# Patient Record
Sex: Female | Born: 1955
Health system: Southern US, Community
[De-identification: ages and names within clinical notes are randomized; demographics above are authoritative.]

## PROBLEM LIST (undated history)

## (undated) DIAGNOSIS — Z8619 Personal history of other infectious and parasitic diseases: Secondary | ICD-10-CM

## (undated) DIAGNOSIS — J449 Chronic obstructive pulmonary disease, unspecified: Secondary | ICD-10-CM

## (undated) DIAGNOSIS — J9611 Chronic respiratory failure with hypoxia: Secondary | ICD-10-CM

## (undated) DIAGNOSIS — E785 Hyperlipidemia, unspecified: Secondary | ICD-10-CM

## (undated) DIAGNOSIS — N200 Calculus of kidney: Secondary | ICD-10-CM

## (undated) DIAGNOSIS — F17210 Nicotine dependence, cigarettes, uncomplicated: Secondary | ICD-10-CM

## (undated) DIAGNOSIS — R233 Spontaneous ecchymoses: Secondary | ICD-10-CM

## (undated) DIAGNOSIS — Z9981 Dependence on supplemental oxygen: Secondary | ICD-10-CM

## (undated) DIAGNOSIS — F418 Other specified anxiety disorders: Secondary | ICD-10-CM

## (undated) DIAGNOSIS — I272 Pulmonary hypertension, unspecified: Secondary | ICD-10-CM

## (undated) DIAGNOSIS — J9811 Atelectasis: Secondary | ICD-10-CM

## (undated) DIAGNOSIS — K219 Gastro-esophageal reflux disease without esophagitis: Secondary | ICD-10-CM

## (undated) DIAGNOSIS — N809 Endometriosis, unspecified: Secondary | ICD-10-CM

## (undated) DIAGNOSIS — I5189 Other ill-defined heart diseases: Secondary | ICD-10-CM

## (undated) DIAGNOSIS — E559 Vitamin D deficiency, unspecified: Secondary | ICD-10-CM

## (undated) HISTORY — DX: Gastro-esophageal reflux disease without esophagitis: K21.9

## (undated) HISTORY — PX: TONSILLECTOMY: SUR1361

## (undated) HISTORY — DX: Hyperlipidemia, unspecified: E78.5

## (undated) HISTORY — PX: KIDNEY STONE SURGERY: SHX686

## (undated) HISTORY — DX: Endometriosis, unspecified: N80.9

---

## 2004-12-21 ENCOUNTER — Ambulatory Visit: Payer: Self-pay | Admitting: Internal Medicine

## 2005-03-13 ENCOUNTER — Emergency Department: Payer: Self-pay | Admitting: General Practice

## 2005-06-20 ENCOUNTER — Ambulatory Visit: Payer: Self-pay | Admitting: Urology

## 2005-09-18 ENCOUNTER — Other Ambulatory Visit: Payer: Self-pay

## 2005-09-18 ENCOUNTER — Ambulatory Visit: Payer: Self-pay | Admitting: Unknown Physician Specialty

## 2005-12-25 ENCOUNTER — Ambulatory Visit: Payer: Self-pay | Admitting: Urology

## 2007-01-20 ENCOUNTER — Ambulatory Visit: Payer: Self-pay | Admitting: Unknown Physician Specialty

## 2007-06-18 ENCOUNTER — Ambulatory Visit: Payer: Self-pay | Admitting: Urology

## 2007-06-25 ENCOUNTER — Ambulatory Visit: Payer: Self-pay | Admitting: Internal Medicine

## 2007-09-23 ENCOUNTER — Ambulatory Visit: Payer: Self-pay | Admitting: Urology

## 2008-06-20 ENCOUNTER — Ambulatory Visit: Payer: Self-pay | Admitting: Urology

## 2008-10-05 ENCOUNTER — Ambulatory Visit: Payer: Self-pay | Admitting: Urology

## 2008-12-12 ENCOUNTER — Ambulatory Visit: Payer: Self-pay | Admitting: General Surgery

## 2008-12-16 ENCOUNTER — Ambulatory Visit: Payer: Self-pay | Admitting: General Surgery

## 2010-03-07 ENCOUNTER — Ambulatory Visit: Payer: Self-pay | Admitting: Internal Medicine

## 2011-11-12 ENCOUNTER — Ambulatory Visit: Payer: Self-pay | Admitting: Internal Medicine

## 2012-06-02 DIAGNOSIS — I10 Essential (primary) hypertension: Secondary | ICD-10-CM | POA: Insufficient documentation

## 2013-07-02 ENCOUNTER — Ambulatory Visit: Payer: Self-pay | Admitting: Cardiology

## 2013-12-02 ENCOUNTER — Ambulatory Visit: Payer: Self-pay | Admitting: Internal Medicine

## 2014-03-08 DIAGNOSIS — E785 Hyperlipidemia, unspecified: Secondary | ICD-10-CM | POA: Insufficient documentation

## 2014-03-08 DIAGNOSIS — J45909 Unspecified asthma, uncomplicated: Secondary | ICD-10-CM | POA: Insufficient documentation

## 2014-03-08 DIAGNOSIS — I272 Pulmonary hypertension, unspecified: Secondary | ICD-10-CM | POA: Insufficient documentation

## 2014-03-08 HISTORY — DX: Unspecified asthma, uncomplicated: J45.909

## 2014-12-13 ENCOUNTER — Encounter: Payer: Self-pay | Admitting: Internal Medicine

## 2014-12-26 ENCOUNTER — Encounter: Payer: Self-pay | Admitting: Internal Medicine

## 2015-01-16 ENCOUNTER — Ambulatory Visit: Payer: Self-pay | Admitting: Physician Assistant

## 2015-01-18 DIAGNOSIS — R079 Chest pain, unspecified: Secondary | ICD-10-CM | POA: Insufficient documentation

## 2015-01-18 DIAGNOSIS — R0602 Shortness of breath: Secondary | ICD-10-CM | POA: Insufficient documentation

## 2015-01-24 ENCOUNTER — Encounter
Admit: 2015-01-24 | Disposition: A | Discharge status: Home / Self Care | Payer: Self-pay | Attending: Internal Medicine | Admitting: Internal Medicine

## 2015-01-27 ENCOUNTER — Ambulatory Visit: Payer: Self-pay | Admitting: Internal Medicine

## 2015-02-09 ENCOUNTER — Ambulatory Visit: Payer: Self-pay | Admitting: Internal Medicine

## 2015-02-24 ENCOUNTER — Encounter
Admit: 2015-02-24 | Disposition: A | Discharge status: Home / Self Care | Payer: Self-pay | Attending: Internal Medicine | Admitting: Internal Medicine

## 2015-03-27 ENCOUNTER — Ambulatory Visit: Payer: Self-pay

## 2015-03-29 ENCOUNTER — Ambulatory Visit: Payer: Self-pay

## 2015-03-31 ENCOUNTER — Ambulatory Visit: Payer: Self-pay

## 2015-04-03 ENCOUNTER — Ambulatory Visit: Payer: Self-pay

## 2015-04-05 ENCOUNTER — Ambulatory Visit: Payer: Self-pay

## 2015-04-07 ENCOUNTER — Ambulatory Visit: Payer: Self-pay

## 2015-04-10 ENCOUNTER — Ambulatory Visit: Payer: Self-pay

## 2015-04-10 ENCOUNTER — Telehealth: Payer: Self-pay

## 2015-04-10 NOTE — Telephone Encounter (Signed)
Home phone is disconnected, voicemail is full on cell phone and cannot leave message.

## 2015-04-11 NOTE — Telephone Encounter (Signed)
Returned call from Ms Weissberg, she has multiple physician appointments and will be out a few weeks, but does want her account open and plans to return to [place].

## 2015-04-12 ENCOUNTER — Ambulatory Visit: Payer: Self-pay

## 2015-04-14 ENCOUNTER — Ambulatory Visit: Payer: Self-pay

## 2015-04-17 ENCOUNTER — Ambulatory Visit: Payer: Self-pay

## 2015-04-17 ENCOUNTER — Encounter: Payer: Self-pay | Admitting: Respiratory Therapy

## 2015-04-17 DIAGNOSIS — J449 Chronic obstructive pulmonary disease, unspecified: Secondary | ICD-10-CM

## 2015-04-17 NOTE — Progress Notes (Signed)
Pulmonary Individual Treatment Plan  Patient Details  Name: Rachael Blankenship MRN: [id number] Date of Birth: 03/27/1956 Referring Provider:  No ref. provider found  Initial Encounter Date:    Visit Diagnosis: No diagnosis found.  Patient's Home Medications on Admission: No current outpatient prescriptions on file.  Past Medical History: No past medical history on file.  Tobacco Use: History  Smoking status   Not on file  Smokeless tobacco   Not on file    Labs: Recent Review Flowsheet Data    There is no flowsheet data to display.       ADL UCSD:    Pulmonary Function Assessment:   Exercise Target Goals:    Exercise Program Goal: Individual exercise prescription set with THRR, safety & activity barriers. Participant demonstrates ability to understand and report RPE using BORG scale, to self-measure pulse accurately, and to acknowledge the importance of the exercise prescription.  Exercise Prescription Goal: Starting with aerobic activity 30 plus minutes a day, 3 days per week for initial exercise prescription. Provide home exercise prescription and guidelines that participant acknowledges understanding prior to discharge.  Activity Barriers & Risk Stratification:   6 Minute Walk:   Initial Exercise Prescription:   Exercise Prescription Changes:   Discharge Exercise Prescription:    Nutrition:  Target Goals: Understanding of nutrition guidelines, daily intake of sodium 1500mg , cholesterol 200mg , calories 30% from fat and 7% or less from saturated fats, daily to have 5 or more servings of fruits and vegetables.  Biometrics:    Nutrition Therapy Plan and Nutrition Goals:   Nutrition Discharge: Rate Your Plate Scores:   Psychosocial: Target Goals: Acknowledge presence or absence of depression, maximize coping skills, provide positive support system. Participant is able to verbalize types and ability to use techniques and skills needed for  reducing stress and depression.  Initial Review & Psychosocial Screening:   Quality of Life Scores:   PHQ-9: Recent Review Flowsheet Data    There is no flowsheet data to display.      Psychosocial Evaluation and Intervention:   Psychosocial Re-Evaluation:  Education: Education Goals: Education classes will be provided on a weekly basis, covering required topics. Participant will state understanding/return demonstration of topics presented.  Learning Barriers/Preferences:   Education Topics: Initial Evaluation Education: - Verbal, written and demonstration of respiratory meds, RPE/PD scales, oximetry and breathing techniques. Instruction on use of nebulizers and MDIs: cleaning and proper use, rinsing mouth with steroid doses and importance of monitoring MDI activations.   General Nutrition Guidelines/Fats and Fiber: -Group instruction provided by verbal, written material, models and posters to present the general guidelines for heart healthy nutrition. Gives an explanation and review of dietary fats and fiber.   Controlling Sodium/Reading Food Labels: -Group verbal and written material supporting the discussion of sodium use in heart healthy nutrition. Review and explanation with models, verbal and written materials for utilization of the food label.   Exercise Physiology & Risk Factors: - Group verbal and written instruction with models to review the exercise physiology of the cardiovascular system and associated critical values. Details cardiovascular disease risk factors and the goals associated with each risk factor.   Aerobic Exercise & Resistance Training: - Gives group verbal and written discussion on the health impact of inactivity. On the components of aerobic and resistive training programs and the benefits of this training and how to safely progress through these programs.   Flexibility, Balance, General Exercise Guidelines: - Provides group verbal and  written instruction on the benefits of  flexibility and balance training programs. Provides general exercise guidelines with specific guidelines to those with heart or lung disease. Demonstration and skill practice provided.   Stress Management: - Provides group verbal and written instruction about the health risks of elevated stress, cause of high stress, and healthy ways to reduce stress.   Depression: - Provides group verbal and written instruction on the correlation between heart/lung disease and depressed mood, treatment options, and the stigmas associated with seeking treatment.   Exercise & Equipment Safety: - Individual verbal instruction and demonstration of equipment use and safety with use of the equipment.   Infection Prevention: - Provides verbal and written material to individual with discussion of infection control including proper hand washing and proper equipment cleaning during exercise session.   Falls Prevention: - Provides verbal and written material to individual with discussion of falls prevention and safety.   Diabetes: - Individual verbal and written instruction to review signs/symptoms of diabetes, desired ranges of glucose level fasting, after meals and with exercise. Advice that pre and post exercise glucose checks will be done for 3 sessions at entry of program.   Chronic Lung Diseases: - Group verbal and written instruction to review new updates, new respiratory medications, new advancements in procedures and treatments. Provide informative websites and "800" numbers of self-education.   Lung Procedures: - Group verbal and written instruction to describe testing methods done to diagnose lung disease. Review the outcome of test results. Describe the treatment choices: Pulmonary Function Tests, ABGs and oximetry.   Energy Conservation: - Provide group verbal and written instruction for methods to conserve energy, plan and organize activities. Instruct on  pacing techniques, use of adaptive equipment and posture/positioning to relieve shortness of breath.   Triggers: - Group verbal and written instruction to review types of environmental controls: home humidity, furnaces, filters, dust mite/pet prevention, HEPA vacuums. To discuss weather changes, air quality and the benefits of nasal washing.   Exacerbations: - Group verbal and written instruction to provide: warning signs, infection symptoms, calling MD promptly, preventive modes, and value of vaccinations. Review: effective airway clearance, coughing and/or vibration techniques. Create an Sports administrator.   Oxygen: - Individual and group verbal and written instruction on oxygen therapy. Includes supplement oxygen, available portable oxygen systems, continuous and intermittent flow rates, oxygen safety, concentrators, and Medicare reimbursement for oxygen.   Respiratory Medications: - Group verbal and written instruction to review medications for lung disease. Drug class, frequency, complications, importance of spacers, rinsing mouth after steroid MDI's, and proper cleaning methods for nebulizers.   AED/CPR: - Group verbal and written instruction with the use of models to demonstrate the basic use of the AED with the basic ABC's of resuscitation.   Breathing Retraining: - Provides individuals verbal and written instruction on purpose, frequency, and proper technique of diaphragmatic breathing and pursed-lipped breathing. Applies individual practice skills.   Anatomy and Physiology of the Lungs: - Group verbal and written instruction with the use of models to provide basic lung anatomy and physiology related to function, structure and complications of lung disease.   Heart Failure: - Group verbal and written instruction on the basics of heart failure: signs/symptoms, treatments, explanation of ejection fraction, enlarged heart and cardiomyopathy.   Sleep Apnea: - Individual verbal and  written instruction to review Obstructive Sleep Apnea. Review of risk factors, methods for diagnosing and types of masks and machines for OSA.   Anxiety: - Provides group, verbal and written instruction on the correlation between heart/lung disease and anxiety,  treatment options, and management of anxiety.   Relaxation: - Provides group, verbal and written instruction about the benefits of relaxation for patients with heart/lung disease. Also provides patients with examples of relaxation techniques.   Knowledge Questionnaire Score:   Personal Goals and Risk Factors at Admission:   Personal Goals and Risk Factors Review:    Personal Goals Discharge:    Comments: 30 day review; Ms Dubberly has been ill and has several physician appointments; she plans to be back to [facility] soon. Last attended 03/15/2015.

## 2015-04-19 ENCOUNTER — Ambulatory Visit: Payer: Self-pay

## 2015-04-21 ENCOUNTER — Ambulatory Visit: Payer: Self-pay

## 2015-04-26 ENCOUNTER — Ambulatory Visit: Payer: Self-pay

## 2015-04-27 ENCOUNTER — Other Ambulatory Visit: Payer: Self-pay | Admitting: Internal Medicine

## 2015-04-27 DIAGNOSIS — J189 Pneumonia, unspecified organism: Secondary | ICD-10-CM

## 2015-04-28 ENCOUNTER — Ambulatory Visit: Payer: Self-pay

## 2015-05-01 ENCOUNTER — Ambulatory Visit: Payer: Self-pay

## 2015-05-03 ENCOUNTER — Ambulatory Visit: Payer: Self-pay

## 2015-05-05 ENCOUNTER — Ambulatory Visit: Payer: Self-pay

## 2015-05-08 ENCOUNTER — Ambulatory Visit: Payer: Self-pay

## 2015-05-08 NOTE — Progress Notes (Signed)
Attempted to call, voicemail full on phone.

## 2015-05-10 ENCOUNTER — Ambulatory Visit: Payer: Self-pay

## 2015-05-12 ENCOUNTER — Ambulatory Visit: Payer: Self-pay

## 2015-05-15 ENCOUNTER — Encounter: Payer: Self-pay | Admitting: Internal Medicine

## 2015-05-15 ENCOUNTER — Ambulatory Visit: Payer: Self-pay

## 2015-05-15 DIAGNOSIS — J449 Chronic obstructive pulmonary disease, unspecified: Secondary | ICD-10-CM

## 2015-05-15 NOTE — Progress Notes (Signed)
Pulmonary Individual Treatment Plan  Patient Details  Name: Rachael Blankenship MRN: [id number] Date of Birth: 11/04/1956 Referring Provider:  Dr Devona Konig  Initial Encounter Date: 12/13/14  Visit Diagnosis: COPD  Patient's Home Medications on Admission: No current outpatient prescriptions on file.  Past Medical History: No past medical history on file.  Tobacco Use: History  Smoking status   Not on file  Smokeless tobacco   Not on file    Labs: Recent Review Flowsheet Data    There is no flowsheet data to display.       ADL UCSD:    Pulmonary Function Assessment:   Exercise Target Goals:    Exercise Program Goal: Individual exercise prescription set with THRR, safety & activity barriers. Participant demonstrates ability to understand and report RPE using BORG scale, to self-measure pulse accurately, and to acknowledge the importance of the exercise prescription.  Exercise Prescription Goal: Starting with aerobic activity 30 plus minutes a day, 3 days per week for initial exercise prescription. Provide home exercise prescription and guidelines that participant acknowledges understanding prior to discharge.  Activity Barriers & Risk Stratification:   6 Minute Walk:   Initial Exercise Prescription:   Exercise Prescription Changes:   Discharge Exercise Prescription (Final Exercise Prescription Changes):    Nutrition:  Target Goals: Understanding of nutrition guidelines, daily intake of sodium 1500mg , cholesterol 200mg , calories 30% from fat and 7% or less from saturated fats, daily to have 5 or more servings of fruits and vegetables.  Biometrics:    Nutrition Therapy Plan and Nutrition Goals:   Nutrition Discharge: Rate Your Plate Scores:   Psychosocial: Target Goals: Acknowledge presence or absence of depression, maximize coping skills, provide positive support system. Participant is able to verbalize types and ability to use techniques and  skills needed for reducing stress and depression.  Initial Review & Psychosocial Screening:   Quality of Life Scores:   PHQ-9: Recent Review Flowsheet Data    There is no flowsheet data to display.      Psychosocial Evaluation and Intervention:   Psychosocial Re-Evaluation:  Education: Education Goals: Education classes will be provided on a weekly basis, covering required topics. Participant will state understanding/return demonstration of topics presented.  Learning Barriers/Preferences:   Education Topics: Initial Evaluation Education: - Verbal, written and demonstration of respiratory meds, RPE/PD scales, oximetry and breathing techniques. Instruction on use of nebulizers and MDIs: cleaning and proper use, rinsing mouth with steroid doses and importance of monitoring MDI activations.   General Nutrition Guidelines/Fats and Fiber: -Group instruction provided by verbal, written material, models and posters to present the general guidelines for heart healthy nutrition. Gives an explanation and review of dietary fats and fiber.   Controlling Sodium/Reading Food Labels: -Group verbal and written material supporting the discussion of sodium use in heart healthy nutrition. Review and explanation with models, verbal and written materials for utilization of the food label.   Exercise Physiology & Risk Factors: - Group verbal and written instruction with models to review the exercise physiology of the cardiovascular system and associated critical values. Details cardiovascular disease risk factors and the goals associated with each risk factor.   Aerobic Exercise & Resistance Training: - Gives group verbal and written discussion on the health impact of inactivity. On the components of aerobic and resistive training programs and the benefits of this training and how to safely progress through these programs.   Flexibility, Balance, General Exercise Guidelines: - Provides group  verbal and written instruction on the benefits of  flexibility and balance training programs. Provides general exercise guidelines with specific guidelines to those with heart or lung disease. Demonstration and skill practice provided.   Stress Management: - Provides group verbal and written instruction about the health risks of elevated stress, cause of high stress, and healthy ways to reduce stress.   Depression: - Provides group verbal and written instruction on the correlation between heart/lung disease and depressed mood, treatment options, and the stigmas associated with seeking treatment.   Exercise & Equipment Safety: - Individual verbal instruction and demonstration of equipment use and safety with use of the equipment.   Infection Prevention: - Provides verbal and written material to individual with discussion of infection control including proper hand washing and proper equipment cleaning during exercise session.   Falls Prevention: - Provides verbal and written material to individual with discussion of falls prevention and safety.   Diabetes: - Individual verbal and written instruction to review signs/symptoms of diabetes, desired ranges of glucose level fasting, after meals and with exercise. Advice that pre and post exercise glucose checks will be done for 3 sessions at entry of program.   Chronic Lung Diseases: - Group verbal and written instruction to review new updates, new respiratory medications, new advancements in procedures and treatments. Provide informative websites and "800" numbers of self-education.   Lung Procedures: - Group verbal and written instruction to describe testing methods done to diagnose lung disease. Review the outcome of test results. Describe the treatment choices: Pulmonary Function Tests, ABGs and oximetry.   Energy Conservation: - Provide group verbal and written instruction for methods to conserve energy, plan and organize activities.  Instruct on pacing techniques, use of adaptive equipment and posture/positioning to relieve shortness of breath.   Triggers: - Group verbal and written instruction to review types of environmental controls: home humidity, furnaces, filters, dust mite/pet prevention, HEPA vacuums. To discuss weather changes, air quality and the benefits of nasal washing.   Exacerbations: - Group verbal and written instruction to provide: warning signs, infection symptoms, calling MD promptly, preventive modes, and value of vaccinations. Review: effective airway clearance, coughing and/or vibration techniques. Create an Sports administrator.   Oxygen: - Individual and group verbal and written instruction on oxygen therapy. Includes supplement oxygen, available portable oxygen systems, continuous and intermittent flow rates, oxygen safety, concentrators, and Medicare reimbursement for oxygen.   Respiratory Medications: - Group verbal and written instruction to review medications for lung disease. Drug class, frequency, complications, importance of spacers, rinsing mouth after steroid MDI's, and proper cleaning methods for nebulizers.   AED/CPR: - Group verbal and written instruction with the use of models to demonstrate the basic use of the AED with the basic ABC's of resuscitation.   Breathing Retraining: - Provides individuals verbal and written instruction on purpose, frequency, and proper technique of diaphragmatic breathing and pursed-lipped breathing. Applies individual practice skills.   Anatomy and Physiology of the Lungs: - Group verbal and written instruction with the use of models to provide basic lung anatomy and physiology related to function, structure and complications of lung disease.   Heart Failure: - Group verbal and written instruction on the basics of heart failure: signs/symptoms, treatments, explanation of ejection fraction, enlarged heart and cardiomyopathy.   Sleep Apnea: - Individual  verbal and written instruction to review Obstructive Sleep Apnea. Review of risk factors, methods for diagnosing and types of masks and machines for OSA.   Anxiety: - Provides group, verbal and written instruction on the correlation between heart/lung disease and anxiety,  treatment options, and management of anxiety.   Relaxation: - Provides group, verbal and written instruction about the benefits of relaxation for patients with heart/lung disease. Also provides patients with examples of relaxation techniques.   Knowledge Questionnaire Score:   Personal Goals and Risk Factors at Admission:   Personal Goals and Risk Factors Review:    Personal Goals Discharge:    Comments: Ms Grulke has been out due to illness and other obligations; she last attended 03/15/15; she was called 05/08/15 with no answer and voicemail full.

## 2015-05-16 ENCOUNTER — Ambulatory Visit
Admission: RE | Admit: 2015-05-16 | Discharge: 2015-05-16 | Disposition: A | Discharge status: Home / Self Care | Payer: BLUE CROSS/BLUE SHIELD | Source: Ambulatory Visit | Attending: Internal Medicine | Admitting: Internal Medicine

## 2015-05-16 DIAGNOSIS — J189 Pneumonia, unspecified organism: Secondary | ICD-10-CM | POA: Insufficient documentation

## 2015-05-16 HISTORY — DX: Chronic obstructive pulmonary disease, unspecified: J44.9

## 2015-05-16 HISTORY — DX: Calculus of kidney: N20.0

## 2015-05-16 MED ORDER — IOHEXOL 300 MG/ML  SOLN
75.0000 mL | Freq: Once | INTRAMUSCULAR | Status: AC | PRN
  Administered 2015-05-16: 75 mL via INTRAVENOUS

## 2015-05-17 ENCOUNTER — Ambulatory Visit: Payer: Self-pay

## 2015-05-17 ENCOUNTER — Encounter: Payer: Self-pay | Admitting: Internal Medicine

## 2015-05-17 DIAGNOSIS — J449 Chronic obstructive pulmonary disease, unspecified: Secondary | ICD-10-CM

## 2015-05-17 NOTE — Progress Notes (Signed)
Discharge Summary  Patient Details  Name: Rachael Blankenship MRN: [id number] Date of Birth: [birth year] Referring Provider:  Dr. Devona Konig   Number of Visits: 12  Reason for Discharge:  Early Exit:  Rachael Blankenship called 05/16/15 and requested to be discharged from [facility]. She is caring for her parents and has multiple physician appointments. To continue exercise, she has purchased a Proform Elliptical.  Thank you for the opportunity to work with your patient, Rachael Rachael Blankenship.  Smoking History:  History  Smoking status   Not on file  Smokeless tobacco   Not on file    Diagnosis:  No diagnosis found.  ADL UCSD:     ADL UCSD      12/13/14 1130       ADL UCSD   ADL Phase Entry     SOB Score total 47     Rest 1     Walk 2     Stairs 4     Bath 2     Dress 2     Shop 3        Initial Exercise Prescription:   Discharge Exercise Prescription (Final Exercise Prescription Changes):     Exercise Prescription Changes - 05/17/15 0600    Exercise Review   Progression Yes   Response to Exercise   Blood Pressure (Admit) 118/60 mmHg  03/15/15   Blood Pressure (Exercise) 150/74 mmHg   Blood Pressure (Exit) 122/74 mmHg   Heart Rate (Admit) 87 bpm   Heart Rate (Exercise) 111 bpm   Heart Rate (Exit) 81 bpm   Oxygen Saturation (Admit) 94 %   Oxygen Saturation (Exercise) 96 %   Oxygen Saturation (Exit) 94 %   Rating of Perceived Exertion (Exercise) 13   Perceived Dyspnea (Exercise) 3   Resistance Training   Training Prescription Yes   Weight 3   Reps 10-12   Interval Training   Interval Training No   Treadmill   MPH 1.7   Grade 0   Minutes 15   NuStep   Level 4   Watts 70   Minutes 15   REL-XR   Level 4   Watts 80   Minutes 15      Functional Capacity:     6 Minute Walk      12/13/14 1130       6 Minute Walk   Phase Initial     Distance 1550 feet     Walk Time 6 minutes     Resting HR 70 bpm     Resting BP 124/70 mmHg     Max Ex. HR 88 bpm     Max Ex. BP 136/78 mmHg     RPE 13     Perceived Dyspnea  3        Psychological, QOL, Others - Outcomes: PHQ 2/9: No flowsheet data found.  Quality of Life:   Personal Goals: Goals established at orientation with interventions provided to work toward goal.     Personal Goals and Risk Factors at Admission - 05/17/15 0627    Personal Goals and Risk Factors on Admission    Weight Management Yes   Intervention Learn and follow the exercise and diet guidelines while in the program. Utilize the nutrition and education classes to help gain knowledge of the diet and exercise expectations in the program   Admit Weight 156 lb 9.6 oz (71.033 kg)   Goal Weight 125 lb (56.7 kg)   Increase Aerobic Exercise and Physical Activity  Yes   Intervention While in program, learn and follow the exercise prescription taught. Start at a low level workload and increase workload after able to maintain previous level for 30 minutes. Increase time before increasing intensity.   Understand more about Heart/Pulmonary Disease. Yes   Intervention While in program utilize professionals for any questions, and attend the education sessions. Great websites to use are www.americanheart.org or www.lung.org for reliable information.   Improve shortness of breath with ADL's Yes   Intervention While in program, learn and follow the exercise prescription taught. Start at a low level workload and increase workload ad advised by the exercise physiologist. Increase time before increasing intensity.   Develop more efficient breathing techniques such as purse lipped breathing and diaphragmatic breathing; and practicing self-pacing with activity Yes   Intervention While in program, learn and utilize the specific breathing techniques taught to you. Continue to practice and use the techniques as needed.   Increase knowledge of respiratory medications and ability to use respiratory devices properly.  Yes   Intervention While in program,  learn to administer MDI, nebulizer, and spacer properly.;Learn to take respiratory medicine as ordered.;While in program, learn to Clean MDI, nebulizers, and spacers properly.   Hypertension Yes       Personal Goals Discharge:     Personal Goals at Discharge - 05/17/15 0638    Weight Management   Comments Rachael Blankenship's entry weight was 156.6 and 154.8lb on her last exercise session. She preferred not to meet with the dietitian. She was eatting less candy and more nuts and protein bars.   Increase Aerobic Exercise and Physical Activity   Goals Progress/Improvement seen  Yes   Comments Rachael Blankenship increased her exercise goals on the XR, Treadmill, and NuStep and increased her weights from 2lb to 3lb. She has bought a Proform Eliptical for home exercise.   Improve shortness of breath with ADL's   Goals Progress/Improvement seen  Yes   Comments Rachael Blankenship attended our educational sessions for management of respiratory disease.   Breathing Techniques   Goals Progress/Improvement seen  Yes   Comments Using PLB during exercise and home activities.   Increase knowledge of respiratory medications   Goals Progress/Improvement seen  Yes   Comments Rachael Blankenship has a good understanding of her Dulera, Combivent, Spiriva, and spacer.      Nutrition & Weight - Outcomes:    Nutrition:   Nutrition Discharge:   Education Questionnaire Score:

## 2015-05-17 NOTE — Progress Notes (Signed)
Pulmonary Individual Treatment Plan  Patient Details  Name: Rachael Blankenship MRN: [id number] Date of Birth: 05/21/1956 Referring Provider:  Dr. Devona Konig  Initial Encounter Date: 12/13/14  Visit Diagnosis: COPD, MODERATE  Patient's Home Medications on Admission: No current outpatient prescriptions on file.  Past Medical History: Past Medical History  Diagnosis Date   COPD (chronic obstructive pulmonary disease)    Kidney stones     Tobacco Use: History  Smoking status   Not on file  Smokeless tobacco   Not on file    Labs: Recent Review Flowsheet Data    There is no flowsheet data to display.       ADL [facility]:     ADL [facility]      12/13/14 1130       ADL [facility]   ADL Phase Entry     SOB Score total 47     Rest 1     Walk 2     Stairs 4     Bath 2     Dress 2     Shop 3         Pulmonary Function Assessment:   Exercise Target Goals:    Exercise Program Goal: Individual exercise prescription set with THRR, safety & activity barriers. Participant demonstrates ability to understand and report RPE using BORG scale, to self-measure pulse accurately, and to acknowledge the importance of the exercise prescription.  Exercise Prescription Goal: Starting with aerobic activity 30 plus minutes a day, 3 days per week for initial exercise prescription. Provide home exercise prescription and guidelines that participant acknowledges understanding prior to discharge.  Activity Barriers & Risk Stratification:   6 Minute Walk:     6 Minute Walk      12/13/14 1130       6 Minute Walk   Phase Initial     Distance 1550 feet     Walk Time 6 minutes     Resting HR 70 bpm     Resting BP 124/70 mmHg     Max Ex. HR 88 bpm     Max Ex. BP 136/78 mmHg     RPE 13     Perceived Dyspnea  3        Initial Exercise Prescription:   Exercise Prescription Changes:     Exercise Prescription Changes      05/17/15 0600           Exercise Review   Progression  Yes       Response to Exercise   Blood Pressure (Admit) 118/60 mmHg  03/15/15       Blood Pressure (Exercise) 150/74 mmHg       Blood Pressure (Exit) 122/74 mmHg       Heart Rate (Admit) 87 bpm       Heart Rate (Exercise) 111 bpm       Heart Rate (Exit) 81 bpm       Oxygen Saturation (Admit) 94 %       Oxygen Saturation (Exercise) 96 %       Oxygen Saturation (Exit) 94 %       Rating of Perceived Exertion (Exercise) 13       Perceived Dyspnea (Exercise) 3       Resistance Training   Training Prescription Yes       Weight 3       Reps 10-12       Interval Training   Interval Training No  Treadmill   MPH 1.7       Grade 0       Minutes 15       NuStep   Level 4       Watts 70       Minutes 15       REL-XR   Level 4       Watts 80       Minutes 15          Discharge Exercise Prescription (Final Exercise Prescription Changes):     Exercise Prescription Changes - 05/17/15 0600    Exercise Review   Progression Yes   Response to Exercise   Blood Pressure (Admit) 118/60 mmHg  03/15/15   Blood Pressure (Exercise) 150/74 mmHg   Blood Pressure (Exit) 122/74 mmHg   Heart Rate (Admit) 87 bpm   Heart Rate (Exercise) 111 bpm   Heart Rate (Exit) 81 bpm   Oxygen Saturation (Admit) 94 %   Oxygen Saturation (Exercise) 96 %   Oxygen Saturation (Exit) 94 %   Rating of Perceived Exertion (Exercise) 13   Perceived Dyspnea (Exercise) 3   Resistance Training   Training Prescription Yes   Weight 3   Reps 10-12   Interval Training   Interval Training No   Treadmill   MPH 1.7   Grade 0   Minutes 15   NuStep   Level 4   Watts 70   Minutes 15   REL-XR   Level 4   Watts 80   Minutes 15       Nutrition:  Target Goals: Understanding of nutrition guidelines, daily intake of sodium 1500mg , cholesterol 200mg , calories 30% from fat and 7% or less from saturated fats, daily to have 5 or more servings of fruits and vegetables.  Biometrics:    Nutrition Therapy Plan  and Nutrition Goals:   Nutrition Discharge: Rate Your Plate Scores:   Psychosocial: Target Goals: Acknowledge presence or absence of depression, maximize coping skills, provide positive support system. Participant is able to verbalize types and ability to use techniques and skills needed for reducing stress and depression.  Initial Review & Psychosocial Screening:   Quality of Life Scores:   PHQ-9:     Recent Review Flowsheet Data    There is no flowsheet data to display.      Psychosocial Evaluation and Intervention:     Psychosocial Evaluation - 05/17/15 4496    Psychosocial Evaluation & Interventions   Interventions Physician referral      Psychosocial Re-Evaluation:  Education: Education Goals: Education classes will be provided on a weekly basis, covering required topics. Participant will state understanding/return demonstration of topics presented.  Learning Barriers/Preferences:   Education Topics: Initial Evaluation Education: - Verbal, written and demonstration of respiratory meds, RPE/PD scales, oximetry and breathing techniques. Instruction on use of nebulizers and MDIs: cleaning and proper use, rinsing mouth with steroid doses and importance of monitoring MDI activations.          Most Recent Value   Date  12/13/14   Educator  LB   Instruction Review Code  2- meets goals/outcomes      General Nutrition Guidelines/Fats and Fiber: -Group instruction provided by verbal, written material, models and posters to present the general guidelines for heart healthy nutrition. Gives an explanation and review of dietary fats and fiber.   Controlling Sodium/Reading Food Labels: -Group verbal and written material supporting the discussion of sodium use in heart healthy nutrition. Review and explanation with  models, verbal and written materials for utilization of the food label.   Exercise Physiology & Risk Factors: - Group verbal and written instruction with  models to review the exercise physiology of the cardiovascular system and associated critical values. Details cardiovascular disease risk factors and the goals associated with each risk factor.      Most Recent Value   Date  03/15/15   Educator  SW   Instruction Review Code  2- meets goals/outcomes [2]      Aerobic Exercise & Resistance Training: - Gives group verbal and written discussion on the health impact of inactivity. On the components of aerobic and resistive training programs and the benefits of this training and how to safely progress through these programs.      Most Recent Value   Date  12/21/14   Educator  SW   Instruction Review Code  2- meets goals/outcomes      Flexibility, Balance, General Exercise Guidelines: - Provides group verbal and written instruction on the benefits of flexibility and balance training programs. Provides general exercise guidelines with specific guidelines to those with heart or lung disease. Demonstration and skill practice provided.      Most Recent Value   Date  12/28/14   Educator  SW   Instruction Review Code  2- meets goals/outcomes      Stress Management: - Provides group verbal and written instruction about the health risks of elevated stress, cause of high stress, and healthy ways to reduce stress.   Depression: - Provides group verbal and written instruction on the correlation between heart/lung disease and depressed mood, treatment options, and the stigmas associated with seeking treatment.   Exercise & Equipment Safety: - Individual verbal instruction and demonstration of equipment use and safety with use of the equipment.      Most Recent Value   Date  12/19/14   Educator  LB   Instruction Review Code  2- meets goals/outcomes      Infection Prevention: - Provides verbal and written material to individual with discussion of infection control including proper hand washing and proper equipment cleaning during exercise  session.      Most Recent Value   Date  01/19/15   Educator  LB   Instruction Review Code  2- meets goals/outcomes      Falls Prevention: - Provides verbal and written material to individual with discussion of falls prevention and safety.      Most Recent Value   Date  12/13/14   Educator  LB   Instruction Review Code  2- meets goals/outcomes      Diabetes: - Individual verbal and written instruction to review signs/symptoms of diabetes, desired ranges of glucose level fasting, after meals and with exercise. Advice that pre and post exercise glucose checks will be done for 3 sessions at entry of program.   Chronic Lung Diseases: - Group verbal and written instruction to review new updates, new respiratory medications, new advancements in procedures and treatments. Provide informative websites and "800" numbers of self-education.   Lung Procedures: - Group verbal and written instruction to describe testing methods done to diagnose lung disease. Review the outcome of test results. Describe the treatment choices: Pulmonary Function Tests, ABGs and oximetry.      Most Recent Value   Date  01/06/15   Educator  SJ   Instruction Review Code  2- meets goals/outcomes      Energy Conservation: - Provide group verbal and written instruction for methods to conserve  energy, plan and organize activities. Instruct on pacing techniques, use of adaptive equipment and posture/positioning to relieve shortness of breath.      Most Recent Value   Date  01/11/15   Educator  SW   Instruction Review Code  2- meets goals/outcomes      Triggers: - Group verbal and written instruction to review types of environmental controls: home humidity, furnaces, filters, dust mite/pet prevention, HEPA vacuums. To discuss weather changes, air quality and the benefits of nasal washing.   Exacerbations: - Group verbal and written instruction to provide: warning signs, infection symptoms, calling MD promptly,  preventive modes, and value of vaccinations. Review: effective airway clearance, coughing and/or vibration techniques. Create an Sports administrator.   Oxygen: - Individual and group verbal and written instruction on oxygen therapy. Includes supplement oxygen, available portable oxygen systems, continuous and intermittent flow rates, oxygen safety, concentrators, and Medicare reimbursement for oxygen.   Respiratory Medications: - Group verbal and written instruction to review medications for lung disease. Drug class, frequency, complications, importance of spacers, rinsing mouth after steroid MDI's, and proper cleaning methods for nebulizers.      Most Recent Value   Date  12/13/14   Educator  LB   Instruction Review Code  2- meets goals/outcomes      AED/CPR: - Group verbal and written instruction with the use of models to demonstrate the basic use of the AED with the basic ABC's of resuscitation.   Breathing Retraining: - Provides individuals verbal and written instruction on purpose, frequency, and proper technique of diaphragmatic breathing and pursed-lipped breathing. Applies individual practice skills.      Most Recent Value   Date  12/13/14   Educator  LB   Instruction Review Code  2- meets goals/outcomes      Anatomy and Physiology of the Lungs: - Group verbal and written instruction with the use of models to provide basic lung anatomy and physiology related to function, structure and complications of lung disease.   Heart Failure: - Group verbal and written instruction on the basics of heart failure: signs/symptoms, treatments, explanation of ejection fraction, enlarged heart and cardiomyopathy.      Most Recent Value   Date  12/30/14   Educator  CE   Instruction Review Code  2- meets goals/outcomes      Sleep Apnea: - Individual verbal and written instruction to review Obstructive Sleep Apnea. Review of risk factors, methods for diagnosing and types of masks and machines  for OSA.   Anxiety: - Provides group, verbal and written instruction on the correlation between heart/lung disease and anxiety, treatment options, and management of anxiety.   Relaxation: - Provides group, verbal and written instruction about the benefits of relaxation for patients with heart/lung disease. Also provides patients with examples of relaxation techniques.      Most Recent Value   Date  01/04/15   Educator  [facility]   Instruction Review Code  2- Meets goals/outcomes      Knowledge Questionnaire Score:   Personal Goals and Risk Factors at Admission:     Personal Goals and Risk Factors at Admission - 05/17/15 0627    Personal Goals and Risk Factors on Admission    Weight Management Yes   Intervention Learn and follow the exercise and diet guidelines while in the program. Utilize the nutrition and education classes to help gain knowledge of the diet and exercise expectations in the program   Admit Weight 156 lb 9.6 oz (71.033 kg)  Goal Weight 125 lb (56.7 kg)   Increase Aerobic Exercise and Physical Activity Yes   Intervention While in program, learn and follow the exercise prescription taught. Start at a low level workload and increase workload after able to maintain previous level for 30 minutes. Increase time before increasing intensity.   Understand more about Heart/Pulmonary Disease. Yes   Intervention While in program utilize professionals for any questions, and attend the education sessions. Great websites to use are www.americanheart.org or www.lung.org for reliable information.   Improve shortness of breath with ADL's Yes   Intervention While in program, learn and follow the exercise prescription taught. Start at a low level workload and increase workload ad advised by the exercise physiologist. Increase time before increasing intensity.   Develop more efficient breathing techniques such as purse lipped breathing and diaphragmatic breathing; and practicing self-pacing  with activity Yes   Intervention While in program, learn and utilize the specific breathing techniques taught to you. Continue to practice and use the techniques as needed.   Increase knowledge of respiratory medications and ability to use respiratory devices properly.  Yes   Intervention While in program, learn to administer MDI, nebulizer, and spacer properly.;Learn to take respiratory medicine as ordered.;While in program, learn to Clean MDI, nebulizers, and spacers properly.   Hypertension Yes      Personal Goals and Risk Factors Review:    Personal Goals Discharge:      Personal Goals at Discharge - 05/17/15 0638    Weight Management   Comments Ms Goldinger's entry weight was 156.6 and 154.8lb on her last exercise session. She preferred not to meet with the dietitian. She was eatting less candy and more nuts and protein bars.   Increase Aerobic Exercise and Physical Activity   Goals Progress/Improvement seen  Yes   Comments Ms Wampole increased her exercise goals on the XR, Treadmill, and NuStep and increased her weights from 2lb to 3lb. She has bought a Proform Eliptical for home exercise.   Improve shortness of breath with ADL's   Goals Progress/Improvement seen  Yes   Comments Ms Moscoso attended our educational sessions for management of respiratory disease.   Breathing Techniques   Goals Progress/Improvement seen  Yes   Comments Using PLB during exercise and home activities.   Increase knowledge of respiratory medications   Goals Progress/Improvement seen  Yes   Comments Ms Lovins has a good understanding of her Dulera, Combivent, Spiriva, and spacer.      Comments: Early Discharge

## 2015-05-19 ENCOUNTER — Ambulatory Visit: Payer: Self-pay

## 2015-05-22 ENCOUNTER — Ambulatory Visit: Payer: Self-pay

## 2015-05-24 ENCOUNTER — Ambulatory Visit: Payer: Self-pay

## 2015-05-26 ENCOUNTER — Ambulatory Visit: Payer: Self-pay

## 2015-10-12 ENCOUNTER — Ambulatory Visit
Admission: RE | Admit: 2015-10-12 | Discharge: 2015-10-12 | Disposition: A | Discharge status: Home / Self Care | Payer: BLUE CROSS/BLUE SHIELD | Source: Ambulatory Visit | Attending: Internal Medicine | Admitting: Internal Medicine

## 2015-10-12 ENCOUNTER — Ambulatory Visit
Admission: RE | Admit: 2015-10-12 | Discharge: 2015-10-12 | Disposition: A | Discharge status: Home / Self Care | Payer: BLUE CROSS/BLUE SHIELD | Source: Ambulatory Visit | Attending: Physician Assistant | Admitting: Physician Assistant

## 2015-10-12 ENCOUNTER — Other Ambulatory Visit: Payer: Self-pay | Admitting: Internal Medicine

## 2015-10-12 DIAGNOSIS — R05 Cough: Secondary | ICD-10-CM | POA: Diagnosis present

## 2015-10-12 DIAGNOSIS — R059 Cough, unspecified: Secondary | ICD-10-CM

## 2015-10-12 DIAGNOSIS — J449 Chronic obstructive pulmonary disease, unspecified: Secondary | ICD-10-CM | POA: Diagnosis not present

## 2016-06-18 ENCOUNTER — Emergency Department
Admission: EM | Admit: 2016-06-18 | Discharge: 2016-06-18 | Disposition: A | Discharge status: Home / Self Care | Payer: 59 | Attending: Emergency Medicine | Admitting: Emergency Medicine

## 2016-06-18 ENCOUNTER — Ambulatory Visit (INDEPENDENT_AMBULATORY_CARE_PROVIDER_SITE_OTHER)
Admission: EM | Admit: 2016-06-18 | Discharge: 2016-06-18 | Disposition: A | Discharge status: Other Acute Inpatient Hospital | Payer: 59 | Source: Home / Self Care | Attending: Family Medicine | Admitting: Family Medicine

## 2016-06-18 ENCOUNTER — Emergency Department: Payer: 59

## 2016-06-18 ENCOUNTER — Encounter: Payer: Self-pay | Admitting: Emergency Medicine

## 2016-06-18 ENCOUNTER — Encounter: Payer: Self-pay | Admitting: Gynecology

## 2016-06-18 DIAGNOSIS — R079 Chest pain, unspecified: Secondary | ICD-10-CM | POA: Insufficient documentation

## 2016-06-18 DIAGNOSIS — F17210 Nicotine dependence, cigarettes, uncomplicated: Secondary | ICD-10-CM | POA: Insufficient documentation

## 2016-06-18 DIAGNOSIS — Z9889 Other specified postprocedural states: Secondary | ICD-10-CM | POA: Insufficient documentation

## 2016-06-18 DIAGNOSIS — J449 Chronic obstructive pulmonary disease, unspecified: Secondary | ICD-10-CM | POA: Insufficient documentation

## 2016-06-18 DIAGNOSIS — Z7982 Long term (current) use of aspirin: Secondary | ICD-10-CM | POA: Insufficient documentation

## 2016-06-18 DIAGNOSIS — Z8679 Personal history of other diseases of the circulatory system: Secondary | ICD-10-CM | POA: Insufficient documentation

## 2016-06-18 DIAGNOSIS — Z79899 Other long term (current) drug therapy: Secondary | ICD-10-CM | POA: Diagnosis not present

## 2016-06-18 LAB — CBC
HCT: 45.2 % (ref 35.0–47.0)
Hemoglobin: 15.1 g/dL (ref 12.0–16.0)
MCH: 27.9 pg (ref 26.0–34.0)
MCHC: 33.3 g/dL (ref 32.0–36.0)
MCV: 83.5 fL (ref 80.0–100.0)
PLATELETS: 294 10*3/uL (ref 150–440)
RBC: 5.42 MIL/uL — AB (ref 3.80–5.20)
RDW: 15.1 % — ABNORMAL HIGH (ref 11.5–14.5)
WBC: 11.9 10*3/uL — AB (ref 3.6–11.0)

## 2016-06-18 LAB — BASIC METABOLIC PANEL
Anion gap: 9 (ref 5–15)
BUN: 14 mg/dL (ref 6–20)
CALCIUM: 9.5 mg/dL (ref 8.9–10.3)
CO2: 28 mmol/L (ref 22–32)
CREATININE: 0.76 mg/dL (ref 0.44–1.00)
Chloride: 103 mmol/L (ref 101–111)
GFR calc non Af Amer: >60 mL/min (ref >60)
Glucose, Bld: 101 mg/dL — ABNORMAL HIGH (ref 65–99)
Potassium: 4.2 mmol/L (ref 3.5–5.1)
SODIUM: 140 mmol/L (ref 135–145)

## 2016-06-18 LAB — TROPONIN I: Troponin I: <0.03 ng/mL (ref <0.03)

## 2016-06-18 NOTE — ED Provider Notes (Signed)
[facility] ____________________________________________  Time seen: Approximately 5:52 PM  I have reviewed the triage vital signs and the nursing notes.   HISTORY  Chief Complaint Chest Pain  HPI Rachael Blankenship is a 60 y.o. female presents with husband at bedside for the complaints of chest pain. Patient reports approximately 1 hour prior to arrival she had a sudden onset of mid sternal chest pain that radiated into her back. Patient reports gradual worsening of this pain since onset however now reports pain is slightly improved. Patient denies any aggravating or alleviating factors. Denies any known cause. Denies any trauma. Patient reports pain does not feel like an indigestion pain. Denies any recent sickness, cough, congestion or fevers. Patient reports that she did take 4 baby aspirin just prior to arrival.  Patient reports past medical history including COPD, kidney stones and pulmonary hypertension. Patient reports that she does follow with cardiology in regards to her pulmonary hypertension and had a cardiac cath last year by Dr. Ubaldo Glassing. Patient reports she currently takes Lasix for her pulmonary hypertension.  PCP: Humphrey Rolls Cardiologist: Ubaldo Glassing Pulmonologist: Humphrey Rolls   Past Medical History:  Diagnosis Date  . COPD (chronic obstructive pulmonary disease) ([place])   . Kidney stones     There are no active problems to display for this patient.   Past Surgical History:  Procedure Laterality Date  . KIDNEY STONE SURGERY    . TONSILLECTOMY       No current facility-administered medications for this encounter.   Current Outpatient Prescriptions:  .  alendronate (FOSAMAX) 70 MG tablet, Take 70 mg by mouth once a week. Take with a full glass of water on an empty stomach., Disp: , Rfl:  .  ALPRAZolam (XANAX) 0.25 MG tablet, Take 0.25 mg by mouth at bedtime as needed for anxiety., Disp: , Rfl:  .  ascorbic acid (VITAMIN C) 500 MG tablet, Take 500 mg by mouth daily., Disp: ,  Rfl:  .  aspirin 81 MG tablet, Take 81 mg by mouth daily., Disp: , Rfl:  .  cholecalciferol (VITAMIN D) 400 units TABS tablet, Take 1,000 Units by mouth., Disp: , Rfl:  .  citalopram (CELEXA) 20 MG tablet, Take 20 mg by mouth daily., Disp: , Rfl:  .  co-enzyme Q-10 50 MG capsule, Take 100 mg by mouth daily., Disp: , Rfl:  .  furosemide (LASIX) 40 MG tablet, Take 40 mg by mouth., Disp: , Rfl:  .  ibuprofen (ADVIL,MOTRIN) 800 MG tablet, Take 800 mg by mouth every 8 (eight) hours as needed., Disp: , Rfl:  .  Ipratropium-Albuterol (COMBIVENT RESPIMAT) 20-100 MCG/ACT AERS respimat, Inhale 1 puff into the lungs every 6 (six) hours., Disp: , Rfl:  .  Magnesium 250 MG TABS, Take by mouth., Disp: , Rfl:  .  mometasone (NASONEX) 50 MCG/ACT nasal spray, Place 2 sprays into the nose daily., Disp: , Rfl:  .  mometasone-formoterol (DULERA) 100-5 MCG/ACT AERO, Inhale 2 puffs into the lungs 2 (two) times daily., Disp: , Rfl:  .  montelukast (SINGULAIR) 10 MG tablet, Take 10 mg by mouth at bedtime., Disp: , Rfl:  .  Multiple Vitamin (MULTIVITAMIN) capsule, Take 1 capsule by mouth daily., Disp: , Rfl:  .  omeprazole (PRILOSEC) 40 MG capsule, Take 40 mg by mouth daily., Disp: , Rfl:  .  phentermine (ADIPEX-P) 37.5 MG tablet, Take 37.5 mg by mouth daily before breakfast., Disp: , Rfl:  .  potassium chloride (KLOR-CON) 8 MEQ tablet, Take 8 mEq by mouth daily.,  Disp: , Rfl:  .  simvastatin (ZOCOR) 20 MG tablet, Take 20 mg by mouth daily., Disp: , Rfl:  .  tiotropium (SPIRIVA) 18 MCG inhalation capsule, Place 18 mcg into inhaler and inhale daily., Disp: , Rfl:  .  vitamin B-12 (CYANOCOBALAMIN) 1000 MCG tablet, Take 1,000 mcg by mouth daily., Disp: , Rfl:  .  vitamin E 400 UNIT capsule, Take 400 Units by mouth daily., Disp: , Rfl:   Allergies Naproxen and Sulfa antibiotics  family history  Father: CAD, cardiac stents, MI Sister: Migraines  Social History Social History  Substance Use Topics  . Smoking status:  Current Every Day Smoker    Packs/day: 0.50    Types: Cigarettes  . Smokeless tobacco: Current User  . Alcohol use Yes    Review of Systems Constitutional: No fever/chills Eyes: No visual changes. ENT: No sore throat. Cardiovascular: positive chest pain. Respiratory: Denies shortness of breath. Gastrointestinal: No abdominal pain.  No nausea, no vomiting.  No diarrhea.  No constipation. Genitourinary: Negative for dysuria. Musculoskeletal: Negative for back pain. Skin: Negative for rash. Neurological: Negative for headaches, focal weakness or numbness.  10-point ROS otherwise negative.  ____________________________________________   PHYSICAL EXAM:  VITAL SIGNS: ED Triage Vitals  Enc Vitals Group     BP 06/18/16 1720 (!) 148/68     Pulse Rate 06/18/16 1736 90     Resp 06/18/16 1720 16     Temp 06/18/16 1720 98.2 F (36.8 C)     Temp Source 06/18/16 1720 Oral     SpO2 06/18/16 1720 94 %     Weight 06/18/16 1720 140 lb (63.5 kg)     Height 06/18/16 1720 5\' 4"  (1.626 m)     Head Circumference --      Peak Flow --      Pain Score --      Pain Loc --      Pain Edu? --      Excl. in [place]? --     Constitutional: Alert and oriented. Well appearing and in no acute distress. Eyes: Conjunctivae are normal. PERRL. EOMI. ENT      Head: Normocephalic and atraumatic.      Nose: No congestion/rhinnorhea.      Mouth/Throat: Mucous membranes are moist.Oropharynx non-erythematous. Neck: No stridor. Supple without meningismus.  Hematological/Lymphatic/Immunilogical: No cervical lymphadenopathy. Cardiovascular: Normal rate, regular rhythm. Grossly normal heart sounds.  Good peripheral circulation. Chest nontender to palpation.  Respiratory: Normal respiratory effort without tachypnea nor retractions. Breath sounds are clear and equal bilaterally. No wheezes/rales/rhonchi.. Gastrointestinal: Soft and nontender. No distention.  No CVA tenderness. Musculoskeletal:  Nontender with normal  range of motion in all extremities. No midline cervical, thoracic or lumbar tenderness to palpation. Bilateral pedal pulses equal and easily palpated.      Right lower leg:  No tenderness or edema.      Left lower leg:  No tenderness or edema.  Neurologic:  Normal speech and language. No gross focal neurologic deficits are appreciated. Speech is normal. No gait instability.  Skin:  Skin is warm, dry and intact. No rash noted. Psychiatric: Mood and affect are normal. Speech and behavior are normal. Patient exhibits appropriate insight and judgment   ___________________________________________   LABS (all labs ordered are listed, but only abnormal results are displayed)  Labs Reviewed - No data to display ____________________________________________   PROCEDURES .EKG Date/Time: 06/18/2016 6:53 PM Performed by: Marylene Land Authorized by: Norval Gable   ECG reviewed by ED Physician in the  absence of a cardiologist: yes   Previous ECG:    Previous ECG:  Compared to current   Comparison ECG info:  09/18/2005 Interpretation:    Interpretation: non-specific   Rate:    ECG rate:  76   ECG rate assessment: normal   Rhythm:    Rhythm: sinus rhythm   Comments:     Nonspecific ST and T wave abnormality    ______________________________________   INITIAL IMPRESSION / ASSESSMENT AND PLAN / ED COURSE  Pertinent labs & imaging results that were available during my care of the patient were reviewed by me and considered in my medical decision making (see chart for details).  Well appearing. Presents for acute onset of midsternal chest pain radiating in to her back. Denies history of similar. No aggravating or alleviating factors. Nonreproducible. Discussed in detail with patient and spouse concern for cardiac etiology, and recommend further evaluation in Emergency room at this time. Recommend EMS transfer, patient agrees to this. Patient stable at the time of EMS transfer. Report called  to [facility] RN charge nurse.   Discussed follow up with Primary care physician this week. Discussed follow up and return parameters including no resolution or any worsening concerns. Patient verbalized understanding and agreed to plan.   ____________________________________________   FINAL CLINICAL IMPRESSION(S) / ED DIAGNOSES  Final diagnoses:  Chest pain, unspecified chest pain type     New Prescriptions   No medications on file    Note: This dictation was prepared with Dragon dictation along with smaller phrase technology. Any transcriptional errors that result from this process are unintentional.    Clinical Course      Marylene Land, NP 06/18/16 1859

## 2016-06-18 NOTE — ED Triage Notes (Signed)
Pt to ed with c/o central to left sided chest pain that started about 2 hours ago.  Pt denies chest pain at this time.  Pt brought from [facility] for cp.  Pt with iv in place per ems in left ac 20g, sl.  Per ems pt has also had 324mg  po asa.

## 2016-06-18 NOTE — ED Provider Notes (Signed)
[facility] Emergency Department Provider Note   ____________________________________________  Time seen: Approximately [id number] PM  I have reviewed the triage vital signs and the nursing notes.   HISTORY  Chief Complaint Chest Pain   HPI Rachael Blankenship is a 60 y.o. female with a history of COPD as well as pulmonary hypertension and kidney stones who is presenting to the emergency department today with sudden onset chest pain at 4 PM this afternoon. She said that she was waiting to order adenopathy stay cast when she felt a stabbing pain just left of the midline of her chest. She says that the pain was severe initially but then slowly decreased over the course of 2 hours and she has been pain-free ever since. She denied any nausea or vomiting but says the pain radiated through to her back and one point. Said that at one point she also began to feel slightly diaphoretic. She denied any worsening of the pain with deep breathing. She continues to be pain-free at this time.Patient also says that she has been working in her house a lot lately doing dusting and lifting.   Past Medical History:  Diagnosis Date  . COPD (chronic obstructive pulmonary disease) ([place])   . Kidney stones     There are no active problems to display for this patient.   Past Surgical History:  Procedure Laterality Date  . KIDNEY STONE SURGERY    . TONSILLECTOMY      Current Outpatient Rx  . Order #: [id number] Class: Historical Med  . Order #: [id number] Class: Historical Med  . Order #: [id number] Class: Historical Med  . Order #: [id number] Class: Historical Med  . Order #: [id number] Class: Historical Med  . Order #: [id number] Class: Historical Med  . Order #: [id number] Class: Historical Med  . Order #: [id number] Class: Historical Med  . Order #: [id number] Class: Historical Med  . Order #: [id number] Class: Historical Med  . Order #: [id number] Class: Historical Med  . Order #: [id number] Class:  Historical Med  . Order #: [id number] Class: Historical Med  . Order #: [id number] Class: Historical Med  . Order #: [id number] Class: Historical Med  . Order #: [id number] Class: Historical Med  . Order #: [id number] Class: Historical Med  . Order #: [id number] Class: Historical Med  . Order #: [id number] Class: Historical Med  . Order #: [id number] Class: Historical Med  . Order #: [id number] Class: Historical Med  . Order #: [id number] Class: Historical Med    Allergies Naproxen and Sulfa antibiotics  History reviewed. No pertinent family history.  Social History Social History  Substance Use Topics  . Smoking status: Current Every Day Smoker    Packs/day: 0.50    Types: Cigarettes  . Smokeless tobacco: Never Used  . Alcohol use Yes    Review of Systems Constitutional: No fever/chills Eyes: No visual changes. ENT: No sore throat. Cardiovascular:  As above Respiratory: As above Gastrointestinal: No abdominal pain.  No nausea, no vomiting.  No diarrhea.  No constipation. Genitourinary: Negative for dysuria. Musculoskeletal: Negative for back pain. Skin: Negative for rash. Neurological: Negative for headaches, focal weakness or numbness.  10-point ROS otherwise negative.  ____________________________________________   PHYSICAL EXAM:  VITAL SIGNS: ED Triage Vitals  Enc Vitals Group     BP 06/18/16 2117 121/71     Pulse Rate 06/18/16 2117 60     Resp 06/18/16 1852 18     Temp --      Temp Source 06/18/16 1852 Oral     SpO2 06/18/16 2117 94 %  Weight 06/18/16 1852 140 lb (63.5 kg)     Height 06/18/16 1852 5\' 4"  (1.626 m)     Head Circumference --      Peak Flow --      Pain Score 06/18/16 1853 2     Pain Loc --      Pain Edu? --      Excl. in [place]? --     Constitutional: Alert and oriented. Well appearing and in no acute distress. Eyes: Conjunctivae are normal. PERRL. EOMI. Head: Atraumatic. Nose: No congestion/rhinnorhea. Mouth/Throat: Mucous membranes are moist.   Oropharynx non-erythematous. Neck: No stridor.   Cardiovascular: Normal rate, regular rhythm. Grossly normal heart sounds.  Good peripheral circulation with intact And equal bilateral radial as well as dorsalis pedis pulses. Chest pain is strongly reproducible at the costal sternal juncture on the left. Respiratory: Normal respiratory effort.  No retractions. Lungs CTAB. Gastrointestinal: Soft and nontender. No distention. No abdominal bruits. No CVA tenderness. Musculoskeletal: No lower extremity tenderness nor edema.  No joint effusions. Neurologic:  Normal speech and language. No gross focal neurologic deficits are appreciated.  Skin:  Skin is warm, dry and intact. No rash noted. Psychiatric: Mood and affect are normal. Speech and behavior are normal.  ____________________________________________   LABS (all labs ordered are listed, but only abnormal results are displayed)  Labs Reviewed  BASIC METABOLIC PANEL - Abnormal; Notable for the following:       Result Value   Glucose, Bld 101 (*)    All other components within normal limits  CBC - Abnormal; Notable for the following:    WBC 11.9 (*)    RBC 5.42 (*)    RDW 15.1 (*)    All other components within normal limits  TROPONIN I  TROPONIN I   ____________________________________________  EKG  ED ECG REPORT I, Doran Stabler, the attending physician, personally viewed and interpreted this ECG.   Date: 06/18/2016  EKG Time: 1850  Rate: 60  Rhythm: normal sinus rhythm  Axis: Normal  Intervals:none  ST&T Change: No ST segment elevation or depression. No abnormal T-wave inversion.  ____________________________________________  RADIOLOGY  CLINICAL DATA:  Left-sided chest pain for 2 hours, initial encounter EXAM: CHEST  2 VIEW COMPARISON:  10/12/2015 FINDINGS: Cardiac shadow is within normal limits. The lungs are hyperinflated but stable. No acute infiltrate or sizable effusion is seen. No bony abnormality is  noted. Stable aortic calcifications are seen. IMPRESSION: COPD without acute abnormality. Aortic atherosclerosis. Electronically Signed   By: Inez Catalina M.D.   On: 06/18/2016 19:25 ____________________________________________   PROCEDURES  Procedures  ____________________________________________   INITIAL IMPRESSION / ASSESSMENT AND PLAN / ED COURSE  Pertinent labs & imaging results that were available during my care of the patient were reviewed by me and considered in my medical decision making (see chart for details).  Suspected the pain is likely costochondritis. Very unlikely to be ACS because of very reassuring workup however I will repeat a troponin just for extra security. Also unlikely to be pulmonary bolus. Symptoms have completely resolved and the patient continues to be symptom free. No pleuritic chest pain.  ----------------------------------------- 11:24 PM on 06/18/2016 -----------------------------------------  Patient continues to be chest pain-free. Second troponin is undetectable. Will be discharged home. Recommended topical anti-inflammatory such as icy hot or BenGay. The patient as well as the family understand this plan and are willing to comply. She will also be following up with her cardiologist, Dr. Ubaldo Glassing.   Clinical Course  ____________________________________________   FINAL CLINICAL IMPRESSION(S) / ED DIAGNOSES  Chest pain.    NEW MEDICATIONS STARTED DURING THIS VISIT:  New Prescriptions   No medications on file     Note:  This document was prepared using Dragon voice recognition software and may include unintentional dictation errors.    Orbie Pyo, MD 06/18/16 [phone]

## 2017-10-27 ENCOUNTER — Ambulatory Visit
Admission: RE | Admit: 2017-10-27 | Discharge: 2017-10-27 | Disposition: A | Discharge status: Home / Self Care | Payer: BLUE CROSS/BLUE SHIELD | Source: Ambulatory Visit | Attending: Internal Medicine | Admitting: Internal Medicine

## 2017-10-27 ENCOUNTER — Other Ambulatory Visit: Payer: Self-pay | Admitting: Internal Medicine

## 2017-10-27 DIAGNOSIS — R05 Cough: Secondary | ICD-10-CM | POA: Insufficient documentation

## 2017-10-27 DIAGNOSIS — R059 Cough, unspecified: Secondary | ICD-10-CM

## 2017-11-10 ENCOUNTER — Encounter: Payer: Self-pay | Admitting: Internal Medicine

## 2017-11-10 ENCOUNTER — Ambulatory Visit: Payer: BLUE CROSS/BLUE SHIELD | Admitting: Internal Medicine

## 2017-11-10 VITALS — BP 106/64 | HR 74 | Resp 16 | Ht 64.0 in | Wt 130.4 lb

## 2017-11-10 DIAGNOSIS — F17219 Nicotine dependence, cigarettes, with unspecified nicotine-induced disorders: Secondary | ICD-10-CM | POA: Diagnosis not present

## 2017-11-10 DIAGNOSIS — J9611 Chronic respiratory failure with hypoxia: Secondary | ICD-10-CM

## 2017-11-10 DIAGNOSIS — J209 Acute bronchitis, unspecified: Secondary | ICD-10-CM | POA: Diagnosis not present

## 2017-11-10 DIAGNOSIS — J479 Bronchiectasis, uncomplicated: Secondary | ICD-10-CM | POA: Diagnosis not present

## 2017-11-10 DIAGNOSIS — J440 Chronic obstructive pulmonary disease with acute lower respiratory infection: Secondary | ICD-10-CM

## 2017-11-10 NOTE — Progress Notes (Signed)
.  sak

## 2017-11-10 NOTE — Progress Notes (Signed)
[facility] [place], [place] [id number]  Pulmonary Sleep Medicine  Office Visit Note  Patient Name: Rachael Blankenship DOB: Jun 24, 1956 MRN [id number]  Date of Service: 11/10/2017     Complaints/HPI:   She is doing better today.  Less cough and congestion less shortness of breath.  She has no chest pain or tightness noted.  No fevers or chills noted.  Last chest x-ray did not show any infiltrates.  No hemoptysis noted major sputum production. Unfortunately she is still smoking.  She states she had stopped however found some old cigarettes lying around and she began to smoke again  Current Medication: Outpatient Encounter Medications as of 11/10/2017  Medication Sig  . furosemide (LASIX) 40 MG tablet take 1 tablet by mouth once daily  . alendronate (FOSAMAX) 70 MG tablet Take 70 mg by mouth once a week. Take with a full glass of water on an empty stomach.  . ALPRAZolam (XANAX) 0.25 MG tablet Take 0.25 mg by mouth at bedtime as needed for anxiety.  . ALPRAZolam (XANAX) 0.25 MG tablet Take by mouth.  Marland Kitchen ascorbic acid (VITAMIN C) 500 MG tablet Take 500 mg by mouth daily.  Marland Kitchen aspirin 81 MG chewable tablet Chew by mouth.  Marland Kitchen aspirin 81 MG tablet Take 81 mg by mouth daily.  . cholecalciferol (VITAMIN D) 400 units TABS tablet Take 1,000 Units by mouth.  . Cholecalciferol (VITAMIN D-1000 MAX ST) 1000 units tablet Take by mouth.  . citalopram (CELEXA) 20 MG tablet Take 20 mg by mouth daily.  . citalopram (CELEXA) 20 MG tablet Take by mouth.  . co-enzyme Q-10 50 MG capsule Take 100 mg by mouth daily.  . Coenzyme Q10 (CO Q-10) 100 MG CAPS Take by mouth.  . furosemide (LASIX) 40 MG tablet Take 40 mg by mouth.  Marland Kitchen ibuprofen (ADVIL,MOTRIN) 800 MG tablet Take 800 mg by mouth every 8 (eight) hours as needed.  Marland Kitchen ibuprofen (ADVIL,MOTRIN) 800 MG tablet Take by mouth.  . Ipratropium-Albuterol (COMBIVENT RESPIMAT) 20-100 MCG/ACT AERS respimat Inhale 1 puff into the lungs every 6 (six)  hours.  . Magnesium 250 MG TABS Take by mouth.  Marland Kitchen MAGNESIUM PO Take by mouth.  . mometasone-formoterol (DULERA) 100-5 MCG/ACT AERO Inhale 2 puffs into the lungs 2 (two) times daily.  . montelukast (SINGULAIR) 10 MG tablet Take 10 mg by mouth at bedtime.  . Multiple Vitamin (MULTIVITAMIN) capsule Take 1 capsule by mouth daily.  Marland Kitchen omeprazole (PRILOSEC) 40 MG capsule Take 40 mg by mouth daily.  . potassium chloride (KLOR-CON) 8 MEQ tablet Take 8 mEq by mouth daily.  . Potassium Chloride CR (MICRO-K) 8 MEQ CPCR capsule CR TK 1 C PO Q DAY. MAY TK EXTRA 2-3 TIMES A WK  . simvastatin (ZOCOR) 20 MG tablet Take 20 mg by mouth daily.  . SOYBEAN PO Take by mouth.  . tiotropium (SPIRIVA) 18 MCG inhalation capsule Place 18 mcg into inhaler and inhale daily.  . vitamin B-12 (CYANOCOBALAMIN) 1000 MCG tablet Take 1,000 mcg by mouth daily.  . vitamin B-12 (CYANOCOBALAMIN) 1000 MCG tablet Take by mouth.  . vitamin C (ASCORBIC ACID) 500 MG tablet Take by mouth.  . vitamin E 400 UNIT capsule Take 400 Units by mouth daily.  . [DISCONTINUED] mometasone (NASONEX) 50 MCG/ACT nasal spray Place 2 sprays into the nose daily.  . [DISCONTINUED] phentermine (ADIPEX-P) 37.5 MG tablet Take 37.5 mg by mouth daily before breakfast.   No facility-administered encounter medications on file as of 11/10/2017.  Surgical History: Past Surgical History:  Procedure Laterality Date  . KIDNEY STONE SURGERY    . TONSILLECTOMY      Medical History: Past Medical History:  Diagnosis Date  . COPD (chronic obstructive pulmonary disease) ([place])   . Endometriosis   . GERD (gastroesophageal reflux disease)   . Hyperlipidemia   . Kidney stones     Family History: Family History  Problem Relation Age of Onset  . Hypertension Mother   . Hyperlipidemia Mother   . GER disease Mother   . Heart attack Father        23 yrs ago  . Hypertension Father   . Parkinson's disease Father   . GER disease Father     Social  History: Social History   Socioeconomic History  . Marital status: Married    Spouse name: Not on file  . Number of children: Not on file  . Years of education: Not on file  . Highest education level: Not on file  Social Needs  . Financial resource strain: Not on file  . Food insecurity - worry: Not on file  . Food insecurity - inability: Not on file  . Transportation needs - medical: Not on file  . Transportation needs - non-medical: Not on file  Occupational History  . Not on file  Tobacco Use  . Smoking status: Former Smoker    Packs/day: 0.50    Types: Cigarettes    Start date: 11/09/2017  . Smokeless tobacco: Never Used  Substance and Sexual Activity  . Alcohol use: Yes  . Drug use: No  . Sexual activity: Not on file  Other Topics Concern  . Not on file  Social History Narrative  . Not on file     ROS  General: (-) fever, (-) chills, (-) night sweats, (-) weakness, (-) changes in appetite. Skin: (-) rashes, (-) itching,. Eyes: (-) visual changes, (-) redness, (-) itching, (-) double or blurred vision. Nose and Sinuses: (-) nasal stuffiness or itchiness, (-) postnasal drip, (-) nosebleeds, (-) sinus trouble. Mouth and Throat: (-) sore throat, (-) hoarseness. Neck: (-) swollen glands, (-) enlarged thyroid, (-) neck pain. Respiratory: + cough, (-) bloody sputum, improved shortness of breath, (-) wheezing. Cardiovascular: (-) ankle swelling, (-) chest pain. Lymphatic: (-) lymph node enlargement, (-) lymph node tenderness. Neurologic: (-) numbness, (-) tingling,(-) dizziness. Psychiatric: (-) anxiety, (-) depression.  Vital Signs: Blood pressure 106/64, pulse 74, resp. rate 16, height 5\' 4"  (1.626 m), weight 130 lb 6.4 oz (59.1 kg), SpO2 93 %.  Examination: General Appearance: The patient is well-developed, well-nourished, and in no distress. Skin: Gross inspection of skin demonstrates no evidence of abnormality. Head: Patient's head is normocephalic, no gross  deformities. Eyes: no gross deformities noted. ENT: ears appear grossly normal. Nasopharynx appears to be normal. Neck: Supple. No thyromegaly. No LAD. Respiratory: Lungs are clear to auscultation with no adventitious sounds. Cardiovascular: Normal S1 and S2 without murmur or rub. Extremities: No cyanosis. pulses are equal. Neurologic: Alert and oriented. No involuntary movements.  LABS: No results found for this or any previous visit (from the past 2160 hour(s)).  Radiology: Dg Chest 2 View  Result Date: 10/27/2017 CLINICAL DATA:  Cough and chest congestion, 2 weeks duration. EXAM: CHEST  2 VIEW COMPARISON:  06/18/2016 FINDINGS: Heart size is normal. Aortic atherosclerosis as seen previously. Scattered areas of pulmonary scarring are present. No sign of active infiltrate, collapse or effusion. No acute bone finding. IMPRESSION: No active cardiopulmonary disease. Scattered areas of  pulmonary scarring similar to the previous exams. Electronically Signed   By: Nelson Chimes M.D.   On: 10/27/2017 15:07    No results found.  Dg Chest 2 View  Result Date: 10/27/2017 CLINICAL DATA:  Cough and chest congestion, 2 weeks duration. EXAM: CHEST  2 VIEW COMPARISON:  06/18/2016 FINDINGS: Heart size is normal. Aortic atherosclerosis as seen previously. Scattered areas of pulmonary scarring are present. No sign of active infiltrate, collapse or effusion. No acute bone finding. IMPRESSION: No active cardiopulmonary disease. Scattered areas of pulmonary scarring similar to the previous exams. Electronically Signed   By: Nelson Chimes M.D.   On: 10/27/2017 15:07      Assessment and Plan: There are no active problems to display for this patient.   1.   Chronic Respiratory failure with hypoxia She is doing better now.  She has been using her oxygen at nighttime as noted previously.   Have recommended that she be little bit more compliant with the oxygen therapy  2. COPD with exacerbation  seems to be  chronically improved  We will continue with present inhaler regimen Complete medications previously prescribed  3. Nicotine abuse ongoing   once again we discussed smoking cessation She says that she is going to make an effort try to stop smoking  4. Bronchiectasis  clinically stable the last chest x-ray  Did not show any kind of infiltrates   General Counseling: I have discussed the findings of the evaluation and examination with Neoma Laming.  I have also discussed any further diagnostic evaluation thatmay be needed or ordered today. Lisbeth verbalizes understanding of the findings of todays visit. We also reviewed her medications today and discussed drug interactions and side effects including but not limited excessive drowsiness and altered mental states. We also discussed that there is always a risk not just to her but also people around her. she has been encouraged to call the office with any questions or concerns that should arise related to todays visit.    Time spent:  35 min  I have personally obtained a history, examined the patient, evaluated laboratory and imaging results, formulated the assessment and plan and placed orders.    Allyne Gee, MD [facility] Pulmonary and Critical Care Sleep medicine

## 2017-11-10 NOTE — Patient Instructions (Signed)
Bronchiectasis Bronchiectasis is a condition in which the airways (bronchi) are damaged and widened. This makes it difficult for the lungs to get rid of mucus. As a result, mucus gathers in the airways, and this often leads to lung infections. Infection can cause inflammation in the airways, which may further weaken and damage the bronchi. What are the causes? Bronchiectasis may be present at birth (congenital) or may develop later in life. Sometimes there is no apparent cause. Some common causes include:  Cystic fibrosis.  Recurrent lung infections (such as pneumonia, tuberculosis, or fungal infections).  Foreign bodies or other blockages in the lungs.  Breathing in fluid, food, or other foreign objects (aspiration).  What are the signs or symptoms? Common symptoms include:  A daily cough that brings up mucus and lasts for more than 3 weeks.  Frequent lung infections (such as pneumonia, tuberculosis, or fungal infections).  Shortness of breath and wheezing.  Weakness and fatigue.  How is this diagnosed? Various tests may be done to help diagnose bronchiectasis. Tests may include:  Chest X-rays or CT scans.  Breathing tests to help determine how your lungs are working.  Sputum cultures to check for infection.  Blood tests and other tests to check for related diseases or causes, such as cystic fibrosis.  How is this treated? Treatment varies depending on the severity of the condition. Medicines may be given to loosen the mucus to be coughed up (expectorants), to relax the muscles of the air passages (bronchodilators), or to prevent or treat infections (antibiotics). Physical therapy methods may be recommended to help clear mucus from the lungs. For severe cases, surgery may be done to remove the affected part of the lung. Follow these instructions at home:  Get plenty of rest.  Only take over-the-counter or prescription medicines as directed by your health care provider. If  antibiotic medicines were prescribed, take them as directed. Finish them even if you start to feel better.  Avoid sedatives and antihistamines unless otherwise directed by your health care provider. These medicines tend to thicken the mucus in the lungs.  Perform any breathing exercises or techniques to clear the lungs as directed by your health care provider.  Drink enough fluids to keep your urine clear or pale yellow.  Consider using a cold steam vaporizer or humidifier in your room or home to help loosen secretions.  If the cough is worse at night, try sleeping in a semi-upright position in a recliner or using a couple of pillows.  Avoid cigarette smoke and lung irritants. If you smoke, quit.  Stay inside when pollution and ozone levels are high.  Stay current with vaccinations and immunizations.  Follow up with your health care provider as directed. Contact a health care provider if:  You cough up more thick, discolored mucus (sputum) that is yellow to green in color.  You have a fever or persistent symptoms for more than 2-3 days.  You cannot control your cough and are losing sleep. Get help right away if:  You cough up blood.  You have chest pain or increasing shortness of breath.  You have pain that is getting worse or is uncontrolled with medicines.  You have a fever and your symptoms suddenly get worse. This information is not intended to replace advice given to you by your health care provider. Make sure you discuss any questions you have with your health care provider. Document Released: 09/08/2007 Document Revised: 04/24/2016 Document Reviewed: 05/19/2013 Elsevier Interactive Patient Education  2017 [employer]   Inc.  

## 2017-11-16 ENCOUNTER — Other Ambulatory Visit: Payer: Self-pay | Admitting: Internal Medicine

## 2017-12-11 ENCOUNTER — Other Ambulatory Visit: Payer: Self-pay | Admitting: Internal Medicine

## 2018-02-17 ENCOUNTER — Other Ambulatory Visit: Payer: Self-pay

## 2018-02-17 ENCOUNTER — Telehealth: Payer: Self-pay

## 2018-02-17 MED ORDER — LEVOFLOXACIN 500 MG PO TABS: 500.0000 mg | ORAL_TABLET | Freq: Every day | ORAL | 0 refills | Status: DC

## 2018-02-17 NOTE — Telephone Encounter (Signed)
Pt left vm asking for abx for sinus pain, pressure, HA, face pain, sneezing, coughing.  Spoke to [facility] about symptoms and she approved rx for Levaquin to be sent to pharmacy for 10 days.  dbs

## 2018-02-19 ENCOUNTER — Other Ambulatory Visit: Payer: Self-pay | Admitting: Internal Medicine

## 2018-02-20 ENCOUNTER — Other Ambulatory Visit: Payer: Self-pay | Admitting: Nurse Practitioner

## 2018-02-23 ENCOUNTER — Other Ambulatory Visit: Payer: Self-pay

## 2018-02-23 MED ORDER — IPRATROPIUM-ALBUTEROL 0.5-2.5 (3) MG/3ML IN SOLN: RESPIRATORY_TRACT | 3 refills | Status: DC

## 2018-02-27 ENCOUNTER — Other Ambulatory Visit: Payer: Self-pay

## 2018-02-27 MED ORDER — POTASSIUM CHLORIDE ER 8 MEQ PO CPCR: ORAL_CAPSULE | ORAL | 1 refills | Status: DC

## 2018-03-03 ENCOUNTER — Other Ambulatory Visit: Payer: Self-pay | Admitting: Internal Medicine

## 2018-03-10 ENCOUNTER — Ambulatory Visit: Payer: Self-pay | Admitting: Nurse Practitioner

## 2018-03-16 ENCOUNTER — Ambulatory Visit: Payer: BLUE CROSS/BLUE SHIELD | Admitting: Internal Medicine

## 2018-03-16 ENCOUNTER — Encounter: Payer: Self-pay | Admitting: Internal Medicine

## 2018-03-16 VITALS — BP 132/68 | HR 82 | Resp 16 | Ht 64.0 in | Wt 131.0 lb

## 2018-03-16 DIAGNOSIS — R0602 Shortness of breath: Secondary | ICD-10-CM | POA: Diagnosis not present

## 2018-03-16 DIAGNOSIS — J449 Chronic obstructive pulmonary disease, unspecified: Secondary | ICD-10-CM | POA: Diagnosis not present

## 2018-03-16 DIAGNOSIS — J479 Bronchiectasis, uncomplicated: Secondary | ICD-10-CM

## 2018-03-16 DIAGNOSIS — F17219 Nicotine dependence, cigarettes, with unspecified nicotine-induced disorders: Secondary | ICD-10-CM

## 2018-03-16 DIAGNOSIS — J9611 Chronic respiratory failure with hypoxia: Secondary | ICD-10-CM | POA: Diagnosis not present

## 2018-03-16 NOTE — Progress Notes (Signed)
[facility] [place], [place] [address]  Pulmonary Sleep Medicine   Office Visit Note  Patient Name: Rachael Blankenship DOB: [birth year] MRN [id number]  Date of Service: 03/16/2018  Complaints/HPI:  She is doing little bit better.  She continues to smoke unfortunately.  Still has some exertional dyspnea noted.  She denies having any chest pain no cough at this time.  She does have wheezing noted.  No fevers are noted noted patient's to the hospital noted.  She is smoking approximately 8-10 cigarettes per day at this time.  Spirometry still showing severe COPD based on the latest numbers once again we discussed smoking cessation at length  ROS  General: (-) fever, (-) chills, (-) night sweats, (-) weakness Skin: (-) rashes, (-) itching,. Eyes: (-) visual changes, (-) redness, (-) itching. Nose and Sinuses: (-) nasal stuffiness or itchiness, (-) postnasal drip, (-) nosebleeds, (-) sinus trouble. Mouth and Throat: (-) sore throat, (-) hoarseness. Neck: (-) swollen glands, (-) enlarged thyroid, (-) neck pain. Respiratory: + cough, (-) bloody sputum, + shortness of breath, - wheezing. Cardiovascular: - ankle swelling, (-) chest pain. Lymphatic: (-) lymph node enlargement. Neurologic: (-) numbness, (-) tingling. Psychiatric: (-) anxiety, (-) depression   Current Medication: Outpatient Encounter Medications as of 03/16/2018  Medication Sig  . alendronate (FOSAMAX) 70 MG tablet TAKE 1 TABLET BY MOUTH EVERY WEEK  . ALPRAZolam (XANAX) 0.25 MG tablet Take 0.25 mg by mouth at bedtime as needed for anxiety.  . ALPRAZolam (XANAX) 0.25 MG tablet Take by mouth.  Marland Kitchen ascorbic acid (VITAMIN C) 500 MG tablet Take 500 mg by mouth daily.  Marland Kitchen aspirin 81 MG chewable tablet Chew by mouth.  Marland Kitchen aspirin 81 MG tablet Take 81 mg by mouth daily.  . cholecalciferol (VITAMIN D) 400 units TABS tablet Take 1,000 Units by mouth.  . Cholecalciferol (VITAMIN D-1000 MAX ST) 1000 units tablet Take  by mouth.  . citalopram (CELEXA) 20 MG tablet Take 20 mg by mouth daily.  . citalopram (CELEXA) 20 MG tablet Take by mouth.  . co-enzyme Q-10 50 MG capsule Take 100 mg by mouth daily.  . Coenzyme Q10 (CO Q-10) 100 MG CAPS Take by mouth.  . furosemide (LASIX) 40 MG tablet Take 40 mg by mouth.  . furosemide (LASIX) 40 MG tablet take 1 tablet by mouth once daily  . ibuprofen (ADVIL,MOTRIN) 800 MG tablet Take 800 mg by mouth every 8 (eight) hours as needed.  . Ipratropium-Albuterol (COMBIVENT RESPIMAT) 20-100 MCG/ACT AERS respimat Inhale 1 puff into the lungs every 6 (six) hours.  Marland Kitchen ipratropium-albuterol (DUONEB) 0.5-2.5 (3) MG/3ML SOLN Use 1 vial via nebulizer every 4 t0 6 hrs  . levofloxacin (LEVAQUIN) 500 MG tablet Take 1 tablet (500 mg total) by mouth daily. For 10 days  . Magnesium 250 MG TABS Take by mouth.  Marland Kitchen MAGNESIUM PO Take by mouth.  . mometasone-formoterol (DULERA) 100-5 MCG/ACT AERO Inhale 2 puffs into the lungs 2 (two) times daily.  . montelukast (SINGULAIR) 10 MG tablet TAKE 1 TABLET BY MOUTH EVERY DAY FOR RHINITIS OR ALLERGIES  . Multiple Vitamin (MULTIVITAMIN) capsule Take 1 capsule by mouth daily.  Marland Kitchen omeprazole (PRILOSEC) 40 MG capsule Take 40 mg by mouth daily.  . potassium chloride (KLOR-CON) 8 MEQ tablet Take 8 mEq by mouth daily.  . Potassium Chloride CR (MICRO-K) 8 MEQ CPCR capsule CR Take 1 cap po daily may take extra 2-3 times a week  . simvastatin (ZOCOR) 20 MG tablet Take 20  mg by mouth daily.  . SOYBEAN PO Take by mouth.  . tiotropium (SPIRIVA) 18 MCG inhalation capsule Place 18 mcg into inhaler and inhale daily.  . vitamin B-12 (CYANOCOBALAMIN) 1000 MCG tablet Take 1,000 mcg by mouth daily.  . vitamin B-12 (CYANOCOBALAMIN) 1000 MCG tablet Take by mouth.  . vitamin C (ASCORBIC ACID) 500 MG tablet Take by mouth.  . vitamin E 400 UNIT capsule Take 400 Units by mouth daily.   No facility-administered encounter medications on file as of 03/16/2018.     Surgical  History: Past Surgical History:  Procedure Laterality Date  . KIDNEY STONE SURGERY    . TONSILLECTOMY      Medical History: Past Medical History:  Diagnosis Date  . COPD (chronic obstructive pulmonary disease) ([place])   . Endometriosis   . GERD (gastroesophageal reflux disease)   . Hyperlipidemia   . Kidney stones     Family History: Family History  Problem Relation Age of Onset  . Hypertension Mother   . Hyperlipidemia Mother   . GER disease Mother   . Heart attack Father        23 yrs ago  . Hypertension Father   . Parkinson's disease Father   . GER disease Father     Social History: Social History   Socioeconomic History  . Marital status: Married    Spouse name: Not on file  . Number of children: Not on file  . Years of education: Not on file  . Highest education level: Not on file  Occupational History  . Not on file  Social Needs  . Financial resource strain: Not on file  . Food insecurity:    Worry: Not on file    Inability: Not on file  . Transportation needs:    Medical: Not on file    Non-medical: Not on file  Tobacco Use  . Smoking status: Former Smoker    Packs/day: 0.50    Types: Cigarettes    Start date: 11/09/2017  . Smokeless tobacco: Never Used  Substance and Sexual Activity  . Alcohol use: Yes  . Drug use: No  . Sexual activity: Not on file  Lifestyle  . Physical activity:    Days per week: Not on file    Minutes per session: Not on file  . Stress: Not on file  Relationships  . Social connections:    Talks on phone: Not on file    Gets together: Not on file    Attends religious service: Not on file    Active member of club or organization: Not on file    Attends meetings of clubs or organizations: Not on file    Relationship status: Not on file  . Intimate partner violence:    Fear of current or ex partner: Not on file    Emotionally abused: Not on file    Physically abused: Not on file    Forced sexual activity: Not on file   Other Topics Concern  . Not on file  Social History Narrative  . Not on file    Vital Signs: Blood pressure 132/68, pulse 82, resp. rate 16, height 5\' 4"  (1.626 m), weight 131 lb (59.4 kg), SpO2 98 %.  Examination: General Appearance: The patient is well-developed, well-nourished, and in no distress. Skin: Gross inspection of skin unremarkable. Head: normocephalic, no gross deformities. Eyes: no gross deformities noted. ENT: ears appear grossly normal no exudates. Neck: Supple. No thyromegaly. No LAD. Respiratory: rhonchi noted scattered. Cardiovascular: Normal  S1 and S2 without murmur or rub. Extremities: No cyanosis. pulses are equal. Neurologic: Alert and oriented. No involuntary movements.  LABS: No results found for this or any previous visit (from the past 2160 hour(s)).  Radiology: Dg Chest 2 View  Result Date: 10/27/2017 CLINICAL DATA:  Cough and chest congestion, 2 weeks duration. EXAM: CHEST  2 VIEW COMPARISON:  06/18/2016 FINDINGS: Heart size is normal. Aortic atherosclerosis as seen previously. Scattered areas of pulmonary scarring are present. No sign of active infiltrate, collapse or effusion. No acute bone finding. IMPRESSION: No active cardiopulmonary disease. Scattered areas of pulmonary scarring similar to the previous exams. Electronically Signed   By: Nelson Chimes M.D.   On: 10/27/2017 15:07    No results found.  No results found.    Assessment and Plan: There are no active problems to display for this patient.   1. COPD severe COPD secondary to ongoing tobacco abuse Will continue with current inhalers once again discussed smoking cessation and counseling was provided to her 2. Nicotine abuse smoker as above needs to stop smoking continue with supportive care 3. Bronchiectasis no exacerbation at this time we will continue present management 4. Chronic respiratory failure using oxygen patient is on oxygen therapy continue oxygen as she is gain benefit.   This showed represent a face-to-face encounter for oxygen evaluation  General Counseling: I have discussed the findings of the evaluation and examination with Neoma Laming.  I have also discussed any further diagnostic evaluation thatmay be needed or ordered today. Sania verbalizes understanding of the findings of todays visit. We also reviewed her medications today and discussed drug interactions and side effects including but not limited excessive drowsiness and altered mental states. We also discussed that there is always a risk not just to her but also people around her. she has been encouraged to call the office with any questions or concerns that should arise related to todays visit.    Time spent: 60min  I have personally obtained a history, examined the patient, evaluated laboratory and imaging results, formulated the assessment and plan and placed orders.    Allyne Gee, MD [facility] Pulmonary and Critical Care Sleep medicine

## 2018-03-16 NOTE — Patient Instructions (Signed)

## 2018-03-23 ENCOUNTER — Other Ambulatory Visit: Payer: Self-pay | Admitting: Internal Medicine

## 2018-04-20 ENCOUNTER — Other Ambulatory Visit: Payer: Self-pay | Admitting: Internal Medicine

## 2018-04-24 ENCOUNTER — Ambulatory Visit (INDEPENDENT_AMBULATORY_CARE_PROVIDER_SITE_OTHER): Payer: BLUE CROSS/BLUE SHIELD | Admitting: Nurse Practitioner

## 2018-04-24 ENCOUNTER — Encounter: Payer: Self-pay | Admitting: Nurse Practitioner

## 2018-04-24 VITALS — BP 91/61 | HR 82 | Resp 16 | Ht 64.0 in | Wt 128.6 lb

## 2018-04-24 DIAGNOSIS — R300 Dysuria: Secondary | ICD-10-CM

## 2018-04-24 DIAGNOSIS — R5383 Other fatigue: Secondary | ICD-10-CM

## 2018-04-24 DIAGNOSIS — Z0001 Encounter for general adult medical examination with abnormal findings: Secondary | ICD-10-CM

## 2018-04-24 DIAGNOSIS — Z124 Encounter for screening for malignant neoplasm of cervix: Secondary | ICD-10-CM | POA: Diagnosis not present

## 2018-04-24 DIAGNOSIS — J069 Acute upper respiratory infection, unspecified: Secondary | ICD-10-CM | POA: Diagnosis not present

## 2018-04-24 DIAGNOSIS — Z1231 Encounter for screening mammogram for malignant neoplasm of breast: Secondary | ICD-10-CM

## 2018-04-24 DIAGNOSIS — F411 Generalized anxiety disorder: Secondary | ICD-10-CM

## 2018-04-24 DIAGNOSIS — E559 Vitamin D deficiency, unspecified: Secondary | ICD-10-CM

## 2018-04-24 DIAGNOSIS — Z1239 Encounter for other screening for malignant neoplasm of breast: Secondary | ICD-10-CM

## 2018-04-24 MED ORDER — ALPRAZOLAM 0.25 MG PO TABS: 0.2500 mg | ORAL_TABLET | Freq: Three times a day (TID) | ORAL | 1 refills | Status: DC | PRN

## 2018-04-24 NOTE — Progress Notes (Signed)
[facility] [place], [place] [address]  Internal MEDICINE  Office Visit Note  Patient Name: Rachael Blankenship  [id number]  [id number]  Date of Service: 05/13/2018   Pt is here for routine health maintenance examination  Chief Complaint  Patient presents with  . Annual Exam  . Gynecologic Exam  . Fatigue     The patient states that she has been feeling unusually fatigued recently. She also states that she wakes up with a headache most days. Starts when her mother wakes up and gradually gets better as the day goes on.  She also reports an intermittent abdominal discomfort on the upper left part of her belly. Gets worse when she bends down and improves when she stands back up and stretches. Only lasts for a few minutes at a time.     Current Medication: Outpatient Encounter Medications as of 04/24/2018  Medication Sig  . alendronate (FOSAMAX) 70 MG tablet TAKE 1 TABLET BY MOUTH EVERY WEEK  . ALPRAZolam (XANAX) 0.25 MG tablet Take 1 tablet (0.25 mg total) by mouth 3 (three) times daily as needed for anxiety.  Marland Kitchen ascorbic acid (VITAMIN C) 500 MG tablet Take 500 mg by mouth daily.  Marland Kitchen aspirin 81 MG chewable tablet Chew by mouth.  Marland Kitchen aspirin 81 MG tablet Take 81 mg by mouth daily.  . cholecalciferol (VITAMIN D) 400 units TABS tablet Take 1,000 Units by mouth.  . Cholecalciferol (VITAMIN D-1000 MAX ST) 1000 units tablet Take by mouth.  . citalopram (CELEXA) 20 MG tablet Take 20 mg by mouth daily.  . citalopram (CELEXA) 20 MG tablet Take by mouth.  . co-enzyme Q-10 50 MG capsule Take 100 mg by mouth daily.  . Coenzyme Q10 (CO Q-10) 100 MG CAPS Take by mouth.  . furosemide (LASIX) 40 MG tablet Take 40 mg by mouth.  . furosemide (LASIX) 40 MG tablet take 1 tablet by mouth once daily  . ibuprofen (ADVIL,MOTRIN) 800 MG tablet Take 800 mg by mouth every 8 (eight) hours as needed.  . Ipratropium-Albuterol (COMBIVENT RESPIMAT) 20-100 MCG/ACT AERS respimat Inhale 1 puff  into the lungs every 6 (six) hours.  Marland Kitchen ipratropium-albuterol (DUONEB) 0.5-2.5 (3) MG/3ML SOLN Use 1 vial via nebulizer every 4 t0 6 hrs  . Iron-Vitamin C (IRON 100/C PO) Take by mouth.  . levofloxacin (LEVAQUIN) 500 MG tablet Take 1 tablet (500 mg total) by mouth daily. For 10 days  . Magnesium 250 MG TABS Take by mouth.  Marland Kitchen MAGNESIUM PO Take by mouth.  . mometasone-formoterol (DULERA) 100-5 MCG/ACT AERO Inhale 2 puffs into the lungs 2 (two) times daily.  . montelukast (SINGULAIR) 10 MG tablet TAKE 1 TABLET BY MOUTH EVERY DAY FOR RHINITIS OR ALLERGIES  . Multiple Vitamin (MULTIVITAMIN) capsule Take 1 capsule by mouth daily.  Marland Kitchen omeprazole (PRILOSEC) 40 MG capsule Take 40 mg by mouth daily.  . SOYBEAN PO Take by mouth.  . tiotropium (SPIRIVA) 18 MCG inhalation capsule Place 18 mcg into inhaler and inhale daily.  . vitamin B-12 (CYANOCOBALAMIN) 1000 MCG tablet Take 1,000 mcg by mouth daily.  . vitamin B-12 (CYANOCOBALAMIN) 1000 MCG tablet Take by mouth.  . vitamin C (ASCORBIC ACID) 500 MG tablet Take by mouth.  . vitamin E 400 UNIT capsule Take 400 Units by mouth daily.  . vitamin k 100 MCG tablet Take 100 mcg by mouth daily.  . [DISCONTINUED] ALPRAZolam (XANAX) 0.25 MG tablet Take 0.25 mg by mouth at bedtime as needed for anxiety.  . [  DISCONTINUED] ALPRAZolam (XANAX) 0.25 MG tablet Take by mouth.  . [DISCONTINUED] potassium chloride (KLOR-CON) 8 MEQ tablet Take 8 mEq by mouth daily.  . [DISCONTINUED] Potassium Chloride CR (MICRO-K) 8 MEQ CPCR capsule CR Take 1 cap po daily may take extra 2-3 times a week  . [DISCONTINUED] simvastatin (ZOCOR) 20 MG tablet Take 20 mg by mouth daily.  Marland Kitchen amoxicillin-clavulanate (AUGMENTIN) 875-125 MG tablet Take 1 tablet by mouth 2 (two) times daily.   No facility-administered encounter medications on file as of 04/24/2018.     Surgical History: Past Surgical History:  Procedure Laterality Date  . KIDNEY STONE SURGERY    . TONSILLECTOMY      Medical  History: Past Medical History:  Diagnosis Date  . COPD (chronic obstructive pulmonary disease) ([place])   . Endometriosis   . GERD (gastroesophageal reflux disease)   . Hyperlipidemia   . Kidney stones     Family History: Family History  Problem Relation Age of Onset  . Hypertension Mother   . Hyperlipidemia Mother   . GER disease Mother   . Heart attack Father        23 yrs ago  . Hypertension Father   . Parkinson's disease Father   . GER disease Father       Review of Systems  Constitutional: Positive for fatigue. Negative for chills and unexpected weight change.  HENT: Negative for congestion, postnasal drip, rhinorrhea, sneezing and sore throat.   Eyes: Negative.  Negative for redness.  Respiratory: Negative for cough, chest tightness, shortness of breath and wheezing.   Cardiovascular: Negative for chest pain and palpitations.  Gastrointestinal: Positive for abdominal pain. Negative for constipation, diarrhea, nausea and vomiting.  Endocrine: Negative for cold intolerance, heat intolerance, polydipsia, polyphagia and polyuria.  Genitourinary: Negative for flank pain, frequency, vaginal bleeding and vaginal discharge.  Musculoskeletal: Negative for arthralgias, back pain, joint swelling and neck pain.  Skin: Negative for rash.  Allergic/Immunologic: Positive for environmental allergies.  Neurological: Positive for headaches. Negative for dizziness, tremors and numbness.  Hematological: Negative for adenopathy. Does not bruise/bleed easily.  Psychiatric/Behavioral: Negative for behavioral problems (Depression), sleep disturbance and suicidal ideas. The patient is not nervous/anxious.      Today's Vitals   04/24/18 1000  BP: 91/61  Pulse: 82  Resp: 16  SpO2: 90%  Weight: 128 lb 9.6 oz (58.3 kg)  Height: 5\' 4"  (1.626 m)    Physical Exam  Constitutional: She is oriented to person, place, and time. She appears well-developed and well-nourished. No distress.  HENT:   Head: Normocephalic and atraumatic.  Nose: Nose normal.  Mouth/Throat: Oropharynx is clear and moist. No oropharyngeal exudate.  Eyes: Pupils are equal, round, and reactive to light. Conjunctivae and EOM are normal.  Neck: Normal range of motion. Neck supple. No JVD present. Carotid bruit is not present. No tracheal deviation present. No thyromegaly present.  Cardiovascular: Normal rate, regular rhythm, normal heart sounds and intact distal pulses. Exam reveals no gallop and no friction rub.  No murmur heard. Pulmonary/Chest: Effort normal. No respiratory distress. She has wheezes. She has no rales. She exhibits no tenderness. Right breast exhibits no inverted nipple, no mass, no nipple discharge, no skin change and no tenderness. Left breast exhibits no inverted nipple, no mass, no nipple discharge, no skin change and no tenderness.  Congested, non-productive cough.   Abdominal: Soft. Bowel sounds are normal.  Genitourinary: Vagina normal and uterus normal.  Genitourinary Comments: No tenderness, masses, or organomegaly present during bimanual exam.  Musculoskeletal: Normal range of motion.  Lymphadenopathy:    She has no cervical adenopathy.  Neurological: She is alert and oriented to person, place, and time. No cranial nerve deficit.  Skin: Skin is warm and dry. Capillary refill takes 2 to 3 seconds. She is not diaphoretic.  Psychiatric: She has a normal mood and affect. Her behavior is normal. Judgment and thought content normal.     LABS: Recent Results (from the past 2160 hour(s))  Pap IG and HPV (high risk) DNA detection     Status: None   Collection Time: 04/24/18  2:37 PM  Result Value Ref Range   DIAGNOSIS: Comment     Comment: NEGATIVE FOR INTRAEPITHELIAL LESION OR MALIGNANCY.   Specimen adequacy: Comment     Comment: Satisfactory for evaluation. Endocervical and/or squamous metaplastic cells (endocervical component) are present.    Clinician Provided ICD10 Comment      Comment: Z12.4   Performed by: Comment     Comment: Raiford Noble, Cytotechnologist (ASCP)   PAP Smear Comment .    Note: Comment     Comment: The Pap smear is a screening test designed to aid in the detection of premalignant and malignant conditions of the uterine cervix.  It is not a diagnostic procedure and should not be used as the sole means of detecting cervical cancer.  Both false-positive and false-negative reports do occur.    Test Methodology Comment     Comment: This liquid based ThinPrep(R) pap test was screened with the use of an image guided system.    HPV, high-risk Negative Negative    Comment: This high-risk HPV test detects thirteen high-risk types (16/18/31/33/35/39/45/51/52/56/58/59/68) without differentiation.   Urinalysis, Routine w reflex microscopic     Status: Abnormal   Collection Time: 04/24/18  2:37 PM  Result Value Ref Range   Specific Gravity, UA 1.007 1.005 - 1.030   pH, UA 8.0 (H) 5.0 - 7.5   Color, UA Yellow Yellow   Appearance Ur Clear Clear   Leukocytes, UA Trace (A) Negative   Protein, UA Negative Negative/Trace   Glucose, UA Negative Negative   Ketones, UA Negative Negative   RBC, UA Negative Negative   Bilirubin, UA Negative Negative   Urobilinogen, Ur 0.2 0.2 - 1.0 mg/dL   Nitrite, UA Negative Negative   Microscopic Examination See below:     Comment: Microscopic was indicated and was performed.  Microscopic Examination     Status: None   Collection Time: 04/24/18  2:37 PM  Result Value Ref Range   WBC, UA 0-5 0 - 5 /hpf   RBC, UA None seen 0 - 2 /hpf   Epithelial Cells (non renal) 0-10 0 - 10 /hpf   Casts None seen None seen /lpf   Bacteria, UA None seen None seen/Few  Comprehensive metabolic panel     Status: Abnormal   Collection Time: 04/29/18 10:40 AM  Result Value Ref Range   Glucose 134 (H) 65 - 99 mg/dL   BUN 8 8 - 27 mg/dL   Creatinine, Ser 0.64 0.57 - 1.00 mg/dL   GFR calc non Af Amer 95 >59 mL/min/1.73   GFR calc Af  Amer 109 >59 mL/min/1.73   BUN/Creatinine Ratio 13 12 - 28   Sodium 142 134 - 144 mmol/L   Potassium 4.1 3.5 - 5.2 mmol/L   Chloride 98 96 - 106 mmol/L   CO2 27 20 - 29 mmol/L   Calcium 9.6 8.7 - 10.3 mg/dL   Total Protein  6.7 6.0 - 8.5 g/dL   Albumin 4.3 3.6 - 4.8 g/dL   Globulin, Total 2.4 1.5 - 4.5 g/dL   Albumin/Globulin Ratio 1.8 1.2 - 2.2   Bilirubin Total 0.6 0.0 - 1.2 mg/dL   Alkaline Phosphatase 110 39 - 117 IU/L   AST 19 0 - 40 IU/L   ALT 18 0 - 32 IU/L  CBC     Status: Abnormal   Collection Time: 04/29/18 10:40 AM  Result Value Ref Range   WBC 9.5 3.4 - 10.8 x10E3/uL   RBC 5.31 (H) 3.77 - 5.28 x10E6/uL   Hemoglobin 15.4 11.1 - 15.9 g/dL   Hematocrit 44.1 34.0 - 46.6 %   MCV 83 79 - 97 fL   MCH 29.0 26.6 - 33.0 pg   MCHC 34.9 31.5 - 35.7 g/dL   RDW 14.1 12.3 - 15.4 %   Platelets 292 150 - 450 x10E3/uL  Lipid Panel w/o Chol/HDL Ratio     Status: None   Collection Time: 04/29/18 10:40 AM  Result Value Ref Range   Cholesterol, Total 170 100 - 199 mg/dL   Triglycerides 100 0 - 149 mg/dL   HDL 55 >39 mg/dL   VLDL Cholesterol Cal 20 5 - 40 mg/dL   LDL Calculated 95 0 - 99 mg/dL  Iron and TIBC     Status: Abnormal   Collection Time: 04/29/18 10:40 AM  Result Value Ref Range   Total Iron Binding Capacity 318 250 - 450 ug/dL   UIBC 279 118 - 369 ug/dL   Iron 39 27 - 139 ug/dL   Iron Saturation 12 (L) 15 - 55 %  B12 and Folate Panel     Status: Abnormal   Collection Time: 04/29/18 10:40 AM  Result Value Ref Range   Vitamin B-12 1,669 (H) 232 - 1,245 pg/mL   Folate >20.0 >3.0 ng/mL    Comment: A serum folate concentration of less than 3.1 ng/mL is considered to represent clinical deficiency.   T4, free     Status: None   Collection Time: 04/29/18 10:40 AM  Result Value Ref Range   Free T4 1.30 0.82 - 1.77 ng/dL  TSH     Status: None   Collection Time: 04/29/18 10:40 AM  Result Value Ref Range   TSH 1.110 0.450 - 4.500 uIU/mL  VITAMIN D 25 Hydroxy (Vit-D  Deficiency, Fractures)     Status: None   Collection Time: 04/29/18 10:40 AM  Result Value Ref Range   Vit D, 25-Hydroxy 56.3 30.0 - 100.0 ng/mL    Comment: Vitamin D deficiency has been defined by the [place] and an Endocrine Society practice guideline as a level of serum 25-OH vitamin D less than 20 ng/mL (1,2). The Endocrine Society went on to further define vitamin D insufficiency as a level between 21 and 29 ng/mL (2). 1. IOM (Institute of Medicine). 2010. Dietary reference    intakes for calcium and D. [place]: The    [employer]. 2. Holick MF, Binkley Vashon, Bischoff-Ferrari HA, et al.    Evaluation, treatment, and prevention of vitamin D    deficiency: an Endocrine Society clinical practice    guideline. JCEM. 2011 Jul; 96(7):1911-30.   Ferritin     Status: None   Collection Time: 04/29/18 10:40 AM  Result Value Ref Range   Ferritin 87 15 - 150 ng/mL    Assessment/Plan: 1. Encounter for general adult medical examination with abnormal findings Annual wellness visit today.  -  CBC with Differential/Platelet - Comprehensive metabolic panel - T4, free - TSH - Lipid panel  2. Acute upper respiratory infection augmentin 875mg  twice daily for 10 days. Rest and increase fluids. Use OTC medication to alleviate symptoms.  - amoxicillin-clavulanate (AUGMENTIN) 875-125 MG tablet; Take 1 tablet by mouth 2 (two) times daily.  Dispense: 20 tablet; Refill: 0  3. Other fatigue - CBC with Differential/Platelet - Comprehensive metabolic panel - T4, free - TSH - Fe+TIBC+Fer  4. Generalized anxiety disorder - ALPRAZolam (XANAX) 0.25 MG tablet; Take 1 tablet (0.25 mg total) by mouth 3 (three) times daily as needed for anxiety.  Dispense: 90 tablet; Refill: 1  5. Vitamin D deficiency - Vitamin D 1,25 dihydroxy  6. Routine cervical smear Pap smear today  7. Screening for malignant neoplasm of cervix - Pap IG and HPV (high risk) DNA detection  8.  Dysuria - Urinalysis, Routine w reflex microscopic  9. Screening for breast cancer - MM DIGITAL SCREENING BILATERAL; Future  General Counseling: sallye lunz understanding of the findings of todays visit and agrees with plan of treatment. I have discussed any further diagnostic evaluation that may be needed or ordered today. We also reviewed her medications today. she has been encouraged to call the office with any questions or concerns that should arise related to todays visit.    Counseling:  This patient was seen by Leretha Pol, FNP- C in Collaboration with Dr Lavera Guise as a part of collaborative care agreement  Orders Placed This Encounter  Procedures  . Microscopic Examination  . MM DIGITAL SCREENING BILATERAL  . Urinalysis, Routine w reflex microscopic  . CBC with Differential/Platelet  . Comprehensive metabolic panel  . T4, free  . TSH  . Lipid panel  . Vitamin D 1,25 dihydroxy  . Fe+TIBC+Fer    Meds ordered this encounter  Medications  . ALPRAZolam (XANAX) 0.25 MG tablet    Sig: Take 1 tablet (0.25 mg total) by mouth 3 (three) times daily as needed for anxiety.    Dispense:  90 tablet    Refill:  1    Order Specific Question:   Supervising Provider    Answer:   Lavera Guise [[id number]]  . amoxicillin-clavulanate (AUGMENTIN) 875-125 MG tablet    Sig: Take 1 tablet by mouth 2 (two) times daily.    Dispense:  20 tablet    Refill:  0    Order Specific Question:   Supervising Provider    Answer:   Lavera Guise [[id number]]    Time spent: [place], MD  Internal Medicine

## 2018-04-25 LAB — MICROSCOPIC EXAMINATION
Bacteria, UA: NONE SEEN
Casts: NONE SEEN /lpf
RBC, UA: NONE SEEN /hpf (ref 0–2)

## 2018-04-25 LAB — URINALYSIS, ROUTINE W REFLEX MICROSCOPIC
Bilirubin, UA: NEGATIVE
Glucose, UA: NEGATIVE
Ketones, UA: NEGATIVE
Nitrite, UA: NEGATIVE
PH UA: 8 — AB (ref 5.0–7.5)
PROTEIN UA: NEGATIVE
RBC UA: NEGATIVE
Specific Gravity, UA: 1.007 (ref 1.005–1.030)
UUROB: 0.2 mg/dL (ref 0.2–1.0)

## 2018-04-28 LAB — PAP IG AND HPV HIGH-RISK
HPV, high-risk: NEGATIVE
PAP SMEAR COMMENT: 0

## 2018-04-29 ENCOUNTER — Other Ambulatory Visit: Payer: Self-pay | Admitting: Nurse Practitioner

## 2018-04-30 ENCOUNTER — Other Ambulatory Visit: Payer: Self-pay | Admitting: Internal Medicine

## 2018-04-30 ENCOUNTER — Telehealth: Payer: Self-pay | Admitting: Nurse Practitioner

## 2018-04-30 LAB — LIPID PANEL W/O CHOL/HDL RATIO
Cholesterol, Total: 170 mg/dL (ref 100–199)
HDL: 55 mg/dL (ref >39)
LDL CALC: 95 mg/dL (ref 0–99)
Triglycerides: 100 mg/dL (ref 0–149)
VLDL CHOLESTEROL CAL: 20 mg/dL (ref 5–40)

## 2018-04-30 LAB — IRON AND TIBC
IRON: 39 ug/dL (ref 27–139)
Iron Saturation: 12 % — ABNORMAL LOW (ref 15–55)
TIBC: 318 ug/dL (ref 250–450)
UIBC: 279 ug/dL (ref 118–369)

## 2018-04-30 LAB — COMPREHENSIVE METABOLIC PANEL
A/G RATIO: 1.8 (ref 1.2–2.2)
ALT: 18 IU/L (ref 0–32)
AST: 19 IU/L (ref 0–40)
Albumin: 4.3 g/dL (ref 3.6–4.8)
Alkaline Phosphatase: 110 IU/L (ref 39–117)
BUN/Creatinine Ratio: 13 (ref 12–28)
BUN: 8 mg/dL (ref 8–27)
Bilirubin Total: 0.6 mg/dL (ref 0.0–1.2)
CALCIUM: 9.6 mg/dL (ref 8.7–10.3)
CO2: 27 mmol/L (ref 20–29)
CREATININE: 0.64 mg/dL (ref 0.57–1.00)
Chloride: 98 mmol/L (ref 96–106)
GFR, EST AFRICAN AMERICAN: 109 mL/min/{1.73_m2} (ref >59)
GFR, EST NON AFRICAN AMERICAN: 95 mL/min/{1.73_m2} (ref >59)
GLUCOSE: 134 mg/dL — AB (ref 65–99)
Globulin, Total: 2.4 g/dL (ref 1.5–4.5)
Potassium: 4.1 mmol/L (ref 3.5–5.2)
Sodium: 142 mmol/L (ref 134–144)
TOTAL PROTEIN: 6.7 g/dL (ref 6.0–8.5)

## 2018-04-30 LAB — T4, FREE: Free T4: 1.3 ng/dL (ref 0.82–1.77)

## 2018-04-30 LAB — CBC
HEMATOCRIT: 44.1 % (ref 34.0–46.6)
HEMOGLOBIN: 15.4 g/dL (ref 11.1–15.9)
MCH: 29 pg (ref 26.6–33.0)
MCHC: 34.9 g/dL (ref 31.5–35.7)
MCV: 83 fL (ref 79–97)
Platelets: 292 10*3/uL (ref 150–450)
RBC: 5.31 x10E6/uL — ABNORMAL HIGH (ref 3.77–5.28)
RDW: 14.1 % (ref 12.3–15.4)
WBC: 9.5 10*3/uL (ref 3.4–10.8)

## 2018-04-30 LAB — TSH: TSH: 1.11 u[IU]/mL (ref 0.450–4.500)

## 2018-04-30 LAB — B12 AND FOLATE PANEL
Folate: >20 ng/mL (ref >3.0)
Vitamin B-12: 1669 pg/mL — ABNORMAL HIGH (ref 232–1245)

## 2018-04-30 LAB — FERRITIN: Ferritin: 87 ng/mL (ref 15–150)

## 2018-04-30 LAB — VITAMIN D 25 HYDROXY (VIT D DEFICIENCY, FRACTURES): VIT D 25 HYDROXY: 56.3 ng/mL (ref 30.0–100.0)

## 2018-04-30 MED ORDER — AMOXICILLIN-POT CLAVULANATE 875-125 MG PO TABS: 1.0000 | ORAL_TABLET | Freq: Two times a day (BID) | ORAL | 0 refills | Status: DC

## 2018-04-30 NOTE — Telephone Encounter (Signed)
Please let her know I sent in augmentin 875mg  bid for 10 days. Rest, increase fluids. OTC medications to help symptoms. Please also let her know her pap was normal. Thanks.

## 2018-05-01 ENCOUNTER — Other Ambulatory Visit: Payer: Self-pay

## 2018-05-01 MED ORDER — POTASSIUM CHLORIDE ER 8 MEQ PO CPCR: ORAL_CAPSULE | ORAL | 3 refills | Status: DC

## 2018-05-13 ENCOUNTER — Other Ambulatory Visit: Payer: Self-pay | Admitting: Internal Medicine

## 2018-05-13 DIAGNOSIS — R5383 Other fatigue: Secondary | ICD-10-CM | POA: Insufficient documentation

## 2018-05-13 DIAGNOSIS — R300 Dysuria: Secondary | ICD-10-CM | POA: Insufficient documentation

## 2018-05-13 DIAGNOSIS — Z13820 Encounter for screening for osteoporosis: Secondary | ICD-10-CM | POA: Insufficient documentation

## 2018-05-13 DIAGNOSIS — F411 Generalized anxiety disorder: Secondary | ICD-10-CM | POA: Insufficient documentation

## 2018-05-13 DIAGNOSIS — J069 Acute upper respiratory infection, unspecified: Secondary | ICD-10-CM | POA: Insufficient documentation

## 2018-05-13 DIAGNOSIS — E559 Vitamin D deficiency, unspecified: Secondary | ICD-10-CM | POA: Insufficient documentation

## 2018-05-13 DIAGNOSIS — Z124 Encounter for screening for malignant neoplasm of cervix: Secondary | ICD-10-CM | POA: Insufficient documentation

## 2018-05-21 ENCOUNTER — Emergency Department: Payer: BLUE CROSS/BLUE SHIELD

## 2018-05-21 ENCOUNTER — Encounter: Payer: Self-pay | Admitting: Internal Medicine

## 2018-05-21 ENCOUNTER — Encounter: Payer: Self-pay | Admitting: Emergency Medicine

## 2018-05-21 ENCOUNTER — Other Ambulatory Visit: Payer: Self-pay

## 2018-05-21 ENCOUNTER — Ambulatory Visit: Payer: BLUE CROSS/BLUE SHIELD | Admitting: Internal Medicine

## 2018-05-21 ENCOUNTER — Inpatient Hospital Stay
Admission: EM | Admit: 2018-05-21 | Discharge: 2018-05-23 | DRG: 189 | Disposition: A | Discharge status: Home / Self Care | Payer: BLUE CROSS/BLUE SHIELD | Attending: Internal Medicine | Admitting: Internal Medicine

## 2018-05-21 VITALS — BP 106/87 | HR 99 | Resp 16 | Ht 64.0 in | Wt 125.0 lb

## 2018-05-21 DIAGNOSIS — J440 Chronic obstructive pulmonary disease with acute lower respiratory infection: Secondary | ICD-10-CM | POA: Diagnosis present

## 2018-05-21 DIAGNOSIS — E785 Hyperlipidemia, unspecified: Secondary | ICD-10-CM | POA: Diagnosis present

## 2018-05-21 DIAGNOSIS — J9621 Acute and chronic respiratory failure with hypoxia: Secondary | ICD-10-CM | POA: Diagnosis present

## 2018-05-21 DIAGNOSIS — F17210 Nicotine dependence, cigarettes, uncomplicated: Secondary | ICD-10-CM | POA: Diagnosis present

## 2018-05-21 DIAGNOSIS — Z9981 Dependence on supplemental oxygen: Secondary | ICD-10-CM | POA: Diagnosis not present

## 2018-05-21 DIAGNOSIS — E871 Hypo-osmolality and hyponatremia: Secondary | ICD-10-CM | POA: Diagnosis present

## 2018-05-21 DIAGNOSIS — J441 Chronic obstructive pulmonary disease with (acute) exacerbation: Secondary | ICD-10-CM | POA: Diagnosis present

## 2018-05-21 DIAGNOSIS — Z7982 Long term (current) use of aspirin: Secondary | ICD-10-CM

## 2018-05-21 DIAGNOSIS — E876 Hypokalemia: Secondary | ICD-10-CM | POA: Diagnosis present

## 2018-05-21 DIAGNOSIS — R0902 Hypoxemia: Secondary | ICD-10-CM

## 2018-05-21 DIAGNOSIS — Z79899 Other long term (current) drug therapy: Secondary | ICD-10-CM | POA: Diagnosis not present

## 2018-05-21 DIAGNOSIS — R509 Fever, unspecified: Secondary | ICD-10-CM | POA: Diagnosis not present

## 2018-05-21 DIAGNOSIS — R0602 Shortness of breath: Secondary | ICD-10-CM | POA: Diagnosis not present

## 2018-05-21 DIAGNOSIS — J209 Acute bronchitis, unspecified: Secondary | ICD-10-CM | POA: Diagnosis present

## 2018-05-21 DIAGNOSIS — W57XXXS Bitten or stung by nonvenomous insect and other nonvenomous arthropods, sequela: Secondary | ICD-10-CM

## 2018-05-21 DIAGNOSIS — Z7951 Long term (current) use of inhaled steroids: Secondary | ICD-10-CM

## 2018-05-21 DIAGNOSIS — K219 Gastro-esophageal reflux disease without esophagitis: Secondary | ICD-10-CM | POA: Diagnosis present

## 2018-05-21 DIAGNOSIS — J9601 Acute respiratory failure with hypoxia: Secondary | ICD-10-CM | POA: Diagnosis present

## 2018-05-21 LAB — URINALYSIS, COMPLETE (UACMP) WITH MICROSCOPIC
Bacteria, UA: NONE SEEN
Bilirubin Urine: NEGATIVE
Glucose, UA: 50 mg/dL — AB
Hgb urine dipstick: NEGATIVE
Ketones, ur: NEGATIVE mg/dL
Leukocytes, UA: NEGATIVE
Nitrite: NEGATIVE
Protein, ur: NEGATIVE mg/dL
Specific Gravity, Urine: 1.006 (ref 1.005–1.030)
Squamous Epithelial / HPF: NONE SEEN (ref 0–5)
pH: 6 (ref 5.0–8.0)

## 2018-05-21 LAB — COMPREHENSIVE METABOLIC PANEL
ALBUMIN: 3.6 g/dL (ref 3.5–5.0)
ALK PHOS: 86 U/L (ref 38–126)
ALT: 14 U/L (ref 0–44)
AST: 21 U/L (ref 15–41)
Anion gap: 10 (ref 5–15)
BILIRUBIN TOTAL: 1.2 mg/dL (ref 0.3–1.2)
BUN: 12 mg/dL (ref 8–23)
CALCIUM: 8.2 mg/dL — AB (ref 8.9–10.3)
CO2: 20 mmol/L — ABNORMAL LOW (ref 22–32)
Chloride: 103 mmol/L (ref 98–111)
Creatinine, Ser: 0.48 mg/dL (ref 0.44–1.00)
GFR calc Af Amer: >60 mL/min (ref >60)
GFR calc non Af Amer: >60 mL/min (ref >60)
GLUCOSE: 150 mg/dL — AB (ref 70–99)
Potassium: 3.3 mmol/L — ABNORMAL LOW (ref 3.5–5.1)
Sodium: 133 mmol/L — ABNORMAL LOW (ref 135–145)
TOTAL PROTEIN: 7.1 g/dL (ref 6.5–8.1)

## 2018-05-21 LAB — CBC WITH DIFFERENTIAL/PLATELET
Basophils Absolute: 0.1 10*3/uL (ref 0–0.1)
Basophils Relative: 0 %
Eosinophils Absolute: 0 10*3/uL (ref 0–0.7)
Eosinophils Relative: 0 %
HCT: 45.2 % (ref 35.0–47.0)
Hemoglobin: 15.3 g/dL (ref 12.0–16.0)
Lymphocytes Relative: 4 %
Lymphs Abs: 0.8 10*3/uL — ABNORMAL LOW (ref 1.0–3.6)
MCH: 28.8 pg (ref 26.0–34.0)
MCHC: 33.8 g/dL (ref 32.0–36.0)
MCV: 85.2 fL (ref 80.0–100.0)
Monocytes Absolute: 1.2 10*3/uL — ABNORMAL HIGH (ref 0.2–0.9)
Monocytes Relative: 6 %
Neutro Abs: 17 10*3/uL — ABNORMAL HIGH (ref 1.4–6.5)
Neutrophils Relative %: 90 %
Platelets: 232 10*3/uL (ref 150–440)
RBC: 5.31 MIL/uL — ABNORMAL HIGH (ref 3.80–5.20)
RDW: 14.6 % — ABNORMAL HIGH (ref 11.5–14.5)
WBC: 19.1 10*3/uL — ABNORMAL HIGH (ref 3.6–11.0)

## 2018-05-21 LAB — TROPONIN I: Troponin I: <0.03 ng/mL

## 2018-05-21 LAB — LACTIC ACID, PLASMA: Lactic Acid, Venous: 1.5 mmol/L (ref 0.5–1.9)

## 2018-05-21 MED ORDER — IPRATROPIUM-ALBUTEROL 0.5-2.5 (3) MG/3ML IN SOLN: 3.0000 mL | Freq: Once | RESPIRATORY_TRACT | Status: DC

## 2018-05-21 MED ORDER — ASPIRIN EC 81 MG PO TBEC
81.0000 mg | DELAYED_RELEASE_TABLET | Freq: Every day | ORAL | Status: DC
  Administered 2018-05-22 (×2): 81 mg via ORAL
  Filled 2018-05-21 (×2): qty 1

## 2018-05-21 MED ORDER — ONDANSETRON HCL 4 MG/2ML IJ SOLN: 4.0000 mg | Freq: Four times a day (QID) | INTRAMUSCULAR | Status: DC | PRN

## 2018-05-21 MED ORDER — ALENDRONATE SODIUM 70 MG PO TABS: 70.0000 mg | ORAL_TABLET | ORAL | Status: DC

## 2018-05-21 MED ORDER — TIOTROPIUM BROMIDE MONOHYDRATE 18 MCG IN CAPS
18.0000 ug | ORAL_CAPSULE | Freq: Every day | RESPIRATORY_TRACT | Status: DC
  Administered 2018-05-22 – 2018-05-23 (×2): 18 ug via RESPIRATORY_TRACT
  Filled 2018-05-21: qty 5

## 2018-05-21 MED ORDER — SODIUM CHLORIDE 0.9 % IV SOLN
INTRAVENOUS | Status: DC
  Administered 2018-05-22: 01:00:00 via INTRAVENOUS

## 2018-05-21 MED ORDER — METHYLPREDNISOLONE SODIUM SUCC 125 MG IJ SOLR
125.0000 mg | Freq: Once | INTRAMUSCULAR | Status: AC
  Administered 2018-05-21: 125 mg via INTRAVENOUS
  Filled 2018-05-21: qty 2

## 2018-05-21 MED ORDER — IPRATROPIUM-ALBUTEROL 0.5-2.5 (3) MG/3ML IN SOLN
5.0000 mL | Freq: Once | RESPIRATORY_TRACT | Status: AC
  Administered 2018-05-21: 5 mL via RESPIRATORY_TRACT
  Filled 2018-05-21: qty 6

## 2018-05-21 MED ORDER — VITAMIN E 180 MG (400 UNIT) PO CAPS
400.0000 [IU] | ORAL_CAPSULE | Freq: Every day | ORAL | Status: DC
  Administered 2018-05-22 – 2018-05-23 (×2): 400 [IU] via ORAL
  Filled 2018-05-21 (×2): qty 1

## 2018-05-21 MED ORDER — MOMETASONE FURO-FORMOTEROL FUM 200-5 MCG/ACT IN AERO
2.0000 | INHALATION_SPRAY | Freq: Two times a day (BID) | RESPIRATORY_TRACT | Status: DC
  Administered 2018-05-22 (×2): 2 via RESPIRATORY_TRACT
  Filled 2018-05-21: qty 8.8

## 2018-05-21 MED ORDER — ACETAMINOPHEN 650 MG RE SUPP: 650.0000 mg | Freq: Four times a day (QID) | RECTAL | Status: DC | PRN

## 2018-05-21 MED ORDER — ACETAMINOPHEN 325 MG PO TABS
650.0000 mg | ORAL_TABLET | Freq: Four times a day (QID) | ORAL | Status: DC | PRN
  Administered 2018-05-22: 650 mg via ORAL
  Filled 2018-05-21: qty 2

## 2018-05-21 MED ORDER — FUROSEMIDE 40 MG PO TABS
40.0000 mg | ORAL_TABLET | Freq: Every day | ORAL | Status: DC
  Administered 2018-05-22 – 2018-05-23 (×2): 40 mg via ORAL
  Filled 2018-05-21 (×2): qty 1

## 2018-05-21 MED ORDER — CO Q-10 100 MG PO CAPS: 100.0000 mg | ORAL_CAPSULE | Freq: Every day | ORAL | Status: DC

## 2018-05-21 MED ORDER — VITAMIN D 1000 UNITS PO TABS
1000.0000 [IU] | ORAL_TABLET | Freq: Every day | ORAL | Status: DC
  Administered 2018-05-22 – 2018-05-23 (×2): 1000 [IU] via ORAL
  Filled 2018-05-21 (×2): qty 1

## 2018-05-21 MED ORDER — IPRATROPIUM-ALBUTEROL 0.5-2.5 (3) MG/3ML IN SOLN
3.0000 mL | Freq: Once | RESPIRATORY_TRACT | Status: AC
  Administered 2018-05-21: 3 mL via RESPIRATORY_TRACT
  Filled 2018-05-21: qty 3

## 2018-05-21 MED ORDER — SODIUM CHLORIDE 0.9 % IV SOLN
Freq: Once | INTRAVENOUS | Status: AC
  Administered 2018-05-21: 17:00:00 via INTRAVENOUS

## 2018-05-21 MED ORDER — ALPRAZOLAM 0.25 MG PO TABS: 0.2500 mg | ORAL_TABLET | Freq: Three times a day (TID) | ORAL | Status: DC | PRN

## 2018-05-21 MED ORDER — VITAMIN C 500 MG PO TABS
500.0000 mg | ORAL_TABLET | Freq: Every day | ORAL | Status: DC
  Administered 2018-05-22 – 2018-05-23 (×2): 500 mg via ORAL
  Filled 2018-05-21 (×3): qty 1

## 2018-05-21 MED ORDER — DOXYCYCLINE HYCLATE 100 MG PO TABS
100.0000 mg | ORAL_TABLET | Freq: Once | ORAL | Status: AC
  Administered 2018-05-21: 100 mg via ORAL
  Filled 2018-05-21: qty 1

## 2018-05-21 MED ORDER — PANTOPRAZOLE SODIUM 40 MG PO TBEC
80.0000 mg | DELAYED_RELEASE_TABLET | Freq: Every day | ORAL | Status: DC
  Administered 2018-05-22 – 2018-05-23 (×2): 80 mg via ORAL
  Filled 2018-05-21 (×2): qty 2

## 2018-05-21 MED ORDER — ENOXAPARIN SODIUM 40 MG/0.4ML ~~LOC~~ SOLN
40.0000 mg | SUBCUTANEOUS | Status: DC
  Administered 2018-05-22: 40 mg via SUBCUTANEOUS
  Filled 2018-05-21: qty 0.4

## 2018-05-21 MED ORDER — DOCUSATE SODIUM 100 MG PO CAPS
100.0000 mg | ORAL_CAPSULE | Freq: Two times a day (BID) | ORAL | Status: DC
  Administered 2018-05-22: 09:00:00 100 mg via ORAL
  Filled 2018-05-21 (×3): qty 1

## 2018-05-21 MED ORDER — SIMVASTATIN 20 MG PO TABS
20.0000 mg | ORAL_TABLET | Freq: Every evening | ORAL | Status: DC
  Administered 2018-05-22 (×2): 20 mg via ORAL
  Filled 2018-05-21 (×2): qty 1

## 2018-05-21 MED ORDER — CITALOPRAM HYDROBROMIDE 20 MG PO TABS
20.0000 mg | ORAL_TABLET | Freq: Every day | ORAL | Status: DC
  Administered 2018-05-22 – 2018-05-23 (×2): 20 mg via ORAL
  Filled 2018-05-21 (×2): qty 1

## 2018-05-21 MED ORDER — VITAMIN K 100 MCG PO TABS: 100.0000 ug | ORAL_TABLET | Freq: Every day | ORAL | Status: DC

## 2018-05-21 MED ORDER — ALBUTEROL SULFATE (2.5 MG/3ML) 0.083% IN NEBU
2.5000 mg | INHALATION_SOLUTION | RESPIRATORY_TRACT | Status: DC | PRN
Start: 2018-05-21 — End: 2018-05-23

## 2018-05-21 MED ORDER — DOXYCYCLINE HYCLATE 100 MG PO TABS
100.0000 mg | ORAL_TABLET | Freq: Two times a day (BID) | ORAL | Status: DC
  Administered 2018-05-22: 100 mg via ORAL
  Filled 2018-05-21 (×2): qty 1

## 2018-05-21 MED ORDER — MONTELUKAST SODIUM 10 MG PO TABS
10.0000 mg | ORAL_TABLET | Freq: Every day | ORAL | Status: DC
  Administered 2018-05-22 (×2): 10 mg via ORAL
  Filled 2018-05-21 (×2): qty 1

## 2018-05-21 MED ORDER — FERROUS SULFATE 325 (65 FE) MG PO TABS
325.0000 mg | ORAL_TABLET | Freq: Every day | ORAL | Status: DC
  Administered 2018-05-22: 09:00:00 325 mg via ORAL
  Filled 2018-05-21: qty 1

## 2018-05-21 MED ORDER — VITAMIN B-12 1000 MCG PO TABS
1000.0000 ug | ORAL_TABLET | Freq: Every day | ORAL | Status: DC
  Administered 2018-05-22 – 2018-05-23 (×2): 1000 ug via ORAL
  Filled 2018-05-21 (×2): qty 1

## 2018-05-21 MED ORDER — ONDANSETRON HCL 4 MG PO TABS: 4.0000 mg | ORAL_TABLET | Freq: Four times a day (QID) | ORAL | Status: DC | PRN

## 2018-05-21 MED ORDER — MAGNESIUM OXIDE 400 (241.3 MG) MG PO TABS
400.0000 mg | ORAL_TABLET | Freq: Every day | ORAL | Status: DC
Start: 2018-05-21 — End: 2018-05-23
  Administered 2018-05-22 – 2018-05-23 (×2): 400 mg via ORAL
  Filled 2018-05-21 (×2): qty 1

## 2018-05-21 NOTE — Progress Notes (Signed)
CODE SEPSIS - PHARMACY COMMUNICATION  **Broad Spectrum Antibiotics should be administered within 1 hour of Sepsis diagnosis**  Time Code Sepsis Called/Page Received: 1658- no page- just order for suspected sepsis  Antibiotics Ordered: none  Time of 1st antibiotic administration:  none  Additional action taken by pharmacy: called RN- no page and no antibiotics ordered. Per RN not a true sepsis  If necessary, Name of Provider/Nurse Contacted:      Noralee Space ,PharmD Clinical Pharmacist  05/21/2018  4:52 PM

## 2018-05-21 NOTE — Progress Notes (Signed)

## 2018-05-21 NOTE — ED Notes (Signed)
Report

## 2018-05-21 NOTE — ED Notes (Signed)
Pt unable to walk - off oxygen and up to the side of the bed her oxygen dropped to 86% and she was feeling winded.

## 2018-05-21 NOTE — ED Provider Notes (Signed)
[facility] Emergency Department Provider Note    First MD Initiated Contact with Patient 05/21/18 [phone]     (approximate)  I have reviewed the triage vital signs and the nursing notes.   HISTORY  Chief Complaint Fever and Tick Removal    HPI Rachael Blankenship is a 62 y.o. female presents the ER with chief complaint of fever and chills since noon yesterday.  Patient states that she pulled 3-4 ticks off of her body over 1 month ago.  States that she is also felt some worsening shortness of breath and does wear home oxygen at night for known COPD.  Was previously on a course of Augmentin several weeks ago.  Denies any abdominal pain.  No dysuria.  No rashes.  No travel outside of the county.  Denies any chest pain or pressure at this time.    Past Medical History:  Diagnosis Date  . COPD (chronic obstructive pulmonary disease) ([place])   . Endometriosis   . GERD (gastroesophageal reflux disease)   . Hyperlipidemia   . Kidney stones    Family History  Problem Relation Age of Onset  . Hypertension Mother   . Hyperlipidemia Mother   . GER disease Mother   . Heart attack Father        23 yrs ago  . Hypertension Father   . Parkinson's disease Father   . GER disease Father    Past Surgical History:  Procedure Laterality Date  . KIDNEY STONE SURGERY    . TONSILLECTOMY     Patient Active Problem List   Diagnosis Date Noted  . Screening for breast cancer 05/13/2018  . Acute upper respiratory infection 05/13/2018  . Other fatigue 05/13/2018  . Generalized anxiety disorder 05/13/2018  . Vitamin D deficiency 05/13/2018  . Screening for malignant neoplasm of cervix 05/13/2018  . Dysuria 05/13/2018      Prior to Admission medications   Medication Sig Start Date End Date Taking? Authorizing Provider  alendronate (FOSAMAX) 70 MG tablet TAKE 1 TABLET BY MOUTH EVERY WEEK 03/03/18   Ronnell Freshwater, NP  ALPRAZolam (XANAX) 0.25 MG tablet Take 1 tablet (0.25  mg total) by mouth 3 (three) times daily as needed for anxiety. 04/24/18   Ronnell Freshwater, NP  amoxicillin-clavulanate (AUGMENTIN) 875-125 MG tablet Take 1 tablet by mouth 2 (two) times daily. 04/30/18   Ronnell Freshwater, NP  ascorbic acid (VITAMIN C) 500 MG tablet Take 500 mg by mouth daily.    [provider]  aspirin 81 MG chewable tablet Chew by mouth.    [provider]  aspirin 81 MG tablet Take 81 mg by mouth daily.    [provider]  cholecalciferol (VITAMIN D) 400 units TABS tablet Take 1,000 Units by mouth.    [provider]  Cholecalciferol (VITAMIN D-1000 MAX ST) 1000 units tablet Take by mouth.    [provider]  citalopram (CELEXA) 20 MG tablet Take 20 mg by mouth daily.    [provider]  citalopram (CELEXA) 20 MG tablet Take by mouth.    [provider]  co-enzyme Q-10 50 MG capsule Take 100 mg by mouth daily.    [provider]  Coenzyme Q10 (CO Q-10) 100 MG CAPS Take by mouth.    [provider]  furosemide (LASIX) 40 MG tablet Take 40 mg by mouth.    [provider]  furosemide (LASIX) 40 MG tablet take 1 tablet by mouth once daily  05/26/17   [provider]  ibuprofen (ADVIL,MOTRIN) 800 MG tablet Take 800 mg by mouth every 8 (eight) hours as needed.    [provider]  Ipratropium-Albuterol (COMBIVENT RESPIMAT) 20-100 MCG/ACT AERS respimat Inhale 1 puff into the lungs every 6 (six) hours.    [provider]  ipratropium-albuterol (DUONEB) 0.5-2.5 (3) MG/3ML SOLN Use 1 vial via nebulizer every 4 t0 6 hrs 02/23/18   Allyne Gee, MD  Iron-Vitamin C (IRON 100/C PO) Take by mouth.    [provider]  levofloxacin (LEVAQUIN) 500 MG tablet Take 1 tablet (500 mg total) by mouth daily. For 10 days 02/17/18   Lavera Guise, MD  Magnesium 250 MG TABS Take by mouth.    [provider]  MAGNESIUM PO Take by mouth.    [provider]    mometasone-formoterol (DULERA) 100-5 MCG/ACT AERO Inhale 2 puffs into the lungs 2 (two) times daily.    [provider]  montelukast (SINGULAIR) 10 MG tablet TAKE 1 TABLET BY MOUTH EVERY DAY FOR RHINITIS OR ALLERGIES 05/13/18   Lavera Guise, MD  Multiple Vitamin (MULTIVITAMIN) capsule Take 1 capsule by mouth daily.    [provider]  omeprazole (PRILOSEC) 40 MG capsule Take 40 mg by mouth daily.    [provider]  Potassium Chloride CR (MICRO-K) 8 MEQ CPCR capsule CR Take 1 cap po daily may take extra 2-3 times a week 05/01/18   Ronnell Freshwater, NP  simvastatin (ZOCOR) 20 MG tablet TAKE 1 TABLET BY MOUTH EVERY EVENING FOR CHOLESTEROL 04/30/18   Ronnell Freshwater, NP  SOYBEAN PO Take by mouth.    [provider]  tiotropium (SPIRIVA) 18 MCG inhalation capsule Place 18 mcg into inhaler and inhale daily.    [provider]  vitamin B-12 (CYANOCOBALAMIN) 1000 MCG tablet Take 1,000 mcg by mouth daily.    [provider]  vitamin B-12 (CYANOCOBALAMIN) 1000 MCG tablet Take by mouth.    [provider]  vitamin C (ASCORBIC ACID) 500 MG tablet Take by mouth.    [provider]  vitamin E 400 UNIT capsule Take 400 Units by mouth daily.    [provider]  vitamin k 100 MCG tablet Take 100 mcg by mouth daily.    [provider]    Allergies Naproxen; Nsaids; and Sulfa antibiotics    Social History Social History   Tobacco Use  . Smoking status: Current Every Day Smoker    Packs/day: 0.50    Types: Cigarettes    Start date: 11/09/2017  . Smokeless tobacco: Never Used  Substance Use Topics  . Alcohol use: Yes    Comment: socially  . Drug use: No    Review of Systems Patient denies headaches, rhinorrhea, blurry vision, numbness, shortness of breath, chest pain, edema, cough, abdominal pain, nausea, vomiting, diarrhea, dysuria, fevers, rashes or hallucinations unless otherwise stated above in  HPI. ____________________________________________   PHYSICAL EXAM:  VITAL SIGNS: Vitals:   05/21/18 2000 05/21/18 2024  BP: (!) 102/52   Pulse: 94 (!) 102  Resp: (!) 23   Temp:    SpO2: 92% (!) 89%    Constitutional: Alert and oriented.  Eyes: Conjunctivae are normal.  Head: Atraumatic. Nose: No congestion/rhinnorhea. Mouth/Throat: Mucous membranes are moist.   Neck: No stridor. Painless ROM.  Cardiovascular: Normal rate, regular rhythm. Grossly normal heart sounds.  Good peripheral circulation. Respiratory: Normal respiratory effort.  No retractions. Lungs with coarse expiratory wheeze bilaterally  Gastrointestinal: Soft and nontender. No distention. No abdominal bruits. No CVA tenderness. Genitourinary: deferred Musculoskeletal: No lower extremity tenderness nor edema.  No joint effusions. Neurologic:  Normal speech and language. No gross focal neurologic deficits are appreciated. No facial droop Skin:  Skin is warm, dry and intact. No rash noted. Psychiatric: Mood and affect are normal. Speech and behavior are normal.  ____________________________________________   LABS (all labs ordered are listed, but only abnormal results are displayed)  Results for orders placed or performed during the hospital encounter of 05/21/18 (from the past 24 hour(s))  Lactic acid, plasma     Status: None   Collection Time: 05/21/18  4:41 PM  Result Value Ref Range   Lactic Acid, Venous 1.5 0.5 - 1.9 mmol/L  Comprehensive metabolic panel     Status: Abnormal   Collection Time: 05/21/18  4:41 PM  Result Value Ref Range   Sodium 133 (L) 135 - 145 mmol/L   Potassium 3.3 (L) 3.5 - 5.1 mmol/L   Chloride 103 98 - 111 mmol/L   CO2 20 (L) 22 - 32 mmol/L   Glucose, Bld 150 (H) 70 - 99 mg/dL   BUN 12 8 - 23 mg/dL   Creatinine, Ser 0.48 0.44 - 1.00 mg/dL   Calcium 8.2 (L) 8.9 - 10.3 mg/dL   Total Protein 7.1 6.5 - 8.1 g/dL   Albumin 3.6 3.5 - 5.0 g/dL   AST 21 15 - 41 U/L   ALT 14 0 - 44 U/L    Alkaline Phosphatase 86 38 - 126 U/L   Total Bilirubin 1.2 0.3 - 1.2 mg/dL   GFR calc non Af Amer >60 >60 mL/min   GFR calc Af Amer >60 >60 mL/min   Anion gap 10 5 - 15  Troponin I     Status: None   Collection Time: 05/21/18  4:41 PM  Result Value Ref Range   Troponin I <0.03 <0.03 ng/mL  CBC WITH DIFFERENTIAL     Status: Abnormal   Collection Time: 05/21/18  4:41 PM  Result Value Ref Range   WBC 19.1 (H) 3.6 - 11.0 K/uL   RBC 5.31 (H) 3.80 - 5.20 MIL/uL   Hemoglobin 15.3 12.0 - 16.0 g/dL   HCT 45.2 35.0 - 47.0 %   MCV 85.2 80.0 - 100.0 fL   MCH 28.8 26.0 - 34.0 pg   MCHC 33.8 32.0 - 36.0 g/dL   RDW 14.6 (H) 11.5 - 14.5 %   Platelets 232 150 - 440 K/uL   Neutrophils Relative % 90 %   Neutro Abs 17.0 (H) 1.4 - 6.5 K/uL   Lymphocytes Relative 4 %   Lymphs Abs 0.8 (L) 1.0 - 3.6 K/uL   Monocytes Relative 6 %   Monocytes Absolute 1.2 (H) 0.2 - 0.9 K/uL   Eosinophils Relative 0 %   Eosinophils Absolute 0.0 0 - 0.7 K/uL   Basophils Relative 0 %   Basophils Absolute 0.1 0 - 0.1 K/uL  Urinalysis, Complete w Microscopic     Status: Abnormal   Collection Time: 05/21/18  5:59 PM  Result Value Ref Range   Color, Urine YELLOW (A) YELLOW   APPearance CLEAR (A) CLEAR   Specific Gravity, Urine 1.006 1.005 - 1.030   pH 6.0 5.0 - 8.0   Glucose, UA 50 (A) NEGATIVE mg/dL   Hgb urine dipstick NEGATIVE NEGATIVE   Bilirubin Urine NEGATIVE NEGATIVE   Ketones, ur NEGATIVE NEGATIVE mg/dL   Protein, ur NEGATIVE NEGATIVE mg/dL   Nitrite  NEGATIVE NEGATIVE   Leukocytes, UA NEGATIVE NEGATIVE   RBC / HPF 0-5 0 - 5 RBC/hpf   WBC, UA 0-5 0 - 5 WBC/hpf   Bacteria, UA NONE SEEN NONE SEEN   Squamous Epithelial / LPF NONE SEEN 0 - 5   ____________________________________________  EKG My review and personal interpretation at Time: 16:29   Indication: fevers and chills  Rate: 80  Rhythm: sinus Axis: normal Other: normal intervals ____________________________________________  RADIOLOGY  I  personally reviewed all radiographic images ordered to evaluate for the above acute complaints and reviewed radiology reports and findings.  These findings were personally discussed with the patient.  Please see medical record for radiology report.  ____________________________________________   PROCEDURES  Procedure(s) performed:  .Critical Care Performed by: Merlyn Lot, MD Authorized by: Merlyn Lot, MD   Critical care provider statement:    Critical care time (minutes):  5   Critical care time was exclusive of:  Separately billable procedures and treating other patients   Critical care was necessary to treat or prevent imminent or life-threatening deterioration of the following conditions:  Respiratory failure   Critical care was time spent personally by me on the following activities:  Development of treatment plan with patient or surrogate, discussions with consultants, evaluation of patient's response to treatment, examination of patient, obtaining history from patient or surrogate, ordering and performing treatments and interventions, ordering and review of laboratory studies, ordering and review of radiographic studies, pulse oximetry, re-evaluation of patient's condition and review of old charts      Critical Care performed: no ____________________________________________   INITIAL IMPRESSION / ASSESSMENT AND PLAN / ED COURSE  Pertinent labs & imaging results that were available during my care of the patient were reviewed by me and considered in my medical decision making (see chart for details).   DDX: pna, rmsf, lyme, copd, chf, uti, bacteremia  Rachael Blankenship is a 62 y.o. who presents to the ED with symptoms as described above.  Reported fevers at home does have new hypoxia with wheeze.  Blood work does show evidence of leukocytosis.  Certainly concerning for bronchitis, COPD exacerbation.  No evidence of congestive heart failure or effusion.  Not clinically  consistent with PE.  RMSF is also on the differential and therefore will start with doxycycline but will give nebulizer treatments as well as IV steroids.  Clinical Course as of May 21 2104  Thu May 21, 2018  2040 Without even walking patient became hypoxic when removing her supplemental oxygen.  Patient desaturated down to 86%.  Was placed back on supple oxygen.  At this point do believe patient will require hospitalization.   [PR]    Clinical Course User Index [PR] Merlyn Lot, MD     As part of my medical decision making, I reviewed the following data within the [place] notes reviewed and incorporated, Labs reviewed, notes from prior ED visits.  ____________________________________________   FINAL CLINICAL IMPRESSION(S) / ED DIAGNOSES  Final diagnoses:  Acute respiratory failure with hypoxia ([place])      NEW MEDICATIONS STARTED DURING THIS VISIT:  New Prescriptions   No medications on file     Note:  This document was prepared using Dragon voice recognition software and may include unintentional dictation errors.    Merlyn Lot, MD 05/21/18 2105

## 2018-05-21 NOTE — Progress Notes (Signed)
[facility] [place], [place] [address]  Internal MEDICINE  Office Visit Note  Patient Name: Rachael Blankenship  [id number]  [id number]  Date of Service: 05/21/2018  Chief Complaint  Patient presents with  . Fever    pt had tick bites   . Chills    HPI Pt is a walk in today with her husband. C/o fever and chills since yesterday, denies any cough or congestion, does feel sob. Pt recalls few tick bites in the last few weeks. She was not treated for them.   Current Medication: Outpatient Encounter Medications as of 05/21/2018  Medication Sig  . alendronate (FOSAMAX) 70 MG tablet TAKE 1 TABLET BY MOUTH EVERY WEEK  . ALPRAZolam (XANAX) 0.25 MG tablet Take 1 tablet (0.25 mg total) by mouth 3 (three) times daily as needed for anxiety.  Mar[place] Kitchen amoxicillin-clavulanate (AUGMENTIN) 875-125 MG tablet Take 1 tablet by mouth 2 (two) times daily.  Marland Kitchen ascorbic acid (VITAMIN C) 500 MG tablet Take 500 mg by mouth daily.  Marland Kitchen aspirin 81 MG chewable tablet Chew by mouth.  Marland Kitchen aspirin 81 MG tablet Take 81 mg by mouth daily.  . cholecalciferol (VITAMIN D) 400 units TABS tablet Take 1,000 Units by mouth.  . Cholecalciferol (VITAMIN D-1000 MAX ST) 1000 units tablet Take by mouth.  . citalopram (CELEXA) 20 MG tablet Take 20 mg by mouth daily.  . citalopram (CELEXA) 20 MG tablet Take by mouth.  . co-enzyme Q-10 50 MG capsule Take 100 mg by mouth daily.  . Coenzyme Q10 (CO Q-10) 100 MG CAPS Take by mouth.  . furosemide (LASIX) 40 MG tablet Take 40 mg by mouth.  . furosemide (LASIX) 40 MG tablet take 1 tablet by mouth once daily  . ibuprofen (ADVIL,MOTRIN) 800 MG tablet Take 800 mg by mouth every 8 (eight) hours as needed.  . Ipratropium-Albuterol (COMBIVENT RESPIMAT) 20-100 MCG/ACT AERS respimat Inhale 1 puff into the lungs every 6 (six) hours.  Mar[place] Kitchen ipratropium-albuterol (DUONEB) 0.5-2.5 (3) MG/3ML SOLN Use 1 vial via nebulizer every 4 t0 6 hrs  . Iron-Vitamin C (IRON 100/C PO) Take by mouth.   . levofloxacin (LEVAQUIN) 500 MG tablet Take 1 tablet (500 mg total) by mouth daily. For 10 days  . Magnesium 250 MG TABS Take by mouth.  Marland Kitchen MAGNESIUM PO Take by mouth.  . mometasone-formoterol (DULERA) 100-5 MCG/ACT AERO Inhale 2 puffs into the lungs 2 (two) times daily.  . montelukast (SINGULAIR) 10 MG tablet TAKE 1 TABLET BY MOUTH EVERY DAY FOR RHINITIS OR ALLERGIES  . Multiple Vitamin (MULTIVITAMIN) capsule Take 1 capsule by mouth daily.  Mar[place] Kitchen omeprazole (PRILOSEC) 40 MG capsule Take 40 mg by mouth daily.  . Potassium Chloride CR (MICRO-K) 8 MEQ CPCR capsule CR Take 1 cap po daily may take extra 2-3 times a week  . simvastatin (ZOCOR) 20 MG tablet TAKE 1 TABLET BY MOUTH EVERY EVENING FOR CHOLESTEROL  . SOYBEAN PO Take by mouth.  . tiotropium (SPIRIVA) 18 MCG inhalation capsule Place 18 mcg into inhaler and inhale daily.  . vitamin B-12 (CYANOCOBALAMIN) 1000 MCG tablet Take 1,000 mcg by mouth daily.  . vitamin B-12 (CYANOCOBALAMIN) 1000 MCG tablet Take by mouth.  . vitamin C (ASCORBIC ACID) 500 MG tablet Take by mouth.  . vitamin E 400 UNIT capsule Take 400 Units by mouth daily.  . vitamin k 100 MCG tablet Take 100 mcg by mouth daily.   No facility-administered encounter medications on file as of 05/21/2018.  Surgical History: Past Surgical History:  Procedure Laterality Date  . KIDNEY STONE SURGERY    . TONSILLECTOMY      Medical History: Past Medical History:  Diagnosis Date  . COPD (chronic obstructive pulmonary disease) ([place])   . Endometriosis   . GERD (gastroesophageal reflux disease)   . Hyperlipidemia   . Kidney stones     Family History: Family History  Problem Relation Age of Onset  . Hypertension Mother   . Hyperlipidemia Mother   . GER disease Mother   . Heart attack Father        23 yrs ago  . Hypertension Father   . Parkinson's disease Father   . GER disease Father     Social History   Socioeconomic History  . Marital status: Married    Spouse  name: Not on file  . Number of children: Not on file  . Years of education: Not on file  . Highest education level: Not on file  Occupational History  . Not on file  Social Needs  . Financial resource strain: Not on file  . Food insecurity:    Worry: Not on file    Inability: Not on file  . Transportation needs:    Medical: Not on file    Non-medical: Not on file  Tobacco Use  . Smoking status: Current Every Day Smoker    Packs/day: 0.50    Types: Cigarettes    Start date: 11/09/2017  . Smokeless tobacco: Never Used  Substance and Sexual Activity  . Alcohol use: Yes    Comment: socially  . Drug use: No  . Sexual activity: Not on file  Lifestyle  . Physical activity:    Days per week: Not on file    Minutes per session: Not on file  . Stress: Not on file  Relationships  . Social connections:    Talks on phone: Not on file    Gets together: Not on file    Attends religious service: Not on file    Active member of club or organization: Not on file    Attends meetings of clubs or organizations: Not on file    Relationship status: Not on file  . Intimate partner violence:    Fear of current or ex partner: Not on file    Emotionally abused: Not on file    Physically abused: Not on file    Forced sexual activity: Not on file  Other Topics Concern  . Not on file  Social History Narrative  . Not on file      Review of Systems  Constitutional: Positive for chills, fatigue and fever. Negative for diaphoresis.  HENT: Negative for ear pain, postnasal drip and sinus pressure.   Eyes: Negative for photophobia, discharge, redness, itching and visual disturbance.  Respiratory: Positive for chest tightness and shortness of breath. Negative for cough and wheezing.   Cardiovascular: Negative for chest pain, palpitations and leg swelling.  Gastrointestinal: Negative for constipation, diarrhea, nausea and vomiting.  Genitourinary: Negative for dysuria and flank pain.   Musculoskeletal: Positive for arthralgias and myalgias. Negative for back pain, gait problem and neck pain.  Skin: Negative for color change.  Allergic/Immunologic: Negative for environmental allergies and food allergies.  Neurological: Negative for dizziness and headaches.  Hematological: Does not bruise/bleed easily.  Psychiatric/Behavioral: Negative for agitation, behavioral problems (depression) and hallucinations.   Vital Signs: BP 106/87   Pulse 99   Resp 16   Ht 5\' 4"  (1.626 m)  Wt 125 lb (56.7 kg)   SpO2 (!) 88%   BMI 21.46 kg/m    Physical Exam  Constitutional: She is oriented to person, place, and time. She appears well-developed.  Toxic look  Eyes: Pupils are equal, round, and reactive to light.  Neck: Normal range of motion. Neck supple.  Cardiovascular: Normal rate and regular rhythm.  Pulmonary/Chest: Effort normal.  Neurological: She is alert and oriented to person, place, and time.  Skin: Skin is warm. She is diaphoretic.  Psychiatric: She has a normal mood and affect.   Assessment/Plan: 1. Fever and chills Pt is sent to ED for further eval  2. Tick bite, sequela Will need lyme's titers  3. Hypoxemia Will need CXR   General Counseling: Amere verbalizes understanding of the findings of todays visit and agrees with plan of treatment. I have discussed any further diagnostic evaluation that may be needed or ordered today. We also reviewed her medications today. she has been encouraged to call the office with any questions or concerns that should arise related to todays visit.  Time spent:20 Minutes   Dr Lavera Guise Internal medicine

## 2018-05-21 NOTE — ED Triage Notes (Signed)
First Nurse Note:  Arrives from Dr. Magnus Sinning office for evaluation of fever and tick bites.

## 2018-05-21 NOTE — ED Notes (Signed)
Pt sleeping soundly in no distress. Hold duoneb for now.

## 2018-05-21 NOTE — ED Triage Notes (Signed)
Pt presents to ED via POV with c/o fever since 12pm yesterday after pulling 3-4 ticks off of her, pt's SO reports has been using Tylenol since then to control fevers. Pt reports generalized weakness. Pt states was given course of Augmentin that she finished. Pt's RA sat 88%.

## 2018-05-21 NOTE — Progress Notes (Signed)

## 2018-05-22 ENCOUNTER — Encounter: Payer: Self-pay | Admitting: *Deleted

## 2018-05-22 LAB — RESPIRATORY PANEL BY PCR
Adenovirus: NOT DETECTED
BORDETELLA PERTUSSIS-RVPCR: NOT DETECTED
CORONAVIRUS 229E-RVPPCR: NOT DETECTED
Chlamydophila pneumoniae: NOT DETECTED
Coronavirus HKU1: NOT DETECTED
Coronavirus NL63: NOT DETECTED
Coronavirus OC43: NOT DETECTED
INFLUENZA B-RVPPCR: NOT DETECTED
Influenza A: NOT DETECTED
METAPNEUMOVIRUS-RVPPCR: NOT DETECTED
MYCOPLASMA PNEUMONIAE-RVPPCR: NOT DETECTED
Parainfluenza Virus 1: NOT DETECTED
Parainfluenza Virus 2: NOT DETECTED
Parainfluenza Virus 3: NOT DETECTED
Parainfluenza Virus 4: NOT DETECTED
RESPIRATORY SYNCYTIAL VIRUS-RVPPCR: NOT DETECTED
Rhinovirus / Enterovirus: NOT DETECTED

## 2018-05-22 LAB — HEMOGLOBIN A1C
Hgb A1c MFr Bld: 6.2 % — ABNORMAL HIGH (ref 4.8–5.6)
Mean Plasma Glucose: 131.24 mg/dL

## 2018-05-22 LAB — LACTIC ACID, PLASMA
Lactic Acid, Venous: 2.9 mmol/L (ref 0.5–1.9)
Lactic Acid, Venous: 1 mmol/L (ref 0.5–1.9)

## 2018-05-22 LAB — URINE CULTURE

## 2018-05-22 LAB — TSH: TSH: 0.13 u[IU]/mL — ABNORMAL LOW (ref 0.350–4.500)

## 2018-05-22 MED ORDER — BUDESONIDE 0.5 MG/2ML IN SUSP
0.5000 mg | Freq: Two times a day (BID) | RESPIRATORY_TRACT | Status: DC
  Administered 2018-05-22 – 2018-05-23 (×2): 0.5 mg via RESPIRATORY_TRACT
  Filled 2018-05-22 (×2): qty 2

## 2018-05-22 MED ORDER — FERROUS SULFATE 325 (65 FE) MG PO TABS: 325.0000 mg | ORAL_TABLET | Freq: Every day | ORAL | Status: DC

## 2018-05-22 MED ORDER — METHYLPREDNISOLONE SODIUM SUCC 40 MG IJ SOLR
40.0000 mg | Freq: Two times a day (BID) | INTRAMUSCULAR | Status: DC
  Administered 2018-05-22 – 2018-05-23 (×2): 40 mg via INTRAVENOUS
  Filled 2018-05-22 (×2): qty 1

## 2018-05-22 MED ORDER — DOXYCYCLINE HYCLATE 100 MG PO TABS
100.0000 mg | ORAL_TABLET | Freq: Two times a day (BID) | ORAL | Status: DC
  Administered 2018-05-22 – 2018-05-23 (×2): 100 mg via ORAL
  Filled 2018-05-22 (×3): qty 1

## 2018-05-22 MED ORDER — RACEPINEPHRINE HCL 2.25 % IN NEBU: 0.5000 mL | INHALATION_SOLUTION | Freq: Once | RESPIRATORY_TRACT | Status: DC

## 2018-05-22 MED ORDER — IPRATROPIUM-ALBUTEROL 0.5-2.5 (3) MG/3ML IN SOLN
3.0000 mL | Freq: Four times a day (QID) | RESPIRATORY_TRACT | Status: DC
  Administered 2018-05-22 – 2018-05-23 (×3): 3 mL via RESPIRATORY_TRACT
  Filled 2018-05-22 (×4): qty 3

## 2018-05-22 NOTE — Progress Notes (Signed)
Doxycycline has been modified from BID to BID with meals to avoid ADE/GI upset. Ferrous sulfate has been modified from daily with breakfast to daily with lunch to avoid drug-drug interaction with doxycycline. Please do not give doxycycline and ferrous sulfate at the same time - give doxycycline 2 hours before or 4 hours after divalent cations.  Kupono Marling A. [place], [place].D., BCPS Clinical Pharmacist 05/22/2018 10:34

## 2018-05-22 NOTE — Progress Notes (Signed)
Patient ID: Rachael Blankenship, female   DOB: [birth year], 62 y.o.   MRN: [id number]  [facility] PROGRESS NOTE  Rachael Blankenship EVO:[id number] DOB: 29-Jun-1956 DOA: 05/21/2018 PCP: Lavera Guise, MD  HPI/Subjective: Patient feeling a little bit better.  Had a fever when she came in.  Also some shortness of breath.  Some cough but not much production.  She states she was bitten by a tick a few weeks back.  Objective: Vitals:   05/21/18 2342 05/22/18 0503  BP: 116/72 119/69  Pulse: 72 83  Resp: 16 19  Temp: (!) 97.5 F (36.4 C) 98.4 F (36.9 C)  SpO2: 94% 94%    Filed Weights   05/21/18 1622 05/21/18 2342 05/22/18 0503  Weight: 56.7 kg (125 lb) 57.6 kg (127 lb) 57.6 kg (127 lb)    ROS: Review of Systems  Constitutional: Positive for fever. Negative for chills.  Eyes: Negative for blurred vision.  Respiratory: Positive for cough and shortness of breath.   Cardiovascular: Negative for chest pain.  Gastrointestinal: Negative for abdominal pain, constipation, diarrhea, nausea and vomiting.  Genitourinary: Negative for dysuria.  Musculoskeletal: Negative for joint pain.  Neurological: Negative for dizziness and headaches.   Exam: Physical Exam  Constitutional: She is oriented to person, place, and time.  HENT:  Nose: No mucosal edema.  Mouth/Throat: No oropharyngeal exudate or posterior oropharyngeal edema.  Eyes: Pupils are equal, round, and reactive to light. Conjunctivae, EOM and lids are normal.  Neck: No JVD present. Carotid bruit is not present. No edema present. No thyroid mass and no thyromegaly present.  Cardiovascular: S1 normal and S2 normal. Exam reveals no gallop.  No murmur heard. Pulses:      Dorsalis pedis pulses are 2+ on the right side, and 2+ on the left side.  Respiratory: No respiratory distress. She has decreased breath sounds in the right middle field, the right lower field, the left middle field and the left lower field. She has wheezes in the right  middle field and the left middle field. She has rhonchi in the right lower field and the left lower field. She has no rales.  GI: Soft. Bowel sounds are normal. There is no tenderness.  Musculoskeletal:       Right ankle: She exhibits no swelling.       Left ankle: She exhibits no swelling.  Lymphadenopathy:    She has no cervical adenopathy.  Neurological: She is alert and oriented to person, place, and time. No cranial nerve deficit.  Skin: Skin is warm. No rash noted. Nails show no clubbing.  Some bruising upper extremities.  Psychiatric: She has a normal mood and affect.      Data Reviewed: Basic Metabolic Panel: Recent Labs  Lab 05/21/18 1641  NA 133*  K 3.3*  CL 103  CO2 20*  GLUCOSE 150*  BUN 12  CREATININE 0.48  CALCIUM 8.2*   Liver Function Tests: Recent Labs  Lab 05/21/18 1641  AST 21  ALT 14  ALKPHOS 86  BILITOT 1.2  PROT 7.1  ALBUMIN 3.6   CBC: Recent Labs  Lab 05/21/18 1641  WBC 19.1*  NEUTROABS 17.0*  HGB 15.3  HCT 45.2  MCV 85.2  PLT 232   Cardiac Enzymes: Recent Labs  Lab 05/21/18 1641  TROPONINI <0.03     Recent Results (from the past 240 hour(s))  Blood Culture (routine x 2)     Status: None (Preliminary result)   Collection Time: 05/21/18  4:41 PM  Result Value Ref Range Status   Specimen Description BLOOD LEFT ANTECUBITAL  Final   Special Requests   Final    BOTTLES DRAWN AEROBIC AND ANAEROBIC Blood Culture results may not be optimal due to an inadequate volume of blood received in culture bottles   Culture   Final    NO GROWTH < 24 HOURS Performed at [facility], [address] [place], [place] [address]    Report Status PENDING  Incomplete  Respiratory Panel by PCR     Status: None   Collection Time: 05/22/18  8:20 AM  Result Value Ref Range Status   Adenovirus NOT DETECTED NOT DETECTED Final   Coronavirus 229E NOT DETECTED NOT DETECTED Final   Coronavirus HKU1 NOT DETECTED NOT DETECTED Final   Coronavirus  NL63 NOT DETECTED NOT DETECTED Final   Coronavirus OC43 NOT DETECTED NOT DETECTED Final   Metapneumovirus NOT DETECTED NOT DETECTED Final   Rhinovirus / Enterovirus NOT DETECTED NOT DETECTED Final   Influenza A NOT DETECTED NOT DETECTED Final   Influenza B NOT DETECTED NOT DETECTED Final   Parainfluenza Virus 1 NOT DETECTED NOT DETECTED Final   Parainfluenza Virus 2 NOT DETECTED NOT DETECTED Final   Parainfluenza Virus 3 NOT DETECTED NOT DETECTED Final   Parainfluenza Virus 4 NOT DETECTED NOT DETECTED Final   Respiratory Syncytial Virus NOT DETECTED NOT DETECTED Final   Bordetella pertussis NOT DETECTED NOT DETECTED Final   Chlamydophila pneumoniae NOT DETECTED NOT DETECTED Final   Mycoplasma pneumoniae NOT DETECTED NOT DETECTED Final    Comment: Performed at [facility], [place] [address] [place], [place] [address]     Studies: Dg Chest Port 1 View  Result Date: 05/21/2018 CLINICAL DATA:  Fever for 1 day, possible tick bites, generalized weakness EXAM: PORTABLE CHEST 1 VIEW COMPARISON:  Chest x-ray of 10/27/2017 FINDINGS: The lungs appear somewhat hyperaerated. No pneumonia or pleural effusion is seen. Mediastinal and hilar contours are unremarkable. The heart is within normal limits in size. No acute bony abnormality is seen. Old healed right seventh rib fracture is present posterolaterally. IMPRESSION: Slight hyper aeration.  No active lung disease. Electronically Signed   By: Ivar Drape M.D.   On: 05/21/2018 16:55    Scheduled Meds: . aspirin EC  81 mg Oral QHS  . budesonide (PULMICORT) nebulizer solution  0.5 mg Nebulization BID  . cholecalciferol  1,000 Units Oral Daily  . citalopram  20 mg Oral Daily  . docusate sodium  100 mg Oral BID  . doxycycline  100 mg Oral BID WC  . enoxaparin (LOVENOX) injection  40 mg Subcutaneous Q24H  . [START ON 05/23/2018] ferrous sulfate  325 mg Oral Q lunch  . furosemide  40 mg Oral Daily  . ipratropium-albuterol  3 mL Nebulization Once  .  ipratropium-albuterol  3 mL Nebulization Q6H  . magnesium oxide  400 mg Oral Daily  . montelukast  10 mg Oral QHS  . pantoprazole  80 mg Oral Daily  . simvastatin  20 mg Oral QPM  . tiotropium  18 mcg Inhalation Daily  . vitamin B-12  1,000 mcg Oral Daily  . ascorbic acid  500 mg Oral Daily  . vitamin E  400 Units Oral Daily    Assessment/Plan:   1. Acute hypoxic respiratory failure.  Pulse ox 87% on room air on presentation.  Oxygen supplementation given. 2. COPD exacerbation with poor air entry.  Switch to DuoNeb nebulizer solution and budesonide nebulizers.  Order Solu-Medrol  40 mg every 12 hours. 3. Hyponatremia.  Received IV fluid hydration. 4. Fever at home and prior tick bites was placed empirically on doxycycline. 5. Hyperlipidemia unspecified on simvastatin 6. Hyponatremia.  Continue to monitor  7. hypokalemia replace and recheck in the morning. 8. GERD on PPI  Code Status:     Code Status Orders  (From admission, onward)        Start     Ordered   05/21/18 2334  Full code  Continuous     05/21/18 2333    Code Status History    This patient has a current code status but no historical code status.     Disposition Plan: Home once moving better air  Antibiotics:  Doxycycline  Time spent: 28 minutes  [place]

## 2018-05-22 NOTE — H&P (Signed)
Rachael Blankenship is an 63 y.o. female.   Chief Complaint: Fever HPI: The patient with past medical history of COPD presents to the emergency department complaining of fever.  She also reports increased shortness of breath.  She usually wears oxygen only at night but has required submental oxygen during the day.  The patient also reports a recent tick bite.  Laboratory evaluation revealed hyponatremia as well as leukocytosis.  The patient was started on doxycycline for possible tickborne illness prior to the emergency department staff called the hospitalist service for further management.  Past Medical History:  Diagnosis Date  . COPD (chronic obstructive pulmonary disease) ([place])   . Endometriosis   . GERD (gastroesophageal reflux disease)   . Hyperlipidemia   . Kidney stones     Past Surgical History:  Procedure Laterality Date  . KIDNEY STONE SURGERY    . TONSILLECTOMY      Family History  Problem Relation Age of Onset  . Hypertension Mother   . Hyperlipidemia Mother   . GER disease Mother   . Heart attack Father        23 yrs ago  . Hypertension Father   . Parkinson's disease Father   . GER disease Father    Social History:  reports that she has been smoking cigarettes.  She started smoking about 6 months ago. She has been smoking about 1.00 pack per day. She has never used smokeless tobacco. She reports that she drinks alcohol. She reports that she does not use drugs.  Allergies:  Allergies  Allergen Reactions  . Naproxen Itching  . Nsaids Other (See Comments)    Other reaction(s): Unknown  . Sulfa Antibiotics Hives and Rash    Medications Prior to Admission  Medication Sig Dispense Refill  . alendronate (FOSAMAX) 70 MG tablet TAKE 1 TABLET BY MOUTH EVERY WEEK 12 tablet 0  . ALPRAZolam (XANAX) 0.25 MG tablet Take 1 tablet (0.25 mg total) by mouth 3 (three) times daily as needed for anxiety. 90 tablet 1  . ascorbic acid (VITAMIN C) 500 MG tablet Take 500 mg by mouth  daily.    Marland Kitchen aspirin 81 MG tablet Take 81 mg by mouth at bedtime.     . Cholecalciferol (VITAMIN D-1000 MAX ST) 1000 units tablet Take 1,000 Units by mouth daily.     . citalopram (CELEXA) 20 MG tablet Take 20 mg by mouth daily.    . Coenzyme Q10 (CO Q-10) 100 MG CAPS Take 100 mg by mouth daily.     . DULERA 200-5 MCG/ACT AERO Inhale 2 puffs into the lungs 2 (two) times daily.   2  . ferrous sulfate 325 (65 FE) MG tablet Take 325 mg by mouth daily with breakfast.    . furosemide (LASIX) 40 MG tablet Take 40 mg by mouth daily.     Marland Kitchen ibuprofen (ADVIL,MOTRIN) 800 MG tablet Take 800 mg by mouth every 8 (eight) hours as needed for mild pain.     . Ipratropium-Albuterol (COMBIVENT RESPIMAT) 20-100 MCG/ACT AERS respimat Inhale 1 puff into the lungs every 6 (six) hours as needed for wheezing or shortness of breath.     Marland Kitchen ipratropium-albuterol (DUONEB) 0.5-2.5 (3) MG/3ML SOLN Use 1 vial via nebulizer every 4 t0 6 hrs 360 mL 3  . Magnesium 400 MG TABS Take 400 mg by mouth daily.     . montelukast (SINGULAIR) 10 MG tablet TAKE 1 TABLET BY MOUTH EVERY DAY FOR RHINITIS OR ALLERGIES 30 tablet 0  .  omeprazole (PRILOSEC) 40 MG capsule Take 40 mg by mouth daily.    . Potassium Chloride CR (MICRO-K) 8 MEQ CPCR capsule CR Take 1 cap po daily may take extra 2-3 times a week (Patient taking differently: Take 8 mEq by mouth daily. Take 1 cap po daily may take extra 2-3 times a week) 45 capsule 3  . simvastatin (ZOCOR) 20 MG tablet TAKE 1 TABLET BY MOUTH EVERY EVENING FOR CHOLESTEROL 90 tablet 0  . tiotropium (SPIRIVA) 18 MCG inhalation capsule Place 18 mcg into inhaler and inhale daily.    . vitamin B-12 (CYANOCOBALAMIN) 1000 MCG tablet Take 1,000 mcg by mouth daily.    . vitamin E 400 UNIT capsule Take 400 Units by mouth daily.    . vitamin k 100 MCG tablet Take 100 mcg by mouth daily.    Marland Kitchen amoxicillin-clavulanate (AUGMENTIN) 875-125 MG tablet Take 1 tablet by mouth 2 (two) times daily. (Patient not taking: Reported  on 05/21/2018) 20 tablet 0  . levofloxacin (LEVAQUIN) 500 MG tablet Take 1 tablet (500 mg total) by mouth daily. For 10 days (Patient not taking: Reported on 05/21/2018) 10 tablet 0    Results for orders placed or performed during the hospital encounter of 05/21/18 (from the past 48 hour(s))  Lactic acid, plasma     Status: None   Collection Time: 05/21/18  4:41 PM  Result Value Ref Range   Lactic Acid, Venous 1.5 0.5 - 1.9 mmol/L    Comment: Performed at [facility], [address] [place], [place] [address]  Comprehensive metabolic panel     Status: Abnormal   Collection Time: 05/21/18  4:41 PM  Result Value Ref Range   Sodium 133 (L) 135 - 145 mmol/L   Potassium 3.3 (L) 3.5 - 5.1 mmol/L   Chloride 103 98 - 111 mmol/L    Comment: Please note change in reference range.   CO2 20 (L) 22 - 32 mmol/L   Glucose, Bld 150 (H) 70 - 99 mg/dL    Comment: Please note change in reference range.   BUN 12 8 - 23 mg/dL    Comment: Please note change in reference range.   Creatinine, Ser 0.48 0.44 - 1.00 mg/dL   Calcium 8.2 (L) 8.9 - 10.3 mg/dL   Total Protein 7.1 6.5 - 8.1 g/dL   Albumin 3.6 3.5 - 5.0 g/dL   AST 21 15 - 41 U/L   ALT 14 0 - 44 U/L    Comment: Please note change in reference range.   Alkaline Phosphatase 86 38 - 126 U/L   Total Bilirubin 1.2 0.3 - 1.2 mg/dL   GFR calc non Af Amer >60 >60 mL/min   GFR calc Af Amer >60 >60 mL/min    Comment: (NOTE) The eGFR has been calculated using the CKD EPI equation. This calculation has not been validated in all clinical situations. eGFR's persistently <60 mL/min signify possible Chronic Kidney Disease.    Anion gap 10 5 - 15    Comment: Performed at [facility], [address] [place], [place] [address]  Troponin I     Status: None   Collection Time: 05/21/18  4:41 PM  Result Value Ref Range   Troponin I <0.03 <0.03 ng/mL    Comment: Performed at [facility], [address] [place], [place] [address]   CBC WITH DIFFERENTIAL     Status: Abnormal   Collection Time: 05/21/18  4:41 PM  Result Value Ref Range  WBC 19.1 (H) 3.6 - 11.0 K/uL   RBC 5.31 (H) 3.80 - 5.20 MIL/uL   Hemoglobin 15.3 12.0 - 16.0 g/dL   HCT 45.2 35.0 - 47.0 %   MCV 85.2 80.0 - 100.0 fL   MCH 28.8 26.0 - 34.0 pg   MCHC 33.8 32.0 - 36.0 g/dL   RDW 14.6 (H) 11.5 - 14.5 %   Platelets 232 150 - 440 K/uL   Neutrophils Relative % 90 %   Neutro Abs 17.0 (H) 1.4 - 6.5 K/uL   Lymphocytes Relative 4 %   Lymphs Abs 0.8 (L) 1.0 - 3.6 K/uL   Monocytes Relative 6 %   Monocytes Absolute 1.2 (H) 0.2 - 0.9 K/uL   Eosinophils Relative 0 %   Eosinophils Absolute 0.0 0 - 0.7 K/uL   Basophils Relative 0 %   Basophils Absolute 0.1 0 - 0.1 K/uL    Comment: Performed at [facility], [address] [place], [place] [address]  Urinalysis, Complete w Microscopic     Status: Abnormal   Collection Time: 05/21/18  5:59 PM  Result Value Ref Range   Color, Urine YELLOW (A) YELLOW   APPearance CLEAR (A) CLEAR   Specific Gravity, Urine 1.006 1.005 - 1.030   pH 6.0 5.0 - 8.0   Glucose, UA 50 (A) NEGATIVE mg/dL   Hgb urine dipstick NEGATIVE NEGATIVE   Bilirubin Urine NEGATIVE NEGATIVE   Ketones, ur NEGATIVE NEGATIVE mg/dL   Protein, ur NEGATIVE NEGATIVE mg/dL   Nitrite NEGATIVE NEGATIVE   Leukocytes, UA NEGATIVE NEGATIVE   RBC / HPF 0-5 0 - 5 RBC/hpf   WBC, UA 0-5 0 - 5 WBC/hpf   Bacteria, UA NONE SEEN NONE SEEN   Squamous Epithelial / LPF NONE SEEN 0 - 5    Comment: Performed at [facility], [address] [place], [place] [address]  Lactic acid, plasma     Status: Abnormal   Collection Time: 05/22/18 12:02 AM  Result Value Ref Range   Lactic Acid, Venous 2.9 (HH) 0.5 - 1.9 mmol/L    Comment: CRITICAL RESULT CALLED TO, READ BACK BY AND VERIFIED WITH JAYCEY ZAUJA @0114  05/22/18 AKT Performed at [facility], [address] [place], [place] [address]   TSH     Status: Abnormal   Collection Time:  05/22/18 12:02 AM  Result Value Ref Range   TSH 0.130 (L) 0.350 - 4.500 uIU/mL    Comment: Performed by a 3rd Generation assay with a functional sensitivity of <=0.01 uIU/mL. Performed at [facility], [address] [place], [place] [address]    Dg Chest [place] 1 View  Result Date: 05/21/2018 CLINICAL DATA:  Fever for 1 day, possible tick bites, generalized weakness EXAM: PORTABLE CHEST 1 VIEW COMPARISON:  Chest x-ray of 10/27/2017 FINDINGS: The lungs appear somewhat hyperaerated. No pneumonia or pleural effusion is seen. Mediastinal and hilar contours are unremarkable. The heart is within normal limits in size. No acute bony abnormality is seen. Old healed right seventh rib fracture is present posterolaterally. IMPRESSION: Slight hyper aeration.  No active lung disease. Electronically Signed   By: Ivar Drape M.D.   On: 05/21/2018 16:55    Review of Systems  Constitutional: Negative for chills and fever.  HENT: Negative for sore throat and tinnitus.   Eyes: Negative for blurred vision and redness.  Respiratory: Positive for shortness of breath. Negative for cough.   Cardiovascular: Negative for chest pain, palpitations, orthopnea and PND.  Gastrointestinal: Negative for  abdominal pain, diarrhea, nausea and vomiting.  Genitourinary: Negative for dysuria, frequency and urgency.  Musculoskeletal: Negative for joint pain and myalgias.  Skin: Negative for rash.       No lesions  Neurological: Negative for speech change, focal weakness and weakness.  Endo/Heme/Allergies: Does not bruise/bleed easily.       No temperature intolerance  Psychiatric/Behavioral: Negative for depression and suicidal ideas.    Blood pressure 116/72, pulse 72, temperature (!) 97.5 F (36.4 C), temperature source Oral, resp. rate 16, height 5' 4"  (1.626 m), weight 57.6 kg (127 lb), SpO2 94 %. Physical Exam  Vitals reviewed. Constitutional: She is oriented to person, place, and time. She appears  well-developed and well-nourished. No distress. Nasal cannula in place.  HENT:  Head: Normocephalic and atraumatic.  Mouth/Throat: Oropharynx is clear and moist.  Eyes: Pupils are equal, round, and reactive to light. Conjunctivae and EOM are normal. No scleral icterus.  Neck: Normal range of motion. Neck supple. No JVD present. No tracheal deviation present. No thyromegaly present.  Cardiovascular: Normal rate, regular rhythm and normal heart sounds. Exam reveals no gallop and no friction rub.  No murmur heard. Respiratory: Effort normal and breath sounds normal.  GI: Soft. Bowel sounds are normal. She exhibits no distension. There is no tenderness.  Genitourinary:  Genitourinary Comments: Deferred  Musculoskeletal: Normal range of motion. She exhibits no edema.  Lymphadenopathy:    She has no cervical adenopathy.  Neurological: She is alert and oriented to person, place, and time. No cranial nerve deficit. She exhibits normal muscle tone.  Skin: Skin is warm and dry. No rash noted. No erythema.  Psychiatric: She has a normal mood and affect. Her behavior is normal. Judgment and thought content normal.     Assessment/Plan This is a 62 year old female admitted for respiratory failure. 1.  Respiratory failure: Acute on chronic; with hypoxia.  Chest x-ray is clear but the patient does have mild coughing concerning for developing pneumonia.  Supplemental oxygen to maintain oxygen saturations 88 to 92% 2.  Hyponatremia: Mild but somewhat supportive of possible tickborne illness.  Continue doxycycline. 3.  COPD: Moderately severe; continue Dulera and Spiriva.  Albuterol as needed 4.  Tobacco abuse: Ongoing; NicoDerm patch while hospitalized. Encouraged smoking cessation 5.  DVT prophylaxis: [place] 6.  GI prophylaxis: Pantoprazole per home regimen The patient is a full code.  Time spent on admission orders and patient care approximately 45 minutes  Harrie Foreman, MD 05/22/2018, 4:12  AM

## 2018-05-23 MED ORDER — TIOTROPIUM BROMIDE MONOHYDRATE 18 MCG IN CAPS: 18.0000 ug | ORAL_CAPSULE | Freq: Every day | RESPIRATORY_TRACT | 12 refills | Status: DC

## 2018-05-23 MED ORDER — DOXYCYCLINE HYCLATE 100 MG PO TABS: 100.0000 mg | ORAL_TABLET | Freq: Two times a day (BID) | ORAL | 0 refills | Status: AC

## 2018-05-23 MED ORDER — PREDNISONE 10 MG (21) PO TBPK: ORAL_TABLET | ORAL | 0 refills | Status: DC

## 2018-05-23 NOTE — Progress Notes (Signed)
Discharge instructions given and went over with patient at bedside. Prescriptions and follow-up appointments reviewed. All questions answered. Patient discharged home with husband via wheelchair by nursing staff. Madlyn Frankel, RN

## 2018-05-23 NOTE — Progress Notes (Signed)
Smoking cessation provided to the patient I strongly recommend that she stop smoking 4 minutes spent offered her nicotine patch she says she gets allergic reaction to that

## 2018-05-23 NOTE — Discharge Summary (Signed)
[facility] [place] [facility], 62 y.o., DOB 06-Oct-1956, MRN [id number]. Admission date: 05/21/2018 Discharge Date 05/23/2018 Primary MD Lavera Guise, MD Admitting Physician Harrie Foreman, MD  Admission Diagnosis  Acute respiratory failure with hypoxia [facility]) [J96.01]  Discharge Diagnosis   Active Problems: Acute respiratory failure with acute on chronic with hypoxia Acute bronchitis Acute on chronic COPD exasperation Hyponatremia Tobacco abuse   Hospital Course  Patient 62 year old presented with fever as well as worsening shortness of breath.  Patient was seen in the ED chest x-ray was negative.  She initially required oxygen.  We were able to wean her off the oxygen.  Her breathing is much improved.  She was treated for acute on chronic COPD exasperation as well as acute bronchitis. Patient will continue steroids at home as well as antibiotics.           Consults  None  Significant Tests:  See full reports for all details     Dg Chest Port 1 View  Result Date: 05/21/2018 CLINICAL DATA:  Fever for 1 day, possible tick bites, generalized weakness EXAM: PORTABLE CHEST 1 VIEW COMPARISON:  Chest x-ray of 10/27/2017 FINDINGS: The lungs appear somewhat hyperaerated. No pneumonia or pleural effusion is seen. Mediastinal and hilar contours are unremarkable. The heart is within normal limits in size. No acute bony abnormality is seen. Old healed right seventh rib fracture is present posterolaterally. IMPRESSION: Slight hyper aeration.  No active lung disease. Electronically Signed   By: Ivar Drape M.D.   On: 05/21/2018 16:55       Today   Subjective:   Rachael Blankenship breathing much better Objective:   Blood pressure (!) 115/59, pulse 70, temperature 97.8 F (36.6 C), temperature source Oral, resp. rate 18, height 5\' 4"  (1.626 m), weight 57.7 kg (127 lb 3 oz), SpO2 92 %.  . No intake or output data in the 24 hours ending 05/23/18  1541  Exam VITAL SIGNS: Blood pressure (!) 115/59, pulse 70, temperature 97.8 F (36.6 C), temperature source Oral, resp. rate 18, height 5\' 4"  (1.626 m), weight 57.7 kg (127 lb 3 oz), SpO2 92 %.  GENERAL:  62 y.o.-year-old patient lying in the bed with no acute distress.  EYES: Pupils equal, round, reactive to light and accommodation. No scleral icterus. Extraocular muscles intact.  HEENT: Head atraumatic, normocephalic. Oropharynx and nasopharynx clear.  NECK:  Supple, no jugular venous distention. No thyroid enlargement, no tenderness.  LUNGS: Normal breath sounds bilaterally, no wheezing, rales,rhonchi or crepitation. No use of accessory muscles of respiration.  CARDIOVASCULAR: S1, S2 normal. No murmurs, rubs, or gallops.  ABDOMEN: Soft, nontender, nondistended. Bowel sounds present. No organomegaly or mass.  EXTREMITIES: No pedal edema, cyanosis, or clubbing.  NEUROLOGIC: Cranial nerves II through XII are intact. Muscle strength 5/5 in all extremities. Sensation intact. Gait not checked.  PSYCHIATRIC: The patient is alert and oriented x 3.  SKIN: No obvious rash, lesion, or ulcer.   Data Review     CBC w Diff:  Lab Results  Component Value Date   WBC 19.1 (H) 05/21/2018   HGB 15.3 05/21/2018   HGB 15.4 04/29/2018   HCT 45.2 05/21/2018   HCT 44.1 04/29/2018   PLT 232 05/21/2018   PLT 292 04/29/2018   LYMPHOPCT 4 05/21/2018   MONOPCT 6 05/21/2018   EOSPCT 0 05/21/2018   BASOPCT 0 05/21/2018   CMP:  Lab Results  Component Value Date   NA 133 (  L) 05/21/2018   NA 142 04/29/2018   K 3.3 (L) 05/21/2018   CL 103 05/21/2018   CO2 20 (L) 05/21/2018   BUN 12 05/21/2018   BUN 8 04/29/2018   CREATININE 0.48 05/21/2018   PROT 7.1 05/21/2018   PROT 6.7 04/29/2018   ALBUMIN 3.6 05/21/2018   ALBUMIN 4.3 04/29/2018   BILITOT 1.2 05/21/2018   BILITOT 0.6 04/29/2018   ALKPHOS 86 05/21/2018   AST 21 05/21/2018   ALT 14 05/21/2018  .  Micro Results Recent Results (from the  past 240 hour(s))  Blood Culture (routine x 2)     Status: None (Preliminary result)   Collection Time: 05/21/18  4:41 PM  Result Value Ref Range Status   Specimen Description BLOOD LEFT ANTECUBITAL  Final   Special Requests   Final    BOTTLES DRAWN AEROBIC AND ANAEROBIC Blood Culture results may not be optimal due to an inadequate volume of blood received in culture bottles   Culture   Final    NO GROWTH 2 DAYS Performed at [facility], [address] [place], [place] [address]    Report Status PENDING  Incomplete  Urine culture     Status: Abnormal   Collection Time: 05/21/18  5:59 PM  Result Value Ref Range Status   Specimen Description   Final    URINE, RANDOM Performed at [facility], [address] [place], [place] [address]    Special Requests   Final    NONE Performed at [facility], [address] [place], [place] [address]    Culture (A)  Final    <10,000 COLONIES/mL INSIGNIFICANT GROWTH Performed at [facility], [place] [address] [place], [place] [address]    Report Status 05/22/2018 FINAL  Final  Respiratory Panel by PCR     Status: None   Collection Time: 05/22/18  8:20 AM  Result Value Ref Range Status   Adenovirus NOT DETECTED NOT DETECTED Final   Coronavirus 229E NOT DETECTED NOT DETECTED Final   Coronavirus HKU1 NOT DETECTED NOT DETECTED Final   Coronavirus NL63 NOT DETECTED NOT DETECTED Final   Coronavirus OC43 NOT DETECTED NOT DETECTED Final   Metapneumovirus NOT DETECTED NOT DETECTED Final   Rhinovirus / Enterovirus NOT DETECTED NOT DETECTED Final   Influenza A NOT DETECTED NOT DETECTED Final   Influenza B NOT DETECTED NOT DETECTED Final   Parainfluenza Virus 1 NOT DETECTED NOT DETECTED Final   Parainfluenza Virus 2 NOT DETECTED NOT DETECTED Final   Parainfluenza Virus 3 NOT DETECTED NOT DETECTED Final   Parainfluenza Virus 4 NOT DETECTED NOT DETECTED Final   Respiratory Syncytial Virus NOT DETECTED NOT DETECTED  Final   Bordetella pertussis NOT DETECTED NOT DETECTED Final   Chlamydophila pneumoniae NOT DETECTED NOT DETECTED Final   Mycoplasma pneumoniae NOT DETECTED NOT DETECTED Final    Comment: Performed at [facility], [place] [address], [place], [place] [address]        Code Status Orders  (From admission, onward)        Start     Ordered   05/21/18 2334  Full code  Continuous     05/21/18 2333    Code Status History    This patient has a current code status but no historical code status.          Follow-up Information    Lavera Guise, MD Follow up in 6 day(s).   Specialty:  Internal Medicine Contact  information: [place] [address] [phone]           Discharge Medications   Allergies as of 05/23/2018      Reactions   Naproxen Itching   Nsaids Other (See Comments)   Other reaction(s): Unknown   Sulfa Antibiotics Hives, Rash      Medication List    STOP taking these medications   alendronate 70 MG tablet Commonly known as:  FOSAMAX   amoxicillin-clavulanate 875-125 MG tablet Commonly known as:  AUGMENTIN   levofloxacin 500 MG tablet Commonly known as:  LEVAQUIN     TAKE these medications   ALPRAZolam 0.25 MG tablet Commonly known as:  XANAX Take 1 tablet (0.25 mg total) by mouth 3 (three) times daily as needed for anxiety.   ascorbic acid 500 MG tablet Commonly known as:  VITAMIN C Take 500 mg by mouth daily.   aspirin 81 MG tablet Take 81 mg by mouth at bedtime.   citalopram 20 MG tablet Commonly known as:  CELEXA Take 20 mg by mouth daily.   Co Q-10 100 MG Caps Take 100 mg by mouth daily.   COMBIVENT RESPIMAT 20-100 MCG/ACT Aers respimat Generic drug:  Ipratropium-Albuterol Inhale 1 puff into the lungs every 6 (six) hours as needed for wheezing or shortness of breath.   ipratropium-albuterol 0.5-2.5 (3) MG/3ML Soln Commonly known as:  DUONEB Use 1 vial via nebulizer every 4 t0 6 hrs   doxycycline 100 MG  tablet Commonly known as:  VIBRA-TABS Take 1 tablet (100 mg total) by mouth 2 (two) times daily with a meal for 7 days.   DULERA 200-5 MCG/ACT Aero Generic drug:  mometasone-formoterol Inhale 2 puffs into the lungs 2 (two) times daily.   ferrous sulfate 325 (65 FE) MG tablet Take 325 mg by mouth daily with breakfast.   furosemide 40 MG tablet Commonly known as:  LASIX Take 40 mg by mouth daily.   ibuprofen 800 MG tablet Commonly known as:  ADVIL,MOTRIN Take 800 mg by mouth every 8 (eight) hours as needed for mild pain.   Magnesium 400 MG Tabs Take 400 mg by mouth daily.   montelukast 10 MG tablet Commonly known as:  SINGULAIR TAKE 1 TABLET BY MOUTH EVERY DAY FOR RHINITIS OR ALLERGIES   omeprazole 40 MG capsule Commonly known as:  PRILOSEC Take 40 mg by mouth daily.   Potassium Chloride CR 8 MEQ Cpcr capsule CR Commonly known as:  MICRO-K Take 1 cap po daily may take extra 2-3 times a week What changed:    how much to take  how to take this  when to take this  additional instructions   predniSONE 10 MG (21) Tbpk tablet Commonly known as:  STERAPRED UNI-PAK 21 TAB Start at 60mg  taper by 10mg  until complete   simvastatin 20 MG tablet Commonly known as:  ZOCOR TAKE 1 TABLET BY MOUTH EVERY EVENING FOR CHOLESTEROL   tiotropium 18 MCG inhalation capsule Commonly known as:  SPIRIVA Place 1 capsule (18 mcg total) into inhaler and inhale daily. Start taking on:  05/24/2018 What changed:  when to take this   vitamin B-12 1000 MCG tablet Commonly known as:  CYANOCOBALAMIN Take 1,000 mcg by mouth daily.   VITAMIN D-1000 MAX ST 1000 units tablet Generic drug:  Cholecalciferol Take 1,000 Units by mouth daily.   vitamin E 400 UNIT capsule Take 400 Units by mouth daily.   vitamin k 100 MCG tablet Take 100 mcg by mouth daily.  Total Time in preparing paper work, data evaluation and todays exam - 73 minutes  Dustin Flock M.D on 05/23/2018 at 3:41  PM [facility]  [phone]

## 2018-05-26 LAB — CULTURE, BLOOD (ROUTINE X 2): Culture: NO GROWTH

## 2018-05-30 ENCOUNTER — Other Ambulatory Visit: Payer: Self-pay | Admitting: Internal Medicine

## 2018-06-03 ENCOUNTER — Ambulatory Visit: Payer: BLUE CROSS/BLUE SHIELD | Admitting: Internal Medicine

## 2018-06-03 ENCOUNTER — Encounter: Payer: Self-pay | Admitting: Internal Medicine

## 2018-06-03 ENCOUNTER — Telehealth: Payer: Self-pay

## 2018-06-03 DIAGNOSIS — R0602 Shortness of breath: Secondary | ICD-10-CM

## 2018-06-03 DIAGNOSIS — Z720 Tobacco use: Secondary | ICD-10-CM | POA: Diagnosis not present

## 2018-06-03 DIAGNOSIS — J9611 Chronic respiratory failure with hypoxia: Secondary | ICD-10-CM

## 2018-06-03 DIAGNOSIS — W57XXXS Bitten or stung by nonvenomous insect and other nonvenomous arthropods, sequela: Secondary | ICD-10-CM

## 2018-06-03 MED ORDER — BUPROPION HCL ER (XL) 150 MG PO TB24: 150.0000 mg | ORAL_TABLET | Freq: Every day | ORAL | 6 refills | Status: DC

## 2018-06-03 NOTE — Progress Notes (Signed)
[facility] [place], [place] [address]  Internal MEDICINE  Office Visit Note  Patient Name: Rachael Blankenship  [id number]  [id number]  Date of Service: 06/03/2018     Chief Complaint  Patient presents with  . Hospitalization Follow-up    HPI Pt is here for recent hospital follow up. Pt is here after being discharged after having an acute reaction to tick bite, she was also treated for copd exacerbation. She is still having sob, worse with exertion. She is feeling tired Finished her antibiotics. no fever or chills  Current Medication: Outpatient Encounter Medications as of 06/03/2018  Medication Sig  . ALPRAZolam (XANAX) 0.25 MG tablet Take 1 tablet (0.25 mg total) by mouth 3 (three) times daily as needed for anxiety.  Marland Kitchen ascorbic acid (VITAMIN C) 500 MG tablet Take 500 mg by mouth daily.  Marland Kitchen aspirin 81 MG tablet Take 81 mg by mouth at bedtime.   . Cholecalciferol (VITAMIN D-1000 MAX ST) 1000 units tablet Take 1,000 Units by mouth daily.   . citalopram (CELEXA) 20 MG tablet Take 20 mg by mouth daily.  . Coenzyme Q10 (CO Q-10) 100 MG CAPS Take 100 mg by mouth daily.   . DULERA 200-5 MCG/ACT AERO Inhale 2 puffs into the lungs 2 (two) times daily.   . ferrous sulfate 325 (65 FE) MG tablet Take 325 mg by mouth daily with breakfast.  . furosemide (LASIX) 40 MG tablet Take 40 mg by mouth daily.   Marland Kitchen ibuprofen (ADVIL,MOTRIN) 800 MG tablet Take 800 mg by mouth every 8 (eight) hours as needed for mild pain.   . Ipratropium-Albuterol (COMBIVENT RESPIMAT) 20-100 MCG/ACT AERS respimat Inhale 1 puff into the lungs every 6 (six) hours as needed for wheezing or shortness of breath.   Marland Kitchen ipratropium-albuterol (DUONEB) 0.5-2.5 (3) MG/3ML SOLN Use 1 vial via nebulizer every 4 t0 6 hrs  . Magnesium 400 MG TABS Take 400 mg by mouth daily.   . montelukast (SINGULAIR) 10 MG tablet TAKE 1 TABLET BY MOUTH EVERY DAY FOR RHINITIS OR ALLERGIES  . omeprazole (PRILOSEC) 40 MG capsule Take  40 mg by mouth daily.  . Potassium Chloride CR (MICRO-K) 8 MEQ CPCR capsule CR Take 1 cap po daily may take extra 2-3 times a week (Patient taking differently: Take 8 mEq by mouth daily. Take 1 cap po daily may take extra 2-3 times a week)  . predniSONE (STERAPRED UNI-PAK 21 TAB) 10 MG (21) TBPK tablet Start at 60mg  taper by 10mg  until complete  . simvastatin (ZOCOR) 20 MG tablet TAKE 1 TABLET BY MOUTH EVERY EVENING FOR CHOLESTEROL  . tiotropium (SPIRIVA) 18 MCG inhalation capsule Place 1 capsule (18 mcg total) into inhaler and inhale daily.  . vitamin B-12 (CYANOCOBALAMIN) 1000 MCG tablet Take 1,000 mcg by mouth daily.  . vitamin E 400 UNIT capsule Take 400 Units by mouth daily.  . vitamin k 100 MCG tablet Take 100 mcg by mouth daily.  Marland Kitchen buPROPion (WELLBUTRIN XL) 150 MG 24 hr tablet Take 1 tablet (150 mg total) by mouth daily.   No facility-administered encounter medications on file as of 06/03/2018.     Surgical History: Past Surgical History:  Procedure Laterality Date  . KIDNEY STONE SURGERY    . TONSILLECTOMY      Medical History: Past Medical History:  Diagnosis Date  . COPD (chronic obstructive pulmonary disease) ([place])   . Endometriosis   . GERD (gastroesophageal reflux disease)   . Hyperlipidemia   .  Kidney stones     Family History: Family History  Problem Relation Age of Onset  . Hypertension Mother   . Hyperlipidemia Mother   . GER disease Mother   . Heart attack Father        23 yrs ago  . Hypertension Father   . Parkinson's disease Father   . GER disease Father     Social History   Socioeconomic History  . Marital status: Married    Spouse name: Not on file  . Number of children: Not on file  . Years of education: Not on file  . Highest education level: Not on file  Occupational History  . Not on file  Social Needs  . Financial resource strain: Not on file  . Food insecurity:    Worry: Not on file    Inability: Not on file  . Transportation needs:     Medical: Not on file    Non-medical: Not on file  Tobacco Use  . Smoking status: Current Every Day Smoker    Packs/day: 1.00    Types: Cigarettes    Start date: 11/09/2017  . Smokeless tobacco: Never Used  Substance and Sexual Activity  . Alcohol use: Yes    Comment: socially  . Drug use: No  . Sexual activity: Not on file  Lifestyle  . Physical activity:    Days per week: Not on file    Minutes per session: Not on file  . Stress: Not on file  Relationships  . Social connections:    Talks on phone: Not on file    Gets together: Not on file    Attends religious service: Not on file    Active member of club or organization: Not on file    Attends meetings of clubs or organizations: Not on file    Relationship status: Not on file  . Intimate partner violence:    Fear of current or ex partner: Not on file    Emotionally abused: Not on file    Physically abused: Not on file    Forced sexual activity: Not on file  Other Topics Concern  . Not on file  Social History Narrative  . Not on file   Review of Systems  Constitutional: Negative for chills, diaphoresis and fatigue.  HENT: Positive for postnasal drip. Negative for ear pain and sinus pressure.   Eyes: Negative for photophobia, discharge, redness, itching and visual disturbance.  Respiratory: Positive for shortness of breath. Negative for cough and wheezing.   Cardiovascular: Negative for chest pain, palpitations and leg swelling.  Gastrointestinal: Negative for abdominal pain, constipation, diarrhea, nausea and vomiting.  Genitourinary: Negative for dysuria and flank pain.  Musculoskeletal: Negative for arthralgias, back pain, gait problem and neck pain.  Skin: Negative for color change.  Allergic/Immunologic: Negative for environmental allergies and food allergies.  Neurological: Positive for headaches. Negative for dizziness.  Hematological: Does not bruise/bleed easily.  Psychiatric/Behavioral: Negative for  agitation, behavioral problems (depression) and hallucinations.    Vital Signs: BP 118/66   Pulse 94   Resp 16   Ht 5\' 4"  (1.626 m)   Wt 128 lb 3.2 oz (58.2 kg)   SpO2 91%   BMI 22.01 kg/m    Physical Exam  Constitutional: She is oriented to person, place, and time. She appears well-developed and well-nourished. No distress.  HENT:  Head: Normocephalic and atraumatic.  Mouth/Throat: Oropharynx is clear and moist. No oropharyngeal exudate.  Eyes: Pupils are equal, round, and reactive to  light. EOM are normal.  Neck: Normal range of motion. Neck supple. No JVD present. No tracheal deviation present. No thyromegaly present.  Cardiovascular: Normal rate, regular rhythm and normal heart sounds. Exam reveals no gallop and no friction rub.  No murmur heard. Pulmonary/Chest: Effort normal. No respiratory distress. She has no rales. She exhibits no tenderness.  Reduced air entry bilaterally   Abdominal: Soft. Bowel sounds are normal.  Musculoskeletal: Normal range of motion.  Lymphadenopathy:    She has no cervical adenopathy.  Neurological: She is alert and oriented to person, place, and time. No cranial nerve deficit.  Skin: Skin is warm and dry. She is not diaphoretic.  Psychiatric: She has a normal mood and affect. Her behavior is normal. Judgment and thought content normal.   Asessment/Plan: 1. Chronic hypoxemic respiratory failure (HCC) Became hypoxic just with standing to 87% and walking for few min it ropped further to 83%. Pt was started on o2 on 3 Ln/c, she was able to miantain her o2 sats to 93%  2. Tick bite, sequela finnished antibiotics   3. Shortness of breath - 6 minute walk; Future results are noted be abnormal , pt will need to be on O2 for hypoxia o2 sats of 83% - CT CHEST WO CONTRAST; Future  4. Nicotine abuse Counseling provided and start  buPROPion (WELLBUTRIN XL) 150 MG 24 hr tablet; Take 1 tablet (150 mg total) by mouth daily.  Dispense: 30 tablet; Refill:  6  General Counseling: Joe verbalizes understanding of the findings of todays visit and agrees with plan of treatment. I have discussed any further diagnostic evaluation that may be needed or ordered today. We also reviewed her medications today. she has been encouraged to call the office with any questions or concerns that should arise related to todays visit.  Counseling:  Smoking cessation counseling: 1. Pt acknowledges the risks of long term smoking, she will try to quite smoking. 2. Options for different medications including nicotine products, chewing gum, patch etc, Wellbutrin and Chantix is discussed 3. Goal and date of compete cessation [place] discussed 4. Total time spent in smoking cessation is 10 min.   Orders Placed This Encounter  Procedures  . CT CHEST WO CONTRAST  . 6 minute walk    I have reviewed all medical records from hospital follow up including radiology reports and consults from other physicians. Appropriate follow up diagnostics will be scheduled as needed. Patient/ Family understands the plan of treatment. Time spent 25 minutes.   Dr Lavera Guise, MD Internal Medicine

## 2018-06-03 NOTE — Telephone Encounter (Signed)
OXYGEN ORDER FOR PORTABLE TANK GIVEN TO KELLY WITH 3 LITRE WITH 6 MINUTE WALK

## 2018-06-24 ENCOUNTER — Other Ambulatory Visit: Payer: Self-pay | Admitting: Internal Medicine

## 2018-06-24 ENCOUNTER — Ambulatory Visit: Payer: Self-pay | Admitting: Adult Health

## 2018-06-25 ENCOUNTER — Other Ambulatory Visit: Payer: Self-pay | Admitting: Internal Medicine

## 2018-06-25 MED ORDER — MOMETASONE FURO-FORMOTEROL FUM 200-5 MCG/ACT IN AERO: 1.0000 | INHALATION_SPRAY | Freq: Two times a day (BID) | RESPIRATORY_TRACT | 11 refills | Status: DC

## 2018-06-30 ENCOUNTER — Other Ambulatory Visit: Payer: Self-pay | Admitting: Adult Health

## 2018-06-30 ENCOUNTER — Encounter: Payer: Self-pay | Admitting: Adult Health

## 2018-06-30 ENCOUNTER — Ambulatory Visit: Payer: BLUE CROSS/BLUE SHIELD | Admitting: Adult Health

## 2018-06-30 VITALS — BP 120/70 | HR 86 | Resp 16 | Ht 64.0 in | Wt 128.0 lb

## 2018-06-30 DIAGNOSIS — K921 Melena: Secondary | ICD-10-CM

## 2018-06-30 DIAGNOSIS — R1013 Epigastric pain: Secondary | ICD-10-CM

## 2018-06-30 DIAGNOSIS — Z720 Tobacco use: Secondary | ICD-10-CM | POA: Diagnosis not present

## 2018-06-30 MED ORDER — SUCRALFATE 1 GM/10ML PO SUSP: 1.0000 g | Freq: Three times a day (TID) | ORAL | 0 refills | Status: DC

## 2018-06-30 MED ORDER — SUCRALFATE 1 G PO TABS: 1.0000 g | ORAL_TABLET | Freq: Three times a day (TID) | ORAL | 2 refills | Status: DC

## 2018-06-30 NOTE — Patient Instructions (Signed)
Peptic Ulcer °A peptic ulcer is a painful sore in the lining of your esophagus, stomach, or the first part of your small intestine. You may have pain in the area between your chest and your belly button. The most common causes of an ulcer are: °· An infection. °· Using certain pain medicines too often or too much. ° °Follow these instructions at home: °· Avoid alcohol. °· Avoid caffeine. °· Do not use any tobacco products. These include cigarettes, chewing tobacco, and e-cigarettes. If you need help quitting, ask your doctor. °· Take over-the-counter and prescription medicines only as told by your doctor. Do not stop or change your medicines unless you talk with your doctor about it first. °· Keep all follow-up visits as told by your doctor. This is important. °Contact a doctor if: °· You do not get better in 7 days after you start treatment. °· You keep having an upset stomach (indigestion) or heartburn. °Get help right away if: °· You have sudden, sharp pain in your belly (abdomen). °· You have lasting belly pain. °· You have bloody poop (stool) or black, tarry poop. °· You throw up (vomit) blood. It may look like coffee grounds. °· You feel light-headed or feel like you may pass out (faint). °· You get weak. °· You get sweaty or feel sticky and cold to the touch (clammy). °This information is not intended to replace advice given to you by your health care provider. Make sure you discuss any questions you have with your health care provider. °Document Released: 02/05/2010 Document Revised: 03/27/2016 Document Reviewed: 08/12/2015 °Elsevier Interactive Patient Education © 2018 [employer] ° °

## 2018-06-30 NOTE — Progress Notes (Signed)
[facility] [place], [place] [address]  Internal MEDICINE  Office Visit Note  Patient Name: Rachael Blankenship  [id number]  [id number]  Date of Service: 06/30/2018     Chief Complaint  Patient presents with  . Other    4 wk follow up stint  . Other    stomach ulcer for about a month     HPI Pt is here for recent hospital follow up.  Today she is complaining of epigastric pain that gets better with eating. The pain does not radiate anywhere. She reports black stools over the past 6-8 months.  She has been taking her husbands Sucralfate because she thinks she has an ulcer.  She states it has gotten better since she started the sucralfate and she has not seen anymore black stools.    Current Medication: Outpatient Encounter Medications as of 06/30/2018  Medication Sig  . ALPRAZolam (XANAX) 0.25 MG tablet Take 1 tablet (0.25 mg total) by mouth 3 (three) times daily as needed for anxiety.  Marland Kitchen ascorbic acid (VITAMIN C) 500 MG tablet Take 500 mg by mouth daily.  Marland Kitchen aspirin 81 MG tablet Take 81 mg by mouth at bedtime.   Marland Kitchen buPROPion (WELLBUTRIN XL) 150 MG 24 hr tablet Take 1 tablet (150 mg total) by mouth daily.  . Cholecalciferol (VITAMIN D-1000 MAX ST) 1000 units tablet Take 1,000 Units by mouth daily.   . citalopram (CELEXA) 20 MG tablet Take 20 mg by mouth daily.  . Coenzyme Q10 (CO Q-10) 100 MG CAPS Take 100 mg by mouth daily.   . ferrous sulfate 325 (65 FE) MG tablet Take 325 mg by mouth daily with breakfast.  . furosemide (LASIX) 40 MG tablet Take 40 mg by mouth daily.   Marland Kitchen ibuprofen (ADVIL,MOTRIN) 800 MG tablet Take 800 mg by mouth every 8 (eight) hours as needed for mild pain.   . Ipratropium-Albuterol (COMBIVENT RESPIMAT) 20-100 MCG/ACT AERS respimat Inhale 1 puff into the lungs every 6 (six) hours as needed for wheezing or shortness of breath.   Marland Kitchen ipratropium-albuterol (DUONEB) 0.5-2.5 (3) MG/3ML SOLN Use 1 vial via nebulizer every 4 t0 6 hrs  . Magnesium 400  MG TABS Take 400 mg by mouth daily.   . mometasone-formoterol (DULERA) 200-5 MCG/ACT AERO Inhale 1 puff into the lungs 2 (two) times daily.  . montelukast (SINGULAIR) 10 MG tablet TAKE 1 TABLET BY MOUTH EVERY DAY FOR RHINITIS OR ALLERGIES  . omeprazole (PRILOSEC) 40 MG capsule Take 40 mg by mouth daily.  . OXYGEN Inhale into the lungs.  . Potassium Chloride CR (MICRO-K) 8 MEQ CPCR capsule CR Take 1 cap po daily may take extra 2-3 times a week (Patient taking differently: Take 8 mEq by mouth daily. Take 1 cap po daily may take extra 2-3 times a week)  . predniSONE (STERAPRED UNI-PAK 21 TAB) 10 MG (21) TBPK tablet Start at 60mg  taper by 10mg  until complete  . simvastatin (ZOCOR) 20 MG tablet TAKE 1 TABLET BY MOUTH EVERY EVENING FOR CHOLESTEROL  . tiotropium (SPIRIVA) 18 MCG inhalation capsule Place 1 capsule (18 mcg total) into inhaler and inhale daily.  . vitamin B-12 (CYANOCOBALAMIN) 1000 MCG tablet Take 1,000 mcg by mouth daily.  . vitamin E 400 UNIT capsule Take 400 Units by mouth daily.  . vitamin k 100 MCG tablet Take 100 mcg by mouth daily.   No facility-administered encounter medications on file as of 06/30/2018.     Surgical History: Past Surgical History:  Procedure Laterality Date  . KIDNEY STONE SURGERY    . TONSILLECTOMY      Medical History: Past Medical History:  Diagnosis Date  . COPD (chronic obstructive pulmonary disease) ([place])   . Endometriosis   . GERD (gastroesophageal reflux disease)   . Hyperlipidemia   . Kidney stones     Family History: Family History  Problem Relation Age of Onset  . Hypertension Mother   . Hyperlipidemia Mother   . GER disease Mother   . Heart attack Father        23 yrs ago  . Hypertension Father   . Parkinson's disease Father   . GER disease Father     Social History   Socioeconomic History  . Marital status: Married    Spouse name: Not on file  . Number of children: Not on file  . Years of education: Not on file  .  Highest education level: Not on file  Occupational History  . Not on file  Social Needs  . Financial resource strain: Not on file  . Food insecurity:    Worry: Not on file    Inability: Not on file  . Transportation needs:    Medical: Not on file    Non-medical: Not on file  Tobacco Use  . Smoking status: Current Every Day Smoker    Packs/day: 1.00    Types: Cigarettes    Start date: 11/09/2017  . Smokeless tobacco: Never Used  . Tobacco comment: pt cut back  smoking 1-2 daily  Substance and Sexual Activity  . Alcohol use: Yes    Comment: socially  . Drug use: No  . Sexual activity: Not on file  Lifestyle  . Physical activity:    Days per week: Not on file    Minutes per session: Not on file  . Stress: Not on file  Relationships  . Social connections:    Talks on phone: Not on file    Gets together: Not on file    Attends religious service: Not on file    Active member of club or organization: Not on file    Attends meetings of clubs or organizations: Not on file    Relationship status: Not on file  . Intimate partner violence:    Fear of current or ex partner: Not on file    Emotionally abused: Not on file    Physically abused: Not on file    Forced sexual activity: Not on file  Other Topics Concern  . Not on file  Social History Narrative  . Not on file      Review of Systems  Constitutional: Negative for chills, fatigue and unexpected weight change.  HENT: Negative for congestion, rhinorrhea, sneezing and sore throat.   Eyes: Negative for photophobia, pain and redness.  Respiratory: Negative for cough, chest tightness and shortness of breath.   Cardiovascular: Negative for chest pain and palpitations.  Gastrointestinal: Negative for abdominal pain, constipation, diarrhea, nausea and vomiting.  Endocrine: Negative.   Genitourinary: Negative for dysuria and frequency.  Musculoskeletal: Negative for arthralgias, back pain, joint swelling and neck pain.   Skin: Negative for rash.  Allergic/Immunologic: Negative.   Neurological: Negative for tremors and numbness.  Hematological: Negative for adenopathy. Does not bruise/bleed easily.  Psychiatric/Behavioral: Negative for behavioral problems and sleep disturbance. The patient is not nervous/anxious.     Vital Signs: BP 120/70 (BP Location: Left Arm, Patient Position: Sitting, Cuff Size: Normal)   Pulse 86   Resp 16  Ht 5\' 4"  (1.626 m)   Wt 128 lb (58.1 kg)   SpO2 93%   BMI 21.97 kg/m    Physical Exam  Constitutional: She is oriented to person, place, and time. She appears well-developed and well-nourished. No distress.  HENT:  Head: Normocephalic and atraumatic.  Mouth/Throat: Oropharynx is clear and moist. No oropharyngeal exudate.  Eyes: Pupils are equal, round, and reactive to light. EOM are normal.  Neck: Normal range of motion. Neck supple. No JVD present. No tracheal deviation present. No thyromegaly present.  Cardiovascular: Normal rate, regular rhythm and normal heart sounds. Exam reveals no gallop and no friction rub.  No murmur heard. Pulmonary/Chest: Effort normal and breath sounds normal. No respiratory distress. She has no wheezes. She has no rales. She exhibits no tenderness.  Abdominal: Soft. There is no tenderness. There is no guarding.  Musculoskeletal: Normal range of motion.  Lymphadenopathy:    She has no cervical adenopathy.  Neurological: She is alert and oriented to person, place, and time. No cranial nerve deficit.  Skin: Skin is warm and dry. She is not diaphoretic.  Psychiatric: She has a normal mood and affect. Her behavior is normal. Judgment and thought content normal.  Nursing note and vitals reviewed.  Assessment/Plan: 1. Epigastric pain Follow up with GI.  Continue Carafate and PPI as discussed.  - sucralfate (CARAFATE) 1 GM/10ML suspension; Take 10 mLs (1 g total) by mouth 4 (four) times daily -  with meals and at bedtime.  Dispense: 420 mL;  Refill: 0 - US Abdomen Complete; Future - Ambulatory referral to Gastroenterology  2. Black stool - sucralfate (CARAFATE) 1 GM/10ML suspension; Take 10 mLs (1 g total) by mouth 4 (four) times daily -  with meals and at bedtime.  Dispense: 420 mL; Refill: 0 - Ambulatory referral to Gastroenterology  3. Nicotine abuse Smoking cessation counseling: 1. Pt acknowledges the risks of long term smoking, she will try to quite smoking. 2. Options for different medications including nicotine products, chewing gum, patch etc, Wellbutrin and Chantix is discussed 3. Goal and date of compete cessation is discussed 4. Total time spent in smoking cessation is 15 min.   General Counseling: ladaja yusupov understanding of the findings of todays visit and agrees with plan of treatment. I have discussed any further diagnostic evaluation that may be needed or ordered today. We also reviewed her medications today. she has been encouraged to call the office with any questions or concerns that should arise related to todays visit.    Counseling:   No orders of the defined types were placed in this encounter.    This patient was seen by Orson Gear AGNP-C in Collaboration with Dr Lavera Guise as a part of collaborative care agreement  I have reviewed all medical records from hospital follow up including radiology reports and consults from other physicians. Appropriate follow up diagnostics will be scheduled as needed. Patient/ Family understands the plan of treatment. Time spent 35 minutes.   Dr Lavera Guise, MD Internal Medicine

## 2018-07-02 ENCOUNTER — Encounter: Payer: Self-pay | Admitting: Adult Health

## 2018-07-03 ENCOUNTER — Ambulatory Visit (INDEPENDENT_AMBULATORY_CARE_PROVIDER_SITE_OTHER): Payer: BLUE CROSS/BLUE SHIELD

## 2018-07-03 DIAGNOSIS — R1013 Epigastric pain: Secondary | ICD-10-CM

## 2018-07-09 ENCOUNTER — Telehealth: Payer: Self-pay

## 2018-07-09 NOTE — Telephone Encounter (Signed)
-----   Message from Rachael Bane, NP sent at 07/09/2018  1:48 PM EDT ----- Regarding: [place] Her [place] was normal.  No abnormalities noted.  Please inform her. She should have an appt to see GI and she should still follow up with them.  ty

## 2018-07-09 NOTE — Telephone Encounter (Signed)
Pt advised u/s is normal and keep appt with GI

## 2018-07-09 NOTE — Telephone Encounter (Signed)
Tried calling pt to give her lab results but there was no answer, voicemail is not setup so I couldn't leave a message.

## 2018-07-28 ENCOUNTER — Encounter: Payer: Self-pay | Admitting: *Deleted

## 2018-07-29 ENCOUNTER — Ambulatory Visit: Payer: BLUE CROSS/BLUE SHIELD | Admitting: Anesthesiology

## 2018-07-29 ENCOUNTER — Encounter: Payer: Self-pay | Admitting: Internal Medicine

## 2018-07-29 ENCOUNTER — Other Ambulatory Visit: Payer: Self-pay

## 2018-07-29 ENCOUNTER — Encounter
Admission: RE | Disposition: A | Discharge status: Home / Self Care | Payer: Self-pay | Source: Ambulatory Visit | Attending: Internal Medicine

## 2018-07-29 ENCOUNTER — Ambulatory Visit
Admission: RE | Admit: 2018-07-29 | Discharge: 2018-07-29 | Disposition: A | Discharge status: Home / Self Care | Payer: BLUE CROSS/BLUE SHIELD | Source: Ambulatory Visit | Attending: Internal Medicine | Admitting: Internal Medicine

## 2018-07-29 ENCOUNTER — Other Ambulatory Visit: Payer: Self-pay | Admitting: Internal Medicine

## 2018-07-29 DIAGNOSIS — E785 Hyperlipidemia, unspecified: Secondary | ICD-10-CM | POA: Diagnosis not present

## 2018-07-29 DIAGNOSIS — Z882 Allergy status to sulfonamides status: Secondary | ICD-10-CM | POA: Diagnosis not present

## 2018-07-29 DIAGNOSIS — Z7982 Long term (current) use of aspirin: Secondary | ICD-10-CM | POA: Diagnosis not present

## 2018-07-29 DIAGNOSIS — Z79899 Other long term (current) drug therapy: Secondary | ICD-10-CM | POA: Insufficient documentation

## 2018-07-29 DIAGNOSIS — Z888 Allergy status to other drugs, medicaments and biological substances status: Secondary | ICD-10-CM | POA: Insufficient documentation

## 2018-07-29 DIAGNOSIS — K2950 Unspecified chronic gastritis without bleeding: Secondary | ICD-10-CM | POA: Diagnosis not present

## 2018-07-29 DIAGNOSIS — Z9981 Dependence on supplemental oxygen: Secondary | ICD-10-CM | POA: Diagnosis not present

## 2018-07-29 DIAGNOSIS — R062 Wheezing: Secondary | ICD-10-CM | POA: Insufficient documentation

## 2018-07-29 DIAGNOSIS — R1013 Epigastric pain: Secondary | ICD-10-CM | POA: Diagnosis present

## 2018-07-29 DIAGNOSIS — K219 Gastro-esophageal reflux disease without esophagitis: Secondary | ICD-10-CM | POA: Diagnosis not present

## 2018-07-29 DIAGNOSIS — Z87442 Personal history of urinary calculi: Secondary | ICD-10-CM | POA: Diagnosis not present

## 2018-07-29 DIAGNOSIS — J449 Chronic obstructive pulmonary disease, unspecified: Secondary | ICD-10-CM | POA: Insufficient documentation

## 2018-07-29 HISTORY — PX: ESOPHAGOGASTRODUODENOSCOPY: SHX5428

## 2018-07-29 SURGERY — EGD (ESOPHAGOGASTRODUODENOSCOPY)
Anesthesia: General

## 2018-07-29 MED ORDER — LIDOCAINE HCL (CARDIAC) PF 100 MG/5ML IV SOSY
PREFILLED_SYRINGE | INTRAVENOUS | Status: DC | PRN
  Administered 2018-07-29: 25 mg via INTRAVENOUS

## 2018-07-29 MED ORDER — SODIUM CHLORIDE 0.9 % IV SOLN
INTRAVENOUS | Status: DC
  Administered 2018-07-29: 10:00:00 via INTRAVENOUS

## 2018-07-29 MED ORDER — LIDOCAINE HCL (PF) 2 % IJ SOLN
INTRAMUSCULAR | Status: AC
  Filled 2018-07-29: qty 10

## 2018-07-29 MED ORDER — PROPOFOL 500 MG/50ML IV EMUL
INTRAVENOUS | Status: DC | PRN
  Administered 2018-07-29: 150 ug/kg/min via INTRAVENOUS

## 2018-07-29 MED ORDER — PROPOFOL 10 MG/ML IV BOLUS
INTRAVENOUS | Status: DC | PRN
  Administered 2018-07-29: 25 mg via INTRAVENOUS
  Administered 2018-07-29: 75 mg via INTRAVENOUS

## 2018-07-29 MED ORDER — SIMVASTATIN 20 MG PO TABS: 20.0000 mg | ORAL_TABLET | Freq: Every evening | ORAL | 0 refills | Status: DC

## 2018-07-29 MED ORDER — PROPOFOL 500 MG/50ML IV EMUL
INTRAVENOUS | Status: AC
  Filled 2018-07-29: qty 50

## 2018-07-29 NOTE — Anesthesia Preprocedure Evaluation (Signed)
Anesthesia Evaluation  Patient identified by MRN, date of birth, ID band Patient awake    Reviewed: Allergy & Precautions, H&P , NPO status , Patient's Chart, lab work & pertinent test results  Airway Mallampati: II  TM Distance: >3 FB Neck ROM: full    Dental   Pulmonary neg pulmonary ROS, COPD, Current Smoker,     + wheezing (slight wheeze)      Cardiovascular negative cardio ROS   Rhythm:regular Rate:Normal     Neuro/Psych negative neurological ROS  negative psych ROS   GI/Hepatic negative GI ROS, Neg liver ROS, GERD  ,  Endo/Other  negative endocrine ROS  Renal/GU Renal disease (renal stones)negative Renal ROS  negative genitourinary   Musculoskeletal   Abdominal   Peds  Hematology negative hematology ROS (+)   Anesthesia Other Findings Past Medical History: No date: COPD (chronic obstructive pulmonary disease) ([facility]) No date: Endometriosis No date: GERD (gastroesophageal reflux disease) No date: Hyperlipidemia No date: Kidney stones  Past Surgical History: No date: KIDNEY STONE SURGERY No date: TONSILLECTOMY  BMI    Body Mass Index:  21.80 kg/m      Reproductive/Obstetrics negative OB ROS                             Anesthesia Physical Anesthesia Plan  ASA: III  Anesthesia Plan: General   Post-op Pain Management:    Induction:   PONV Risk Score and Plan: Propofol infusion and TIVA  Airway Management Planned: Natural Airway and Nasal Cannula  Additional Equipment:   Intra-op Plan:   Post-operative Plan:   Informed Consent: I have reviewed the patients History and Physical, chart, labs and discussed the procedure including the risks, benefits and alternatives for the proposed anesthesia with the patient or authorized representative who has indicated his/her understanding and acceptance.   Dental Advisory Given  Plan Discussed with: Anesthesiologist, CRNA and  Surgeon  Anesthesia Plan Comments:         Anesthesia Quick Evaluation

## 2018-07-29 NOTE — Interval H&P Note (Signed)
History and Physical Interval Note:  07/29/2018 10:44 AM  Rachael Blankenship  has presented today for surgery, with the diagnosis of EPIGASTRIC PAIN,MELENA  The various methods of treatment have been discussed with the patient and family. After consideration of risks, benefits and other options for treatment, the patient has consented to  Procedure(s): ESOPHAGOGASTRODUODENOSCOPY (EGD) (N/A) as a surgical intervention .  The patient's history has been reviewed, patient examined, no change in status, stable for surgery.  I have reviewed the patient's chart and labs.  Questions were answered to the patient's satisfaction.     [place], [place]

## 2018-07-29 NOTE — Op Note (Signed)
[facility] Patient Name: Rachael Blankenship Procedure Date: 07/29/2018 10:39 AM MRN: [id number] Account #: [id number] Date of Birth: [birth year] Admit Type: Outpatient Age: 62 Room: [facility] ENDO ROOM 1 Gender: Female Note Status: Finalized Procedure:            Upper GI endoscopy Indications:          Epigastric abdominal pain, Melena Providers:            Benay Pike. Alice Reichert MD, MD Referring MD:         Lavera Guise, MD (Referring MD) Medicines:            Propofol per Anesthesia Complications:        No immediate complications. Procedure:            Pre-Anesthesia Assessment:                       - The risks and benefits of the procedure and the                        sedation options and risks were discussed with the                        patient. All questions were answered and informed                        consent was obtained.                       - Patient identification and proposed procedure were                        verified prior to the procedure by the nurse. The                        procedure was verified in the procedure room.                       - ASA Grade Assessment: III - A patient with severe                        systemic disease.                       - After reviewing the risks and benefits, the patient                        was deemed in satisfactory condition to undergo the                        procedure.                       After obtaining informed consent, the endoscope was                        passed under direct vision. Throughout the procedure,                        the patient's blood pressure, pulse, and oxygen  saturations were monitored continuously. The Endoscope                        was introduced through the mouth, and advanced to the                        third part of duodenum. The upper GI endoscopy was                        accomplished without difficulty. The patient  tolerated                        the procedure well. Findings:      The examined esophagus was normal.      A 2 cm hiatal hernia was present.      Segmental mild inflammation characterized by erythema was found in the       gastric antrum. Biopsies were taken with a cold forceps for Helicobacter       pylori testing.      The examined duodenum was normal.      The exam was otherwise without abnormality. Impression:           - Normal esophagus.                       - 2 cm hiatal hernia.                       - Gastritis. Biopsied.                       - Normal examined duodenum.                       - The examination was otherwise normal. Recommendation:       - Await pathology results.                       - Patient has a contact number available for                        emergencies. The signs and symptoms of potential                        delayed complications were discussed with the patient.                        Return to normal activities tomorrow. Written discharge                        instructions were provided to the patient.                       - Resume previous diet.                       - Continue present medications.                       - Return to physician assistant in 3 months.                       - The findings and recommendations were  discussed with                        the patient and their spouse. Procedure Code(s):    --- Professional ---                       [id number], Esophagogastroduodenoscopy, flexible, transoral;                        with biopsy, single or multiple Diagnosis Code(s):    --- Professional ---                       K92.1, Melena (includes Hematochezia)                       R10.13, Epigastric pain                       K29.70, Gastritis, unspecified, without bleeding                       K44.9, Diaphragmatic hernia without obstruction or                        gangrene CPT copyright 2017 American Medical Association. All  rights reserved. The codes documented in this report are preliminary and upon coder review may  be revised to meet current compliance requirements. Efrain Sella MD, MD 07/29/2018 11:07:33 AM This report has been signed electronically. Number of Addenda: 0 Note Initiated On: 07/29/2018 10:39 AM      [facility]

## 2018-07-29 NOTE — Anesthesia Post-op Follow-up Note (Signed)
Anesthesia QCDR form completed.        

## 2018-07-29 NOTE — H&P (Signed)
Outpatient short stay form Pre-procedure 07/29/2018 10:38 AM Rachael Blankenship [name], M.D.  Primary Physician: Clayborn Bigness, M.D.  Reason for visit:  Epigastric pain, melena  History of present illness:  62 y/o female presents with c/o intermitten "dark stools" and epigastric pain.  Patient took Carafate 1 g 3 times a day and has since stopped having melenic stool.  Patient's last colonoscopy was circa 5 years ago and was unremarkable per patient history.     Current Facility-Administered Medications:  .  0.9 %  sodium chloride infusion, , Intravenous, Continuous, [place], Benay Pike, MD, Last Rate: 20 mL/hr at 07/29/18 1017  Medications Prior to Admission  Medication Sig Dispense Refill Last Dose  . ALPRAZolam (XANAX) 0.25 MG tablet Take 1 tablet (0.25 mg total) by mouth 3 (three) times daily as needed for anxiety. 90 tablet 1 Past Week at Unknown time  . aspirin 81 MG tablet Take 81 mg by mouth at bedtime.    Past Week at Unknown time  . furosemide (LASIX) 40 MG tablet Take 40 mg by mouth daily.    07/28/2018 at Unknown time  . levofloxacin (LEVAQUIN) 500 MG tablet Take 500 mg by mouth daily.     . mometasone (NASONEX) 50 MCG/ACT nasal spray Place 2 sprays into the nose daily.     . mometasone-formoterol (DULERA) 200-5 MCG/ACT AERO Inhale 1 puff into the lungs 2 (two) times daily. 1 Inhaler 11 07/29/2018 at Unknown time  . omeprazole (PRILOSEC) 40 MG capsule Take 40 mg by mouth daily.   07/28/2018 at Unknown time  . phentermine (ADIPEX-P) 37.5 MG tablet Take 37.5 mg by mouth daily before breakfast.     . sucralfate (CARAFATE) 1 g tablet Take 1 tablet (1 g total) by mouth 4 (four) times daily -  with meals and at bedtime. 120 tablet 2 07/28/2018 at Unknown time  . vitamin E 400 UNIT capsule Take 400 Units by mouth daily.   07/28/2018 at Unknown time  . alendronate (FOSAMAX) 70 MG tablet Take 70 mg by mouth once a week. Take with a full glass of water on an empty stomach.   Not Taking at Unknown time  .  ascorbic acid (VITAMIN C) 500 MG tablet Take 500 mg by mouth daily.   Taking  . buPROPion (WELLBUTRIN XL) 150 MG 24 hr tablet Take 1 tablet (150 mg total) by mouth daily. 30 tablet 6 Taking  . Cholecalciferol (VITAMIN D-1000 MAX ST) 1000 units tablet Take 1,000 Units by mouth daily.    Taking  . citalopram (CELEXA) 20 MG tablet Take 20 mg by mouth daily.   Taking  . Coenzyme Q10 (CO Q-10) 100 MG CAPS Take 200 mg by mouth daily.    Taking  . ferrous sulfate 325 (65 FE) MG tablet Take 325 mg by mouth daily with breakfast.   Not Taking at Unknown time  . ibuprofen (ADVIL,MOTRIN) 800 MG tablet Take 800 mg by mouth every 8 (eight) hours as needed for mild pain.    Taking  . Ipratropium-Albuterol (COMBIVENT RESPIMAT) 20-100 MCG/ACT AERS respimat Inhale 1 puff into the lungs every 6 (six) hours as needed for wheezing or shortness of breath.    Taking  . ipratropium-albuterol (DUONEB) 0.5-2.5 (3) MG/3ML SOLN Use 1 vial via nebulizer every 4 t0 6 hrs (Patient not taking: Reported on 07/29/2018) 360 mL 3 Completed Course at Unknown time  . Magnesium 400 MG TABS Take 400 mg by mouth daily.    Taking  . montelukast (SINGULAIR) 10  MG tablet TAKE 1 TABLET BY MOUTH EVERY DAY FOR RHINITIS OR ALLERGIES 30 tablet 0   . OXYGEN Inhale into the lungs.   Taking  . Potassium Chloride CR (MICRO-K) 8 MEQ CPCR capsule CR Take 1 cap po daily may take extra 2-3 times a week (Patient taking differently: Take 8 mEq by mouth daily. Take 1 cap po daily may take extra 2-3 times a week) 45 capsule 3 Taking  . predniSONE (STERAPRED UNI-PAK 21 TAB) 10 MG (21) TBPK tablet Start at 60mg  taper by 10mg  until complete 21 tablet 0 Taking  . tiotropium (SPIRIVA) 18 MCG inhalation capsule Place 1 capsule (18 mcg total) into inhaler and inhale daily. 30 capsule 12 Taking  . vitamin B-12 (CYANOCOBALAMIN) 1000 MCG tablet Take 1,000 mcg by mouth daily.   Taking  . vitamin k 100 MCG tablet Take 100 mcg by mouth daily.   Not Taking at Unknown time      Allergies  Allergen Reactions  . Naproxen Itching  . Nsaids Other (See Comments)    Other reaction(s): Unknown  . Sulfa Antibiotics Hives and Rash     Past Medical History:  Diagnosis Date  . COPD (chronic obstructive pulmonary disease) ([place])   . Endometriosis   . GERD (gastroesophageal reflux disease)   . Hyperlipidemia   . Kidney stones     Review of systems:  Otherwise negative.    Physical Exam  Gen: Alert, oriented. Appears stated age.  HEENT: [place]/AT. PERRLA. Lungs: CTA, no wheezes. CV: RR nl S1, S2. Abd: soft, benign, no masses. BS+ Ext: No edema. Pulses 2+    Planned procedures: Proceed with EGD. The patient understands the nature of the planned procedure, indications, risks, alternatives and potential complications including but not limited to bleeding, infection, perforation, damage to internal organs and possible oversedation/side effects from anesthesia. The patient agrees and gives consent to proceed.  Please refer to procedure notes for findings, recommendations and patient disposition/instructions.     Rachael Blankenship [name], M.D. Gastroenterology 07/29/2018  10:38 AM

## 2018-07-29 NOTE — Transfer of Care (Signed)
Immediate Anesthesia Transfer of Care Note  Patient: Rachael Blankenship  Procedure(s) Performed: ESOPHAGOGASTRODUODENOSCOPY (EGD) (N/A )  Patient Location: PACU and Endoscopy Unit  Anesthesia Type:General  Level of Consciousness: awake  Airway & Oxygen Therapy: Patient Spontanous Breathing  Post-op Assessment: Report given to RN  Post vital signs: stable  Last Vitals:  Vitals Value Taken Time  BP 103/67 07/29/2018 11:09 AM  Temp    Pulse 69 07/29/2018 11:10 AM  Resp 17 07/29/2018 11:10 AM  SpO2 97 % 07/29/2018 11:10 AM  Vitals shown include unvalidated device data.  Last Pain: There were no vitals filed for this visit.       Complications: No apparent anesthesia complications

## 2018-07-30 ENCOUNTER — Encounter: Payer: Self-pay | Admitting: Internal Medicine

## 2018-07-30 NOTE — Anesthesia Postprocedure Evaluation (Signed)
Anesthesia Post Note  Patient: Rachael Blankenship  Procedure(s) Performed: ESOPHAGOGASTRODUODENOSCOPY (EGD) (N/A )  Patient location during evaluation: PACU Anesthesia Type: General Level of consciousness: awake and alert Pain management: pain level controlled Vital Signs Assessment: post-procedure vital signs reviewed and stable Respiratory status: spontaneous breathing, nonlabored ventilation and respiratory function stable Cardiovascular status: blood pressure returned to baseline and stable Postop Assessment: no apparent nausea or vomiting Anesthetic complications: no     Last Vitals:  Vitals:   07/29/18 1134 07/29/18 1144  BP:  128/70  Pulse: (!) 57 (!) 54  Resp: 18 17  Temp:    SpO2: 96% 98%    Last Pain:  Vitals:   07/29/18 1144  TempSrc:   PainSc: 0-No pain                 Durenda Hurt

## 2018-07-30 NOTE — Telephone Encounter (Signed)
DONE

## 2018-07-31 LAB — SURGICAL PATHOLOGY

## 2018-08-04 ENCOUNTER — Other Ambulatory Visit: Payer: Self-pay | Admitting: Internal Medicine

## 2018-08-04 DIAGNOSIS — R1013 Epigastric pain: Secondary | ICD-10-CM

## 2018-08-04 DIAGNOSIS — R1011 Right upper quadrant pain: Secondary | ICD-10-CM

## 2018-08-07 ENCOUNTER — Other Ambulatory Visit: Payer: Self-pay

## 2018-08-07 MED ORDER — FUROSEMIDE 40 MG PO TABS: 40.0000 mg | ORAL_TABLET | Freq: Every day | ORAL | 5 refills | Status: DC

## 2018-08-13 ENCOUNTER — Other Ambulatory Visit: Payer: Self-pay

## 2018-08-13 NOTE — Telephone Encounter (Signed)
Pt needed refill on montelukast but this was filled on 07/29/18 and pt was called to be informed. I let her know if the pharmacy does not have it then give [place] a call back.

## 2018-08-19 ENCOUNTER — Encounter
Admission: RE | Admit: 2018-08-19 | Discharge: 2018-08-19 | Disposition: A | Discharge status: Home / Self Care | Payer: BLUE CROSS/BLUE SHIELD | Source: Ambulatory Visit | Attending: Internal Medicine | Admitting: Internal Medicine

## 2018-08-19 DIAGNOSIS — R1011 Right upper quadrant pain: Secondary | ICD-10-CM | POA: Diagnosis present

## 2018-08-19 DIAGNOSIS — R1013 Epigastric pain: Secondary | ICD-10-CM | POA: Insufficient documentation

## 2018-08-19 MED ORDER — TECHNETIUM TC 99M MEBROFENIN IV KIT
5.0000 | PACK | Freq: Once | INTRAVENOUS | Status: AC | PRN
  Administered 2018-08-19: 5.693 via INTRAVENOUS

## 2018-08-21 ENCOUNTER — Encounter: Payer: Self-pay | Admitting: Internal Medicine

## 2018-08-21 NOTE — Progress Notes (Signed)
SCANNED IN 6 MIN WALK DONE ON 06/03/18

## 2018-08-24 ENCOUNTER — Encounter: Payer: Self-pay | Admitting: Nurse Practitioner

## 2018-08-24 ENCOUNTER — Ambulatory Visit: Payer: BLUE CROSS/BLUE SHIELD | Admitting: Nurse Practitioner

## 2018-08-24 ENCOUNTER — Ambulatory Visit: Payer: Self-pay | Admitting: Nurse Practitioner

## 2018-08-24 VITALS — BP 102/66 | HR 78 | Resp 16 | Ht 64.0 in | Wt 130.0 lb

## 2018-08-24 DIAGNOSIS — K921 Melena: Secondary | ICD-10-CM | POA: Diagnosis not present

## 2018-08-24 DIAGNOSIS — J449 Chronic obstructive pulmonary disease, unspecified: Secondary | ICD-10-CM

## 2018-08-24 DIAGNOSIS — K219 Gastro-esophageal reflux disease without esophagitis: Secondary | ICD-10-CM | POA: Diagnosis not present

## 2018-08-24 DIAGNOSIS — Z23 Encounter for immunization: Secondary | ICD-10-CM

## 2018-08-24 MED ORDER — OMEPRAZOLE 40 MG PO CPDR: 40.0000 mg | DELAYED_RELEASE_CAPSULE | Freq: Two times a day (BID) | ORAL | 3 refills | Status: DC

## 2018-08-24 NOTE — Progress Notes (Signed)
[facility] [place], [place] [address]  Internal MEDICINE  Office Visit Note  Patient Name: Rachael Blankenship  [id number]  [id number]  Date of Service: 08/25/2018  Chief Complaint  Patient presents with  . Abdominal Pain    follow up , better now   . Quality Metric Gaps    cologuard    The patient was seen for epigastric pain and dark stools at her last visit. Ultrasound of her abdomen was performed and results were normal. She was referred to GI. She has had upper endoscopy. This did show some gastritis and 2cm hiatal hernia. Her dose of omeprazole was doubled to 40mg  twice daily and carafate was also added. They also did HIDA scan and the results of this were within normal limits. She states that she is feeling much better. There burning in her upper abdomen is gone and she is no longer having black stools. She knows she needs to have colon cancer screening, but would like to do cologuard first. She has no known family history of colon cancer.       Current Medication: Outpatient Encounter Medications as of 08/24/2018  Medication Sig  . alendronate (FOSAMAX) 70 MG tablet Take 70 mg by mouth once a week. Take with a full glass of water on an empty stomach.  . ALPRAZolam (XANAX) 0.25 MG tablet Take 1 tablet (0.25 mg total) by mouth 3 (three) times daily as needed for anxiety.  Marland Kitchen ascorbic acid (VITAMIN C) 500 MG tablet Take 500 mg by mouth daily.  Marland Kitchen aspirin 81 MG tablet Take 81 mg by mouth at bedtime.   Marland Kitchen buPROPion (WELLBUTRIN XL) 150 MG 24 hr tablet Take 1 tablet (150 mg total) by mouth daily.  . Cholecalciferol (VITAMIN D-1000 MAX ST) 1000 units tablet Take 1,000 Units by mouth daily.   . citalopram (CELEXA) 20 MG tablet Take 20 mg by mouth daily.  . Coenzyme Q10 (CO Q-10) 100 MG CAPS Take 200 mg by mouth daily.   . furosemide (LASIX) 40 MG tablet Take 1 tablet (40 mg total) by mouth daily.  Marland Kitchen ibuprofen (ADVIL,MOTRIN) 800 MG tablet Take 800 mg by mouth every  8 (eight) hours as needed for mild pain.   . Ipratropium-Albuterol (COMBIVENT RESPIMAT) 20-100 MCG/ACT AERS respimat Inhale 1 puff into the lungs every 6 (six) hours as needed for wheezing or shortness of breath.   Marland Kitchen ipratropium-albuterol (DUONEB) 0.5-2.5 (3) MG/3ML SOLN Use 1 vial via nebulizer every 4 t0 6 hrs  . Magnesium 400 MG TABS Take 400 mg by mouth daily.   . mometasone (NASONEX) 50 MCG/ACT nasal spray Place 2 sprays into the nose daily.  . mometasone-formoterol (DULERA) 200-5 MCG/ACT AERO Inhale 1 puff into the lungs 2 (two) times daily.  . montelukast (SINGULAIR) 10 MG tablet TAKE 1 TABLET BY MOUTH EVERY DAY FOR RHINITIS OR ALLERGIES  . omeprazole (PRILOSEC) 40 MG capsule Take 1 capsule (40 mg total) by mouth 2 (two) times daily.  . OXYGEN Inhale into the lungs. Oxygen at night, 2 liters  . Potassium Chloride CR (MICRO-K) 8 MEQ CPCR capsule CR Take 1 cap po daily may take extra 2-3 times a week (Patient taking differently: Take 8 mEq by mouth daily. Take 1 cap po daily may take extra 2-3 times a week)  . simvastatin (ZOCOR) 20 MG tablet Take 1 tablet (20 mg total) by mouth every evening. for cholesterol  . sucralfate (CARAFATE) 1 g tablet Take 1 tablet (1 g total)  by mouth 4 (four) times daily -  with meals and at bedtime.  Marland Kitchen tiotropium (SPIRIVA) 18 MCG inhalation capsule Place 1 capsule (18 mcg total) into inhaler and inhale daily.  . vitamin B-12 (CYANOCOBALAMIN) 1000 MCG tablet Take 1,000 mcg by mouth daily.  . [DISCONTINUED] omeprazole (PRILOSEC) 40 MG capsule Take 40 mg by mouth 2 (two) times daily.   . [DISCONTINUED] ferrous sulfate 325 (65 FE) MG tablet Take 325 mg by mouth daily with breakfast.  . [DISCONTINUED] levofloxacin (LEVAQUIN) 500 MG tablet Take 500 mg by mouth daily.  . [DISCONTINUED] phentermine (ADIPEX-P) 37.5 MG tablet Take 37.5 mg by mouth daily before breakfast.  . [DISCONTINUED] predniSONE (STERAPRED UNI-PAK 21 TAB) 10 MG (21) TBPK tablet Start at 60mg  taper by  10mg  until complete (Patient not taking: Reported on 08/24/2018)  . [DISCONTINUED] vitamin E 400 UNIT capsule Take 400 Units by mouth daily.  . [DISCONTINUED] vitamin k 100 MCG tablet Take 100 mcg by mouth daily.   No facility-administered encounter medications on file as of 08/24/2018.     Surgical History: Past Surgical History:  Procedure Laterality Date  . ESOPHAGOGASTRODUODENOSCOPY N/A 07/29/2018   Procedure: ESOPHAGOGASTRODUODENOSCOPY (EGD);  Surgeon: Toledo, Benay Pike, MD;  Location: [facility] ENDOSCOPY;  Service: Gastroenterology;  Laterality: N/A;  . KIDNEY STONE SURGERY    . TONSILLECTOMY      Medical History: Past Medical History:  Diagnosis Date  . COPD (chronic obstructive pulmonary disease) ([place])   . Endometriosis   . GERD (gastroesophageal reflux disease)   . Hyperlipidemia   . Kidney stones     Family History: Family History  Problem Relation Age of Onset  . Hypertension Mother   . Hyperlipidemia Mother   . GER disease Mother   . Heart attack Father        23 yrs ago  . Hypertension Father   . Parkinson's disease Father   . GER disease Father     Social History   Socioeconomic History  . Marital status: Married    Spouse name: Not on file  . Number of children: Not on file  . Years of education: Not on file  . Highest education level: Not on file  Occupational History  . Not on file  Social Needs  . Financial resource strain: Not on file  . Food insecurity:    Worry: Not on file    Inability: Not on file  . Transportation needs:    Medical: Not on file    Non-medical: Not on file  Tobacco Use  . Smoking status: Current Every Day Smoker    Packs/day: 1.00    Types: Cigarettes    Start date: 11/09/2017  . Smokeless tobacco: Never Used  . Tobacco comment: pt cut back  smoking 1-2 daily  Substance and Sexual Activity  . Alcohol use: Yes    Comment: socially  . Drug use: No  . Sexual activity: Not on file  Lifestyle  . Physical activity:     Days per week: Not on file    Minutes per session: Not on file  . Stress: Not on file  Relationships  . Social connections:    Talks on phone: Not on file    Gets together: Not on file    Attends religious service: Not on file    Active member of club or organization: Not on file    Attends meetings of clubs or organizations: Not on file    Relationship status: Not on file  .  Intimate partner violence:    Fear of current or ex partner: Not on file    Emotionally abused: Not on file    Physically abused: Not on file    Forced sexual activity: Not on file  Other Topics Concern  . Not on file  Social History Narrative  . Not on file      Review of Systems  Constitutional: Negative for activity change, appetite change, chills, fatigue and unexpected weight change.  HENT: Negative for congestion, rhinorrhea, sneezing and sore throat.   Eyes: Negative.  Negative for photophobia, pain and redness.  Respiratory: Negative for cough, chest tightness, shortness of breath and wheezing.   Cardiovascular: Negative for chest pain and palpitations.  Gastrointestinal: Negative for abdominal pain, constipation, diarrhea, nausea and vomiting.  Endocrine: Negative for cold intolerance, heat intolerance, polydipsia and polyphagia.  Genitourinary: Negative.  Negative for dysuria and frequency.  Musculoskeletal: Negative for arthralgias, back pain, joint swelling and neck pain.  Skin: Negative for rash.  Allergic/Immunologic: Negative for environmental allergies.  Neurological: Negative for dizziness, tremors, numbness and headaches.  Hematological: Negative for adenopathy. Does not bruise/bleed easily.  Psychiatric/Behavioral: Negative for behavioral problems, dysphoric mood and sleep disturbance. The patient is not nervous/anxious.     Today's Vitals   08/24/18 1133  BP: 102/66  Pulse: 78  Resp: 16  SpO2: 91%  Weight: 130 lb (59 kg)  Height: 5\' 4"  (1.626 m)     Physical Exam   Constitutional: She is oriented to person, place, and time. She appears well-developed and well-nourished. No distress.  HENT:  Head: Normocephalic and atraumatic.  Nose: Nose normal.  Mouth/Throat: Oropharynx is clear and moist. No oropharyngeal exudate.  Eyes: Pupils are equal, round, and reactive to light. Conjunctivae and EOM are normal.  Neck: Normal range of motion. Neck supple. No JVD present. No tracheal deviation present. No thyromegaly present.  Cardiovascular: Normal rate, regular rhythm and normal heart sounds. Exam reveals no gallop and no friction rub.  No murmur heard. Pulmonary/Chest: Effort normal and breath sounds normal. No respiratory distress. She has no wheezes. She has no rales. She exhibits no tenderness.  Abdominal: Soft. Bowel sounds are normal. There is no tenderness. There is no guarding.  Musculoskeletal: Normal range of motion.  Lymphadenopathy:    She has no cervical adenopathy.  Neurological: She is alert and oriented to person, place, and time. No cranial nerve deficit.  Skin: Skin is warm and dry. She is not diaphoretic.  Psychiatric: She has a normal mood and affect. Her behavior is normal. Judgment and thought content normal.  Nursing note and vitals reviewed.  Assessment/Plan: 1. Gastroesophageal reflux disease without esophagitis Improved. Will continue with increased dose of omeprazole 40mg  twice daily per GI. Continue sucralfate as prescribed. Avoid triggers. Recommend she sleep with HOB elevated at 30 degrees.  - omeprazole (PRILOSEC) 40 MG capsule; Take 1 capsule (40 mg total) by mouth 2 (two) times daily.  Dispense: 60 capsule; Refill: 3  2. Black stool Patient reports resolution. Cologuard to be ordered to screen for colon cancer. GI follow ups as scheduled  3. Chronic obstructive pulmonary disease, unspecified COPD type ([place]) Continue inhalers and respiratory medications as prescribed   4. Flu vaccine need - Flu Vaccine MDCK QUAD  PF  General Counseling: Clare verbalizes understanding of the findings of todays visit and agrees with plan of treatment. I have discussed any further diagnostic evaluation that may be needed or ordered today. We also reviewed her medications today. she has  been encouraged to call the office with any questions or concerns that should arise related to todays visit.  This patient was seen by Leretha Pol FNP Collaboration with Dr Lavera Guise as a part of collaborative care agreement  Orders Placed This Encounter  Procedures  . Flu Vaccine MDCK QUAD PF    Meds ordered this encounter  Medications  . omeprazole (PRILOSEC) 40 MG capsule    Sig: Take 1 capsule (40 mg total) by mouth 2 (two) times daily.    Dispense:  60 capsule    Refill:  3    Order Specific Question:   Supervising Provider    Answer:   Lavera Guise [[id number]]    Time spent: 14 Minutes      Dr Lavera Guise Internal medicine

## 2018-08-25 DIAGNOSIS — Z23 Encounter for immunization: Secondary | ICD-10-CM | POA: Insufficient documentation

## 2018-08-25 DIAGNOSIS — J449 Chronic obstructive pulmonary disease, unspecified: Secondary | ICD-10-CM | POA: Insufficient documentation

## 2018-08-25 DIAGNOSIS — K921 Melena: Secondary | ICD-10-CM | POA: Insufficient documentation

## 2018-08-25 DIAGNOSIS — K219 Gastro-esophageal reflux disease without esophagitis: Secondary | ICD-10-CM | POA: Insufficient documentation

## 2018-08-27 ENCOUNTER — Ambulatory Visit: Payer: Self-pay | Admitting: Adult Health

## 2018-08-28 ENCOUNTER — Other Ambulatory Visit: Payer: Self-pay

## 2018-08-28 ENCOUNTER — Other Ambulatory Visit: Payer: Self-pay | Admitting: Internal Medicine

## 2018-08-28 MED ORDER — POTASSIUM CHLORIDE ER 8 MEQ PO CPCR: ORAL_CAPSULE | ORAL | 0 refills | Status: DC

## 2018-09-07 ENCOUNTER — Other Ambulatory Visit: Payer: Self-pay

## 2018-09-08 ENCOUNTER — Encounter: Payer: Self-pay | Admitting: Internal Medicine

## 2018-09-08 ENCOUNTER — Ambulatory Visit: Payer: BLUE CROSS/BLUE SHIELD | Admitting: Internal Medicine

## 2018-09-08 VITALS — BP 136/72 | HR 80 | Resp 16 | Ht 64.0 in | Wt 127.0 lb

## 2018-09-08 DIAGNOSIS — J9611 Chronic respiratory failure with hypoxia: Secondary | ICD-10-CM | POA: Diagnosis not present

## 2018-09-08 DIAGNOSIS — F17209 Nicotine dependence, unspecified, with unspecified nicotine-induced disorders: Secondary | ICD-10-CM | POA: Diagnosis not present

## 2018-09-08 DIAGNOSIS — J449 Chronic obstructive pulmonary disease, unspecified: Secondary | ICD-10-CM | POA: Diagnosis not present

## 2018-09-08 DIAGNOSIS — R0602 Shortness of breath: Secondary | ICD-10-CM | POA: Diagnosis not present

## 2018-09-08 NOTE — Progress Notes (Signed)
[facility] [place], [place] [address]  Pulmonary Sleep Medicine   Office Visit Note  Patient Name: Rachael Blankenship DOB: 01/24/1956 MRN [id number]  Date of Service: 09/08/2018  Complaints/HPI: Pt here for follow up on COPD.  She reports continued SOB especially with exertion.  She denies recent hospitalizations or issues since last visit.  She did get approval for portable oxygen concentrator.  She has been using it when she walks, or goes out.  She reports good results with this. Unfortunately she continues to smoke approximately 6-8 cigarettes daily. We have once again discussed smoking cessation and the patient reports she is trying to cut back. She reports she has been using her inhalers as prescribed.   ROS  General: (-) fever, (-) chills, (-) night sweats, (-) weakness Skin: (-) rashes, (-) itching,. Eyes: (-) visual changes, (-) redness, (-) itching. Nose and Sinuses: (-) nasal stuffiness or itchiness, (-) postnasal drip, (-) nosebleeds, (-) sinus trouble. Mouth and Throat: (-) sore throat, (-) hoarseness. Neck: (-) swollen glands, (-) enlarged thyroid, (-) neck pain. Respiratory: + cough, (-) bloody sputum, + shortness of breath, - wheezing. Cardiovascular: - ankle swelling, (-) chest pain. Lymphatic: (-) lymph node enlargement. Neurologic: (-) numbness, (-) tingling. Psychiatric: (-) anxiety, (-) depression   Current Medication: Outpatient Encounter Medications as of 09/08/2018  Medication Sig  . alendronate (FOSAMAX) 70 MG tablet Take 70 mg by mouth once a week. Take with a full glass of water on an empty stomach.  . ALPRAZolam (XANAX) 0.25 MG tablet Take 1 tablet (0.25 mg total) by mouth 3 (three) times daily as needed for anxiety.  Marland Kitchen ascorbic acid (VITAMIN C) 500 MG tablet Take 500 mg by mouth daily.  Marland Kitchen aspirin 81 MG tablet Take 81 mg by mouth at bedtime.   Marland Kitchen buPROPion (WELLBUTRIN XL) 150 MG 24 hr tablet Take 1 tablet (150 mg total) by mouth  daily.  . Cholecalciferol (VITAMIN D-1000 MAX ST) 1000 units tablet Take 1,000 Units by mouth daily.   . citalopram (CELEXA) 20 MG tablet Take 20 mg by mouth daily.  . Coenzyme Q10 (CO Q-10) 100 MG CAPS Take 200 mg by mouth daily.   . furosemide (LASIX) 40 MG tablet Take 1 tablet (40 mg total) by mouth daily.  Marland Kitchen ibuprofen (ADVIL,MOTRIN) 800 MG tablet Take 800 mg by mouth every 8 (eight) hours as needed for mild pain.   . Ipratropium-Albuterol (COMBIVENT RESPIMAT) 20-100 MCG/ACT AERS respimat Inhale 1 puff into the lungs every 6 (six) hours as needed for wheezing or shortness of breath.   Marland Kitchen ipratropium-albuterol (DUONEB) 0.5-2.5 (3) MG/3ML SOLN Use 1 vial via nebulizer every 4 t0 6 hrs  . Magnesium 400 MG TABS Take 400 mg by mouth daily.   . mometasone (NASONEX) 50 MCG/ACT nasal spray Place 2 sprays into the nose daily.  . mometasone-formoterol (DULERA) 200-5 MCG/ACT AERO Inhale 1 puff into the lungs 2 (two) times daily.  . montelukast (SINGULAIR) 10 MG tablet TAKE 1 TABLET BY MOUTH EVERY DAY FOR RHINITIS OR ALLERGIES  . omeprazole (PRILOSEC) 40 MG capsule Take 1 capsule (40 mg total) by mouth 2 (two) times daily.  . OXYGEN Inhale into the lungs. Oxygen at night, 2 liters  . Potassium Chloride CR (MICRO-K) 8 MEQ CPCR capsule CR Take 1 cap po daily may take extra 2-3 times a week  . simvastatin (ZOCOR) 20 MG tablet Take 1 tablet (20 mg total) by mouth every evening. for cholesterol  . sucralfate (  CARAFATE) 1 g tablet Take 1 tablet (1 g total) by mouth 4 (four) times daily -  with meals and at bedtime.  Marland Kitchen tiotropium (SPIRIVA) 18 MCG inhalation capsule Place 1 capsule (18 mcg total) into inhaler and inhale daily.  . vitamin B-12 (CYANOCOBALAMIN) 1000 MCG tablet Take 1,000 mcg by mouth daily.   No facility-administered encounter medications on file as of 09/08/2018.     Surgical History: Past Surgical History:  Procedure Laterality Date  . ESOPHAGOGASTRODUODENOSCOPY N/A 07/29/2018   Procedure:  ESOPHAGOGASTRODUODENOSCOPY (EGD);  Surgeon: Toledo, Benay Pike, MD;  Location: [facility] ENDOSCOPY;  Service: Gastroenterology;  Laterality: N/A;  . KIDNEY STONE SURGERY    . TONSILLECTOMY      Medical History: Past Medical History:  Diagnosis Date  . COPD (chronic obstructive pulmonary disease) ([place])   . Endometriosis   . GERD (gastroesophageal reflux disease)   . Hyperlipidemia   . Kidney stones     Family History: Family History  Problem Relation Age of Onset  . Hypertension Mother   . Hyperlipidemia Mother   . GER disease Mother   . Heart attack Father        23 yrs ago  . Hypertension Father   . Parkinson's disease Father   . GER disease Father     Social History: Social History   Socioeconomic History  . Marital status: Married    Spouse name: Not on file  . Number of children: Not on file  . Years of education: Not on file  . Highest education level: Not on file  Occupational History  . Not on file  Social Needs  . Financial resource strain: Not on file  . Food insecurity:    Worry: Not on file    Inability: Not on file  . Transportation needs:    Medical: Not on file    Non-medical: Not on file  Tobacco Use  . Smoking status: Current Every Day Smoker    Packs/day: 1.00    Types: Cigarettes    Start date: 11/09/2017  . Smokeless tobacco: Never Used  . Tobacco comment: pt cut back  smoking 1-2 daily  Substance and Sexual Activity  . Alcohol use: Yes    Comment: socially  . Drug use: No  . Sexual activity: Not on file  Lifestyle  . Physical activity:    Days per week: Not on file    Minutes per session: Not on file  . Stress: Not on file  Relationships  . Social connections:    Talks on phone: Not on file    Gets together: Not on file    Attends religious service: Not on file    Active member of club or organization: Not on file    Attends meetings of clubs or organizations: Not on file    Relationship status: Not on file  . Intimate partner  violence:    Fear of current or ex partner: Not on file    Emotionally abused: Not on file    Physically abused: Not on file    Forced sexual activity: Not on file  Other Topics Concern  . Not on file  Social History Narrative  . Not on file    Vital Signs: Blood pressure 136/72, pulse 80, resp. rate 16, height 5\' 4"  (1.626 m), weight 127 lb (57.6 kg), SpO2 93 %.  Examination: General Appearance: The patient is well-developed, well-nourished, and in no distress. Skin: Gross inspection of skin unremarkable. Head: normocephalic, no gross deformities. Eyes:  no gross deformities noted. ENT: ears appear grossly normal no exudates. Neck: Supple. No thyromegaly. No LAD. Respiratory: Clear to auscultation bilaterally, slightly diminished in bases.. Cardiovascular: Normal S1 and S2 without murmur or rub. Extremities: No cyanosis. pulses are equal. Neurologic: Alert and oriented. No involuntary movements.  LABS: Recent Results (from the past 2160 hour(s))  Surgical pathology     Status: None   Collection Time: 07/29/18 11:02 AM  Result Value Ref Range   SURGICAL PATHOLOGY      Surgical Pathology CASE: [id number] PATIENT: Ginger Carne Surgical Pathology Report     SPECIMEN SUBMITTED: A. Stomach, antrum and body; cbx  CLINICAL HISTORY: None provided  PRE-OPERATIVE DIAGNOSIS: Epigastric pain, melena  POST-OPERATIVE DIAGNOSIS: None provided.     DIAGNOSIS: A. STOMACH, ANTRUM AND BODY; COLD BIOPSY: - ANTRAL MUCOSA WITH MILD CHRONIC NON-SPECIFIC GASTRITIS. - OXYNTIC MUCOSA WITH MILD CHRONIC GASTRITIS AND CHANGES CONSISTENT WITH PROTON PUMP INHIBITOR USE. - NEGATIVE FOR H. PYLORI, DYSPLASIA, AND MALIGNANCY.  GROSS DESCRIPTION: A. Labeled: Antrum and body cbx Received: In formalin Tissue fragment(s): 2 Size: 0.4 cm Description: Pink-tan fragments Entirely submitted in one cassette.    Final Diagnosis performed by Quay Burow, MD.   Electronically  signed 07/31/2018 8:27:40AM The electronic signature indicates that the named Attending Pathologist has evaluated the specimen  Technical component performed at [facility], [place], [place], [place] [address] Lab: [phone] Dir: Rush Farmer, MD, MMM  Professional component performed at [facility], [place], [place], [place] [address] Lab: [phone] Dir: Dellia Nims. Reuel Derby, MD     Radiology: Nm Hepato W/eject Fract  Result Date: 08/19/2018 CLINICAL DATA:  Epigastric and upper abdominal pain EXAM: NUCLEAR MEDICINE HEPATOBILIARY IMAGING WITH GALLBLADDER EF VIEWS: Anterior, right lateral right upper quadrant RADIOPHARMACEUTICALS:  5.693 mCi Tc-63m  Choletec IV COMPARISON:  None. FINDINGS: Liver uptake of radiotracer is unremarkable. There is prompt visualization of gallbladder and small bowel, indicating patency of the cystic and common bile ducts. The patient consumed 8 ounces of Ensure orally with calculation of the computer generated ejection fraction of radiotracer from the gallbladder. The patient did not report clinical symptoms with the oral Ensure consumption. The computer generated ejection fraction of radiotracer from the gallbladder is normal at 54%, normal greater than 33% using the oral agent. IMPRESSION: Study within normal limits. Electronically Signed   By: Lowella Grip III M.D.   On: 08/19/2018 13:59    No results found.  Nm Hepato W/eject Fract  Result Date: 08/19/2018 CLINICAL DATA:  Epigastric and upper abdominal pain EXAM: NUCLEAR MEDICINE HEPATOBILIARY IMAGING WITH GALLBLADDER EF VIEWS: Anterior, right lateral right upper quadrant RADIOPHARMACEUTICALS:  5.693 mCi Tc-58m  Choletec IV COMPARISON:  None. FINDINGS: Liver uptake of radiotracer is unremarkable. There is prompt visualization of gallbladder and small bowel, indicating patency of the cystic and common bile ducts. The patient consumed 8 ounces of Ensure orally with  calculation of the computer generated ejection fraction of radiotracer from the gallbladder. The patient did not report clinical symptoms with the oral Ensure consumption. The computer generated ejection fraction of radiotracer from the gallbladder is normal at 54%, normal greater than 33% using the oral agent. IMPRESSION: Study within normal limits. Electronically Signed   By: Lowella Grip III M.D.   On: 08/19/2018 13:59      Assessment and Plan: Patient Active Problem List   Diagnosis Date Noted  . COPD (chronic obstructive pulmonary disease) ([place]) 08/25/2018  . Gastroesophageal reflux disease without esophagitis 08/25/2018  .  Black stool 08/25/2018  . Flu vaccine need 08/25/2018  . Acute respiratory failure with hypoxemia ([place]) 05/21/2018  . Screening for breast cancer 05/13/2018  . Acute upper respiratory infection 05/13/2018  . Other fatigue 05/13/2018  . Generalized anxiety disorder 05/13/2018  . Vitamin D deficiency 05/13/2018  . Screening for malignant neoplasm of cervix 05/13/2018  . Dysuria 05/13/2018    1. Chronic respiratory failure with hypoxia ([place]) Pt doing well.  Using oxygen at 2lpm via [place] continuously at night, and intermittently through out the day.    2. Chronic obstructive pulmonary disease, unspecified COPD type ([place]) Stable at thsi time.  Continue inhalers as prescribed.    3. Nicotine dependence with nicotine-induced disorder, unspecified nicotine product type Smoking cessation counseling: 1. Pt acknowledges the risks of long term smoking, she will try to quite smoking. 2. Options for different medications including nicotine products, chewing gum, patch etc, Wellbutrin and Chantix is discussed 3. Goal and date of compete cessation is discussed 4. Total time spent in smoking cessation is 15 min.  4. SOB (shortness of breath) - Spirometry with Graph  General Counseling: I have discussed the findings of the evaluation and examination with Neoma Laming.  I  have also discussed any further diagnostic evaluation thatmay be needed or ordered today. Donelle verbalizes understanding of the findings of todays visit. We also reviewed her medications today and discussed drug interactions and side effects including but not limited excessive drowsiness and altered mental states. We also discussed that there is always a risk not just to her but also people around her. she has been encouraged to call the office with any questions or concerns that should arise related to todays visit.    Time spent: 20 This patient was seen by Orson Gear AGNP-C in Collaboration with Dr. Devona Konig as a part of collaborative care agreement.   I have personally obtained a history, examined the patient, evaluated laboratory and imaging results, formulated the assessment and plan and placed orders.    Allyne Gee, MD [facility] Pulmonary and Critical Care Sleep medicine

## 2018-09-08 NOTE — Patient Instructions (Signed)

## 2018-09-15 ENCOUNTER — Ambulatory Visit: Payer: Self-pay | Admitting: Internal Medicine

## 2018-09-27 ENCOUNTER — Other Ambulatory Visit: Payer: Self-pay | Admitting: Adult Health

## 2018-09-27 ENCOUNTER — Other Ambulatory Visit: Payer: Self-pay | Admitting: Nurse Practitioner

## 2018-10-24 ENCOUNTER — Other Ambulatory Visit: Payer: Self-pay | Admitting: Adult Health

## 2018-10-27 ENCOUNTER — Other Ambulatory Visit: Payer: Self-pay

## 2018-10-27 MED ORDER — SIMVASTATIN 20 MG PO TABS: 20.0000 mg | ORAL_TABLET | Freq: Every evening | ORAL | 1 refills | Status: DC

## 2018-11-06 ENCOUNTER — Encounter: Payer: Self-pay | Admitting: Adult Health

## 2018-11-06 ENCOUNTER — Ambulatory Visit: Payer: BLUE CROSS/BLUE SHIELD | Admitting: Adult Health

## 2018-11-06 ENCOUNTER — Other Ambulatory Visit: Payer: Self-pay | Admitting: Adult Health

## 2018-11-06 VITALS — BP 110/66 | HR 61 | Resp 16 | Ht 64.0 in | Wt 128.0 lb

## 2018-11-06 DIAGNOSIS — J449 Chronic obstructive pulmonary disease, unspecified: Secondary | ICD-10-CM | POA: Diagnosis not present

## 2018-11-06 DIAGNOSIS — F17209 Nicotine dependence, unspecified, with unspecified nicotine-induced disorders: Secondary | ICD-10-CM

## 2018-11-06 DIAGNOSIS — F411 Generalized anxiety disorder: Secondary | ICD-10-CM | POA: Diagnosis not present

## 2018-11-06 DIAGNOSIS — J0111 Acute recurrent frontal sinusitis: Secondary | ICD-10-CM

## 2018-11-06 MED ORDER — ALPRAZOLAM 0.25 MG PO TABS: 0.2500 mg | ORAL_TABLET | Freq: Three times a day (TID) | ORAL | 1 refills | Status: DC | PRN

## 2018-11-06 MED ORDER — IPRATROPIUM-ALBUTEROL 20-100 MCG/ACT IN AERS: 1.0000 | INHALATION_SPRAY | Freq: Four times a day (QID) | RESPIRATORY_TRACT | 3 refills | Status: DC | PRN

## 2018-11-06 MED ORDER — AMOXICILLIN-POT CLAVULANATE 875-125 MG PO TABS: 1.0000 | ORAL_TABLET | Freq: Two times a day (BID) | ORAL | 0 refills | Status: DC

## 2018-11-06 MED ORDER — IPRATROPIUM-ALBUTEROL 20-100 MCG/ACT IN AERS: 1.0000 | INHALATION_SPRAY | Freq: Four times a day (QID) | RESPIRATORY_TRACT | 1 refills | Status: DC | PRN

## 2018-11-06 NOTE — Progress Notes (Signed)
[facility] [place], [place] [address]  Internal MEDICINE  Office Visit Note  Patient Name: Rachael Blankenship  [id number]  [id number]  Date of Service: 11/06/2018  Chief Complaint  Patient presents with  . Anxiety    medication refill     HPI  Patient here for follow-up on anxiety and COPD.  She reports her anxiety has been well controlled.  She states that her COPD is moderately controlled.  She requires an abundance of Combivent.  She is requesting refills on her medications.  She denies any chest pain, shortness of breath, palpitations or headaches.  She also reports some sinus pain and pressure with some ear fullness.  She states that she is had postnasal drip that is caused her sore throat and she has felt slightly feverish with some chills over the past few days.   Current Medication: Outpatient Encounter Medications as of 11/06/2018  Medication Sig  . alendronate (FOSAMAX) 70 MG tablet Take 70 mg by mouth once a week. Take with a full glass of water on an empty stomach.  . ALPRAZolam (XANAX) 0.25 MG tablet Take 1 tablet (0.25 mg total) by mouth 3 (three) times daily as needed for anxiety.  Marland Kitchen ascorbic acid (VITAMIN C) 500 MG tablet Take 500 mg by mouth daily.  Marland Kitchen aspirin 81 MG tablet Take 81 mg by mouth at bedtime.   Marland Kitchen buPROPion (WELLBUTRIN XL) 150 MG 24 hr tablet Take 1 tablet (150 mg total) by mouth daily.  . Cholecalciferol (VITAMIN D-1000 MAX ST) 1000 units tablet Take 1,000 Units by mouth daily.   . citalopram (CELEXA) 20 MG tablet Take 20 mg by mouth daily.  . Coenzyme Q10 (CO Q-10) 100 MG CAPS Take 200 mg by mouth daily.   . furosemide (LASIX) 40 MG tablet Take 1 tablet (40 mg total) by mouth daily.  Marland Kitchen ibuprofen (ADVIL,MOTRIN) 800 MG tablet Take 800 mg by mouth every 8 (eight) hours as needed for mild pain.   Marland Kitchen ipratropium-albuterol (DUONEB) 0.5-2.5 (3) MG/3ML SOLN Use 1 vial via nebulizer every 4 t0 6 hrs  . Magnesium 400 MG TABS Take 400 mg by  mouth daily.   . mometasone (NASONEX) 50 MCG/ACT nasal spray Place 2 sprays into the nose daily.  . mometasone-formoterol (DULERA) 200-5 MCG/ACT AERO Inhale 1 puff into the lungs 2 (two) times daily.  . montelukast (SINGULAIR) 10 MG tablet TAKE 1 TABLET BY MOUTH EVERY DAY FOR RHINITIS OR ALLERGIES  . omeprazole (PRILOSEC) 40 MG capsule Take 1 capsule (40 mg total) by mouth 2 (two) times daily.  . OXYGEN Inhale into the lungs. Oxygen at night, 2 liters  . Potassium Chloride CR (MICRO-K) 8 MEQ CPCR capsule CR TAKE 1 CAPSULE BY MOUTH DAILY. MAY TAKE EXTRA 2-3 TIMES A WEEK  . simvastatin (ZOCOR) 20 MG tablet Take 1 tablet (20 mg total) by mouth every evening. for cholesterol  . sucralfate (CARAFATE) 1 g tablet TAKE 1 TABLET(1 GRAM) BY MOUTH FOUR TIMES DAILY WITH MEALS AND AT BEDTIME  . tiotropium (SPIRIVA) 18 MCG inhalation capsule Place 1 capsule (18 mcg total) into inhaler and inhale daily.  . vitamin B-12 (CYANOCOBALAMIN) 1000 MCG tablet Take 1,000 mcg by mouth daily.  . [DISCONTINUED] ALPRAZolam (XANAX) 0.25 MG tablet Take 1 tablet (0.25 mg total) by mouth 3 (three) times daily as needed for anxiety.  . [DISCONTINUED] Ipratropium-Albuterol (COMBIVENT RESPIMAT) 20-100 MCG/ACT AERS respimat Inhale 1 puff into the lungs every 6 (six) hours as needed for wheezing  or shortness of breath.   Marland Kitchen amoxicillin-clavulanate (AUGMENTIN) 875-125 MG tablet Take 1 tablet by mouth 2 (two) times daily.  . Ipratropium-Albuterol (COMBIVENT RESPIMAT) 20-100 MCG/ACT AERS respimat Inhale 1 puff into the lungs every 6 (six) hours as needed for wheezing or shortness of breath.   No facility-administered encounter medications on file as of 11/06/2018.     Surgical History: Past Surgical History:  Procedure Laterality Date  . ESOPHAGOGASTRODUODENOSCOPY N/A 07/29/2018   Procedure: ESOPHAGOGASTRODUODENOSCOPY (EGD);  Surgeon: Toledo, Benay Pike, MD;  Location: [facility] ENDOSCOPY;  Service: Gastroenterology;  Laterality: N/A;  .  KIDNEY STONE SURGERY    . TONSILLECTOMY      Medical History: Past Medical History:  Diagnosis Date  . COPD (chronic obstructive pulmonary disease) ([place])   . Endometriosis   . GERD (gastroesophageal reflux disease)   . Hyperlipidemia   . Kidney stones     Family History: Family History  Problem Relation Age of Onset  . Hypertension Mother   . Hyperlipidemia Mother   . GER disease Mother   . Heart attack Father        23 yrs ago  . Hypertension Father   . Parkinson's disease Father   . GER disease Father     Social History   Socioeconomic History  . Marital status: Married    Spouse name: Not on file  . Number of children: Not on file  . Years of education: Not on file  . Highest education level: Not on file  Occupational History  . Not on file  Social Needs  . Financial resource strain: Not on file  . Food insecurity:    Worry: Not on file    Inability: Not on file  . Transportation needs:    Medical: Not on file    Non-medical: Not on file  Tobacco Use  . Smoking status: Current Every Day Smoker    Packs/day: 1.00    Types: Cigarettes    Start date: 11/09/2017  . Smokeless tobacco: Never Used  . Tobacco comment: pt cut back  smoking 1-2 daily  Substance and Sexual Activity  . Alcohol use: Yes    Comment: socially  . Drug use: No  . Sexual activity: Not on file  Lifestyle  . Physical activity:    Days per week: Not on file    Minutes per session: Not on file  . Stress: Not on file  Relationships  . Social connections:    Talks on phone: Not on file    Gets together: Not on file    Attends religious service: Not on file    Active member of club or organization: Not on file    Attends meetings of clubs or organizations: Not on file    Relationship status: Not on file  . Intimate partner violence:    Fear of current or ex partner: Not on file    Emotionally abused: Not on file    Physically abused: Not on file    Forced sexual activity: Not on  file  Other Topics Concern  . Not on file  Social History Narrative  . Not on file      Review of Systems  Constitutional: Negative for chills, fatigue and unexpected weight change.  HENT: Positive for postnasal drip, sinus pressure and sore throat. Negative for congestion, rhinorrhea and sneezing.   Eyes: Negative for photophobia, pain and redness.  Respiratory: Positive for choking and shortness of breath. Negative for cough and chest tightness.  Cardiovascular: Negative for chest pain and palpitations.  Gastrointestinal: Negative for abdominal pain, constipation, diarrhea, nausea and vomiting.  Endocrine: Negative.   Genitourinary: Negative for dysuria and frequency.  Musculoskeletal: Negative for arthralgias, back pain, joint swelling and neck pain.  Skin: Negative for rash.  Allergic/Immunologic: Negative.   Neurological: Negative for tremors and numbness.  Hematological: Negative for adenopathy. Does not bruise/bleed easily.  Psychiatric/Behavioral: Negative for behavioral problems and sleep disturbance. The patient is not nervous/anxious.     Vital Signs: BP 110/66 (BP Location: Left Arm, Patient Position: Sitting, Cuff Size: Normal)   Pulse 61   Resp 16   Ht 5\' 4"  (1.626 m)   Wt 128 lb (58.1 kg)   SpO2 90%   BMI 21.97 kg/m    Physical Exam Vitals signs and nursing note reviewed.  Constitutional:      General: She is not in acute distress.    Appearance: She is well-developed. She is not diaphoretic.  HENT:     Head: Normocephalic and atraumatic.     Mouth/Throat:     Pharynx: No oropharyngeal exudate.  Eyes:     Pupils: Pupils are equal, round, and reactive to light.  Neck:     Musculoskeletal: Normal range of motion and neck supple.     Thyroid: No thyromegaly.     Vascular: No JVD.     Trachea: No tracheal deviation.  Cardiovascular:     Rate and Rhythm: Normal rate and regular rhythm.     Heart sounds: Normal heart sounds. No murmur. No friction rub.  No gallop.   Pulmonary:     Effort: Pulmonary effort is normal. No respiratory distress.     Breath sounds: Normal breath sounds. No wheezing or rales.  Chest:     Chest wall: No tenderness.  Abdominal:     Palpations: Abdomen is soft.     Tenderness: There is no abdominal tenderness. There is no guarding.  Musculoskeletal: Normal range of motion.  Lymphadenopathy:     Cervical: No cervical adenopathy.  Skin:    General: Skin is warm and dry.  Neurological:     Mental Status: She is alert and oriented to person, place, and time.     Cranial Nerves: No cranial nerve deficit.  Psychiatric:        Behavior: Behavior normal.        Thought Content: Thought content normal.        Judgment: Judgment normal.    Assessment/Plan: 1. Acute recurrent frontal sinusitis Patient provided with course of Augmentin for sinus infection.  She was instructed to return to clinic in 7 to 10 days if symptoms fail to improve.  She was encouraged to continue over-the-counter medications for symptom management. - amoxicillin-clavulanate (AUGMENTIN) 875-125 MG tablet; Take 1 tablet by mouth 2 (two) times daily.  Dispense: 14 tablet; Refill: 0  2. Generalized anxiety disorder Refill patient's Xanax at this time. - ALPRAZolam (XANAX) 0.25 MG tablet; Take 1 tablet (0.25 mg total) by mouth 3 (three) times daily as needed for anxiety.  Dispense: 90 tablet; Refill: 1 Refilled Controlled medications today. Reviewed risks and possible side effects associated with taking Stimulants. Combination of these drugs with other psychotropic medications could cause dizziness and drowsiness. Pt needs to Monitor symptoms and exercise caution in driving and operating heavy machinery to avoid damages to oneself, to others and to the surroundings. Patient verbalized understanding in this matter. Dependence and abuse for these drugs will be monitored closely. A Controlled substance policy  and procedure is on file which allows [facility] to order a urine drug screen test at any visit. Patient understands and agrees with the plan..  3. Nicotine dependence with nicotine-induced disorder, unspecified nicotine product type Smoking cessation counseling: 1. Pt acknowledges the risks of long term smoking, she will try to quite smoking. 2. Options for different medications including nicotine products, chewing gum, patch etc, Wellbutrin and Chantix is discussed 3. Goal and date of compete cessation is discussed 4. Total time spent in smoking cessation is 15 min.   4. Obstructive chronic bronchitis without exacerbation ([place]) Refill patient's Combivent inhalers. - Ipratropium-Albuterol (COMBIVENT RESPIMAT) 20-100 MCG/ACT AERS respimat; Inhale 1 puff into the lungs every 6 (six) hours as needed for wheezing or shortness of breath.  Dispense: 3 Inhaler; Refill: 1  General Counseling: Braya verbalizes understanding of the findings of todays visit and agrees with plan of treatment. I have discussed any further diagnostic evaluation that may be needed or ordered today. We also reviewed her medications today. she has been encouraged to call the office with any questions or concerns that should arise related to todays visit.    No orders of the defined types were placed in this encounter.   Meds ordered this encounter  Medications  . ALPRAZolam (XANAX) 0.25 MG tablet    Sig: Take 1 tablet (0.25 mg total) by mouth 3 (three) times daily as needed for anxiety.    Dispense:  90 tablet    Refill:  1  . Ipratropium-Albuterol (COMBIVENT RESPIMAT) 20-100 MCG/ACT AERS respimat    Sig: Inhale 1 puff into the lungs every 6 (six) hours as needed for wheezing or shortness of breath.    Dispense:  3 Inhaler    Refill:  1  . amoxicillin-clavulanate (AUGMENTIN) 875-125 MG tablet    Sig: Take 1 tablet by mouth 2 (two) times daily.    Dispense:  14 tablet    Refill:  0    Time spent:25 Minutes   This patient was seen by Orson Gear AGNP-C in Collaboration with Dr Lavera Guise as a part of collaborative care agreement     Kendell Bane AGNP-C Internal medicine

## 2018-11-22 ENCOUNTER — Other Ambulatory Visit: Payer: Self-pay | Admitting: Adult Health

## 2018-11-26 ENCOUNTER — Ambulatory Visit: Payer: BLUE CROSS/BLUE SHIELD | Admitting: Nurse Practitioner

## 2018-11-26 ENCOUNTER — Encounter: Payer: Self-pay | Admitting: Nurse Practitioner

## 2018-11-26 VITALS — BP 116/74 | HR 91 | Resp 16 | Ht 64.0 in | Wt 124.2 lb

## 2018-11-26 DIAGNOSIS — J441 Chronic obstructive pulmonary disease with (acute) exacerbation: Secondary | ICD-10-CM

## 2018-11-26 DIAGNOSIS — F17210 Nicotine dependence, cigarettes, uncomplicated: Secondary | ICD-10-CM

## 2018-11-26 DIAGNOSIS — R0602 Shortness of breath: Secondary | ICD-10-CM

## 2018-11-26 DIAGNOSIS — F411 Generalized anxiety disorder: Secondary | ICD-10-CM

## 2018-11-26 MED ORDER — PREDNISONE 10 MG (21) PO TBPK: ORAL_TABLET | ORAL | 0 refills | Status: DC

## 2018-11-26 MED ORDER — CIPROFLOXACIN HCL 500 MG PO TABS: 500.0000 mg | ORAL_TABLET | Freq: Two times a day (BID) | ORAL | 0 refills | Status: DC

## 2018-11-26 NOTE — Progress Notes (Signed)
[facility] [place], [place] [address]  Internal MEDICINE  Office Visit Note  Patient Name: Rachael Blankenship  [id number]  [id number]  Date of Service: 11/26/2018  Chief Complaint  Patient presents with  . Medical Management of Chronic Issues    4 month follow up  . Cough    pt has been coughing sneezing for the past couple of weks been having to use Oxygen more often due to difficulty breathing, pt still has symptoms from sinus infection that she was seen for a few weeks ago  . Diarrhea  . Headache  . Chills    no fever, some chills, and camps in her legs mostly at night    The patient is here for routine follow up visit. She is c/o shortness of breath, wheezing, cough, and congestion. Has been going on for at least 3 weeks. She was treated 11/06/2018 for sinusitis. Did ten days of augmentin. Really never got better. Congestion has worsened in her chest. Is having to use her nasal cannula oxygen much more often due to worsening shortness of breath. Feeling very tired .      Current Medication: Outpatient Encounter Medications as of 11/26/2018  Medication Sig  . alendronate (FOSAMAX) 70 MG tablet Take 70 mg by mouth once a week. Take with a full glass of water on an empty stomach.  . ALPRAZolam (XANAX) 0.25 MG tablet Take 1 tablet (0.25 mg total) by mouth 3 (three) times daily as needed for anxiety.  Marland Kitchen ascorbic acid (VITAMIN C) 500 MG tablet Take 500 mg by mouth daily.  Marland Kitchen aspirin 81 MG tablet Take 81 mg by mouth at bedtime.   Marland Kitchen buPROPion (WELLBUTRIN XL) 150 MG 24 hr tablet Take 1 tablet (150 mg total) by mouth daily.  . Cholecalciferol (VITAMIN D-1000 MAX ST) 1000 units tablet Take 1,000 Units by mouth daily.   . citalopram (CELEXA) 20 MG tablet Take 20 mg by mouth daily.  . Coenzyme Q10 (CO Q-10) 100 MG CAPS Take 200 mg by mouth daily.   . furosemide (LASIX) 40 MG tablet Take 1 tablet (40 mg total) by mouth daily.  Marland Kitchen ibuprofen (ADVIL,MOTRIN) 800 MG tablet  Take 800 mg by mouth every 8 (eight) hours as needed for mild pain.   . Ipratropium-Albuterol (COMBIVENT RESPIMAT) 20-100 MCG/ACT AERS respimat Inhale 1 puff into the lungs every 6 (six) hours as needed for wheezing or shortness of breath.  Marland Kitchen ipratropium-albuterol (DUONEB) 0.5-2.5 (3) MG/3ML SOLN Use 1 vial via nebulizer every 4 t0 6 hrs  . Magnesium 400 MG TABS Take 400 mg by mouth daily.   . mometasone (NASONEX) 50 MCG/ACT nasal spray Place 2 sprays into the nose daily.  . mometasone-formoterol (DULERA) 200-5 MCG/ACT AERO Inhale 1 puff into the lungs 2 (two) times daily.  . montelukast (SINGULAIR) 10 MG tablet TAKE 1 TABLET BY MOUTH EVERY DAY FOR RHINITIS OR ALLERGIES  . omeprazole (PRILOSEC) 40 MG capsule Take 1 capsule (40 mg total) by mouth 2 (two) times daily.  . OXYGEN Inhale into the lungs. Oxygen at night, 2 liters  . Potassium Chloride CR (MICRO-K) 8 MEQ CPCR capsule CR TAKE 1 CAPSULE BY MOUTH DAILY. MAY TAKE EXTRA 2-3 TIMES A WEEK  . simvastatin (ZOCOR) 20 MG tablet Take 1 tablet (20 mg total) by mouth every evening. for cholesterol  . sucralfate (CARAFATE) 1 g tablet TAKE 1 TABLET(1 GRAM) BY MOUTH FOUR TIMES DAILY WITH MEALS AND AT BEDTIME  . tiotropium (SPIRIVA) 18  MCG inhalation capsule Place 1 capsule (18 mcg total) into inhaler and inhale daily.  . vitamin B-12 (CYANOCOBALAMIN) 1000 MCG tablet Take 1,000 mcg by mouth daily.  . ciprofloxacin (CIPRO) 500 MG tablet Take 1 tablet (500 mg total) by mouth 2 (two) times daily.  . predniSONE (STERAPRED UNI-PAK 21 TAB) 10 MG (21) TBPK tablet 6 day taper - take by mouth as directed for 6 days  . [DISCONTINUED] amoxicillin-clavulanate (AUGMENTIN) 875-125 MG tablet Take 1 tablet by mouth 2 (two) times daily. (Patient not taking: Reported on 11/26/2018)   No facility-administered encounter medications on file as of 11/26/2018.     Surgical History: Past Surgical History:  Procedure Laterality Date  . ESOPHAGOGASTRODUODENOSCOPY N/A 07/29/2018    Procedure: ESOPHAGOGASTRODUODENOSCOPY (EGD);  Surgeon: Toledo, Benay Pike, MD;  Location: [facility] ENDOSCOPY;  Service: Gastroenterology;  Laterality: N/A;  . KIDNEY STONE SURGERY    . TONSILLECTOMY      Medical History: Past Medical History:  Diagnosis Date  . COPD (chronic obstructive pulmonary disease) ([place])   . Endometriosis   . GERD (gastroesophageal reflux disease)   . Hyperlipidemia   . Kidney stones     Family History: Family History  Problem Relation Age of Onset  . Hypertension Mother   . Hyperlipidemia Mother   . GER disease Mother   . Heart attack Father        23 yrs ago  . Hypertension Father   . Parkinson's disease Father   . GER disease Father     Social History   Socioeconomic History  . Marital status: Married    Spouse name: Not on file  . Number of children: Not on file  . Years of education: Not on file  . Highest education level: Not on file  Occupational History  . Not on file  Social Needs  . Financial resource strain: Not on file  . Food insecurity:    Worry: Not on file    Inability: Not on file  . Transportation needs:    Medical: Not on file    Non-medical: Not on file  Tobacco Use  . Smoking status: Current Every Day Smoker    Packs/day: 1.00    Types: Cigarettes    Start date: 11/09/2017  . Smokeless tobacco: Never Used  . Tobacco comment: pt cut back  smoking 1-2 daily  Substance and Sexual Activity  . Alcohol use: Yes    Comment: socially  . Drug use: No  . Sexual activity: Not on file  Lifestyle  . Physical activity:    Days per week: Not on file    Minutes per session: Not on file  . Stress: Not on file  Relationships  . Social connections:    Talks on phone: Not on file    Gets together: Not on file    Attends religious service: Not on file    Active member of club or organization: Not on file    Attends meetings of clubs or organizations: Not on file    Relationship status: Not on file  . Intimate partner violence:     Fear of current or ex partner: Not on file    Emotionally abused: Not on file    Physically abused: Not on file    Forced sexual activity: Not on file  Other Topics Concern  . Not on file  Social History Narrative  . Not on file      Review of Systems  Constitutional: Positive for activity change and  fatigue. Negative for appetite change, chills, fever and unexpected weight change.  HENT: Positive for congestion, rhinorrhea and sinus pressure. Negative for sneezing and sore throat.   Respiratory: Positive for cough and shortness of breath. Negative for chest tightness and wheezing.        Worse with exertion.   Cardiovascular: Negative for chest pain and palpitations.  Gastrointestinal: Negative for abdominal pain, constipation, diarrhea, nausea and vomiting.  Endocrine: Negative for cold intolerance, heat intolerance, polyphagia and polyuria.  Genitourinary:       Did develop some vaginal itching and irritation after first round of antibiotics .  Musculoskeletal: Negative for arthralgias, back pain, joint swelling and neck pain.  Skin: Negative for rash.  Allergic/Immunologic: Positive for environmental allergies.  Neurological: Positive for headaches. Negative for dizziness, tremors and numbness.  Hematological: Negative for adenopathy. Does not bruise/bleed easily.  Psychiatric/Behavioral: Negative for behavioral problems, dysphoric mood and sleep disturbance. The patient is not nervous/anxious.    Today's Vitals   11/26/18 1120 11/26/18 1123  BP: 116/74 116/74  Pulse: 91 91  Resp: 16   SpO2: 92% 92%  Weight: 124 lb 3.2 oz (56.3 kg)   Height: 5\' 4"  (1.626 m)     Physical Exam Vitals signs and nursing note reviewed.  Constitutional:      General: She is not in acute distress.    Appearance: She is well-developed. She is ill-appearing. She is not diaphoretic.  HENT:     Head: Normocephalic and atraumatic.     Nose: Congestion present.     Right Sinus: Frontal sinus  tenderness present.     Left Sinus: Frontal sinus tenderness present.     Mouth/Throat:     Pharynx: No oropharyngeal exudate.  Eyes:     Extraocular Movements: Extraocular movements intact.     Conjunctiva/sclera: Conjunctivae normal.     Pupils: Pupils are equal, round, and reactive to light.  Neck:     Musculoskeletal: Normal range of motion and neck supple.     Thyroid: No thyromegaly.     Vascular: No JVD.     Trachea: No tracheal deviation.  Cardiovascular:     Rate and Rhythm: Normal rate and regular rhythm.     Heart sounds: Normal heart sounds. No murmur. No friction rub. No gallop.   Pulmonary:     Effort: Pulmonary effort is normal. No respiratory distress.     Breath sounds: Normal breath sounds. No wheezing or rales.     Comments: Wheezing throughout the lung fields, inspiratory and expiratory. Deep, congested, productive cough noted. Using nasal cannula oxygen at all times.  Chest:     Chest wall: No tenderness.  Abdominal:     General: Bowel sounds are normal.     Palpations: Abdomen is soft.     Tenderness: There is no abdominal tenderness. There is no guarding.  Musculoskeletal: Normal range of motion.  Lymphadenopathy:     Cervical: No cervical adenopathy.  Skin:    General: Skin is warm and dry.  Neurological:     Mental Status: She is alert and oriented to person, place, and time.     Cranial Nerves: No cranial nerve deficit.  Psychiatric:        Behavior: Behavior normal.        Thought Content: Thought content normal.        Judgment: Judgment normal.    Assessment/Plan: 1. COPD with acute exacerbation (HCC) Start ciprofloxacin 500mg  twice daily for 7 days. Add prednisone dose pack.  Take as directed for 6 days. Use inhalers and respiratory medications as prescribed.  - predniSONE (STERAPRED UNI-PAK 21 TAB) 10 MG (21) TBPK tablet; 6 day taper - take by mouth as directed for 6 days  Dispense: 21 tablet; Refill: 0 - ciprofloxacin (CIPRO) 500 MG tablet;  Take 1 tablet (500 mg total) by mouth 2 (two) times daily.  Dispense: 14 tablet; Refill: 0  2. SOB (shortness of breath) Inhalers and neb treatments as needed and as prescribed. Use oxygen per nasal cannula as needed.   3. Cigarette smoker Discussed importance of smoking cessation on improvement of COPD and acute symptoms. She voiced understanding and states she knows she needs to quit smoking.   4. Generalized anxiety disorder Continue citalopram and bupropion as prescribed. May take alprazolam as needed and as prescribed   General Counseling: konstantina nachreiner understanding of the findings of todays visit and agrees with plan of treatment. I have discussed any further diagnostic evaluation that may be needed or ordered today. We also reviewed her medications today. she has been encouraged to call the office with any questions or concerns that should arise related to todays visit.   Smoking cessation counseling: 1. Pt acknowledges the risks of long term smoking, she will try to quite smoking. 2. Options for different medications including nicotine products, chewing gum, patch etc, Wellbutrin and Chantix is discussed 3. Goal and date of compete cessation is discussed 4. Total time spent in smoking cessation is 10 min.  This patient was seen by [place] with Dr Lavera Guise as a part of collaborative care agreement  Meds ordered this encounter  Medications  . predniSONE (STERAPRED UNI-PAK 21 TAB) 10 MG (21) TBPK tablet    Sig: 6 day taper - take by mouth as directed for 6 days    Dispense:  21 tablet    Refill:  0    Order Specific Question:   Supervising Provider    Answer:   Lavera Guise [place]  . ciprofloxacin (CIPRO) 500 MG tablet    Sig: Take 1 tablet (500 mg total) by mouth 2 (two) times daily.    Dispense:  14 tablet    Refill:  0    Order Specific Question:   Supervising Provider    Answer:   Lavera Guise [[id number]]    Time spent: 30  Minutes      Dr Lavera Guise Internal medicine

## 2018-12-23 ENCOUNTER — Other Ambulatory Visit: Payer: Self-pay | Admitting: Adult Health

## 2019-01-25 ENCOUNTER — Other Ambulatory Visit: Payer: Self-pay | Admitting: Internal Medicine

## 2019-02-10 ENCOUNTER — Other Ambulatory Visit: Payer: Self-pay

## 2019-02-10 ENCOUNTER — Other Ambulatory Visit: Payer: Self-pay | Admitting: Internal Medicine

## 2019-02-10 DIAGNOSIS — K219 Gastro-esophageal reflux disease without esophagitis: Secondary | ICD-10-CM

## 2019-02-10 MED ORDER — OMEPRAZOLE 40 MG PO CPDR: 40.0000 mg | DELAYED_RELEASE_CAPSULE | Freq: Two times a day (BID) | ORAL | 3 refills | Status: DC

## 2019-02-23 ENCOUNTER — Other Ambulatory Visit: Payer: Self-pay

## 2019-02-23 DIAGNOSIS — J449 Chronic obstructive pulmonary disease, unspecified: Secondary | ICD-10-CM

## 2019-02-23 MED ORDER — IPRATROPIUM-ALBUTEROL 20-100 MCG/ACT IN AERS: 1.0000 | INHALATION_SPRAY | Freq: Four times a day (QID) | RESPIRATORY_TRACT | 1 refills | Status: DC | PRN

## 2019-03-03 ENCOUNTER — Telehealth: Payer: Self-pay

## 2019-03-03 NOTE — Telephone Encounter (Signed)
Kelly from [facility] Patient called wanting the pt last note from her last visit.  Faxed.

## 2019-03-15 ENCOUNTER — Encounter: Payer: Self-pay | Admitting: Adult Health

## 2019-03-15 ENCOUNTER — Ambulatory Visit (INDEPENDENT_AMBULATORY_CARE_PROVIDER_SITE_OTHER): Payer: Medicare Other | Admitting: Adult Health

## 2019-03-15 ENCOUNTER — Other Ambulatory Visit: Payer: Self-pay

## 2019-03-15 VITALS — BP 92/62 | HR 72 | Resp 16 | Ht 64.0 in | Wt 120.0 lb

## 2019-03-15 DIAGNOSIS — J9611 Chronic respiratory failure with hypoxia: Secondary | ICD-10-CM

## 2019-03-15 DIAGNOSIS — J449 Chronic obstructive pulmonary disease, unspecified: Secondary | ICD-10-CM

## 2019-03-15 DIAGNOSIS — F17209 Nicotine dependence, unspecified, with unspecified nicotine-induced disorders: Secondary | ICD-10-CM | POA: Diagnosis not present

## 2019-03-15 NOTE — Progress Notes (Signed)
[facility] [place], [place] [address]  Internal MEDICINE  Telephone Visit  Patient Name: Rachael Blankenship  [id number]  [id number]  Date of Service: 03/15/2019  I connected with the patient at 1025 by video and verified the patients identity using two identifiers.   I discussed the limitations, risks, security and privacy concerns of performing an evaluation and management service by telephone and the availability of in person appointments. I also discussed with the patient that there may be a patient responsible charge related to the service.  The patient expressed understanding and agrees to proceed.    Chief Complaint  Patient presents with  . COPD    6 month follow up     HPI  PT being seen for 6 month pulmonary follow up on COPd, and chronic respiratory failure. She denies any recent hospitalizations.  Her breathing has been under control.  She does continue to smoke about 1/2 PPD of cigarettes. She is using her oxygen at night, and intermittently during the day. Denies Chest pain, Shortness of breath, palpitations, headache, or blurred vision. No fever, or hemoptysis noted.      Current Medication: Outpatient Encounter Medications as of 03/15/2019  Medication Sig  . alendronate (FOSAMAX) 70 MG tablet Take 70 mg by mouth once a week. Take with a full glass of water on an empty stomach.  . ALPRAZolam (XANAX) 0.25 MG tablet Take 1 tablet (0.25 mg total) by mouth 3 (three) times daily as needed for anxiety.  Marland Kitchen ascorbic acid (VITAMIN C) 500 MG tablet Take 500 mg by mouth daily.  Marland Kitchen aspirin 81 MG tablet Take 81 mg by mouth at bedtime.   Marland Kitchen buPROPion (WELLBUTRIN XL) 150 MG 24 hr tablet Take 1 tablet (150 mg total) by mouth daily.  . Cholecalciferol (VITAMIN D-1000 MAX ST) 1000 units tablet Take 1,000 Units by mouth daily.   . citalopram (CELEXA) 20 MG tablet Take 20 mg by mouth daily.  . Coenzyme Q10 (CO Q-10) 100 MG CAPS Take 200 mg by mouth daily.   .  furosemide (LASIX) 40 MG tablet TAKE 1 TABLET(40 MG) BY MOUTH DAILY  . ibuprofen (ADVIL,MOTRIN) 800 MG tablet Take 800 mg by mouth every 8 (eight) hours as needed for mild pain.   . Ipratropium-Albuterol (COMBIVENT RESPIMAT) 20-100 MCG/ACT AERS respimat Inhale 1 puff into the lungs every 6 (six) hours as needed for wheezing or shortness of breath.  Marland Kitchen ipratropium-albuterol (DUONEB) 0.5-2.5 (3) MG/3ML SOLN Use 1 vial via nebulizer every 4 t0 6 hrs  . Magnesium 400 MG TABS Take 400 mg by mouth daily.   . mometasone (NASONEX) 50 MCG/ACT nasal spray Place 2 sprays into the nose daily.  . mometasone-formoterol (DULERA) 200-5 MCG/ACT AERO Inhale 1 puff into the lungs 2 (two) times daily.  . montelukast (SINGULAIR) 10 MG tablet TAKE 1 TABLET BY MOUTH DAILY FOR RHINITIS OR ALLERGIES  . omeprazole (PRILOSEC) 40 MG capsule Take 1 capsule (40 mg total) by mouth 2 (two) times daily.  . OXYGEN Inhale into the lungs. Oxygen at night, 2 liters  . Potassium Chloride CR (MICRO-K) 8 MEQ CPCR capsule CR TAKE 1 CAPSULE BY MOUTH DAILY. MAY TAKE EXTRA 2-3 TIMES A WEEK  . predniSONE (STERAPRED UNI-PAK 21 TAB) 10 MG (21) TBPK tablet 6 day taper - take by mouth as directed for 6 days  . simvastatin (ZOCOR) 20 MG tablet Take 1 tablet (20 mg total) by mouth every evening. for cholesterol  . sucralfate (CARAFATE) 1  g tablet TAKE 1 TABLET(1 GRAM) BY MOUTH FOUR TIMES DAILY AT BEDTIME WITH MEALS  . tiotropium (SPIRIVA) 18 MCG inhalation capsule Place 1 capsule (18 mcg total) into inhaler and inhale daily.  . vitamin B-12 (CYANOCOBALAMIN) 1000 MCG tablet Take 1,000 mcg by mouth daily.  . [DISCONTINUED] ciprofloxacin (CIPRO) 500 MG tablet Take 1 tablet (500 mg total) by mouth 2 (two) times daily. (Patient not taking: Reported on 03/15/2019)   No facility-administered encounter medications on file as of 03/15/2019.     Surgical History: Past Surgical History:  Procedure Laterality Date  . ESOPHAGOGASTRODUODENOSCOPY N/A 07/29/2018    Procedure: ESOPHAGOGASTRODUODENOSCOPY (EGD);  Surgeon: Toledo, Benay Pike, MD;  Location: [facility] ENDOSCOPY;  Service: Gastroenterology;  Laterality: N/A;  . KIDNEY STONE SURGERY    . TONSILLECTOMY      Medical History: Past Medical History:  Diagnosis Date  . COPD (chronic obstructive pulmonary disease) ([place])   . Endometriosis   . GERD (gastroesophageal reflux disease)   . Hyperlipidemia   . Kidney stones     Family History: Family History  Problem Relation Age of Onset  . Hypertension Mother   . Hyperlipidemia Mother   . GER disease Mother   . Heart attack Father        23 yrs ago  . Hypertension Father   . Parkinson's disease Father   . GER disease Father     Social History   Socioeconomic History  . Marital status: Married    Spouse name: Not on file  . Number of children: Not on file  . Years of education: Not on file  . Highest education level: Not on file  Occupational History  . Not on file  Social Needs  . Financial resource strain: Not on file  . Food insecurity:    Worry: Not on file    Inability: Not on file  . Transportation needs:    Medical: Not on file    Non-medical: Not on file  Tobacco Use  . Smoking status: Current Every Day Smoker    Packs/day: 1.00    Types: Cigarettes    Start date: 11/09/2017  . Smokeless tobacco: Never Used  . Tobacco comment: pt cut back  smoking 1-2 daily  Substance and Sexual Activity  . Alcohol use: Yes    Comment: socially  . Drug use: No  . Sexual activity: Not on file  Lifestyle  . Physical activity:    Days per week: Not on file    Minutes per session: Not on file  . Stress: Not on file  Relationships  . Social connections:    Talks on phone: Not on file    Gets together: Not on file    Attends religious service: Not on file    Active member of club or organization: Not on file    Attends meetings of clubs or organizations: Not on file    Relationship status: Not on file  . Intimate partner  violence:    Fear of current or ex partner: Not on file    Emotionally abused: Not on file    Physically abused: Not on file    Forced sexual activity: Not on file  Other Topics Concern  . Not on file  Social History Narrative  . Not on file      Review of Systems  Constitutional: Negative for chills, fatigue and unexpected weight change.  HENT: Negative for congestion, rhinorrhea, sneezing and sore throat.   Eyes: Negative for photophobia, pain  and redness.  Respiratory: Negative for cough, chest tightness and shortness of breath.   Cardiovascular: Negative for chest pain and palpitations.  Gastrointestinal: Negative for abdominal pain, constipation, diarrhea, nausea and vomiting.  Endocrine: Negative.   Genitourinary: Negative for dysuria and frequency.  Musculoskeletal: Negative for arthralgias, back pain, joint swelling and neck pain.  Skin: Negative for rash.  Allergic/Immunologic: Negative.   Neurological: Negative for tremors and numbness.  Hematological: Negative for adenopathy. Does not bruise/bleed easily.  Psychiatric/Behavioral: Negative for behavioral problems and sleep disturbance. The patient is not nervous/anxious.     Vital Signs: BP 92/62   Pulse 72   Resp 16   Ht 5\' 4"  (1.626 m)   Wt 120 lb (54.4 kg)   BMI 20.60 kg/m    Observation/Objective: Pt appears well, NAD noted. She converses with ease, and speaking in full sentences.      Assessment/Plan: 1. Chronic obstructive pulmonary disease, unspecified COPD type ([place]) Stable, unfortunately she continues to smoke.  We once again discussed cessation.  Will continue inhalers as prescribed.   2. Chronic respiratory failure with hypoxia (HCC) Continue to use oxygen 2 LPM at night, and during the day as needed.   3. Nicotine dependence with nicotine-induced disorder, unspecified nicotine product type Smoking cessation counseling: 1. Pt acknowledges the risks of long term smoking, she will try to quite  smoking. 2. Options for different medications including nicotine products, chewing gum, patch etc, Wellbutrin and Chantix is discussed 3. Goal and date of compete cessation is discussed 4. Total time spent in smoking cessation is 15 min.   General Counseling: daelyn pettaway understanding of the findings of today's phone visit and agrees with plan of treatment. I have discussed any further diagnostic evaluation that may be needed or ordered today. We also reviewed her medications today. she has been encouraged to call the office with any questions or concerns that should arise related to todays visit.    No orders of the defined types were placed in this encounter.   No orders of the defined types were placed in this encounter.   Time spent: 20 Minutes  This patient was seen by Orson Gear AGNP-C in Collaboration with Dr. Devona Konig as a part of collaborative care agreement.   Orson Gear AGNP-C Internal medicine

## 2019-03-22 ENCOUNTER — Other Ambulatory Visit: Payer: Self-pay

## 2019-03-22 MED ORDER — SUCRALFATE 1 G PO TABS: ORAL_TABLET | ORAL | 0 refills | Status: DC

## 2019-03-29 ENCOUNTER — Other Ambulatory Visit: Payer: Self-pay

## 2019-03-29 ENCOUNTER — Other Ambulatory Visit: Payer: Self-pay | Admitting: Nurse Practitioner

## 2019-03-29 ENCOUNTER — Ambulatory Visit (INDEPENDENT_AMBULATORY_CARE_PROVIDER_SITE_OTHER): Payer: Medicare Other | Admitting: Nurse Practitioner

## 2019-03-29 ENCOUNTER — Encounter: Payer: Self-pay | Admitting: Nurse Practitioner

## 2019-03-29 VITALS — BP 132/81 | HR 79 | Resp 16 | Ht 64.0 in | Wt 122.0 lb

## 2019-03-29 DIAGNOSIS — Z13820 Encounter for screening for osteoporosis: Secondary | ICD-10-CM

## 2019-03-29 DIAGNOSIS — F411 Generalized anxiety disorder: Secondary | ICD-10-CM | POA: Diagnosis not present

## 2019-03-29 DIAGNOSIS — Z1239 Encounter for other screening for malignant neoplasm of breast: Secondary | ICD-10-CM | POA: Diagnosis not present

## 2019-03-29 DIAGNOSIS — J449 Chronic obstructive pulmonary disease, unspecified: Secondary | ICD-10-CM

## 2019-03-29 DIAGNOSIS — R300 Dysuria: Secondary | ICD-10-CM | POA: Diagnosis not present

## 2019-03-29 DIAGNOSIS — Z23 Encounter for immunization: Secondary | ICD-10-CM | POA: Insufficient documentation

## 2019-03-29 DIAGNOSIS — Z0001 Encounter for general adult medical examination with abnormal findings: Secondary | ICD-10-CM

## 2019-03-29 DIAGNOSIS — I10 Essential (primary) hypertension: Secondary | ICD-10-CM | POA: Diagnosis not present

## 2019-03-29 DIAGNOSIS — E559 Vitamin D deficiency, unspecified: Secondary | ICD-10-CM | POA: Diagnosis not present

## 2019-03-29 MED ORDER — CITALOPRAM HYDROBROMIDE 20 MG PO TABS: 20.0000 mg | ORAL_TABLET | Freq: Every day | ORAL | 3 refills | Status: DC

## 2019-03-29 MED ORDER — MOMETASONE FURO-FORMOTEROL FUM 200-5 MCG/ACT IN AERO: 1.0000 | INHALATION_SPRAY | Freq: Two times a day (BID) | RESPIRATORY_TRACT | 3 refills | Status: DC

## 2019-03-29 MED ORDER — ALPRAZOLAM 0.25 MG PO TABS: 0.2500 mg | ORAL_TABLET | Freq: Three times a day (TID) | ORAL | 2 refills | Status: DC | PRN

## 2019-03-29 NOTE — Progress Notes (Signed)
[facility] [place], [place] [address]  Internal MEDICINE  Office Visit Note  Patient Name: Rachael Blankenship  [id number]  [id number]  Date of Service: 03/29/2019   Pt is here for routine health maintenance examination   Chief Complaint  Patient presents with  . Medicare Wellness    medication refills, pt have concerns about bruising easily from elbows down to hand  . Gastroesophageal Reflux  . Hyperlipidemia  . COPD  . Edema    ankle swelling, cramping behind the knee and the calf muscle in the left leg, feels like something is trying to get out     The patient is here for annual health maintenance exam. She states that she is doing well. She is not having unusual shortness of breath.  Does need refill of her dulera. She states that she does have some mild swelling in her ankle and mild, intermittent cramping. She does not have varicose veins. She is due to have routine, fasting labs drawn. She is also due to have a bone density test and mammogram scheduled.    Current Medication: Outpatient Encounter Medications as of 03/29/2019  Medication Sig  . alendronate (FOSAMAX) 70 MG tablet Take 70 mg by mouth once a week. Take with a full glass of water on an empty stomach.  . ALPRAZolam (XANAX) 0.25 MG tablet Take 1 tablet (0.25 mg total) by mouth 3 (three) times daily as needed for anxiety.  Marland Kitchen ascorbic acid (VITAMIN C) 500 MG tablet Take 500 mg by mouth daily.  Marland Kitchen aspirin 81 MG tablet Take 81 mg by mouth at bedtime.   Marland Kitchen buPROPion (WELLBUTRIN XL) 150 MG 24 hr tablet Take 1 tablet (150 mg total) by mouth daily.  . Cholecalciferol (VITAMIN D-1000 MAX ST) 1000 units tablet Take 1,000 Units by mouth daily.   . citalopram (CELEXA) 20 MG tablet Take 1 tablet (20 mg total) by mouth daily.  . Coenzyme Q10 (CO Q-10) 100 MG CAPS Take 200 mg by mouth daily.   . furosemide (LASIX) 40 MG tablet TAKE 1 TABLET(40 MG) BY MOUTH DAILY  . ibuprofen (ADVIL,MOTRIN) 800 MG tablet  Take 800 mg by mouth every 8 (eight) hours as needed for mild pain.   . Ipratropium-Albuterol (COMBIVENT RESPIMAT) 20-100 MCG/ACT AERS respimat Inhale 1 puff into the lungs every 6 (six) hours as needed for wheezing or shortness of breath.  Marland Kitchen ipratropium-albuterol (DUONEB) 0.5-2.5 (3) MG/3ML SOLN Use 1 vial via nebulizer every 4 t0 6 hrs  . Magnesium 400 MG TABS Take 400 mg by mouth daily.   . mometasone (NASONEX) 50 MCG/ACT nasal spray Place 2 sprays into the nose daily.  . mometasone-formoterol (DULERA) 200-5 MCG/ACT AERO Inhale 1 puff into the lungs 2 (two) times daily.  . montelukast (SINGULAIR) 10 MG tablet TAKE 1 TABLET BY MOUTH DAILY FOR RHINITIS OR ALLERGIES  . omeprazole (PRILOSEC) 40 MG capsule Take 1 capsule (40 mg total) by mouth 2 (two) times daily.  . OXYGEN Inhale into the lungs. Oxygen at night, 2 liters  . Potassium Chloride CR (MICRO-K) 8 MEQ CPCR capsule CR TAKE 1 CAPSULE BY MOUTH DAILY. MAY TAKE EXTRA 2-3 TIMES A WEEK  . predniSONE (STERAPRED UNI-PAK 21 TAB) 10 MG (21) TBPK tablet 6 day taper - take by mouth as directed for 6 days  . simvastatin (ZOCOR) 20 MG tablet Take 1 tablet (20 mg total) by mouth every evening. for cholesterol  . sucralfate (CARAFATE) 1 g tablet TAKE 1 TABLET(1 GRAM)  BY MOUTH FOUR TIMES DAILY AT BEDTIME WITH MEALS  . tiotropium (SPIRIVA) 18 MCG inhalation capsule Place 1 capsule (18 mcg total) into inhaler and inhale daily.  . vitamin B-12 (CYANOCOBALAMIN) 1000 MCG tablet Take 1,000 mcg by mouth daily.  . [DISCONTINUED] ALPRAZolam (XANAX) 0.25 MG tablet Take 1 tablet (0.25 mg total) by mouth 3 (three) times daily as needed for anxiety.  . [DISCONTINUED] citalopram (CELEXA) 20 MG tablet Take 20 mg by mouth daily.  . [DISCONTINUED] mometasone-formoterol (DULERA) 200-5 MCG/ACT AERO Inhale 1 puff into the lungs 2 (two) times daily.   No facility-administered encounter medications on file as of 03/29/2019.     Surgical History: Past Surgical History:   Procedure Laterality Date  . ESOPHAGOGASTRODUODENOSCOPY N/A 07/29/2018   Procedure: ESOPHAGOGASTRODUODENOSCOPY (EGD);  Surgeon: Toledo, Benay Pike, MD;  Location: [facility] ENDOSCOPY;  Service: Gastroenterology;  Laterality: N/A;  . KIDNEY STONE SURGERY    . TONSILLECTOMY      Medical History: Past Medical History:  Diagnosis Date  . COPD (chronic obstructive pulmonary disease) ([place])   . Endometriosis   . GERD (gastroesophageal reflux disease)   . Hyperlipidemia   . Kidney stones     Family History: Family History  Problem Relation Age of Onset  . Hypertension Mother   . Hyperlipidemia Mother   . GER disease Mother   . Heart attack Father        23 yrs ago  . Hypertension Father   . Parkinson's disease Father   . GER disease Father       Review of Systems  Constitutional: Negative for chills, fatigue and unexpected weight change.  HENT: Negative for congestion, postnasal drip, rhinorrhea, sneezing and sore throat.   Respiratory: Negative for cough, chest tightness and shortness of breath.        Intermittent wheezing. generally well managed with current inhalers.   Cardiovascular: Negative for chest pain and palpitations.  Gastrointestinal: Negative for abdominal pain, constipation, diarrhea, nausea and vomiting.  Endocrine: Negative for cold intolerance, heat intolerance, polydipsia and polyuria.  Genitourinary: Negative for dysuria and frequency.  Musculoskeletal: Positive for myalgias. Negative for arthralgias, back pain, joint swelling and neck pain.       Left calf cramping intermittently.   Skin: Negative for rash.  Allergic/Immunologic: Positive for environmental allergies.  Neurological: Negative for dizziness, tremors, numbness and headaches.  Hematological: Negative for adenopathy. Does not bruise/bleed easily.  Psychiatric/Behavioral: Positive for dysphoric mood. Negative for behavioral problems (Depression), sleep disturbance and suicidal ideas. The patient is  nervous/anxious.     Today's Vitals   03/29/19 1353  BP: 132/81  Pulse: 79  Resp: 16  SpO2: 92%  Weight: 122 lb (55.3 kg)  Height: 5\' 4"  (1.626 m)   Body mass index is 20.94 kg/m.  Physical Exam Vitals signs and nursing note reviewed.  Constitutional:      General: She is not in acute distress.    Appearance: Normal appearance. She is well-developed. She is not diaphoretic.  HENT:     Head: Normocephalic and atraumatic.     Nose: Nose normal.     Mouth/Throat:     Pharynx: No oropharyngeal exudate.  Eyes:     Conjunctiva/sclera: Conjunctivae normal.     Pupils: Pupils are equal, round, and reactive to light.  Neck:     Musculoskeletal: Normal range of motion and neck supple.     Thyroid: No thyromegaly.     Vascular: No carotid bruit or JVD.     Trachea: No  tracheal deviation.  Cardiovascular:     Rate and Rhythm: Normal rate and regular rhythm.     Pulses: Normal pulses.     Heart sounds: Normal heart sounds. No murmur. No friction rub. No gallop.   Pulmonary:     Effort: Pulmonary effort is normal. No respiratory distress.     Breath sounds: Normal breath sounds. No wheezing or rales.     Comments: Soft wheezing auscultated, bilaterally.  Chest:     Chest wall: No tenderness.     Breasts:        Right: Normal. No swelling, bleeding, inverted nipple, mass, nipple discharge, skin change or tenderness.        Left: Normal. No swelling, bleeding, inverted nipple, mass, nipple discharge, skin change or tenderness.  Abdominal:     General: Bowel sounds are normal.     Palpations: Abdomen is soft.     Tenderness: There is no abdominal tenderness. There is no guarding.  Musculoskeletal: Normal range of motion.        General: No tenderness.  Lymphadenopathy:     Cervical: No cervical adenopathy.  Skin:    General: Skin is warm and dry.     Capillary Refill: Capillary refill takes less than 2 seconds.  Neurological:     General: No focal deficit present.     Mental  Status: She is alert and oriented to person, place, and time.     Cranial Nerves: No cranial nerve deficit.  Psychiatric:        Behavior: Behavior normal.        Thought Content: Thought content normal.        Judgment: Judgment normal.    Depression screen [facility] 2/9 03/15/2019 11/06/2018 06/30/2018 04/24/2018 11/10/2017  Decreased Interest 0 0 1 2 2   Down, Depressed, Hopeless 0 0 1 0 0  PHQ - 2 Score 0 0 2 2 2   Altered sleeping - - 0 0 -  Tired, decreased energy - - 1 3 -  Change in appetite - - 1 0 -  Feeling bad or failure about yourself  - - 1 0 -  Trouble concentrating - - 0 0 -  Moving slowly or fidgety/restless - - 0 0 -  Suicidal thoughts - - 0 0 -  PHQ-9 Score - - 5 5 -    Functional Status Survey: Is the patient deaf or have difficulty hearing?: No Does the patient have difficulty seeing, even when wearing glasses/contacts?: No Does the patient have difficulty concentrating, remembering, or making decisions?: Yes(remembering) Does the patient have difficulty walking or climbing stairs?: Yes(climbing, pt gets out of breath) Does the patient have difficulty dressing or bathing?: No Does the patient have difficulty doing errands alone such as visiting a doctor's office or shopping?: No  MMSE - [place] Exam 03/29/2019  Orientation to time 5  Orientation to Place 5  Registration 3  Attention/ Calculation 5  Recall 3  Language- name 2 objects 2  Language- repeat 1  Language- follow 3 step command 3  Language- read & follow direction 1  Write a sentence 1  Copy design 1  Total score 30    Fall Risk  03/29/2019 03/15/2019 11/26/2018 11/06/2018 06/30/2018  Falls in the past year? 0 0 0 0 No  Number falls in past yr: - - - - -  Injury with Fall? - - - - -  Risk for fall due to : - - - - -  Assessment/Plan:  1. Encounter for general adult medical examination with abnormal findings Annual health maintenance exam today.  2. Chronic obstructive pulmonary disease,  unspecified COPD type ([place]) Stable. Continue inhalers and respiratory medications as needed and as prescribed.  - mometasone-formoterol (DULERA) 200-5 MCG/ACT AERO; Inhale 1 puff into the lungs 2 (two) times daily.  Dispense: 3 Inhaler; Refill: 3  3. Generalized anxiety disorder Continue citalopram 20mg  daily. May use alprazolam 0.25mg  up to three times daily if needed for acute anxiety. New prescriptions provided today. - citalopram (CELEXA) 20 MG tablet; Take 1 tablet (20 mg total) by mouth daily.  Dispense: 90 tablet; Refill: 3 - ALPRAZolam (XANAX) 0.25 MG tablet; Take 1 tablet (0.25 mg total) by mouth 3 (three) times daily as needed for anxiety.  Dispense: 90 tablet; Refill: 2  4. Screening for breast cancer Mammogram to be ordered  5. Screening for osteoporosis Bone density test to be ordered.   6. Need for vaccination against Streptococcus pneumoniae using pneumococcal conjugate vaccine 13 Prescription for prevnar 13 sent to her pharmacy for administration.  - Pneumococcal conjugate vaccine 13-valent IM  7. Dysuria - UA/M w/rflx Culture, Routine  General Counseling: Shawntay verbalizes understanding of the findings of todays visit and agrees with plan of treatment. I have discussed any further diagnostic evaluation that may be needed or ordered today. We also reviewed her medications today. she has been encouraged to call the office with any questions or concerns that should arise related to todays visit.    Counseling:  This patient was seen by Leretha Pol FNP Collaboration with Dr Lavera Guise as a part of collaborative care agreement  Orders Placed This Encounter  Procedures  . Pneumococcal conjugate vaccine 13-valent IM  . UA/M w/rflx Culture, Routine    Meds ordered this encounter  Medications  . citalopram (CELEXA) 20 MG tablet    Sig: Take 1 tablet (20 mg total) by mouth daily.    Dispense:  90 tablet    Refill:  3    Order Specific Question:   Supervising  Provider    Answer:   Lavera Guise [[id number]]  . mometasone-formoterol (DULERA) 200-5 MCG/ACT AERO    Sig: Inhale 1 puff into the lungs 2 (two) times daily.    Dispense:  3 Inhaler    Refill:  3    Please fill as 90 day prescription.    Order Specific Question:   Supervising Provider    Answer:   Lavera Guise [[id number]]  . ALPRAZolam (XANAX) 0.25 MG tablet    Sig: Take 1 tablet (0.25 mg total) by mouth 3 (three) times daily as needed for anxiety.    Dispense:  90 tablet    Refill:  2    Order Specific Question:   Supervising Provider    Answer:   Lavera Guise [[id number]]    Time spent: [place], MD  Internal Medicine

## 2019-03-30 ENCOUNTER — Other Ambulatory Visit: Payer: Self-pay | Admitting: Internal Medicine

## 2019-03-30 DIAGNOSIS — E876 Hypokalemia: Secondary | ICD-10-CM

## 2019-03-30 LAB — COMPREHENSIVE METABOLIC PANEL
ALT: 25 IU/L (ref 0–32)
AST: 30 IU/L (ref 0–40)
Albumin/Globulin Ratio: 2.2 (ref 1.2–2.2)
Albumin: 4.4 g/dL (ref 3.8–4.8)
Alkaline Phosphatase: 101 IU/L (ref 39–117)
BUN/Creatinine Ratio: 25 (ref 12–28)
BUN: 15 mg/dL (ref 8–27)
Bilirubin Total: 0.5 mg/dL (ref 0.0–1.2)
CO2: 26 mmol/L (ref 20–29)
Calcium: 9.4 mg/dL (ref 8.7–10.3)
Chloride: 98 mmol/L (ref 96–106)
Creatinine, Ser: 0.61 mg/dL (ref 0.57–1.00)
GFR calc Af Amer: 110 mL/min/{1.73_m2} (ref >59)
GFR calc non Af Amer: 95 mL/min/{1.73_m2} (ref >59)
Globulin, Total: 2 g/dL (ref 1.5–4.5)
Glucose: 100 mg/dL — ABNORMAL HIGH (ref 65–99)
Potassium: 3.4 mmol/L — ABNORMAL LOW (ref 3.5–5.2)
Sodium: 142 mmol/L (ref 134–144)
Total Protein: 6.4 g/dL (ref 6.0–8.5)

## 2019-03-30 LAB — MICROSCOPIC EXAMINATION
Bacteria, UA: NONE SEEN
Casts: NONE SEEN /lpf
Epithelial Cells (non renal): NONE SEEN /hpf (ref 0–10)
WBC, UA: NONE SEEN /hpf (ref 0–5)

## 2019-03-30 LAB — LIPID PANEL W/O CHOL/HDL RATIO
Cholesterol, Total: 173 mg/dL (ref 100–199)
HDL: 71 mg/dL (ref >39)
LDL Calculated: 89 mg/dL (ref 0–99)
Triglycerides: 65 mg/dL (ref 0–149)
VLDL Cholesterol Cal: 13 mg/dL (ref 5–40)

## 2019-03-30 LAB — UA/M W/RFLX CULTURE, ROUTINE
Bilirubin, UA: NEGATIVE
Glucose, UA: NEGATIVE
Ketones, UA: NEGATIVE
Leukocytes,UA: NEGATIVE
Nitrite, UA: NEGATIVE
Protein,UA: NEGATIVE
RBC, UA: NEGATIVE
Specific Gravity, UA: 1.009 (ref 1.005–1.030)
Urobilinogen, Ur: 0.2 mg/dL (ref 0.2–1.0)
pH, UA: 6 (ref 5.0–7.5)

## 2019-03-30 LAB — CBC
Hematocrit: 43.1 % (ref 34.0–46.6)
Hemoglobin: 14.9 g/dL (ref 11.1–15.9)
MCH: 28.1 pg (ref 26.6–33.0)
MCHC: 34.6 g/dL (ref 31.5–35.7)
MCV: 81 fL (ref 79–97)
Platelets: 328 10*3/uL (ref 150–450)
RBC: 5.3 x10E6/uL — ABNORMAL HIGH (ref 3.77–5.28)
RDW: 13.8 % (ref 11.7–15.4)
WBC: 9.1 10*3/uL (ref 3.4–10.8)

## 2019-03-30 LAB — T3: T3, Total: 130 ng/dL (ref 71–180)

## 2019-03-30 LAB — VITAMIN D 25 HYDROXY (VIT D DEFICIENCY, FRACTURES): Vit D, 25-Hydroxy: 55.4 ng/mL (ref 30.0–100.0)

## 2019-03-30 LAB — T4, FREE: Free T4: 1.17 ng/dL (ref 0.82–1.77)

## 2019-03-30 LAB — TSH: TSH: 0.992 u[IU]/mL (ref 0.450–4.500)

## 2019-03-30 NOTE — Progress Notes (Signed)
.  basi

## 2019-03-31 ENCOUNTER — Telehealth: Payer: Self-pay

## 2019-03-31 NOTE — Telephone Encounter (Signed)
Informed pt of lab results and new directions for medication per DFK. Also informed pt to retest in about a month.

## 2019-04-08 ENCOUNTER — Telehealth: Payer: Self-pay

## 2019-04-08 NOTE — Telephone Encounter (Signed)
Gave [facility] orders for RX for overnight oximetry test. Rachael Blankenship

## 2019-04-13 ENCOUNTER — Telehealth: Payer: Self-pay

## 2019-04-13 NOTE — Telephone Encounter (Signed)
Faxed signed cmn for night O2 with patient overnight oximetry test results abd put copy in scan. Beth

## 2019-04-20 ENCOUNTER — Other Ambulatory Visit: Payer: Self-pay

## 2019-04-20 MED ORDER — SUCRALFATE 1 G PO TABS: ORAL_TABLET | ORAL | 0 refills | Status: DC

## 2019-04-21 ENCOUNTER — Encounter: Payer: Self-pay | Admitting: Adult Health

## 2019-04-26 ENCOUNTER — Other Ambulatory Visit: Payer: Self-pay

## 2019-04-26 DIAGNOSIS — K219 Gastro-esophageal reflux disease without esophagitis: Secondary | ICD-10-CM

## 2019-04-26 MED ORDER — OMEPRAZOLE 40 MG PO CPDR: 40.0000 mg | DELAYED_RELEASE_CAPSULE | Freq: Two times a day (BID) | ORAL | 3 refills | Status: DC

## 2019-04-26 MED ORDER — SIMVASTATIN 20 MG PO TABS: 20.0000 mg | ORAL_TABLET | Freq: Every evening | ORAL | 1 refills | Status: DC

## 2019-05-06 ENCOUNTER — Ambulatory Visit (INDEPENDENT_AMBULATORY_CARE_PROVIDER_SITE_OTHER): Payer: Medicare Other | Admitting: Internal Medicine

## 2019-05-06 ENCOUNTER — Other Ambulatory Visit: Payer: Self-pay

## 2019-05-06 ENCOUNTER — Encounter: Payer: Self-pay | Admitting: Internal Medicine

## 2019-05-06 VITALS — BP 122/72 | HR 69 | Resp 16 | Ht 64.0 in | Wt 124.0 lb

## 2019-05-06 DIAGNOSIS — F17209 Nicotine dependence, unspecified, with unspecified nicotine-induced disorders: Secondary | ICD-10-CM | POA: Diagnosis not present

## 2019-05-06 DIAGNOSIS — R0602 Shortness of breath: Secondary | ICD-10-CM | POA: Diagnosis not present

## 2019-05-06 DIAGNOSIS — J449 Chronic obstructive pulmonary disease, unspecified: Secondary | ICD-10-CM | POA: Diagnosis not present

## 2019-05-06 DIAGNOSIS — J9611 Chronic respiratory failure with hypoxia: Secondary | ICD-10-CM

## 2019-05-06 NOTE — Progress Notes (Signed)
[facility] [place],  [address]  Pulmonary Sleep Medicine   Office Visit Note  Patient Name: Rachael Blankenship DOB: Dec 15, 1955 MRN [id number]  Date of Service: 05/06/2019  Complaints/HPI: Pt is here for follow up.  She was on oxygen before, but her insurance changed and now she has to start over for qualification.  Her 6 minute walk today shows she dropped to 88% with walking, and once she was placed on 2 liters she was back up in the upper 90'S, even with walking.  She denies any overt sob, palpiations or chest pain.  She gets excellent symptom relief when on oxygen.   ROS  General: (-) fever, (-) chills, (-) night sweats, (-) weakness Skin: (-) rashes, (-) itching,. Eyes: (-) visual changes, (-) redness, (-) itching. Nose and Sinuses: (-) nasal stuffiness or itchiness, (-) postnasal drip, (-) nosebleeds, (-) sinus trouble. Mouth and Throat: (-) sore throat, (-) hoarseness. Neck: (-) swollen glands, (-) enlarged thyroid, (-) neck pain. Respiratory: - cough, (-) bloody sputum, + shortness of breath, - wheezing. Cardiovascular: - ankle swelling, (-) chest pain. Lymphatic: (-) lymph node enlargement. Neurologic: (-) numbness, (-) tingling. Psychiatric: (-) anxiety, (-) depression   Current Medication: Outpatient Encounter Medications as of 05/06/2019  Medication Sig  . alendronate (FOSAMAX) 70 MG tablet Take 70 mg by mouth once a week. Take with a full glass of water on an empty stomach.  . ALPRAZolam (XANAX) 0.25 MG tablet Take 1 tablet (0.25 mg total) by mouth 3 (three) times daily as needed for anxiety.  Marland Kitchen ascorbic acid (VITAMIN C) 500 MG tablet Take 500 mg by mouth daily.  Marland Kitchen aspirin 81 MG tablet Take 81 mg by mouth at bedtime.   Marland Kitchen buPROPion (WELLBUTRIN XL) 150 MG 24 hr tablet Take 1 tablet (150 mg total) by mouth daily.  . Cholecalciferol (VITAMIN D-1000 MAX ST) 1000 units tablet Take 1,000 Units by mouth daily.   . citalopram (CELEXA) 20 MG  tablet Take 1 tablet (20 mg total) by mouth daily.  . Coenzyme Q10 (CO Q-10) 100 MG CAPS Take 200 mg by mouth daily.   . furosemide (LASIX) 40 MG tablet TAKE 1 TABLET(40 MG) BY MOUTH DAILY  . ibuprofen (ADVIL,MOTRIN) 800 MG tablet Take 800 mg by mouth every 8 (eight) hours as needed for mild pain.   . Ipratropium-Albuterol (COMBIVENT RESPIMAT) 20-100 MCG/ACT AERS respimat Inhale 1 puff into the lungs every 6 (six) hours as needed for wheezing or shortness of breath.  Marland Kitchen ipratropium-albuterol (DUONEB) 0.5-2.5 (3) MG/3ML SOLN Use 1 vial via nebulizer every 4 t0 6 hrs  . Magnesium 400 MG TABS Take 400 mg by mouth daily.   . mometasone (NASONEX) 50 MCG/ACT nasal spray Place 2 sprays into the nose daily.  . mometasone-formoterol (DULERA) 200-5 MCG/ACT AERO Inhale 1 puff into the lungs 2 (two) times daily.  . montelukast (SINGULAIR) 10 MG tablet TAKE 1 TABLET BY MOUTH DAILY FOR RHINITIS OR ALLERGIES  . omeprazole (PRILOSEC) 40 MG capsule Take 1 capsule (40 mg total) by mouth 2 (two) times daily.  . OXYGEN Inhale into the lungs. Oxygen at night, 2 liters  . Potassium Chloride CR (MICRO-K) 8 MEQ CPCR capsule CR TAKE 1 CAPSULE BY MOUTH DAILY. MAY TAKE EXTRA 2-3 TIMES A WEEK  . predniSONE (STERAPRED UNI-PAK 21 TAB) 10 MG (21) TBPK tablet 6 day taper - take by mouth as directed for 6 days  . simvastatin (ZOCOR) 20 MG tablet Take 1 tablet (20  mg total) by mouth every evening. for cholesterol  . sucralfate (CARAFATE) 1 g tablet TAKE 1 TABLET(1 GRAM) BY MOUTH FOUR TIMES DAILY AT BEDTIME WITH MEALS  . tiotropium (SPIRIVA) 18 MCG inhalation capsule Place 1 capsule (18 mcg total) into inhaler and inhale daily.  . vitamin B-12 (CYANOCOBALAMIN) 1000 MCG tablet Take 1,000 mcg by mouth daily.   No facility-administered encounter medications on file as of 05/06/2019.     Surgical History: Past Surgical History:  Procedure Laterality Date  . ESOPHAGOGASTRODUODENOSCOPY N/A 07/29/2018   Procedure:  ESOPHAGOGASTRODUODENOSCOPY (EGD);  Surgeon: Toledo, Benay Pike, MD;  Location: [facility] ENDOSCOPY;  Service: Gastroenterology;  Laterality: N/A;  . KIDNEY STONE SURGERY    . TONSILLECTOMY      Medical History: Past Medical History:  Diagnosis Date  . COPD (chronic obstructive pulmonary disease) ([place])   . Endometriosis   . GERD (gastroesophageal reflux disease)   . Hyperlipidemia   . Kidney stones     Family History: Family History  Problem Relation Age of Onset  . Hypertension Mother   . Hyperlipidemia Mother   . GER disease Mother   . Heart attack Father        23 yrs ago  . Hypertension Father   . Parkinson's disease Father   . GER disease Father     Social History: Social History   Socioeconomic History  . Marital status: Married    Spouse name: Not on file  . Number of children: Not on file  . Years of education: Not on file  . Highest education level: Not on file  Occupational History  . Not on file  Social Needs  . Financial resource strain: Not on file  . Food insecurity    Worry: Not on file    Inability: Not on file  . Transportation needs    Medical: Not on file    Non-medical: Not on file  Tobacco Use  . Smoking status: Current Every Day Smoker    Packs/day: 1.00    Types: Cigarettes    Start date: 11/09/2017  . Smokeless tobacco: Never Used  . Tobacco comment: pt cut back  smoking 1-2 daily  Substance and Sexual Activity  . Alcohol use: Not Currently    Comment: not recently  . Drug use: No  . Sexual activity: Not on file  Lifestyle  . Physical activity    Days per week: Not on file    Minutes per session: Not on file  . Stress: Not on file  Relationships  . Social Herbalist on phone: Not on file    Gets together: Not on file    Attends religious service: Not on file    Active member of club or organization: Not on file    Attends meetings of clubs or organizations: Not on file    Relationship status: Not on file  . Intimate  partner violence    Fear of current or ex partner: Not on file    Emotionally abused: Not on file    Physically abused: Not on file    Forced sexual activity: Not on file  Other Topics Concern  . Not on file  Social History Narrative  . Not on file    Vital Signs: Blood pressure 122/72, pulse 69, resp. rate 16, height 5\' 4"  (1.626 m), weight 124 lb (56.2 kg), SpO2 90 %.  Examination: General Appearance: The patient is well-developed, well-nourished, and in no distress. Skin: Gross inspection of skin  unremarkable. Head: normocephalic, no gross deformities. Eyes: no gross deformities noted. ENT: ears appear grossly normal no exudates. Neck: Supple. No thyromegaly. No LAD. Respiratory: clear bilaterally. Cardiovascular: Normal S1 and S2 without murmur or rub. Extremities: No cyanosis. pulses are equal. Neurologic: Alert and oriented. No involuntary movements.  LABS: Recent Results (from the past 2160 hour(s))  Comprehensive metabolic panel     Status: Abnormal   Collection Time: 03/29/19  3:06 PM  Result Value Ref Range   Glucose 100 (H) 65 - 99 mg/dL   BUN 15 8 - 27 mg/dL   Creatinine, Ser 0.61 0.57 - 1.00 mg/dL   GFR calc non Af Amer 95 >59 mL/min/1.73   GFR calc Af Amer 110 >59 mL/min/1.73   BUN/Creatinine Ratio 25 12 - 28   Sodium 142 134 - 144 mmol/L   Potassium 3.4 (L) 3.5 - 5.2 mmol/L   Chloride 98 96 - 106 mmol/L   CO2 26 20 - 29 mmol/L   Calcium 9.4 8.7 - 10.3 mg/dL   Total Protein 6.4 6.0 - 8.5 g/dL   Albumin 4.4 3.8 - 4.8 g/dL   Globulin, Total 2.0 1.5 - 4.5 g/dL   Albumin/Globulin Ratio 2.2 1.2 - 2.2   Bilirubin Total 0.5 0.0 - 1.2 mg/dL   Alkaline Phosphatase 101 39 - 117 IU/L   AST 30 0 - 40 IU/L   ALT 25 0 - 32 IU/L  CBC     Status: Abnormal   Collection Time: 03/29/19  3:06 PM  Result Value Ref Range   WBC 9.1 3.4 - 10.8 x10E3/uL   RBC 5.30 (H) 3.77 - 5.28 x10E6/uL   Hemoglobin 14.9 11.1 - 15.9 g/dL   Hematocrit 43.1 34.0 - 46.6 %   MCV 81 79 - 97  fL   MCH 28.1 26.6 - 33.0 pg   MCHC 34.6 31.5 - 35.7 g/dL   RDW 13.8 11.7 - 15.4 %   Platelets 328 150 - 450 x10E3/uL  Lipid Panel w/o Chol/HDL Ratio     Status: None   Collection Time: 03/29/19  3:06 PM  Result Value Ref Range   Cholesterol, Total 173 100 - 199 mg/dL   Triglycerides 65 0 - 149 mg/dL   HDL 71 >39 mg/dL   VLDL Cholesterol Cal 13 5 - 40 mg/dL   LDL Calculated 89 0 - 99 mg/dL  T4, free     Status: None   Collection Time: 03/29/19  3:06 PM  Result Value Ref Range   Free T4 1.17 0.82 - 1.77 ng/dL  TSH     Status: None   Collection Time: 03/29/19  3:06 PM  Result Value Ref Range   TSH 0.992 0.450 - 4.500 uIU/mL  VITAMIN D 25 Hydroxy (Vit-D Deficiency, Fractures)     Status: None   Collection Time: 03/29/19  3:06 PM  Result Value Ref Range   Vit D, 25-Hydroxy 55.4 30.0 - 100.0 ng/mL    Comment: Vitamin D deficiency has been defined by the Institute of Medicine and an Endocrine Society practice guideline as a level of serum 25-OH vitamin D less than 20 ng/mL (1,2). The Endocrine Society went on to further define vitamin D insufficiency as a level between 21 and 29 ng/mL (2). 1. IOM (Institute of Medicine). 2010. Dietary reference    intakes for calcium and D. [place]: The    [employer]. 2. Holick MF, [place], Bischoff-[place]ari HA, et al.    Evaluation, treatment, and prevention of vitamin D  deficiency: an Endocrine Society clinical practice    guideline. JCEM. 2011 Jul; 96(7):1911-30.   T3     Status: None   Collection Time: 03/29/19  3:06 PM  Result Value Ref Range   T3, Total 130 71 - 180 ng/dL  UA/M w/rflx Culture, Routine     Status: None   Collection Time: 03/29/19  3:13 PM   Specimen: Urine   URINE  Result Value Ref Range   Specific Gravity, UA 1.009 1.005 - 1.030   pH, UA 6.0 5.0 - 7.5   Color, UA Yellow Yellow   Appearance Ur Clear Clear   Leukocytes,UA Negative Negative   Protein,UA Negative Negative/Trace   Glucose, UA  Negative Negative   Ketones, UA Negative Negative   RBC, UA Negative Negative   Bilirubin, UA Negative Negative   Urobilinogen, Ur 0.2 0.2 - 1.0 mg/dL   Nitrite, UA Negative Negative   Microscopic Examination Comment     Comment: Microscopic follows if indicated.   Microscopic Examination See below:     Comment: Microscopic was indicated and was performed.   Urinalysis Reflex Comment     Comment: This specimen will not reflex to a Urine Culture.  Microscopic Examination     Status: None   Collection Time: 03/29/19  3:13 PM   URINE  Result Value Ref Range   WBC, UA None seen 0 - 5 /hpf   RBC 0-2 0 - 2 /hpf   Epithelial Cells (non renal) None seen 0 - 10 /hpf   Casts None seen None seen /lpf   Bacteria, UA None seen None seen/Few    Radiology: Nm Hepato W/eject Fract  Result Date: 08/19/2018 CLINICAL DATA:  Epigastric and upper abdominal pain EXAM: NUCLEAR MEDICINE HEPATOBILIARY IMAGING WITH GALLBLADDER EF VIEWS: Anterior, right lateral right upper quadrant RADIOPHARMACEUTICALS:  5.693 mCi Tc-25m  Choletec IV COMPARISON:  None. FINDINGS: Liver uptake of radiotracer is unremarkable. There is prompt visualization of gallbladder and small bowel, indicating patency of the cystic and common bile ducts. The patient consumed 8 ounces of Ensure orally with calculation of the computer generated ejection fraction of radiotracer from the gallbladder. The patient did not report clinical symptoms with the oral Ensure consumption. The computer generated ejection fraction of radiotracer from the gallbladder is normal at 54%, normal greater than 33% using the oral agent. IMPRESSION: Study within normal limits. Electronically Signed   By: Lowella Grip III M.D.   On: 08/19/2018 13:59    No results found.  No results found.    Assessment and Plan: Patient Active Problem List   Diagnosis Date Noted  . Need for vaccination against Streptococcus pneumoniae using pneumococcal conjugate vaccine 13  03/29/2019  . Cigarette smoker 11/26/2018  . Chronic obstructive pulmonary disease ([place]) 08/25/2018  . Gastroesophageal reflux disease without esophagitis 08/25/2018  . Black stool 08/25/2018  . Flu vaccine need 08/25/2018  . Acute respiratory failure with hypoxemia ([place]) 05/21/2018  . Screening for osteoporosis 05/13/2018  . Acute upper respiratory infection 05/13/2018  . Other fatigue 05/13/2018  . Generalized anxiety disorder 05/13/2018  . Vitamin D deficiency 05/13/2018  . Screening for malignant neoplasm of cervix 05/13/2018  . Dysuria 05/13/2018  . Acute chest pain 01/18/2015  . SOB (shortness of breath) 01/18/2015  . Hyperlipemia 03/08/2014  . Pulmonary HTN ([place]) 03/08/2014  . Reactive airway disease 03/08/2014    1. Chronic obstructive pulmonary disease, unspecified COPD type ([place]) Once oxygen is delivered, wear oxygen 2lpm during the day and continue nocturnal  use.    2. Chronic respiratory failure with hypoxia (HCC) Pt requiring oxygen at this time based on new 6 minute walk.  Will order oxygen for home use.   3. Nicotine dependence with nicotine-induced disorder, unspecified nicotine product type Smoking cessation counseling: 1. Pt acknowledges the risks of long term smoking, she will try to quite smoking. 2. Options for different medications including nicotine products, chewing gum, patch etc, Wellbutrin and Chantix is discussed 3. Goal and date of compete cessation is discussed 4. Total time spent in smoking cessation is 15 min.   4. SOB (shortness of breath) FEV1 0.7 which is 29% of pre-predicted value - Spirometry with Graph - 6 minute walk  General Counseling: I have discussed the findings of the evaluation and examination with Neoma Laming.  I have also discussed any further diagnostic evaluation thatmay be needed or ordered today. Felicite verbalizes understanding of the findings of todays visit. We also reviewed her medications today and discussed drug  interactions and side effects including but not limited excessive drowsiness and altered mental states. We also discussed that there is always a risk not just to her but also people around her. she has been encouraged to call the office with any questions or concerns that should arise related to todays visit.    Time spent: 25 This patient was seen by Orson Gear AGNP-C in Collaboration with Dr. Devona Konig as a part of collaborative care agreement.   I have personally obtained a history, examined the patient, evaluated laboratory and imaging results, formulated the assessment and plan and placed orders.    Allyne Gee, MD [facility] Pulmonary and Critical Care Sleep medicine

## 2019-05-27 ENCOUNTER — Other Ambulatory Visit: Payer: Self-pay | Admitting: Internal Medicine

## 2019-06-11 ENCOUNTER — Other Ambulatory Visit: Payer: Self-pay

## 2019-06-11 MED ORDER — TIOTROPIUM BROMIDE MONOHYDRATE 18 MCG IN CAPS: 18.0000 ug | ORAL_CAPSULE | Freq: Every day | RESPIRATORY_TRACT | 5 refills | Status: DC

## 2019-06-23 ENCOUNTER — Telehealth: Payer: Self-pay

## 2019-06-23 NOTE — Telephone Encounter (Signed)
CMN SIGNED AND PLACED IN [employer] PAT[employer] FOLDER. °

## 2019-06-25 ENCOUNTER — Other Ambulatory Visit: Payer: Self-pay

## 2019-06-25 ENCOUNTER — Ambulatory Visit: Payer: Self-pay | Admitting: Nurse Practitioner

## 2019-06-25 DIAGNOSIS — E876 Hypokalemia: Secondary | ICD-10-CM

## 2019-06-29 ENCOUNTER — Ambulatory Visit: Payer: Medicare Other | Admitting: Nurse Practitioner

## 2019-07-03 ENCOUNTER — Other Ambulatory Visit: Payer: Self-pay | Admitting: Internal Medicine

## 2019-07-03 DIAGNOSIS — J449 Chronic obstructive pulmonary disease, unspecified: Secondary | ICD-10-CM

## 2019-07-08 DIAGNOSIS — Z85828 Personal history of other malignant neoplasm of skin: Secondary | ICD-10-CM | POA: Diagnosis not present

## 2019-07-08 DIAGNOSIS — D225 Melanocytic nevi of trunk: Secondary | ICD-10-CM | POA: Diagnosis not present

## 2019-07-08 DIAGNOSIS — D485 Neoplasm of uncertain behavior of skin: Secondary | ICD-10-CM | POA: Diagnosis not present

## 2019-07-08 DIAGNOSIS — D2272 Melanocytic nevi of left lower limb, including hip: Secondary | ICD-10-CM | POA: Diagnosis not present

## 2019-07-08 DIAGNOSIS — Z08 Encounter for follow-up examination after completed treatment for malignant neoplasm: Secondary | ICD-10-CM | POA: Diagnosis not present

## 2019-07-08 DIAGNOSIS — L570 Actinic keratosis: Secondary | ICD-10-CM | POA: Diagnosis not present

## 2019-07-08 DIAGNOSIS — D2271 Melanocytic nevi of right lower limb, including hip: Secondary | ICD-10-CM | POA: Diagnosis not present

## 2019-07-08 DIAGNOSIS — D2261 Melanocytic nevi of right upper limb, including shoulder: Secondary | ICD-10-CM | POA: Diagnosis not present

## 2019-07-08 DIAGNOSIS — D2262 Melanocytic nevi of left upper limb, including shoulder: Secondary | ICD-10-CM | POA: Diagnosis not present

## 2019-07-08 DIAGNOSIS — Z85820 Personal history of malignant melanoma of skin: Secondary | ICD-10-CM | POA: Diagnosis not present

## 2019-07-08 DIAGNOSIS — L821 Other seborrheic keratosis: Secondary | ICD-10-CM | POA: Diagnosis not present

## 2019-07-19 ENCOUNTER — Other Ambulatory Visit: Payer: Self-pay

## 2019-07-19 ENCOUNTER — Ambulatory Visit: Payer: Medicare Other | Admitting: Internal Medicine

## 2019-07-19 MED ORDER — SUCRALFATE 1 G PO TABS: ORAL_TABLET | ORAL | 0 refills | Status: DC

## 2019-07-21 ENCOUNTER — Other Ambulatory Visit: Payer: Self-pay | Admitting: Adult Health

## 2019-07-21 MED ORDER — FUROSEMIDE 40 MG PO TABS: ORAL_TABLET | ORAL | 5 refills | Status: DC

## 2019-07-22 ENCOUNTER — Ambulatory Visit: Payer: Medicare Other | Admitting: Internal Medicine

## 2019-07-22 ENCOUNTER — Other Ambulatory Visit: Payer: Self-pay

## 2019-07-22 DIAGNOSIS — L821 Other seborrheic keratosis: Secondary | ICD-10-CM | POA: Diagnosis not present

## 2019-07-22 DIAGNOSIS — L570 Actinic keratosis: Secondary | ICD-10-CM | POA: Diagnosis not present

## 2019-07-22 DIAGNOSIS — D485 Neoplasm of uncertain behavior of skin: Secondary | ICD-10-CM | POA: Diagnosis not present

## 2019-07-22 MED ORDER — POTASSIUM CHLORIDE ER 8 MEQ PO CPCR: ORAL_CAPSULE | ORAL | 3 refills | Status: DC

## 2019-07-23 ENCOUNTER — Other Ambulatory Visit: Payer: Self-pay

## 2019-07-23 DIAGNOSIS — K219 Gastro-esophageal reflux disease without esophagitis: Secondary | ICD-10-CM

## 2019-07-23 MED ORDER — OMEPRAZOLE 40 MG PO CPDR: 40.0000 mg | DELAYED_RELEASE_CAPSULE | Freq: Two times a day (BID) | ORAL | 3 refills | Status: DC

## 2019-07-26 ENCOUNTER — Other Ambulatory Visit: Payer: Self-pay

## 2019-07-26 DIAGNOSIS — K219 Gastro-esophageal reflux disease without esophagitis: Secondary | ICD-10-CM

## 2019-07-26 MED ORDER — OMEPRAZOLE 40 MG PO CPDR: 40.0000 mg | DELAYED_RELEASE_CAPSULE | Freq: Two times a day (BID) | ORAL | 3 refills | Status: DC

## 2019-08-03 ENCOUNTER — Other Ambulatory Visit: Payer: Self-pay

## 2019-08-03 ENCOUNTER — Encounter: Payer: Self-pay | Admitting: Internal Medicine

## 2019-08-03 ENCOUNTER — Ambulatory Visit (INDEPENDENT_AMBULATORY_CARE_PROVIDER_SITE_OTHER): Payer: Medicare Other | Admitting: Internal Medicine

## 2019-08-03 VITALS — BP 116/64 | HR 80 | Resp 16 | Ht 64.0 in | Wt 126.0 lb

## 2019-08-03 DIAGNOSIS — I272 Pulmonary hypertension, unspecified: Secondary | ICD-10-CM | POA: Diagnosis not present

## 2019-08-03 DIAGNOSIS — R0602 Shortness of breath: Secondary | ICD-10-CM

## 2019-08-03 DIAGNOSIS — K219 Gastro-esophageal reflux disease without esophagitis: Secondary | ICD-10-CM

## 2019-08-03 DIAGNOSIS — J449 Chronic obstructive pulmonary disease, unspecified: Secondary | ICD-10-CM

## 2019-08-03 DIAGNOSIS — J4489 Other specified chronic obstructive pulmonary disease: Secondary | ICD-10-CM

## 2019-08-03 NOTE — Patient Instructions (Signed)
Chronic Obstructive Pulmonary Disease °Chronic obstructive pulmonary disease (COPD) is a long-term (chronic) lung problem. When you have COPD, it is hard for air to get in and out of your lungs. Usually the condition gets worse over time, and your lungs will never return to normal. There are things you can do to keep yourself as healthy as possible. °· Your doctor may treat your condition with: °? Medicines. °? Oxygen. °? Lung surgery. °· Your doctor may also recommend: °? Rehabilitation. This includes steps to make your body work better. It may involve a team of specialists. °? Quitting smoking, if you smoke. °? Exercise and changes to your diet. °? Comfort measures (palliative care). °Follow these instructions at home: °Medicines °· Take over-the-counter and prescription medicines only as told by your doctor. °· Talk to your doctor before taking any cough or allergy medicines. You may need to avoid medicines that cause your lungs to be dry. °Lifestyle °· If you smoke, stop. Smoking makes the problem worse. If you need help quitting, ask your doctor. °· Avoid being around things that make your breathing worse. This may include smoke, chemicals, and fumes. °· Stay active, but remember to rest as well. °· Learn and use tips on how to relax. °· Make sure you get enough sleep. Most adults need at least 7 hours of sleep every night. °· Eat healthy foods. Eat smaller meals more often. Rest before meals. °Controlled breathing °Learn and use tips on how to control your breathing as told by your doctor. Try: °· Breathing in (inhaling) through your nose for 1 second. Then, pucker your lips and breath out (exhale) through your lips for 2 seconds. °· Putting one hand on your belly (abdomen). Breathe in slowly through your nose for 1 second. Your hand on your belly should move out. Pucker your lips and breathe out slowly through your lips. Your hand on your belly should move in as you breathe out. ° °Controlled coughing °Learn  and use controlled coughing to clear mucus from your lungs. Follow these steps: °1. Lean your head a little forward. °2. Breathe in deeply. °3. Try to hold your breath for 3 seconds. °4. Keep your mouth slightly open while coughing 2 times. °5. Spit any mucus out into a tissue. °6. Rest and do the steps again 1 or 2 times as needed. °General instructions °· Make sure you get all the shots (vaccines) that your doctor recommends. Ask your doctor about a flu shot and a pneumonia shot. °· Use oxygen therapy and pulmonary rehabilitation if told by your doctor. If you need home oxygen therapy, ask your doctor if you should buy a tool to measure your oxygen level (oximeter). °· Make a COPD action plan with your doctor. This helps you to know what to do if you feel worse than usual. °· Manage any other conditions you have as told by your doctor. °· Avoid going outside when it is very hot, cold, or humid. °· Avoid people who have a sickness you can catch (contagious). °· Keep all follow-up visits as told by your doctor. This is important. °Contact a doctor if: °· You cough up more mucus than usual. °· There is a change in the color or thickness of the mucus. °· It is harder to breathe than usual. °· Your breathing is faster than usual. °· You have trouble sleeping. °· You need to use your medicines more often than usual. °· You have trouble doing your normal activities such as getting dressed   or walking around the house. °Get help right away if: °· You have shortness of breath while resting. °· You have shortness of breath that stops you from: °? Being able to talk. °? Doing normal activities. °· Your chest hurts for longer than 5 minutes. °· Your skin color is more blue than usual. °· Your pulse oximeter shows that you have low oxygen for longer than 5 minutes. °· You have a fever. °· You feel too tired to breathe normally. °Summary °· Chronic obstructive pulmonary disease (COPD) is a long-term lung problem. °· The way your  lungs work will never return to normal. Usually the condition gets worse over time. There are things you can do to keep yourself as healthy as possible. °· Take over-the-counter and prescription medicines only as told by your doctor. °· If you smoke, stop. Smoking makes the problem worse. °This information is not intended to replace advice given to you by your health care provider. Make sure you discuss any questions you have with your health care provider. °Document Released: 04/29/2008 Document Revised: 10/24/2017 Document Reviewed: 12/16/2016 °Elsevier Patient Education © 2020 [employer] ° °

## 2019-08-03 NOTE — Progress Notes (Signed)
[facility] [place], [place] [address]  Pulmonary Sleep Medicine   Office Visit Note  Patient Name: Rachael Blankenship DOB: 1955-12-21 MRN [id number]  Date of Service: 08/03/2019  Complaints/HPI: Patient states that she has been having issues with shortness of breath.  Unfortunately she continues to smoke.  I reviewed with her the pathophysiology of emphysema COPD and explained to her that if she continues to smoke she is going to continue to lose alveolar surface area and alveolar tissue and lung tissue and she understands this however she states that it is quite difficult for her to quit smoking.  Explained to her that this is a very important decision that she has to make as it could seriously impact her future in terms of her ability to do normal activities of living even.  ROS  General: (-) fever, (-) chills, (-) night sweats, (-) weakness Skin: (-) rashes, (-) itching,. Eyes: (-) visual changes, (-) redness, (-) itching. Nose and Sinuses: (-) nasal stuffiness or itchiness, (-) postnasal drip, (-) nosebleeds, (-) sinus trouble. Mouth and Throat: (-) sore throat, (-) hoarseness. Neck: (-) swollen glands, (-) enlarged thyroid, (-) neck pain. Respiratory: - cough, (-) bloody sputum, + shortness of breath, - wheezing. Cardiovascular: - ankle swelling, (-) chest pain. Lymphatic: (-) lymph node enlargement. Neurologic: (-) numbness, (-) tingling. Psychiatric: (-) anxiety, (-) depression   Current Medication: Outpatient Encounter Medications as of 08/03/2019  Medication Sig  . alendronate (FOSAMAX) 70 MG tablet Take 70 mg by mouth once a week. Take with a full glass of water on an empty stomach.  . ALPRAZolam (XANAX) 0.25 MG tablet Take 1 tablet (0.25 mg total) by mouth 3 (three) times daily as needed for anxiety.  Marland Kitchen ascorbic acid (VITAMIN C) 500 MG tablet Take 500 mg by mouth daily.  Marland Kitchen aspirin 81 MG tablet Take 81 mg by mouth at bedtime.   Marland Kitchen buPROPion  (WELLBUTRIN XL) 150 MG 24 hr tablet Take 1 tablet (150 mg total) by mouth daily.  . Cholecalciferol (VITAMIN D-1000 MAX ST) 1000 units tablet Take 1,000 Units by mouth daily.   . citalopram (CELEXA) 20 MG tablet Take 1 tablet (20 mg total) by mouth daily.  . Coenzyme Q10 (CO Q-10) 100 MG CAPS Take 200 mg by mouth daily.   . DULERA 200-5 MCG/ACT AERO INHALE 1 PUFF BY MOUTH TWICE DAILY  . furosemide (LASIX) 40 MG tablet TAKE 1 TABLET(40 MG) BY MOUTH DAILY  . ibuprofen (ADVIL,MOTRIN) 800 MG tablet Take 800 mg by mouth every 8 (eight) hours as needed for mild pain.   . Ipratropium-Albuterol (COMBIVENT RESPIMAT) 20-100 MCG/ACT AERS respimat Inhale 1 puff into the lungs every 6 (six) hours as needed for wheezing or shortness of breath.  Marland Kitchen ipratropium-albuterol (DUONEB) 0.5-2.5 (3) MG/3ML SOLN Use 1 vial via nebulizer every 4 t0 6 hrs  . Magnesium 400 MG TABS Take 400 mg by mouth daily.   . mometasone (NASONEX) 50 MCG/ACT nasal spray Place 2 sprays into the nose daily.  . montelukast (SINGULAIR) 10 MG tablet TAKE 1 TABLET BY MOUTH DAILY FOR RHINITIS OR ALLERGIES  . omeprazole (PRILOSEC) 40 MG capsule Take 1 capsule (40 mg total) by mouth 2 (two) times daily.  . OXYGEN Inhale into the lungs. Oxygen at night, 2 liters  . Potassium Chloride CR (MICRO-K) 8 MEQ CPCR capsule CR TAKE 1 CAPSULE BY MOUTH DAILY. MAY TAKE EXTRA 2-3 TIMES A WEEK  . simvastatin (ZOCOR) 20 MG tablet Take 1 tablet (  20 mg total) by mouth every evening. for cholesterol  . sucralfate (CARAFATE) 1 g tablet TAKE 1 TABLET(1 GRAM) BY MOUTH FOUR TIMES DAILY AT BEDTIME WITH MEALS  . tiotropium (SPIRIVA) 18 MCG inhalation capsule Place 1 capsule (18 mcg total) into inhaler and inhale daily.  . vitamin B-12 (CYANOCOBALAMIN) 1000 MCG tablet Take 1,000 mcg by mouth daily.  . [DISCONTINUED] predniSONE (STERAPRED UNI-PAK 21 TAB) 10 MG (21) TBPK tablet 6 day taper - take by mouth as directed for 6 days (Patient not taking: Reported on 08/03/2019)   No  facility-administered encounter medications on file as of 08/03/2019.     Surgical History: Past Surgical History:  Procedure Laterality Date  . ESOPHAGOGASTRODUODENOSCOPY N/A 07/29/2018   Procedure: ESOPHAGOGASTRODUODENOSCOPY (EGD);  Surgeon: Toledo, Benay Pike, MD;  Location: [facility] ENDOSCOPY;  Service: Gastroenterology;  Laterality: N/A;  . KIDNEY STONE SURGERY    . TONSILLECTOMY      Medical History: Past Medical History:  Diagnosis Date  . COPD (chronic obstructive pulmonary disease) ([place])   . Endometriosis   . GERD (gastroesophageal reflux disease)   . Hyperlipidemia   . Kidney stones     Family History: Family History  Problem Relation Age of Onset  . Hypertension Mother   . Hyperlipidemia Mother   . GER disease Mother   . Heart attack Father        23 yrs ago  . Hypertension Father   . Parkinson's disease Father   . GER disease Father     Social History: Social History   Socioeconomic History  . Marital status: Married    Spouse name: Not on file  . Number of children: Not on file  . Years of education: Not on file  . Highest education level: Not on file  Occupational History  . Not on file  Social Needs  . Financial resource strain: Not on file  . Food insecurity    Worry: Not on file    Inability: Not on file  . Transportation needs    Medical: Not on file    Non-medical: Not on file  Tobacco Use  . Smoking status: Current Every Day Smoker    Packs/day: 1.00    Types: Cigarettes    Start date: 11/09/2017  . Smokeless tobacco: Never Used  . Tobacco comment: pt cut back  smoking 1-2 daily  Substance and Sexual Activity  . Alcohol use: Not Currently    Comment: not recently  . Drug use: No  . Sexual activity: Not on file  Lifestyle  . Physical activity    Days per week: Not on file    Minutes per session: Not on file  . Stress: Not on file  Relationships  . Social Herbalist on phone: Not on file    Gets together: Not on file     Attends religious service: Not on file    Active member of club or organization: Not on file    Attends meetings of clubs or organizations: Not on file    Relationship status: Not on file  . Intimate partner violence    Fear of current or ex partner: Not on file    Emotionally abused: Not on file    Physically abused: Not on file    Forced sexual activity: Not on file  Other Topics Concern  . Not on file  Social History Narrative  . Not on file    Vital Signs: Blood pressure 116/64, pulse 80, resp. rate  16, height 5\' 4"  (1.626 m), weight 126 lb (57.2 kg), SpO2 95 %.  Examination: General Appearance: The patient is well-developed, well-nourished, and in no distress. Skin: Gross inspection of skin unremarkable. Head: normocephalic, no gross deformities. Eyes: no gross deformities noted. ENT: ears appear grossly normal no exudates. Neck: Supple. No thyromegaly. No LAD. Respiratory: no rhonchi noted. Cardiovascular: Normal S1 and S2 without murmur or rub. Extremities: No cyanosis. pulses are equal. Neurologic: Alert and oriented. No involuntary movements.  LABS: No results found for this or any previous visit (from the past 2160 hour(s)).  Radiology: Nm Hepato W/eject Fract  Result Date: 08/19/2018 CLINICAL DATA:  Epigastric and upper abdominal pain EXAM: NUCLEAR MEDICINE HEPATOBILIARY IMAGING WITH GALLBLADDER EF VIEWS: Anterior, right lateral right upper quadrant RADIOPHARMACEUTICALS:  5.693 mCi Tc-68m  Choletec IV COMPARISON:  None. FINDINGS: Liver uptake of radiotracer is unremarkable. There is prompt visualization of gallbladder and small bowel, indicating patency of the cystic and common bile ducts. The patient consumed 8 ounces of Ensure orally with calculation of the computer generated ejection fraction of radiotracer from the gallbladder. The patient did not report clinical symptoms with the oral Ensure consumption. The computer generated ejection fraction of radiotracer from  the gallbladder is normal at 54%, normal greater than 33% using the oral agent. IMPRESSION: Study within normal limits. Electronically Signed   By: Lowella Grip III M.D.   On: 08/19/2018 13:59    No results found.  No results found.    Assessment and Plan: Patient Active Problem List   Diagnosis Date Noted  . Need for vaccination against Streptococcus pneumoniae using pneumococcal conjugate vaccine 13 03/29/2019  . Cigarette smoker 11/26/2018  . Chronic obstructive pulmonary disease ([place]) 08/25/2018  . Gastroesophageal reflux disease without esophagitis 08/25/2018  . Black stool 08/25/2018  . Flu vaccine need 08/25/2018  . Acute respiratory failure with hypoxemia ([place]) 05/21/2018  . Screening for osteoporosis 05/13/2018  . Acute upper respiratory infection 05/13/2018  . Other fatigue 05/13/2018  . Generalized anxiety disorder 05/13/2018  . Vitamin D deficiency 05/13/2018  . Screening for malignant neoplasm of cervix 05/13/2018  . Dysuria 05/13/2018  . Acute chest pain 01/18/2015  . SOB (shortness of breath) 01/18/2015  . Hyperlipemia 03/08/2014  . Pulmonary HTN ([place]) 03/08/2014  . Reactive airway disease 03/08/2014    1. COPD she has advanced disease she is using inhalers and she will continue to do so.  We had very lengthy discussion about smoking cessation which basically she is not at a point where she wants to quit smoking. 2. Cigarette Smoker smoking cessation discussed with her at length.  She still continues to smoke significant amount of cigarettes which is likely making her situation even worse. 3. GERD at baseline right now we will continue with present management 4. Pulmonary HTN basically unchanged we will continue with supportive care   General Counseling: I have discussed the findings of the evaluation and examination with Neoma Laming.  I have also discussed any further diagnostic evaluation thatmay be needed or ordered today. Keni verbalizes understanding of  the findings of todays visit. We also reviewed her medications today and discussed drug interactions and side effects including but not limited excessive drowsiness and altered mental states. We also discussed that there is always a risk not just to her but also people around her. she has been encouraged to call the office with any questions or concerns that should arise related to todays visit.    Time spent: 68min  I have  personally obtained a history, examined the patient, evaluated laboratory and imaging results, formulated the assessment and plan and placed orders.    Allyne Gee, MD [facility] Pulmonary and Critical Care Sleep medicine

## 2019-08-04 ENCOUNTER — Telehealth: Payer: Self-pay

## 2019-08-04 NOTE — Telephone Encounter (Signed)
Pt advised we mailed lasbslip to pt

## 2019-08-10 ENCOUNTER — Other Ambulatory Visit: Payer: Self-pay | Admitting: Internal Medicine

## 2019-08-10 DIAGNOSIS — E876 Hypokalemia: Secondary | ICD-10-CM

## 2019-08-10 DIAGNOSIS — I272 Pulmonary hypertension, unspecified: Secondary | ICD-10-CM

## 2019-08-12 ENCOUNTER — Ambulatory Visit: Payer: Medicare Other | Admitting: Internal Medicine

## 2019-08-23 ENCOUNTER — Other Ambulatory Visit: Payer: Self-pay | Admitting: Adult Health

## 2019-08-23 DIAGNOSIS — J449 Chronic obstructive pulmonary disease, unspecified: Secondary | ICD-10-CM

## 2019-08-23 MED ORDER — POTASSIUM CHLORIDE ER 8 MEQ PO CPCR: ORAL_CAPSULE | ORAL | 3 refills | Status: DC

## 2019-09-22 ENCOUNTER — Other Ambulatory Visit: Payer: Self-pay

## 2019-09-22 MED ORDER — SUCRALFATE 1 G PO TABS: ORAL_TABLET | ORAL | 0 refills | Status: DC

## 2019-10-04 DIAGNOSIS — H02883 Meibomian gland dysfunction of right eye, unspecified eyelid: Secondary | ICD-10-CM | POA: Diagnosis not present

## 2019-10-20 ENCOUNTER — Ambulatory Visit (INDEPENDENT_AMBULATORY_CARE_PROVIDER_SITE_OTHER): Payer: Medicare Other | Admitting: Adult Health

## 2019-10-20 ENCOUNTER — Other Ambulatory Visit: Payer: Self-pay

## 2019-10-20 ENCOUNTER — Encounter: Payer: Self-pay | Admitting: Adult Health

## 2019-10-20 VITALS — BP 128/66 | HR 84 | Temp 97.6°F | Resp 16 | Ht 64.0 in | Wt 129.0 lb

## 2019-10-20 DIAGNOSIS — T63301A Toxic effect of unspecified spider venom, accidental (unintentional), initial encounter: Secondary | ICD-10-CM | POA: Diagnosis not present

## 2019-10-20 DIAGNOSIS — K219 Gastro-esophageal reflux disease without esophagitis: Secondary | ICD-10-CM

## 2019-10-20 MED ORDER — OMEPRAZOLE 40 MG PO CPDR: 40.0000 mg | DELAYED_RELEASE_CAPSULE | Freq: Two times a day (BID) | ORAL | 1 refills | Status: DC

## 2019-10-20 MED ORDER — DOXYCYCLINE HYCLATE 100 MG PO TABS: 100.0000 mg | ORAL_TABLET | Freq: Two times a day (BID) | ORAL | 0 refills | Status: DC

## 2019-10-20 MED ORDER — SIMVASTATIN 20 MG PO TABS: 20.0000 mg | ORAL_TABLET | Freq: Every evening | ORAL | 0 refills | Status: DC

## 2019-10-20 NOTE — Progress Notes (Signed)
[facility] [place], [place] [address]  Internal MEDICINE  Office Visit Note  Patient Name: Rachael Blankenship  [id number]  [id number]  Date of Service: 10/31/2019  Chief Complaint  Patient presents with  . Insect Bite    on right buttock last week     HPI Pt is here for a sick visit. Patient reports "sitting" on a spider about one week ago and has noticed a spider bite on her bottom. A nodule appeared with a pus filled center. Husband attempted to pop spider bite and blood came out of bite. Patient has been investigating and feels as though it is a brown recluse spider bite because of appearance. Denies fevers, chills, N/V. Area is tender, pain has much improved.    Current Medication:  Outpatient Encounter Medications as of 10/20/2019  Medication Sig  . alendronate (FOSAMAX) 70 MG tablet Take 70 mg by mouth once a week. Take with a full glass of water on an empty stomach.  . ALPRAZolam (XANAX) 0.25 MG tablet Take 1 tablet (0.25 mg total) by mouth 3 (three) times daily as needed for anxiety.  Marland Kitchen ascorbic acid (VITAMIN C) 500 MG tablet Take 500 mg by mouth daily.  Marland Kitchen aspirin 81 MG tablet Take 81 mg by mouth at bedtime.   Marland Kitchen buPROPion (WELLBUTRIN XL) 150 MG 24 hr tablet Take 1 tablet (150 mg total) by mouth daily.  . Cholecalciferol (VITAMIN D-1000 MAX ST) 1000 units tablet Take 1,000 Units by mouth daily.   . citalopram (CELEXA) 20 MG tablet Take 1 tablet (20 mg total) by mouth daily.  . Coenzyme Q10 (CO Q-10) 100 MG CAPS Take 200 mg by mouth daily.   . COMBIVENT RESPIMAT 20-100 MCG/ACT AERS respimat INHALE 1 PUFF INTO THE LUNGS EVERY 6 HOURS AS NEEDED FOR WHEEZING OR SHORTNESS OF BREATH  . DULERA 200-5 MCG/ACT AERO INHALE 1 PUFF BY MOUTH TWICE DAILY  . furosemide (LASIX) 40 MG tablet TAKE 1 TABLET(40 MG) BY MOUTH DAILY  . ibuprofen (ADVIL,MOTRIN) 800 MG tablet Take 800 mg by mouth every 8 (eight) hours as needed for mild pain.   Marland Kitchen ipratropium-albuterol  (DUONEB) 0.5-2.5 (3) MG/3ML SOLN Use 1 vial via nebulizer every 4 t0 6 hrs  . Magnesium 400 MG TABS Take 400 mg by mouth daily.   . mometasone (NASONEX) 50 MCG/ACT nasal spray Place 2 sprays into the nose daily.  . montelukast (SINGULAIR) 10 MG tablet TAKE 1 TABLET BY MOUTH DAILY FOR RHINITIS OR ALLERGIES  . omeprazole (PRILOSEC) 40 MG capsule Take 1 capsule (40 mg total) by mouth 2 (two) times daily.  . OXYGEN Inhale into the lungs. Oxygen at night, 2 liters  . Potassium Chloride CR (MICRO-K) 8 MEQ CPCR capsule CR TAKE 1 CAPSULE BY MOUTH DAILY. MAY TAKE EXTRA 2-3 TIMES A WEEK  . simvastatin (ZOCOR) 20 MG tablet Take 1 tablet (20 mg total) by mouth every evening. for cholesterol  . sucralfate (CARAFATE) 1 g tablet TAKE 1 TABLET(1 GRAM) BY MOUTH FOUR TIMES DAILY AT BEDTIME WITH MEALS  . tiotropium (SPIRIVA) 18 MCG inhalation capsule Place 1 capsule (18 mcg total) into inhaler and inhale daily.  . vitamin B-12 (CYANOCOBALAMIN) 1000 MCG tablet Take 1,000 mcg by mouth daily.  Marland Kitchen doxycycline (VIBRA-TABS) 100 MG tablet Take 1 tablet (100 mg total) by mouth 2 (two) times daily.   No facility-administered encounter medications on file as of 10/20/2019.       Medical History: Past Medical History:  Diagnosis  Date  . COPD (chronic obstructive pulmonary disease) ([place])   . Endometriosis   . GERD (gastroesophageal reflux disease)   . Hyperlipidemia   . Kidney stones      Vital Signs: BP 128/66   Pulse 84   Temp 97.6 F (36.4 C)   Resp 16   Ht 5\' 4"  (1.626 m)   Wt 129 lb (58.5 kg)   SpO2 95%   BMI 22.14 kg/m    Review of Systems  Constitutional: Negative for chills, fatigue and unexpected weight change.  HENT: Negative for congestion, rhinorrhea, sneezing and sore throat.   Eyes: Negative for photophobia, pain and redness.  Respiratory: Negative for cough, chest tightness and shortness of breath.   Cardiovascular: Negative for chest pain and palpitations.  Gastrointestinal: Negative  for abdominal pain, constipation, diarrhea, nausea and vomiting.  Endocrine: Negative.   Genitourinary: Negative for dysuria and frequency.  Musculoskeletal: Negative for arthralgias, back pain, joint swelling and neck pain.  Skin: Negative for rash.       Spider bite on left buttock  Allergic/Immunologic: Negative.   Neurological: Negative for tremors and numbness.  Hematological: Negative for adenopathy. Does not bruise/bleed easily.  Psychiatric/Behavioral: Negative for behavioral problems and sleep disturbance. The patient is not nervous/anxious.     Physical Exam Vitals signs and nursing note reviewed.  Constitutional:      General: She is not in acute distress.    Appearance: She is well-developed. She is not diaphoretic.  HENT:     Head: Normocephalic and atraumatic.     Mouth/Throat:     Pharynx: No oropharyngeal exudate.  Eyes:     Pupils: Pupils are equal, round, and reactive to light.  Neck:     Musculoskeletal: Normal range of motion and neck supple.     Thyroid: No thyromegaly.     Vascular: No JVD.     Trachea: No tracheal deviation.  Cardiovascular:     Rate and Rhythm: Normal rate and regular rhythm.     Heart sounds: Normal heart sounds. No murmur. No friction rub. No gallop.   Pulmonary:     Effort: Pulmonary effort is normal. No respiratory distress.     Breath sounds: Normal breath sounds. No wheezing or rales.  Chest:     Chest wall: No tenderness.  Abdominal:     Palpations: Abdomen is soft.     Tenderness: There is no abdominal tenderness. There is no guarding.  Musculoskeletal: Normal range of motion.  Lymphadenopathy:     Cervical: No cervical adenopathy.  Skin:    General: Skin is warm and dry.     Findings: Abscess present.          Comments: Spider bite below left gluteal fold Small opened boil/abscess. Surrounding redness. Boil/abscess is hard, firm, Open center with mild pus drainage.  Neurological:     Mental Status: She is alert and  oriented to person, place, and time.     Cranial Nerves: No cranial nerve deficit.  Psychiatric:        Behavior: Behavior normal.        Thought Content: Thought content normal.        Judgment: Judgment normal.    Assessment/Plan: 1. Spider bite wound, accidental or unintentional, initial encounter Reports spider bite that occurred 1 week prior. Mild pus drainage, small boil/abscess with open center and firm/hard and tender nodule. Advised to take medication as prescribed with a meal to avoid GI upset. Advised to call if bite becomes  more tender or becomes febrile. - doxycycline (VIBRA-TABS) 100 MG tablet; Take 1 tablet (100 mg total) by mouth 2 (two) times daily.  Dispense: 28 tablet; Refill: 0  General Counseling: Sangeeta verbalizes understanding of the findings of todays visit and agrees with plan of treatment. I have discussed any further diagnostic evaluation that may be needed or ordered today. We also reviewed her medications today. she has been encouraged to call the office with any questions or concerns that should arise related to todays visit.   No orders of the defined types were placed in this encounter.   Meds ordered this encounter  Medications  . doxycycline (VIBRA-TABS) 100 MG tablet    Sig: Take 1 tablet (100 mg total) by mouth 2 (two) times daily.    Dispense:  28 tablet    Refill:  0    Time spent: 15 Minutes  This patient was seen by Orson Gear AGNP-C in Collaboration with Dr Lavera Guise as a part of collaborative care agreement.  Kendell Bane AGNP-C Internal Medicine

## 2019-10-28 DIAGNOSIS — E876 Hypokalemia: Secondary | ICD-10-CM | POA: Diagnosis not present

## 2019-10-29 LAB — BASIC METABOLIC PANEL
BUN/Creatinine Ratio: 23 (ref 12–28)
BUN: 15 mg/dL (ref 8–27)
CO2: 25 mmol/L (ref 20–29)
Calcium: 9.3 mg/dL (ref 8.7–10.3)
Chloride: 102 mmol/L (ref 96–106)
Creatinine, Ser: 0.66 mg/dL (ref 0.57–1.00)
GFR calc Af Amer: 107 mL/min/{1.73_m2} (ref >59)
GFR calc non Af Amer: 93 mL/min/{1.73_m2} (ref >59)
Glucose: 108 mg/dL — ABNORMAL HIGH (ref 65–99)
Potassium: 4 mmol/L (ref 3.5–5.2)
Sodium: 144 mmol/L (ref 134–144)

## 2019-11-21 ENCOUNTER — Other Ambulatory Visit: Payer: Self-pay | Admitting: Adult Health

## 2019-11-29 DIAGNOSIS — M546 Pain in thoracic spine: Secondary | ICD-10-CM | POA: Diagnosis not present

## 2019-11-29 DIAGNOSIS — M9902 Segmental and somatic dysfunction of thoracic region: Secondary | ICD-10-CM | POA: Diagnosis not present

## 2019-11-29 DIAGNOSIS — M5414 Radiculopathy, thoracic region: Secondary | ICD-10-CM | POA: Diagnosis not present

## 2019-12-07 ENCOUNTER — Other Ambulatory Visit: Payer: Self-pay

## 2019-12-07 DIAGNOSIS — F411 Generalized anxiety disorder: Secondary | ICD-10-CM

## 2019-12-14 ENCOUNTER — Other Ambulatory Visit: Payer: Self-pay

## 2019-12-14 MED ORDER — SUCRALFATE 1 G PO TABS: ORAL_TABLET | ORAL | 3 refills | Status: DC

## 2019-12-16 ENCOUNTER — Telehealth: Payer: Self-pay

## 2019-12-16 MED ORDER — ALPRAZOLAM 0.25 MG PO TABS: 0.2500 mg | ORAL_TABLET | Freq: Three times a day (TID) | ORAL | 2 refills | Status: DC | PRN

## 2019-12-16 NOTE — Telephone Encounter (Signed)
Called informed patient of virtual visit. klh

## 2019-12-20 ENCOUNTER — Ambulatory Visit (INDEPENDENT_AMBULATORY_CARE_PROVIDER_SITE_OTHER): Payer: Medicare Other | Admitting: Adult Health

## 2019-12-20 ENCOUNTER — Other Ambulatory Visit: Payer: Self-pay

## 2019-12-20 ENCOUNTER — Encounter: Payer: Self-pay | Admitting: Adult Health

## 2019-12-20 VITALS — BP 112/64 | HR 71 | Ht 64.0 in | Wt 126.0 lb

## 2019-12-20 DIAGNOSIS — I272 Pulmonary hypertension, unspecified: Secondary | ICD-10-CM

## 2019-12-20 DIAGNOSIS — K219 Gastro-esophageal reflux disease without esophagitis: Secondary | ICD-10-CM | POA: Diagnosis not present

## 2019-12-20 DIAGNOSIS — J449 Chronic obstructive pulmonary disease, unspecified: Secondary | ICD-10-CM

## 2019-12-20 DIAGNOSIS — E7849 Other hyperlipidemia: Secondary | ICD-10-CM | POA: Diagnosis not present

## 2019-12-20 DIAGNOSIS — F17209 Nicotine dependence, unspecified, with unspecified nicotine-induced disorders: Secondary | ICD-10-CM | POA: Diagnosis not present

## 2019-12-20 DIAGNOSIS — F411 Generalized anxiety disorder: Secondary | ICD-10-CM | POA: Diagnosis not present

## 2019-12-20 NOTE — Progress Notes (Signed)
[facility] [place], [place] [address]  Internal MEDICINE  Telephone Visit  Patient Name: Rachael Blankenship  [id number]  [id number]  Date of Service: 12/20/2019  I connected with the patient at 207 by telephone and verified the patients identity using two identifiers.   I discussed the limitations, risks, security and privacy concerns of performing an evaluation and management service by telephone and the availability of in person appointments. I also discussed with the patient that there may be a patient responsible charge related to the service.  The patient expressed understanding and agrees to proceed.    Chief Complaint  Patient presents with  . Telephone Assessment  . Telephone Screen  . Hyperlipidemia  . Gastroesophageal Reflux    HPI  Pt is seen today via video.  She is following up on HLD, GERD, copd and anxiety.  Overall she is doing well.  She denies any new or current issues.  She reports that her anxiety and GERD are well controlled at this time. She currently uses Xanax PRN for anxiety.  Her lipid panel from last year was WNL.  She remains on simvastatin.    Current Medication: Outpatient Encounter Medications as of 12/20/2019  Medication Sig  . alendronate (FOSAMAX) 70 MG tablet Take 70 mg by mouth once a week. Take with a full glass of water on an empty stomach.  . ALPRAZolam (XANAX) 0.25 MG tablet Take 1 tablet (0.25 mg total) by mouth 3 (three) times daily as needed for anxiety.  Marland Kitchen ascorbic acid (VITAMIN C) 500 MG tablet Take 500 mg by mouth daily.  Marland Kitchen aspirin 81 MG tablet Take 81 mg by mouth at bedtime.   Marland Kitchen buPROPion (WELLBUTRIN XL) 150 MG 24 hr tablet Take 1 tablet (150 mg total) by mouth daily.  . Cholecalciferol (VITAMIN D-1000 MAX ST) 1000 units tablet Take 1,000 Units by mouth daily.   . citalopram (CELEXA) 20 MG tablet Take 1 tablet (20 mg total) by mouth daily.  . Coenzyme Q10 (CO Q-10) 100 MG CAPS Take 200 mg by mouth daily.   .  COMBIVENT RESPIMAT 20-100 MCG/ACT AERS respimat INHALE 1 PUFF INTO THE LUNGS EVERY 6 HOURS AS NEEDED FOR WHEEZING OR SHORTNESS OF BREATH  . doxycycline (VIBRA-TABS) 100 MG tablet Take 1 tablet (100 mg total) by mouth 2 (two) times daily.  . DULERA 200-5 MCG/ACT AERO INHALE 1 PUFF BY MOUTH TWICE DAILY  . furosemide (LASIX) 40 MG tablet TAKE 1 TABLET(40 MG) BY MOUTH DAILY  . ibuprofen (ADVIL,MOTRIN) 800 MG tablet Take 800 mg by mouth every 8 (eight) hours as needed for mild pain.   Marland Kitchen ipratropium-albuterol (DUONEB) 0.5-2.5 (3) MG/3ML SOLN Use 1 vial via nebulizer every 4 t0 6 hrs  . Magnesium 400 MG TABS Take 400 mg by mouth daily.   . mometasone (NASONEX) 50 MCG/ACT nasal spray Place 2 sprays into the nose daily.  . montelukast (SINGULAIR) 10 MG tablet TAKE 1 TABLET BY MOUTH DAILY FOR RHINITIS OR ALLERGIES  . omeprazole (PRILOSEC) 40 MG capsule Take 1 capsule (40 mg total) by mouth 2 (two) times daily.  . OXYGEN Inhale into the lungs. Oxygen at night, 2 liters  . Potassium Chloride CR (MICRO-K) 8 MEQ CPCR capsule CR TAKE 1 CAPSULE BY MOUTH DAILY. MAY TAKE EXTRA 2-3 TIMES A WEEK  . simvastatin (ZOCOR) 20 MG tablet Take 1 tablet (20 mg total) by mouth every evening. for cholesterol  . SPIRIVA HANDIHALER 18 MCG inhalation capsule INHALE CONTENTS OF  1 CAPSULE ONCE DAILY USING HANDIHALER  . sucralfate (CARAFATE) 1 g tablet TAKE 1 TABLET(1 GRAM) BY MOUTH FOUR TIMES DAILY AT BEDTIME WITH MEALS  . vitamin B-12 (CYANOCOBALAMIN) 1000 MCG tablet Take 1,000 mcg by mouth daily.   No facility-administered encounter medications on file as of 12/20/2019.    Surgical History: Past Surgical History:  Procedure Laterality Date  . ESOPHAGOGASTRODUODENOSCOPY N/A 07/29/2018   Procedure: ESOPHAGOGASTRODUODENOSCOPY (EGD);  Surgeon: Toledo, Benay Pike, MD;  Location: [facility] ENDOSCOPY;  Service: Gastroenterology;  Laterality: N/A;  . KIDNEY STONE SURGERY    . TONSILLECTOMY      Medical History: Past Medical History:   Diagnosis Date  . COPD (chronic obstructive pulmonary disease) ([place])   . Endometriosis   . GERD (gastroesophageal reflux disease)   . Hyperlipidemia   . Kidney stones     Family History: Family History  Problem Relation Age of Onset  . Hypertension Mother   . Hyperlipidemia Mother   . GER disease Mother   . Heart attack Father        23 yrs ago  . Hypertension Father   . Parkinson's disease Father   . GER disease Father     Social History   Socioeconomic History  . Marital status: Married    Spouse name: Not on file  . Number of children: Not on file  . Years of education: Not on file  . Highest education level: Not on file  Occupational History  . Not on file  Tobacco Use  . Smoking status: Current Every Day Smoker    Packs/day: 1.00    Types: Cigarettes    Start date: 11/09/2017  . Smokeless tobacco: Never Used  . Tobacco comment: 1 pack a day   Substance and Sexual Activity  . Alcohol use: Yes    Comment: ocassionally   . Drug use: No  . Sexual activity: Not on file  Other Topics Concern  . Not on file  Social History Narrative  . Not on file   Social Determinants of Health   Financial Resource Strain:   . Difficulty of Paying Living Expenses: Not on file  Food Insecurity:   . Worried About Charity fundraiser in the Last Year: Not on file  . Ran Out of Food in the Last Year: Not on file  Transportation Needs:   . Lack of Transportation (Medical): Not on file  . Lack of Transportation (Non-Medical): Not on file  Physical Activity:   . Days of Exercise per Week: Not on file  . Minutes of Exercise per Session: Not on file  Stress:   . Feeling of Stress : Not on file  Social Connections:   . Frequency of Communication with Friends and Family: Not on file  . Frequency of Social Gatherings with Friends and Family: Not on file  . Attends Religious Services: Not on file  . Active Member of Clubs or Organizations: Not on file  . Attends Theatre manager Meetings: Not on file  . Marital Status: Not on file  Intimate Partner Violence:   . Fear of Current or Ex-Partner: Not on file  . Emotionally Abused: Not on file  . Physically Abused: Not on file  . Sexually Abused: Not on file      Review of Systems  Constitutional: Negative for chills, fatigue and unexpected weight change.  HENT: Negative for congestion, rhinorrhea, sneezing and sore throat.   Eyes: Negative for photophobia, pain and redness.  Respiratory: Negative for  cough, chest tightness and shortness of breath.   Cardiovascular: Negative for chest pain and palpitations.  Gastrointestinal: Negative for abdominal pain, constipation, diarrhea, nausea and vomiting.  Endocrine: Negative.   Genitourinary: Negative for dysuria and frequency.  Musculoskeletal: Negative for arthralgias, back pain, joint swelling and neck pain.  Skin: Negative for rash.  Allergic/Immunologic: Negative.   Neurological: Negative for tremors and numbness.  Hematological: Negative for adenopathy. Does not bruise/bleed easily.  Psychiatric/Behavioral: Negative for behavioral problems and sleep disturbance. The patient is not nervous/anxious.     Vital Signs: BP 112/64   Pulse 71   Ht 5\' 4"  (1.626 m)   Wt 126 lb (57.2 kg)   BMI 21.63 kg/m    Observation/Objective:  Well appearing, NAD noted.    Assessment/Plan: 1. Gastroesophageal reflux disease without esophagitis Stable, continue present management.  2. Other hyperlipidemia Stable, continue statin therapy.   3. Chronic obstructive pulmonary disease, unspecified COPD type ([place]) Stable, continue to use inhalers as directed. Follow up with pulmonary  4. Pulmonary HTN (HCC) Stable, continue to monitor.  5. Generalized anxiety disorder Continue to use xanax as directed.  Controlled currently.   6. Nicotine dependence with nicotine-induced disorder, unspecified nicotine product type Smoking cessation counseling: 1. Pt  acknowledges the risks of long term smoking, she will try to quite smoking. 2. Options for different medications including nicotine products, chewing gum, patch etc, Wellbutrin and Chantix is discussed 3. Goal and date of compete cessation is discussed 4. Total time spent in smoking cessation is 15 min.   General Counseling: Rachael Blankenship understanding of the findings of today's phone visit and agrees with plan of treatment. I have discussed any further diagnostic evaluation that may be needed or ordered today. We also reviewed her medications today. she has been encouraged to call the office with any questions or concerns that should arise related to todays visit.    No orders of the defined types were placed in this encounter.   No orders of the defined types were placed in this encounter.   Time spent: [facility] Internal medicine

## 2020-01-17 ENCOUNTER — Other Ambulatory Visit: Payer: Self-pay

## 2020-01-17 MED ORDER — SIMVASTATIN 20 MG PO TABS: 20.0000 mg | ORAL_TABLET | Freq: Every evening | ORAL | 0 refills | Status: DC

## 2020-01-17 MED ORDER — FUROSEMIDE 40 MG PO TABS: ORAL_TABLET | ORAL | 3 refills | Status: DC

## 2020-01-28 ENCOUNTER — Telehealth: Payer: Self-pay

## 2020-01-28 NOTE — Telephone Encounter (Signed)
Called confirmed appointment on 02/01/2020 and screened for covid. klh

## 2020-02-01 ENCOUNTER — Other Ambulatory Visit: Payer: Self-pay

## 2020-02-01 ENCOUNTER — Encounter: Payer: Self-pay | Admitting: Internal Medicine

## 2020-02-01 ENCOUNTER — Ambulatory Visit (INDEPENDENT_AMBULATORY_CARE_PROVIDER_SITE_OTHER): Payer: Medicare Other | Admitting: Internal Medicine

## 2020-02-01 VITALS — BP 105/67 | HR 80 | Temp 97.4°F | Resp 16 | Ht 64.0 in | Wt 128.0 lb

## 2020-02-01 DIAGNOSIS — K219 Gastro-esophageal reflux disease without esophagitis: Secondary | ICD-10-CM | POA: Diagnosis not present

## 2020-02-01 DIAGNOSIS — F411 Generalized anxiety disorder: Secondary | ICD-10-CM

## 2020-02-01 DIAGNOSIS — R0602 Shortness of breath: Secondary | ICD-10-CM

## 2020-02-01 DIAGNOSIS — F17209 Nicotine dependence, unspecified, with unspecified nicotine-induced disorders: Secondary | ICD-10-CM

## 2020-02-01 DIAGNOSIS — J449 Chronic obstructive pulmonary disease, unspecified: Secondary | ICD-10-CM | POA: Diagnosis not present

## 2020-02-01 DIAGNOSIS — I272 Pulmonary hypertension, unspecified: Secondary | ICD-10-CM

## 2020-02-01 MED ORDER — CITALOPRAM HYDROBROMIDE 40 MG PO TABS: 20.0000 mg | ORAL_TABLET | Freq: Every day | ORAL | 1 refills | Status: DC

## 2020-02-01 NOTE — Progress Notes (Signed)
[facility] [place], [place] [address]  Pulmonary Sleep Medicine   Office Visit Note  Patient Name: Rachael Blankenship DOB: 1956/08/21 MRN [id number]  Date of Service: 02/01/2020  Complaints/HPI: Pt is here for pulmonary follow up. She reports her breathing is about the same. She has COPd and PAH. Unfortunately she continues to smoke. We once again discussed that she should quit smoking to help with her copd symptoms. She denies any recent hospitalizations or illness.  She continues to use her inhalers as prescribed.   ROS  General: (-) fever, (-) chills, (-) night sweats, (-) weakness Skin: (-) rashes, (-) itching,. Eyes: (-) visual changes, (-) redness, (-) itching. Nose and Sinuses: (-) nasal stuffiness or itchiness, (-) postnasal drip, (-) nosebleeds, (-) sinus trouble. Mouth and Throat: (-) sore throat, (-) hoarseness. Neck: (-) swollen glands, (-) enlarged thyroid, (-) neck pain. Respiratory: - cough, (-) bloody sputum, - shortness of breath, - wheezing. Cardiovascular: - ankle swelling, (-) chest pain. Lymphatic: (-) lymph node enlargement. Neurologic: (-) numbness, (-) tingling. Psychiatric: (-) anxiety, (-) depression   Current Medication: Outpatient Encounter Medications as of 02/01/2020  Medication Sig  . alendronate (FOSAMAX) 70 MG tablet Take 70 mg by mouth once a week. Take with a full glass of water on an empty stomach.  . ALPRAZolam (XANAX) 0.25 MG tablet Take 1 tablet (0.25 mg total) by mouth 3 (three) times daily as needed for anxiety.  Marland Kitchen ascorbic acid (VITAMIN C) 500 MG tablet Take 500 mg by mouth daily.  Marland Kitchen aspirin 81 MG tablet Take 81 mg by mouth at bedtime.   Marland Kitchen buPROPion (WELLBUTRIN XL) 150 MG 24 hr tablet Take 1 tablet (150 mg total) by mouth daily.  . Cholecalciferol (VITAMIN D-1000 MAX ST) 1000 units tablet Take 1,000 Units by mouth daily.   . citalopram (CELEXA) 20 MG tablet Take 1 tablet (20 mg total) by mouth daily.  . Coenzyme  Q10 (CO Q-10) 100 MG CAPS Take 200 mg by mouth daily.   . COMBIVENT RESPIMAT 20-100 MCG/ACT AERS respimat INHALE 1 PUFF INTO THE LUNGS EVERY 6 HOURS AS NEEDED FOR WHEEZING OR SHORTNESS OF BREATH  . doxycycline (VIBRA-TABS) 100 MG tablet Take 1 tablet (100 mg total) by mouth 2 (two) times daily.  . DULERA 200-5 MCG/ACT AERO INHALE 1 PUFF BY MOUTH TWICE DAILY  . furosemide (LASIX) 40 MG tablet TAKE 1 TABLET(40 MG) BY MOUTH DAILY  . ibuprofen (ADVIL,MOTRIN) 800 MG tablet Take 800 mg by mouth every 8 (eight) hours as needed for mild pain.   Marland Kitchen ipratropium-albuterol (DUONEB) 0.5-2.5 (3) MG/3ML SOLN Use 1 vial via nebulizer every 4 t0 6 hrs  . Magnesium 400 MG TABS Take 400 mg by mouth daily.   . mometasone (NASONEX) 50 MCG/ACT nasal spray Place 2 sprays into the nose daily.  . montelukast (SINGULAIR) 10 MG tablet TAKE 1 TABLET BY MOUTH DAILY FOR RHINITIS OR ALLERGIES  . omeprazole (PRILOSEC) 40 MG capsule Take 1 capsule (40 mg total) by mouth 2 (two) times daily.  . OXYGEN Inhale into the lungs. Oxygen at night, 2 liters  . Potassium Chloride CR (MICRO-K) 8 MEQ CPCR capsule CR TAKE 1 CAPSULE BY MOUTH DAILY. MAY TAKE EXTRA 2-3 TIMES A WEEK  . simvastatin (ZOCOR) 20 MG tablet Take 1 tablet (20 mg total) by mouth every evening. for cholesterol  . SPIRIVA HANDIHALER 18 MCG inhalation capsule INHALE CONTENTS OF 1 CAPSULE ONCE DAILY USING HANDIHALER  . sucralfate (CARAFATE) 1 g  tablet TAKE 1 TABLET(1 GRAM) BY MOUTH FOUR TIMES DAILY AT BEDTIME WITH MEALS  . vitamin B-12 (CYANOCOBALAMIN) 1000 MCG tablet Take 1,000 mcg by mouth daily.   No facility-administered encounter medications on file as of 02/01/2020.    Surgical History: Past Surgical History:  Procedure Laterality Date  . ESOPHAGOGASTRODUODENOSCOPY N/A 07/29/2018   Procedure: ESOPHAGOGASTRODUODENOSCOPY (EGD);  Surgeon: Toledo, Benay Pike, MD;  Location: [facility] ENDOSCOPY;  Service: Gastroenterology;  Laterality: N/A;  . KIDNEY STONE SURGERY    .  TONSILLECTOMY      Medical History: Past Medical History:  Diagnosis Date  . COPD (chronic obstructive pulmonary disease) ([place])   . Endometriosis   . GERD (gastroesophageal reflux disease)   . Hyperlipidemia   . Kidney stones     Family History: Family History  Problem Relation Age of Onset  . Hypertension Mother   . Hyperlipidemia Mother   . GER disease Mother   . Heart attack Father        23 yrs ago  . Hypertension Father   . Parkinson's disease Father   . GER disease Father     Social History: Social History   Socioeconomic History  . Marital status: Married    Spouse name: Not on file  . Number of children: Not on file  . Years of education: Not on file  . Highest education level: Not on file  Occupational History  . Not on file  Tobacco Use  . Smoking status: Current Every Day Smoker    Packs/day: 1.00    Types: Cigarettes    Start date: 11/09/2017  . Smokeless tobacco: Never Used  . Tobacco comment: 1 pack a day   Substance and Sexual Activity  . Alcohol use: Yes    Comment: ocassionally   . Drug use: No  . Sexual activity: Not on file  Other Topics Concern  . Not on file  Social History Narrative  . Not on file   Social Determinants of Health   Financial Resource Strain:   . Difficulty of Paying Living Expenses: Not on file  Food Insecurity:   . Worried About Charity fundraiser in the Last Year: Not on file  . Ran Out of Food in the Last Year: Not on file  Transportation Needs:   . Lack of Transportation (Medical): Not on file  . Lack of Transportation (Non-Medical): Not on file  Physical Activity:   . Days of Exercise per Week: Not on file  . Minutes of Exercise per Session: Not on file  Stress:   . Feeling of Stress : Not on file  Social Connections:   . Frequency of Communication with Friends and Family: Not on file  . Frequency of Social Gatherings with Friends and Family: Not on file  . Attends Religious Services: Not on file  .  Active Member of Clubs or Organizations: Not on file  . Attends Archivist Meetings: Not on file  . Marital Status: Not on file  Intimate Partner Violence:   . Fear of Current or Ex-Partner: Not on file  . Emotionally Abused: Not on file  . Physically Abused: Not on file  . Sexually Abused: Not on file    Vital Signs: Blood pressure 105/67, pulse 80, temperature (!) 97.4 F (36.3 C), resp. rate 16, height 5\' 4"  (1.626 m), weight 128 lb (58.1 kg), SpO2 90 %.  Examination: General Appearance: The patient is well-developed, well-nourished, and in no distress. Skin: Gross inspection of skin unremarkable.  Head: normocephalic, no gross deformities. Eyes: no gross deformities noted. ENT: ears appear grossly normal no exudates. Neck: Supple. No thyromegaly. No LAD. Respiratory: clear bilaterally. Cardiovascular: Normal S1 and S2 without murmur or rub. Extremities: No cyanosis. pulses are equal. Neurologic: Alert and oriented. No involuntary movements.  LABS: No results found for this or any previous visit (from the past 2160 hour(s)).  Radiology: NM Hepato W/EjeCT Fract  Result Date: 08/19/2018 CLINICAL DATA:  Epigastric and upper abdominal pain EXAM: NUCLEAR MEDICINE HEPATOBILIARY IMAGING WITH GALLBLADDER EF VIEWS: Anterior, right lateral right upper quadrant RADIOPHARMACEUTICALS:  5.693 mCi Tc-29m  Choletec IV COMPARISON:  None. FINDINGS: Liver uptake of radiotracer is unremarkable. There is prompt visualization of gallbladder and small bowel, indicating patency of the cystic and common bile ducts. The patient consumed 8 ounces of Ensure orally with calculation of the computer generated ejection fraction of radiotracer from the gallbladder. The patient did not report clinical symptoms with the oral Ensure consumption. The computer generated ejection fraction of radiotracer from the gallbladder is normal at 54%, normal greater than 33% using the oral agent. IMPRESSION: Study within  normal limits. Electronically Signed   By: Lowella Grip III M.D.   On: 08/19/2018 13:59    No results found.  No results found.    Assessment and Plan: Patient Active Problem List   Diagnosis Date Noted  . Need for vaccination against Streptococcus pneumoniae using pneumococcal conjugate vaccine 13 03/29/2019  . Cigarette smoker 11/26/2018  . Chronic obstructive pulmonary disease ([place]) 08/25/2018  . Gastroesophageal reflux disease without esophagitis 08/25/2018  . Black stool 08/25/2018  . Flu vaccine need 08/25/2018  . Acute respiratory failure with hypoxemia ([place]) 05/21/2018  . Screening for osteoporosis 05/13/2018  . Acute upper respiratory infection 05/13/2018  . Other fatigue 05/13/2018  . Generalized anxiety disorder 05/13/2018  . Vitamin D deficiency 05/13/2018  . Screening for malignant neoplasm of cervix 05/13/2018  . Dysuria 05/13/2018  . Acute chest pain 01/18/2015  . SOB (shortness of breath) 01/18/2015  . Hyperlipemia 03/08/2014  . Pulmonary HTN ([place]) 03/08/2014  . Reactive airway disease 03/08/2014    1. Chronic obstructive pulmonary disease, unspecified COPD type (HCC) Severe disease, FEV1 0.6, continue with inhalers and nebulizer as directed.   2. Gastroesophageal reflux disease without esophagitis Controlled, continue current medications.  3. Pulmonary HTN (HCC) Stable, continue to monitor.  4. Nicotine dependence with nicotine-induced disorder, unspecified nicotine product type Smoking cessation counseling: 1. Pt acknowledges the risks of long term smoking, she will try to quite smoking. 2. Options for different medications including nicotine products, chewing gum, patch etc, Wellbutrin and Chantix is discussed 3. Goal and date of compete cessation is discussed 4. Total time spent in smoking cessation is 15 min.  5. Generalized anxiety disorder Increase celexa to 40mg  - citalopram (CELEXA) 40 MG tablet; Take 0.5 tablets (20 mg total) by mouth  daily.  Dispense: 90 tablet; Refill: 1  6. SOB (shortness of breath) FEV! 0.6 which is 27% of pre-predicted value.  - Spirometry with Graph  General Counseling: I have discussed the findings of the evaluation and examination with Neoma Laming.  I have also discussed any further diagnostic evaluation thatmay be needed or ordered today. Ayde verbalizes understanding of the findings of todays visit. We also reviewed her medications today and discussed drug interactions and side effects including but not limited excessive drowsiness and altered mental states. We also discussed that there is always a risk not just to her but also people around her.  she has been encouraged to call the office with any questions or concerns that should arise related to todays visit.  Orders Placed This Encounter  Procedures  . Spirometry with Graph    Order Specific Question:   Where should this test be performed?    Answer:   [facility]     Time spent: 30 This patient was seen by Orson Gear AGNP-C in Collaboration with Dr. Devona Konig as a part of collaborative care agreement.   I have personally obtained a history, examined the patient, evaluated laboratory and imaging results, formulated the assessment and plan and placed orders.    Allyne Gee, MD [facility] Pulmonary and Critical Care Sleep medicine

## 2020-02-03 ENCOUNTER — Other Ambulatory Visit: Payer: Self-pay | Admitting: Internal Medicine

## 2020-02-03 ENCOUNTER — Other Ambulatory Visit: Payer: Self-pay | Admitting: Adult Health

## 2020-02-03 DIAGNOSIS — J449 Chronic obstructive pulmonary disease, unspecified: Secondary | ICD-10-CM

## 2020-03-04 IMAGING — NM NM HEPATO W/GB/PHARM/[PERSON_NAME]
3 series · 13 of 13 positions shown · non-contrast
Comparison: None.

CLINICAL DATA: Epigastric and upper abdominal pain

EXAM:
NUCLEAR MEDICINE HEPATOBILIARY IMAGING WITH GALLBLADDER EF
VIEWS:
Anterior, right lateral right upper quadrant
RADIOPHARMACEUTICALS:  5.693 mCi Jc-99m  Choletec IV

[Series 1000: gallbladder delays · 3.30mm/px · 1 of 1 slices shown]
[im 1/1]
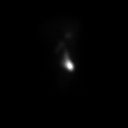

[Series 1000: hepatobiliary scan · 9.59mm/px · 6 of 60 frames shown]
[frame 6/60]
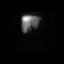
[frame 16/60]
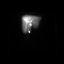
[frame 26/60]
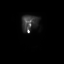
[frame 36/60]
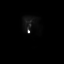
[frame 46/60]
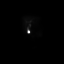
[frame 56/60]
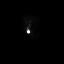

[Series 1000: gallbladder ef · 4.80mm/px · 6 of 120 frames shown]
[frame 11/120]
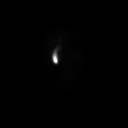
[frame 31/120]
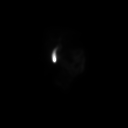
[frame 51/120]
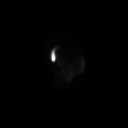
[frame 71/120]
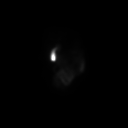
[frame 91/120]
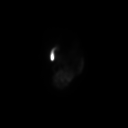
[frame 111/120]
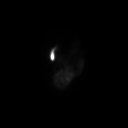

[13 of 13 positions shown; findings below may reference images not displayed]

FINDINGS: Liver uptake of radiotracer is unremarkable. There is prompt
visualization of gallbladder and small bowel, indicating patency of
the cystic and common bile ducts. The patient consumed 8 ounces of
Ensure orally with calculation of the computer generated ejection
fraction of radiotracer from the gallbladder. The patient did not
report clinical symptoms with the oral Ensure consumption. The
computer generated ejection fraction of radiotracer from the
gallbladder is normal at 54%, normal greater than 33% using the oral
agent.
IMPRESSION: Study within normal limits.

## 2020-03-10 ENCOUNTER — Telehealth: Payer: Self-pay

## 2020-03-10 NOTE — Telephone Encounter (Signed)
Confirmed and screened for 03-14-20 ov. 

## 2020-03-14 ENCOUNTER — Ambulatory Visit: Payer: Medicare Other | Admitting: Nurse Practitioner

## 2020-03-14 ENCOUNTER — Telehealth: Payer: Self-pay

## 2020-03-14 NOTE — Telephone Encounter (Signed)
Patient rescheduled appointment on 03/14/2020 to 03/21/2020. klh

## 2020-03-16 ENCOUNTER — Other Ambulatory Visit: Payer: Self-pay | Admitting: Adult Health

## 2020-03-21 ENCOUNTER — Other Ambulatory Visit: Payer: Self-pay

## 2020-03-21 ENCOUNTER — Ambulatory Visit (INDEPENDENT_AMBULATORY_CARE_PROVIDER_SITE_OTHER): Payer: Medicare Other | Admitting: Nurse Practitioner

## 2020-03-21 ENCOUNTER — Encounter: Payer: Self-pay | Admitting: Nurse Practitioner

## 2020-03-21 ENCOUNTER — Other Ambulatory Visit: Payer: Self-pay | Admitting: Adult Health

## 2020-03-21 VITALS — BP 111/69 | HR 75 | Temp 95.4°F | Resp 16 | Ht 64.0 in | Wt 127.4 lb

## 2020-03-21 DIAGNOSIS — J449 Chronic obstructive pulmonary disease, unspecified: Secondary | ICD-10-CM

## 2020-03-21 DIAGNOSIS — M810 Age-related osteoporosis without current pathological fracture: Secondary | ICD-10-CM

## 2020-03-21 DIAGNOSIS — E7849 Other hyperlipidemia: Secondary | ICD-10-CM | POA: Diagnosis not present

## 2020-03-21 DIAGNOSIS — F411 Generalized anxiety disorder: Secondary | ICD-10-CM

## 2020-03-21 MED ORDER — ALPRAZOLAM 0.25 MG PO TABS: 0.2500 mg | ORAL_TABLET | Freq: Three times a day (TID) | ORAL | 2 refills | Status: DC | PRN

## 2020-03-21 NOTE — Progress Notes (Signed)
[facility] [place],  [address]  Internal MEDICINE  Office Visit Note  Patient Name: Rachael Blankenship  [id number]  [id number]  Date of Service: 04/05/2020  Chief Complaint  Patient presents with  . Hyperlipidemia  . Gastroesophageal Reflux  . COPD    The patient is here for routine follow up visit. States that she is doing well overall. She continues to use spiriva and dulera daily and rescue inhaler as needed and as prescribed. Depression and anxiety are well managed with current medications. She takes citalopram every day. She uses alprazolam up to twice daily if needed for acute anxiety. She does need to have a new prescription for this today.       Current Medication: Outpatient Encounter Medications as of 03/21/2020  Medication Sig  . alendronate (FOSAMAX) 70 MG tablet Take 70 mg by mouth once a week. Take with a full glass of water on an empty stomach.  . ALPRAZolam (XANAX) 0.25 MG tablet Take 1 tablet (0.25 mg total) by mouth 3 (three) times daily as needed for anxiety.  Marland Kitchen ascorbic acid (VITAMIN C) 500 MG tablet Take 500 mg by mouth daily.  Marland Kitchen aspirin 81 MG tablet Take 81 mg by mouth at bedtime.   . Cholecalciferol (VITAMIN D-1000 MAX ST) 1000 units tablet Take 1,000 Units by mouth daily.   . citalopram (CELEXA) 40 MG tablet Take 0.5 tablets (20 mg total) by mouth daily.  . Coenzyme Q10 (CO Q-10) 100 MG CAPS Take 200 mg by mouth daily.   . COMBIVENT RESPIMAT 20-100 MCG/ACT AERS respimat INHALE 1 PUFF INTO THE LUNGS EVERY 6 HOURS AS NEEDED FOR WHEEZING OR SHORTNESS OF BREATH  . doxycycline (VIBRA-TABS) 100 MG tablet Take 1 tablet (100 mg total) by mouth 2 (two) times daily.  . DULERA 200-5 MCG/ACT AERO INHALE 1 PUFF BY MOUTH TWICE DAILY  . furosemide (LASIX) 40 MG tablet TAKE 1 TABLET(40 MG) BY MOUTH DAILY  . ibuprofen (ADVIL,MOTRIN) 800 MG tablet Take 800 mg by mouth every 8 (eight) hours as needed for mild pain.   Marland Kitchen ipratropium-albuterol  (DUONEB) 0.5-2.5 (3) MG/3ML SOLN Use 1 vial via nebulizer every 4 t0 6 hrs  . Magnesium 400 MG TABS Take 400 mg by mouth daily.   . mometasone (NASONEX) 50 MCG/ACT nasal spray Place 2 sprays into the nose daily.  . montelukast (SINGULAIR) 10 MG tablet TAKE 1 TABLET BY MOUTH EVERY DAY FOR RHINITIS OR ALLERGIES  . omeprazole (PRILOSEC) 40 MG capsule Take 1 capsule (40 mg total) by mouth 2 (two) times daily.  . OXYGEN Inhale into the lungs. Oxygen at night, 2 liters  . Potassium Chloride CR (MICRO-K) 8 MEQ CPCR capsule CR TAKE 1 CAPSULE BY MOUTH DAILY. MAY TAKE EXTRA 2-3 TIMES A WEEK  . simvastatin (ZOCOR) 20 MG tablet Take 1 tablet (20 mg total) by mouth every evening. for cholesterol  . SPIRIVA HANDIHALER 18 MCG inhalation capsule INHALE CONTENTS OF 1 CAPSULE ONCE DAILY USING HANDIHALER  . sucralfate (CARAFATE) 1 g tablet TAKE 1 TABLET BY MOUTH FOUR TIMES DAILY(WITH MEALS AND AT BEDTIME)  . vitamin B-12 (CYANOCOBALAMIN) 1000 MCG tablet Take 1,000 mcg by mouth daily.  . [DISCONTINUED] ALPRAZolam (XANAX) 0.25 MG tablet Take 1 tablet (0.25 mg total) by mouth 3 (three) times daily as needed for anxiety.   No facility-administered encounter medications on file as of 03/21/2020.    Surgical History: Past Surgical History:  Procedure Laterality Date  . ESOPHAGOGASTRODUODENOSCOPY N/A 07/29/2018  Procedure: ESOPHAGOGASTRODUODENOSCOPY (EGD);  Surgeon: Toledo, Benay Pike, MD;  Location: [facility] ENDOSCOPY;  Service: Gastroenterology;  Laterality: N/A;  . KIDNEY STONE SURGERY    . TONSILLECTOMY      Medical History: Past Medical History:  Diagnosis Date  . COPD (chronic obstructive pulmonary disease) ([place])   . Endometriosis   . GERD (gastroesophageal reflux disease)   . Hyperlipidemia   . Kidney stones     Family History: Family History  Problem Relation Age of Onset  . Hypertension Mother   . Hyperlipidemia Mother   . GER disease Mother   . Heart attack Father        23 yrs ago  .  Hypertension Father   . Parkinson's disease Father   . GER disease Father     Social History   Socioeconomic History  . Marital status: Married    Spouse name: Not on file  . Number of children: Not on file  . Years of education: Not on file  . Highest education level: Not on file  Occupational History  . Not on file  Tobacco Use  . Smoking status: Current Every Day Smoker    Packs/day: 1.00    Types: Cigarettes    Start date: 11/09/2017  . Smokeless tobacco: Never Used  . Tobacco comment: 1 pack a day   Substance and Sexual Activity  . Alcohol use: Yes    Comment: ocassionally   . Drug use: No  . Sexual activity: Not on file  Other Topics Concern  . Not on file  Social History Narrative  . Not on file   Social Determinants of Health   Financial Resource Strain:   . Difficulty of Paying Living Expenses:   Food Insecurity:   . Worried About Charity fundraiser in the Last Year:   . Arboriculturist in the Last Year:   Transportation Needs:   . Film/video editor (Medical):   Marland Kitchen Lack of Transportation (Non-Medical):   Physical Activity:   . Days of Exercise per Week:   . Minutes of Exercise per Session:   Stress:   . Feeling of Stress :   Social Connections:   . Frequency of Communication with Friends and Family:   . Frequency of Social Gatherings with Friends and Family:   . Attends Religious Services:   . Active Member of Clubs or Organizations:   . Attends Archivist Meetings:   Marland Kitchen Marital Status:   Intimate Partner Violence:   . Fear of Current or Ex-Partner:   . Emotionally Abused:   Marland Kitchen Physically Abused:   . Sexually Abused:       Review of Systems  Constitutional: Negative for activity change, chills, fatigue and unexpected weight change.  HENT: Negative for congestion, rhinorrhea, sneezing and sore throat.   Respiratory: Positive for cough, shortness of breath and wheezing. Negative for chest tightness.        Intermittent and well  managed on current medications.   Cardiovascular: Negative for chest pain and palpitations.  Gastrointestinal: Negative for abdominal pain, constipation, diarrhea, nausea and vomiting.  Endocrine: Negative for cold intolerance, heat intolerance, polydipsia and polyuria.  Musculoskeletal: Negative for arthralgias, back pain, joint swelling and neck pain.  Skin: Negative for rash.  Allergic/Immunologic: Positive for environmental allergies.  Neurological: Negative for dizziness, tremors, numbness and headaches.  Hematological: Negative for adenopathy. Does not bruise/bleed easily.  Psychiatric/Behavioral: Positive for dysphoric mood. Negative for behavioral problems and sleep disturbance. The patient is  nervous/anxious.    Today's Vitals   03/21/20 1445  BP: 111/69  Pulse: 75  Resp: 16  Temp: (!) 95.4 F (35.2 C)  SpO2: 90%  Weight: 127 lb 6.4 oz (57.8 kg)  Height: 5\' 4"  (1.626 m)   Body mass index is 21.87 kg/m.  Physical Exam Vitals and nursing note reviewed.  Constitutional:      General: She is not in acute distress.    Appearance: Normal appearance. She is well-developed. She is not diaphoretic.  HENT:     Head: Normocephalic and atraumatic.     Mouth/Throat:     Pharynx: No oropharyngeal exudate.  Eyes:     Conjunctiva/sclera: Conjunctivae normal.     Pupils: Pupils are equal, round, and reactive to light.  Neck:     Thyroid: No thyromegaly.     Vascular: No JVD.     Trachea: No tracheal deviation.  Cardiovascular:     Rate and Rhythm: Normal rate and regular rhythm.     Heart sounds: Normal heart sounds. No murmur. No friction rub. No gallop.   Pulmonary:     Effort: Pulmonary effort is normal. No respiratory distress.     Breath sounds: Wheezing present. No rales.     Comments: Mild wheezing heard in bilateral lower lung fields.  Chest:     Chest wall: No tenderness.  Abdominal:     General: Bowel sounds are normal.     Palpations: Abdomen is soft.      Tenderness: There is no abdominal tenderness.  Musculoskeletal:        General: Normal range of motion.     Cervical back: Normal range of motion and neck supple.  Lymphadenopathy:     Cervical: No cervical adenopathy.  Skin:    General: Skin is warm and dry.  Neurological:     Mental Status: She is alert and oriented to person, place, and time.     Cranial Nerves: No cranial nerve deficit.  Psychiatric:        Attention and Perception: Attention and perception normal.        Mood and Affect: Affect normal. Mood is anxious and depressed.        Speech: Speech normal.        Behavior: Behavior normal. Behavior is cooperative.        Thought Content: Thought content normal.        Cognition and Memory: Cognition and memory normal.        Judgment: Judgment normal.    Assessment/Plan: 1. Obstructive chronic bronchitis without exacerbation (HCC) Stable. Continue to use inhalers and oxygen as prescribed.   2. Other hyperlipidemia Continue to take simvastatin as prescribed   3. Generalized anxiety disorder Continue citalopram 20mg  daily. May take alprazolam 0.25mg  up to three times daily as needed for acute anxiety. New prescription sent to her pharmacy today.  - ALPRAZolam (XANAX) 0.25 MG tablet; Take 1 tablet (0.25 mg total) by mouth 3 (three) times daily as needed for anxiety.  Dispense: 90 tablet; Refill: 2  4. Age-related osteoporosis without current pathological fracture Continue foxamax as prescribed   General Counseling: Shalisa verbalizes understanding of the findings of todays visit and agrees with plan of treatment. I have discussed any further diagnostic evaluation that may be needed or ordered today. We also reviewed her medications today. she has been encouraged to call the office with any questions or concerns that should arise related to todays visit.  This patient was seen by Nira Conn  [place] with Dr Lavera Guise as a part of collaborative care  agreement  Meds ordered this encounter  Medications  . ALPRAZolam (XANAX) 0.25 MG tablet    Sig: Take 1 tablet (0.25 mg total) by mouth 3 (three) times daily as needed for anxiety.    Dispense:  90 tablet    Refill:  2    Order Specific Question:   Supervising Provider    Answer:   Lavera Guise [id number]    Total time spent: 30 Minutes   Time spent includes review of chart, medications, test results, and follow up plan with the patient.      Dr Lavera Guise Internal medicine

## 2020-03-28 ENCOUNTER — Telehealth: Payer: Self-pay

## 2020-03-28 NOTE — Telephone Encounter (Signed)
[place] Patient orders for portable O2. Rachael Blankenship

## 2020-04-05 DIAGNOSIS — M810 Age-related osteoporosis without current pathological fracture: Secondary | ICD-10-CM | POA: Insufficient documentation

## 2020-04-13 ENCOUNTER — Other Ambulatory Visit: Payer: Self-pay | Admitting: Adult Health

## 2020-04-28 ENCOUNTER — Telehealth: Payer: Self-pay

## 2020-04-28 NOTE — Telephone Encounter (Signed)
CMN signed by provider and given to [facility] with [place] Patient.

## 2020-05-12 ENCOUNTER — Ambulatory Visit (INDEPENDENT_AMBULATORY_CARE_PROVIDER_SITE_OTHER): Payer: Medicare Other | Admitting: Nurse Practitioner

## 2020-05-12 ENCOUNTER — Encounter: Payer: Self-pay | Admitting: Nurse Practitioner

## 2020-05-12 VITALS — Temp 98.2°F | Resp 16 | Ht 64.0 in | Wt 125.0 lb

## 2020-05-12 DIAGNOSIS — R0602 Shortness of breath: Secondary | ICD-10-CM

## 2020-05-12 DIAGNOSIS — J441 Chronic obstructive pulmonary disease with (acute) exacerbation: Secondary | ICD-10-CM

## 2020-05-12 DIAGNOSIS — J449 Chronic obstructive pulmonary disease, unspecified: Secondary | ICD-10-CM | POA: Insufficient documentation

## 2020-05-12 MED ORDER — DOXYCYCLINE HYCLATE 100 MG PO TABS: 100.0000 mg | ORAL_TABLET | Freq: Two times a day (BID) | ORAL | 0 refills | Status: DC

## 2020-05-12 MED ORDER — IPRATROPIUM-ALBUTEROL 0.5-2.5 (3) MG/3ML IN SOLN: RESPIRATORY_TRACT | 3 refills | Status: DC

## 2020-05-12 MED ORDER — PREDNISONE 10 MG (21) PO TBPK: ORAL_TABLET | ORAL | 0 refills | Status: DC

## 2020-05-12 NOTE — Progress Notes (Signed)
[facility] [place], [place] [address]  Internal MEDICINE  Telephone Visit  Patient Name: Rachael Blankenship  [id number]  [id number]  Date of Service: 05/12/2020  I connected with the patient at 10:32am by webcam and verified the patients identity using two identifiers.   I discussed the limitations, risks, security and privacy concerns of performing an evaluation and management service by webcam and the availability of in person appointments. I also discussed with the patient that there may be a patient responsible charge related to the service.  The patient expressed understanding and agrees to proceed.    Chief Complaint  Patient presents with  . Nasal Congestion    Dry mouth, no sore throat; is coughing     .The patient has been contacted via webcam for follow up visit due to concerns for spread of novel coronavirus. The patient presents for acute visit. Today, she states that her oxygen level has dropped. She is now using her oxygen most of the day and night. She is having cough which is productive at times. She has nebulizer at home which she has been using more often. This has brought her some relief of symptoms. She has had intermittent headache since onset of respiratory symptoms. She has been taking excedrin migraine for this which does help. She denies fever, chills, or body aches. She does not believe she has been exposed to anyone who is positive for COVID 19.       Current Medication: Outpatient Encounter Medications as of 05/12/2020  Medication Sig  . alendronate (FOSAMAX) 70 MG tablet Take 70 mg by mouth once a week. Take with a full glass of water on an empty stomach.  . ALPRAZolam (XANAX) 0.25 MG tablet Take 1 tablet (0.25 mg total) by mouth 3 (three) times daily as needed for anxiety.  Marland Kitchen ascorbic acid (VITAMIN C) 500 MG tablet Take 500 mg by mouth daily.  Marland Kitchen aspirin 81 MG tablet Take 81 mg by mouth at bedtime.   . Cholecalciferol (VITAMIN D-1000  MAX ST) 1000 units tablet Take 1,000 Units by mouth daily.   . citalopram (CELEXA) 40 MG tablet Take 0.5 tablets (20 mg total) by mouth daily.  . Coenzyme Q10 (CO Q-10) 100 MG CAPS Take 200 mg by mouth daily.   . COMBIVENT RESPIMAT 20-100 MCG/ACT AERS respimat INHALE 1 PUFF INTO THE LUNGS EVERY 6 HOURS AS NEEDED FOR WHEEZING OR SHORTNESS OF BREATH  . DULERA 200-5 MCG/ACT AERO INHALE 1 PUFF BY MOUTH TWICE DAILY  . furosemide (LASIX) 40 MG tablet TAKE 1 TABLET(40 MG) BY MOUTH DAILY  . ibuprofen (ADVIL,MOTRIN) 800 MG tablet Take 800 mg by mouth every 8 (eight) hours as needed for mild pain.   . Magnesium 400 MG TABS Take 400 mg by mouth daily.   . mometasone (NASONEX) 50 MCG/ACT nasal spray Place 2 sprays into the nose daily.  . montelukast (SINGULAIR) 10 MG tablet TAKE 1 TABLET BY MOUTH EVERY DAY FOR RHINITIS OR ALLERGIES  . omeprazole (PRILOSEC) 40 MG capsule Take 1 capsule (40 mg total) by mouth 2 (two) times daily.  . OXYGEN Inhale into the lungs. Oxygen at night, 2 liters  . Potassium Chloride CR (MICRO-K) 8 MEQ CPCR capsule CR TAKE 1 CAPSULE BY MOUTH DAILY. MAY TAKE EXTRA 2-3 TIMES A WEEK  . simvastatin (ZOCOR) 20 MG tablet TAKE 1 TABLET(20 MG) BY MOUTH EVERY EVENING FOR CHOLESTEROL  . SPIRIVA HANDIHALER 18 MCG inhalation capsule INHALE CONTENTS OF 1 CAPSULE ONCE  DAILY USING HANDIHALER  . sucralfate (CARAFATE) 1 g tablet TAKE 1 TABLET BY MOUTH FOUR TIMES DAILY(WITH MEALS AND AT BEDTIME)  . vitamin B-12 (CYANOCOBALAMIN) 1000 MCG tablet Take 1,000 mcg by mouth daily.  . [DISCONTINUED] doxycycline (VIBRA-TABS) 100 MG tablet Take 1 tablet (100 mg total) by mouth 2 (two) times daily.  . [DISCONTINUED] ipratropium-albuterol (DUONEB) 0.5-2.5 (3) MG/3ML SOLN Use 1 vial via nebulizer every 4 t0 6 hrs  . doxycycline (VIBRA-TABS) 100 MG tablet Take 1 tablet (100 mg total) by mouth 2 (two) times daily.  Marland Kitchen ipratropium-albuterol (DUONEB) 0.5-2.5 (3) MG/3ML SOLN Use 1 vial via nebulizer every 4 t0 6 hrs  .  predniSONE (STERAPRED UNI-PAK 21 TAB) 10 MG (21) TBPK tablet 6 day taper - take by mouth as directed for 6 days   No facility-administered encounter medications on file as of 05/12/2020.    Surgical History: Past Surgical History:  Procedure Laterality Date  . ESOPHAGOGASTRODUODENOSCOPY N/A 07/29/2018   Procedure: ESOPHAGOGASTRODUODENOSCOPY (EGD);  Surgeon: Toledo, Benay Pike, MD;  Location: [facility] ENDOSCOPY;  Service: Gastroenterology;  Laterality: N/A;  . KIDNEY STONE SURGERY    . TONSILLECTOMY      Medical History: Past Medical History:  Diagnosis Date  . COPD (chronic obstructive pulmonary disease) ([place])   . Endometriosis   . GERD (gastroesophageal reflux disease)   . Hyperlipidemia   . Kidney stones     Family History: Family History  Problem Relation Age of Onset  . Hypertension Mother   . Hyperlipidemia Mother   . GER disease Mother   . Heart attack Father        23 yrs ago  . Hypertension Father   . Parkinson's disease Father   . GER disease Father     Social History   Socioeconomic History  . Marital status: Married    Spouse name: Not on file  . Number of children: Not on file  . Years of education: Not on file  . Highest education level: Not on file  Occupational History  . Not on file  Tobacco Use  . Smoking status: Current Every Day Smoker    Packs/day: 1.00    Types: Cigarettes    Start date: 11/09/2017  . Smokeless tobacco: Never Used  . Tobacco comment: 1 pack a day   Vaping Use  . Vaping Use: Former  Substance and Sexual Activity  . Alcohol use: Yes    Comment: ocassionally   . Drug use: No  . Sexual activity: Not on file  Other Topics Concern  . Not on file  Social History Narrative  . Not on file   Social Determinants of Health   Financial Resource Strain:   . Difficulty of Paying Living Expenses:   Food Insecurity:   . Worried About Charity fundraiser in the Last Year:   . Arboriculturist in the Last Year:   Transportation Needs:    . Film/video editor (Medical):   Marland Kitchen Lack of Transportation (Non-Medical):   Physical Activity:   . Days of Exercise per Week:   . Minutes of Exercise per Session:   Stress:   . Feeling of Stress :   Social Connections:   . Frequency of Communication with Friends and Family:   . Frequency of Social Gatherings with Friends and Family:   . Attends Religious Services:   . Active Member of Clubs or Organizations:   . Attends Archivist Meetings:   Marland Kitchen Marital Status:  Intimate Partner Violence:   . Fear of Current or Ex-Partner:   . Emotionally Abused:   Marland Kitchen Physically Abused:   . Sexually Abused:       Review of Systems  Constitutional: Positive for fatigue. Negative for activity change and fever.  HENT: Positive for congestion, postnasal drip, rhinorrhea and sore throat.   Respiratory: Positive for cough, shortness of breath and wheezing.   Cardiovascular: Negative for chest pain and palpitations.  Gastrointestinal: Negative for constipation, diarrhea, nausea and vomiting.  Allergic/Immunologic: Positive for environmental allergies.  Neurological: Positive for headaches.  Hematological: Positive for adenopathy.  Psychiatric/Behavioral: The patient is nervous/anxious.     Today's Vitals   05/12/20 1016  Resp: 16  Temp: 98.2 F (36.8 C)  Weight: 125 lb (56.7 kg)  Height: 5\' 4"  (1.626 m)   Body mass index is 21.46 kg/m.  Observation/Objective:   The patient is alert and oriented. She is pleasant and answers all questions appropriately. Breathing is non-labored. She is in no acute distress at this time.  The patient sounds congested. She has congested sounding cough.    Assessment/Plan: 1. COPD with acute exacerbation (HCC) Trial of doxycycline 100mg  bid for 10 days. Rest and increase fluids. Add prednisone taper. Take as directed for 6 days. Use nebulizer as needed and as prescribed. Continue all inhalers and respiratory medications as prescribed.  -  doxycycline (VIBRA-TABS) 100 MG tablet; Take 1 tablet (100 mg total) by mouth 2 (two) times daily.  Dispense: 20 tablet; Refill: 0 - predniSONE (STERAPRED UNI-PAK 21 TAB) 10 MG (21) TBPK tablet; 6 day taper - take by mouth as directed for 6 days  Dispense: 21 tablet; Refill: 0 - ipratropium-albuterol (DUONEB) 0.5-2.5 (3) MG/3ML SOLN; Use 1 vial via nebulizer every 4 t0 6 hrs  Dispense: 360 mL; Refill: 3  2. Shortness of breath Continue all inhalers and respiratory medications as prescribed.  She should use nasal cannula oxygen as prescribed   General Counseling: Karrigan verbalizes understanding of the findings of today's phone visit and agrees with plan of treatment. I have discussed any further diagnostic evaluation that may be needed or ordered today. We also reviewed her medications today. she has been encouraged to call the office with any questions or concerns that should arise related to todays visit.   This patient was seen by Stella with Dr Lavera Guise as a part of collaborative care agreement  Meds ordered this encounter  Medications  . doxycycline (VIBRA-TABS) 100 MG tablet    Sig: Take 1 tablet (100 mg total) by mouth 2 (two) times daily.    Dispense:  20 tablet    Refill:  0    Order Specific Question:   Supervising Provider    Answer:   Lavera Guise [[id number]]  . predniSONE (STERAPRED UNI-PAK 21 TAB) 10 MG (21) TBPK tablet    Sig: 6 day taper - take by mouth as directed for 6 days    Dispense:  21 tablet    Refill:  0    Order Specific Question:   Supervising Provider    Answer:   Lavera Guise [place]  . ipratropium-albuterol (DUONEB) 0.5-2.5 (3) MG/3ML SOLN    Sig: Use 1 vial via nebulizer every 4 t0 6 hrs    Dispense:  360 mL    Refill:  3    Order Specific Question:   Supervising Provider    Answer:   Lavera Guise [[id number]]    Time spent:  [place] [facility]

## 2020-05-15 ENCOUNTER — Other Ambulatory Visit: Payer: Self-pay | Admitting: Adult Health

## 2020-05-17 ENCOUNTER — Other Ambulatory Visit: Payer: Self-pay

## 2020-05-17 DIAGNOSIS — K219 Gastro-esophageal reflux disease without esophagitis: Secondary | ICD-10-CM

## 2020-05-17 MED ORDER — OMEPRAZOLE 40 MG PO CPDR: 40.0000 mg | DELAYED_RELEASE_CAPSULE | Freq: Two times a day (BID) | ORAL | 1 refills | Status: DC

## 2020-05-19 ENCOUNTER — Ambulatory Visit: Payer: Medicare Other | Admitting: Nurse Practitioner

## 2020-06-14 ENCOUNTER — Other Ambulatory Visit: Payer: Self-pay | Admitting: Adult Health

## 2020-06-16 ENCOUNTER — Other Ambulatory Visit: Payer: Self-pay | Admitting: Adult Health

## 2020-06-22 ENCOUNTER — Telehealth: Payer: Self-pay

## 2020-06-22 NOTE — Telephone Encounter (Signed)
Confirmed and screened for 06-26-20 ov. °

## 2020-06-26 ENCOUNTER — Telehealth: Payer: Self-pay

## 2020-06-26 ENCOUNTER — Ambulatory Visit: Payer: Medicare Other | Admitting: Nurse Practitioner

## 2020-06-26 NOTE — Telephone Encounter (Signed)
Patient rescheduled appointment on 06/26/2020 to 08/29/2020. klh

## 2020-07-18 ENCOUNTER — Other Ambulatory Visit: Payer: Self-pay

## 2020-07-18 MED ORDER — SIMVASTATIN 20 MG PO TABS: ORAL_TABLET | ORAL | 0 refills | Status: DC

## 2020-07-18 MED ORDER — FUROSEMIDE 40 MG PO TABS: 40.0000 mg | ORAL_TABLET | Freq: Every day | ORAL | 2 refills | Status: DC

## 2020-08-03 ENCOUNTER — Other Ambulatory Visit: Payer: Self-pay

## 2020-08-03 ENCOUNTER — Encounter: Payer: Self-pay | Admitting: Internal Medicine

## 2020-08-03 ENCOUNTER — Ambulatory Visit (INDEPENDENT_AMBULATORY_CARE_PROVIDER_SITE_OTHER): Payer: Medicare Other | Admitting: Internal Medicine

## 2020-08-03 VITALS — BP 124/72 | HR 79 | Temp 97.1°F | Resp 16 | Ht 64.0 in | Wt 130.0 lb

## 2020-08-03 DIAGNOSIS — R0602 Shortness of breath: Secondary | ICD-10-CM | POA: Diagnosis not present

## 2020-08-03 DIAGNOSIS — I272 Pulmonary hypertension, unspecified: Secondary | ICD-10-CM | POA: Diagnosis not present

## 2020-08-03 DIAGNOSIS — K219 Gastro-esophageal reflux disease without esophagitis: Secondary | ICD-10-CM

## 2020-08-03 DIAGNOSIS — J449 Chronic obstructive pulmonary disease, unspecified: Secondary | ICD-10-CM

## 2020-08-03 DIAGNOSIS — J4489 Other specified chronic obstructive pulmonary disease: Secondary | ICD-10-CM

## 2020-08-03 MED ORDER — DALIRESP 250 MCG PO TABS: 1.0000 | ORAL_TABLET | Freq: Every day | ORAL | 4 refills | Status: DC

## 2020-08-03 NOTE — Progress Notes (Signed)
[facility] [place], [place] [address]  Pulmonary Sleep Medicine   Office Visit Note  Patient Name: Rachael Blankenship  DOB: 1956/01/26 MRN [id number]  Date of Service: 08/03/2020  Complaints/HPI: She continues to smoke unfortunately. Over the course of the last 2 years she has had a steady decline in her FEV1. She and I have discussed lung transplant but she continues to smoke. No admissions. She has not had a covid vaccine. She is not convinced on it. Patient is smoking a pack a day. She is on oxygen does not use it all the time.   ROS  General: (-) fever, (-) chills, (-) night sweats, (-) weakness Skin: (-) rashes, (-) itching,. Eyes: (-) visual changes, (-) redness, (-) itching. Nose and Sinuses: (-) nasal stuffiness or itchiness, (-) postnasal drip, (-) nosebleeds, (-) sinus trouble. Mouth and Throat: (-) sore throat, (-) hoarseness. Neck: (-) swollen glands, (-) enlarged thyroid, (-) neck pain. Respiratory: + cough, (-) bloody sputum, + shortness of breath, - wheezing. Cardiovascular: - ankle swelling, (-) chest pain. Lymphatic: (-) lymph node enlargement. Neurologic: (-) numbness, (-) tingling. Psychiatric: (-) anxiety, (-) depression   Current Medication: Outpatient Encounter Medications as of 08/03/2020  Medication Sig  . alendronate (FOSAMAX) 70 MG tablet Take 70 mg by mouth once a week. Take with a full glass of water on an empty stomach.  . ALPRAZolam (XANAX) 0.25 MG tablet Take 1 tablet (0.25 mg total) by mouth 3 (three) times daily as needed for anxiety.  Marland Kitchen ascorbic acid (VITAMIN C) 500 MG tablet Take 500 mg by mouth daily.  Marland Kitchen aspirin 81 MG tablet Take 81 mg by mouth at bedtime.   . Cholecalciferol (VITAMIN D-1000 MAX ST) 1000 units tablet Take 1,000 Units by mouth daily.   . citalopram (CELEXA) 40 MG tablet Take 0.5 tablets (20 mg total) by mouth daily.  . Coenzyme Q10 (CO Q-10) 100 MG CAPS Take 200 mg by mouth daily.   . COMBIVENT RESPIMAT  20-100 MCG/ACT AERS respimat INHALE 1 PUFF INTO THE LUNGS EVERY 6 HOURS AS NEEDED FOR WHEEZING OR SHORTNESS OF BREATH  . DULERA 200-5 MCG/ACT AERO INHALE 1 PUFF BY MOUTH TWICE DAILY  . furosemide (LASIX) 40 MG tablet Take 1 tablet (40 mg total) by mouth daily.  Marland Kitchen ibuprofen (ADVIL,MOTRIN) 800 MG tablet Take 800 mg by mouth every 8 (eight) hours as needed for mild pain.   Marland Kitchen ipratropium-albuterol (DUONEB) 0.5-2.5 (3) MG/3ML SOLN Use 1 vial via nebulizer every 4 t0 6 hrs  . Magnesium 400 MG TABS Take 400 mg by mouth daily.   . mometasone (NASONEX) 50 MCG/ACT nasal spray Place 2 sprays into the nose daily.  . montelukast (SINGULAIR) 10 MG tablet TAKE 1 TABLET BY MOUTH EVERY DAY FOR RHINITIS OR ALLERGIES  . omeprazole (PRILOSEC) 40 MG capsule Take 1 capsule (40 mg total) by mouth 2 (two) times daily.  . OXYGEN Inhale into the lungs. Oxygen at night, 2 liters  . Potassium Chloride CR (MICRO-K) 8 MEQ CPCR capsule CR TAKE 1 CAPSULE BY MOUTH DAILY. MAY TAKE EXTRA 2-3 TIMES A WEEK  . simvastatin (ZOCOR) 20 MG tablet TAKE 1 TABLET(20 MG) BY MOUTH EVERY EVENING FOR CHOLESTEROL  . SPIRIVA HANDIHALER 18 MCG inhalation capsule INHALE CONTENTS OF 1 CAPSULE ONCE DAILY USING HANDIHALER  . sucralfate (CARAFATE) 1 g tablet TAKE 1 TABLET BY MOUTH FOUR TIMES DAILY(WITH MEALS AND AT BEDTIME)  . vitamin B-12 (CYANOCOBALAMIN) 1000 MCG tablet Take 1,000 mcg by  mouth daily.  . [DISCONTINUED] doxycycline (VIBRA-TABS) 100 MG tablet Take 1 tablet (100 mg total) by mouth 2 (two) times daily. (Patient not taking: Reported on 08/03/2020)  . [DISCONTINUED] predniSONE (STERAPRED UNI-PAK 21 TAB) 10 MG (21) TBPK tablet 6 day taper - take by mouth as directed for 6 days (Patient not taking: Reported on 08/03/2020)   No facility-administered encounter medications on file as of 08/03/2020.    Surgical History: Past Surgical History:  Procedure Laterality Date  . ESOPHAGOGASTRODUODENOSCOPY N/A 07/29/2018   Procedure:  ESOPHAGOGASTRODUODENOSCOPY (EGD);  Surgeon: Toledo, Benay Pike, MD;  Location: [facility] ENDOSCOPY;  Service: Gastroenterology;  Laterality: N/A;  . KIDNEY STONE SURGERY    . TONSILLECTOMY      Medical History: Past Medical History:  Diagnosis Date  . COPD (chronic obstructive pulmonary disease) ([place])   . Endometriosis   . GERD (gastroesophageal reflux disease)   . Hyperlipidemia   . Kidney stones     Family History: Family History  Problem Relation Age of Onset  . Hypertension Mother   . Hyperlipidemia Mother   . GER disease Mother   . Heart attack Father        23 yrs ago  . Hypertension Father   . Parkinson's disease Father   . GER disease Father     Social History: Social History   Socioeconomic History  . Marital status: Married    Spouse name: Not on file  . Number of children: Not on file  . Years of education: Not on file  . Highest education level: Not on file  Occupational History  . Not on file  Tobacco Use  . Smoking status: Current Every Day Smoker    Packs/day: 1.00    Types: Cigarettes    Start date: 11/09/2017  . Smokeless tobacco: Never Used  . Tobacco comment: 1 pack a day   Vaping Use  . Vaping Use: Former  Substance and Sexual Activity  . Alcohol use: Yes    Comment: ocassionally   . Drug use: No  . Sexual activity: Not on file  Other Topics Concern  . Not on file  Social History Narrative  . Not on file   Social Determinants of Health   Financial Resource Strain:   . Difficulty of Paying Living Expenses: Not on file  Food Insecurity:   . Worried About Charity fundraiser in the Last Year: Not on file  . Ran Out of Food in the Last Year: Not on file  Transportation Needs:   . Lack of Transportation (Medical): Not on file  . Lack of Transportation (Non-Medical): Not on file  Physical Activity:   . Days of Exercise per Week: Not on file  . Minutes of Exercise per Session: Not on file  Stress:   . Feeling of Stress : Not on file   Social Connections:   . Frequency of Communication with Friends and Family: Not on file  . Frequency of Social Gatherings with Friends and Family: Not on file  . Attends Religious Services: Not on file  . Active Member of Clubs or Organizations: Not on file  . Attends Archivist Meetings: Not on file  . Marital Status: Not on file  Intimate Partner Violence:   . Fear of Current or Ex-Partner: Not on file  . Emotionally Abused: Not on file  . Physically Abused: Not on file  . Sexually Abused: Not on file    Vital Signs: Blood pressure 124/72, pulse 79, temperature (!)  97.1 F (36.2 C), resp. rate 16, height 5\' 4"  (1.626 m), weight 130 lb (59 kg), SpO2 90 %.  Examination: General Appearance: The patient is well-developed, well-nourished, and in no distress. Skin: Gross inspection of skin unremarkable. Head: normocephalic, no gross deformities. Eyes: no gross deformities noted. ENT: ears appear grossly normal no exudates. Neck: Supple. No thyromegaly. No LAD. Respiratory: few rhonchi noted. Cardiovascular: Normal S1 and S2 without murmur or rub. Extremities: No cyanosis. pulses are equal. Neurologic: Alert and oriented. No involuntary movements.  LABS: No results found for this or any previous visit (from the past 2160 hour(s)).  Radiology: NM Hepato W/EjeCT Fract  Result Date: 08/19/2018 CLINICAL DATA:  Epigastric and upper abdominal pain EXAM: NUCLEAR MEDICINE HEPATOBILIARY IMAGING WITH GALLBLADDER EF VIEWS: Anterior, right lateral right upper quadrant RADIOPHARMACEUTICALS:  5.693 mCi Tc-44m  Choletec IV COMPARISON:  None. FINDINGS: Liver uptake of radiotracer is unremarkable. There is prompt visualization of gallbladder and small bowel, indicating patency of the cystic and common bile ducts. The patient consumed 8 ounces of Ensure orally with calculation of the computer generated ejection fraction of radiotracer from the gallbladder. The patient did not report clinical  symptoms with the oral Ensure consumption. The computer generated ejection fraction of radiotracer from the gallbladder is normal at 54%, normal greater than 33% using the oral agent. IMPRESSION: Study within normal limits. Electronically Signed   By: Lowella Grip III M.D.   On: 08/19/2018 13:59    No results found.  No results found.    Assessment and Plan: Patient Active Problem List   Diagnosis Date Noted  . COPD with acute exacerbation ([place]) 05/12/2020  . Age-related osteoporosis without current pathological fracture 04/05/2020  . Need for vaccination against Streptococcus pneumoniae using pneumococcal conjugate vaccine 13 03/29/2019  . Cigarette smoker 11/26/2018  . Obstructive chronic bronchitis without exacerbation ([place]) 08/25/2018  . Gastroesophageal reflux disease without esophagitis 08/25/2018  . Black stool 08/25/2018  . Flu vaccine need 08/25/2018  . Acute respiratory failure with hypoxemia ([place]) 05/21/2018  . Screening for osteoporosis 05/13/2018  . Acute upper respiratory infection 05/13/2018  . Other fatigue 05/13/2018  . Generalized anxiety disorder 05/13/2018  . Vitamin D deficiency 05/13/2018  . Screening for malignant neoplasm of cervix 05/13/2018  . Dysuria 05/13/2018  . Acute chest pain 01/18/2015  . Shortness of breath 01/18/2015  . Hyperlipemia 03/08/2014  . Pulmonary HTN ([place]) 03/08/2014  . Reactive airway disease 03/08/2014    1. COPD very severe disease basedd on the FEV1. Patient and I disucssed transplant possibly for her. She is currently not a candidated to consider due to smoking 2. Smoker smoking cessation discussed 3. Oxygen dependent. Discussed importance of Oxygen therapy 4. PAH due to severe COPD   General Counseling: I have discussed the findings of the evaluation and examination with Neoma Laming.  I have also discussed any further diagnostic evaluation thatmay be needed or ordered today. Brylyn verbalizes understanding of the findings  of todays visit. We also reviewed her medications today and discussed drug interactions and side effects including but not limited excessive drowsiness and altered mental states. We also discussed that there is always a risk not just to her but also people around her. she has been encouraged to call the office with any questions or concerns that should arise related to todays visit.  Orders Placed This Encounter  Procedures  . Spirometry with Graph    Order Specific Question:   Where should this test be performed?    Answer:  [facility]    Order Specific Question:   Basic spirometry    Answer:   Yes    Order Specific Question:   Spirometry pre & post bronchodilator    Answer:   No     Time spent: 12  I have personally obtained a history, examined the patient, evaluated laboratory and imaging results, formulated the assessment and plan and placed orders.    Allyne Gee, MD [facility] Pulmonary and Critical Care Sleep medicine

## 2020-08-03 NOTE — Patient Instructions (Signed)
Lung Transplant, Adult  Lung transplant is a procedure to replace one or both of the lungs. During this procedure, a donor lung is usually taken from a person who has agreed in advance to donate his or her lungs at the time of death (deceased organ donor). You may need this surgery if you have a long-term (chronic) lung condition that will be life-threatening within a year or two and no other treatments have worked. Conditions that may lead to a lung transplant include:  Chronic obstructive pulmonary disease (COPD).  Cystic fibrosis.  Pulmonary hypertension.  Pulmonary fibrosis.  Sarcoidosis. Tell your health care provider about:  Any allergies you have.  All medicines you are taking, including vitamins, herbs, eye drops, creams, and over-the-counter medicines.  Any problems you or family members have had with anesthetic medicines.  Any blood disorders you have.  Any surgeries you have had.  Any medical conditions you have.  Whether you are pregnant or may be pregnant. What are the risks? The main risk in this surgery is rejection. This happens when your body's defense system (immune system) attacks and damages the new lung. You will take medicines to make your immune system less active in order to prevent rejection. These medicines are called anti-rejection medicines. Other risks of lung transplant surgery include:  Bleeding.  Infection.  A blocked blood vessel that supplies the lung.  A blocked airway that supplies the lung.  Fluid that builds up in the lung.  Allergic reactions to medicines or dyes.  Problems that are caused by long-term use of anti-rejection medicines. These include: ? Infections. ? Cancer. ? Diabetes. ? High blood pressure. ? Kidney damage. ? Weakened bones (osteoporosis). What happens before the surgery? Medicines Ask your health care provider about:  Changing or stopping your regular medicines. This is especially important if you are  taking diabetes medicines or blood thinners.  Taking medicines such as aspirin and ibuprofen. These medicines can thin your blood. Do not take these medicines unless your health care provider tells you to take them.  Taking over-the-counter medicines, vitamins, herbs, and supplements. Tests You may have an exam or testing. This may include:  Heart and lung function tests.  Blood tests.  Imaging tests. Surgery safety Ask your health care provider:  How your surgery site will be marked.  What steps will be taken to help prevent infection. These may include: ? Removing hair at the surgery site. ? Washing skin with a germ-killing soap. ? Taking antibiotic medicine. General instructions  Do not use any products that contain nicotine or tobacco for at least 4 weeks before the procedure. These products include cigarettes, e-cigarettes, and chewing tobacco. If you need help quitting, ask your health care provider.  Stop eating and drinking as soon as you are told to go to the hospital for a possible lung transplant.  Plan to have someone take you home from the hospital.  Plan to have a responsible adult care for you for at least 24 hours after you leave the hospital or clinic. This is important. What happens during the surgery?  An IV will be inserted into one of your veins.  Tubes (catheters) will be placed into the blood vessels in your arm or neck to give fluids and nutrition. Blood samples can also be collected through the catheters during and after the procedure.  You will be given one or both of the following: ? A medicine to help you relax (sedative). ? A medicine to make you fall  asleep (general anesthetic).  A tube will be placed in your windpipe (trachea) to help you breathe.  Your surgeon will make an incision on one side of your chest. If you are having a double lung transplant, the incision may go across and underneath your chest.  Your surgeon will open your  chest.  Your surgeon will cut airways and blood vessels that supply your lung.  Your lung will be removed from your chest.  The donated lung will be placed into your chest and attached to your airways and blood vessels.  A drain or drains will be placed to help your body get rid of any blood or fluid after surgery.  The chest incision will be closed with stitches (sutures) or staples. The procedure may vary among health care providers and hospitals. What happens after the surgery?  Your blood pressure, heart rate, breathing rate, and blood oxygen level will be monitored until you leave the hospital. This will be done in the ICU.  You will have a breathing tube in your throat attached to a ventilator until you can breathe on your own.  You will have a tube that goes down your nose to drain fluid from your stomach (nasogastric tube).  You will be given medicines to: ? Stop the pain. ? Prevent rejection.  You will be moved to a regular hospital room when you can breathe on your own.  You may stay in the hospital for a week or longer.  The tubes and drains will be removed as you recover.  You will have blood tests and breathing tests to make sure that your new lung is working and is not being rejected.  You will start pulmonary rehabilitation. This includes exercises, education, and counseling about living with a lung transplant.  You will learn about side effects from your anti-rejection medicines and how to protect yourself from infection. Summary  Lung transplant is a procedure to replace one or both of the lungs.  You may need this surgery if you have a long-term (chronic) lung condition that will threaten your life and no other treatments have helped.  You will receive a healthy lung from a donor who has died.  During the procedure, your surgeon will remove your lung and attach the transplant lung to the airways and blood vessels inside your chest.  Rejection is the main  risk of this procedure. You will need to take anti-rejection medicines for the rest of your life. This information is not intended to replace advice given to you by your health care provider. Make sure you discuss any questions you have with your health care provider. Document Revised: 09/01/2019 Document Reviewed: 09/01/2019 Elsevier Patient Education  [place] with Quitting Smoking  Quitting smoking is a physical and mental challenge. You will face cravings, withdrawal symptoms, and temptation. Before quitting, work with your health care provider to make a plan that can help you cope. Preparation can help you quit and keep you from giving in. How can I cope with cravings? Cravings usually last for 5-10 minutes. If you get through it, the craving will pass. Consider taking the following actions to help you cope with cravings:  Keep your mouth busy: ? Chew sugar-free gum. ? Suck on hard candies or a straw. ? Brush your teeth.  Keep your hands and body busy: ? Immediately change to a different activity when you feel a craving. ? Squeeze or play with a ball. ? Do an activity or a hobby,  like making bead jewelry, practicing needlepoint, or working with wood. ? Mix up your normal routine. ? Take a short exercise break. Go for a quick walk or run up and down stairs. ? Spend time in public places where smoking is not allowed.  Focus on doing something kind or helpful for someone else.  Call a friend or family member to talk during a craving.  Join a support group.  Call a quit line, such as 1-800-QUIT-NOW.  Talk with your health care provider about medicines that might help you cope with cravings and make quitting easier for you. How can I deal with withdrawal symptoms? Your body may experience negative effects as it tries to get used to not having nicotine in the system. These effects are called withdrawal symptoms. They may include:  Feeling hungrier than  normal.  Trouble concentrating.  Irritability.  Trouble sleeping.  Feeling depressed.  Restlessness and agitation.  Craving a cigarette. To manage withdrawal symptoms:  Avoid places, people, and activities that trigger your cravings.  Remember why you want to quit.  Get plenty of sleep.  Avoid coffee and other caffeinated drinks. These may worsen some of your symptoms. How can I handle social situations? Social situations can be difficult when you are quitting smoking, especially in the first few weeks. To manage this, you can:  Avoid parties, bars, and other social situations where people might be smoking.  Avoid alcohol.  Leave right away if you have the urge to smoke.  Explain to your family and friends that you are quitting smoking. Ask for understanding and support.  Plan activities with friends or family where smoking is not an option. What are some ways I can cope with stress? Wanting to smoke may cause stress, and stress can make you want to smoke. Find ways to manage your stress. Relaxation techniques can help. For example:  Breathe slowly and deeply, in through your nose and out through your mouth.  Listen to soothing, relaxing music.  Talk with a family member or friend about your stress.  Light a candle.  Soak in a bath or take a shower.  Think about a peaceful place. What are some ways I can prevent weight gain? Be aware that many people gain weight after they quit smoking. However, not everyone does. To keep from gaining weight, have a plan in place before you quit and stick to the plan after you quit. Your plan should include:  Having healthy snacks. When you have a craving, it may help to: ? Eat plain popcorn, crunchy carrots, celery, or other cut vegetables. ? Chew sugar-free gum.  Changing how you eat: ? Eat small portion sizes at meals. ? Eat 4-6 small meals throughout the day instead of 1-2 large meals a day. ? Be mindful when you eat. Do  not watch television or do other things that might distract you as you eat.  Exercising regularly: ? Make time to exercise each day. If you do not have time for a long workout, do short bouts of exercise for 5-10 minutes several times a day. ? Do some form of strengthening exercise, like weight lifting, and some form of aerobic exercise, like running or swimming.  Drinking plenty of water or other low-calorie or no-calorie drinks. Drink 6-8 glasses of water daily, or as much as instructed by your health care provider. Summary  Quitting smoking is a physical and mental challenge. You will face cravings, withdrawal symptoms, and temptation to smoke again. Preparation can help  you as you go through these challenges.  You can cope with cravings by keeping your mouth busy (such as by chewing gum), keeping your body and hands busy, and making calls to family, friends, or a helpline for people who want to quit smoking.  You can cope with withdrawal symptoms by avoiding places where people smoke, avoiding drinks with caffeine, and getting plenty of rest.  Ask your health care provider about the different ways to prevent weight gain, avoid stress, and handle social situations. This information is not intended to replace advice given to you by your health care provider. Make sure you discuss any questions you have with your health care provider. Document Revised: 10/24/2017 Document Reviewed: 11/08/2016 Elsevier Patient Education  2020 [employer].

## 2020-08-29 ENCOUNTER — Encounter: Payer: Self-pay | Admitting: Nurse Practitioner

## 2020-08-29 ENCOUNTER — Ambulatory Visit (INDEPENDENT_AMBULATORY_CARE_PROVIDER_SITE_OTHER): Payer: Medicare Other | Admitting: Nurse Practitioner

## 2020-08-29 VITALS — BP 102/60 | HR 84 | Temp 97.6°F | Resp 16 | Ht 64.0 in | Wt 128.4 lb

## 2020-08-29 DIAGNOSIS — R102 Pelvic and perineal pain: Secondary | ICD-10-CM | POA: Diagnosis not present

## 2020-08-29 DIAGNOSIS — Z13820 Encounter for screening for osteoporosis: Secondary | ICD-10-CM

## 2020-08-29 DIAGNOSIS — R079 Chest pain, unspecified: Secondary | ICD-10-CM

## 2020-08-29 DIAGNOSIS — J4489 Other specified chronic obstructive pulmonary disease: Secondary | ICD-10-CM

## 2020-08-29 DIAGNOSIS — Z1231 Encounter for screening mammogram for malignant neoplasm of breast: Secondary | ICD-10-CM | POA: Diagnosis not present

## 2020-08-29 DIAGNOSIS — Z0001 Encounter for general adult medical examination with abnormal findings: Secondary | ICD-10-CM

## 2020-08-29 DIAGNOSIS — R1024 Suprapubic pain: Secondary | ICD-10-CM

## 2020-08-29 DIAGNOSIS — Z23 Encounter for immunization: Secondary | ICD-10-CM

## 2020-08-29 DIAGNOSIS — J449 Chronic obstructive pulmonary disease, unspecified: Secondary | ICD-10-CM

## 2020-08-29 DIAGNOSIS — R300 Dysuria: Secondary | ICD-10-CM | POA: Diagnosis not present

## 2020-08-29 MED ORDER — PNEUMOVAX 23 25 MCG/0.5ML IJ INJ: INJECTION | INTRAMUSCULAR | 0 refills | Status: DC

## 2020-08-29 NOTE — Progress Notes (Signed)
[facility] [place], [place] [address]  Internal MEDICINE  Office Visit Note  Patient Name: Rachael Blankenship  [id number]  [id number]  Date of Service: 09/17/2020   Pt is here for routine health maintenance examination  Chief Complaint  Patient presents with  . Medicare Wellness  . Gastroesophageal Reflux  . Hyperlipidemia  . Oral Swelling    knot on right side of throat  . Abdominal Pain    sharp pain  . other    controlled substance form given to PT  . Quality Metric Gaps    mammogram,tetnaus,flu     The patient is here for health maintenance exam. Today, she states that she has been having more frequent episodes of shortness of breath, especially with exertion. She states that she has intermittent episodes of chest discomfort as well. An ECG shows non specific t wave abnormalities, consistent with pulmonary disease. The patient does have history of COPD. This is managed with dulera twice daily and rescue inhaler and/or nebulizer treatments as needed. She reports more frequent use of rescue treatments.  She also states that she is having suprapubic abdominal pain. This is intermittent. She describes the pain as sharp and stabbing in nature. She denies vaginal discharge or bleeding. Denies nausea, vomiting, or diarrhea.  She is due to have routine, fasting labs done. She should also have a screening mammogram. She is also due to have pnuemovax vaccine.     Current Medication: Outpatient Encounter Medications as of 08/29/2020  Medication Sig  . alendronate (FOSAMAX) 70 MG tablet Take 70 mg by mouth once a week. Take with a full glass of water on an empty stomach.  . ALPRAZolam (XANAX) 0.25 MG tablet Take 1 tablet (0.25 mg total) by mouth 3 (three) times daily as needed for anxiety.  Marland Kitchen ascorbic acid (VITAMIN C) 500 MG tablet Take 500 mg by mouth daily.  Marland Kitchen aspirin 81 MG tablet Take 81 mg by mouth at bedtime.   . Cholecalciferol (VITAMIN D-1000 MAX ST)  1000 units tablet Take 1,000 Units by mouth daily.   . citalopram (CELEXA) 40 MG tablet Take 0.5 tablets (20 mg total) by mouth daily.  . Coenzyme Q10 (CO Q-10) 100 MG CAPS Take 200 mg by mouth daily.   . COMBIVENT RESPIMAT 20-100 MCG/ACT AERS respimat INHALE 1 PUFF INTO THE LUNGS EVERY 6 HOURS AS NEEDED FOR WHEEZING OR SHORTNESS OF BREATH  . DULERA 200-5 MCG/ACT AERO INHALE 1 PUFF BY MOUTH TWICE DAILY  . furosemide (LASIX) 40 MG tablet Take 1 tablet (40 mg total) by mouth daily.  Marland Kitchen ibuprofen (ADVIL,MOTRIN) 800 MG tablet Take 800 mg by mouth every 8 (eight) hours as needed for mild pain.   Marland Kitchen ipratropium-albuterol (DUONEB) 0.5-2.5 (3) MG/3ML SOLN Use 1 vial via nebulizer every 4 t0 6 hrs  . Magnesium 400 MG TABS Take 400 mg by mouth daily.   . mometasone (NASONEX) 50 MCG/ACT nasal spray Place 2 sprays into the nose daily.  . montelukast (SINGULAIR) 10 MG tablet TAKE 1 TABLET BY MOUTH EVERY DAY FOR RHINITIS OR ALLERGIES  . omeprazole (PRILOSEC) 40 MG capsule Take 1 capsule (40 mg total) by mouth 2 (two) times daily.  . OXYGEN Inhale into the lungs. Oxygen at night, 2 liters  . Potassium Chloride CR (MICRO-K) 8 MEQ CPCR capsule CR TAKE 1 CAPSULE BY MOUTH DAILY. MAY TAKE EXTRA 2-3 TIMES A WEEK  . Roflumilast (DALIRESP) 250 MCG TABS Take 1 tablet by mouth daily.  Marland Kitchen  simvastatin (ZOCOR) 20 MG tablet TAKE 1 TABLET(20 MG) BY MOUTH EVERY EVENING FOR CHOLESTEROL  . SPIRIVA HANDIHALER 18 MCG inhalation capsule INHALE CONTENTS OF 1 CAPSULE ONCE DAILY USING HANDIHALER  . sucralfate (CARAFATE) 1 g tablet TAKE 1 TABLET BY MOUTH FOUR TIMES DAILY(WITH MEALS AND AT BEDTIME)  . vitamin B-12 (CYANOCOBALAMIN) 1000 MCG tablet Take 1,000 mcg by mouth daily.  . pneumococcal 23 valent vaccine (PNEUMOVAX 23) 25 MCG/0.5ML injection Inject 0.40m IM once   No facility-administered encounter medications on file as of 08/29/2020.    Surgical History: Past Surgical History:  Procedure Laterality Date  .  ESOPHAGOGASTRODUODENOSCOPY N/A 07/29/2018   Procedure: ESOPHAGOGASTRODUODENOSCOPY (EGD);  Surgeon: Tol[place], TBenay Pike MD;  Location: [facility] ENDOSCOPY;  Service: Gastroenterology;  Laterality: N/A;  . KIDNEY STONE SURGERY    . TONSILLECTOMY      Medical History: Past Medical History:  Diagnosis Date  . COPD (chronic obstructive pulmonary disease) ([place]   . Endometriosis   . GERD (gastroesophageal reflux disease)   . Hyperlipidemia   . Kidney stones     Family History: Family History  Problem Relation Age of Onset  . Hypertension Mother   . Hyperlipidemia Mother   . GER disease Mother   . Heart attack Father        23 yrs ago  . Hypertension Father   . Parkinson's disease Father   . GER disease Father       Review of Systems  Constitutional: Negative for activity change, chills, fatigue and unexpected weight change.  HENT: Negative for congestion, postnasal drip, rhinorrhea, sneezing and sore throat.   Respiratory: Positive for chest tightness, shortness of breath and wheezing. Negative for cough.   Cardiovascular: Positive for chest pain. Negative for palpitations and leg swelling.  Gastrointestinal: Positive for abdominal pain. Negative for constipation, diarrhea, nausea and vomiting.       Pain described as suprapubic and intermittent. Stabbing in nature.   Endocrine: Negative for cold intolerance, heat intolerance, polydipsia and polyuria.  Genitourinary: Positive for pelvic pain. Negative for dysuria, frequency, vaginal bleeding and vaginal discharge.  Musculoskeletal: Negative for arthralgias, back pain, joint swelling and neck pain.  Skin: Negative for rash.  Allergic/Immunologic: Positive for environmental allergies.  Neurological: Negative for dizziness, tremors, numbness and headaches.  Hematological: Negative for adenopathy. Does not bruise/bleed easily.  Psychiatric/Behavioral: Negative for behavioral problems (Depression), sleep disturbance and suicidal ideas. The  patient is nervous/anxious.     Today's Vitals   08/29/20 1513  BP: 102/60  Pulse: 84  Resp: 16  Temp: 97.6 F (36.4 C)  SpO2: 90%  Weight: 128 lb 6.4 oz (58.2 kg)  Height: 5' 4"  (1.626 m)   Body mass index is 22.04 kg/m.  Physical Exam Vitals and nursing note reviewed.  Constitutional:      General: She is not in acute distress.    Appearance: She is well-developed. She is not diaphoretic.  HENT:     Head: Normocephalic and atraumatic.     Mouth/Throat:     Pharynx: No oropharyngeal exudate.  Eyes:     Pupils: Pupils are equal, round, and reactive to light.  Neck:     Thyroid: No thyromegaly.     Vascular: No carotid bruit or JVD.     Trachea: No tracheal deviation.  Cardiovascular:     Rate and Rhythm: Normal rate and regular rhythm.     Pulses: Normal pulses.     Heart sounds: Normal heart sounds. No murmur heard.  No friction  rub. No gallop.      Comments: ECG showing non specific t wave abnormalities, consistent with pulmonary disease.  Pulmonary:     Effort: Pulmonary effort is normal. No respiratory distress.     Breath sounds: Normal breath sounds. No wheezing or rales.  Chest:     Chest wall: No tenderness.  Abdominal:     General: Bowel sounds are normal.     Palpations: Abdomen is soft.     Tenderness: There is abdominal tenderness.     Comments: Mild, suprapubic pain with palpation.   Musculoskeletal:        General: Normal range of motion.     Cervical back: Normal range of motion and neck supple.  Lymphadenopathy:     Cervical: No cervical adenopathy.  Skin:    General: Skin is warm and dry.  Neurological:     General: No focal deficit present.     Mental Status: She is alert and oriented to person, place, and time.     Cranial Nerves: No cranial nerve deficit.  Psychiatric:        Mood and Affect: Mood normal.        Behavior: Behavior normal.        Thought Content: Thought content normal.        Judgment: Judgment normal.     Depression screen [facility] 2/9 08/29/2020 03/21/2020 12/20/2019 10/20/2019 03/15/2019  Decreased Interest 0 0 0 0 0  Down, Depressed, Hopeless 0 1 0 0 0  PHQ - 2 Score 0 1 0 0 0  Altered sleeping - - - - -  Tired, decreased energy - - - - -  Change in appetite - - - - -  Feeling bad or failure about yourself  - - - - -  Trouble concentrating - - - - -  Moving slowly or fidgety/restless - - - - -  Suicidal thoughts - - - - -  PHQ-9 Score - - - - -    Functional Status Survey: Is the patient deaf or have difficulty hearing?: Yes Does the patient have difficulty seeing, even when wearing glasses/contacts?: No Does the patient have difficulty concentrating, remembering, or making decisions?: Yes Does the patient have difficulty walking or climbing stairs?: Yes Does the patient have difficulty dressing or bathing?: No Does the patient have difficulty doing errands alone such as visiting a doctor's office or shopping?: Yes  MMSE - [place] Exam 08/29/2020 03/29/2019  Orientation to time 5 5  Orientation to Place 5 5  Registration 3 3  Attention/ Calculation 5 5  Recall 3 3  Language- name 2 objects 2 2  Language- repeat 1 1  Language- follow 3 step command 3 3  Language- read & follow direction 1 1  Write a sentence 1 1  Copy design 1 1  Total score 30 30    Fall Risk  08/29/2020 03/21/2020 12/20/2019 10/20/2019 03/29/2019  Falls in the past year? 0 0 0 0 0  Number falls in past yr: - - - - -  Injury with Fall? - - - - -  Risk for fall due to : - - - - -      LABS: Recent Results (from the past 2160 hour(s))  UA/M w/rflx Culture, Routine     Status: None   Collection Time: 08/29/20  4:00 PM   Specimen: Urine   Urine  Result Value Ref Range   Specific Gravity, UA 1.007 1.005 - 1.030  pH, UA 6.5 5.0 - 7.5   Color, UA Yellow Yellow   Appearance Ur Clear Clear   Leukocytes,UA Negative Negative   Protein,UA Negative Negative/Trace   Glucose, UA Negative Negative    Ketones, UA Negative Negative   RBC, UA Negative Negative   Bilirubin, UA Negative Negative   Urobilinogen, Ur 0.2 0.2 - 1.0 mg/dL   Nitrite, UA Negative Negative   Microscopic Examination Comment     Comment: Microscopic follows if indicated.   Microscopic Examination See below:     Comment: Microscopic was indicated and was performed.   Urinalysis Reflex Comment     Comment: This specimen will not reflex to a Urine Culture.  Microscopic Examination     Status: None   Collection Time: 08/29/20  4:00 PM   Urine  Result Value Ref Range   WBC, UA None seen 0 - 5 /hpf   RBC None seen 0 - 2 /hpf   Epithelial Cells (non renal) None seen 0 - 10 /hpf   Casts None seen None seen /lpf   Bacteria, UA None seen None seen/Few  Comprehensive metabolic panel     Status: Abnormal   Collection Time: 09/05/20 10:32 AM  Result Value Ref Range   Glucose 100 (H) 65 - 99 mg/dL   BUN 12 8 - 27 mg/dL   Creatinine, Ser 0.64 0.57 - 1.00 mg/dL   GFR calc non Af Amer 93 >59 mL/min/1.73   GFR calc Af Amer 108 >59 mL/min/1.73    Comment: **[facility] currently reports eGFR in compliance with the current**   recommendations of the [employer]. [facility] will   update reporting as new guidelines are published from the NKF-ASN   Task force.    BUN/Creatinine Ratio 19 12 - 28   Sodium 141 134 - 144 mmol/L   Potassium 4.4 3.5 - 5.2 mmol/L   Chloride 100 96 - 106 mmol/L   CO2 30 (H) 20 - 29 mmol/L   Calcium 9.3 8.7 - 10.3 mg/dL   Total Protein 6.3 6.0 - 8.5 g/dL   Albumin 4.3 3.8 - 4.8 g/dL   Globulin, Total 2.0 1.5 - 4.5 g/dL   Albumin/Globulin Ratio 2.2 1.2 - 2.2   Bilirubin Total 0.3 0.0 - 1.2 mg/dL   Alkaline Phosphatase 102 44 - 121 IU/L    Comment:               **Please note reference interval change**   AST 20 0 - 40 IU/L   ALT 20 0 - 32 IU/L  CBC     Status: None   Collection Time: 09/05/20 10:32 AM  Result Value Ref Range   WBC 6.9 3.4 - 10.8 x10E3/uL   RBC 5.27 3.77 - 5.28  x10E6/uL   Hemoglobin 14.6 11.1 - 15.9 g/dL   Hematocrit 44.5 34.0 - 46.6 %   MCV 84 79 - 97 fL   MCH 27.7 26.6 - 33.0 pg   MCHC 32.8 31 - 35 g/dL   RDW 13.6 11.7 - 15.4 %   Platelets 283 150 - 450 x10E3/uL  Lipid Panel With LDL/HDL Ratio     Status: Abnormal   Collection Time: 09/05/20 10:32 AM  Result Value Ref Range   Cholesterol, Total 177 100 - 199 mg/dL   Triglycerides 77 0 - 149 mg/dL   HDL 61 >39 mg/dL   VLDL Cholesterol Cal 14 5 - 40 mg/dL   LDL Chol Calc (NIH) 102 (H) 0 - 99 mg/dL  LDL/HDL Ratio 1.7 0.0 - 3.2 ratio    Comment:                                     LDL/HDL Ratio                                             Men  Women                               1/2 Avg.Risk  1.0    1.5                                   Avg.Risk  3.6    3.2                                2X Avg.Risk  6.2    5.0                                3X Avg.Risk  8.0    6.1   Iron and TIBC     Status: None   Collection Time: 09/05/20 10:32 AM  Result Value Ref Range   Total Iron Binding Capacity 312 250 - 450 ug/dL   UIBC 262 118 - 369 ug/dL   Iron 50 27 - 139 ug/dL   Iron Saturation 16 15 - 55 %  B12 and Folate Panel     Status: None   Collection Time: 09/05/20 10:32 AM  Result Value Ref Range   Vitamin B-12 697 232 - 1,245 pg/mL   Folate 18.2 >3.0 ng/mL    Comment: A serum folate concentration of less than 3.1 ng/mL is considered to represent clinical deficiency.   HCV Ab w Reflex to Quant PCR     Status: None   Collection Time: 09/05/20 10:32 AM  Result Value Ref Range   HCV Ab <0.1 0.0 - 0.9 s/co ratio  T4, free     Status: None   Collection Time: 09/05/20 10:32 AM  Result Value Ref Range   Free T4 1.25 0.82 - 1.77 ng/dL  TSH     Status: None   Collection Time: 09/05/20 10:32 AM  Result Value Ref Range   TSH 0.911 0.450 - 4.500 uIU/mL  VITAMIN D 25 Hydroxy (Vit-D Deficiency, Fractures)     Status: None   Collection Time: 09/05/20 10:32 AM  Result Value Ref Range   Vit D,  25-Hydroxy 39.8 30.0 - 100.0 ng/mL    Comment: Vitamin D deficiency has been defined by the [place] and an Endocrine Society practice guideline as a level of serum 25-OH vitamin D less than 20 ng/mL (1,2). The Endocrine Society went on to further define vitamin D insufficiency as a level between 21 and 29 ng/mL (2). 1. IOM (Institute of Medicine). 2010. Dietary reference    intakes for calcium and D. [place]: The    [employer]. 2. Holick MF, Binkley [place], Bischoff-Ferrari HA, et al.    Evaluation, treatment, and prevention of vitamin D  deficiency: an Endocrine Society clinical practice    guideline. JCEM. 2011 Jul; 96(7):1911-30.   Ferritin     Status: None   Collection Time: 09/05/20 10:32 AM  Result Value Ref Range   Ferritin 76 15.0 - 150.0 ng/mL  Interpretation:     Status: None   Collection Time: 09/05/20 10:32 AM  Result Value Ref Range   HCV Interp 1: Comment     Comment: Negative Not infected with HCV, unless recent infection is suspected or other evidence exists to indicate HCV infection.    Assessment/Plan:  1. Encounter for general adult medical examination with abnormal findings Annual health maintenance exam today. Order slip given to have fasting labs checked.   2. Chest pain, unspecified type - EKG 12-Lead showing non specific t wave abnormality, consistent with pulmonary disease. Will get echocardiogram for further evaluation.  - ECHOCARDIOGRAM COMPLETE; Future  3. Obstructive chronic bronchitis without exacerbation ([place]) Continue inhalers and respiratory medications as prescribed. Echocardiogram ordered for further evaluation. Continue regular visits with Dr. Devona Konig for management of COPD.  - ECHOCARDIOGRAM COMPLETE; Future  4. Suprapubic abdominal pain Unclear etiology. Will get pelvic ultrasound for further evaluation.  - US PELVIC COMPLETE WITH TRANSVAGINAL; Future  5. Encounter for screening mammogram for  malignant neoplasm of breast - MM DIGITAL SCREENING BILATERAL; Future  6. Screening for osteoporosis Bone density test ordered today.   7. Needs flu shot Flu vaccine administered in the office today.  - Flu Vaccine MDCK QUAD PF  8. Need for vaccination against Streptococcus pneumoniae using pneumococcal conjugate vaccine 13 Prescription for pneumonvax sent to her pharmacy for administration.  - pneumococcal 23 valent vaccine (PNEUMOVAX 23) 25 MCG/0.5ML injection; Inject 0.8m IM once  Dispense: 0.5 mL; Refill: 0  9. Dysuria - UA/M w/rflx Culture, Routine   General Counseling: DDebaraverbalizes understanding of the findings of todays visit and agrees with plan of treatment. I have discussed any further diagnostic evaluation that may be needed or ordered today. We also reviewed her medications today. she has been encouraged to call the office with any questions or concerns that should arise related to todays visit.    Counseling:  This patient was seen by HLeretha PolFNP Collaboration with Dr FLavera Guiseas a part of collaborative care agreement  Orders Placed This Encounter  Procedures  . Microscopic Examination  . MM DIGITAL SCREENING BILATERAL  . [place] COMPLETE WITH TRANSVAGINAL  . Flu Vaccine MDCK QUAD PF  . UA/M w/rflx Culture, Routine  . EKG 12-Lead  . ECHOCARDIOGRAM COMPLETE    Meds ordered this encounter  Medications  . pneumococcal 23 valent vaccine (PNEUMOVAX 23) 25 MCG/0.5ML injection    Sig: Inject 0.550mIM once    Dispense:  0.5 mL    Refill:  0    Order Specific Question:   Supervising Provider    Answer:   KHLavera Guise1[[id number]]  Total time spent: [id number]  Time spent includes review of chart, medications, test results, and follow up plan with the patient.     FoLavera GuiseMD  Internal Medicine

## 2020-08-30 LAB — UA/M W/RFLX CULTURE, ROUTINE
Bilirubin, UA: NEGATIVE
Glucose, UA: NEGATIVE
Ketones, UA: NEGATIVE
Leukocytes,UA: NEGATIVE
Nitrite, UA: NEGATIVE
Protein,UA: NEGATIVE
RBC, UA: NEGATIVE
Specific Gravity, UA: 1.007 (ref 1.005–1.030)
Urobilinogen, Ur: 0.2 mg/dL (ref 0.2–1.0)
pH, UA: 6.5 (ref 5.0–7.5)

## 2020-08-30 LAB — MICROSCOPIC EXAMINATION
Bacteria, UA: NONE SEEN
Casts: NONE SEEN /lpf
Epithelial Cells (non renal): NONE SEEN /hpf (ref 0–10)
RBC, Urine: NONE SEEN /hpf (ref 0–2)
WBC, UA: NONE SEEN /hpf (ref 0–5)

## 2020-09-05 ENCOUNTER — Other Ambulatory Visit: Payer: Self-pay | Admitting: Nurse Practitioner

## 2020-09-05 ENCOUNTER — Ambulatory Visit: Admission: RE | Admit: 2020-09-05 | Payer: Medicare Other | Source: Ambulatory Visit

## 2020-09-05 DIAGNOSIS — D509 Iron deficiency anemia, unspecified: Secondary | ICD-10-CM | POA: Diagnosis not present

## 2020-09-05 DIAGNOSIS — R5383 Other fatigue: Secondary | ICD-10-CM | POA: Diagnosis not present

## 2020-09-05 DIAGNOSIS — Z118 Encounter for screening for other infectious and parasitic diseases: Secondary | ICD-10-CM | POA: Diagnosis not present

## 2020-09-05 DIAGNOSIS — I10 Essential (primary) hypertension: Secondary | ICD-10-CM | POA: Diagnosis not present

## 2020-09-05 DIAGNOSIS — D520 Dietary folate deficiency anemia: Secondary | ICD-10-CM | POA: Diagnosis not present

## 2020-09-05 DIAGNOSIS — E559 Vitamin D deficiency, unspecified: Secondary | ICD-10-CM | POA: Diagnosis not present

## 2020-09-05 DIAGNOSIS — D510 Vitamin B12 deficiency anemia due to intrinsic factor deficiency: Secondary | ICD-10-CM | POA: Diagnosis not present

## 2020-09-05 DIAGNOSIS — Z0001 Encounter for general adult medical examination with abnormal findings: Secondary | ICD-10-CM | POA: Diagnosis not present

## 2020-09-06 ENCOUNTER — Other Ambulatory Visit: Payer: Medicare Other

## 2020-09-06 DIAGNOSIS — J449 Chronic obstructive pulmonary disease, unspecified: Secondary | ICD-10-CM | POA: Diagnosis not present

## 2020-09-06 DIAGNOSIS — E7849 Other hyperlipidemia: Secondary | ICD-10-CM | POA: Diagnosis not present

## 2020-09-06 DIAGNOSIS — R079 Chest pain, unspecified: Secondary | ICD-10-CM | POA: Diagnosis not present

## 2020-09-06 DIAGNOSIS — F411 Generalized anxiety disorder: Secondary | ICD-10-CM | POA: Diagnosis not present

## 2020-09-06 LAB — COMPREHENSIVE METABOLIC PANEL
ALT: 20 IU/L (ref 0–32)
AST: 20 IU/L (ref 0–40)
Albumin/Globulin Ratio: 2.2 (ref 1.2–2.2)
Albumin: 4.3 g/dL (ref 3.8–4.8)
Alkaline Phosphatase: 102 IU/L (ref 44–121)
BUN/Creatinine Ratio: 19 (ref 12–28)
BUN: 12 mg/dL (ref 8–27)
Bilirubin Total: 0.3 mg/dL (ref 0.0–1.2)
CO2: 30 mmol/L — ABNORMAL HIGH (ref 20–29)
Calcium: 9.3 mg/dL (ref 8.7–10.3)
Chloride: 100 mmol/L (ref 96–106)
Creatinine, Ser: 0.64 mg/dL (ref 0.57–1.00)
GFR calc Af Amer: 108 mL/min/{1.73_m2} (ref >59)
GFR calc non Af Amer: 93 mL/min/{1.73_m2} (ref >59)
Globulin, Total: 2 g/dL (ref 1.5–4.5)
Glucose: 100 mg/dL — ABNORMAL HIGH (ref 65–99)
Potassium: 4.4 mmol/L (ref 3.5–5.2)
Sodium: 141 mmol/L (ref 134–144)
Total Protein: 6.3 g/dL (ref 6.0–8.5)

## 2020-09-06 LAB — CBC
Hematocrit: 44.5 % (ref 34.0–46.6)
Hemoglobin: 14.6 g/dL (ref 11.1–15.9)
MCH: 27.7 pg (ref 26.6–33.0)
MCHC: 32.8 g/dL (ref 31.5–35.7)
MCV: 84 fL (ref 79–97)
Platelets: 283 10*3/uL (ref 150–450)
RBC: 5.27 x10E6/uL (ref 3.77–5.28)
RDW: 13.6 % (ref 11.7–15.4)
WBC: 6.9 10*3/uL (ref 3.4–10.8)

## 2020-09-06 LAB — FERRITIN: Ferritin: 76 ng/mL (ref 15–150)

## 2020-09-06 LAB — LIPID PANEL WITH LDL/HDL RATIO
Cholesterol, Total: 177 mg/dL (ref 100–199)
HDL: 61 mg/dL (ref >39)
LDL Chol Calc (NIH): 102 mg/dL — ABNORMAL HIGH (ref 0–99)
LDL/HDL Ratio: 1.7 ratio (ref 0.0–3.2)
Triglycerides: 77 mg/dL (ref 0–149)
VLDL Cholesterol Cal: 14 mg/dL (ref 5–40)

## 2020-09-06 LAB — T4, FREE: Free T4: 1.25 ng/dL (ref 0.82–1.77)

## 2020-09-06 LAB — IRON AND TIBC
Iron Saturation: 16 % (ref 15–55)
Iron: 50 ug/dL (ref 27–139)
Total Iron Binding Capacity: 312 ug/dL (ref 250–450)
UIBC: 262 ug/dL (ref 118–369)

## 2020-09-06 LAB — B12 AND FOLATE PANEL
Folate: 18.2 ng/mL (ref >3.0)
Vitamin B-12: 697 pg/mL (ref 232–1245)

## 2020-09-06 LAB — HCV INTERPRETATION

## 2020-09-06 LAB — TSH: TSH: 0.911 u[IU]/mL (ref 0.450–4.500)

## 2020-09-06 LAB — VITAMIN D 25 HYDROXY (VIT D DEFICIENCY, FRACTURES): Vit D, 25-Hydroxy: 39.8 ng/mL (ref 30.0–100.0)

## 2020-09-06 LAB — HCV AB W REFLEX TO QUANT PCR: HCV Ab: <0.1 s/co ratio (ref 0.0–0.9)

## 2020-09-15 ENCOUNTER — Ambulatory Visit: Payer: Medicare Other

## 2020-09-15 ENCOUNTER — Other Ambulatory Visit: Payer: Self-pay

## 2020-09-15 DIAGNOSIS — J449 Chronic obstructive pulmonary disease, unspecified: Secondary | ICD-10-CM

## 2020-09-15 DIAGNOSIS — R079 Chest pain, unspecified: Secondary | ICD-10-CM

## 2020-09-16 ENCOUNTER — Other Ambulatory Visit: Payer: Self-pay | Admitting: Adult Health

## 2020-09-17 DIAGNOSIS — Z0001 Encounter for general adult medical examination with abnormal findings: Secondary | ICD-10-CM | POA: Insufficient documentation

## 2020-09-17 DIAGNOSIS — R102 Pelvic and perineal pain: Secondary | ICD-10-CM | POA: Insufficient documentation

## 2020-09-17 DIAGNOSIS — Z1231 Encounter for screening mammogram for malignant neoplasm of breast: Secondary | ICD-10-CM | POA: Insufficient documentation

## 2020-09-17 NOTE — Progress Notes (Signed)
Labs reviewed. Discuss at next visit.

## 2020-09-18 NOTE — Progress Notes (Signed)
Normal LVEF. Borderline diastolic dysfunction. Normal valves. Discuss at next visit.

## 2020-09-19 ENCOUNTER — Ambulatory Visit: Payer: Medicare Other

## 2020-09-20 ENCOUNTER — Telehealth: Payer: Self-pay

## 2020-09-20 DIAGNOSIS — J449 Chronic obstructive pulmonary disease, unspecified: Secondary | ICD-10-CM

## 2020-09-20 MED ORDER — DALIRESP 250 MCG PO TABS: 1.0000 | ORAL_TABLET | Freq: Every day | ORAL | 4 refills | Status: DC

## 2020-09-20 NOTE — Telephone Encounter (Signed)
Prior auth approved for Daliresp 250 mg on 09/20/2020 thru 11/24/2021

## 2020-10-02 ENCOUNTER — Other Ambulatory Visit: Payer: Self-pay

## 2020-10-02 ENCOUNTER — Ambulatory Visit (INDEPENDENT_AMBULATORY_CARE_PROVIDER_SITE_OTHER): Payer: Medicare Other | Admitting: Nurse Practitioner

## 2020-10-02 ENCOUNTER — Encounter: Payer: Self-pay | Admitting: Nurse Practitioner

## 2020-10-02 VITALS — BP 103/63 | HR 93 | Temp 97.5°F | Resp 16 | Ht 64.0 in | Wt 129.2 lb

## 2020-10-02 DIAGNOSIS — E7849 Other hyperlipidemia: Secondary | ICD-10-CM | POA: Diagnosis not present

## 2020-10-02 DIAGNOSIS — F411 Generalized anxiety disorder: Secondary | ICD-10-CM | POA: Diagnosis not present

## 2020-10-02 DIAGNOSIS — J449 Chronic obstructive pulmonary disease, unspecified: Secondary | ICD-10-CM

## 2020-10-02 DIAGNOSIS — R079 Chest pain, unspecified: Secondary | ICD-10-CM

## 2020-10-02 MED ORDER — ESCITALOPRAM OXALATE 20 MG PO TABS: 20.0000 mg | ORAL_TABLET | Freq: Every day | ORAL | 2 refills | Status: DC

## 2020-10-02 NOTE — Progress Notes (Signed)
[facility], [place] [address]  Internal MEDICINE  Office Visit Note  Patient Name: Rachael Blankenship  [id number]  [id number]  Date of Service: 10/22/2020  Chief Complaint  Patient presents with  . Follow-up  . Gastroesophageal Reflux  . Hyperlipidemia  . Quality Metric Gaps    [place]  . controlled substance form    reviewed with PT    The patient is here for follow up visit. She had routine, fasting labs done. Her LDL was 102 with remainder of lipid panel normal. Her LDL/HDL ration was 1.2. other labs were normal. She had echocardiogram since her most recent visit. She has normal LVEF and borderline diastolic dysfunction. She has normal valves. She still has to have mammogram and bone density done. She states that she has increased depression and anxiety. She is tearful in the office today. States that she has a great deal of increased family stress and is extremely overwhelming. She is fatigued and feels like the increased fatigue may be due to increase in her stress.       Current Medication: Outpatient Encounter Medications as of 10/02/2020  Medication Sig Note  . alendronate (FOSAMAX) 70 MG tablet Take 70 mg by mouth once a week. Take with a full glass of water on an empty stomach.   . ALPRAZolam (XANAX) 0.25 MG tablet Take 1 tablet (0.25 mg total) by mouth 3 (three) times daily as needed for anxiety.   Marland Kitchen ascorbic acid (VITAMIN C) 500 MG tablet Take 500 mg by mouth daily.   Marland Kitchen aspirin 81 MG tablet Take 81 mg by mouth at bedtime.    . Cholecalciferol (VITAMIN D-1000 MAX ST) 1000 units tablet Take 1,000 Units by mouth daily.    . Coenzyme Q10 (CO Q-10) 100 MG CAPS Take 200 mg by mouth daily.    . COMBIVENT RESPIMAT 20-100 MCG/ACT AERS respimat INHALE 1 PUFF INTO THE LUNGS EVERY 6 HOURS AS NEEDED FOR WHEEZING OR SHORTNESS OF BREATH   . DULERA 200-5 MCG/ACT AERO INHALE 1 PUFF BY MOUTH TWICE DAILY   . furosemide (LASIX) 40 MG tablet Take 1 tablet  (40 mg total) by mouth daily.   Marland Kitchen ibuprofen (ADVIL,MOTRIN) 800 MG tablet Take 800 mg by mouth every 8 (eight) hours as needed for mild pain.    Marland Kitchen ipratropium-albuterol (DUONEB) 0.5-2.5 (3) MG/3ML SOLN Use 1 vial via nebulizer every 4 t0 6 hrs   . Magnesium 400 MG TABS Take 400 mg by mouth daily.    . mometasone (NASONEX) 50 MCG/ACT nasal spray Place 2 sprays into the nose daily.   . montelukast (SINGULAIR) 10 MG tablet TAKE 1 TABLET BY MOUTH EVERY DAY FOR RHINITIS OR ALLERGIES   . omeprazole (PRILOSEC) 40 MG capsule Take 1 capsule (40 mg total) by mouth 2 (two) times daily.   . OXYGEN Inhale into the lungs. Oxygen at night, 2 liters   . pneumococcal 23 valent vaccine (PNEUMOVAX 23) 25 MCG/0.5ML injection Inject 0.92ml IM once   . Potassium Chloride CR (MICRO-K) 8 MEQ CPCR capsule CR TAKE 1 CAPSULE BY MOUTH DAILY. MAY TAKE EXTRA 2-3 TIMES A WEEK   . Roflumilast (DALIRESP) 250 MCG TABS Take 1 tablet by mouth daily.   . simvastatin (ZOCOR) 20 MG tablet TAKE 1 TABLET(20 MG) BY MOUTH EVERY EVENING FOR CHOLESTEROL   . SPIRIVA HANDIHALER 18 MCG inhalation capsule INHALE CONTENTS OF 1 CAPSULE ONCE DAILY USING HANDIHALER   . sucralfate (CARAFATE) 1 g tablet TAKE 1  TABLET BY MOUTH FOUR TIMES DAILY(WITH MEALS AND AT BEDTIME)   . vitamin B-12 (CYANOCOBALAMIN) 1000 MCG tablet Take 1,000 mcg by mouth daily.   . [DISCONTINUED] citalopram (CELEXA) 40 MG tablet Take 0.5 tablets (20 mg total) by mouth daily. 10/02/2020: change to lexapro 20mg  daily  . escitalopram (LEXAPRO) 20 MG tablet Take 1 tablet (20 mg total) by mouth daily.    No facility-administered encounter medications on file as of 10/02/2020.    Surgical History: Past Surgical History:  Procedure Laterality Date  . ESOPHAGOGASTRODUODENOSCOPY N/A 07/29/2018   Procedure: ESOPHAGOGASTRODUODENOSCOPY (EGD);  Surgeon: Toledo, Benay Pike, MD;  Location: [facility] ENDOSCOPY;  Service: Gastroenterology;  Laterality: N/A;  . KIDNEY STONE SURGERY    . TONSILLECTOMY       Medical History: Past Medical History:  Diagnosis Date  . COPD (chronic obstructive pulmonary disease) ([place])   . Endometriosis   . GERD (gastroesophageal reflux disease)   . Hyperlipidemia   . Kidney stones     Family History: Family History  Problem Relation Age of Onset  . Hypertension Mother   . Hyperlipidemia Mother   . GER disease Mother   . Heart attack Father        23 yrs ago  . Hypertension Father   . Parkinson's disease Father   . GER disease Father     Social History   Socioeconomic History  . Marital status: Married    Spouse name: Not on file  . Number of children: Not on file  . Years of education: Not on file  . Highest education level: Not on file  Occupational History  . Not on file  Tobacco Use  . Smoking status: Current Every Day Smoker    Packs/day: 1.00    Types: Cigarettes    Start date: 11/09/2017  . Smokeless tobacco: Never Used  . Tobacco comment: 1 pack a day   Vaping Use  . Vaping Use: Former  Substance and Sexual Activity  . Alcohol use: Yes    Comment: ocassionally   . Drug use: No  . Sexual activity: Not on file  Other Topics Concern  . Not on file  Social History Narrative  . Not on file   Social Determinants of Health   Financial Resource Strain:   . Difficulty of Paying Living Expenses: Not on file  Food Insecurity:   . Worried About Charity fundraiser in the Last Year: Not on file  . Ran Out of Food in the Last Year: Not on file  Transportation Needs:   . Lack of Transportation (Medical): Not on file  . Lack of Transportation (Non-Medical): Not on file  Physical Activity:   . Days of Exercise per Week: Not on file  . Minutes of Exercise per Session: Not on file  Stress:   . Feeling of Stress : Not on file  Social Connections:   . Frequency of Communication with Friends and Family: Not on file  . Frequency of Social Gatherings with Friends and Family: Not on file  . Attends Religious Services: Not on file   . Active Member of Clubs or Organizations: Not on file  . Attends Archivist Meetings: Not on file  . Marital Status: Not on file  Intimate Partner Violence:   . Fear of Current or Ex-Partner: Not on file  . Emotionally Abused: Not on file  . Physically Abused: Not on file  . Sexually Abused: Not on file      Review of  Systems  Constitutional: Positive for fatigue. Negative for activity change, chills and unexpected weight change.  HENT: Negative for congestion, postnasal drip, rhinorrhea, sneezing and sore throat.   Respiratory: Positive for shortness of breath and wheezing. Negative for cough.        Intermittent. Generally well managed.   Cardiovascular: Positive for chest pain. Negative for palpitations and leg swelling.  Gastrointestinal: Negative for abdominal pain, constipation, diarrhea, nausea and vomiting.       Patient no longer having pelvic discomfort.   Endocrine: Negative for cold intolerance, heat intolerance, polydipsia and polyuria.  Genitourinary: Negative for dysuria, frequency, pelvic pain, vaginal bleeding and vaginal discharge.  Musculoskeletal: Negative for arthralgias, back pain, joint swelling and neck pain.  Skin: Negative for rash.  Allergic/Immunologic: Positive for environmental allergies.  Neurological: Negative for dizziness, tremors, numbness and headaches.  Hematological: Negative for adenopathy. Does not bruise/bleed easily.  Psychiatric/Behavioral: Positive for dysphoric mood. Negative for behavioral problems (Depression), sleep disturbance and suicidal ideas. The patient is nervous/anxious.        Patient has increased depression and anxiety. She has increased family stress causing her to feel overwhelmed.     Today's Vitals   10/02/20 1423  BP: 103/63  Pulse: 93  Resp: 16  Temp: (!) 97.5 F (36.4 C)  SpO2: 95%  Weight: 129 lb 3.2 oz (58.6 kg)  Height: 5\' 4"  (1.626 m)   Body mass index is 22.18 kg/m.  Physical Exam Vitals  and nursing note reviewed.  Constitutional:      General: She is not in acute distress.    Appearance: Normal appearance. She is well-developed. She is not diaphoretic.  HENT:     Head: Normocephalic and atraumatic.     Nose: Nose normal.     Mouth/Throat:     Pharynx: No oropharyngeal exudate.  Eyes:     Pupils: Pupils are equal, round, and reactive to light.  Neck:     Thyroid: No thyromegaly.     Vascular: No carotid bruit or JVD.     Trachea: No tracheal deviation.  Cardiovascular:     Rate and Rhythm: Normal rate and regular rhythm.     Heart sounds: Normal heart sounds. No murmur heard.  No friction rub. No gallop.   Pulmonary:     Effort: Pulmonary effort is normal. No respiratory distress.     Breath sounds: Normal breath sounds. No wheezing or rales.     Comments: Breath sounds clear but diminished throughout lung fields.  Chest:     Chest wall: No tenderness.  Abdominal:     General: Bowel sounds are normal.     Palpations: Abdomen is soft.     Tenderness: There is no abdominal tenderness.  Musculoskeletal:        General: Normal range of motion.     Cervical back: Normal range of motion and neck supple.  Lymphadenopathy:     Cervical: No cervical adenopathy.  Skin:    General: Skin is warm and dry.     Capillary Refill: Capillary refill takes less than 2 seconds.  Neurological:     General: No focal deficit present.     Mental Status: She is alert and oriented to person, place, and time.     Cranial Nerves: No cranial nerve deficit.  Psychiatric:        Attention and Perception: Attention and perception normal.        Mood and Affect: Mood is anxious and depressed. Affect is tearful.  Speech: Speech normal.        Behavior: Behavior normal. Behavior is cooperative.        Thought Content: Thought content normal.        Cognition and Memory: Cognition and memory normal.        Judgment: Judgment normal.    Assessment/Plan: 1. Chest pain, unspecified  type Improved. Reviewed results of echocardiogram with the patient. She has normal LVEF, borderline diastolic dysfunction, and normal valves. Will continue to monitor.   2. Obstructive chronic bronchitis without exacerbation (HCC) Stable. Continue inhalers and respiratory medications as prescribed   3. Generalized anxiety disorder Change treatment from citalopram to escitalopram 20mg  daily. Written instructions provided for her to wean off citalopram while starting lexapro. She voiced understanding and agreement with instructions.  - escitalopram (LEXAPRO) 20 MG tablet; Take 1 tablet (20 mg total) by mouth daily.  Dispense: 30 tablet; Refill: 2  4. Other hyperlipidemia Reviewed fasting lipid panel which showed ldl 102 with remainder of lipid panel normal. Continue simvastatin as prescribed.   General Counseling: Rachael Blankenship understanding of the findings of todays visit and agrees with plan of treatment. I have discussed any further diagnostic evaluation that may be needed or ordered today. We also reviewed her medications today. she has been encouraged to call the office with any questions or concerns that should arise related to todays visit.   This patient was seen by [place] with Dr Lavera Guise as a part of collaborative care agreement  Meds ordered this encounter  Medications  . escitalopram (LEXAPRO) 20 MG tablet    Sig: Take 1 tablet (20 mg total) by mouth daily.    Dispense:  30 tablet    Refill:  2    Patient given written instructions to wean from citalopram to escitalopram .    Order Specific Question:   Supervising Provider    Answer:   Lavera Guise [[id number]]    Total time spent: 30 Minutes   Time spent includes review of chart, medications, test results, and follow up plan with the patient.      Dr Lavera Guise Internal medicine

## 2020-10-03 ENCOUNTER — Telehealth: Payer: Self-pay

## 2020-10-03 ENCOUNTER — Other Ambulatory Visit: Payer: Self-pay | Admitting: Nurse Practitioner

## 2020-10-03 DIAGNOSIS — J0111 Acute recurrent frontal sinusitis: Secondary | ICD-10-CM

## 2020-10-03 MED ORDER — AMOXICILLIN-POT CLAVULANATE 875-125 MG PO TABS: 1.0000 | ORAL_TABLET | Freq: Two times a day (BID) | ORAL | 0 refills | Status: DC

## 2020-10-03 NOTE — Telephone Encounter (Signed)
Pt advised that we send antibiotic and go for covid  test and also if symptoms get worse go to ED

## 2020-10-03 NOTE — Telephone Encounter (Signed)
I sent prescription for augmentin 875mg  twice daily for 10 days to her pharmacy. I also recommend she take OTC zinc, vitamin d, and vitamin d. She should rest and increase fluids and please be tested for COVID 19 and advise [place] of results when available.

## 2020-10-05 ENCOUNTER — Telehealth: Payer: Self-pay

## 2020-10-05 ENCOUNTER — Other Ambulatory Visit: Payer: Self-pay | Admitting: Nurse Practitioner

## 2020-10-05 DIAGNOSIS — J069 Acute upper respiratory infection, unspecified: Secondary | ICD-10-CM

## 2020-10-05 DIAGNOSIS — R059 Cough, unspecified: Secondary | ICD-10-CM

## 2020-10-05 MED ORDER — AZITHROMYCIN 250 MG PO TABS: ORAL_TABLET | ORAL | 0 refills | Status: DC

## 2020-10-05 MED ORDER — METHYLPREDNISOLONE 4 MG PO TBPK: ORAL_TABLET | ORAL | 0 refills | Status: DC

## 2020-10-05 NOTE — Telephone Encounter (Signed)
The patient is definitely positive for COVID. She needs to follow CDC quarantine guidelines. I have sent in prescription for 10 days of zithromax and added a medrol dose pack. OTC she should take vitamin c, vitamin d, and zinc. She needs to take medications for symptom relief as needed and as indicated. She should rest and drink plenty of fluids. Thanks.

## 2020-10-05 NOTE — Telephone Encounter (Signed)
Spoke to pt and advised that zpak sent to pharmacy and also told pt to take OTC vitamins and other meds to assist with symptoms, for pt to rest and drink plenty of fluids

## 2020-10-12 ENCOUNTER — Other Ambulatory Visit: Payer: Self-pay | Admitting: Nurse Practitioner

## 2020-10-12 ENCOUNTER — Telehealth: Payer: Self-pay

## 2020-10-12 DIAGNOSIS — R059 Cough, unspecified: Secondary | ICD-10-CM

## 2020-10-12 DIAGNOSIS — J069 Acute upper respiratory infection, unspecified: Secondary | ICD-10-CM

## 2020-10-12 MED ORDER — LEVOFLOXACIN 500 MG PO TABS: 500.0000 mg | ORAL_TABLET | Freq: Every day | ORAL | 0 refills | Status: DC

## 2020-10-12 NOTE — Telephone Encounter (Signed)
I have sent in round levofloxacin 500mg  daily for next 10 days. Also ordered chest x-ray. She should be taking OTC zinc, vitamin d, and vitamin d eeryday. Thanks.

## 2020-10-13 ENCOUNTER — Other Ambulatory Visit: Payer: Self-pay

## 2020-10-13 DIAGNOSIS — R102 Pelvic and perineal pain: Secondary | ICD-10-CM

## 2020-10-13 DIAGNOSIS — R059 Cough, unspecified: Secondary | ICD-10-CM

## 2020-10-13 DIAGNOSIS — J069 Acute upper respiratory infection, unspecified: Secondary | ICD-10-CM

## 2020-11-01 ENCOUNTER — Ambulatory Visit: Payer: Medicare Other | Admitting: Hospice and Palliative Medicine

## 2020-11-02 ENCOUNTER — Ambulatory Visit: Payer: Medicare Other | Admitting: Nurse Practitioner

## 2020-11-06 ENCOUNTER — Telehealth: Payer: Self-pay

## 2020-11-06 NOTE — Telephone Encounter (Signed)
Called patient to rs missed ov on 11-01-20 and 11/15/2020. Patient had a death in the family both appointments have been rescheduled. clt

## 2020-11-07 ENCOUNTER — Other Ambulatory Visit: Payer: Self-pay

## 2020-11-07 ENCOUNTER — Ambulatory Visit (INDEPENDENT_AMBULATORY_CARE_PROVIDER_SITE_OTHER): Payer: Medicare Other | Admitting: Nurse Practitioner

## 2020-11-07 ENCOUNTER — Encounter: Payer: Self-pay | Admitting: Nurse Practitioner

## 2020-11-07 VITALS — BP 120/64 | HR 81 | Temp 97.4°F | Resp 16 | Ht 64.0 in | Wt 129.4 lb

## 2020-11-07 DIAGNOSIS — Z8616 Personal history of COVID-19: Secondary | ICD-10-CM | POA: Diagnosis not present

## 2020-11-07 DIAGNOSIS — F411 Generalized anxiety disorder: Secondary | ICD-10-CM | POA: Diagnosis not present

## 2020-11-07 DIAGNOSIS — J449 Chronic obstructive pulmonary disease, unspecified: Secondary | ICD-10-CM

## 2020-11-07 MED ORDER — ESCITALOPRAM OXALATE 20 MG PO TABS: 20.0000 mg | ORAL_TABLET | Freq: Every day | ORAL | 1 refills | Status: DC

## 2020-11-07 NOTE — Progress Notes (Signed)
[facility] [place], [place] [address]  Internal MEDICINE  Office Visit Note  Patient Name: Rachael Blankenship  [id number]  [id number]  Date of Service: 12/11/2020  Chief Complaint  Patient presents with  . Follow-up    Discuss meds  . Gastroesophageal Reflux  . Hyperlipidemia    Patient here for routine follow up.  -changed from citalopram to escitalopram at her last visit. Had been having increased family stress and overwhelm. Feels like this is helping. She did lose her father on 10/28/2020. Dealing with this stress ok.  -has had COVID 19 since her most recent visit. Did round of levofloxacin and medrol dose pack. Feeling better.  -was started on daliresp at last visit with pulmonology and feels like this is making a big improvement with her breathing overall.       Current Medication: Outpatient Encounter Medications as of 11/07/2020  Medication Sig  . alendronate (FOSAMAX) 70 MG tablet Take 70 mg by mouth once a week. Take with a full glass of water on an empty stomach.  Marland Kitchen ascorbic acid (VITAMIN C) 500 MG tablet Take 500 mg by mouth daily.  Marland Kitchen aspirin 81 MG tablet Take 81 mg by mouth at bedtime.   . Cholecalciferol 25 MCG (1000 UT) tablet Take 1,000 Units by mouth daily.   . Coenzyme Q10 (CO Q-10) 100 MG CAPS Take 200 mg by mouth daily.   . COMBIVENT RESPIMAT 20-100 MCG/ACT AERS respimat INHALE 1 PUFF INTO THE LUNGS EVERY 6 HOURS AS NEEDED FOR WHEEZING OR SHORTNESS OF BREATH  . furosemide (LASIX) 40 MG tablet Take 1 tablet (40 mg total) by mouth daily.  Marland Kitchen ibuprofen (ADVIL,MOTRIN) 800 MG tablet Take 800 mg by mouth every 8 (eight) hours as needed for mild pain.   Marland Kitchen ipratropium-albuterol (DUONEB) 0.5-2.5 (3) MG/3ML SOLN Use 1 vial via nebulizer every 4 t0 6 hrs  . Magnesium 400 MG TABS Take 400 mg by mouth daily.   . mometasone (NASONEX) 50 MCG/ACT nasal spray Place 2 sprays into the nose daily.  Marland Kitchen omeprazole (PRILOSEC) 40 MG capsule Take 1 capsule (40  mg total) by mouth 2 (two) times daily.  . OXYGEN Inhale into the lungs. Oxygen at night, 2 liters  . pneumococcal 23 valent vaccine (PNEUMOVAX 23) 25 MCG/0.5ML injection Inject 0.45ml IM once  . Potassium Chloride CR (MICRO-K) 8 MEQ CPCR capsule CR TAKE 1 CAPSULE BY MOUTH DAILY. MAY TAKE EXTRA 2-3 TIMES A WEEK  . SPIRIVA HANDIHALER 18 MCG inhalation capsule INHALE CONTENTS OF 1 CAPSULE ONCE DAILY USING HANDIHALER  . sucralfate (CARAFATE) 1 g tablet TAKE 1 TABLET BY MOUTH FOUR TIMES DAILY(WITH MEALS AND AT BEDTIME)  . vitamin B-12 (CYANOCOBALAMIN) 1000 MCG tablet Take 1,000 mcg by mouth daily.  . [DISCONTINUED] ALPRAZolam (XANAX) 0.25 MG tablet Take 1 tablet (0.25 mg total) by mouth 3 (three) times daily as needed for anxiety.  . [DISCONTINUED] DULERA 200-5 MCG/ACT AERO INHALE 1 PUFF BY MOUTH TWICE DAILY  . [DISCONTINUED] escitalopram (LEXAPRO) 20 MG tablet Take 1 tablet (20 mg total) by mouth daily.  . [DISCONTINUED] levofloxacin (LEVAQUIN) 500 MG tablet Take 1 tablet (500 mg total) by mouth daily. For 10 days  . [DISCONTINUED] methylPREDNISolone (MEDROL) 4 MG TBPK tablet Take by mouth as directed for 6 days  . [DISCONTINUED] montelukast (SINGULAIR) 10 MG tablet TAKE 1 TABLET BY MOUTH EVERY DAY FOR RHINITIS OR ALLERGIES  . [DISCONTINUED] Roflumilast (DALIRESP) 250 MCG TABS Take 1 tablet by mouth daily.  . [  DISCONTINUED] simvastatin (ZOCOR) 20 MG tablet TAKE 1 TABLET(20 MG) BY MOUTH EVERY EVENING FOR CHOLESTEROL  . escitalopram (LEXAPRO) 20 MG tablet Take 1 tablet (20 mg total) by mouth daily.   No facility-administered encounter medications on file as of 11/07/2020.    Surgical History: Past Surgical History:  Procedure Laterality Date  . ESOPHAGOGASTRODUODENOSCOPY N/A 07/29/2018   Procedure: ESOPHAGOGASTRODUODENOSCOPY (EGD);  Surgeon: Toledo, Benay Pike, MD;  Location: [facility] ENDOSCOPY;  Service: Gastroenterology;  Laterality: N/A;  . KIDNEY STONE SURGERY    . TONSILLECTOMY      Medical  History: Past Medical History:  Diagnosis Date  . COPD (chronic obstructive pulmonary disease) ([place])   . Endometriosis   . GERD (gastroesophageal reflux disease)   . Hyperlipidemia   . Kidney stones     Family History: Family History  Problem Relation Age of Onset  . Hypertension Mother   . Hyperlipidemia Mother   . GER disease Mother   . Heart attack Father        23 yrs ago  . Hypertension Father   . Parkinson's disease Father   . GER disease Father     Social History   Socioeconomic History  . Marital status: Married    Spouse name: Not on file  . Number of children: Not on file  . Years of education: Not on file  . Highest education level: Not on file  Occupational History  . Not on file  Tobacco Use  . Smoking status: Current Every Day Smoker    Packs/day: 1.00    Types: Cigarettes    Start date: 11/09/2017  . Smokeless tobacco: Never Used  . Tobacco comment: 1 pack a day   Vaping Use  . Vaping Use: Former  Substance and Sexual Activity  . Alcohol use: Yes    Comment: ocassionally   . Drug use: No  . Sexual activity: Not on file  Other Topics Concern  . Not on file  Social History Narrative  . Not on file   Social Determinants of Health   Financial Resource Strain: Not on file  Food Insecurity: Not on file  Transportation Needs: Not on file  Physical Activity: Not on file  Stress: Not on file  Social Connections: Not on file  Intimate Partner Violence: Not on file      Review of Systems  Constitutional: Positive for fatigue. Negative for activity change, chills and unexpected weight change.  HENT: Negative for congestion, postnasal drip, rhinorrhea, sneezing and sore throat.   Respiratory: Positive for shortness of breath and wheezing. Negative for cough.        Intermittent. Generally well managed.   Cardiovascular: Negative for chest pain, palpitations and leg swelling.  Gastrointestinal: Negative for abdominal pain, constipation,  diarrhea, nausea and vomiting.  Endocrine: Negative for cold intolerance, heat intolerance, polydipsia and polyuria.  Musculoskeletal: Negative for arthralgias, back pain, joint swelling and neck pain.  Skin: Negative for rash.  Allergic/Immunologic: Positive for environmental allergies.  Neurological: Negative for dizziness, tremors, numbness and headaches.  Hematological: Negative for adenopathy. Does not bruise/bleed easily.  Psychiatric/Behavioral: Positive for dysphoric mood. Negative for behavioral problems (Depression), sleep disturbance and suicidal ideas. The patient is nervous/anxious.        Patient has increased depression and anxiety. She has increased family stress causing her to feel overwhelmed.     Today's Vitals   11/07/20 1536  BP: 120/64  Pulse: 81  Resp: 16  Temp: (!) 97.4 F (36.3 C)  SpO2: 94%  Weight: 129 lb 6.4 oz (58.7 kg)  Height: 5\' 4"  (1.626 m)   Body mass index is 22.21 kg/m.  Physical Exam Vitals and nursing note reviewed.  Constitutional:      General: She is not in acute distress.    Appearance: Normal appearance. She is well-developed. She is not diaphoretic.  HENT:     Head: Normocephalic and atraumatic.     Nose: Nose normal.     Mouth/Throat:     Pharynx: No oropharyngeal exudate.  Eyes:     Pupils: Pupils are equal, round, and reactive to light.  Neck:     Thyroid: No thyromegaly.     Vascular: No carotid bruit or JVD.     Trachea: No tracheal deviation.  Cardiovascular:     Rate and Rhythm: Normal rate and regular rhythm.     Heart sounds: Normal heart sounds. No murmur heard. No friction rub. No gallop.   Pulmonary:     Effort: Pulmonary effort is normal. No respiratory distress.     Breath sounds: Normal breath sounds. No wheezing or rales.     Comments: Breath sounds clear but diminished throughout lung fields.  Chest:     Chest wall: No tenderness.  Abdominal:     General: Bowel sounds are normal.     Palpations: Abdomen  is soft.     Tenderness: There is no abdominal tenderness.  Musculoskeletal:        General: Normal range of motion.     Cervical back: Normal range of motion and neck supple.  Lymphadenopathy:     Cervical: No cervical adenopathy.  Skin:    General: Skin is warm and dry.     Capillary Refill: Capillary refill takes less than 2 seconds.  Neurological:     General: No focal deficit present.     Mental Status: She is alert and oriented to person, place, and time.     Cranial Nerves: No cranial nerve deficit.  Psychiatric:        Attention and Perception: Attention and perception normal.        Mood and Affect: Mood is anxious and depressed. Affect is tearful.        Speech: Speech normal.        Behavior: Behavior normal. Behavior is cooperative.        Thought Content: Thought content normal.        Cognition and Memory: Cognition and memory normal.        Judgment: Judgment normal.     Comments: Improved depression/anxieety since changing cialopram to escitalopram.     Assessment/Plan:  1. Chronic obstructive pulmonary disease, unspecified COPD type ([place]) Currently stable. Continue to use inhalers and respiratory medication as prescribed   2. History of COVID-19 Patient recently diagnosed with COVID 19 and has completed her self-isolation per CDC recommendations. Is feeling better. Will monitor.   3. Generalized anxiety disorder Improved on escitalopram. Continue 20mg  daily. Refills sent to her pharmacy. May continue alprazolam 0.25mg  as needed and as previously prescribed.  - escitalopram (LEXAPRO) 20 MG tablet; Take 1 tablet (20 mg total) by mouth daily.  Dispense: 90 tablet; Refill: 1  General Counseling: Rachael Blankenship verbalizes understanding of the findings of todays visit and agrees with plan of treatment. I have discussed any further diagnostic evaluation that may be needed or ordered today. We also reviewed her medications today. she has been encouraged to call the office with  any questions or concerns that should arise related to  todays visit.   This patient was seen by [place] with Dr Lavera Guise as a part of collaborative care agreement  Meds ordered this encounter  Medications  . escitalopram (LEXAPRO) 20 MG tablet    Sig: Take 1 tablet (20 mg total) by mouth daily.    Dispense:  90 tablet    Refill:  1    Please fill as 90 day prescription with next fill.    Order Specific Question:   Supervising Provider    Answer:   Lavera Guise [[id number]]    Total time spent: 25 Minutes   Time spent includes review of chart, medications, test results, and follow up plan with the patient.      Dr Lavera Guise Internal medicine

## 2020-11-08 ENCOUNTER — Other Ambulatory Visit: Payer: Self-pay | Admitting: Adult Health

## 2020-11-08 DIAGNOSIS — J449 Chronic obstructive pulmonary disease, unspecified: Secondary | ICD-10-CM

## 2020-11-14 DIAGNOSIS — D485 Neoplasm of uncertain behavior of skin: Secondary | ICD-10-CM | POA: Diagnosis not present

## 2020-11-14 DIAGNOSIS — L729 Follicular cyst of the skin and subcutaneous tissue, unspecified: Secondary | ICD-10-CM | POA: Diagnosis not present

## 2020-11-15 ENCOUNTER — Other Ambulatory Visit: Payer: Self-pay

## 2020-11-15 DIAGNOSIS — J449 Chronic obstructive pulmonary disease, unspecified: Secondary | ICD-10-CM

## 2020-11-15 MED ORDER — DULERA 200-5 MCG/ACT IN AERO: 1.0000 | INHALATION_SPRAY | Freq: Two times a day (BID) | RESPIRATORY_TRACT | 3 refills | Status: DC

## 2020-11-29 ENCOUNTER — Other Ambulatory Visit: Payer: Self-pay

## 2020-11-29 DIAGNOSIS — F411 Generalized anxiety disorder: Secondary | ICD-10-CM

## 2020-11-29 MED ORDER — ALPRAZOLAM 0.25 MG PO TABS: 0.2500 mg | ORAL_TABLET | Freq: Three times a day (TID) | ORAL | 2 refills | Status: DC | PRN

## 2020-11-29 NOTE — Telephone Encounter (Signed)
Lmom we send medication to [facility]

## 2020-12-05 ENCOUNTER — Other Ambulatory Visit: Payer: Self-pay

## 2020-12-05 ENCOUNTER — Telehealth: Payer: Self-pay

## 2020-12-05 ENCOUNTER — Ambulatory Visit: Payer: Medicare Other | Admitting: Internal Medicine

## 2020-12-05 ENCOUNTER — Encounter: Payer: Self-pay | Admitting: Internal Medicine

## 2020-12-05 VITALS — BP 109/72 | HR 68 | Temp 97.8°F | Resp 16 | Ht 64.0 in | Wt 127.6 lb

## 2020-12-05 DIAGNOSIS — J449 Chronic obstructive pulmonary disease, unspecified: Secondary | ICD-10-CM

## 2020-12-05 DIAGNOSIS — R0602 Shortness of breath: Secondary | ICD-10-CM

## 2020-12-05 DIAGNOSIS — K219 Gastro-esophageal reflux disease without esophagitis: Secondary | ICD-10-CM

## 2020-12-05 DIAGNOSIS — Z720 Tobacco use: Secondary | ICD-10-CM

## 2020-12-05 MED ORDER — SIMVASTATIN 20 MG PO TABS
ORAL_TABLET | ORAL | 0 refills | Status: DC
Start: 2020-12-05 — End: 2021-03-14

## 2020-12-05 MED ORDER — MONTELUKAST SODIUM 10 MG PO TABS: 10.0000 mg | ORAL_TABLET | Freq: Every day | ORAL | 5 refills | Status: DC

## 2020-12-05 MED ORDER — DALIRESP 250 MCG PO TABS: 1.0000 | ORAL_TABLET | Freq: Every day | ORAL | 4 refills | Status: DC

## 2020-12-05 NOTE — Patient Instructions (Signed)
Managing the Challenge of Quitting Smoking Quitting smoking is a physical and mental challenge. You will face cravings, withdrawal symptoms, and temptation. Before quitting, work with your health care provider to make a plan that can help you manage quitting. Preparation can help you quit and keep you from giving in. How to manage lifestyle changes Managing stress Stress can make you want to smoke, and wanting to smoke may cause stress. It is important to find ways to manage your stress. You might try some of the following:  Practice relaxation techniques. ? Breathe slowly and deeply, in through your nose and out through your mouth. ? Listen to music. ? Soak in a bath or take a shower. ? Imagine a peaceful place or vacation.  Get some support. ? Talk with family or friends about your stress. ? Join a support group. ? Talk with a counselor or therapist.  Get some physical activity. ? Go for a walk, run, or bike ride. ? Play a favorite sport. ? Practice yoga.   Medicines Talk with your health care provider about medicines that might help you deal with cravings and make quitting easier for you. Relationships Social situations can be difficult when you are quitting smoking. To manage this, you can:  Avoid parties and other social situations where people might be smoking.  Avoid alcohol.  Leave right away if you have the urge to smoke.  Explain to your family and friends that you are quitting smoking. Ask for support and let them know you might be a bit grumpy.  Plan activities where smoking is not an option. General instructions Be aware that many people gain weight after they quit smoking. However, not everyone does. To keep from gaining weight, have a plan in place before you quit and stick to the plan after you quit. Your plan should include:  Having healthy snacks. When you have a craving, it may help to: ? Eat popcorn, carrots, celery, or other cut vegetables. ? Chew  sugar-free gum.  Changing how you eat. ? Eat small portion sizes at meals. ? Eat 4-6 small meals throughout the day instead of 1-2 large meals a day. ? Be mindful when you eat. Do not watch television or do other things that might distract you as you eat.  Exercising regularly. ? Make time to exercise each day. If you do not have time for a long workout, do short bouts of exercise for 5-10 minutes several times a day. ? Do some form of strengthening exercise, such as weight lifting. ? Do some exercise that gets your heart beating and causes you to breathe deeply, such as walking fast, running, swimming, or biking. This is very important.  Drinking plenty of water or other low-calorie or no-calorie drinks. Drink 6-8 glasses of water daily.   How to recognize withdrawal symptoms Your body and mind may experience discomfort as you try to get used to not having nicotine in your system. These effects are called withdrawal symptoms. They may include:  Feeling hungrier than normal.  Having trouble concentrating.  Feeling irritable or restless.  Having trouble sleeping.  Feeling depressed.  Craving a cigarette. To manage withdrawal symptoms:  Avoid places, people, and activities that trigger your cravings.  Remember why you want to quit.  Get plenty of sleep.  Avoid coffee and other caffeinated drinks. These may worsen some of your symptoms. These symptoms may surprise you. But be assured that they are normal to have when quitting smoking. How to manage cravings   Come up with a plan for how to deal with your cravings. The plan should include the following:  A definition of the specific situation you want to deal with.  An alternative action you will take.  A clear idea for how this action will help.  The name of someone who might help you with this. Cravings usually last for 5-10 minutes. Consider taking the following actions to help you with your plan to deal with  cravings:  Keep your mouth busy. ? Chew sugar-free gum. ? Suck on hard candies or a straw. ? Brush your teeth.  Keep your hands and body busy. ? Change to a different activity right away. ? Squeeze or play with a ball. ? Do an activity or a hobby, such as making bead jewelry, practicing needlepoint, or working with wood. ? Mix up your normal routine. ? Take a short exercise break. Go for a quick walk or run up and down stairs.  Focus on doing something kind or helpful for someone else.  Call a friend or family member to talk during a craving.  Join a support group.  Contact a quitline. Where to find support To get help or find a support group:  Call the National Cancer Institute's Smoking Quitline: 1-800-QUIT NOW (784-8669)  Visit the website of the Substance Abuse and Mental Health Services Administration: www.samhsa.gov  Text QUIT to SmokefreeTXT: 478848 Where to find more information Visit these websites to find more information on quitting smoking:  National Cancer Institute: www.smokefree.gov  American Lung Association: www.lung.org  American Cancer Society: www.cancer.org  Centers for Disease Control and Prevention: www.cdc.gov  American Heart Association: www.heart.org Contact a health care provider if:  You want to change your plan for quitting.  The medicines you are taking are not helping.  Your eating feels out of control or you cannot sleep. Get help right away if:  You feel depressed or become very anxious. Summary  Quitting smoking is a physical and mental challenge. You will face cravings, withdrawal symptoms, and temptation to smoke again. Preparation can help you as you go through these challenges.  Try different techniques to manage stress, handle social situations, and prevent weight gain.  You can deal with cravings by keeping your mouth busy (such as by chewing gum), keeping your hands and body busy, calling family or friends, or  contacting a quitline for people who want to quit smoking.  You can deal with withdrawal symptoms by avoiding places where people smoke, getting plenty of rest, and avoiding drinks with caffeine. This information is not intended to replace advice given to you by your health care provider. Make sure you discuss any questions you have with your health care provider. Document Revised: 08/31/2019 Document Reviewed: 08/31/2019 Elsevier Patient Education  2021 Elsevier Inc.  

## 2020-12-05 NOTE — Progress Notes (Unsigned)
[facility] [place], [place] [address]  Pulmonary Sleep Medicine   Office Visit Note  Patient Name: Rachael Blankenship DOB: 11-Feb-1956 MRN [id number]  Date of Service: 12/05/2020  Complaints/HPI: COPD Smoker  ROS  General: (-) fever, (-) chills, (-) night sweats, (-) weakness Skin: (-) rashes, (-) itching,. Eyes: (-) visual changes, (-) redness, (-) itching. Nose and Sinuses: (-) nasal stuffiness or itchiness, (-) postnasal drip, (-) nosebleeds, (-) sinus trouble. Mouth and Throat: (-) sore throat, (-) hoarseness. Neck: (-) swollen glands, (-) enlarged thyroid, (-) neck pain. Respiratory: + cough, (-) bloody sputum, + shortness of breath, + wheezing. Cardiovascular: - ankle swelling, (-) chest pain. Lymphatic: (-) lymph node enlargement. Neurologic: (-) numbness, (-) tingling. Psychiatric: (-) anxiety, (-) depression   Current Medication: Outpatient Encounter Medications as of 12/05/2020  Medication Sig  . alendronate (FOSAMAX) 70 MG tablet Take 70 mg by mouth once a week. Take with a full glass of water on an empty stomach.  . ALPRAZolam (XANAX) 0.25 MG tablet Take 1 tablet (0.25 mg total) by mouth 3 (three) times daily as needed for anxiety.  Marland Kitchen ascorbic acid (VITAMIN C) 500 MG tablet Take 500 mg by mouth daily.  Marland Kitchen aspirin 81 MG tablet Take 81 mg by mouth at bedtime.   . Cholecalciferol 25 MCG (1000 UT) tablet Take 1,000 Units by mouth daily.   . Coenzyme Q10 (CO Q-10) 100 MG CAPS Take 200 mg by mouth daily.   . COMBIVENT RESPIMAT 20-100 MCG/ACT AERS respimat INHALE 1 PUFF INTO THE LUNGS EVERY 6 HOURS AS NEEDED FOR WHEEZING OR SHORTNESS OF BREATH  . escitalopram (LEXAPRO) 20 MG tablet Take 1 tablet (20 mg total) by mouth daily.  . furosemide (LASIX) 40 MG tablet Take 1 tablet (40 mg total) by mouth daily.  Marland Kitchen ibuprofen (ADVIL,MOTRIN) 800 MG tablet Take 800 mg by mouth every 8 (eight) hours as needed for mild pain.   Marland Kitchen ipratropium-albuterol (DUONEB)  0.5-2.5 (3) MG/3ML SOLN Use 1 vial via nebulizer every 4 t0 6 hrs  . Magnesium 400 MG TABS Take 400 mg by mouth daily.   . mometasone (NASONEX) 50 MCG/ACT nasal spray Place 2 sprays into the nose daily.  . mometasone-formoterol (DULERA) 200-5 MCG/ACT AERO Inhale 1 puff into the lungs 2 (two) times daily.  . montelukast (SINGULAIR) 10 MG tablet TAKE 1 TABLET BY MOUTH EVERY DAY FOR RHINITIS OR ALLERGIES  . omeprazole (PRILOSEC) 40 MG capsule Take 1 capsule (40 mg total) by mouth 2 (two) times daily.  . OXYGEN Inhale into the lungs. Oxygen at night, 2 liters  . pneumococcal 23 valent vaccine (PNEUMOVAX 23) 25 MCG/0.5ML injection Inject 0.49ml IM once  . Potassium Chloride CR (MICRO-K) 8 MEQ CPCR capsule CR TAKE 1 CAPSULE BY MOUTH DAILY. MAY TAKE EXTRA 2-3 TIMES A WEEK  . Roflumilast (DALIRESP) 250 MCG TABS Take 1 tablet by mouth daily.  . simvastatin (ZOCOR) 20 MG tablet TAKE 1 TABLET(20 MG) BY MOUTH EVERY EVENING FOR CHOLESTEROL  . SPIRIVA HANDIHALER 18 MCG inhalation capsule INHALE CONTENTS OF 1 CAPSULE ONCE DAILY USING HANDIHALER  . sucralfate (CARAFATE) 1 g tablet TAKE 1 TABLET BY MOUTH FOUR TIMES DAILY(WITH MEALS AND AT BEDTIME)  . vitamin B-12 (CYANOCOBALAMIN) 1000 MCG tablet Take 1,000 mcg by mouth daily.   No facility-administered encounter medications on file as of 12/05/2020.    Surgical History: Past Surgical History:  Procedure Laterality Date  . ESOPHAGOGASTRODUODENOSCOPY N/A 07/29/2018   Procedure: ESOPHAGOGASTRODUODENOSCOPY (EGD);  Surgeon:  Toledo, Benay Pike, MD;  Location: [facility] ENDOSCOPY;  Service: Gastroenterology;  Laterality: N/A;  . KIDNEY STONE SURGERY    . TONSILLECTOMY      Medical History: Past Medical History:  Diagnosis Date  . COPD (chronic obstructive pulmonary disease) ([place])   . Endometriosis   . GERD (gastroesophageal reflux disease)   . Hyperlipidemia   . Kidney stones     Family History: Family History  Problem Relation Age of Onset  . Hypertension  Mother   . Hyperlipidemia Mother   . GER disease Mother   . Heart attack Father        23 yrs ago  . Hypertension Father   . Parkinson's disease Father   . GER disease Father     Social History: Social History   Socioeconomic History  . Marital status: Married    Spouse name: Not on file  . Number of children: Not on file  . Years of education: Not on file  . Highest education level: Not on file  Occupational History  . Not on file  Tobacco Use  . Smoking status: Current Every Day Smoker    Packs/day: 1.00    Types: Cigarettes    Start date: 11/09/2017  . Smokeless tobacco: Never Used  . Tobacco comment: 1 pack a day   Vaping Use  . Vaping Use: Former  Substance and Sexual Activity  . Alcohol use: Yes    Comment: ocassionally   . Drug use: No  . Sexual activity: Not on file  Other Topics Concern  . Not on file  Social History Narrative  . Not on file   Social Determinants of Health   Financial Resource Strain: Not on file  Food Insecurity: Not on file  Transportation Needs: Not on file  Physical Activity: Not on file  Stress: Not on file  Social Connections: Not on file  Intimate Partner Violence: Not on file    Vital Signs: Blood pressure 109/72, pulse 68, temperature 97.8 F (36.6 C), resp. rate 16, height 5\' 4"  (1.626 m), weight 127 lb 9.6 oz (57.9 kg), SpO2 96 %.  Examination: General Appearance: The patient is well-developed, well-nourished, and in no distress. Skin: Gross inspection of skin unremarkable. Head: normocephalic, no gross deformities. Eyes: no gross deformities noted. ENT: ears appear grossly normal no exudates. Neck: Supple. No thyromegaly. No LAD. Respiratory: no rhonchi noted. Cardiovascular: Normal S1 and S2 without murmur or rub. Extremities: No cyanosis. pulses are equal. Neurologic: Alert and oriented. No involuntary movements.  LABS: No results found for this or any previous visit (from the past 2160  hour(s)).  Radiology: NM Hepato W/EjeCT Fract  Result Date: 08/19/2018 CLINICAL DATA:  Epigastric and upper abdominal pain EXAM: NUCLEAR MEDICINE HEPATOBILIARY IMAGING WITH GALLBLADDER EF VIEWS: Anterior, right lateral right upper quadrant RADIOPHARMACEUTICALS:  5.693 mCi Tc-101m  Choletec IV COMPARISON:  None. FINDINGS: Liver uptake of radiotracer is unremarkable. There is prompt visualization of gallbladder and small bowel, indicating patency of the cystic and common bile ducts. The patient consumed 8 ounces of Ensure orally with calculation of the computer generated ejection fraction of radiotracer from the gallbladder. The patient did not report clinical symptoms with the oral Ensure consumption. The computer generated ejection fraction of radiotracer from the gallbladder is normal at 54%, normal greater than 33% using the oral agent. IMPRESSION: Study within normal limits. Electronically Signed   By: Lowella Grip III M.D.   On: 08/19/2018 13:59    No results found.  No  results found.    Assessment and Plan: Patient Active Problem List   Diagnosis Date Noted  . Encounter for general adult medical examination with abnormal findings 09/17/2020  . Suprapubic abdominal pain 09/17/2020  . Encounter for screening mammogram for malignant neoplasm of breast 09/17/2020  . COPD with acute exacerbation ([place]) 05/12/2020  . Age-related osteoporosis without current pathological fracture 04/05/2020  . Need for vaccination against Streptococcus pneumoniae using pneumococcal conjugate vaccine 13 03/29/2019  . Cigarette smoker 11/26/2018  . Obstructive chronic bronchitis without exacerbation ([place]) 08/25/2018  . Gastroesophageal reflux disease without esophagitis 08/25/2018  . Black stool 08/25/2018  . Needs flu shot 08/25/2018  . Acute respiratory failure with hypoxemia ([place]) 05/21/2018  . Screening for osteoporosis 05/13/2018  . Acute upper respiratory infection 05/13/2018  . Other fatigue  05/13/2018  . Generalized anxiety disorder 05/13/2018  . Vitamin D deficiency 05/13/2018  . Screening for malignant neoplasm of cervix 05/13/2018  . Dysuria 05/13/2018  . Chest pain 01/18/2015  . Shortness of breath 01/18/2015  . Hyperlipemia 03/08/2014  . Pulmonary HTN ([place]) 03/08/2014  . Reactive airway disease 03/08/2014    1. COPD FEV1 is at 0.8L at this time still smoking needs to stop smoking 2. Smoker 3. PAH secondary to COPD 4. GERD under control 5. Oxygen dependent at night  General Counseling: I have discussed the findings of the evaluation and examination with Neoma Laming.  I have also discussed any further diagnostic evaluation thatmay be needed or ordered today. Diona verbalizes understanding of the findings of todays visit. We also reviewed her medications today and discussed drug interactions and side effects including but not limited excessive drowsiness and altered mental states. We also discussed that there is always a risk not just to her but also people around her. she has been encouraged to call the office with any questions or concerns that should arise related to todays visit.  Orders Placed This Encounter  Procedures  . Spirometry with Graph    Order Specific Question:   Where should this test be performed?    Answer:   [facility]     Time spent: ***  I have personally obtained a history, examined the patient, evaluated laboratory and imaging results, formulated the assessment and plan and placed orders.    Allyne Gee, MD [facility] Pulmonary and Critical Care Sleep medicine

## 2020-12-05 NOTE — Telephone Encounter (Signed)
Pt wanted to change her pharmacy.

## 2020-12-11 DIAGNOSIS — Z8616 Personal history of COVID-19: Secondary | ICD-10-CM | POA: Insufficient documentation

## 2020-12-13 ENCOUNTER — Other Ambulatory Visit: Payer: Self-pay | Admitting: Adult Health

## 2020-12-13 ENCOUNTER — Other Ambulatory Visit: Payer: Self-pay

## 2020-12-13 MED ORDER — FUROSEMIDE 40 MG PO TABS: 40.0000 mg | ORAL_TABLET | Freq: Every day | ORAL | 2 refills | Status: DC

## 2020-12-13 MED ORDER — SPIRIVA HANDIHALER 18 MCG IN CAPS: ORAL_CAPSULE | RESPIRATORY_TRACT | 5 refills | Status: DC

## 2020-12-22 ENCOUNTER — Other Ambulatory Visit: Payer: Self-pay

## 2020-12-22 MED ORDER — POTASSIUM CHLORIDE ER 8 MEQ PO CPCR: 8.0000 meq | ORAL_CAPSULE | Freq: Every day | ORAL | 3 refills | Status: DC

## 2021-01-01 ENCOUNTER — Other Ambulatory Visit: Payer: Self-pay

## 2021-01-01 MED ORDER — POTASSIUM CHLORIDE ER 8 MEQ PO CPCR: 8.0000 meq | ORAL_CAPSULE | Freq: Every day | ORAL | 3 refills | Status: DC

## 2021-01-02 ENCOUNTER — Other Ambulatory Visit: Payer: Self-pay

## 2021-01-02 DIAGNOSIS — K219 Gastro-esophageal reflux disease without esophagitis: Secondary | ICD-10-CM

## 2021-01-02 MED ORDER — OMEPRAZOLE 40 MG PO CPDR: 40.0000 mg | DELAYED_RELEASE_CAPSULE | Freq: Two times a day (BID) | ORAL | 1 refills | Status: DC

## 2021-01-06 ENCOUNTER — Other Ambulatory Visit: Payer: Self-pay | Admitting: Adult Health

## 2021-01-06 DIAGNOSIS — F411 Generalized anxiety disorder: Secondary | ICD-10-CM

## 2021-02-05 ENCOUNTER — Other Ambulatory Visit: Payer: Self-pay

## 2021-02-05 DIAGNOSIS — J449 Chronic obstructive pulmonary disease, unspecified: Secondary | ICD-10-CM

## 2021-02-05 MED ORDER — SPIRIVA HANDIHALER 18 MCG IN CAPS: ORAL_CAPSULE | RESPIRATORY_TRACT | 5 refills | Status: DC

## 2021-02-05 MED ORDER — DULERA 200-5 MCG/ACT IN AERO: 1.0000 | INHALATION_SPRAY | Freq: Two times a day (BID) | RESPIRATORY_TRACT | 3 refills | Status: DC

## 2021-02-09 ENCOUNTER — Ambulatory Visit (INDEPENDENT_AMBULATORY_CARE_PROVIDER_SITE_OTHER): Payer: Medicare Other | Admitting: Physician Assistant

## 2021-02-09 ENCOUNTER — Encounter: Payer: Self-pay | Admitting: Hospice and Palliative Medicine

## 2021-02-09 ENCOUNTER — Other Ambulatory Visit: Payer: Self-pay

## 2021-02-09 DIAGNOSIS — K219 Gastro-esophageal reflux disease without esophagitis: Secondary | ICD-10-CM

## 2021-02-09 DIAGNOSIS — S29011D Strain of muscle and tendon of front wall of thorax, subsequent encounter: Secondary | ICD-10-CM

## 2021-02-09 DIAGNOSIS — F411 Generalized anxiety disorder: Secondary | ICD-10-CM

## 2021-02-09 DIAGNOSIS — J449 Chronic obstructive pulmonary disease, unspecified: Secondary | ICD-10-CM | POA: Diagnosis not present

## 2021-02-09 NOTE — Progress Notes (Signed)
[facility] [place], [place] [address]  Internal MEDICINE  Office Visit Note  Patient Name: Rachael Blankenship  [id number]  [id number]  Date of Service: 02/13/2021  Chief Complaint  Patient presents with  . Follow-up  . Hyperlipidemia  . Gastroesophageal Reflux  . COPD    HPI -COPD well managaed on her current medications.  -She takes Omeprazole daily and her GERD is well controlled on this. Also will use Sucrafate as well. -Lexapro is doing well. Usually takes xanax only once per day at the low dose now. Sleeps well on this.  -She does have a history of some rib pain on L side that usually goes away with stretching. Husband picked her up and irritated it a little but it is not too bothersome and feels like it has before and always goes away on its own. She denies any known injury and has not had any falls. No concern for a fracture, just feels like a muscle strain. It is not tender to touch.  Current Medication: Outpatient Encounter Medications as of 02/09/2021  Medication Sig  . alendronate (FOSAMAX) 70 MG tablet Take 70 mg by mouth once a week. Take with a full glass of water on an empty stomach.  . ALPRAZolam (XANAX) 0.25 MG tablet Take 1 tablet (0.25 mg total) by mouth 3 (three) times daily as needed for anxiety.  Marland Kitchen ascorbic acid (VITAMIN C) 500 MG tablet Take 500 mg by mouth daily.  Marland Kitchen aspirin 81 MG tablet Take 81 mg by mouth at bedtime.   . Cholecalciferol 25 MCG (1000 UT) tablet Take 1,000 Units by mouth daily.   . Coenzyme Q10 (CO Q-10) 100 MG CAPS Take 200 mg by mouth daily.   . COMBIVENT RESPIMAT 20-100 MCG/ACT AERS respimat INHALE 1 PUFF INTO THE LUNGS EVERY 6 HOURS AS NEEDED FOR WHEEZING OR SHORTNESS OF BREATH  . escitalopram (LEXAPRO) 20 MG tablet Take 1 tablet (20 mg total) by mouth daily.  . furosemide (LASIX) 40 MG tablet Take 1 tablet (40 mg total) by mouth daily.  Marland Kitchen ibuprofen (ADVIL,MOTRIN) 800 MG tablet Take 800 mg by mouth every 8 (eight)  hours as needed for mild pain.   Marland Kitchen ipratropium-albuterol (DUONEB) 0.5-2.5 (3) MG/3ML SOLN Use 1 vial via nebulizer every 4 t0 6 hrs  . Magnesium 400 MG TABS Take 400 mg by mouth daily.   . mometasone (NASONEX) 50 MCG/ACT nasal spray Place 2 sprays into the nose daily.  . mometasone-formoterol (DULERA) 200-5 MCG/ACT AERO Inhale 1 puff into the lungs 2 (two) times daily.  . montelukast (SINGULAIR) 10 MG tablet Take 1 tablet (10 mg total) by mouth daily.  Marland Kitchen omeprazole (PRILOSEC) 40 MG capsule Take 1 capsule (40 mg total) by mouth 2 (two) times daily.  . OXYGEN Inhale into the lungs. Oxygen at night, 2 liters  . pneumococcal 23 valent vaccine (PNEUMOVAX 23) 25 MCG/0.5ML injection Inject 0.23ml IM once  . Potassium Chloride CR (MICRO-K) 8 MEQ CPCR capsule CR Take 1 capsule (8 mEq total) by mouth daily.  . Roflumilast (DALIRESP) 250 MCG TABS Take 1 tablet by mouth daily.  . simvastatin (ZOCOR) 20 MG tablet TAKE 1 TABLET(20 MG) BY MOUTH EVERY EVENING FOR CHOLESTEROL  . sucralfate (CARAFATE) 1 g tablet TAKE 1 TABLET BY MOUTH FOUR TIMES DAILY(WITH MEALS AND AT BEDTIME)  . tiotropium (SPIRIVA HANDIHALER) 18 MCG inhalation capsule INHALE CONTENTS OF 1 CAPSULE ONCE DAILY USING HANDIHALER  . vitamin B-12 (CYANOCOBALAMIN) 1000 MCG tablet Take 1,000  mcg by mouth daily.   No facility-administered encounter medications on file as of 02/09/2021.    Surgical History: Past Surgical History:  Procedure Laterality Date  . ESOPHAGOGASTRODUODENOSCOPY N/A 07/29/2018   Procedure: ESOPHAGOGASTRODUODENOSCOPY (EGD);  Surgeon: Toledo, Benay Pike, MD;  Location: [facility] ENDOSCOPY;  Service: Gastroenterology;  Laterality: N/A;  . KIDNEY STONE SURGERY    . TONSILLECTOMY      Medical History: Past Medical History:  Diagnosis Date  . COPD (chronic obstructive pulmonary disease) ([place])   . Endometriosis   . GERD (gastroesophageal reflux disease)   . Hyperlipidemia   . Kidney stones     Family History: Family History   Problem Relation Age of Onset  . Hypertension Mother   . Hyperlipidemia Mother   . GER disease Mother   . Heart attack Father        23 yrs ago  . Hypertension Father   . Parkinson's disease Father   . GER disease Father     Social History   Socioeconomic History  . Marital status: Married    Spouse name: Not on file  . Number of children: Not on file  . Years of education: Not on file  . Highest education level: Not on file  Occupational History  . Not on file  Tobacco Use  . Smoking status: Current Every Day Smoker    Packs/day: 1.00    Types: Cigarettes    Start date: 11/09/2017  . Smokeless tobacco: Never Used  . Tobacco comment: 1 pack a day   Vaping Use  . Vaping Use: Former  Substance and Sexual Activity  . Alcohol use: Yes    Comment: ocassionally   . Drug use: No  . Sexual activity: Not on file  Other Topics Concern  . Not on file  Social History Narrative  . Not on file   Social Determinants of Health   Financial Resource Strain: Not on file  Food Insecurity: Not on file  Transportation Needs: Not on file  Physical Activity: Not on file  Stress: Not on file  Social Connections: Not on file  Intimate Partner Violence: Not on file      Review of Systems  Constitutional: Negative for chills, fatigue and unexpected weight change.  HENT: Negative for congestion, postnasal drip, rhinorrhea, sneezing and sore throat.   Eyes: Negative for redness.  Respiratory: Negative for cough, chest tightness and shortness of breath.   Cardiovascular: Negative for chest pain and palpitations.  Gastrointestinal: Negative for abdominal pain, constipation, diarrhea, nausea and vomiting.       Reflux  Genitourinary: Negative for dysuria and frequency.  Musculoskeletal: Positive for myalgias. Negative for arthralgias, back pain, joint swelling and neck pain.       Occasional muscle strain over L lower ribs  Skin: Negative for rash.  Neurological: Negative.  Negative  for tremors and numbness.  Hematological: Negative for adenopathy. Does not bruise/bleed easily.  Psychiatric/Behavioral: Positive for sleep disturbance. Negative for behavioral problems (Depression) and suicidal ideas. The patient is nervous/anxious.     Vital Signs: BP 122/64   Pulse 70   Temp (!) 97.4 F (36.3 C)   Resp 16   Ht 5\' 4"  (1.626 m)   Wt 125 lb 9.6 oz (57 kg)   SpO2 98%   BMI 21.56 kg/m    Physical Exam Constitutional:      General: She is not in acute distress.    Appearance: She is well-developed and normal weight. She is not diaphoretic.  HENT:     Head: Normocephalic and atraumatic.     Mouth/Throat:     Pharynx: No oropharyngeal exudate.  Eyes:     Pupils: Pupils are equal, round, and reactive to light.  Neck:     Thyroid: No thyromegaly.     Vascular: No JVD.     Trachea: No tracheal deviation.  Cardiovascular:     Rate and Rhythm: Normal rate and regular rhythm.     Heart sounds: Normal heart sounds. No murmur heard. No friction rub. No gallop.   Pulmonary:     Effort: Pulmonary effort is normal. No respiratory distress.     Breath sounds: No wheezing or rales.  Chest:     Chest wall: No tenderness.  Abdominal:     General: Bowel sounds are normal.     Palpations: Abdomen is soft.     Tenderness: There is no abdominal tenderness.  Musculoskeletal:        General: Normal range of motion.     Cervical back: Normal range of motion and neck supple.     Comments: Muscle strain over L lower rib that is noticeable sometimes with stretching L arm up, non TTP and not currently bothersome  Lymphadenopathy:     Cervical: No cervical adenopathy.  Skin:    General: Skin is warm and dry.  Neurological:     Mental Status: She is alert and oriented to person, place, and time.     Cranial Nerves: No cranial nerve deficit.  Psychiatric:        Behavior: Behavior normal.        Thought Content: Thought content normal.        Judgment: Judgment normal.         Assessment/Plan: 1. Chronic obstructive pulmonary disease, unspecified COPD type ([place]) Stable, continue current management and f/u with pulmonology.  2. GERD without esophagitis Continue omeprazole.  3. Generalized anxiety disorder Continue lexapro daily. Pt may continue to take .25mg  xanax as needed. She has cut down to 1 at night to help her sleep and will only take a second dose during the day if feeling very anxious.  4. Muscle strain of chest wall, subsequent encounter Continue to stretch. She will let us know if symptoms worsen or is not improving.   General Counseling: dafne nield understanding of the findings of todays visit and agrees with plan of treatment. I have discussed any further diagnostic evaluation that may be needed or ordered today. We also reviewed her medications today. she has been encouraged to call the office with any questions or concerns that should arise related to todays visit.    No orders of the defined types were placed in this encounter.   No orders of the defined types were placed in this encounter.   This patient was seen by Drema Dallas, PA-C in collaboration with Dr. Clayborn Bigness as a part of collaborative care agreement.   Total time spent:30 Minutes Time spent includes review of chart, medications, test results, and follow up plan with the patient.      Dr Lavera Guise Internal medicine

## 2021-02-13 ENCOUNTER — Other Ambulatory Visit: Payer: Self-pay | Admitting: Adult Health

## 2021-02-13 DIAGNOSIS — J449 Chronic obstructive pulmonary disease, unspecified: Secondary | ICD-10-CM

## 2021-03-14 ENCOUNTER — Other Ambulatory Visit: Payer: Self-pay | Admitting: Internal Medicine

## 2021-03-15 ENCOUNTER — Other Ambulatory Visit: Payer: Self-pay | Admitting: Hospice and Palliative Medicine

## 2021-03-26 ENCOUNTER — Telehealth: Payer: Self-pay | Admitting: Internal Medicine

## 2021-03-26 NOTE — Progress Notes (Signed)
  Chronic Care Management   Note  03/26/2021 Name: [name] MRN: [id number] DOB: Mar 13, 1956  Rachael Blankenship is a 65 y.o. year old female who is a primary care patient of Lavera Guise, MD. I reached out to Harl Favor by phone today in response to a referral sent by [name]'s PCP, Lavera Guise, MD.   Ms. Anzaldo was given information about Chronic Care Management services today including:  1. CCM service includes personalized support from designated clinical staff supervised by her physician, including individualized plan of care and coordination with other care providers 2. 24/7 contact phone numbers for assistance for urgent and routine care needs. 3. Service will only be billed when office clinical staff spend 20 minutes or more in a month to coordinate care. 4. Only one practitioner may furnish and bill the service in a calendar month. 5. The patient may stop CCM services at any time (effective at the end of the month) by phone call to the office staff.   Patient agreed to services and verbal consent obtained.   Follow up plan:   Carley Perdue [employer] Scheduler

## 2021-04-19 ENCOUNTER — Other Ambulatory Visit: Payer: Self-pay | Admitting: Nurse Practitioner

## 2021-04-19 ENCOUNTER — Other Ambulatory Visit: Payer: Self-pay | Admitting: Internal Medicine

## 2021-04-19 ENCOUNTER — Other Ambulatory Visit: Payer: Self-pay | Admitting: Adult Health

## 2021-04-19 DIAGNOSIS — J449 Chronic obstructive pulmonary disease, unspecified: Secondary | ICD-10-CM

## 2021-04-19 DIAGNOSIS — F411 Generalized anxiety disorder: Secondary | ICD-10-CM

## 2021-04-20 ENCOUNTER — Other Ambulatory Visit: Payer: Self-pay | Admitting: Adult Health

## 2021-04-20 DIAGNOSIS — J449 Chronic obstructive pulmonary disease, unspecified: Secondary | ICD-10-CM

## 2021-05-01 ENCOUNTER — Other Ambulatory Visit: Payer: Self-pay

## 2021-05-01 DIAGNOSIS — J449 Chronic obstructive pulmonary disease, unspecified: Secondary | ICD-10-CM

## 2021-05-01 MED ORDER — DALIRESP 250 MCG PO TABS: 1.0000 | ORAL_TABLET | Freq: Every day | ORAL | 4 refills | Status: DC

## 2021-05-08 ENCOUNTER — Other Ambulatory Visit: Payer: Self-pay

## 2021-05-15 ENCOUNTER — Telehealth: Payer: Self-pay | Admitting: Pharmacist

## 2021-05-15 NOTE — Progress Notes (Addendum)
Chronic Care Management Pharmacy Assistant   Name: Rachael Blankenship  MRN: [id number] DOB: November 13, 1956  Rachael Blankenship is an 65 y.o. year old female who presents for his initial CCM visit with the clinical pharmacist.  Reason for Encounter: Chart Prep   Conditions to be addressed/monitored: COPD, GERD, Vitamin D Deficiency, HLD.  Primary concerns for visit include: COPD  Recent office visits:  02/09/21 Mylinda Latina, PA-C. No medication changes.  12/05/20 Dr. Humphrey Rolls For follow-up. No medication changes.   Recent consult visits:  None in the last 6 months  Hospital visits:  None in previous 6 months  Medication History: Simvastatin 20 mg 90 DS 03/14/21  Medications: Outpatient Encounter Medications as of 05/15/2021  Medication Sig   alendronate (FOSAMAX) 70 MG tablet Take 70 mg by mouth once a week. Take with a full glass of water on an empty stomach.   ALPRAZolam (XANAX) 0.25 MG tablet Take 1 tablet (0.25 mg total) by mouth 3 (three) times daily as needed for anxiety.   ascorbic acid (VITAMIN C) 500 MG tablet Take 500 mg by mouth daily.   aspirin 81 MG tablet Take 81 mg by mouth at bedtime.    Cholecalciferol 25 MCG (1000 UT) tablet Take 1,000 Units by mouth daily.    Coenzyme Q10 (CO Q-10) 100 MG CAPS Take 200 mg by mouth daily.    COMBIVENT RESPIMAT 20-100 MCG/ACT AERS respimat INHALE 1 PUFF INTO THE LUNGS EVERY 6 HOURS AS NEEDED FOR WHEEZING OR SHORTNESS OF BREATH   escitalopram (LEXAPRO) 20 MG tablet Take 1 tablet (20 mg total) by mouth daily.   furosemide (LASIX) 40 MG tablet TAKE 1 TABLET( 40 MG TOTAL) BY MOUTH DAILY   ibuprofen (ADVIL,MOTRIN) 800 MG tablet Take 800 mg by mouth every 8 (eight) hours as needed for mild pain.    ipratropium-albuterol (DUONEB) 0.5-2.5 (3) MG/3ML SOLN Use 1 vial via nebulizer every 4 t0 6 hrs   Magnesium 400 MG TABS Take 400 mg by mouth daily.    mometasone (NASONEX) 50 MCG/ACT nasal spray Place 2 sprays into the nose daily.    mometasone-formoterol (DULERA) 200-5 MCG/ACT AERO Inhale 1 puff into the lungs 2 (two) times daily.   montelukast (SINGULAIR) 10 MG tablet Take 1 tablet (10 mg total) by mouth daily.   omeprazole (PRILOSEC) 40 MG capsule Take 1 capsule (40 mg total) by mouth 2 (two) times daily.   OXYGEN Inhale into the lungs. Oxygen at night, 2 liters   pneumococcal 23 valent vaccine (PNEUMOVAX 23) 25 MCG/0.5ML injection Inject 0.95ml IM once   Potassium Chloride CR (MICRO-K) 8 MEQ CPCR capsule CR Take 1 capsule (8 mEq total) by mouth daily.   Roflumilast (DALIRESP) 250 MCG TABS Take 1 tablet by mouth daily.   simvastatin (ZOCOR) 20 MG tablet TAKE 1 TABLET(20 MG) BY MOUTH EVERY EVENING FOR CHOLESTEROL   sucralfate (CARAFATE) 1 g tablet TAKE 1 TABLET BY MOUTH FOUR TIMES DAILY AT BEDTIME WITH MEALS   tiotropium (SPIRIVA HANDIHALER) 18 MCG inhalation capsule INHALE CONTENTS OF 1 CAPSULE ONCE DAILY USING HANDIHALER   vitamin B-12 (CYANOCOBALAMIN) 1000 MCG tablet Take 1,000 mcg by mouth daily.   No facility-administered encounter medications on file as of 05/15/2021.    Have you seen any other providers since your last visit? Patient stated no.  Any changes in your medications or health? Patient stated no.  Any side effects from any medications? Patient stated no.  Do you have an symptoms or problems not  managed by your medications? Patient stated she has been very fatigue.   Any concerns about your health right now? Patient stated no.   Has your provider asked that you check blood pressure, blood sugar, or follow special diet at home? Patient stated no but she checks her blood pressure at home sometimes.   Do you get any type of exercise on a regular basis? Patient stated no.  Can you think of a goal you would like to reach for your health? Patient stated she would like to like to quit smoking or at least cut back on smoking.  Do you have any problems getting your medications? Patient stated no.  Is there  anything that you would like to discuss during the appointment? Patient stated no.  Please bring medications and supplements to appointment, patient reminded of her face to face appointment on 05/16/21 at 130 pm.   Follow-Up:Pharmacist Review  Charlann Lange, [place] Pharmacist Assistant [phone]

## 2021-05-15 NOTE — Progress Notes (Deleted)
Chronic Care Management Pharmacy Note  05/15/2021 Name:  Rachael Blankenship MRN:  [id number] DOB:  [birth year]  Summary:   Recommendations/Changes made from today's visit:   Plan:    Subjective: Rachael Blankenship is an 65 y.o. year old female who is a primary patient of Humphrey Rolls, Timoteo Gaul, MD.  The CCM team was consulted for assistance with disease management and care coordination needs.    Engaged with patient face to face for initial visit in response to provider referral for pharmacy case management and/or care coordination services.   Consent to Services:  The patient was given the following information about Chronic Care Management services today, agreed to services, and gave verbal consent: 1. CCM service includes personalized support from designated clinical staff supervised by the primary care provider, including individualized plan of care and coordination with other care providers 2. 24/7 contact phone numbers for assistance for urgent and routine care needs. 3. Service will only be billed when office clinical staff spend 20 minutes or more in a month to coordinate care. 4. Only one practitioner may furnish and bill the service in a calendar month. 5.The patient may stop CCM services at any time (effective at the end of the month) by phone call to the office staff. 6. The patient will be responsible for cost sharing (co-pay) of up to 20% of the service fee (after annual deductible is met). Patient agreed to services and consent obtained.  Patient Care Team: Lavera Guise, MD as PCP - General (Internal Medicine) Edythe Clarity, [facility] as Pharmacist (Pharmacist)  Recent office visits:  02/09/21 Mylinda Latina, PA-C. No medication changes. 12/05/20 Dr. Humphrey Rolls For follow-up. No medication changes.    Recent consult visits:  None in the last 6 months   Hospital visits:  None in previous 6 months   Medication History: Simvastatin 20 mg 90 DS 03/14/21   Objective:  Lab Results   Component Value Date   CREATININE 0.64 09/05/2020   BUN 12 09/05/2020   GFRNONAA 93 09/05/2020   GFRAA 108 09/05/2020   NA 141 09/05/2020   K 4.4 09/05/2020   CALCIUM 9.3 09/05/2020   CO2 30 (H) 09/05/2020   GLUCOSE 100 (H) 09/05/2020    Lab Results  Component Value Date/Time   HGBA1C 6.2 (H) 05/22/2018 12:02 AM    Last diabetic Eye exam: No results found for: HMDIABEYEEXA  Last diabetic Foot exam: No results found for: HMDIABFOOTEX   Lab Results  Component Value Date   CHOL 177 09/05/2020   HDL 61 09/05/2020   LDLCALC 102 (H) 09/05/2020   TRIG 77 09/05/2020    Hepatic Function Latest Ref Rng & Units 09/05/2020 03/29/2019 05/21/2018  Total Protein 6.0 - 8.5 g/dL 6.3 6.4 7.1  Albumin 3.8 - 4.8 g/dL 4.3 4.4 3.6  AST 0 - 40 IU/L 20 30 21   ALT 0 - 32 IU/L 20 25 14   Alk Phosphatase 44 - 121 IU/L 102 101 86  Total Bilirubin 0.0 - 1.2 mg/dL 0.3 0.5 1.2    Lab Results  Component Value Date/Time   TSH 0.911 09/05/2020 10:32 AM   TSH 0.992 03/29/2019 03:06 PM   FREET4 1.25 09/05/2020 10:32 AM   FREET4 1.17 03/29/2019 03:06 PM    CBC Latest Ref Rng & Units 09/05/2020 03/29/2019 05/21/2018  WBC 3.4 - 10.8 x10E3/uL 6.9 9.1 19.1(H)  Hemoglobin 11.1 - 15.9 g/dL 14.6 14.9 15.3  Hematocrit 34.0 - 46.6 % 44.5 43.1 45.2  Platelets 150 -  450 x10E3/uL 283 328 232    Lab Results  Component Value Date/Time   VD25OH 39.8 09/05/2020 10:32 AM   VD25OH 55.4 03/29/2019 03:06 PM    Clinical ASCVD: {YES/NO:21197} The 10-year ASCVD risk score Mikey Bussing DC Jr., et al., 2013) is: 10%   Values used to calculate the score:     Age: 1 years     Sex: Female     Is Non-Hispanic African American: No     Diabetic: No     Tobacco smoker: Yes     Systolic Blood Pressure: 616 mmHg     Is BP treated: No     HDL Cholesterol: 61 mg/dL     Total Cholesterol: 177 mg/dL    Depression screen [facility] 2/9 02/09/2021 10/02/2020 08/29/2020  Decreased Interest 0 0 0  Down, Depressed, Hopeless 0 0 0  PHQ - 2  Score 0 0 0  Altered sleeping - - -  Tired, decreased energy - - -  Change in appetite - - -  Feeling bad or failure about yourself  - - -  Trouble concentrating - - -  Moving slowly or fidgety/restless - - -  Suicidal thoughts - - -  PHQ-9 Score - - -     ***Other: (CHADS2VASc if Afib, MMRC or CAT for COPD, ACT, DEXA)  Social History   Tobacco Use  Smoking Status Every Day   Packs/day: 1.00   Pack years: 0.00   Types: Cigarettes   Start date: 11/09/2017  Smokeless Tobacco Never  Tobacco Comments   1 pack a day    BP Readings from Last 3 Encounters:  02/09/21 122/64  12/05/20 109/72  11/07/20 120/64   Pulse Readings from Last 3 Encounters:  02/09/21 70  12/05/20 68  11/07/20 81   Wt Readings from Last 3 Encounters:  02/09/21 125 lb 9.6 oz (57 kg)  12/05/20 127 lb 9.6 oz (57.9 kg)  11/07/20 129 lb 6.4 oz (58.7 kg)   BMI Readings from Last 3 Encounters:  02/09/21 21.56 kg/m  12/05/20 21.90 kg/m  11/07/20 22.21 kg/m    Assessment/Interventions: Review of patient past medical history, allergies, medications, health status, including review of consultants reports, laboratory and other test data, was performed as part of comprehensive evaluation and provision of chronic care management services.   SDOH:  (Social Determinants of Health) assessments and interventions performed: {yes/no:20286}  SDOH Screenings   Alcohol Screen: Low Risk    Last Alcohol Screening Score (AUDIT): 1  Depression (PHQ2-9): Low Risk    PHQ-2 Score: 0  Financial Resource Strain: Not on file  Food Insecurity: Not on file  Housing: Not on file  Physical Activity: Not on file  Social Connections: Not on file  Stress: Not on file  Tobacco Use: High Risk   Smoking Tobacco Use: Every Day   Smokeless Tobacco Use: Never  Transportation Needs: Not on file    [place]  Allergies  Allergen Reactions   Naproxen Itching   Nsaids Other (See Comments)    Other reaction(s): Unknown    Sulfa Antibiotics Hives and Rash    Medications Reviewed Today     Reviewed by Jimmye Norman, CMA (Certified Medical Assistant) on 02/09/21 at 1141  Med List Status: <None>   Medication Order Taking? Sig Documenting Provider Last Dose Status Informant  alendronate (FOSAMAX) 70 MG tablet [id number] No Take 70 mg by mouth once a week. Take with a full glass of water on an empty stomach. [provider] Taking Active   ALPRAZolam (XANAX) 0.25 MG tablet [id number] No Take 1 tablet (0.25 mg total) by mouth 3 (three) times daily as needed for anxiety. Ronnell Freshwater, NP Taking Active   ascorbic acid (VITAMIN C) 500 MG tablet [id number] No Take 500 mg by mouth daily. [provider] Taking Active Self  aspirin 81 MG tablet [id number] No Take 81 mg by mouth at bedtime.  [provider] Taking Active Self  Cholecalciferol 25 MCG (1000 UT) tablet [id number] No Take 1,000 Units by mouth daily.  [provider] Taking Active Self  Coenzyme Q10 (CO Q-10) 100 MG CAPS [id number] No Take 200 mg by mouth daily.  [provider] Taking Active Self  COMBIVENT RESPIMAT 20-100 MCG/ACT AERS respimat [id number] No INHALE 1 PUFF INTO THE LUNGS EVERY 6 HOURS AS NEEDED FOR WHEEZING OR SHORTNESS OF BREATH [place], Audie Clear, NP Taking Active   escitalopram (LEXAPRO) 20 MG tablet [id number] No Take 1 tablet (20 mg total) by mouth daily. Ronnell Freshwater, NP Taking Active   furosemide (LASIX) 40 MG tablet [id number]  Take 1 tablet (40 mg total) by mouth daily. Luiz Ochoa, NP  Active   ibuprofen (ADVIL,MOTRIN) 800 MG tablet [id number] No Take 800 mg by mouth every 8 (eight) hours as needed for mild pain.  [provider] Taking Active Self  ipratropium-albuterol (DUONEB) 0.5-2.5 (3) MG/3ML SOLN [id number] No Use 1 vial via nebulizer every 4 t0 6 hrs Ronnell Freshwater, NP Taking Active   Magnesium 400 MG TABS [id number] No Take 400 mg by mouth daily.  [provider] Taking Active Self  mometasone (NASONEX) 50 MCG/ACT nasal spray [id number] No Place 2 sprays into the nose daily. [provider] Taking Active   mometasone-formoterol (DULERA) 200-5 MCG/ACT AERO [id number]  Inhale 1 puff into the lungs 2 (two) times daily. Lavera Guise, MD  Active   montelukast (SINGULAIR) 10 MG tablet [id number]  Take 1 tablet (10 mg total) by mouth daily. Allyne Gee, MD  Active   omeprazole (PRILOSEC) 40 MG capsule [id number]  Take 1 capsule (40 mg total) by mouth 2 (two) times daily. Lavera Guise, MD  Active   OXYGEN [id number] No Inhale into the lungs. Oxygen at night, 2 liters [provider] Taking Active   pneumococcal 23 valent vaccine (PNEUMOVAX 23) 25 MCG/0.5ML injection [id number] No Inject 0.78m IM once BRonnell Freshwater NP Taking Active   Potassium Chloride CR (MICRO-K) 8 MEQ CPCR capsule CR [id number] Take 1 capsule (8 mEq total) by mouth daily. KLavera Guise MD  Active   Roflumilast (DALIRESP) 250 MCG TABS [id number] Take 1 tablet by mouth daily. KAllyne Gee MD  Active   simvastatin (ZOCOR) 20 MG tablet [id number] TAKE 1 TABLET(20 MG) BY MOUTH EVERY EVENING FOR CHOLESTEROL KLavera Guise MD Taking Active   sucralfate (CARAFATE) 1 g tablet [id number] TAKE 1 TABLET BY MOUTH FOUR TIMES DAILY(WITH MEALS AND AT BEDTIME) KLavera Guise MD Taking Active   tiotropium ([facility]) 18 MCG inhalation capsule [id number] INHALE CONTENTS OF 1 CAPSULE ONCE DAILY USING HPayton Mccallum MD  Active   vitamin B-12 (CYANOCOBALAMIN) 1000 MCG tablet [id number] Take 1,000 mcg by mouth daily. [provider] Taking Active Self            Patient Active Problem List   Diagnosis Date Noted   History of COVID-19 12/11/2020   Encounter for general adult  medical examination with abnormal findings 09/17/2020   Suprapubic abdominal pain 09/17/2020   Encounter for screening mammogram for malignant neoplasm of breast  09/17/2020   Chronic obstructive pulmonary disease ([place]) 05/12/2020   Age-related osteoporosis without current pathological fracture 04/05/2020   Need for vaccination against Streptococcus pneumoniae using pneumococcal conjugate vaccine 13 03/29/2019   Cigarette smoker 11/26/2018   Obstructive chronic bronchitis without exacerbation ([place]) 08/25/2018   Gastroesophageal reflux disease without esophagitis 08/25/2018   Black stool 08/25/2018   Needs flu shot 08/25/2018   Acute respiratory failure with hypoxemia ([place]) 05/21/2018   Screening for osteoporosis 05/13/2018   Acute upper respiratory infection 05/13/2018   Other fatigue 05/13/2018   Generalized anxiety disorder 05/13/2018   Vitamin D deficiency 05/13/2018   Screening for malignant neoplasm of cervix 05/13/2018   Dysuria 05/13/2018   Chest pain 01/18/2015   Shortness of breath 01/18/2015   Hyperlipemia 03/08/2014   Pulmonary HTN ([place]) 03/08/2014   Reactive airway disease 03/08/2014    Immunization History  Administered Date(s) Administered   Influenza Inj Mdck Quad Pf 08/24/2018, 08/29/2020   Influenza,inj,Quad PF,6+ Mos 09/21/2017   Pneumococcal Polysaccharide-23 08/28/2020    Conditions to be addressed/monitored:  COPD, GERD, Osteoporosis, GAD, HLD  There are no care plans that you recently modified to display for this patient.    Medication Assistance: {MEDASSISTANCEINFO:25044}  Compliance/Adherence/Medication fill history: Care Gaps:   Star-Rating Drugs:   Patient's preferred pharmacy is:  [facility], [address] [place] [address] Phone: [phone] Fax: [place], [place] [address] [place] [address] Phone: [phone] Fax: [facility] [id number], [address] [address] Phone:  [phone] Fax: [phone]  Uses pill box? {Yes or If no, why not?:20788} Pt endorses ***% compliance  We discussed: {Pharmacy options:24294} Patient decided to: {US Pharmacy Plan:23885}  Care Plan and Follow Up Patient Decision:  {FOLLOWUP:24991}  Plan: {CM FOLLOW UP WOEH:21224}      Current Barriers:  {pharmacybarriers:24917}  Pharmacist Clinical Goal(s):  Patient will {PHARMACYGOALCHOICES:24921} through collaboration with PharmD and provider.   Interventions: 1:1 collaboration with Lavera Guise, MD regarding development and update of comprehensive plan of care as evidenced by provider attestation and co-signature Inter-disciplinary care team collaboration (see longitudinal plan of care) Comprehensive medication review performed; medication list updated in electronic medical record  Hyperlipidemia: (LDL goal < ***) -{US controlled/uncontrolled:25276} -Current treatment:  -Medications previously tried: ***  -Current dietary patterns: *** -Current exercise habits: *** -Educated on {CCM HLD Counseling:25126} -{CCMPHARMDINTERVENTION:25122}  COPD (Goal: control symptoms and prevent exacerbations) -{US controlled/uncontrolled:25276} -Current treatment   -Medications previously tried: ***  -Gold Grade: {CHL HP Upstream Pharm COPD Gold MGNOI:[id number]} -Current COPD Classification:  {CHL HP Upstream Pharm COPD Classification:[phone]} -MMRC/CAT score: *** -Pulmonary function testing: *** -Exacerbations requiring treatment in last 6 months: *** -Patient {Actions; denies-reports:120008} consistent use of maintenance inhaler -Frequency of rescue inhaler use: *** -Counseled on {CCMINHALERCOUNSELING:25121} -{CCMPHARMDINTERVENTION:25122}  Depression/Anxiety (Goal: ***) -{US controlled/uncontrolled:25276} -Current treatment:  -Medications previously tried/failed: *** -PHQ9: *** -GAD7: *** -Connected with *** for mental health support -Educated on {CCM mental  health counseling:25127} -{CCMPHARMDINTERVENTION:25122}  Osteoporosis / Osteopenia (Goal ***) -{US controlled/uncontrolled:25276} -Last DEXA Scan: ***   T-Score femoral neck: ***  T-Score total hip: ***  T-Score  lumbar spine: ***  T-Score forearm radius: ***  10-year probability of major osteoporotic fracture: ***  10-year probability of hip fracture: *** -Patient {is;is not an osteoporosis candidate:23886} -Current treatment   -Medications previously tried: ***  -{Osteoporosis Counseling:23892} -{CCMPHARMDINTERVENTION:25122}  GERD (Goal: ***) -{US controlled/uncontrolled:25276} -Current treatment   -Medications previously tried: ***  -{CCMPHARMDINTERVENTION:25122}  Patient Goals/Self-Care Activities Patient will:  - {pharmacypatientgoals:24919}  Follow Up Plan: {CM FOLLOW UP JJOA:41660}

## 2021-05-16 ENCOUNTER — Ambulatory Visit (INDEPENDENT_AMBULATORY_CARE_PROVIDER_SITE_OTHER): Payer: Medicare Other | Admitting: Nurse Practitioner

## 2021-05-16 ENCOUNTER — Other Ambulatory Visit: Payer: Self-pay

## 2021-05-16 ENCOUNTER — Ambulatory Visit: Payer: Medicare Other

## 2021-05-16 ENCOUNTER — Encounter: Payer: Self-pay | Admitting: Nurse Practitioner

## 2021-05-16 VITALS — BP 120/65 | HR 86 | Temp 98.6°F | Resp 16 | Ht 64.0 in | Wt 127.4 lb

## 2021-05-16 DIAGNOSIS — M810 Age-related osteoporosis without current pathological fracture: Secondary | ICD-10-CM

## 2021-05-16 DIAGNOSIS — F411 Generalized anxiety disorder: Secondary | ICD-10-CM | POA: Diagnosis not present

## 2021-05-16 DIAGNOSIS — J449 Chronic obstructive pulmonary disease, unspecified: Secondary | ICD-10-CM

## 2021-05-16 DIAGNOSIS — K219 Gastro-esophageal reflux disease without esophagitis: Secondary | ICD-10-CM | POA: Diagnosis not present

## 2021-05-16 MED ORDER — ALPRAZOLAM 0.25 MG PO TABS: 0.2500 mg | ORAL_TABLET | Freq: Three times a day (TID) | ORAL | 2 refills | Status: DC | PRN

## 2021-05-16 NOTE — Progress Notes (Signed)
[facility] [place],  [address]  Internal MEDICINE  Office Visit Note  Patient Name: Rachael Blankenship  [id number]  [id number]  Date of Service: 05/25/2021  Chief Complaint  Patient presents with   Follow-up    Anxiety follow up,   COPD   Depression    HPI Rachael Blankenship presents for a follow up visit for anxiety, COPD and depression. She has a history of hyperlipidemia, kidney disease, endometriosis, and GERD.  Anxiety and depression are stable, taking escitalopram and alprazolam.  -COPD: using combivent, Spiriva, and dulera. Also taking daliresp.  -takes omeprazole for GERD.     Current Medication: Outpatient Encounter Medications as of 05/16/2021  Medication Sig   alendronate (FOSAMAX) 70 MG tablet Take 70 mg by mouth once a week. Take with a full glass of water on an empty stomach.   ascorbic acid (VITAMIN C) 500 MG tablet Take 500 mg by mouth daily.   aspirin 81 MG tablet Take 81 mg by mouth at bedtime.    Cholecalciferol 25 MCG (1000 UT) tablet Take 1,000 Units by mouth daily.    Coenzyme Q10 (CO Q-10) 100 MG CAPS Take 200 mg by mouth daily.    COMBIVENT RESPIMAT 20-100 MCG/ACT AERS respimat INHALE 1 PUFF INTO THE LUNGS EVERY 6 HOURS AS NEEDED FOR WHEEZING OR SHORTNESS OF BREATH   escitalopram (LEXAPRO) 20 MG tablet Take 1 tablet (20 mg total) by mouth daily.   furosemide (LASIX) 40 MG tablet TAKE 1 TABLET( 40 MG TOTAL) BY MOUTH DAILY   ipratropium-albuterol (DUONEB) 0.5-2.5 (3) MG/3ML SOLN Use 1 vial via nebulizer every 4 t0 6 hrs   Magnesium 400 MG TABS Take 400 mg by mouth daily.    mometasone (NASONEX) 50 MCG/ACT nasal spray Place 2 sprays into the nose daily.   mometasone-formoterol (DULERA) 200-5 MCG/ACT AERO Inhale 1 puff into the lungs 2 (two) times daily.   montelukast (SINGULAIR) 10 MG tablet Take 1 tablet (10 mg total) by mouth daily.   omeprazole (PRILOSEC) 40 MG capsule Take 1 capsule (40 mg total) by mouth 2 (two) times daily.    OXYGEN Inhale into the lungs. Oxygen at night, 2 liters   pneumococcal 23 valent vaccine (PNEUMOVAX 23) 25 MCG/0.5ML injection Inject 0.56ml IM once   Potassium Chloride CR (MICRO-K) 8 MEQ CPCR capsule CR Take 1 capsule (8 mEq total) by mouth daily.   Roflumilast (DALIRESP) 250 MCG TABS Take 1 tablet by mouth daily.   simvastatin (ZOCOR) 20 MG tablet TAKE 1 TABLET(20 MG) BY MOUTH EVERY EVENING FOR CHOLESTEROL   sucralfate (CARAFATE) 1 g tablet TAKE 1 TABLET BY MOUTH FOUR TIMES DAILY AT BEDTIME WITH MEALS   tiotropium (SPIRIVA HANDIHALER) 18 MCG inhalation capsule INHALE CONTENTS OF 1 CAPSULE ONCE DAILY USING HANDIHALER   vitamin B-12 (CYANOCOBALAMIN) 1000 MCG tablet Take 1,000 mcg by mouth daily.   [DISCONTINUED] ALPRAZolam (XANAX) 0.25 MG tablet Take 1 tablet (0.25 mg total) by mouth 3 (three) times daily as needed for anxiety.   ALPRAZolam (XANAX) 0.25 MG tablet Take 1 tablet (0.25 mg total) by mouth 3 (three) times daily as needed for anxiety.   [DISCONTINUED] ibuprofen (ADVIL,MOTRIN) 800 MG tablet Take 800 mg by mouth every 8 (eight) hours as needed for mild pain.  (Patient not taking: Reported on 05/16/2021)   No facility-administered encounter medications on file as of 05/16/2021.    Surgical History: Past Surgical History:  Procedure Laterality Date   ESOPHAGOGASTRODUODENOSCOPY N/A 07/29/2018   Procedure: ESOPHAGOGASTRODUODENOSCOPY (EGD);  Surgeon: Toledo, Benay Pike, MD;  Location: [facility] ENDOSCOPY;  Service: Gastroenterology;  Laterality: N/A;   KIDNEY STONE SURGERY     TONSILLECTOMY      Medical History: Past Medical History:  Diagnosis Date   COPD (chronic obstructive pulmonary disease) (HCC)    Endometriosis    GERD (gastroesophageal reflux disease)    Hyperlipidemia    Kidney stones     Family History: Family History  Problem Relation Age of Onset   Hypertension Mother    Hyperlipidemia Mother    GER disease Mother    Heart attack Father        64 yrs ago   Hypertension  Father    Parkinson's disease Father    GER disease Father     Social History   Socioeconomic History   Marital status: Married    Spouse name: Not on file   Number of children: Not on file   Years of education: Not on file   Highest education level: Not on file  Occupational History   Not on file  Tobacco Use   Smoking status: Every Day    Packs/day: 1.00    Pack years: 0.00    Types: Cigarettes    Start date: 11/09/2017   Smokeless tobacco: Never   Tobacco comments:    1 pack a day   Vaping Use   Vaping Use: Former  Substance and Sexual Activity   Alcohol use: Yes    Comment: ocassionally    Drug use: No   Sexual activity: Not on file  Other Topics Concern   Not on file  Social History Narrative   Not on file   Social Determinants of Health   Financial Resource Strain: Not on file  Food Insecurity: Not on file  Transportation Needs: Not on file  Physical Activity: Not on file  Stress: Not on file  Social Connections: Not on file  Intimate Partner Violence: Not on file      Review of Systems  Constitutional:  Negative for chills, fatigue and unexpected weight change.  HENT:  Negative for congestion, rhinorrhea, sneezing and sore throat.   Eyes:  Negative for redness.  Respiratory:  Negative for cough, chest tightness and shortness of breath.   Cardiovascular:  Negative for chest pain and palpitations.  Gastrointestinal:  Negative for abdominal pain, constipation, diarrhea, nausea and vomiting.  Genitourinary:  Negative for dysuria and frequency.  Musculoskeletal:  Negative for arthralgias, back pain, joint swelling and neck pain.  Skin:  Negative for rash.  Neurological: Negative.  Negative for tremors and numbness.  Hematological:  Negative for adenopathy. Does not bruise/bleed easily.  Psychiatric/Behavioral:  Negative for behavioral problems (Depression), sleep disturbance and suicidal ideas. The patient is not nervous/anxious.    Vital Signs: BP  120/65   Pulse 86   Temp 98.6 F (37 C)   Resp 16   Ht 5\' 4"  (1.626 m)   Wt 127 lb 6.4 oz (57.8 kg)   SpO2 90%   BMI 21.87 kg/m    Physical Exam Vitals reviewed.  Constitutional:      Appearance: Normal appearance. She is normal weight.  HENT:     Head: Normocephalic and atraumatic.  Cardiovascular:     Rate and Rhythm: Normal rate and regular rhythm.     Pulses: Normal pulses.     Heart sounds: Normal heart sounds.  Pulmonary:     Effort: Pulmonary effort is normal.     Breath sounds: Normal breath sounds.  Skin:    General: Skin is warm and dry.     Capillary Refill: Capillary refill takes less than 2 seconds.  Neurological:     Mental Status: She is alert and oriented to person, place, and time.  Psychiatric:        Mood and Affect: Mood and affect normal.        Behavior: Behavior normal. Behavior is cooperative.    Assessment/Plan: 1. Chronic obstructive pulmonary disease, unspecified COPD type ([place]) Stable with current medications, no refills needed at this time.   2. Generalized anxiety disorder Stable, alprazolam refill ordered. - ALPRAZolam (XANAX) 0.25 MG tablet; Take 1 tablet (0.25 mg total) by mouth 3 (three) times daily as needed for anxiety.  Dispense: 90 tablet; Refill: 2  3. Age-related osteoporosis without current pathological fracture Taking alendronate, no refills needed at this time.   4. GERD without esophagitis Stable with medications, no refill needed at this time.    General Counseling: valaria kohut understanding of the findings of todays visit and agrees with plan of treatment. I have discussed any further diagnostic evaluation that may be needed or ordered today. We also reviewed her medications today. she has been encouraged to call the office with any questions or concerns that should arise related to todays visit.    No orders of the defined types were placed in this encounter.   Meds ordered this encounter  Medications    ALPRAZolam (XANAX) 0.25 MG tablet    Sig: Take 1 tablet (0.25 mg total) by mouth 3 (three) times daily as needed for anxiety.    Dispense:  90 tablet    Refill:  2    Return in about 4 months (around 09/15/2021) for CPE, [place] PCP.   Total time spent:30 Minutes Time spent includes review of chart, medications, test results, and follow up plan with the patient.   [place] Controlled Substance Database was reviewed by me.  This patient was seen by Jonetta Osgood, FNP-C in collaboration with Dr. Clayborn Bigness as a part of collaborative care agreement.   Elbia Paro R. Valetta Fuller, MSN, FNP-C Internal medicine

## 2021-06-05 ENCOUNTER — Ambulatory Visit: Payer: Medicare Other | Admitting: Internal Medicine

## 2021-06-08 ENCOUNTER — Other Ambulatory Visit: Payer: Self-pay

## 2021-06-08 ENCOUNTER — Ambulatory Visit: Payer: Medicare Other

## 2021-06-08 DIAGNOSIS — J449 Chronic obstructive pulmonary disease, unspecified: Secondary | ICD-10-CM

## 2021-06-08 MED ORDER — MONTELUKAST SODIUM 10 MG PO TABS: 10.0000 mg | ORAL_TABLET | Freq: Every day | ORAL | 5 refills | Status: DC

## 2021-06-11 ENCOUNTER — Other Ambulatory Visit: Payer: Self-pay

## 2021-06-11 ENCOUNTER — Other Ambulatory Visit: Payer: Self-pay | Admitting: Internal Medicine

## 2021-07-03 ENCOUNTER — Ambulatory Visit: Payer: Medicare Other | Admitting: Internal Medicine

## 2021-07-05 ENCOUNTER — Other Ambulatory Visit: Payer: Self-pay | Admitting: Internal Medicine

## 2021-07-05 DIAGNOSIS — J449 Chronic obstructive pulmonary disease, unspecified: Secondary | ICD-10-CM

## 2021-07-06 ENCOUNTER — Other Ambulatory Visit: Payer: Self-pay | Admitting: Internal Medicine

## 2021-07-06 DIAGNOSIS — K219 Gastro-esophageal reflux disease without esophagitis: Secondary | ICD-10-CM

## 2021-07-09 ENCOUNTER — Other Ambulatory Visit: Payer: Self-pay

## 2021-07-09 MED ORDER — FUROSEMIDE 40 MG PO TABS: ORAL_TABLET | ORAL | 1 refills | Status: DC

## 2021-07-17 ENCOUNTER — Ambulatory Visit: Payer: Medicare Other | Admitting: Internal Medicine

## 2021-07-24 ENCOUNTER — Other Ambulatory Visit: Payer: Self-pay | Admitting: Internal Medicine

## 2021-07-24 DIAGNOSIS — J449 Chronic obstructive pulmonary disease, unspecified: Secondary | ICD-10-CM

## 2021-08-10 ENCOUNTER — Other Ambulatory Visit: Payer: Self-pay | Admitting: Internal Medicine

## 2021-08-13 ENCOUNTER — Other Ambulatory Visit: Payer: Self-pay

## 2021-08-13 ENCOUNTER — Other Ambulatory Visit: Payer: Self-pay | Admitting: Internal Medicine

## 2021-08-13 DIAGNOSIS — J449 Chronic obstructive pulmonary disease, unspecified: Secondary | ICD-10-CM

## 2021-08-13 MED ORDER — DULERA 200-5 MCG/ACT IN AERO: 1.0000 | INHALATION_SPRAY | Freq: Two times a day (BID) | RESPIRATORY_TRACT | 0 refills | Status: DC

## 2021-08-15 ENCOUNTER — Ambulatory Visit: Payer: Medicare Other

## 2021-08-15 NOTE — Progress Notes (Deleted)
Chronic Care Management Pharmacy Note  08/15/2021 Name:  Rachael Blankenship MRN:  [id number] DOB:  August 27, 1956  Summary: ***  Recommendations/Changes made from today's visit: ***  Plan: ***   Subjective: Rachael Blankenship is an 65 y.o. year old female who is a primary patient of Rachael Rolls Timoteo Gaul, MD.  The CCM team was consulted for assistance with disease management and care coordination needs.    {CCMTELEPHONEFACETOFACE:[id number]} for initial visit in response to provider referral for pharmacy case management and/or care coordination services.   Consent to Services:  The patient was given the following information about Chronic Care Management services today, agreed to services, and gave verbal consent: 1. CCM service includes personalized support from designated clinical staff supervised by the primary care provider, including individualized plan of care and coordination with other care providers 2. 24/7 contact phone numbers for assistance for urgent and routine care needs. 3. Service will only be billed when office clinical staff spend 20 minutes or more in a month to coordinate care. 4. Only one practitioner may furnish and bill the service in a calendar month. 5.The patient may stop CCM services at any time (effective at the end of the month) by phone call to the office staff. 6. The patient will be responsible for cost sharing (co-pay) of up to 20% of the service fee (after annual deductible is met). Patient agreed to services and consent obtained.  Patient Care Team: Lavera Guise, MD as PCP - General (Internal Medicine) Edythe Clarity, [facility] as Pharmacist (Pharmacist)  Recent office visits:  02/09/21 Mylinda Latina, PA-C. No medication changes.  12/05/20 Dr. Humphrey Rolls For follow-up. No medication changes.    Recent consult visits:  None in the last 6 months   Hospital visits:  None in previous 6 months   Objective:  Lab Results  Component Value Date   CREATININE 0.64  09/05/2020   BUN 12 09/05/2020   GFRNONAA 93 09/05/2020   GFRAA 108 09/05/2020   NA 141 09/05/2020   K 4.4 09/05/2020   CALCIUM 9.3 09/05/2020   CO2 30 (H) 09/05/2020   GLUCOSE 100 (H) 09/05/2020    Lab Results  Component Value Date/Time   HGBA1C 6.2 (H) 05/22/2018 12:02 AM    Last diabetic Eye exam: No results found for: HMDIABEYEEXA  Last diabetic Foot exam: No results found for: HMDIABFOOTEX   Lab Results  Component Value Date   CHOL 177 09/05/2020   HDL 61 09/05/2020   LDLCALC 102 (H) 09/05/2020   TRIG 77 09/05/2020    Hepatic Function Latest Ref Rng & Units 09/05/2020 03/29/2019 05/21/2018  Total Protein 6.0 - 8.5 g/dL 6.3 6.4 7.1  Albumin 3.8 - 4.8 g/dL 4.3 4.4 3.6  AST 0 - 40 IU/L _0 ALT 0 - 32 IU/L _1 Alk Phosphatase 44 - 121 IU/L 102 101 86  Total Bilirubin 0.0 - 1.2 mg/dL 0.3 0.5 1.2    Lab Results  Component Value Date/Time   TSH 0.911 09/05/2020 10:32 AM   TSH 0.992 03/29/2019 03:06 PM   FREET4 1.25 09/05/2020 10:32 AM   FREET4 1.17 03/29/2019 03:06 PM    CBC Latest Ref Rng & Units 09/05/2020 03/29/2019 05/21/2018  WBC 3.4 - 10.8 x10E3/uL 6.9 9.1 19.1(H)  Hemoglobin 11.1 - 15.9 g/dL 14.6 14.9 15.3  Hematocrit 34.0 - 46.6 % 44.5 43.1 45.2  Platelets 150 - 450 x10E3/uL 283 328 232    Lab Results  Component Value Date/Time  VD25OH 39.8 09/05/2020 10:32 AM   VD25OH 55.4 03/29/2019 03:06 PM    Clinical ASCVD: {YES/NO:21197} The 10-year ASCVD risk score (Arnett DK, et al., 2019) is: 9.7%   Values used to calculate the score:     Age: 19 years     Sex: Female     Is Non-Hispanic African American: No     Diabetic: No     Tobacco smoker: Yes     Systolic Blood Pressure: 347 mmHg     Is BP treated: No     HDL Cholesterol: 61 mg/dL     Total Cholesterol: 177 mg/dL    Depression screen [facility] 2/9 05/16/2021 02/09/2021 10/02/2020  Decreased Interest 1 0 0  Down, Depressed, Hopeless 1 0 0  PHQ - 2 Score 2 0 0  Altered sleeping 1 - -  Tired,  decreased energy 1 - -  Change in appetite 1 - -  Feeling bad or failure about yourself  1 - -  Trouble concentrating 1 - -  Moving slowly or fidgety/restless 0 - -  Suicidal thoughts 0 - -  PHQ-9 Score 7 - -     ***Other: (CHADS2VASc if Afib, MMRC or CAT for COPD, ACT, DEXA)  Social History   Tobacco Use  Smoking Status Every Day   Packs/day: 1.00   Types: Cigarettes   Start date: 11/09/2017  Smokeless Tobacco Never  Tobacco Comments   1 pack a day    BP Readings from Last 3 Encounters:  05/16/21 120/65  02/09/21 122/64  12/05/20 109/72   Pulse Readings from Last 3 Encounters:  05/16/21 86  02/09/21 70  12/05/20 68   Wt Readings from Last 3 Encounters:  05/16/21 127 lb 6.4 oz (57.8 kg)  02/09/21 125 lb 9.6 oz (57 kg)  12/05/20 127 lb 9.6 oz (57.9 kg)   BMI Readings from Last 3 Encounters:  05/16/21 21.87 kg/m  02/09/21 21.56 kg/m  12/05/20 21.90 kg/m    Assessment/Interventions: Review of patient past medical history, allergies, medications, health status, including review of consultants reports, laboratory and other test data, was performed as part of comprehensive evaluation and provision of chronic care management services.   SDOH:  (Social Determinants of Health) assessments and interventions performed: {yes/no:20286}  SDOH Screenings   Alcohol Screen: Low Risk    Last Alcohol Screening Score (AUDIT): 1  Depression (PHQ2-9): Medium Risk   PHQ-2 Score: 7  Financial Resource Strain: Not on file  Food Insecurity: Not on file  Housing: Not on file  Physical Activity: Not on file  Social Connections: Not on file  Stress: Not on file  Tobacco Use: High Risk   Smoking Tobacco Use: Every Day   Smokeless Tobacco Use: Never  Transportation Needs: Not on file    CCM Care Plan  Allergies  Allergen Reactions   Naproxen Itching   Nsaids Other (See Comments)    Other reaction(s): Unknown   Sulfa Antibiotics Hives and Rash    Medications Reviewed  Today     Reviewed by Tora Perches on 05/16/21 at 1115  Med List Status: <None>   Medication Order Taking? Sig Documenting Provider Last Dose Status Informant  alendronate (FOSAMAX) 70 MG tablet [id number]  Take 70 mg by mouth once a week. Take with a full glass of water on an empty stomach. [provider]  Active   ALPRAZolam Duanne Moron) 0.25 MG tablet [id number]  Take 1 tablet (0.25 mg total) by mouth 3 (three) times daily as needed for anxiety.  Ronnell Freshwater, NP  Active   ascorbic acid (VITAMIN C) 500 MG tablet [id number]  Take 500 mg by mouth daily. [provider]  Active Self  aspirin 81 MG tablet [id number]  Take 81 mg by mouth at bedtime.  [provider]  Active Self  Cholecalciferol 25 MCG (1000 UT) tablet [id number]  Take 1,000 Units by mouth daily.  [provider]  Active Self  Coenzyme Q10 (CO Q-10) 100 MG CAPS [id number]  Take 200 mg by mouth daily.  [provider]  Active Self  COMBIVENT RESPIMAT 20-100 MCG/ACT AERS respimat [id number]  INHALE 1 PUFF INTO THE LUNGS EVERY 6 HOURS AS NEEDED FOR WHEEZING OR SHORTNESS OF BREATH [place], Audie Clear, NP  Active   escitalopram (LEXAPRO) 20 MG tablet [id number]  Take 1 tablet (20 mg total) by mouth daily. Ronnell Freshwater, NP  Active   furosemide (LASIX) 40 MG tablet [id number]  TAKE 1 TABLET( 40 MG TOTAL) BY MOUTH DAILY Lavera Guise, MD  Active   ibuprofen (ADVIL,MOTRIN) 800 MG tablet [id number]  Take 800 mg by mouth every 8 (eight) hours as needed for mild pain.  [provider]  Active Self  ipratropium-albuterol (DUONEB) 0.5-2.5 (3) MG/3ML SOLN [id number]  Use 1 vial via nebulizer every 4 t0 6 hrs Ronnell Freshwater, NP  Active   Magnesium 400 MG TABS [id number]  Take 400 mg by mouth daily.  [provider]  Active Self  mometasone (NASONEX) 50 MCG/ACT nasal spray [id number]  Place 2 sprays into the nose daily. [provider]  Active   mometasone-formoterol (DULERA) 200-5  MCG/ACT AERO [id number]  Inhale 1 puff into the lungs 2 (two) times daily. Lavera Guise, MD  Active   montelukast (SINGULAIR) 10 MG tablet [id number]  Take 1 tablet (10 mg total) by mouth daily. Allyne Gee, MD  Active   omeprazole (PRILOSEC) 40 MG capsule [id number]  Take 1 capsule (40 mg total) by mouth 2 (two) times daily. Lavera Guise, MD  Active   OXYGEN [id number]  Inhale into the lungs. Oxygen at night, 2 liters [provider]  Active   pneumococcal 23 valent vaccine (PNEUMOVAX 23) 25 MCG/0.5ML injection [id number]  Inject 0.32m IM once BRonnell Freshwater NP  Active   Potassium Chloride CR (MICRO-K) 8 MEQ CPCR capsule CR [id number] Take 1 capsule (8 mEq total) by mouth daily. KLavera Guise MD  Active   Roflumilast (DALIRESP) 250 MCG TABS [id number] Take 1 tablet by mouth daily. KLavera Guise MD  Active   simvastatin (ZOCOR) 20 MG tablet [id number] TAKE 1 TABLET(20 MG) BY MOUTH EVERY EVENING FOR CHOLESTEROL KLavera Guise MD  Active   sucralfate (CARAFATE) 1 g tablet [id number] TAKE 1 TABLET BY MOUTH FOUR TIMES DAILY AT BEDTIME WITH MEALS KLavera Guise MD  Active   tiotropium ([facility]) 18 MCG inhalation capsule [id number] INHALE CONTENTS OF 1 CAPSULE ONCE DAILY USING HPayton Mccallum MD  Active   vitamin B-12 (CYANOCOBALAMIN) 1000 MCG tablet [id number] Take 1,000 mcg by mouth daily. [provider]  Active Self            Patient Active Problem List   Diagnosis Date Noted   History of COVID-19 12/11/2020   Encounter for general adult medical examination with abnormal findings 09/17/2020   Suprapubic abdominal pain 09/17/2020   Encounter for screening mammogram for malignant neoplasm of breast 09/17/2020   Chronic obstructive  pulmonary disease ([place]) 05/12/2020   Age-related osteoporosis without current pathological fracture 04/05/2020   Need for vaccination against Streptococcus pneumoniae using pneumococcal conjugate vaccine 13 03/29/2019    Cigarette smoker 11/26/2018   Obstructive chronic bronchitis without exacerbation ([place]) 08/25/2018   Gastroesophageal reflux disease without esophagitis 08/25/2018   Black stool 08/25/2018   Needs flu shot 08/25/2018   Acute respiratory failure with hypoxemia ([place]) 05/21/2018   Screening for osteoporosis 05/13/2018   Acute upper respiratory infection 05/13/2018   Other fatigue 05/13/2018   Generalized anxiety disorder 05/13/2018   Vitamin D deficiency 05/13/2018   Screening for malignant neoplasm of cervix 05/13/2018   Dysuria 05/13/2018   Chest pain 01/18/2015   Shortness of breath 01/18/2015   Hyperlipemia 03/08/2014   Pulmonary HTN ([place]) 03/08/2014   Reactive airway disease 03/08/2014    Immunization History  Administered Date(s) Administered   Influenza Inj Mdck Quad Pf 08/24/2018, 08/29/2020   Influenza,inj,Quad PF,6+ Mos 09/21/2017   Pneumococcal Polysaccharide-23 08/28/2020    Conditions to be addressed/monitored:  COPD, GERD, GAD, HLD, Osteoporosos,   There are no care plans that you recently modified to display for this patient.    Medication Assistance: {MEDASSISTANCEINFO:25044}  Compliance/Adherence/Medication fill history: Care Gaps: ***  Star-Rating Drugs: ***  Patient's preferred pharmacy is:  [facility], [address] [place] [address] Phone: [phone] Fax: [place], [place] [address] [place] [address] Phone: [phone] Fax: [id number] [address], [address] [place] [place] [address] Phone: [phone] Fax: [phone]  Uses pill box? {Yes or If no, why not?:20788} Pt endorses ***% compliance  We discussed: {Pharmacy options:24294} Patient decided to: {US Pharmacy Plan:23885}  Care Plan and Follow Up Patient Decision:   {FOLLOWUP:24991}  Plan: {CM FOLLOW UP PIRJ:18841}  ***  Current Barriers:  {pharmacybarriers:24917}  Pharmacist Clinical Goal(s):  Patient will {PHARMACYGOALCHOICES:24921} through collaboration with PharmD and provider.   Interventions: 1:1 collaboration with Lavera Guise, MD regarding development and update of comprehensive plan of care as evidenced by provider attestation and co-signature Inter-disciplinary care team collaboration (see longitudinal plan of care) Comprehensive medication review performed; medication list updated in electronic medical record  Hyperlipidemia: (LDL goal < ***) -{US controlled/uncontrolled:25276} -Current treatment: Simvastatin 69m daily -Medications previously tried: ***  -Current dietary patterns: *** -Current exercise habits: *** -Educated on {CCM HLD Counseling:25126} -{CCMPHARMDINTERVENTION:25122}  COPD (Goal: control symptoms and prevent exacerbations) -{US controlled/uncontrolled:25276} -Current treatment  Spiriva 130m once daily -Medications previously tried: ***  -Gold Grade: {CHL HP Upstream Pharm COPD Gold GrYSAYT:[id number]}Current COPD Classification:  {CHL HP Upstream Pharm COPD Classification:[phone]} -MMRC/CAT score: *** -Pulmonary function testing: *** -Exacerbations requiring treatment in last 6 months: *** -Patient {Actions; denies-reports:120008} consistent use of maintenance inhaler -Frequency of rescue inhaler use: *** -Counseled on {CCMINHALERCOUNSELING:25121} -{CCMPHARMDINTERVENTION:25122}  Depression/Anxiety (Goal: ***) -{US controlled/uncontrolled:25276} -Current treatment: Escitalopram 2066maily -Medications previously tried/failed: *** -PHQ9: *** -GAD7: *** -Connected with *** for mental health support -Educated on {CCM mental health counseling:25127} -{CCMPHARMDINTERVENTION:25122}  GERD (Goal: ***) -{US controlled/uncontrolled:25276} -Current treatment  Carafate 1g Omeprazole 32m80maily -Medications previously tried: ***  -{CCMPHARMDINTERVENTION:25122}  Osteoporosis / Osteopenia (Goal ***) -{US controlled/uncontrolled:25276} -Last DEXA Scan: ***   T-Score femoral neck: ***  T-Score total hip: ***  T-Score lumbar spine: ***  T-Score forearm radius: ***  10-year probability of major osteoporotic fracture: ***  10-year probability of hip fracture: *** -Patient {is;is not an osteoporosis candidate:23886} -Current treatment  Alendronate 13m once weekly -Medications previously tried: ***  -{Osteoporosis Counseling:23892} -{CCMPHARMDINTERVENTION:25122}  Patient Goals/Self-Care Activities Patient will:  - {pharmacypatientgoals:24919}  Follow Up Plan: {CM FOLLOW UP PYPPJ:09326}

## 2021-08-21 ENCOUNTER — Other Ambulatory Visit: Payer: Self-pay | Admitting: Nurse Practitioner

## 2021-08-21 DIAGNOSIS — F411 Generalized anxiety disorder: Secondary | ICD-10-CM

## 2021-08-21 MED ORDER — ALPRAZOLAM 0.25 MG PO TABS: 0.2500 mg | ORAL_TABLET | Freq: Three times a day (TID) | ORAL | 0 refills | Status: DC | PRN

## 2021-08-30 ENCOUNTER — Other Ambulatory Visit: Payer: Self-pay

## 2021-08-30 ENCOUNTER — Encounter: Payer: Self-pay | Admitting: Internal Medicine

## 2021-08-30 ENCOUNTER — Ambulatory Visit (INDEPENDENT_AMBULATORY_CARE_PROVIDER_SITE_OTHER): Payer: Medicare Other | Admitting: Internal Medicine

## 2021-08-30 VITALS — BP 112/68 | HR 83 | Temp 98.1°F | Resp 16 | Ht 64.0 in | Wt 124.4 lb

## 2021-08-30 DIAGNOSIS — K219 Gastro-esophageal reflux disease without esophagitis: Secondary | ICD-10-CM | POA: Diagnosis not present

## 2021-08-30 DIAGNOSIS — R0602 Shortness of breath: Secondary | ICD-10-CM

## 2021-08-30 DIAGNOSIS — J449 Chronic obstructive pulmonary disease, unspecified: Secondary | ICD-10-CM

## 2021-08-30 DIAGNOSIS — Z720 Tobacco use: Secondary | ICD-10-CM

## 2021-08-30 DIAGNOSIS — F411 Generalized anxiety disorder: Secondary | ICD-10-CM

## 2021-08-30 MED ORDER — COMBIVENT RESPIMAT 20-100 MCG/ACT IN AERS: INHALATION_SPRAY | RESPIRATORY_TRACT | 3 refills | Status: DC

## 2021-08-30 MED ORDER — ESCITALOPRAM OXALATE 20 MG PO TABS: 20.0000 mg | ORAL_TABLET | Freq: Every day | ORAL | 1 refills | Status: DC

## 2021-08-30 NOTE — Progress Notes (Signed)
[facility] Rachael Blankenship, [place] [address]  Pulmonary Sleep Medicine   Office Visit Note  Patient Name: Rachael Blankenship DOB: [birth year] MRN [id number]  Date of Service: 08/30/2021  Complaints/HPI: COPD she has had no exacerbations. Patient is at her baseline. As far as smoking she states she is still smoking has really no success in quitting. She states she is addicted to it. Her last PFT shows FEV1 of 0.8 which severely reduced. She does use her oxygen at night and sometimes durign the day too  ROS  General: (-) fever, (-) chills, (-) night sweats, (-) weakness Skin: (-) rashes, (-) itching,. Eyes: (-) visual changes, (-) redness, (-) itching. Nose and Sinuses: (-) nasal stuffiness or itchiness, (-) postnasal drip, (-) nosebleeds, (-) sinus trouble. Mouth and Throat: (-) sore throat, (-) hoarseness. Neck: (-) swollen glands, (-) enlarged thyroid, (-) neck pain. Respiratory: + cough, (-) bloody sputum, + shortness of breath, - wheezing. Cardiovascular: - ankle swelling, (-) chest pain. Lymphatic: (-) lymph node enlargement. Neurologic: (-) numbness, (-) tingling. Psychiatric: (-) anxiety, (-) depression   Current Medication: Outpatient Encounter Medications as of 08/30/2021  Medication Sig   alendronate (FOSAMAX) 70 MG tablet Take 70 mg by mouth once a week. Take with a full glass of water on an empty stomach.   ALPRAZolam (XANAX) 0.25 MG tablet Take 1 tablet (0.25 mg total) by mouth 3 (three) times daily as needed for anxiety.   ascorbic acid (VITAMIN C) 500 MG tablet Take 500 mg by mouth daily.   aspirin 81 MG tablet Take 81 mg by mouth at bedtime.    Cholecalciferol 25 MCG (1000 UT) tablet Take 1,000 Units by mouth daily.    Coenzyme Q10 (CO Q-10) 100 MG CAPS Take 200 mg by mouth daily.    COMBIVENT RESPIMAT 20-100 MCG/ACT AERS respimat INHALE 1 PUFF INTO THE LUNGS EVERY 6 HOURS AS NEEDED FOR WHEEZING OR SHORTNESS OF BREATH   DENTA 5000 PLUS 1.1 % CREA  dental cream Take 1 application by mouth daily.   escitalopram (LEXAPRO) 20 MG tablet Take 1 tablet (20 mg total) by mouth daily.   furosemide (LASIX) 40 MG tablet TAKE 1 TABLET( 40 MG TOTAL) BY MOUTH DAILY   ipratropium-albuterol (DUONEB) 0.5-2.5 (3) MG/3ML SOLN Use 1 vial via nebulizer every 4 t0 6 hrs   Magnesium 400 MG TABS Take 400 mg by mouth daily.    mometasone (NASONEX) 50 MCG/ACT nasal spray Place 2 sprays into the nose daily.   mometasone-formoterol (DULERA) 200-5 MCG/ACT AERO Inhale 1 puff into the lungs 2 (two) times daily.   montelukast (SINGULAIR) 10 MG tablet Take 1 tablet (10 mg total) by mouth daily.   omeprazole (PRILOSEC) 40 MG capsule Take 1 capsule (40 mg total) by mouth 2 (two) times daily.   OXYGEN Inhale into the lungs. Oxygen at night, 2 liters   pneumococcal 23 valent vaccine (PNEUMOVAX 23) 25 MCG/0.5ML injection Inject 0.110ml IM once   Potassium Chloride CR (MICRO-K) 8 MEQ CPCR capsule CR Take 1 capsule (8 mEq total) by mouth daily.   Roflumilast (DALIRESP) 250 MCG TABS Take 1 tablet by mouth daily.   simvastatin (ZOCOR) 20 MG tablet TAKE 1 TABLET(20 MG) BY MOUTH EVERY EVENING FOR CHOLESTEROL   sucralfate (CARAFATE) 1 g tablet TAKE 1 TABLET BY MOUTH FOUR TIMES DAILY AT BEDTIME WITH MEALS   tiotropium (SPIRIVA HANDIHALER) 18 MCG inhalation capsule INHALE CONTENTS OF 1 CAPSULE ONCE DAILY USING HANDIHALER   vitamin B-12 (CYANOCOBALAMIN)  1000 MCG tablet Take 1,000 mcg by mouth daily.   vitamin E 180 MG (400 UNITS) capsule Take by mouth.   No facility-administered encounter medications on file as of 08/30/2021.    Surgical History: Past Surgical History:  Procedure Laterality Date   ESOPHAGOGASTRODUODENOSCOPY N/A 07/29/2018   Procedure: ESOPHAGOGASTRODUODENOSCOPY (EGD);  Surgeon: Toledo, Benay Pike, MD;  Location: [facility] ENDOSCOPY;  Service: Gastroenterology;  Laterality: N/A;   KIDNEY STONE SURGERY     TONSILLECTOMY      Medical History: Past Medical History:   Diagnosis Date   COPD (chronic obstructive pulmonary disease) (HCC)    Endometriosis    GERD (gastroesophageal reflux disease)    Hyperlipidemia    Kidney stones     Family History: Family History  Problem Relation Age of Onset   Hypertension Mother    Hyperlipidemia Mother    GER disease Mother    Heart attack Father        15 yrs ago   Hypertension Father    Parkinson's disease Father    GER disease Father     Social History: Social History   Socioeconomic History   Marital status: Married    Spouse name: Not on file   Number of children: Not on file   Years of education: Not on file   Highest education level: Not on file  Occupational History   Not on file  Tobacco Use   Smoking status: Every Day    Packs/day: 1.00    Types: Cigarettes    Start date: 11/09/2017   Smokeless tobacco: Never   Tobacco comments:    1 pack a day   Vaping Use   Vaping Use: Former  Substance and Sexual Activity   Alcohol use: Yes    Comment: ocassionally    Drug use: No   Sexual activity: Not on file  Other Topics Concern   Not on file  Social History Narrative   Not on file   Social Determinants of Health   Financial Resource Strain: Not on file  Food Insecurity: Not on file  Transportation Needs: Not on file  Physical Activity: Not on file  Stress: Not on file  Social Connections: Not on file  Intimate Partner Violence: Not on file    Vital Signs: Blood pressure 112/68, pulse 83, temperature 98.1 F (36.7 C), resp. rate 16, height 5\' 4"  (1.626 m), weight 124 lb 6.4 oz (56.4 kg), SpO2 99 %.  Examination: General Appearance: The patient is well-developed, well-nourished, and in no distress. Skin: Gross inspection of skin unremarkable. Head: normocephalic, no gross deformities. Eyes: no gross deformities noted. ENT: ears appear grossly normal no exudates. Neck: Supple. No thyromegaly. No LAD. Respiratory: few rhonchi noted at this time. Cardiovascular: Normal S1  and S2 without murmur or rub. Extremities: No cyanosis. pulses are equal. Neurologic: Alert and oriented. No involuntary movements.  LABS: No results found for this or any previous visit (from the past 2160 hour(s)).  Radiology: NM Hepato W/EjeCT Fract  Result Date: 08/19/2018 CLINICAL DATA:  Epigastric and upper abdominal pain EXAM: NUCLEAR MEDICINE HEPATOBILIARY IMAGING WITH GALLBLADDER EF VIEWS: Anterior, right lateral right upper quadrant RADIOPHARMACEUTICALS:  5.693 mCi Tc-61m  Choletec IV COMPARISON:  None. FINDINGS: Liver uptake of radiotracer is unremarkable. There is prompt visualization of gallbladder and small bowel, indicating patency of the cystic and common bile ducts. The patient consumed 8 ounces of Ensure orally with calculation of the computer generated ejection fraction of radiotracer from the gallbladder. The patient did  not report clinical symptoms with the oral Ensure consumption. The computer generated ejection fraction of radiotracer from the gallbladder is normal at 54%, normal greater than 33% using the oral agent. IMPRESSION: Study within normal limits. Electronically Signed   By: Lowella Grip III M.D.   On: 08/19/2018 13:59    No results found.  No results found.    Assessment and Plan: Patient Active Problem List   Diagnosis Date Noted   History of COVID-19 12/11/2020   Encounter for general adult medical examination with abnormal findings 09/17/2020   Suprapubic abdominal pain 09/17/2020   Encounter for screening mammogram for malignant neoplasm of breast 09/17/2020   Chronic obstructive pulmonary disease ([place]) 05/12/2020   Age-related osteoporosis without current pathological fracture 04/05/2020   Need for vaccination against Streptococcus pneumoniae using pneumococcal conjugate vaccine 13 03/29/2019   Cigarette smoker 11/26/2018   Obstructive chronic bronchitis without exacerbation ([place]) 08/25/2018   Gastroesophageal reflux disease without  esophagitis 08/25/2018   Black stool 08/25/2018   Needs flu shot 08/25/2018   Acute respiratory failure with hypoxemia ([place]) 05/21/2018   Screening for osteoporosis 05/13/2018   Acute upper respiratory infection 05/13/2018   Other fatigue 05/13/2018   Generalized anxiety disorder 05/13/2018   Vitamin D deficiency 05/13/2018   Screening for malignant neoplasm of cervix 05/13/2018   Dysuria 05/13/2018   Chest pain 01/18/2015   Shortness of breath 01/18/2015   Hyperlipemia 03/08/2014   Pulmonary HTN ([place]) 03/08/2014   Reactive airway disease 03/08/2014   1. SOB (shortness of breath) Severe COPD and ongoing cigarette use once again urged to quit smoking in the strongest possible way - Pulmonary function test; Future  2. Chronic obstructive pulmonary disease, unspecified COPD type ([place]) Severe disease continue with inhalers  3. GERD without esophagitis Over-the-counter PPI as needed we will continue with supportive care patient has been on omeprazole  4. Tobacco abuse Smoking cessation discussed in length   General Counseling: I have discussed the findings of the evaluation and examination with Rachael Blankenship.  I have also discussed any further diagnostic evaluation thatmay be needed or ordered today. Rachael Blankenship verbalizes understanding of the findings of todays visit. We also reviewed her medications today and discussed drug interactions and side effects including but not limited excessive drowsiness and altered mental states. We also discussed that there is always a risk not just to her but also people around her. she has been encouraged to call the office with any questions or concerns that should arise related to todays visit.  Orders Placed This Encounter  Procedures   Pulmonary function test    Standing Status:   Future    Standing Expiration Date:   08/30/2022    Order Specific Question:   Where should this test be performed?    Answer:   [facility]     Time spent:  18  I have personally obtained a history, examined the patient, evaluated laboratory and imaging results, formulated the assessment and plan and placed orders.    Allyne Gee, MD [facility] Pulmonary and Critical Care Sleep medicine

## 2021-08-30 NOTE — Patient Instructions (Signed)
Steps to Quit Smoking Smoking tobacco is the leading cause of preventable death. It can affect almost every organ in the body. Smoking puts you and people around you at risk for many serious, long-lasting (chronic) diseases. Quitting smoking can be hard, but it is one of the best things that you can do for your health. It is never too late to quit. How do I get ready to quit? When you decide to quit smoking, make a plan to help you succeed. Before you quit: Pick a date to quit. Set a date within the next 2 weeks to give you time to prepare. Write down the reasons why you are quitting. Keep this list in places where you will see it often. Tell your family, friends, and co-workers that you are quitting. Their support is important. Talk with your doctor about the choices that may help you quit. Find out if your health insurance will pay for these treatments. Know the people, places, things, and activities that make you want to smoke (triggers). Avoid them. What first steps can I take to quit smoking? Throw away all cigarettes at home, at work, and in your car. Throw away the things that you use when you smoke, such as ashtrays and lighters. Clean your car. Make sure to empty the ashtray. Clean your home, including curtains and carpets. What can I do to help me quit smoking? Talk with your doctor about taking medicines and seeing a counselor at the same time. You are more likely to succeed when you do both. If you are pregnant or breastfeeding, talk with your doctor about counseling or other ways to quit smoking. Do not take medicine to help you quit smoking unless your doctor tells you to do so. To quit smoking: Quit right away Quit smoking totally, instead of slowly cutting back on how much you smoke over a period of time. Go to counseling. You are more likely to quit if you go to counseling sessions regularly. Take medicine You may take medicines to help you quit. Some medicines need a  prescription, and some you can buy over-the-counter. Some medicines may contain a drug called nicotine to replace the nicotine in cigarettes. Medicines may: Help you to stop having the desire to smoke (cravings). Help to stop the problems that come when you stop smoking (withdrawal symptoms). Your doctor may ask you to use: Nicotine patches, gum, or lozenges. Nicotine inhalers or sprays. Non-nicotine medicine that is taken by mouth. Find resources Find resources and other ways to help you quit smoking and remain smoke-free after you quit. These resources are most helpful when you use them often. They include: Online chats with a counselor. Phone quitlines. Printed self-help materials. Support groups or group counseling. Text messaging programs. Mobile phone apps. Use apps on your mobile phone or tablet that can help you stick to your quit plan. There are many free apps for mobile phones and tablets as well as websites. Examples include Quit Guide from the CDC and smokefree.gov  What things can I do to make it easier to quit?  Talk to your family and friends. Ask them to support and encourage you. Call a phone quitline (1-800-QUIT-NOW), reach out to support groups, or work with a counselor. Ask people who smoke to not smoke around you. Avoid places that make you want to smoke, such as: Bars. Parties. Smoke-break areas at work. Spend time with people who do not smoke. Lower the stress in your life. Stress can make you want to   smoke. Try these things to help your stress: Getting regular exercise. Doing deep-breathing exercises. Doing yoga. Meditating. Doing a body scan. To do this, close your eyes, focus on one area of your body at a time from head to toe. Notice which parts of your body are tense. Try to relax the muscles in those areas. How will I feel when I quit smoking? Day 1 to 3 weeks Within the first 24 hours, you may start to have some problems that come from quitting tobacco.  These problems are very bad 2-3 days after you quit, but they do not often last for more than 2-3 weeks. You may get these symptoms: Mood swings. Feeling restless, nervous, angry, or annoyed. Trouble concentrating. Dizziness. Strong desire for high-sugar foods and nicotine. Weight gain. Trouble pooping (constipation). Feeling like you may vomit (nausea). Coughing or a sore throat. Changes in how the medicines that you take for other issues work in your body. Depression. Trouble sleeping (insomnia). Week 3 and afterward After the first 2-3 weeks of quitting, you may start to notice more positive results, such as: Better sense of smell and taste. Less coughing and sore throat. Slower heart rate. Lower blood pressure. Clearer skin. Better breathing. Fewer sick days. Quitting smoking can be hard. Do not give up if you fail the first time. Some people need to try a few times before they succeed. Do your best to stick to your quit plan, and talk with your doctor if you have any questions or concerns. Summary Smoking tobacco is the leading cause of preventable death. Quitting smoking can be hard, but it is one of the best things that you can do for your health. When you decide to quit smoking, make a plan to help you succeed. Quit smoking right away, not slowly over a period of time. When you start quitting, seek help from your doctor, family, or friends. This information is not intended to replace advice given to you by your health care provider. Make sure you discuss any questions you have with your health care provider. Document Revised: 08/06/2019 Document Reviewed: 01/30/2019 Elsevier Patient Education  2022 Elsevier Inc.  

## 2021-09-13 ENCOUNTER — Encounter: Payer: Self-pay | Admitting: Nurse Practitioner

## 2021-09-13 ENCOUNTER — Ambulatory Visit (INDEPENDENT_AMBULATORY_CARE_PROVIDER_SITE_OTHER): Payer: Medicare Other | Admitting: Nurse Practitioner

## 2021-09-13 ENCOUNTER — Other Ambulatory Visit: Payer: Self-pay

## 2021-09-13 VITALS — BP 110/64 | HR 82 | Temp 97.8°F | Ht 64.0 in | Wt 125.4 lb

## 2021-09-13 DIAGNOSIS — Z720 Tobacco use: Secondary | ICD-10-CM

## 2021-09-13 DIAGNOSIS — F411 Generalized anxiety disorder: Secondary | ICD-10-CM

## 2021-09-13 DIAGNOSIS — D513 Other dietary vitamin B12 deficiency anemia: Secondary | ICD-10-CM | POA: Diagnosis not present

## 2021-09-13 DIAGNOSIS — Z1231 Encounter for screening mammogram for malignant neoplasm of breast: Secondary | ICD-10-CM | POA: Diagnosis not present

## 2021-09-13 DIAGNOSIS — E559 Vitamin D deficiency, unspecified: Secondary | ICD-10-CM | POA: Diagnosis not present

## 2021-09-13 DIAGNOSIS — Z0001 Encounter for general adult medical examination with abnormal findings: Secondary | ICD-10-CM | POA: Diagnosis not present

## 2021-09-13 DIAGNOSIS — K219 Gastro-esophageal reflux disease without esophagitis: Secondary | ICD-10-CM

## 2021-09-13 DIAGNOSIS — J441 Chronic obstructive pulmonary disease with (acute) exacerbation: Secondary | ICD-10-CM

## 2021-09-13 DIAGNOSIS — E782 Mixed hyperlipidemia: Secondary | ICD-10-CM

## 2021-09-13 DIAGNOSIS — R300 Dysuria: Secondary | ICD-10-CM | POA: Diagnosis not present

## 2021-09-13 DIAGNOSIS — D649 Anemia, unspecified: Secondary | ICD-10-CM | POA: Diagnosis not present

## 2021-09-13 DIAGNOSIS — J4489 Other specified chronic obstructive pulmonary disease: Secondary | ICD-10-CM

## 2021-09-13 DIAGNOSIS — J449 Chronic obstructive pulmonary disease, unspecified: Secondary | ICD-10-CM

## 2021-09-13 DIAGNOSIS — M810 Age-related osteoporosis without current pathological fracture: Secondary | ICD-10-CM

## 2021-09-13 MED ORDER — DALIRESP 250 MCG PO TABS: 1.0000 | ORAL_TABLET | Freq: Every day | ORAL | 4 refills | Status: DC

## 2021-09-13 MED ORDER — IPRATROPIUM-ALBUTEROL 0.5-2.5 (3) MG/3ML IN SOLN: RESPIRATORY_TRACT | 3 refills | Status: DC

## 2021-09-13 MED ORDER — DULERA 200-5 MCG/ACT IN AERO: 1.0000 | INHALATION_SPRAY | Freq: Two times a day (BID) | RESPIRATORY_TRACT | 5 refills | Status: DC

## 2021-09-13 MED ORDER — ALPRAZOLAM 0.25 MG PO TABS: 0.2500 mg | ORAL_TABLET | Freq: Three times a day (TID) | ORAL | 2 refills | Status: DC | PRN

## 2021-09-13 MED ORDER — SPIRIVA HANDIHALER 18 MCG IN CAPS: ORAL_CAPSULE | RESPIRATORY_TRACT | 5 refills | Status: DC

## 2021-09-13 MED ORDER — SIMVASTATIN 20 MG PO TABS: ORAL_TABLET | ORAL | 1 refills | Status: DC

## 2021-09-13 NOTE — Progress Notes (Signed)
[facility] [place], [place] [address]  Internal MEDICINE  Office Visit Note  Patient Name: Rachael Blankenship  [id number]  [id number]  Date of Service: 09/13/2021  Chief Complaint  Patient presents with   Medicare Wellness   COPD    HPI Yulonda presents for an annual well visit and physical exam. Rakiyah has COPD, GERD, osteoporosis. She has been through a lot lately. Her father passes away a few months ago and her nephew passed away as well.  The GERD and COPD are stable at this time. She is requesting refills. She is due for lab draw. She is also due for routine mammogram. She denies any pain and she has no other concerns or questions today.   Current Medication: Outpatient Encounter Medications as of 09/13/2021  Medication Sig   ALPRAZolam (XANAX) 0.25 MG tablet Take 1 tablet (0.25 mg total) by mouth 3 (three) times daily as needed for anxiety.   ascorbic acid (VITAMIN C) 500 MG tablet Take 500 mg by mouth daily.   aspirin 81 MG tablet Take 81 mg by mouth at bedtime.    Cholecalciferol 25 MCG (1000 UT) tablet Take 1,000 Units by mouth daily.    Coenzyme Q10 (CO Q-10) 100 MG CAPS Take 200 mg by mouth daily.    DENTA 5000 PLUS 1.1 % CREA dental cream Take 1 application by mouth daily.   escitalopram (LEXAPRO) 20 MG tablet Take 1 tablet (20 mg total) by mouth daily.   ipratropium-albuterol (DUONEB) 0.5-2.5 (3) MG/3ML SOLN Use 1 vial via nebulizer every 4 t0 6 hrs   Magnesium 400 MG TABS Take 400 mg by mouth daily.    mometasone (NASONEX) 50 MCG/ACT nasal spray Place 2 sprays into the nose daily.   mometasone-formoterol (DULERA) 200-5 MCG/ACT AERO Inhale 1 puff into the lungs 2 (two) times daily.   montelukast (SINGULAIR) 10 MG tablet Take 1 tablet (10 mg total) by mouth daily.   omeprazole (PRILOSEC) 40 MG capsule Take 1 capsule (40 mg total) by mouth 2 (two) times daily.   OXYGEN Inhale into the lungs. Oxygen at night, 2 liters   pneumococcal 23 valent  vaccine (PNEUMOVAX 23) 25 MCG/0.5ML injection Inject 0.107m IM once   Roflumilast (DALIRESP) 250 MCG TABS Take 1 tablet by mouth daily.   simvastatin (ZOCOR) 20 MG tablet TAKE 1 TABLET(20 MG) BY MOUTH EVERY EVENING FOR CHOLESTEROL   sucralfate (CARAFATE) 1 g tablet TAKE 1 TABLET BY MOUTH FOUR TIMES DAILY AT BEDTIME WITH MEALS   tiotropium (SPIRIVA HANDIHALER) 18 MCG inhalation capsule INHALE CONTENTS OF 1 CAPSULE ONCE DAILY USING HANDIHALER   vitamin B-12 (CYANOCOBALAMIN) 1000 MCG tablet Take 1,000 mcg by mouth daily.   vitamin E 180 MG (400 UNITS) capsule Take by mouth.   [DISCONTINUED] alendronate (FOSAMAX) 70 MG tablet Take 70 mg by mouth once a week. Take with a full glass of water on an empty stomach. (Patient not taking: Reported on 09/13/2021)   [DISCONTINUED] ALPRAZolam (XANAX) 0.25 MG tablet Take 1 tablet (0.25 mg total) by mouth 3 (three) times daily as needed for anxiety.   [DISCONTINUED] furosemide (LASIX) 40 MG tablet TAKE 1 TABLET( 40 MG TOTAL) BY MOUTH DAILY   [DISCONTINUED] Ipratropium-Albuterol (COMBIVENT RESPIMAT) 20-100 MCG/ACT AERS respimat INHALE 1 PUFF INTO THE LUNGS EVERY 6 HOURS AS NEEDED FOR WHEEZING OR SHORTNESS OF BREATH   [DISCONTINUED] ipratropium-albuterol (DUONEB) 0.5-2.5 (3) MG/3ML SOLN Use 1 vial via nebulizer every 4 t0 6 hrs   [DISCONTINUED] mometasone-formoterol (DULERA) 200-5  MCG/ACT AERO Inhale 1 puff into the lungs 2 (two) times daily.   [DISCONTINUED] Potassium Chloride CR (MICRO-K) 8 MEQ CPCR capsule CR Take 1 capsule (8 mEq total) by mouth daily.   [DISCONTINUED] Roflumilast (DALIRESP) 250 MCG TABS Take 1 tablet by mouth daily.   [DISCONTINUED] simvastatin (ZOCOR) 20 MG tablet TAKE 1 TABLET(20 MG) BY MOUTH EVERY EVENING FOR CHOLESTEROL   [DISCONTINUED] tiotropium (SPIRIVA HANDIHALER) 18 MCG inhalation capsule INHALE CONTENTS OF 1 CAPSULE ONCE DAILY USING HANDIHALER   No facility-administered encounter medications on file as of 09/13/2021.    Surgical  History: Past Surgical History:  Procedure Laterality Date   ESOPHAGOGASTRODUODENOSCOPY N/A 07/29/2018   Procedure: ESOPHAGOGASTRODUODENOSCOPY (EGD);  Surgeon: Toledo, Benay Pike, MD;  Location: [facility] ENDOSCOPY;  Service: Gastroenterology;  Laterality: N/A;   KIDNEY STONE SURGERY     TONSILLECTOMY      Medical History: Past Medical History:  Diagnosis Date   COPD (chronic obstructive pulmonary disease) (HCC)    Endometriosis    GERD (gastroesophageal reflux disease)    Hyperlipidemia    Kidney stones     Family History: Family History  Problem Relation Age of Onset   Hypertension Mother    Hyperlipidemia Mother    GER disease Mother    Heart attack Father        80 yrs ago   Hypertension Father    Parkinson's disease Father    GER disease Father     Social History   Socioeconomic History   Marital status: Married    Spouse name: Not on file   Number of children: Not on file   Years of education: Not on file   Highest education level: Not on file  Occupational History   Not on file  Tobacco Use   Smoking status: Every Day    Packs/day: 1.00    Types: Cigarettes    Start date: 11/09/2017   Smokeless tobacco: Never   Tobacco comments:    1 pack a day   Vaping Use   Vaping Use: Former  Substance and Sexual Activity   Alcohol use: Yes    Comment: ocassionally    Drug use: No   Sexual activity: Not on file  Other Topics Concern   Not on file  Social History Narrative   Not on file   Social Determinants of Health   Financial Resource Strain: Not on file  Food Insecurity: Not on file  Transportation Needs: Not on file  Physical Activity: Not on file  Stress: Not on file  Social Connections: Not on file  Intimate Partner Violence: Not on file      Review of Systems  Constitutional:  Negative for activity change, appetite change, chills, fatigue, fever and unexpected weight change.  HENT: Negative.  Negative for congestion, ear pain, rhinorrhea, sore  throat and trouble swallowing.   Eyes: Negative.   Respiratory: Negative.  Negative for cough, chest tightness, shortness of breath and wheezing.   Cardiovascular: Negative.  Negative for chest pain.  Gastrointestinal: Negative.  Negative for abdominal pain, blood in stool, constipation, diarrhea, nausea and vomiting.  Endocrine: Negative.   Genitourinary: Negative.  Negative for difficulty urinating, dysuria, frequency, hematuria and urgency.  Musculoskeletal: Negative.  Negative for arthralgias, back pain, joint swelling, myalgias and neck pain.  Skin: Negative.  Negative for rash and wound.  Allergic/Immunologic: Negative.  Negative for immunocompromised state.  Neurological: Negative.  Negative for dizziness, seizures, numbness and headaches.  Hematological: Negative.   Psychiatric/Behavioral: Negative.  Negative  for behavioral problems, self-injury and suicidal ideas. The patient is not nervous/anxious.    Vital Signs: BP 110/64   Pulse 82   Temp 97.8 F (36.6 C)   Ht _0  (1.626 m)   Wt 125 lb 6.4 oz (56.9 kg)   SpO2 93%   BMI 21.52 kg/m    Physical Exam Vitals reviewed.  Constitutional:      General: She is awake. She is not in acute distress.    Appearance: Normal appearance. She is well-developed, well-groomed and normal weight. She is not ill-appearing or diaphoretic.  HENT:     Head: Normocephalic and atraumatic.     Right Ear: Tympanic membrane, ear canal and external ear normal.     Left Ear: Tympanic membrane, ear canal and external ear normal.     Nose: Nose normal. No congestion or rhinorrhea.     Mouth/Throat:     Lips: Pink.     Mouth: Mucous membranes are moist.     Pharynx: Oropharynx is clear. Uvula midline. No oropharyngeal exudate or posterior oropharyngeal erythema.  Eyes:     General: Lids are normal. Vision grossly intact. Gaze aligned appropriately. No scleral icterus.       Right eye: No discharge.        Left eye: No discharge.     Extraocular  Movements: Extraocular movements intact.     Conjunctiva/sclera: Conjunctivae normal.     Pupils: Pupils are equal, round, and reactive to light.     Funduscopic exam:    Right eye: Red reflex present.        Left eye: Red reflex present. Neck:     Thyroid: No thyromegaly.     Vascular: No JVD.     Trachea: Trachea and phonation normal. No tracheal deviation.  Cardiovascular:     Rate and Rhythm: Normal rate and regular rhythm.     Pulses: Normal pulses.     Heart sounds: Normal heart sounds, S1 normal and S2 normal. No murmur heard.   No friction rub. No gallop.  Pulmonary:     Effort: Pulmonary effort is normal. No accessory muscle usage or respiratory distress.     Breath sounds: Normal breath sounds and air entry. No stridor. No wheezing or rales.  Chest:     Chest wall: No tenderness.     Comments: Declined clinical breast exam, mammogram ordered.  Abdominal:     General: Bowel sounds are normal. There is no distension.     Palpations: Abdomen is soft. There is no shifting dullness, fluid wave, mass or pulsatile mass.     Tenderness: There is no abdominal tenderness. There is no guarding or rebound.  Musculoskeletal:        General: No tenderness or deformity. Normal range of motion.     Cervical back: Normal range of motion and neck supple.  Lymphadenopathy:     Cervical: No cervical adenopathy.  Skin:    General: Skin is warm and dry.     Capillary Refill: Capillary refill takes less than 2 seconds.     Coloration: Skin is not pale.     Findings: No erythema or rash.  Neurological:     Mental Status: She is alert and oriented to person, place, and time.     Cranial Nerves: No cranial nerve deficit.     Motor: No abnormal muscle tone.     Coordination: Coordination normal.     Gait: Gait normal.     Deep Tendon Reflexes:  Reflexes are normal and symmetric.  Psychiatric:        Mood and Affect: Mood and affect normal.        Behavior: Behavior normal. Behavior is  cooperative.        Thought Content: Thought content normal.        Judgment: Judgment normal.       Assessment/Plan: 1. Encounter for general adult medical examination with abnormal findings Age-appropriate preventive screenings and vaccinations discussed, annual physical exam completed. Routine labs for health maintenance ordered, see below. PHM updated.   2. Obstructive chronic bronchitis without exacerbation ([facility]) Daliresp prescribed, refills ordered.  - Roflumilast (DALIRESP) 250 MCG TABS; Take 1 tablet by mouth daily.  Dispense: 30 tablet; Refill: 4 - mometasone-formoterol (DULERA) 200-5 MCG/ACT AERO; Inhale 1 puff into the lungs 2 (two) times daily.  Dispense: 8.8 g; Refill: 5 - tiotropium (SPIRIVA HANDIHALER) 18 MCG inhalation capsule; INHALE CONTENTS OF 1 CAPSULE ONCE DAILY USING HANDIHALER  Dispense: 30 capsule; Refill: 5 - ipratropium-albuterol (DUONEB) 0.5-2.5 (3) MG/3ML SOLN; Use 1 vial via nebulizer every 4 t0 6 hrs  Dispense: 360 mL; Refill: 3  3. Vitamin D deficiency Routine lab ordered - Vitamin D (25 hydroxy)  4. Mixed hyperlipidemia Refill ordered and routine lab ordered - simvastatin (ZOCOR) 20 MG tablet; TAKE 1 TABLET(20 MG) BY MOUTH EVERY EVENING FOR CHOLESTEROL  Dispense: 90 tablet; Refill: 1 - Lipid Profile - CMP14+EGFR  5. Anemia, unspecified type Routine lab ordered - CBC with Differential/Platelet - B12 and Folate Panel  6. Other dietary vitamin B12 deficiency anemia  Routine lab ordered - B12 and Folate Panel  7. Generalized anxiety disorder Refill ordered - ALPRAZolam (XANAX) 0.25 MG tablet; Take 1 tablet (0.25 mg total) by mouth 3 (three) times daily as needed for anxiety.  Dispense: 30 tablet; Refill: 2  8. Encounter for screening mammogram for malignant neoplasm of breast Screening mammogram ordered - MM DIGITAL SCREENING BILATERAL; Future  9. Dysuria Routine urinalysis done - UA/M w/rflx Culture, Routine - Microscopic Examination -  Urine Culture, Reflex     General Counseling: Tanea verbalizes understanding of the findings of todays visit and agrees with plan of treatment. I have discussed any further diagnostic evaluation that may be needed or ordered today. We also reviewed her medications today. she has been encouraged to call the office with any questions or concerns that should arise related to todays visit.    Orders Placed This Encounter  Procedures   Microscopic Examination   Urine Culture, Reflex   MM DIGITAL SCREENING BILATERAL   CBC with Differential/Platelet   Lipid Profile   CMP14+EGFR   Vitamin D (25 hydroxy)   B12 and Folate Panel   UA/M w/rflx Culture, Routine    Meds ordered this encounter  Medications   Roflumilast (DALIRESP) 250 MCG TABS    Sig: Take 1 tablet by mouth daily.    Dispense:  30 tablet    Refill:  4    Prior auth approved   mometasone-formoterol (DULERA) 200-5 MCG/ACT AERO    Sig: Inhale 1 puff into the lungs 2 (two) times daily.    Dispense:  8.8 g    Refill:  5   ALPRAZolam (XANAX) 0.25 MG tablet    Sig: Take 1 tablet (0.25 mg total) by mouth 3 (three) times daily as needed for anxiety.    Dispense:  30 tablet    Refill:  2   tiotropium (SPIRIVA HANDIHALER) 18 MCG inhalation capsule    Sig: INHALE  CONTENTS OF 1 CAPSULE ONCE DAILY USING HANDIHALER    Dispense:  30 capsule    Refill:  5   simvastatin (ZOCOR) 20 MG tablet    Sig: TAKE 1 TABLET(20 MG) BY MOUTH EVERY EVENING FOR CHOLESTEROL    Dispense:  90 tablet    Refill:  1   ipratropium-albuterol (DUONEB) 0.5-2.5 (3) MG/3ML SOLN    Sig: Use 1 vial via nebulizer every 4 t0 6 hrs    Dispense:  360 mL    Refill:  3    Return in about 3 months (around 12/14/2021) for F/U, Filip Luten PCP.   Total time spent:30 Minutes Time spent includes review of chart, medications, test results, and follow up plan with the patient.   [place] Controlled Substance Database was reviewed by me.  This patient was seen by Jonetta Osgood, FNP-C in collaboration with Dr. Clayborn Bigness as a part of collaborative care agreement.  Barbara Keng R. Valetta Fuller, MSN, FNP-C Internal medicine

## 2021-09-16 ENCOUNTER — Other Ambulatory Visit: Payer: Self-pay | Admitting: Internal Medicine

## 2021-09-19 LAB — UA/M W/RFLX CULTURE, ROUTINE
Bilirubin, UA: NEGATIVE
Glucose, UA: NEGATIVE
Ketones, UA: NEGATIVE
Nitrite, UA: NEGATIVE
Protein,UA: NEGATIVE
RBC, UA: NEGATIVE
Specific Gravity, UA: 1.022 (ref 1.005–1.030)
Urobilinogen, Ur: 0.2 mg/dL (ref 0.2–1.0)
pH, UA: 5.5 (ref 5.0–7.5)

## 2021-09-19 LAB — URINE CULTURE, REFLEX

## 2021-09-19 LAB — MICROSCOPIC EXAMINATION
Bacteria, UA: NONE SEEN
Casts: NONE SEEN /lpf
RBC, Urine: NONE SEEN /hpf (ref 0–2)

## 2021-09-24 ENCOUNTER — Other Ambulatory Visit: Payer: Self-pay | Admitting: Internal Medicine

## 2021-10-10 DIAGNOSIS — D513 Other dietary vitamin B12 deficiency anemia: Secondary | ICD-10-CM | POA: Diagnosis not present

## 2021-10-10 DIAGNOSIS — D649 Anemia, unspecified: Secondary | ICD-10-CM | POA: Diagnosis not present

## 2021-10-10 DIAGNOSIS — E782 Mixed hyperlipidemia: Secondary | ICD-10-CM | POA: Diagnosis not present

## 2021-10-10 DIAGNOSIS — E559 Vitamin D deficiency, unspecified: Secondary | ICD-10-CM | POA: Diagnosis not present

## 2021-10-10 DIAGNOSIS — S00411A Abrasion of right ear, initial encounter: Secondary | ICD-10-CM | POA: Diagnosis not present

## 2021-10-11 LAB — CMP14+EGFR
ALT: 19 IU/L (ref 0–32)
AST: 22 IU/L (ref 0–40)
Albumin/Globulin Ratio: 2.2 (ref 1.2–2.2)
Albumin: 4.8 g/dL (ref 3.8–4.8)
Alkaline Phosphatase: 124 IU/L — ABNORMAL HIGH (ref 44–121)
BUN/Creatinine Ratio: 15 (ref 12–28)
BUN: 11 mg/dL (ref 8–27)
Bilirubin Total: 0.6 mg/dL (ref 0.0–1.2)
CO2: 29 mmol/L (ref 20–29)
Calcium: 9.7 mg/dL (ref 8.7–10.3)
Chloride: 98 mmol/L (ref 96–106)
Creatinine, Ser: 0.72 mg/dL (ref 0.57–1.00)
Globulin, Total: 2.2 g/dL (ref 1.5–4.5)
Glucose: 109 mg/dL — ABNORMAL HIGH (ref 70–99)
Potassium: 3.7 mmol/L (ref 3.5–5.2)
Sodium: 145 mmol/L — ABNORMAL HIGH (ref 134–144)
Total Protein: 7 g/dL (ref 6.0–8.5)
eGFR: 92 mL/min/{1.73_m2} (ref >59)

## 2021-10-11 LAB — LIPID PANEL
Chol/HDL Ratio: 3.8 ratio (ref 0.0–4.4)
Cholesterol, Total: 272 mg/dL — ABNORMAL HIGH (ref 100–199)
HDL: 71 mg/dL (ref >39)
LDL Chol Calc (NIH): 177 mg/dL — ABNORMAL HIGH (ref 0–99)
Triglycerides: 137 mg/dL (ref 0–149)
VLDL Cholesterol Cal: 24 mg/dL (ref 5–40)

## 2021-10-11 LAB — CBC WITH DIFFERENTIAL/PLATELET
Basophils Absolute: 0.1 10*3/uL (ref 0.0–0.2)
Basos: 1 %
EOS (ABSOLUTE): 0.1 10*3/uL (ref 0.0–0.4)
Eos: 2 %
Hematocrit: 50.7 % — ABNORMAL HIGH (ref 34.0–46.6)
Hemoglobin: 17 g/dL — ABNORMAL HIGH (ref 11.1–15.9)
Immature Grans (Abs): 0 10*3/uL (ref 0.0–0.1)
Immature Granulocytes: 0 %
Lymphocytes Absolute: 3.4 10*3/uL — ABNORMAL HIGH (ref 0.7–3.1)
Lymphs: 44 %
MCH: 28.1 pg (ref 26.6–33.0)
MCHC: 33.5 g/dL (ref 31.5–35.7)
MCV: 84 fL (ref 79–97)
Monocytes Absolute: 0.6 10*3/uL (ref 0.1–0.9)
Monocytes: 8 %
Neutrophils Absolute: 3.7 10*3/uL (ref 1.4–7.0)
Neutrophils: 45 %
Platelets: 333 10*3/uL (ref 150–450)
RBC: 6.04 x10E6/uL — ABNORMAL HIGH (ref 3.77–5.28)
RDW: 14.4 % (ref 11.7–15.4)
WBC: 7.9 10*3/uL (ref 3.4–10.8)

## 2021-10-11 LAB — B12 AND FOLATE PANEL
Folate: >20 ng/mL (ref >3.0)
Vitamin B-12: 1222 pg/mL (ref 232–1245)

## 2021-10-11 LAB — VITAMIN D 25 HYDROXY (VIT D DEFICIENCY, FRACTURES): Vit D, 25-Hydroxy: 31.2 ng/mL (ref 30.0–100.0)

## 2021-10-22 ENCOUNTER — Telehealth: Payer: Self-pay

## 2021-10-22 DIAGNOSIS — F411 Generalized anxiety disorder: Secondary | ICD-10-CM

## 2021-10-22 MED ORDER — ALPRAZOLAM 0.25 MG PO TABS: 0.2500 mg | ORAL_TABLET | Freq: Three times a day (TID) | ORAL | 2 refills | Status: DC | PRN

## 2021-10-22 NOTE — Telephone Encounter (Signed)
Med sent to pharmacy.

## 2021-10-23 NOTE — Telephone Encounter (Signed)
Spoke with pt, she is aware med was sent to pharmacy

## 2021-10-29 ENCOUNTER — Telehealth: Payer: Self-pay

## 2021-10-29 NOTE — Telephone Encounter (Signed)
Oxygen therapy order signed by provider and placed in [facility] folder. 

## 2021-11-05 ENCOUNTER — Telehealth (INDEPENDENT_AMBULATORY_CARE_PROVIDER_SITE_OTHER): Payer: Medicare Other | Admitting: Physician Assistant

## 2021-11-05 DIAGNOSIS — U071 COVID-19: Secondary | ICD-10-CM | POA: Diagnosis not present

## 2021-11-05 DIAGNOSIS — J449 Chronic obstructive pulmonary disease, unspecified: Secondary | ICD-10-CM

## 2021-11-05 MED ORDER — AZITHROMYCIN 250 MG PO TABS: ORAL_TABLET | ORAL | 0 refills | Status: DC

## 2021-11-05 MED ORDER — PREDNISONE 10 MG PO TABS: ORAL_TABLET | ORAL | 0 refills | Status: DC

## 2021-11-05 NOTE — Progress Notes (Signed)
[facility] [place], [place] [address]  Internal MEDICINE  Telephone Visit  Patient Name: Rachael Blankenship [place]  [id number]  [id number]  Date of Service: 11/11/2021  I connected with the patient at 12:00 by telephone and verified the patients identity using two identifiers.   I discussed the limitations, risks, security and privacy concerns of performing an evaluation and management service by telephone and the availability of in person appointments. I also discussed with the patient that there may be a patient responsible charge related to the service.  The patient expressed understanding and agrees to proceed.    Chief Complaint  Patient presents with   Telephone Assessment    [phone]   Telephone Screen    Covid is positive    Sinusitis   Cough   Headache    Symptoms  start Friday     HPI Pt is here for virtual sick visit -She started having symptoms on Friday of cough, headache, congestion, decreased appetite, and some diarrhea -oxygen at night, using inhalers. Has not used nebulizer recently.  -Has tried Sinus headache vs equate headache pills as needed. -covid positive today -States her breathing has been ok if she is not exerting herself.  Current Medication: Outpatient Encounter Medications as of 11/05/2021  Medication Sig   ALPRAZolam (XANAX) 0.25 MG tablet Take 1 tablet (0.25 mg total) by mouth 3 (three) times daily as needed for anxiety.   ascorbic acid (VITAMIN C) 500 MG tablet Take 500 mg by mouth daily.   aspirin 81 MG tablet Take 81 mg by mouth at bedtime.    azithromycin (ZITHROMAX) 250 MG tablet Take one tab a day for 10 days for uri   Cholecalciferol 25 MCG (1000 UT) tablet Take 1,000 Units by mouth daily.    Coenzyme Q10 (CO Q-10) 100 MG CAPS Take 200 mg by mouth daily.    DENTA 5000 PLUS 1.1 % CREA dental cream Take 1 application by mouth daily.   escitalopram (LEXAPRO) 20 MG tablet Take 1 tablet (20 mg total) by mouth daily.    furosemide (LASIX) 40 MG tablet TAKE 1 TABLET BY MOUTH DAILY   ipratropium-albuterol (DUONEB) 0.5-2.5 (3) MG/3ML SOLN Use 1 vial via nebulizer every 4 t0 6 hrs   Magnesium 400 MG TABS Take 400 mg by mouth daily.    mometasone (NASONEX) 50 MCG/ACT nasal spray Place 2 sprays into the nose daily.   mometasone-formoterol (DULERA) 200-5 MCG/ACT AERO Inhale 1 puff into the lungs 2 (two) times daily.   montelukast (SINGULAIR) 10 MG tablet Take 1 tablet (10 mg total) by mouth daily.   omeprazole (PRILOSEC) 40 MG capsule Take 1 capsule (40 mg total) by mouth 2 (two) times daily.   OXYGEN Inhale into the lungs. Oxygen at night, 2 liters   pneumococcal 23 valent vaccine (PNEUMOVAX 23) 25 MCG/0.5ML injection Inject 0.52ml IM once   Potassium Chloride CR (MICRO-K) 8 MEQ CPCR capsule CR Take 1 capsule (8 mEq total) by mouth daily.   predniSONE (DELTASONE) 10 MG tablet Take one tab 3 x day for 3 days, then take one tab 2 x a day for 3 days and then take one tab a day for 3 days for copd   Roflumilast (DALIRESP) 250 MCG TABS Take 1 tablet by mouth daily.   simvastatin (ZOCOR) 20 MG tablet TAKE 1 TABLET(20 MG) BY MOUTH EVERY EVENING FOR CHOLESTEROL   sucralfate (CARAFATE) 1 g tablet TAKE 1 TABLET BY MOUTH FOUR TIMES DAILY AT BEDTIME WITH  MEALS   tiotropium (SPIRIVA HANDIHALER) 18 MCG inhalation capsule INHALE CONTENTS OF 1 CAPSULE ONCE DAILY USING HANDIHALER   vitamin B-12 (CYANOCOBALAMIN) 1000 MCG tablet Take 1,000 mcg by mouth daily.   vitamin E 180 MG (400 UNITS) capsule Take by mouth.   No facility-administered encounter medications on file as of 11/05/2021.    Surgical History: Past Surgical History:  Procedure Laterality Date   ESOPHAGOGASTRODUODENOSCOPY N/A 07/29/2018   Procedure: ESOPHAGOGASTRODUODENOSCOPY (EGD);  Surgeon: Toledo, Benay Pike, MD;  Location: [facility];  Service: Gastroenterology;  Laterality: N/A;   KIDNEY STONE SURGERY     TONSILLECTOMY      Medical History: Past Medical  History:  Diagnosis Date   COPD (chronic obstructive pulmonary disease) (HCC)    Endometriosis    GERD (gastroesophageal reflux disease)    Hyperlipidemia    Kidney stones     Family History: Family History  Problem Relation Age of Onset   Hypertension Mother    Hyperlipidemia Mother    GER disease Mother    Heart attack Father        39 yrs ago   Hypertension Father    Parkinson's disease Father    GER disease Father     Social History   Socioeconomic History   Marital status: Married    Spouse name: Not on file   Number of children: Not on file   Years of education: Not on file   Highest education level: Not on file  Occupational History   Not on file  Tobacco Use   Smoking status: Every Day    Packs/day: 1.00    Types: Cigarettes    Start date: 11/09/2017   Smokeless tobacco: Never   Tobacco comments:    1 pack a day   Vaping Use   Vaping Use: Former  Substance and Sexual Activity   Alcohol use: Yes    Comment: ocassionally    Drug use: No   Sexual activity: Not on file  Other Topics Concern   Not on file  Social History Narrative   Not on file   Social Determinants of Health   Financial Resource Strain: Not on file  Food Insecurity: Not on file  Transportation Needs: Not on file  Physical Activity: Not on file  Stress: Not on file  Social Connections: Not on file  Intimate Partner Violence: Not on file      Review of Systems  Constitutional:  Positive for fatigue. Negative for fever.  HENT:  Positive for congestion and postnasal drip. Negative for mouth sores.   Respiratory:  Positive for cough and shortness of breath. Negative for wheezing.   Cardiovascular:  Negative for chest pain.  Genitourinary:  Negative for flank pain.  Neurological:  Positive for headaches.  Psychiatric/Behavioral: Negative.     Vital Signs: Ht 5\' 4"  (1.626 m)   Wt 122 lb (55.3 kg)   BMI 20.94 kg/m    Observation/Objective:  Pt is able to carry out  conversation   Assessment/Plan: 1. Acute COVID-19 Will start on zpak and prednisone and may continue to treat headaches with tylenol. Advised to stay well hydrated and rest and use inhalers as prescribed and as indicated. - azithromycin (ZITHROMAX) 250 MG tablet; Take one tab a day for 10 days for uri  Dispense: 10 tablet; Refill: 0 - predniSONE (DELTASONE) 10 MG tablet; Take one tab 3 x day for 3 days, then take one tab 2 x a day for 3 days and then take one tab  a day for 3 days for copd  Dispense: 18 tablet; Refill: 0  2. Obstructive chronic bronchitis without exacerbation ([place]) Continue inhalers as prescribed and as indicated.   General Counseling: lamees gable understanding of the findings of today's phone visit and agrees with plan of treatment. I have discussed any further diagnostic evaluation that may be needed or ordered today. We also reviewed her medications today. she has been encouraged to call the office with any questions or concerns that should arise related to todays visit.    No orders of the defined types were placed in this encounter.   Meds ordered this encounter  Medications   azithromycin (ZITHROMAX) 250 MG tablet    Sig: Take one tab a day for 10 days for uri    Dispense:  10 tablet    Refill:  0   predniSONE (DELTASONE) 10 MG tablet    Sig: Take one tab 3 x day for 3 days, then take one tab 2 x a day for 3 days and then take one tab a day for 3 days for copd    Dispense:  18 tablet    Refill:  0    Time spent:25 Minutes    Dr Lavera Guise Internal medicine

## 2021-11-12 ENCOUNTER — Telehealth: Payer: Self-pay

## 2021-11-14 ENCOUNTER — Other Ambulatory Visit: Payer: Self-pay | Admitting: Nurse Practitioner

## 2021-11-14 DIAGNOSIS — F411 Generalized anxiety disorder: Secondary | ICD-10-CM

## 2021-11-14 MED ORDER — ALPRAZOLAM 0.25 MG PO TABS: 0.2500 mg | ORAL_TABLET | Freq: Three times a day (TID) | ORAL | 0 refills | Status: DC | PRN

## 2021-11-14 NOTE — Telephone Encounter (Signed)
error 

## 2021-11-20 ENCOUNTER — Other Ambulatory Visit: Payer: Self-pay

## 2021-11-20 DIAGNOSIS — J449 Chronic obstructive pulmonary disease, unspecified: Secondary | ICD-10-CM

## 2021-11-20 MED ORDER — MONTELUKAST SODIUM 10 MG PO TABS
10.0000 mg | ORAL_TABLET | Freq: Every day | ORAL | 5 refills | Status: DC
Start: 2021-11-20 — End: 2022-06-20

## 2021-12-05 ENCOUNTER — Other Ambulatory Visit: Payer: Self-pay

## 2021-12-05 ENCOUNTER — Ambulatory Visit (INDEPENDENT_AMBULATORY_CARE_PROVIDER_SITE_OTHER): Payer: Medicare Other | Admitting: Internal Medicine

## 2021-12-05 DIAGNOSIS — R0602 Shortness of breath: Secondary | ICD-10-CM

## 2021-12-05 LAB — PULMONARY FUNCTION TEST

## 2021-12-11 NOTE — Procedures (Signed)
[facility] [address] [place], [address]    Complete Pulmonary Function Testing Interpretation:  FINDINGS:  The forced vital capacity is severely decreased.  FEV1 is 0.71 L which is 31% of predicted and is severely decreased.  Postbronchodilator there is no significant change in the FEV1.  FEV1 FVC ratio was moderately decreased.  Total lung capacity is normal.  Residual volume is increased.  Residual volume for lung press ratio is increased.  FRC is increased.  DLCO is severely decreased.  IMPRESSION:  This pulmonary function study is consistent with severe obstructive lung disease.  Clinical correlation recommended  Allyne Gee, MD [facility] Pulmonary Critical Care Medicine Sleep Medicine

## 2021-12-12 ENCOUNTER — Other Ambulatory Visit: Payer: Self-pay

## 2021-12-12 ENCOUNTER — Ambulatory Visit: Payer: Medicare Other | Admitting: Student-PharmD

## 2021-12-12 DIAGNOSIS — Z720 Tobacco use: Secondary | ICD-10-CM

## 2021-12-12 DIAGNOSIS — J449 Chronic obstructive pulmonary disease, unspecified: Secondary | ICD-10-CM

## 2021-12-12 DIAGNOSIS — K219 Gastro-esophageal reflux disease without esophagitis: Secondary | ICD-10-CM

## 2021-12-12 DIAGNOSIS — E559 Vitamin D deficiency, unspecified: Secondary | ICD-10-CM

## 2021-12-12 DIAGNOSIS — F411 Generalized anxiety disorder: Secondary | ICD-10-CM

## 2021-12-12 DIAGNOSIS — E782 Mixed hyperlipidemia: Secondary | ICD-10-CM

## 2021-12-12 NOTE — Progress Notes (Signed)
Initial Pharmacist Visit   Rachael Blankenship, Rachael Blankenship  [id number] 66 years, Female  DOB: [birth year]  M: [id number]  Summary: -Pt primary health goal is to improve her breathing but is not currently interested in quitting smoking -Pt was not taking Simvastatin when last lipid panel was drawn but has started it back in the last 2 weeks.  Recommendations/Changes made from today's visit: -Taper down to Omeprazole 40mg  once daily and see how well that is tolerated -Continue other current medication therapy -Assist with PAP on Daliresp and Spiriva  Plan: F/U in 5 months   Visit Details Patient scheduled for CCM visit with the clinical pharmacist.  Patient is referred for CCM by their PCP and CPP is under general PCP supervision.: At least 2 of these conditions are expected to last 12 months or longer and patient is at significant risk for acute exacerbations and/or functional decline.  Patient has consented to participation in [place] program. Visit Type: Phone Call Date of Visit: 12/12/2021   Pre-Call Questions Are you able to connect with Patient: Yes Visit Type: Clinic visit Confirm visit date/time: 12/12/21 at 3:00 PM Have you seen any other providers since your last visit?: No Any changes in your medicines or health?: No Any side effects from any medications?: No Do you have any symptoms or problems not managed by your medications?: No What are your top 2 health concerns right now?: Patient stated her COPD. What are your top 2 medication concerns right now?: Cost Has your Dr asked that you take BP, BG or special diet at home?: Other Details: Patient stated none. Do you get any type of exercise on a regular basis?: No What are your top 1 or 2 goals for your health?: Patient stated to improve her breathing, she stated its getting harder and harder to do basic every day living duties. What are you doing to try to reach these goals?: Taking medications What, if any, problems do you have getting  your medications from the pharmacy?: None Is there anything else that you would like to discuss during the appointment?: No  Patient Chart Prep   Chronic Conditions Patient's Chronic Conditions: Chronic Obstructive Pulmonary Disease (COPD), Gastroesophageal Reflux Disease (GERD), Anxiety, Hyperlipidemia/Dyslipidemia (HLD)  Doctor and Hospital Visits Were there PCP Visits in last 6 months?: Yes Visit #1: 08/30/21 Dr. Humphrey Rolls For shortness of breath. No medication changes. Visit #2: 09/13/21 Jonetta Osgood, NP. For Medicare wellness. STARTED Ipratropium-Albuterol 0.5-2.5 MG/3ML use 1 vial nebulizer every 4 to 6 hrs. STOPPED Ipratropium-Albuterol  20-100 MCG/ACT. Visit #3: 11/05/21 Video Visit McDonough, Si Gaul, PA-C. For acute COVID-19. STARTED Azithromycin 250 mg 1 tablet daily for 10 days and Prednisone 10 mg dose pack, Were there Specialist Visits in last 6 months?: No Was there a Hospital Visit in last 30 days?: No Were there other Hospital Visits in last 6 months?: No  Medication Information Are there any Medication discrepancies?: No Are there any Medication adherence gaps (beyond 5 days past due)?: No Medication adherence rates for the STAR rating drugs: Simvastatin 20 mg 12/03/21 90 DS List Patient's current Care Gaps: No current Care Gaps identified  Disease Assessments  Subjective Information Current BP: 110/64 Current HR: 82 taken on: 09/12/2021 Previous BP: 112/68 Previous HR: 83 taken on: 08/29/2021 Weight: 125 BMI: 21.52 Last GFR: 92 taken on: 10/10/2021 Why did the patient present?: CCM Initial Visit Marital status?: Married Details: For 35 years Education level?: [employer] graduate Retired? 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 Will walk around the house for  activity Who does the patient spend their time with and what do they do?: see above Lifestyle habits such as diet and exercise?: Diet: Breakfast- normally just coffee, will eat later close to lunch Lunch- egg and toast Dinner- [place] if out to eat, meat and veggies at home Snacks-chips, nuts, popcorn, a little chocolate Drinks- Dr. Malachi Bonds, coffee, some water Exercise: walks around house and takes care of house Alcohol, tobacco, and illicit drug usage?: None What is the patient's sleep pattern?: No sleep issues How many hours per night does patient typically sleep?: 6+ Patient pleased with health care they are receiving?: Yes Family, occupational, and living circumstances relevant to overall health?: 82 year old mother lives with patient and patient helps take care of her; patient's COPD can limit her activity Factors that may affect medication adherence?: Pill burden Name and location of Current pharmacy: [facility] Current Rx insurance plan: Blue Medicare Are meds synced by current pharmacy?: No Are meds delivered by current pharmacy?: No - delivery available but patient prefers to not use Would patient benefit from direct intervention of clinical lead in dispensing process to optimize clinical outcomes?: Yes Are [facility] pharmacy services available where patient lives?: Yes Is patient disadvantaged to use [facility]?: No [facility] services reviewed with patient and patient wishes to change pharmacy?: No Select reason patient declined to change pharmacies: Loyalty to current pharmacy Does patient experience delays in picking up medications due to transportation concerns (getting to pharmacy)?: No Any additional demeanor/mood notes?: Patient presents via telephone and is very pleasant to speak with. Switched from office to phone because she felt a headache coming on.  Hyperlipidemia/Dyslipidemia (HLD) Last Lipid panel on: 10/10/2021 TC (Goal<200): 272 LDL: 177 HDL  (Goal>40): 71 TG (Goal<150): 137 ASCVD 10-year risk?is:: Intermediate (7.5%-20%) ASCVD Risk Score: 9.4 Assess this condition today?: Yes LDL Goal: <100 Has patient tried and failed any HLD Medications?: No We discussed: How to reduce cholesterol through diet/weight management and physical activity., How a diet high in plant sterols (fruits/vegetables/nuts/whole grains/legumes) may reduce your cholesterol. Assessment:: Uncontrolled Drug: Simvastatin 20mg  1 tablet daily Assessment: Appropriate, Effective, Safe, Accessible Additional Info: Patient had stopped taking Simvastatin and was not taking at time lipid panel was drawn. Has been back on it for two weeks now with no issues; would expect improvement in lipid panel values with continued adherence Plan to (other): Continue current medication therapy HC Follow up: 2 months Pharmacist Follow up: 5 months  Chronic Obstructive Pulmonary Disease (COPD) Current FEV1/FVC: 48% Current FEV1: 0.8 taken on: 12/05/2020 Current Eosinophils: 2 taken on: 10/10/2021 Assess this condition today?: Yes Gold group: A (low sx, < 2 exacerbations / yr) Exacerbations in past year without hospitalization: No Is patient currently Smoking or Vaping?: Yes See Tobacco Use Disorder section for details: Done Home oxygen therapy: Yes Details: 2L at night Frequency of SABA/SAMA use: Daily Influenza vaccine: No Prevnar vaccine: No Pneumovax vaccine: Yes We counseled the patient on:: Smoking cessation, Inhaler technique, Treatment goals (documented in Care Plan) Assessment:: Controlled Drug: Daliresp 270mcg 1 tablet daily Assessment: Appropriate, Effective, Safe, Accessible Drug: Spiriva 33mcg 1 puff daily Assessment: Appropriate, Effective, Safe, Accessible Drug: Oxygen 2L at bedtime Assessment: Appropriate, Effective, Safe, Accessible Drug: Dulera 200-5 1 puff twice daily Assessment:  Appropriate, Effective, Safe, Accessible Additional Info: Patient states  Daliresp and Spiriva are expensive. Will assist with PAP Plan to: Continue current medication therpay HC Follow up: 2 months Pharmacist Follow up: 5 months  Anxiety Assess this condition today?: Yes Completing the GAD-7 Questionnaire today?: No In your opinion, how do you feel your anxiety symptoms have been controlled over the past 3 months?: Stable / stayed the same Assessment:: Controlled Drug: Alprazolam 0.25mg  1 tablet three times daily as needed Assessment: Appropriate, Effective, Safe, Accessible Drug: Escitalopram 20mg  1 tablet daily Assessment: Appropriate, Effective, Safe, Accessible Additional Info: Patient states she typically only has to take the xanax twice daily, but will occasionally take the third if needed. Plan to: Continue current medication therapy. HC Follow up: 2 months Pharmacist Follow up: 5 months  GERD Assess this condition today?: Yes How frequently do you have symptoms of GERD or reflux?: occasionally When does patient experience reflux symptoms?: After meals Does patient experience difficulty or pain with swallowing?: No What OTC medications have you tried and what was the response?: None Is the patient a smoker or exposed to second-hand smoke?: Yes How long have you taken prescription medications for GERD?: many years What potential triggering foods have you identified and tried to limit intake in your diet?: none We discussed: Smoking cessation, Identifying and avoiding / limiting trigger foods Assessment:: Controlled Drug: Omeprazole 40mg  twice daily Assessment: Appropriate, Effective, Safe, Accessible Drug: Sucralfate 1g four times daily Assessment: Appropriate, Effective, Safe, Accessible Additional Info: Patient cannot recall the last time she has had a heartburn issue but has been a long time. Counseled patient that she may not need the Omeprazole anymore. Instructed patient to drop down to 1 capsule daily of Omeprazole to see how well dose  decrease is tolerated. Plan to: Continue Sucralfate twice daily and drop down to Omeprazole 40mg  1 capsule daily to check tolerability. HC Follow up: 2 months Pharmacist Follow up: 5 months  Tobacco Use Disorder Assess this condition today?: Yes Is patient currently Smoking or Vaping?: Yes How long has patient been smoking/vaping?: many years How much does patient smoke/vape per day?: Smokes 1ppd How soon after you wake up do you smoke your first cigarette?: Within 30 minutes What do you like about smoking?: helps curb appetite. What health problems has your smoking contributed to?: COPD Have you ever tried to quit smoking?: Yes How many times have you tried to quit?: At least twice What is the longest time you have stopped smoking?: unsure Was it difficult or easy to quit smoking?: difficult When did you last try to quit smoking?: cannot recall Why did you try to quit smoking?: Improve overall health How do you feel about quitting smoking now or in the near future?: Not interested in quitting / haven't thought about it What are your concerns about quitting?: Weight gain Patient has tried and failed: Nicotine patches (irritate the skin) and chantix Assessment:: Uncontrolled Drug: None Assessment: Query need Additional Info: Patient is not interested in quitting at this time. Told patient I would check at each visit and informed her there are other options. Plan to (other): Evaluate at next visit [facility] Follow up: - Pharmacist Follow up: 5 months  Exercise, Diet and Non-Drug Coordination Needs Additional exercise counseling points. We discussed: targeting at least 150 minutes per week of moderate-intensity aerobic exercise. Additional diet counseling points. We discussed: aiming to consume at least 8 cups of water day Discussed Non-Drug Care Coordination Needs: Yes Does Patient have Medication financial barriers?:  No  Accountable Health Communities Health-Related Social Needs Screening  Tool -  SDOH  What is your living situation today? (ref #1): I have a steady place to live Think about the place you live. Do you have problems with any of the following? (ref #2): None of the above Within the past 12 months, you worried that your food would run out before you got money to buy more (ref #3): Never true Within the past 12 months, the food you bought just didn't last and you didn't have money to get more (ref #4): Never true In the past 12 months, has lack of reliable transportation kept you from medical appointments, meetings, work or from getting things needed for daily living? (ref #5): No In the past 12 months, has the electric, gas, oil, or water company threatened to shut off services in your home? (ref #6): No How often does anyone, including family and friends, physically hurt you? (ref #7): Never (1) How often does anyone, including family and friends, insult or talk down to you? (ref #8): Never (1) How often does anyone, including friends and family, threaten you with harm? (ref #9): Never (1) How often does anyone, including family and friends, scream or curse at you? (ref #10): Never (1)  Steps to Quit Smoking Smoking tobacco is the leading cause of preventable death. It can affect almost every organ in the body. Smoking puts you and people around you at risk for many serious, long-lasting (chronic) diseases. Quitting smoking can be hard, but it is one of the best things that you can do for your health. It is never too late to quit. How do I get ready to quit? When you decide to quit smoking, make a plan to help you succeed. Before you quit: Pick a date to quit. Set a date within the next 2 weeks to give you time to prepare. Write down the reasons why you are quitting. Keep this list in places where you will see it often. Tell your family, friends, and co-workers that you are quitting. Their support is important. Talk with your doctor about the choices that may help you  quit. Find out if your health insurance will pay for these treatments. Know the people, places, things, and activities that make you want to smoke (triggers). Avoid them. What first steps can I take to quit smoking? Throw away all cigarettes at home, at work, and in your car. Throw away the things that you use when you smoke, such as ashtrays and lighters. Clean your car. Make sure to empty the ashtray. Clean your home, including curtains and carpets. What can I do to help me quit smoking? Talk with your doctor about taking medicines and seeing a counselor at the same time. You are more likely to succeed when you do both. If you are pregnant or breastfeeding, talk with your doctor about counseling or other ways to quit smoking. Do not take medicine to help you quit smoking unless your doctor tells you to do so. To quit smoking: Quit right away Quit smoking totally, instead of slowly cutting back on how much you smoke over a period of time. Go to counseling. You are more likely to quit if you go to counseling sessions regularly. Take medicine You may take medicines to help you quit. Some medicines need a prescription, and some you can buy over-the-counter. Some medicines may contain a drug called nicotine to replace the nicotine in cigarettes. Medicines may: Help you to stop having  the desire to smoke (cravings). Help to stop the problems that come when you stop smoking (withdrawal symptoms). Your doctor may ask you to use: Nicotine patches, gum, or lozenges. Nicotine inhalers or sprays. Non-nicotine medicine that is taken by mouth. Find resources Find resources and other ways to help you quit smoking and remain smoke-free after you quit. These resources are most helpful when you use them often. They include: Online chats with a Social worker. Phone quitlines. Printed Furniture conservator/restorer. Support groups or group counseling. Text messaging programs. Mobile phone apps. Use apps on your mobile  phone or tablet that can help you stick to your quit plan. There are many free apps for mobile phones and tablets as well as websites. Examples include Quit Guide from the [employer] and smokefree.gov   What things can I do to make it easier to quit? Talk to your family and friends. Ask them to support and encourage you. Call a phone quitline (1-800-QUIT-NOW), reach out to support groups, or work with a Social worker. Ask people who smoke to not smoke around you. Avoid places that make you want to smoke, such as: Bars. Parties. Smoke-break areas at work. Spend time with people who do not smoke. Lower the stress in your life. Stress can make you want to smoke. Try these things to help your stress: Getting regular exercise. Doing deep-breathing exercises. Doing yoga. Meditating. Doing a body scan. To do this, close your eyes, focus on one area of your body at a time from head to toe. Notice which parts of your body are tense. Try to relax the muscles in those areas. How will I feel when I quit smoking? Day 1 to 3 weeks Within the first 24 hours, you may start to have some problems that come from quitting tobacco. These problems are very bad 2-3 days after you quit, but they do not often last for more than 2-3 weeks. You may get these symptoms: Mood swings. Feeling restless, nervous, angry, or annoyed. Trouble concentrating. Dizziness. Strong desire for high-sugar foods and nicotine. Weight gain. Trouble pooping (constipation). Feeling like you may vomit (nausea). Coughing or a sore throat. Changes in how the medicines that you take for other issues work in your body. Depression. Trouble sleeping (insomnia). Week 3 and afterward After the first 2-3 weeks of quitting, you may start to notice more positive results, such as: Better sense of smell and taste. Less coughing and sore throat. Slower heart rate. Lower blood pressure. Clearer skin. Better breathing. Fewer sick days. Quitting smoking  can be hard. Do not give up if you fail the first time. Some people need to try a few times before they succeed. Do your best to stick to your quit plan, and talk with your doctor if you have any questions or concerns. Summary Smoking tobacco is the leading cause of preventable death. Quitting smoking can be hard, but it is one of the best things that you can do for your health. When you decide to quit smoking, make a plan to help you succeed. Quit smoking right away, not slowly over a period of time. When you start quitting, seek help from your doctor, family, or friends. This information is not intended to replace advice given to you by your health care provider. Make sure you discuss any questions you have with your health care provider.         Alena Bills Clinical Pharmacist [phone]

## 2021-12-13 ENCOUNTER — Telehealth: Payer: Self-pay | Admitting: Student-PharmD

## 2021-12-13 NOTE — Progress Notes (Signed)
°  Chronic Care Management Pharmacy Assistant   Name: VALARI TAYLOR  MRN: [id number] DOB: May 25, 1956   Reason for Encounter: PAP  Medications: Outpatient Encounter Medications as of 12/13/2021  Medication Sig   ALPRAZolam (XANAX) 0.25 MG tablet Take 1 tablet (0.25 mg total) by mouth 3 (three) times daily as needed for anxiety.   ascorbic acid (VITAMIN C) 500 MG tablet Take 500 mg by mouth daily.   aspirin 81 MG tablet Take 81 mg by mouth at bedtime.    azithromycin (ZITHROMAX) 250 MG tablet Take one tab a day for 10 days for uri   Cholecalciferol 25 MCG (1000 UT) tablet Take 1,000 Units by mouth daily.    Coenzyme Q10 (CO Q-10) 100 MG CAPS Take 200 mg by mouth daily.    DENTA 5000 PLUS 1.1 % CREA dental cream Take 1 application by mouth daily.   escitalopram (LEXAPRO) 20 MG tablet Take 1 tablet (20 mg total) by mouth daily.   furosemide (LASIX) 40 MG tablet TAKE 1 TABLET BY MOUTH DAILY   ipratropium-albuterol (DUONEB) 0.5-2.5 (3) MG/3ML SOLN Use 1 vial via nebulizer every 4 t0 6 hrs   Magnesium 400 MG TABS Take 400 mg by mouth daily.    mometasone (NASONEX) 50 MCG/ACT nasal spray Place 2 sprays into the nose daily.   mometasone-formoterol (DULERA) 200-5 MCG/ACT AERO Inhale 1 puff into the lungs 2 (two) times daily.   montelukast (SINGULAIR) 10 MG tablet Take 1 tablet (10 mg total) by mouth daily.   omeprazole (PRILOSEC) 40 MG capsule Take 1 capsule (40 mg total) by mouth 2 (two) times daily.   OXYGEN Inhale into the lungs. Oxygen at night, 2 liters   pneumococcal 23 valent vaccine (PNEUMOVAX 23) 25 MCG/0.5ML injection Inject 0.51ml IM once   Potassium Chloride CR (MICRO-K) 8 MEQ CPCR capsule CR Take 1 capsule (8 mEq total) by mouth daily.   predniSONE (DELTASONE) 10 MG tablet Take one tab 3 x day for 3 days, then take one tab 2 x a day for 3 days and then take one tab a day for 3 days for copd   Roflumilast (DALIRESP) 250 MCG TABS Take 1 tablet by mouth daily.   simvastatin (ZOCOR) 20 MG  tablet TAKE 1 TABLET(20 MG) BY MOUTH EVERY EVENING FOR CHOLESTEROL   sucralfate (CARAFATE) 1 g tablet TAKE 1 TABLET BY MOUTH FOUR TIMES DAILY AT BEDTIME WITH MEALS   tiotropium (SPIRIVA HANDIHALER) 18 MCG inhalation capsule INHALE CONTENTS OF 1 CAPSULE ONCE DAILY USING HANDIHALER   vitamin B-12 (CYANOCOBALAMIN) 1000 MCG tablet Take 1,000 mcg by mouth daily.   vitamin E 180 MG (400 UNITS) capsule Take by mouth.   No facility-administered encounter medications on file as of 12/13/2021.    New patient assistance application form filled out to [place] for Spiriva and Deliresp. Waiting for patient and provider to complete and sign documentation. Called patient to inquire if they wanted the application mailed to them or if they wanted to come into the office. Patient is required to sign application and to bring/have proof of income. She stated she would be willing to come into office to bring proof of income and sign application once the paperwork comes in the mail she would like it mailed to their residence address [place] [address].  Charlann Lange, [place]  [phone]

## 2021-12-17 ENCOUNTER — Encounter: Payer: Self-pay | Admitting: Nurse Practitioner

## 2021-12-17 ENCOUNTER — Ambulatory Visit
Admission: RE | Admit: 2021-12-17 | Discharge: 2021-12-17 | Disposition: A | Discharge status: Home / Self Care | Payer: Medicare Other | Source: Ambulatory Visit | Attending: Nurse Practitioner | Admitting: Nurse Practitioner

## 2021-12-17 ENCOUNTER — Ambulatory Visit (INDEPENDENT_AMBULATORY_CARE_PROVIDER_SITE_OTHER): Payer: Medicare Other | Admitting: Nurse Practitioner

## 2021-12-17 ENCOUNTER — Ambulatory Visit
Admission: RE | Admit: 2021-12-17 | Discharge: 2021-12-17 | Disposition: A | Discharge status: Home / Self Care | Payer: Medicare Other | Attending: Nurse Practitioner | Admitting: Nurse Practitioner

## 2021-12-17 ENCOUNTER — Other Ambulatory Visit: Payer: Self-pay

## 2021-12-17 ENCOUNTER — Telehealth: Payer: Self-pay

## 2021-12-17 VITALS — BP 118/70 | HR 85 | Temp 98.5°F | Resp 16 | Ht 64.0 in | Wt 121.2 lb

## 2021-12-17 DIAGNOSIS — G44209 Tension-type headache, unspecified, not intractable: Secondary | ICD-10-CM | POA: Diagnosis not present

## 2021-12-17 DIAGNOSIS — R062 Wheezing: Secondary | ICD-10-CM

## 2021-12-17 DIAGNOSIS — S2231XA Fracture of one rib, right side, initial encounter for closed fracture: Secondary | ICD-10-CM | POA: Diagnosis not present

## 2021-12-17 DIAGNOSIS — R0902 Hypoxemia: Secondary | ICD-10-CM | POA: Diagnosis not present

## 2021-12-17 DIAGNOSIS — Z23 Encounter for immunization: Secondary | ICD-10-CM | POA: Diagnosis not present

## 2021-12-17 DIAGNOSIS — R059 Cough, unspecified: Secondary | ICD-10-CM | POA: Diagnosis not present

## 2021-12-17 DIAGNOSIS — R0602 Shortness of breath: Secondary | ICD-10-CM

## 2021-12-17 DIAGNOSIS — F411 Generalized anxiety disorder: Secondary | ICD-10-CM | POA: Diagnosis not present

## 2021-12-17 DIAGNOSIS — J449 Chronic obstructive pulmonary disease, unspecified: Secondary | ICD-10-CM

## 2021-12-17 MED ORDER — PREDNISONE 10 MG PO TABS: ORAL_TABLET | ORAL | 0 refills | Status: DC

## 2021-12-17 MED ORDER — TETANUS-DIPHTH-ACELL PERTUSSIS 5-2.5-18.5 LF-MCG/0.5 IM SUSP: 0.5000 mL | Freq: Once | INTRAMUSCULAR | 0 refills | Status: AC

## 2021-12-17 MED ORDER — PNEUMOCOCCAL 20-VAL CONJ VACC 0.5 ML IM SUSY: 0.5000 mL | PREFILLED_SYRINGE | INTRAMUSCULAR | 0 refills | Status: AC

## 2021-12-17 MED ORDER — AZITHROMYCIN 250 MG PO TABS: ORAL_TABLET | ORAL | 0 refills | Status: DC

## 2021-12-17 MED ORDER — ALPRAZOLAM 0.25 MG PO TABS: 0.2500 mg | ORAL_TABLET | Freq: Three times a day (TID) | ORAL | 0 refills | Status: DC | PRN

## 2021-12-17 MED ORDER — BUTALBITAL-APAP-CAFFEINE 50-325-40 MG PO TABS: 1.0000 | ORAL_TABLET | Freq: Four times a day (QID) | ORAL | 0 refills | Status: DC | PRN

## 2021-12-17 NOTE — Telephone Encounter (Signed)
Community message sent to sarah at [facility] for oxygen supplies and increase on oxygen to 4 LPM.

## 2021-12-17 NOTE — Progress Notes (Signed)
[facility] [place], [place] [address]  Internal MEDICINE  Office Visit Note  Patient Name: Rachael Blankenship  [id number]  [id number]  Date of Service: 12/17/2021  Chief Complaint  Patient presents with   Follow-up   Gastroesophageal Reflux   Hyperlipidemia   COPD   Medication Refill   Headache    Every morning when pt wakes up for the last week    HPI Rachael Blankenship presents for a follow-up visit for COPD, medication refills, and headache.  She reports that every morning for the past week she has woken up with a headache.  She does wear oxygen at night but has not had to wear oxygen during the day in the past.  She also reports having some nosebleeds most likely due to dry mucous membranes and runny nose.  At today's office visit, the patient's oxygen saturation was 88% on room air before even attempting a 6-minute walk test.  With this information she already qualifies for continuous home oxygen.    Current Medication: Outpatient Encounter Medications as of 12/17/2021  Medication Sig   ALPRAZolam (XANAX) 0.25 MG tablet Take 1 tablet (0.25 mg total) by mouth 3 (three) times daily as needed for anxiety.   ascorbic acid (VITAMIN C) 500 MG tablet Take 500 mg by mouth daily.   aspirin 81 MG tablet Take 81 mg by mouth at bedtime.    azithromycin (ZITHROMAX) 250 MG tablet Take one tab a day for 10 days for uri   Cholecalciferol 25 MCG (1000 UT) tablet Take 1,000 Units by mouth daily.    Coenzyme Q10 (CO Q-10) 100 MG CAPS Take 200 mg by mouth daily.    DENTA 5000 PLUS 1.1 % CREA dental cream Take 1 application by mouth daily.   escitalopram (LEXAPRO) 20 MG tablet Take 1 tablet (20 mg total) by mouth daily.   furosemide (LASIX) 40 MG tablet TAKE 1 TABLET BY MOUTH DAILY   ipratropium-albuterol (DUONEB) 0.5-2.5 (3) MG/3ML SOLN Use 1 vial via nebulizer every 4 t0 6 hrs   Magnesium 400 MG TABS Take 400 mg by mouth daily.    mometasone (NASONEX) 50 MCG/ACT nasal spray Place  2 sprays into the nose daily.   mometasone-formoterol (DULERA) 200-5 MCG/ACT AERO Inhale 1 puff into the lungs 2 (two) times daily.   montelukast (SINGULAIR) 10 MG tablet Take 1 tablet (10 mg total) by mouth daily.   omeprazole (PRILOSEC) 40 MG capsule Take 1 capsule (40 mg total) by mouth 2 (two) times daily.   OXYGEN Inhale into the lungs. Oxygen at night, 2 liters   pneumococcal 23 valent vaccine (PNEUMOVAX 23) 25 MCG/0.5ML injection Inject 0.4ml IM once   Potassium Chloride CR (MICRO-K) 8 MEQ CPCR capsule CR Take 1 capsule (8 mEq total) by mouth daily.   predniSONE (DELTASONE) 10 MG tablet Take one tab 3 x day for 3 days, then take one tab 2 x a day for 3 days and then take one tab a day for 3 days for copd   Roflumilast (DALIRESP) 250 MCG TABS Take 1 tablet by mouth daily.   simvastatin (ZOCOR) 20 MG tablet TAKE 1 TABLET(20 MG) BY MOUTH EVERY EVENING FOR CHOLESTEROL   sucralfate (CARAFATE) 1 g tablet TAKE 1 TABLET BY MOUTH FOUR TIMES DAILY AT BEDTIME WITH MEALS   tiotropium (SPIRIVA HANDIHALER) 18 MCG inhalation capsule INHALE CONTENTS OF 1 CAPSULE ONCE DAILY USING HANDIHALER   vitamin B-12 (CYANOCOBALAMIN) 1000 MCG tablet Take 1,000 mcg by mouth daily.  vitamin E 180 MG (400 UNITS) capsule Take by mouth.   [DISCONTINUED] pneumococcal 20-valent conjugate vaccine (PREVNAR 20) 0.5 ML injection Inject 0.5 mLs into the muscle tomorrow at 10 am.   [DISCONTINUED] Tdap (BOOSTRIX) 5-2.5-18.5 LF-MCG/0.5 injection Inject 0.5 mLs into the muscle once.   pneumococcal 20-valent conjugate vaccine (PREVNAR 20) 0.5 ML injection Inject 0.5 mLs into the muscle tomorrow at 10 am for 1 dose.   Tdap (BOOSTRIX) 5-2.5-18.5 LF-MCG/0.5 injection Inject 0.5 mLs into the muscle once for 1 dose.   [DISCONTINUED] azithromycin (ZITHROMAX) 250 MG tablet Take one tab a day for 10 days for uri (Patient not taking: Reported on 12/17/2021)   [DISCONTINUED] predniSONE (DELTASONE) 10 MG tablet Take one tab 3 x day for 3 days,  then take one tab 2 x a day for 3 days and then take one tab a day for 3 days for copd (Patient not taking: Reported on 12/17/2021)   No facility-administered encounter medications on file as of 12/17/2021.    Surgical History: Past Surgical History:  Procedure Laterality Date   ESOPHAGOGASTRODUODENOSCOPY N/A 07/29/2018   Procedure: ESOPHAGOGASTRODUODENOSCOPY (EGD);  Surgeon: Toledo, Benay Pike, MD;  Location: [facility] ENDOSCOPY;  Service: Gastroenterology;  Laterality: N/A;   KIDNEY STONE SURGERY     TONSILLECTOMY      Medical History: Past Medical History:  Diagnosis Date   COPD (chronic obstructive pulmonary disease) (HCC)    Endometriosis    GERD (gastroesophageal reflux disease)    Hyperlipidemia    Kidney stones     Family History: Family History  Problem Relation Age of Onset   Hypertension Mother    Hyperlipidemia Mother    GER disease Mother    Heart attack Father        12 yrs ago   Hypertension Father    Parkinson's disease Father    GER disease Father     Social History   Socioeconomic History   Marital status: Married    Spouse name: Not on file   Number of children: Not on file   Years of education: Not on file   Highest education level: Not on file  Occupational History   Not on file  Tobacco Use   Smoking status: Every Day    Packs/day: 1.00    Types: Cigarettes    Start date: 11/09/2017   Smokeless tobacco: Never   Tobacco comments:    1 pack a day   Vaping Use   Vaping Use: Former  Substance and Sexual Activity   Alcohol use: Yes    Comment: ocassionally    Drug use: No   Sexual activity: Not on file  Other Topics Concern   Not on file  Social History Narrative   Not on file   Social Determinants of Health   Financial Resource Strain: Not on file  Food Insecurity: Not on file  Transportation Needs: Not on file  Physical Activity: Not on file  Stress: Not on file  Social Connections: Not on file  Intimate Partner Violence: Not on file       Review of Systems  Constitutional:  Negative for chills, fatigue, fever and unexpected weight change.  HENT:  Positive for nosebleeds, postnasal drip and rhinorrhea. Negative for congestion, sneezing and sore throat.   Eyes:  Negative for redness.  Respiratory:  Positive for shortness of breath. Negative for cough, chest tightness and wheezing.   Cardiovascular: Negative.  Negative for chest pain and palpitations.  Gastrointestinal:  Negative for abdominal pain, diarrhea,  nausea and vomiting.  Genitourinary:  Negative for dysuria and frequency.  Musculoskeletal:  Negative for arthralgias, back pain, joint swelling and neck pain.  Skin:  Negative for rash.  Neurological:  Positive for headaches. Negative for tremors and numbness.  Hematological:  Negative for adenopathy. Does not bruise/bleed easily.  Psychiatric/Behavioral:  Negative for behavioral problems (Depression), sleep disturbance and suicidal ideas. The patient is not nervous/anxious.    Vital Signs: BP 118/70    Pulse 85    Temp 98.5 F (36.9 C)    Resp 16    Ht 5\' 4"  (1.626 m)    Wt 121 lb 3.2 oz (55 kg)    SpO2 (!) 88% Comment: room air   BMI 20.80 kg/m    Physical Exam Vitals reviewed.  Constitutional:      General: She is not in acute distress.    Appearance: Normal appearance. She is normal weight. She is not ill-appearing.  HENT:     Head: Normocephalic and atraumatic.  Eyes:     Pupils: Pupils are equal, round, and reactive to light.  Cardiovascular:     Rate and Rhythm: Normal rate and regular rhythm.  Pulmonary:     Effort: Pulmonary effort is normal. No respiratory distress.  Neurological:     Mental Status: She is alert and oriented to person, place, and time.     Cranial Nerves: No cranial nerve deficit.     Coordination: Coordination normal.     Gait: Gait normal.  Psychiatric:        Mood and Affect: Mood normal.        Behavior: Behavior normal.       Assessment/Plan: 1. COPD with  hypoxia ([place]) Patient already has oxygen for nighttime due to having nocturnal hypoxia when she is sleeping but she now qualifies for continuous oxygen. New DME order sent. Chest xray ordered to rule out pneumonia as well.  - 6 minute walk; Future - For home use only DME oxygen - azithromycin (ZITHROMAX) 250 MG tablet; Take one tab a day for 10 days for uri  Dispense: 10 tablet; Refill: 0 - DG Chest 2 View; Future  2. SOB (shortness of breath) on exertion Was 88% before attempting 6 minute walk, patient qualifies for continuous home oxygen - 6 minute walk; Future  3. Wheezing Empiric antibiotic treatment prescribed. Prednisone taper prescribed to decrease inflammation and wheezing, chest xray ordered to rule out pneumonia  - azithromycin (ZITHROMAX) 250 MG tablet; Take one tab a day for 10 days for uri  Dispense: 10 tablet; Refill: 0 - predniSONE (DELTASONE) 10 MG tablet; Take one tab 3 x day for 3 days, then take one tab 2 x a day for 3 days and then take one tab a day for 3 days for copd  Dispense: 18 tablet; Refill: 0 - DG Chest 2 View; Future  4. Generalized anxiety disorder Refills ordered.  - ALPRAZolam (XANAX) 0.25 MG tablet; Take 1 tablet (0.25 mg total) by mouth 3 (three) times daily as needed for anxiety.  Dispense: 90 tablet; Refill: 0  5. Need for vaccination - Tdap ([place]) 5-2.5-18.5 LF-MCG/0.5 injection; Inject 0.5 mLs into the muscle once for 1 dose.  Dispense: 0.5 mL; Refill: 0 - pneumococcal 20-valent conjugate vaccine (PREVNAR 20) 0.5 ML injection; Inject 0.5 mLs into the muscle tomorrow at 10 am for 1 dose.  Dispense: 0.5 mL; Refill: 0  6. Acute non intractable tension-type headache Prn medication prescribed for when she has a headache. - butalbital-acetaminophen-caffeine (  FIORICET) 50-325-40 MG tablet; Take 1 tablet by mouth every 6 (six) hours as needed for headache.  Dispense: 14 tablet; Refill: 0   General Counseling: Jamiya verbalizes understanding of the  findings of todays visit and agrees with plan of treatment. I have discussed any further diagnostic evaluation that may be needed or ordered today. We also reviewed her medications today. she has been encouraged to call the office with any questions or concerns that should arise related to todays visit.    Orders Placed This Encounter  Procedures   For home use only DME oxygen   DG Chest 2 View   6 minute walk    Meds ordered this encounter  Medications   Tdap (BOOSTRIX) 5-2.5-18.5 LF-MCG/0.5 injection    Sig: Inject 0.5 mLs into the muscle once for 1 dose.    Dispense:  0.5 mL    Refill:  0   pneumococcal 20-valent conjugate vaccine (PREVNAR 20) 0.5 ML injection    Sig: Inject 0.5 mLs into the muscle tomorrow at 10 am for 1 dose.    Dispense:  0.5 mL    Refill:  0   azithromycin (ZITHROMAX) 250 MG tablet    Sig: Take one tab a day for 10 days for uri    Dispense:  10 tablet    Refill:  0   predniSONE (DELTASONE) 10 MG tablet    Sig: Take one tab 3 x day for 3 days, then take one tab 2 x a day for 3 days and then take one tab a day for 3 days for copd    Dispense:  18 tablet    Refill:  0    Return in about 1 month (around 01/17/2022) for F/U, Travares Nelles PCP.   Total time spent:30 Minutes Time spent includes review of chart, medications, test results, and follow up plan with the patient.   [place] Controlled Substance Database was reviewed by me.  This patient was seen by Jonetta Osgood, FNP-C in collaboration with Dr. Clayborn Bigness as a part of collaborative care agreement.   Earsie Humm R. Valetta Fuller, MSN, FNP-C Internal medicine

## 2021-12-19 ENCOUNTER — Telehealth: Payer: Self-pay

## 2021-12-19 NOTE — Telephone Encounter (Signed)
Portable oxygen order signed by provider and placed in [facility] folder.

## 2021-12-23 DIAGNOSIS — K219 Gastro-esophageal reflux disease without esophagitis: Secondary | ICD-10-CM | POA: Diagnosis not present

## 2021-12-23 DIAGNOSIS — J449 Chronic obstructive pulmonary disease, unspecified: Secondary | ICD-10-CM | POA: Diagnosis not present

## 2021-12-23 DIAGNOSIS — E782 Mixed hyperlipidemia: Secondary | ICD-10-CM | POA: Diagnosis not present

## 2021-12-25 ENCOUNTER — Telehealth: Payer: Self-pay

## 2021-12-25 NOTE — Progress Notes (Signed)
Will discuss getting repeat labs at her next office visit in late february

## 2021-12-25 NOTE — Telephone Encounter (Signed)
-----   Message from Jonetta Osgood, NP sent at 12/25/2021  1:14 PM EST ----- Please call patient and let her know results. No active infection or pneumonia. There is a chronic seventh right rib fracture noted on imaging. Not sure if she knows about this.

## 2021-12-25 NOTE — Progress Notes (Signed)
Please call patient and let her know results. No active infection or pneumonia. There is a chronic seventh right rib fracture noted on imaging. Not sure if she knows about this.

## 2021-12-27 ENCOUNTER — Other Ambulatory Visit: Payer: Self-pay | Admitting: Internal Medicine

## 2021-12-27 DIAGNOSIS — K219 Gastro-esophageal reflux disease without esophagitis: Secondary | ICD-10-CM

## 2021-12-28 ENCOUNTER — Telehealth: Payer: Self-pay

## 2021-12-28 NOTE — Telephone Encounter (Signed)
Oxygen therapy order signed by [facility] and faxed back to [facility] at [phone]. Placed in [place] folder.

## 2022-01-08 ENCOUNTER — Encounter: Payer: Self-pay | Admitting: Internal Medicine

## 2022-01-13 ENCOUNTER — Encounter: Payer: Self-pay | Admitting: Nurse Practitioner

## 2022-01-21 ENCOUNTER — Ambulatory Visit (INDEPENDENT_AMBULATORY_CARE_PROVIDER_SITE_OTHER): Payer: Medicare Other | Admitting: Nurse Practitioner

## 2022-01-21 ENCOUNTER — Other Ambulatory Visit: Payer: Self-pay

## 2022-01-21 ENCOUNTER — Encounter: Payer: Self-pay | Admitting: Nurse Practitioner

## 2022-01-21 VITALS — BP 140/62 | HR 72 | Temp 98.7°F | Resp 16 | Ht 64.0 in | Wt 122.8 lb

## 2022-01-21 DIAGNOSIS — J449 Chronic obstructive pulmonary disease, unspecified: Secondary | ICD-10-CM | POA: Diagnosis not present

## 2022-01-21 DIAGNOSIS — E870 Hyperosmolality and hypernatremia: Secondary | ICD-10-CM

## 2022-01-21 DIAGNOSIS — R0602 Shortness of breath: Secondary | ICD-10-CM

## 2022-01-21 DIAGNOSIS — Z720 Tobacco use: Secondary | ICD-10-CM | POA: Diagnosis not present

## 2022-01-21 DIAGNOSIS — Z23 Encounter for immunization: Secondary | ICD-10-CM

## 2022-01-21 DIAGNOSIS — R0902 Hypoxemia: Secondary | ICD-10-CM

## 2022-01-21 DIAGNOSIS — R6889 Other general symptoms and signs: Secondary | ICD-10-CM

## 2022-01-21 DIAGNOSIS — J441 Chronic obstructive pulmonary disease with (acute) exacerbation: Secondary | ICD-10-CM | POA: Insufficient documentation

## 2022-01-21 DIAGNOSIS — F411 Generalized anxiety disorder: Secondary | ICD-10-CM

## 2022-01-21 DIAGNOSIS — D751 Secondary polycythemia: Secondary | ICD-10-CM | POA: Insufficient documentation

## 2022-01-21 MED ORDER — PNEUMOCOCCAL 20-VAL CONJ VACC 0.5 ML IM SUSY: 0.5000 mL | PREFILLED_SYRINGE | INTRAMUSCULAR | 0 refills | Status: AC

## 2022-01-21 MED ORDER — VARENICLINE TARTRATE 0.5 MG X 11 & 1 MG X 42 PO TBPK: ORAL_TABLET | ORAL | 0 refills | Status: DC

## 2022-01-21 MED ORDER — TETANUS-DIPHTH-ACELL PERTUSSIS 5-2.5-18.5 LF-MCG/0.5 IM SUSP
0.5000 mL | Freq: Once | INTRAMUSCULAR | 0 refills | Status: AC
Start: 2022-01-21 — End: 2022-01-21

## 2022-01-21 MED ORDER — ZOSTER VAC RECOMB ADJUVANTED 50 MCG/0.5ML IM SUSR: 0.5000 mL | Freq: Once | INTRAMUSCULAR | 0 refills | Status: AC

## 2022-01-21 MED ORDER — ROFLUMILAST 250 MCG PO TABS: 1.0000 | ORAL_TABLET | Freq: Every day | ORAL | 4 refills | Status: DC

## 2022-01-21 NOTE — Progress Notes (Signed)
[facility] [place], [place] [address]  Internal MEDICINE  Office Visit Note  Patient Name: Rachael Blankenship  [id number]  [id number]  Date of Service: 01/21/2022  Chief Complaint  Patient presents with   Follow-up    Discuss quitting smoking    Gastroesophageal Reflux   Hyperlipidemia   COPD   Results    HPI Isatou presents for a follow-up visit for COPD with hypoxia and use of continuous supplemental oxygen at home.  At her previous office visit her supplemental oxygen was only using 90 but patient was found to be hypoxic at rest and her supplemental oxygen was ordered to be continuous at 4 L.  She does okay when she is wearing the oxygen at home but if she needs to take it off to take a shower or other activities she cannot tolerate being off of the oxygen for long.  She becomes short of breath and cannot tolerate basic ADLs.  Her husband accompanied her to the office visit today and is also concerned with her continued smoking.  Patient has previously tried nicotine replacement therapy and Chantix but was never mentally ready or motivated to quit smoking until today.  Discussed the risks and possible complications of continued use of tobacco products.  Discussed the potential benefits of quitting smoking. --- She had a chest x-ray done after her last office visit to rule out pneumonia, pleural effusion or other possible changes in the lungs that could cause worsening of her breathing.  Her chest x-ray was deemed stable without active cardiopulmonary disease but the findings do mention that the lungs are mildly hyperinflated and there are mild chronic appearing increased lung markings seen.  There was no evidence of acute infiltrate, pleural effusion or pneumothorax and no cardiomegaly. --- She also has increased anxiety and stress.  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  Her anxiety and stress level is always more elevated first thing in  the morning.  She takes alprazolam as needed and usually takes 0.25 mg in the morning and another 0.25 mg in the evening.  She has a third dose she can take which she sometimes takes in the middle of the day if she needs it. --She reports having some fleeting chest pain during those periods of time when her anxiety level is increased.  She denies having chest pain now.  She had a fairly normal echocardiogram done in 2021.  She has not had one repeated since then.  Her last EKG was done in October 2021 and it was sinus rhythm with a nonspecific T abnormality.  She has not had an EKG done since that time.   Current Medication: Outpatient Encounter Medications as of 01/21/2022  Medication Sig   ALPRAZolam (XANAX) 0.25 MG tablet Take 1 tablet (0.25 mg total) by mouth 3 (three) times daily as needed for anxiety.   ascorbic acid (VITAMIN C) 500 MG tablet Take 500 mg by mouth daily.   aspirin 81 MG tablet Take 81 mg by mouth at bedtime.    butalbital-acetaminophen-caffeine (FIORICET) 50-325-40 MG tablet Take 1 tablet by mouth every 6 (six) hours as needed for headache.   Cholecalciferol 25 MCG (1000 UT) tablet Take 1,000 Units by mouth daily.    Coenzyme Q10 (CO Q-10) 100 MG CAPS Take 200 mg by mouth daily.    DENTA 5000 PLUS 1.1 % CREA dental cream Take 1 application by mouth daily.   escitalopram (LEXAPRO) 20 MG  tablet Take 1 tablet (20 mg total) by mouth daily.   furosemide (LASIX) 40 MG tablet TAKE 1 TABLET BY MOUTH DAILY   ipratropium-albuterol (DUONEB) 0.5-2.5 (3) MG/3ML SOLN Use 1 vial via nebulizer every 4 t0 6 hrs   Magnesium 400 MG TABS Take 400 mg by mouth daily.    mometasone (NASONEX) 50 MCG/ACT nasal spray Place 2 sprays into the nose daily.   montelukast (SINGULAIR) 10 MG tablet Take 1 tablet (10 mg total) by mouth daily.   omeprazole (PRILOSEC) 40 MG capsule Take 1 capsule (40 mg total) by mouth 2 (two) times daily.   OXYGEN Inhale into the lungs. Oxygen at night, 2 liters   pt uses [facility]  for oxygen supplies   Potassium Chloride CR (MICRO-K) 8 MEQ CPCR capsule CR Take 1 capsule (8 mEq total) by mouth daily.   simvastatin (ZOCOR) 20 MG tablet TAKE 1 TABLET(20 MG) BY MOUTH EVERY EVENING FOR CHOLESTEROL   sucralfate (CARAFATE) 1 g tablet TAKE 1 TABLET BY MOUTH FOUR TIMES DAILY AT BEDTIME WITH MEALS   varenicline (CHANTIX PAK) 0.5 MG X 11 & 1 MG X 42 tablet Take one 0.5 mg tablet by mouth once daily for 3 days, then increase to one 0.5 mg tablet twice daily for 4 days, then increase to one 1 mg tablet twice daily.   vitamin B-12 (CYANOCOBALAMIN) 1000 MCG tablet Take 1,000 mcg by mouth daily.   vitamin E 180 MG (400 UNITS) capsule Take by mouth.   [DISCONTINUED] azithromycin (ZITHROMAX) 250 MG tablet Take one tab a day for 10 days for uri   [DISCONTINUED] mometasone-formoterol (DULERA) 200-5 MCG/ACT AERO Inhale 1 puff into the lungs 2 (two) times daily.   [DISCONTINUED] pneumococcal 20-valent conjugate vaccine (PREVNAR 20) 0.5 ML injection Inject 0.5 mLs into the muscle tomorrow at 10 am.   [DISCONTINUED] pneumococcal 23 valent vaccine (PNEUMOVAX 23) 25 MCG/0.5ML injection Inject 0.54ml IM once   [DISCONTINUED] predniSONE (DELTASONE) 10 MG tablet Take one tab 3 x day for 3 days, then take one tab 2 x a day for 3 days and then take one tab a day for 3 days for copd   [DISCONTINUED] Roflumilast (DALIRESP) 250 MCG TABS Take 1 tablet by mouth daily.   [DISCONTINUED] Tdap (BOOSTRIX) 5-2.5-18.5 LF-MCG/0.5 injection Inject 0.5 mLs into the muscle once.   [DISCONTINUED] tiotropium (SPIRIVA HANDIHALER) 18 MCG inhalation capsule INHALE CONTENTS OF 1 CAPSULE ONCE DAILY USING HANDIHALER   [DISCONTINUED] Zoster Vaccine Adjuvanted [facility]) injection Inject into the muscle once.   mometasone-formoterol (DULERA) 200-5 MCG/ACT AERO Inhale 1 puff into the lungs 2 (two) times daily.   [EXPIRED] pneumococcal 20-valent conjugate vaccine (PREVNAR 20) 0.5 ML injection Inject 0.5 mLs into the muscle tomorrow at  10 am for 1 dose.   Roflumilast (DALIRESP) 250 MCG TABS Take 1 tablet by mouth daily.   [EXPIRED] Tdap (BOOSTRIX) 5-2.5-18.5 LF-MCG/0.5 injection Inject 0.5 mLs into the muscle once for 1 dose.   tiotropium (SPIRIVA HANDIHALER) 18 MCG inhalation capsule INHALE CONTENTS OF 1 CAPSULE ONCE DAILY USING HANDIHALER   [EXPIRED] Zoster Vaccine Adjuvanted [facility]) injection Inject 0.5 mLs into the muscle once for 1 dose.   No facility-administered encounter medications on file as of 01/21/2022.    Surgical History: Past Surgical History:  Procedure Laterality Date   ESOPHAGOGASTRODUODENOSCOPY N/A 07/29/2018   Procedure: ESOPHAGOGASTRODUODENOSCOPY (EGD);  Surgeon: Toledo, Benay Pike, MD;  Location: [facility] ENDOSCOPY;  Service: Gastroenterology;  Laterality: N/A;   KIDNEY STONE SURGERY     TONSILLECTOMY  Medical History: Past Medical History:  Diagnosis Date   COPD (chronic obstructive pulmonary disease) (HCC)    Endometriosis    GERD (gastroesophageal reflux disease)    Hyperlipidemia    Kidney stones     Family History: Family History  Problem Relation Age of Onset   Hypertension Mother    Hyperlipidemia Mother    GER disease Mother    Heart attack Father        73 yrs ago   Hypertension Father    Parkinson's disease Father    GER disease Father     Social History   Socioeconomic History   Marital status: Married    Spouse name: Not on file   Number of children: Not on file   Years of education: Not on file   Highest education level: Not on file  Occupational History   Not on file  Tobacco Use   Smoking status: Every Day    Packs/day: 1.00    Types: Cigarettes    Start date: 11/09/2017   Smokeless tobacco: Never   Tobacco comments:    1 pack a day   Vaping Use   Vaping Use: Former  Substance and Sexual Activity   Alcohol use: Yes    Comment: ocassionally    Drug use: No   Sexual activity: Not on file  Other Topics Concern   Not on file  Social History  Narrative   Not on file   Social Determinants of Health   Financial Resource Strain: Not on file  Food Insecurity: Not on file  Transportation Needs: Not on file  Physical Activity: Not on file  Stress: Not on file  Social Connections: Not on file  Intimate Partner Violence: Not on file      Review of Systems  Constitutional:  Positive for fatigue.  Respiratory:  Positive for cough, chest tightness and shortness of breath. Negative for wheezing.   Cardiovascular: Negative.  Negative for chest pain and palpitations.  Gastrointestinal: Negative.   Musculoskeletal: Negative.   Skin: Negative.   Neurological:  Positive for weakness. Negative for dizziness, light-headedness and headaches.  Psychiatric/Behavioral:  Positive for sleep disturbance. Negative for behavioral problems, self-injury and suicidal ideas. The patient is nervous/anxious.    Vital Signs: BP 140/62    Pulse 72    Temp 98.7 F (37.1 C)    Resp 16    Ht 5\' 4"  (1.626 m)    Wt 122 lb 12.8 oz (55.7 kg)    SpO2 93% Comment: with 4L oxygen   BMI 21.08 kg/m    Physical Exam Vitals reviewed.  Constitutional:      Appearance: She is normal weight. She is ill-appearing.  HENT:     Head: Normocephalic and atraumatic.  Eyes:     Pupils: Pupils are equal, round, and reactive to light.  Cardiovascular:     Rate and Rhythm: Normal rate and regular rhythm.     Heart sounds: Normal heart sounds, S1 normal and S2 normal. No murmur heard. Pulmonary:     Effort: Pulmonary effort is normal. No accessory muscle usage or respiratory distress.     Breath sounds: Normal air entry. Wheezing (intermittent) present.  Musculoskeletal:     Right lower leg: No edema.     Left lower leg: No edema.  Neurological:     Mental Status: She is alert and oriented to person, place, and time.  Psychiatric:        Mood and Affect: Mood is anxious. Affect  is tearful.        Behavior: Behavior normal. Behavior is cooperative.        Assessment/Plan: 1. COPD with hypoxia ([place]) On continuous oxygen, easily SOB when doing minimal activity or just normal ADLs. She takes spiriva daily, dulera 200-5 mcg twice daily, and had a 9-day prednisone taper about 1 month ago. She is also on roflumilast 250 mcg daily and montelukast 10 mg daily. She continues to smoke less than 1 ppd of cigarettes but her husband reports that she is cutting back and a detailed discussion about smoking cessation occurred during today's office visit as well, see problem #6 below. She is motivated to quit but is seeking assistance. She had a PFT in January this year and the results were consistent with severe obstructive lung disease. Due to the severity of her COPD and need for continuous supplemental oxygen at 4LPM, a follow up with Dr. Devona Konig next week is highly recommended for further evaluation and management of her current respiratory status.   2. SOB (shortness of breath) on exertion See problem #1 and #3.   3. Activity intolerance Unable to tolerate some normal ADLs without her oxygen and sometimes even with the oxygen on. This may be strictly related to her respiratory status. Her cardiac function may also be a problem. She has mentioned some fleeting chest pains previously which may be a product of her elevated anxiety levels but could also be related to the heart. Will discuss repeating an EKG at her next office visit in 1 month after working on getting her anxiety under control. Echocardiogram has been ordered and will be scheduled after patient sees Dr. Devona Konig next week.   4. Erythrocytosis Patient had significantly elevated RBC, hemoglobin and hematocrit on her previous CBC, repeat CBC. If levels remain significantly elevated, will refer to hematology. Iron panel also ordered to determine if abnormal iron or ferritin levels are a contributing issue. - CBC with Differential/Platelet - Iron, TIBC and Ferritin Panel  5.  Hypernatremia Patient had slight hypernatremia on her last metabolic panel. Repeat lab ordered.  - Basic Metabolic Panel (BMET)  6. Tobacco abuse Start chantix according to instructions on the prescription. Patient has taken this medication before but is motivated to quit smoking this time. Disadvantages and possible complications of continued tobacco use was discussed in detail with the patient and her husband. Possible benefits of quitting smoking were also discussed in detail.  - varenicline (CHANTIX PAK) 0.5 MG X 11 & 1 MG X 42 tablet; Take one 0.5 mg tablet by mouth once daily for 3 days, then increase to one 0.5 mg tablet twice daily for 4 days, then increase to one 1 mg tablet twice daily.  Dispense: 53 tablet; Refill: 0  7. Generalized anxiety disorder Discussed her increased anxiety and stress as well as her alprazolam prescription in detail. On days where she is feeling increasingly anxious and stressed first thing in the morning, patient will take 2 of the 0.25 mg alprazolam tablets.   8. Need for vaccination - Zoster Vaccine Adjuvanted [facility]) injection; Inject 0.5 mLs into the muscle once for 1 dose.  Dispense: 1 each; Refill: 0 - Tdap (BOOSTRIX) 5-2.5-18.5 LF-MCG/0.5 injection; Inject 0.5 mLs into the muscle once for 1 dose.  Dispense: 0.5 mL; Refill: 0 - pneumococcal 20-valent conjugate vaccine (PREVNAR 20) 0.5 ML injection; Inject 0.5 mLs into the muscle tomorrow at 10 am for 1 dose.  Dispense: 0.5 mL; Refill: 0   General Counseling: Neoma Laming  verbalizes understanding of the findings of todays visit and agrees with plan of treatment. I have discussed any further diagnostic evaluation that may be needed or ordered today. We also reviewed her medications today. she has been encouraged to call the office with any questions or concerns that should arise related to todays visit.    Orders Placed This Encounter  Procedures   CBC with Differential/Platelet   Basic Metabolic Panel  (BMET)   Iron, TIBC and Ferritin Panel   ECHOCARDIOGRAM COMPLETE    Meds ordered this encounter  Medications   Zoster Vaccine Adjuvanted [facility]) injection    Sig: Inject 0.5 mLs into the muscle once for 1 dose.    Dispense:  1 each    Refill:  0   Tdap (BOOSTRIX) 5-2.5-18.5 LF-MCG/0.5 injection    Sig: Inject 0.5 mLs into the muscle once for 1 dose.    Dispense:  0.5 mL    Refill:  0   pneumococcal 20-valent conjugate vaccine (PREVNAR 20) 0.5 ML injection    Sig: Inject 0.5 mLs into the muscle tomorrow at 10 am for 1 dose.    Dispense:  0.5 mL    Refill:  0   varenicline (CHANTIX PAK) 0.5 MG X 11 & 1 MG X 42 tablet    Sig: Take one 0.5 mg tablet by mouth once daily for 3 days, then increase to one 0.5 mg tablet twice daily for 4 days, then increase to one 1 mg tablet twice daily.    Dispense:  53 tablet    Refill:  0   Roflumilast (DALIRESP) 250 MCG TABS    Sig: Take 1 tablet by mouth daily.    Dispense:  30 tablet    Refill:  4    Prior auth approved   mometasone-formoterol (DULERA) 200-5 MCG/ACT AERO    Sig: Inhale 1 puff into the lungs 2 (two) times daily.    Dispense:  8.8 g    Refill:  5   tiotropium (SPIRIVA HANDIHALER) 18 MCG inhalation capsule    Sig: INHALE CONTENTS OF 1 CAPSULE ONCE DAILY USING HANDIHALER    Dispense:  30 capsule    Refill:  5    Return in about 1 week (around 01/28/2022) for F/U, pulmonary only need appt with DSK for COPD/hypoxia. and f/u with Imaad Reuss in 1 month. Need EKG at office visit in 1 month, need echo scheduled after pulm appt next week.   This office visit required high complexity medical decision making and critical thinking in collaboration with Dr. Clayborn Bigness, supervising physician.  Total time spent:45 Minutes Time spent includes review of chart, medications, test results, and follow up plan with the patient.    Controlled Substance Database was reviewed by me.  This patient was seen by Jonetta Osgood, FNP-C in collaboration  with Dr. Clayborn Bigness as a part of collaborative care agreement.   Leeah Politano R. Valetta Fuller, MSN, FNP-C Internal medicine

## 2022-01-22 MED ORDER — DULERA 200-5 MCG/ACT IN AERO: 1.0000 | INHALATION_SPRAY | Freq: Two times a day (BID) | RESPIRATORY_TRACT | 5 refills | Status: DC

## 2022-01-22 MED ORDER — SPIRIVA HANDIHALER 18 MCG IN CAPS: ORAL_CAPSULE | RESPIRATORY_TRACT | 5 refills | Status: DC

## 2022-01-29 ENCOUNTER — Encounter: Payer: Self-pay | Admitting: Internal Medicine

## 2022-01-29 ENCOUNTER — Other Ambulatory Visit: Payer: Self-pay

## 2022-01-29 ENCOUNTER — Ambulatory Visit (INDEPENDENT_AMBULATORY_CARE_PROVIDER_SITE_OTHER): Payer: Medicare Other | Admitting: Internal Medicine

## 2022-01-29 VITALS — BP 102/42 | HR 77 | Temp 98.5°F | Resp 16 | Ht 64.0 in | Wt 120.0 lb

## 2022-01-29 DIAGNOSIS — Z720 Tobacco use: Secondary | ICD-10-CM

## 2022-01-29 DIAGNOSIS — R0902 Hypoxemia: Secondary | ICD-10-CM

## 2022-01-29 DIAGNOSIS — J449 Chronic obstructive pulmonary disease, unspecified: Secondary | ICD-10-CM | POA: Diagnosis not present

## 2022-01-29 DIAGNOSIS — J9611 Chronic respiratory failure with hypoxia: Secondary | ICD-10-CM | POA: Diagnosis not present

## 2022-01-29 NOTE — Patient Instructions (Signed)
Chronic Obstructive Pulmonary Disease Chronic obstructive pulmonary disease (COPD) is a long-term (chronic) lung problem. When you have COPD, it is hard for air to get in and out of your lungs. Usually the condition gets worse over time, and your lungs will never return to normal. There are things you can do to keep yourself as healthy as possible. What are the causes? Smoking. This is the most common cause. Certain genes passed from parent to child (inherited). What increases the risk? Being exposed to secondhand smoke from cigarettes, pipes, or cigars. Being exposed to chemicals and other irritants, such as fumes and dust in the work environment. Having chronic lung conditions or infections. What are the signs or symptoms? Shortness of breath, especially during physical activity. A long-term cough with a large amount of thick mucus. Sometimes, the cough may not have any mucus (dry cough). Wheezing. Breathing quickly. Skin that looks gray or blue, especially in the fingers, toes, or lips. Feeling tired (fatigue). Weight loss. Chest tightness. Having infections often. Episodes when breathing symptoms become much worse (exacerbations). At the later stages of this disease, you may have swelling in the ankles, feet, or legs. How is this treated? Taking medicines. Quitting smoking, if you smoke. Rehabilitation. This includes steps to make your body work better. It may involve a team of specialists. Doing exercises. Making changes to your diet. Using oxygen. Lung surgery. Lung transplant. Comfort measures (palliative care). Follow these instructions at home: Medicines Take over-the-counter and prescription medicines only as told by your doctor. Talk to your doctor before taking any cough or allergy medicines. You may need to avoid medicines that cause your lungs to be dry. Lifestyle If you smoke, stop smoking. Smoking makes the problem worse. Do not smoke or use any products that  contain nicotine or tobacco. If you need help quitting, ask your doctor. Avoid being around things that make your breathing worse. This may include smoke, chemicals, and fumes. Stay active, but remember to rest as well. Learn and use tips on how to manage stress and control your breathing. Make sure you get enough sleep. Most adults need at least 7 hours of sleep every night. Eat healthy foods. Eat smaller meals more often. Rest before meals. Controlled breathing Learn and use tips on how to control your breathing as told by your doctor. Try: Breathing in (inhaling) through your nose for 1 second. Then, pucker your lips and breath out (exhale) through your lips for 2 seconds. Putting one hand on your belly (abdomen). Breathe in slowly through your nose for 1 second. Your hand on your belly should move out. Pucker your lips and breathe out slowly through your lips. Your hand on your belly should move in as you breathe out.  Controlled coughing Learn and use controlled coughing to clear mucus from your lungs. Follow these steps: Lean your head a little forward. Breathe in deeply. Try to hold your breath for 3 seconds. Keep your mouth slightly open while coughing 2 times. Spit any mucus out into a tissue. Rest and do the steps again 1 or 2 times as needed. General instructions Make sure you get all the shots (vaccines) that your doctor recommends. Ask your doctor about a flu shot and a pneumonia shot. Use oxygen therapy and pulmonary rehabilitation if told by your doctor. If you need home oxygen therapy, ask your doctor if you should buy a tool to measure your oxygen level (oximeter). Make a COPD action plan with your doctor. This helps you to  know what to do if you feel worse than usual. Manage any other conditions you have as told by your doctor. Avoid going outside when it is very hot, cold, or humid. Avoid people who have a sickness you can catch (contagious). Keep all follow-up  visits. Contact a doctor if: You cough up more mucus than usual. There is a change in the color or thickness of the mucus. It is harder to breathe than usual. Your breathing is faster than usual. You have trouble sleeping. You need to use your medicines more often than usual. You have trouble doing your normal activities such as getting dressed or walking around the house. Get help right away if: You have shortness of breath while resting. You have shortness of breath that stops you from: Being able to talk. Doing normal activities. Your chest hurts for longer than 5 minutes. Your skin color is more blue than usual. Your pulse oximeter shows that you have low oxygen for longer than 5 minutes. You have a fever. You feel too tired to breathe normally. These symptoms may represent a serious problem that is an emergency. Do not wait to see if the symptoms will go away. Get medical help right away. Call your local emergency services (911 in the [place] Do not drive yourself to the hospital. Summary Chronic obstructive pulmonary disease (COPD) is a long-term lung problem. The way your lungs work will never return to normal. Usually the condition gets worse over time. There are things you can do to keep yourself as healthy as possible. Take over-the-counter and prescription medicines only as told by your doctor. If you smoke, stop. Smoking makes the problem worse. This information is not intended to replace advice given to you by your health care provider. Make sure you discuss any questions you have with your health care provider. Document Revised: 09/19/2020 Document Reviewed: 09/19/2020 Elsevier Patient Education  2022 [place] Transplant, Adult A lung transplant is a surgery to replace one or both of your lungs because they are damaged or failing. During this procedure, the lung or lungs are replaced with healthy lungs that are usually taken from a deceased organ donor. This is a  person who has agreed in advance to donate the lungs at the time of death. You may need this surgery if you have a long-term (chronic) lung condition that will be life-threatening within a year or two and no other treatments or medicines have worked. Conditions that may lead to a lung transplant include: Chronic obstructive pulmonary disease (COPD). Cystic fibrosis. Pulmonary hypertension. Pulmonary fibrosis. Sarcoidosis. Tell your health care provider about: Any allergies you have. All medicines you are taking, including vitamins, herbs, eye drops, creams, and over-the-counter medicines. Any problems you or family members have had with anesthetic medicines. Any blood disorders you have. Any surgeries you have had. Any medical conditions you have. Whether you are pregnant or may be pregnant. What are the risks? The main risk of this surgery is rejection. This happens when your body's defense system (immune system) attacks and damages the new lung. You will take medicines to make your immune system less active in order to prevent rejection. These medicines are called anti-rejection medicines. You will need to take anti-rejection medicines for the rest of your life. Other risks of lung transplant surgery include: Bleeding. Infection. A blocked blood vessel that supplies the lung. A blocked airway that supplies the lung. Fluid that builds up in the lung. Allergic reactions to medicines. Problems that are  caused by long-term use of anti-rejection medicines. These include: Infections. Cancer. Diabetes. High blood pressure. Kidney damage. Weakened bones (osteoporosis). What happens before the surgery? Medicines Ask your health care provider about: Changing or stopping your regular medicines. This is especially important if you are taking diabetes medicines or blood thinners. Taking medicines such as aspirin and ibuprofen. These medicines can thin your blood. Do not take these medicines  unless your health care provider tells you to take them. Taking over-the-counter medicines, vitamins, herbs, and supplements. Tests You may have an exam or testing before surgery. This may include: Heart and lung function tests. Blood tests. Imaging tests. Surgery safety Ask your health care provider: How your surgery site will be marked. What steps will be taken to help prevent infection. These steps may include: Removing hair at the surgery site. Washing skin with a germ-killing soap. Taking antibiotic medicine. General instructions Do not use any products that contain nicotine or tobacco for at least 4 weeks before the procedure. These products include cigarettes, chewing tobacco, and vaping devices, such as e-cigarettes. If you need help quitting, ask your health care provider. Stop eating and drinking as soon as you are told to go to the hospital for a possible lung transplant. Plan to have someone take you home from the hospital. What happens during the surgery? An IV will be inserted into one of your veins. Tubes (catheters) will be placed into the blood vessels in your arm or neck to give fluids and nutrition. Blood samples can also be collected through the catheters during and after the procedure. You will be given one or both of the following: A medicine to help you relax (sedative). A medicine to make you fall asleep (general anesthetic). A tube will be placed in your windpipe (trachea) to help you breathe. Tubes will be placed into your bladder and stomach to drain fluids during and after surgery. Your surgeon will make an incision on the affected side of your chest. If you are having a double lung transplant, the incision may go across and underneath your chest. Your surgeon will open your chest. Your surgeon will cut the main airway and blood vessels that supply your affected lung. Your affected lung will be removed from your chest. The donated lung will be placed into  your chest and attached to your main airway and blood vessels. A drain or drains will be placed to help your body get rid of any blood or fluid after surgery. The chest incision will be closed with stitches (sutures) or staples. A bandage (dressing) will be placed over the incision. The procedure may vary among health care providers and hospitals. What happens after the surgery? Your blood pressure, heart rate, breathing rate, and blood oxygen level will be monitored until you leave the hospital. This will be done in the ICU for the first few days after surgery. You will have a breathing tube in your throat attached to a mechanical ventilator until you can breathe on your own. You will be given medicines to: Stop the pain. Prevent rejection. You will be moved to a regular hospital room when you can breathe on your own. You may stay in the hospital for a few weeks. You will have blood tests and breathing tests to make sure that your new lung is working and is not being rejected. You will start pulmonary rehabilitation. This includes exercises, education, and counseling about living with a lung transplant. You will learn about side effects from your anti-rejection medicines  and how to protect yourself from infection. Summary A lung transplant is a surgery to replace one or both of the lungs. You may need this surgery if you have a long-term (chronic) lung condition that will threaten your life and no other treatments have helped. You will receive a healthy lung from a donor who has died. During the procedure, your surgeon will remove your affected lung and attach the transplant lung to the main airway and blood vessels inside your chest. Rejection is the main risk of this procedure. You will need to take anti-rejection medicines for the rest of your life. This information is not intended to replace advice given to you by your health care provider. Make sure you discuss any questions you have with  your health care provider. Document Revised: 12/18/2020 Document Reviewed: 12/18/2020 Elsevier Patient Education  2022 [employer].

## 2022-01-29 NOTE — Progress Notes (Unsigned)
[facility] [place], [place] [address]  Pulmonary Sleep Medicine   Office Visit Note  Patient Name: Rachael Blankenship DOB: February 24, 1956 MRN [id number]  Date of Service: 01/29/2022  Complaints/HPI: SEVERE COPD  ROS  General: (-) fever, (-) chills, (-) night sweats, (-) weakness Skin: (-) rashes, (-) itching,. Eyes: (-) visual changes, (-) redness, (-) itching. Nose and Sinuses: (-) nasal stuffiness or itchiness, (-) postnasal drip, (-) nosebleeds, (-) sinus trouble. Mouth and Throat: (-) sore throat, (-) hoarseness. Neck: (-) swollen glands, (-) enlarged thyroid, (-) neck pain. Respiratory: + cough, (-) bloody sputum, + shortness of breath, - wheezing. Cardiovascular: - ankle swelling, (-) chest pain. Lymphatic: (-) lymph node enlargement. Neurologic: (-) numbness, (-) tingling. Psychiatric: (-) anxiety, (-) depression   Current Medication: Outpatient Encounter Medications as of 01/29/2022  Medication Sig   ALPRAZolam (XANAX) 0.25 MG tablet Take 1 tablet (0.25 mg total) by mouth 3 (three) times daily as needed for anxiety.   ascorbic acid (VITAMIN C) 500 MG tablet Take 500 mg by mouth daily.   aspirin 81 MG tablet Take 81 mg by mouth at bedtime.    butalbital-acetaminophen-caffeine (FIORICET) 50-325-40 MG tablet Take 1 tablet by mouth every 6 (six) hours as needed for headache.   Cholecalciferol 25 MCG (1000 UT) tablet Take 1,000 Units by mouth daily.    Coenzyme Q10 (CO Q-10) 100 MG CAPS Take 200 mg by mouth daily.    DENTA 5000 PLUS 1.1 % CREA dental cream Take 1 application by mouth daily.   escitalopram (LEXAPRO) 20 MG tablet Take 1 tablet (20 mg total) by mouth daily.   furosemide (LASIX) 40 MG tablet TAKE 1 TABLET BY MOUTH DAILY   ipratropium-albuterol (DUONEB) 0.5-2.5 (3) MG/3ML SOLN Use 1 vial via nebulizer every 4 t0 6 hrs   Magnesium 400 MG TABS Take 400 mg by mouth daily.    mometasone (NASONEX) 50 MCG/ACT nasal spray Place 2 sprays into the  nose daily.   mometasone-formoterol (DULERA) 200-5 MCG/ACT AERO Inhale 1 puff into the lungs 2 (two) times daily.   montelukast (SINGULAIR) 10 MG tablet Take 1 tablet (10 mg total) by mouth daily.   omeprazole (PRILOSEC) 40 MG capsule Take 1 capsule (40 mg total) by mouth 2 (two) times daily.   OXYGEN Inhale into the lungs. Oxygen at night, 2 liters   pt uses [facility] for oxygen supplies   Potassium Chloride CR (MICRO-K) 8 MEQ CPCR capsule CR Take 1 capsule (8 mEq total) by mouth daily.   Roflumilast (DALIRESP) 250 MCG TABS Take 1 tablet by mouth daily.   simvastatin (ZOCOR) 20 MG tablet TAKE 1 TABLET(20 MG) BY MOUTH EVERY EVENING FOR CHOLESTEROL   sucralfate (CARAFATE) 1 g tablet TAKE 1 TABLET BY MOUTH FOUR TIMES DAILY AT BEDTIME WITH MEALS   tiotropium (SPIRIVA HANDIHALER) 18 MCG inhalation capsule INHALE CONTENTS OF 1 CAPSULE ONCE DAILY USING HANDIHALER   varenicline (CHANTIX PAK) 0.5 MG X 11 & 1 MG X 42 tablet Take one 0.5 mg tablet by mouth once daily for 3 days, then increase to one 0.5 mg tablet twice daily for 4 days, then increase to one 1 mg tablet twice daily.   vitamin B-12 (CYANOCOBALAMIN) 1000 MCG tablet Take 1,000 mcg by mouth daily.   vitamin E 180 MG (400 UNITS) capsule Take by mouth.   No facility-administered encounter medications on file as of 01/29/2022.    Surgical History: Past Surgical History:  Procedure Laterality Date   ESOPHAGOGASTRODUODENOSCOPY N/A  07/29/2018   Procedure: ESOPHAGOGASTRODUODENOSCOPY (EGD);  Surgeon: Toledo, Benay Pike, MD;  Location: [facility] ENDOSCOPY;  Service: Gastroenterology;  Laterality: N/A;   KIDNEY STONE SURGERY     TONSILLECTOMY      Medical History: Past Medical History:  Diagnosis Date   COPD (chronic obstructive pulmonary disease) (HCC)    Endometriosis    GERD (gastroesophageal reflux disease)    Hyperlipidemia    Kidney stones     Family History: Family History  Problem Relation Age of Onset   Hypertension Mother    Hyperlipidemia  Mother    GER disease Mother    Heart attack Father        38 yrs ago   Hypertension Father    Parkinson's disease Father    GER disease Father     Social History: Social History   Socioeconomic History   Marital status: Married    Spouse name: Not on file   Number of children: Not on file   Years of education: Not on file   Highest education level: Not on file  Occupational History   Not on file  Tobacco Use   Smoking status: Every Day    Packs/day: 1.00    Types: Cigarettes    Start date: 11/09/2017   Smokeless tobacco: Never   Tobacco comments:    1 pack a day   Vaping Use   Vaping Use: Former  Substance and Sexual Activity   Alcohol use: Yes    Comment: ocassionally    Drug use: No   Sexual activity: Not on file  Other Topics Concern   Not on file  Social History Narrative   Not on file   Social Determinants of Health   Financial Resource Strain: Not on file  Food Insecurity: Not on file  Transportation Needs: Not on file  Physical Activity: Not on file  Stress: Not on file  Social Connections: Not on file  Intimate Partner Violence: Not on file    Vital Signs: Blood pressure (!) 102/42, pulse 77, temperature 98.5 F (36.9 C), resp. rate 16, height '5\' 4"'$  (1.626 m), weight 120 lb (54.4 kg), SpO2 (!) 87 %.  Examination: General Appearance: The patient is well-developed, well-nourished, and in no distress. Skin: Gross inspection of skin unremarkable. Head: normocephalic, no gross deformities. Eyes: no gross deformities noted. ENT: ears appear grossly normal no exudates. Neck: Supple. No thyromegaly. No LAD. Respiratory: no rhonchi noted. Cardiovascular: Normal S1 and S2 without murmur or rub. Extremities: No cyanosis. pulses are equal. Neurologic: Alert and oriented. No involuntary movements.  LABS: Recent Results (from the past 2160 hour(s))  Pulmonary function test     Status: None   Collection Time: 12/05/21 12:00 PM  Result Value Ref Range    FEV1     FVC     FEV1/FVC     TLC     DLCO      Radiology: DG Chest 2 View  Result Date: 12/18/2021 CLINICAL DATA:  Cough with wheezing and congestion. History of COPD. EXAM: CHEST - 2 VIEW COMPARISON:  May 21, 2018 FINDINGS: The lungs are mildly hyperinflated. Mild, chronic appearing increased lung markings are seen. There is no evidence of acute infiltrate, pleural effusion or pneumothorax. The heart size and mediastinal contours are within normal limits. A chronic seventh right rib fracture is noted. The visualized skeletal structures are otherwise unremarkable. IMPRESSION: Stable exam, without active cardiopulmonary disease. Electronically Signed   By: Virgina Norfolk M.D.   On: 12/18/2021 19:56  No results found.  No results found.    Assessment and Plan: Patient Active Problem List   Diagnosis Date Noted   COPD with hypoxia ([place]) 01/21/2022   Erythrocytosis 01/21/2022   Tobacco abuse 01/21/2022   History of COVID-19 12/11/2020   Encounter for general adult medical examination with abnormal findings 09/17/2020   Suprapubic abdominal pain 09/17/2020   Encounter for screening mammogram for malignant neoplasm of breast 09/17/2020   Chronic obstructive pulmonary disease ([place]) 05/12/2020   Age-related osteoporosis without current pathological fracture 04/05/2020   Need for vaccination against Streptococcus pneumoniae using pneumococcal conjugate vaccine 13 03/29/2019   Cigarette smoker 11/26/2018   Obstructive chronic bronchitis without exacerbation ([place]) 08/25/2018   Gastroesophageal reflux disease without esophagitis 08/25/2018   Black stool 08/25/2018   Needs flu shot 08/25/2018   Acute respiratory failure with hypoxemia ([place]) 05/21/2018   Screening for osteoporosis 05/13/2018   Acute upper respiratory infection 05/13/2018   Other fatigue 05/13/2018   Generalized anxiety disorder 05/13/2018   Vitamin D deficiency 05/13/2018   Screening for malignant neoplasm of  cervix 05/13/2018   Dysuria 05/13/2018   Chest pain 01/18/2015   Shortness of breath 01/18/2015   Hyperlipemia 03/08/2014   Pulmonary HTN ([place]) 03/08/2014   Reactive airway disease 03/08/2014    1. COPD with hypoxia ([place]) ***  2. Tobacco abuse ***  3. Chronic respiratory failure with hypoxia (HCC) ***   General Counseling: I have discussed the findings of the evaluation and examination with Neoma Laming.  I have also discussed any further diagnostic evaluation thatmay be needed or ordered today. Khushbu verbalizes understanding of the findings of todays visit. We also reviewed her medications today and discussed drug interactions and side effects including but not limited excessive drowsiness and altered mental states. We also discussed that there is always a risk not just to her but also people around her. she has been encouraged to call the office with any questions or concerns that should arise related to todays visit.  No orders of the defined types were placed in this encounter.    Time spent: 1  I have personally obtained a history, examined the patient, evaluated laboratory and imaging results, formulated the assessment and plan and placed orders.    Allyne Gee, MD [facility] Pulmonary and Critical Care Sleep medicine

## 2022-01-31 ENCOUNTER — Telehealth: Payer: Self-pay | Admitting: Student-PharmD

## 2022-01-31 NOTE — Progress Notes (Addendum)
COPD Review Call  ? ?Rachael Blankenship, Rachael Blankenship [id number] ?28 years, Female  DOB: February 11, 1956  M: [phone] ? ?COPD Review (HC) ?Completed by Charlann Lange on 01/31/2022 ? ?Chart Review ?Is the patient enrolled in RPM with Pulse Ox?: No ? ?Eosinophil Count (last): 2 ?on: 10/10/2021 ? ?Last Spirometry Score (FEV1): 0.8 ?on: 12/05/2020 ? ?Last CAT Score: Unknown  ?on: 01/31/2022 ? ?What recent interventions have been made by any provider to improve the patient's conditions in the last 3 months?:  ? ?Office Visit: ?12/17/21 Jonetta Osgood, NP. For COPD. STARTED FIORICET 50-325-40 MG 1  tablet Oral Every 6 hours PRN. ? ?01/21/22 Jonetta Osgood, NP. For COPD with hypoxia. STARTED CHANTIX PAK) 0.5 MG X 11 & 1 MG X 42 Take one 0.5 mg tablet by mouth once daily for 3 days, then increase to one 0.5 mg tablet twice daily for 4 days, then increase to one 1 mg tablet twice daily.  STOPPED Azithromycin, and Prednisone. GIVEN: Zoster 0.5 mLs Intramuscular once. ? ?01/29/22 Allyne Gee, MD For COPD with hypoxia. No medication changes. ? ?Any recent hospitalizations or ED visits since last visit with CPP?: No ? ?Adherence Review ?Adherence rates for STAR metric medications: Simvastatin 20 mg 12/03/21 90 DS ? ?Adherence rates for medications indicated for disease state being reviewed: None. ? ?Does the patient have >5 day gap between last estimated fill dates for any of the above medications?: No ? ?Disease State Questions ?Able to connect with the Patient?: Yes ? ?How often do you use your rescue inhaler Albuterol?: Multiple times per day ? ?Is patient prescribed a maintenance inhaler?: Yes ?How often is patient using their maintenance inhaler?: Twice daily ? ?CAT Assessment: Done ?How often are you coughing?: 3 ? ?How much mucus are you feeling in your chest?: 3 ? ?How much chest tightness are you having?: 1 ? ?How is your breath when you walk up a hill or flight of stairs?: 2 ? ?How are your limitations to doing activities at  home?: 3 ? ?How would you rate your confidence in leaving your home due to your lung condition?: 1 ? ?How are you sleeping?: 0 (I sleep soundly) ? ?How is your energy level?: 4 ? ?Total CAT Score: 17 ? ?Misc. Response/Information:: Patient stated she is on 4L Oxygen and uses it all the time. She stated she choice not to go through with the PAP forms sent to her for her inhalers. ? ?Engagement Notes ?Charlann Lange on 01/31/2022 03:28 PM ?[facility] Chart Review: 10 min 01/31/22 ?HC Assessment call time spent: 10 min 01/31/22 ? ? ?Charlann Lange, RMA ?Healthcare Concierge  ?[phone] ? ?Pharmacist Review ?Adherence gaps identified?: No ?Drug Therapy Problems identified?: No ?Assessment: Controlled ? ?Alena Bills ?Clinical Pharmacist ? ?

## 2022-02-04 ENCOUNTER — Telehealth: Payer: Self-pay

## 2022-02-04 DIAGNOSIS — F411 Generalized anxiety disorder: Secondary | ICD-10-CM

## 2022-02-05 MED ORDER — ALPRAZOLAM 0.25 MG PO TABS: 0.2500 mg | ORAL_TABLET | Freq: Three times a day (TID) | ORAL | 0 refills | Status: DC | PRN

## 2022-02-06 NOTE — Telephone Encounter (Signed)
Spoke to pt, she is aware 

## 2022-02-22 ENCOUNTER — Other Ambulatory Visit: Payer: Self-pay | Admitting: Internal Medicine

## 2022-02-22 DIAGNOSIS — F411 Generalized anxiety disorder: Secondary | ICD-10-CM

## 2022-02-26 ENCOUNTER — Ambulatory Visit: Payer: Medicare Other | Admitting: Internal Medicine

## 2022-03-05 ENCOUNTER — Telehealth: Payer: Self-pay

## 2022-03-07 ENCOUNTER — Other Ambulatory Visit: Payer: Self-pay | Admitting: Nurse Practitioner

## 2022-03-07 DIAGNOSIS — F411 Generalized anxiety disorder: Secondary | ICD-10-CM

## 2022-03-07 MED ORDER — ALPRAZOLAM 0.25 MG PO TABS: 0.2500 mg | ORAL_TABLET | Freq: Three times a day (TID) | ORAL | 1 refills | Status: DC | PRN

## 2022-03-11 NOTE — Telephone Encounter (Signed)
Refill was sent to pharmacy.

## 2022-03-14 ENCOUNTER — Ambulatory Visit: Payer: Medicare Other | Admitting: Nurse Practitioner

## 2022-03-25 ENCOUNTER — Other Ambulatory Visit: Payer: Self-pay | Admitting: Internal Medicine

## 2022-03-25 DIAGNOSIS — K219 Gastro-esophageal reflux disease without esophagitis: Secondary | ICD-10-CM

## 2022-04-01 ENCOUNTER — Other Ambulatory Visit: Payer: Self-pay | Admitting: Internal Medicine

## 2022-04-11 ENCOUNTER — Other Ambulatory Visit: Payer: Self-pay | Admitting: Nurse Practitioner

## 2022-04-11 DIAGNOSIS — J449 Chronic obstructive pulmonary disease, unspecified: Secondary | ICD-10-CM

## 2022-05-08 ENCOUNTER — Telehealth: Payer: Medicare Other

## 2022-05-23 ENCOUNTER — Other Ambulatory Visit: Payer: Self-pay | Admitting: Nurse Practitioner

## 2022-05-23 DIAGNOSIS — F411 Generalized anxiety disorder: Secondary | ICD-10-CM

## 2022-05-27 ENCOUNTER — Other Ambulatory Visit: Payer: Self-pay | Admitting: Nurse Practitioner

## 2022-05-27 DIAGNOSIS — E782 Mixed hyperlipidemia: Secondary | ICD-10-CM

## 2022-06-20 ENCOUNTER — Other Ambulatory Visit: Payer: Self-pay | Admitting: Internal Medicine

## 2022-06-20 DIAGNOSIS — J4489 Other specified chronic obstructive pulmonary disease: Secondary | ICD-10-CM

## 2022-06-20 DIAGNOSIS — K219 Gastro-esophageal reflux disease without esophagitis: Secondary | ICD-10-CM

## 2022-06-20 DIAGNOSIS — J449 Chronic obstructive pulmonary disease, unspecified: Secondary | ICD-10-CM

## 2022-06-21 ENCOUNTER — Telehealth: Payer: Self-pay

## 2022-06-21 ENCOUNTER — Other Ambulatory Visit: Payer: Self-pay | Admitting: Nurse Practitioner

## 2022-06-21 NOTE — Telephone Encounter (Signed)
Pt need appt for refills  ?

## 2022-06-24 ENCOUNTER — Ambulatory Visit (INDEPENDENT_AMBULATORY_CARE_PROVIDER_SITE_OTHER): Payer: Medicare Other | Admitting: Physician Assistant

## 2022-06-24 ENCOUNTER — Encounter: Payer: Self-pay | Admitting: Physician Assistant

## 2022-06-24 DIAGNOSIS — J449 Chronic obstructive pulmonary disease, unspecified: Secondary | ICD-10-CM

## 2022-06-24 DIAGNOSIS — R0902 Hypoxemia: Secondary | ICD-10-CM | POA: Diagnosis not present

## 2022-06-24 DIAGNOSIS — F411 Generalized anxiety disorder: Secondary | ICD-10-CM

## 2022-06-24 MED ORDER — ALPRAZOLAM 0.25 MG PO TABS: 0.2500 mg | ORAL_TABLET | Freq: Three times a day (TID) | ORAL | 2 refills | Status: DC | PRN

## 2022-06-24 NOTE — Progress Notes (Signed)
[facility] [place], [place] [address]  Internal MEDICINE  Office Visit Note  Patient Name: Rachael Blankenship  [id number]  [id number]  Date of Service: 06/24/2022  Chief Complaint  Patient presents with   Follow-up   Gastroesophageal Reflux   Hyperlipidemia   COPD   Medication Refill   Quality Metric Gaps    Shingles, Pneumonia vaccines    HPI Pt is here for routine follow up for med refills -Wearing 3-4L oxygen continuously  -Out of xanax, takes 2-3 times per day and needs this refilled today. Usually takes 1 in AM and 1 before bed, but occassionally will need one midday. -Has 54year old mother living with her and can be stressful -She is due for vaccines and is planning on having these done at pharmacy when she can. May see if they can do any today when she picks up her med refills. -She has her CPE scheduled already in 3 months with Alyssa, NP  Current Medication: Outpatient Encounter Medications as of 06/24/2022  Medication Sig   ascorbic acid (VITAMIN C) 500 MG tablet Take 500 mg by mouth daily.   aspirin 81 MG tablet Take 81 mg by mouth at bedtime.    butalbital-acetaminophen-caffeine (FIORICET) 50-325-40 MG tablet Take 1 tablet by mouth every 6 (six) hours as needed for headache.   Cholecalciferol 25 MCG (1000 UT) tablet Take 1,000 Units by mouth daily.    Coenzyme Q10 (CO Q-10) 100 MG CAPS Take 200 mg by mouth daily.    DENTA 5000 PLUS 1.1 % CREA dental cream Take 1 application by mouth daily.   DULERA 200-5 MCG/ACT AERO Inhale 1 puff into the lungs 2 (two) times daily.   escitalopram (LEXAPRO) 20 MG tablet Take 1 tablet (20 mg total) by mouth daily.   furosemide (LASIX) 40 MG tablet TAKE 1 TABLET( 40 MG TOTAL) BY MOUTH DAILY   ipratropium-albuterol (DUONEB) 0.5-2.5 (3) MG/3ML SOLN Use 1 vial via nebulizer every 4 t0 6 hrs   Magnesium 400 MG TABS Take 400 mg by mouth daily.    mometasone (NASONEX) 50 MCG/ACT nasal spray Place 2 sprays into the  nose daily.   montelukast (SINGULAIR) 10 MG tablet Take 1 tablet (10 mg total) by mouth daily.   omeprazole (PRILOSEC) 40 MG capsule Take 1 capsule (40 mg total) by mouth 2 (two) times daily.   OXYGEN Inhale into the lungs. Oxygen at night, 2 liters   pt uses AHP for oxygen supplies   Potassium Chloride CR (MICRO-K) 8 MEQ CPCR capsule CR Take 1 capsule (8 mEq total) by mouth daily.   Roflumilast (DALIRESP) 250 MCG TABS Take 1 tablet by mouth daily.   simvastatin (ZOCOR) 20 MG tablet TAKE 1 TABLET(20 MG) BY MOUTH EVERY EVENING FOR CHOLESTEROL   sucralfate (CARAFATE) 1 g tablet TAKE 1 TABLET BY MOUTH FOUR TIMES DAILY AT BEDTIME WITH MEALS   tiotropium (SPIRIVA HANDIHALER) 18 MCG inhalation capsule INHALE CONTENTS OF 1 CAPSULE ONCE DAILY USING HANDIHALER   varenicline (CHANTIX PAK) 0.5 MG X 11 & 1 MG X 42 tablet Take one 0.5 mg tablet by mouth once daily for 3 days, then increase to one 0.5 mg tablet twice daily for 4 days, then increase to one 1 mg tablet twice daily.   vitamin B-12 (CYANOCOBALAMIN) 1000 MCG tablet Take 1,000 mcg by mouth daily.   vitamin E 180 MG (400 UNITS) capsule Take by mouth.   [DISCONTINUED] ALPRAZolam (XANAX) 0.25 MG tablet Take 1 tablet (0.25  mg total) by mouth 3 (three) times daily as needed for anxiety.   ALPRAZolam (XANAX) 0.25 MG tablet Take 1 tablet (0.25 mg total) by mouth 3 (three) times daily as needed for anxiety.   No facility-administered encounter medications on file as of 06/24/2022.    Surgical History: Past Surgical History:  Procedure Laterality Date   ESOPHAGOGASTRODUODENOSCOPY N/A 07/29/2018   Procedure: ESOPHAGOGASTRODUODENOSCOPY (EGD);  Surgeon: Toledo, Benay Pike, MD;  Location: [facility] ENDOSCOPY;  Service: Gastroenterology;  Laterality: N/A;   KIDNEY STONE SURGERY     TONSILLECTOMY      Medical History: Past Medical History:  Diagnosis Date   COPD (chronic obstructive pulmonary disease) (HCC)    Endometriosis    GERD (gastroesophageal reflux  disease)    Hyperlipidemia    Kidney stones     Family History: Family History  Problem Relation Age of Onset   Hypertension Mother    Hyperlipidemia Mother    GER disease Mother    Heart attack Father        105 yrs ago   Hypertension Father    Parkinson's disease Father    GER disease Father     Social History   Socioeconomic History   Marital status: Married    Spouse name: Not on file   Number of children: Not on file   Years of education: Not on file   Highest education level: Not on file  Occupational History   Not on file  Tobacco Use   Smoking status: Every Day    Packs/day: 1.00    Types: Cigarettes    Start date: 11/09/2017   Smokeless tobacco: Never   Tobacco comments:    1 pack a day   Vaping Use   Vaping Use: Former  Substance and Sexual Activity   Alcohol use: Yes    Comment: ocassionally    Drug use: No   Sexual activity: Not on file  Other Topics Concern   Not on file  Social History Narrative   Not on file   Social Determinants of Health   Financial Resource Strain: Not on file  Food Insecurity: Not on file  Transportation Needs: Not on file  Physical Activity: Not on file  Stress: Not on file  Social Connections: Not on file  Intimate Partner Violence: Not on file      Review of Systems  Constitutional:  Negative for chills, fatigue and unexpected weight change.  HENT:  Negative for congestion, postnasal drip, rhinorrhea, sneezing and sore throat.   Eyes:  Negative for redness.  Respiratory:  Positive for shortness of breath. Negative for cough, chest tightness and wheezing.   Cardiovascular:  Negative for chest pain and palpitations.  Gastrointestinal:  Negative for abdominal pain, constipation, diarrhea, nausea and vomiting.  Genitourinary:  Negative for dysuria and frequency.  Musculoskeletal:  Negative for arthralgias, back pain, joint swelling and neck pain.  Skin:  Negative for rash.  Neurological: Negative.  Negative for  tremors and numbness.  Hematological:  Negative for adenopathy. Does not bruise/bleed easily.  Psychiatric/Behavioral:  Positive for sleep disturbance. Negative for behavioral problems (Depression) and suicidal ideas. The patient is nervous/anxious.     Vital Signs: BP 125/72   Pulse 85   Temp (!) 78 F (25.6 C)   Resp 16   Ht '5\' 4"'$  (1.626 m)   Wt 133 lb 12.8 oz (60.7 kg)   SpO2 94%   BMI 22.97 kg/m    Physical Exam Vitals and nursing note reviewed.  Constitutional:      General: She is not in acute distress.    Appearance: Normal appearance. She is normal weight. She is not ill-appearing.  HENT:     Head: Normocephalic and atraumatic.  Eyes:     Pupils: Pupils are equal, round, and reactive to light.  Cardiovascular:     Rate and Rhythm: Normal rate and regular rhythm.  Pulmonary:     Effort: Pulmonary effort is normal. No respiratory distress.     Comments: On portable oxygen Skin:    General: Skin is warm and dry.  Neurological:     Mental Status: She is alert and oriented to person, place, and time.     Cranial Nerves: No cranial nerve deficit.     Coordination: Coordination normal.     Gait: Gait normal.  Psychiatric:        Mood and Affect: Mood normal.        Behavior: Behavior normal.        Assessment/Plan: 1. Generalized anxiety disorder May continue xanax as needed - ALPRAZolam (XANAX) 0.25 MG tablet; Take 1 tablet (0.25 mg total) by mouth 3 (three) times daily as needed for anxiety.  Dispense: 90 tablet; Refill: 2 [place] Controlled Substance Database was reviewed by me for overdose risk score (ORS)  2. COPD with hypoxia ([place]) Continue inhalers as prescribed as well as oxygen   General Counseling: Rachael Blankenship verbalizes understanding of the findings of todays visit and agrees with plan of treatment. I have discussed any further diagnostic evaluation that may be needed or ordered today. We also reviewed her medications today. she has been encouraged to call  the office with any questions or concerns that should arise related to todays visit.    No orders of the defined types were placed in this encounter.   Meds ordered this encounter  Medications   ALPRAZolam (XANAX) 0.25 MG tablet    Sig: Take 1 tablet (0.25 mg total) by mouth 3 (three) times daily as needed for anxiety.    Dispense:  90 tablet    Refill:  2    Please discontinue previous orders of alprazolam, # of tablets changed to 90    This patient was seen by Drema Dallas, PA-C in collaboration with Dr. Clayborn Bigness as a part of collaborative care agreement.   Total time spent:30 Minutes Time spent includes review of chart, medications, test results, and follow up plan with the patient.      Dr Lavera Guise Internal medicine

## 2022-06-27 ENCOUNTER — Other Ambulatory Visit: Payer: Self-pay | Admitting: Nurse Practitioner

## 2022-07-10 ENCOUNTER — Other Ambulatory Visit: Payer: Self-pay | Admitting: Nurse Practitioner

## 2022-07-10 DIAGNOSIS — J449 Chronic obstructive pulmonary disease, unspecified: Secondary | ICD-10-CM

## 2022-07-16 DIAGNOSIS — Z23 Encounter for immunization: Secondary | ICD-10-CM | POA: Diagnosis not present

## 2022-07-17 ENCOUNTER — Other Ambulatory Visit: Payer: Self-pay | Admitting: Nurse Practitioner

## 2022-07-17 DIAGNOSIS — J449 Chronic obstructive pulmonary disease, unspecified: Secondary | ICD-10-CM

## 2022-07-18 DIAGNOSIS — D509 Iron deficiency anemia, unspecified: Secondary | ICD-10-CM | POA: Diagnosis not present

## 2022-07-18 DIAGNOSIS — D751 Secondary polycythemia: Secondary | ICD-10-CM | POA: Diagnosis not present

## 2022-07-18 DIAGNOSIS — E871 Hypo-osmolality and hyponatremia: Secondary | ICD-10-CM | POA: Diagnosis not present

## 2022-07-19 LAB — CBC WITH DIFFERENTIAL/PLATELET
Basophils Absolute: 0 10*3/uL (ref 0.0–0.2)
Basos: 1 %
EOS (ABSOLUTE): 0.2 10*3/uL (ref 0.0–0.4)
Eos: 2 %
Hematocrit: 39.6 % (ref 34.0–46.6)
Hemoglobin: 13.1 g/dL (ref 11.1–15.9)
Immature Grans (Abs): 0 10*3/uL (ref 0.0–0.1)
Immature Granulocytes: 1 %
Lymphocytes Absolute: 2.6 10*3/uL (ref 0.7–3.1)
Lymphs: 34 %
MCH: 28.2 pg (ref 26.6–33.0)
MCHC: 33.1 g/dL (ref 31.5–35.7)
MCV: 85 fL (ref 79–97)
Monocytes Absolute: 0.5 10*3/uL (ref 0.1–0.9)
Monocytes: 6 %
Neutrophils Absolute: 4.4 10*3/uL (ref 1.4–7.0)
Neutrophils: 56 %
Platelets: 339 10*3/uL (ref 150–450)
RBC: 4.64 x10E6/uL (ref 3.77–5.28)
RDW: 13.1 % (ref 11.7–15.4)
WBC: 7.8 10*3/uL (ref 3.4–10.8)

## 2022-07-19 LAB — IRON,TIBC AND FERRITIN PANEL
Ferritin: 74 ng/mL (ref 15–150)
Iron Saturation: 23 % (ref 15–55)
Iron: 79 ug/dL (ref 27–139)
Total Iron Binding Capacity: 349 ug/dL (ref 250–450)
UIBC: 270 ug/dL (ref 118–369)

## 2022-07-19 LAB — BASIC METABOLIC PANEL
BUN/Creatinine Ratio: 24 (ref 12–28)
BUN: 12 mg/dL (ref 8–27)
CO2: 31 mmol/L — ABNORMAL HIGH (ref 20–29)
Calcium: 9.4 mg/dL (ref 8.7–10.3)
Chloride: 97 mmol/L (ref 96–106)
Creatinine, Ser: 0.5 mg/dL — ABNORMAL LOW (ref 0.57–1.00)
Glucose: 98 mg/dL (ref 70–99)
Potassium: 4.5 mmol/L (ref 3.5–5.2)
Sodium: 141 mmol/L (ref 134–144)
eGFR: 102 mL/min/{1.73_m2} (ref >59)

## 2022-07-30 ENCOUNTER — Encounter: Payer: Self-pay | Admitting: Internal Medicine

## 2022-07-30 ENCOUNTER — Ambulatory Visit (INDEPENDENT_AMBULATORY_CARE_PROVIDER_SITE_OTHER): Payer: Medicare Other | Admitting: Internal Medicine

## 2022-07-30 VITALS — BP 98/72 | HR 79 | Temp 98.5°F | Resp 16 | Ht 64.0 in | Wt 135.0 lb

## 2022-07-30 DIAGNOSIS — J449 Chronic obstructive pulmonary disease, unspecified: Secondary | ICD-10-CM | POA: Diagnosis not present

## 2022-07-30 DIAGNOSIS — R0602 Shortness of breath: Secondary | ICD-10-CM

## 2022-07-30 DIAGNOSIS — J9611 Chronic respiratory failure with hypoxia: Secondary | ICD-10-CM | POA: Diagnosis not present

## 2022-07-30 DIAGNOSIS — Z720 Tobacco use: Secondary | ICD-10-CM | POA: Diagnosis not present

## 2022-07-30 NOTE — Progress Notes (Signed)
[facility] [place], [place] [address]  Pulmonary Sleep Medicine   Office Visit Note  Patient Name: Rachael Blankenship DOB: [birth year] MRN [id number]  Date of Service: 07/30/2022  Complaints/HPI: She had given up smoking in April however states she found cigarettes around the house and started smoking again. 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 She has been taking xanax but not consistently. She feels the anxiety is contributing to her smoking again. She is on oxygen and states she has been using it. She states she has SOB with exertion. She has no chest pain  ROS  General: (-) fever, (-) chills, (-) night sweats, (-) weakness Skin: (-) rashes, (-) itching,. Eyes: (-) visual changes, (-) redness, (-) itching. Nose and Sinuses: (-) nasal stuffiness or itchiness, (-) postnasal drip, (-) nosebleeds, (-) sinus trouble. Mouth and Throat: (-) sore throat, (-) hoarseness. Neck: (-) swollen glands, (-) enlarged thyroid, (-) neck pain. Respiratory: - cough, (-) bloody sputum, + shortness of breath, - wheezing. Cardiovascular: + ankle swelling, (-) chest pain. Lymphatic: (-) lymph node enlargement. Neurologic: (-) numbness, (-) tingling. Psychiatric: (-) anxiety, (-) depression   Current Medication: Outpatient Encounter Medications as of 07/30/2022  Medication Sig   ALPRAZolam (XANAX) 0.25 MG tablet Take 1 tablet (0.25 mg total) by mouth 3 (three) times daily as needed for anxiety.   ascorbic acid (VITAMIN C) 500 MG tablet Take 500 mg by mouth daily.   aspirin 81 MG tablet Take 81 mg by mouth at bedtime.    butalbital-acetaminophen-caffeine (FIORICET) 50-325-40 MG tablet Take 1 tablet by mouth every 6 (six) hours as needed for headache.   Cholecalciferol 25 MCG (1000 UT) tablet Take 1,000 Units by mouth daily.    Coenzyme Q10 (CO Q-10) 100 MG CAPS Take 200 mg by mouth daily.    DALIRESP 250 MCG TABS Take 1 tablet by mouth  daily.   DENTA 5000 PLUS 1.1 % CREA dental cream Take 1 application by mouth daily.   DULERA 200-5 MCG/ACT AERO Inhale 1 puff into the lungs 2 (two) times daily.   escitalopram (LEXAPRO) 20 MG tablet Take 1 tablet (20 mg total) by mouth daily.   furosemide (LASIX) 40 MG tablet TAKE 1 TABLET( 40 MG TOTAL) BY MOUTH DAILY   ipratropium-albuterol (DUONEB) 0.5-2.5 (3) MG/3ML SOLN Use 1 vial via nebulizer every 4 t0 6 hrs   Magnesium 400 MG TABS Take 400 mg by mouth daily.    mometasone (NASONEX) 50 MCG/ACT nasal spray Place 2 sprays into the nose daily.   montelukast (SINGULAIR) 10 MG tablet Take 1 tablet (10 mg total) by mouth daily.   omeprazole (PRILOSEC) 40 MG capsule Take 1 capsule (40 mg total) by mouth 2 (two) times daily.   OXYGEN Inhale into the lungs. Oxygen at night, 2 liters   pt uses AHP for oxygen supplies   Potassium Chloride CR (MICRO-K) 8 MEQ CPCR capsule CR Take 1 capsule (8 mEq total) by mouth daily.   simvastatin (ZOCOR) 20 MG tablet TAKE 1 TABLET(20 MG) BY MOUTH EVERY EVENING FOR CHOLESTEROL   sucralfate (CARAFATE) 1 g tablet TAKE 1 TABLET BY MOUTH FOUR TIMES DAILY AT BEDTIME WITH MEALS   tiotropium (SPIRIVA HANDIHALER) 18 MCG inhalation capsule INHALE CONTENTS OF 1 CAPSULE ONCE DAILY USING HANDIHALER   varenicline (CHANTIX PAK) 0.5 MG X 11 & 1 MG X 42 tablet Take one 0.5 mg tablet by mouth once daily for  3 days, then increase to one 0.5 mg tablet twice daily for 4 days, then increase to one 1 mg tablet twice daily.   vitamin B-12 (CYANOCOBALAMIN) 1000 MCG tablet Take 1,000 mcg by mouth daily.   vitamin E 180 MG (400 UNITS) capsule Take by mouth.   No facility-administered encounter medications on file as of 07/30/2022.    Surgical History: Past Surgical History:  Procedure Laterality Date   ESOPHAGOGASTRODUODENOSCOPY N/A 07/29/2018   Procedure: ESOPHAGOGASTRODUODENOSCOPY (EGD);  Surgeon: Toledo, Benay Pike, MD;  Location: [facility] ENDOSCOPY;  Service: Gastroenterology;  Laterality:  N/A;   KIDNEY STONE SURGERY     TONSILLECTOMY      Medical History: Past Medical History:  Diagnosis Date   COPD (chronic obstructive pulmonary disease) (HCC)    Endometriosis    GERD (gastroesophageal reflux disease)    Hyperlipidemia    Kidney stones     Family History: Family History  Problem Relation Age of Onset   Hypertension Mother    Hyperlipidemia Mother    GER disease Mother    Heart attack Father        39 yrs ago   Hypertension Father    Parkinson's disease Father    GER disease Father     Social History: Social History   Socioeconomic History   Marital status: Married    Spouse name: Not on file   Number of children: Not on file   Years of education: Not on file   Highest education level: Not on file  Occupational History   Not on file  Tobacco Use   Smoking status: Former    Packs/day: 1.00    Types: Cigarettes    Start date: 11/09/2017    Quit date: 02/2022    Years since quitting: 0.4   Smokeless tobacco: Never   Tobacco comments:    1 pack a day   Vaping Use   Vaping Use: Former  Substance and Sexual Activity   Alcohol use: Yes    Comment: ocassionally    Drug use: No   Sexual activity: Not on file  Other Topics Concern   Not on file  Social History Narrative   Not on file   Social Determinants of Health   Financial Resource Strain: Not on file  Food Insecurity: Not on file  Transportation Needs: Not on file  Physical Activity: Not on file  Stress: Not on file  Social Connections: Not on file  Intimate Partner Violence: Not on file    Vital Signs: Blood pressure 98/72, pulse 79, temperature 98.5 F (36.9 C), resp. rate 16, height _0  (1.626 m), weight 135 lb (61.2 kg), SpO2 97 %.  Examination: General Appearance: The patient is well-developed, well-nourished, and in no distress. Skin: Gross inspection of skin unremarkable. Head: normocephalic, no gross deformities. Eyes: no gross deformities noted. ENT: ears appear  grossly normal no exudates. Neck: Supple. No thyromegaly. No LAD. Respiratory: distant rhonchi noted. Cardiovascular: Normal S1 and S2 without murmur or rub. Extremities: No cyanosis. pulses are equal. Neurologic: Alert and oriented. No involuntary movements.  LABS: Recent Results (from the past 2160 hour(s))  CBC with Differential/Platelet     Status: None   Collection Time: 07/18/22  2:24 PM  Result Value Ref Range   WBC 7.8 3.4 - 10.8 x10E3/uL   RBC 4.64 3.77 - 5.28 x10E6/uL   Hemoglobin 13.1 11.1 - 15.9 g/dL   Hematocrit 39.6 34.0 - 46.6 %   MCV 85 79 - 97 fL  MCH 28.2 26.6 - 33.0 pg   MCHC 33.1 31.5 - 35.7 g/dL   RDW 13.1 11.7 - 15.4 %   Platelets 339 150 - 450 x10E3/uL   Neutrophils 56 Not Estab. %   Lymphs 34 Not Estab. %   Monocytes 6 Not Estab. %   Eos 2 Not Estab. %   Basos 1 Not Estab. %   Neutrophils Absolute 4.4 1.4 - 7.0 x10E3/uL   Lymphocytes Absolute 2.6 0.7 - 3.1 x10E3/uL   Monocytes Absolute 0.5 0.1 - 0.9 x10E3/uL   EOS (ABSOLUTE) 0.2 0.0 - 0.4 x10E3/uL   Basophils Absolute 0.0 0.0 - 0.2 x10E3/uL   Immature Granulocytes 1 Not Estab. %   Immature Grans (Abs) 0.0 0.0 - 0.1 G64Q0/HK  Basic Metabolic Panel (BMET)     Status: Abnormal   Collection Time: 07/18/22  2:24 PM  Result Value Ref Range   Glucose 98 70 - 99 mg/dL   BUN 12 8 - 27 mg/dL   Creatinine, Ser 0.50 (L) 0.57 - 1.00 mg/dL   eGFR 102 >59 mL/min/1.73   BUN/Creatinine Ratio 24 12 - 28   Sodium 141 134 - 144 mmol/L   Potassium 4.5 3.5 - 5.2 mmol/L   Chloride 97 96 - 106 mmol/L   CO2 31 (H) 20 - 29 mmol/L   Calcium 9.4 8.7 - 10.3 mg/dL  Iron, TIBC and Ferritin Panel     Status: None   Collection Time: 07/18/22  2:25 PM  Result Value Ref Range   Total Iron Binding Capacity 349 250 - 450 ug/dL   UIBC 270 118 - 369 ug/dL   Iron 79 27 - 139 ug/dL   Iron Saturation 23 15 - 55 %   Ferritin 74 15 - 150 ng/mL    Radiology: DG Chest 2 View  Result Date: 12/18/2021 CLINICAL DATA:  Cough with  wheezing and congestion. History of COPD. EXAM: CHEST - 2 VIEW COMPARISON:  May 21, 2018 FINDINGS: The lungs are mildly hyperinflated. Mild, chronic appearing increased lung markings are seen. There is no evidence of acute infiltrate, pleural effusion or pneumothorax. The heart size and mediastinal contours are within normal limits. A chronic seventh right rib fracture is noted. The visualized skeletal structures are otherwise unremarkable. IMPRESSION: Stable exam, without active cardiopulmonary disease. Electronically Signed   By: Virgina Norfolk M.D.   On: 12/18/2021 19:56    No results found.  No results found.    Assessment and Plan: Patient Active Problem List   Diagnosis Date Noted   COPD with hypoxia ([place]) 01/21/2022   Erythrocytosis 01/21/2022   Tobacco abuse 01/21/2022   History of COVID-19 12/11/2020   Encounter for general adult medical examination with abnormal findings 09/17/2020   Suprapubic abdominal pain 09/17/2020   Encounter for screening mammogram for malignant neoplasm of breast 09/17/2020   Chronic obstructive pulmonary disease ([place]) 05/12/2020   Age-related osteoporosis without current pathological fracture 04/05/2020   Need for vaccination against Streptococcus pneumoniae using pneumococcal conjugate vaccine 13 03/29/2019   Cigarette smoker 11/26/2018   Obstructive chronic bronchitis without exacerbation ([place]) 08/25/2018   Gastroesophageal reflux disease without esophagitis 08/25/2018   Black stool 08/25/2018   Needs flu shot 08/25/2018   Acute respiratory failure with hypoxemia ([place]) 05/21/2018   Screening for osteoporosis 05/13/2018   Acute upper respiratory infection 05/13/2018   Other fatigue 05/13/2018   Generalized anxiety disorder 05/13/2018   Vitamin D deficiency 05/13/2018   Screening for malignant neoplasm of cervix 05/13/2018   Dysuria 05/13/2018  Chest pain 01/18/2015   Shortness of breath 01/18/2015   Hyperlipemia 03/08/2014   Pulmonary  HTN ([place]) 03/08/2014   Reactive airway disease 03/08/2014    1. SOB (shortness of breath) on exertion Patient has still with progressive severe shortness of breath she fortunately has had longstanding history of smoking which has caused significant pulmonary damage.  I do not believe that she even would be considered as a candidate for lung transplantation which we have discussed in the past due to the fact that she has still had issues with ongoing cigarette smoking. - Spirometry with Graph  2. Chronic respiratory failure with hypoxia (HCC) She needs to be on oxygen and continue with recommended therapy.  3. Tobacco abuse Smoking cessation counseling again very strongly urged she has not been compliant with this recommendation.  4. Obstructive chronic bronchitis without exacerbation ([place]) Patient has severe advanced COPD she has not been compliant with recommended therapy currently she is on DuoNebs albuterol tiotropium and Dulera which will be continued.  General Counseling: I have discussed the findings of the evaluation and examination with Neoma Laming.  I have also discussed any further diagnostic evaluation thatmay be needed or ordered today. Demika verbalizes understanding of the findings of todays visit. We also reviewed her medications today and discussed drug interactions and side effects including but not limited excessive drowsiness and altered mental states. We also discussed that there is always a risk not just to her but also people around her. she has been encouraged to call the office with any questions or concerns that should arise related to todays visit.  Orders Placed This Encounter  Procedures   Spirometry with Graph    Order Specific Question:   Where should this test be performed?    Answer:   [facility]     Time spent: 28  I have personally obtained a history, examined the patient, evaluated laboratory and imaging results, formulated the assessment and  plan and placed orders.    Allyne Gee, MD [facility] Pulmonary and Critical Care Sleep medicine

## 2022-07-30 NOTE — Patient Instructions (Signed)
Chronic Obstructive Pulmonary Disease  Chronic obstructive pulmonary disease (COPD) is a long-term (chronic) lung problem. When you have COPD, it is hard for air to get in and out of your lungs. Usually the condition gets worse over time, and your lungs will never return to normal. There are things you can do to keep yourself as healthy as possible. What are the causes? Smoking. This is the most common cause. Certain genes passed from parent to child (inherited). What increases the risk? Being exposed to secondhand smoke from cigarettes, pipes, or cigars. Being exposed to chemicals and other irritants, such as fumes and dust in the work environment. Having chronic lung conditions or infections. What are the signs or symptoms? Shortness of breath, especially during physical activity. A long-term cough with a large amount of thick mucus. Sometimes, the cough may not have any mucus (dry cough). Wheezing. Breathing quickly. Skin that looks gray or blue, especially in the fingers, toes, or lips. Feeling tired (fatigue). Weight loss. Chest tightness. Having infections often. Episodes when breathing symptoms become much worse (exacerbations). At the later stages of this disease, you may have swelling in the ankles, feet, or legs. How is this treated? Taking medicines. Quitting smoking, if you smoke. Rehabilitation. This includes steps to make your body work better. It may involve a team of specialists. Doing exercises. Making changes to your diet. Using oxygen. Lung surgery. Lung transplant. Comfort measures (palliative care). Follow these instructions at home: Medicines Take over-the-counter and prescription medicines only as told by your doctor. Talk to your doctor before taking any cough or allergy medicines. You may need to avoid medicines that cause your lungs to be dry. Lifestyle If you smoke, stop smoking. Smoking makes the problem worse. Do not smoke or use any products that  contain nicotine or tobacco. If you need help quitting, ask your doctor. Avoid being around things that make your breathing worse. This may include smoke, chemicals, and fumes. Stay active, but remember to rest as well. Learn and use tips on how to manage stress and control your breathing. Make sure you get enough sleep. Most adults need at least 7 hours of sleep every night. Eat healthy foods. Eat smaller meals more often. Rest before meals. Controlled breathing Learn and use tips on how to control your breathing as told by your doctor. Try: Breathing in (inhaling) through your nose for 1 second. Then, pucker your lips and breath out (exhale) through your lips for 2 seconds. Putting one hand on your belly (abdomen). Breathe in slowly through your nose for 1 second. Your hand on your belly should move out. Pucker your lips and breathe out slowly through your lips. Your hand on your belly should move in as you breathe out.  Controlled coughing Learn and use controlled coughing to clear mucus from your lungs. Follow these steps: Lean your head a little forward. Breathe in deeply. Try to hold your breath for 3 seconds. Keep your mouth slightly open while coughing 2 times. Spit any mucus out into a tissue. Rest and do the steps again 1 or 2 times as needed. General instructions Make sure you get all the shots (vaccines) that your doctor recommends. Ask your doctor about a flu shot and a pneumonia shot. Use oxygen therapy and pulmonary rehabilitation if told by your doctor. If you need home oxygen therapy, ask your doctor if you should buy a tool to measure your oxygen level (oximeter). Make a COPD action plan with your doctor. This helps you   to know what to do if you feel worse than usual. Manage any other conditions you have as told by your doctor. Avoid going outside when it is very hot, cold, or humid. Avoid people who have a sickness you can catch (contagious). Keep all follow-up  visits. Contact a doctor if: You cough up more mucus than usual. There is a change in the color or thickness of the mucus. It is harder to breathe than usual. Your breathing is faster than usual. You have trouble sleeping. You need to use your medicines more often than usual. You have trouble doing your normal activities such as getting dressed or walking around the house. Get help right away if: You have shortness of breath while resting. You have shortness of breath that stops you from: Being able to talk. Doing normal activities. Your chest hurts for longer than 5 minutes. Your skin color is more blue than usual. Your pulse oximeter shows that you have low oxygen for longer than 5 minutes. You have a fever. You feel too tired to breathe normally. These symptoms may represent a serious problem that is an emergency. Do not wait to see if the symptoms will go away. Get medical help right away. Call your local emergency services (911 in the [place] Do not drive yourself to the hospital. Summary Chronic obstructive pulmonary disease (COPD) is a long-term lung problem. The way your lungs work will never return to normal. Usually the condition gets worse over time. There are things you can do to keep yourself as healthy as possible. Take over-the-counter and prescription medicines only as told by your doctor. If you smoke, stop. Smoking makes the problem worse. This information is not intended to replace advice given to you by your health care provider. Make sure you discuss any questions you have with your health care provider. Document Revised: 09/19/2020 Document Reviewed: 09/19/2020 Elsevier Patient Education  2023 Elsevier Inc.  

## 2022-08-14 ENCOUNTER — Other Ambulatory Visit: Payer: Self-pay | Admitting: Nurse Practitioner

## 2022-08-14 DIAGNOSIS — J449 Chronic obstructive pulmonary disease, unspecified: Secondary | ICD-10-CM

## 2022-08-26 ENCOUNTER — Other Ambulatory Visit: Payer: Self-pay | Admitting: Nurse Practitioner

## 2022-08-26 DIAGNOSIS — E782 Mixed hyperlipidemia: Secondary | ICD-10-CM

## 2022-08-30 ENCOUNTER — Other Ambulatory Visit: Payer: Self-pay | Admitting: Nurse Practitioner

## 2022-09-05 DIAGNOSIS — Z23 Encounter for immunization: Secondary | ICD-10-CM | POA: Diagnosis not present

## 2022-09-09 DIAGNOSIS — Z86006 Personal history of melanoma in-situ: Secondary | ICD-10-CM | POA: Diagnosis not present

## 2022-09-09 DIAGNOSIS — D2261 Melanocytic nevi of right upper limb, including shoulder: Secondary | ICD-10-CM | POA: Diagnosis not present

## 2022-09-09 DIAGNOSIS — Z85820 Personal history of malignant melanoma of skin: Secondary | ICD-10-CM | POA: Diagnosis not present

## 2022-09-09 DIAGNOSIS — L821 Other seborrheic keratosis: Secondary | ICD-10-CM | POA: Diagnosis not present

## 2022-09-09 DIAGNOSIS — Z85828 Personal history of other malignant neoplasm of skin: Secondary | ICD-10-CM | POA: Diagnosis not present

## 2022-09-09 DIAGNOSIS — D485 Neoplasm of uncertain behavior of skin: Secondary | ICD-10-CM | POA: Diagnosis not present

## 2022-09-09 DIAGNOSIS — B078 Other viral warts: Secondary | ICD-10-CM | POA: Diagnosis not present

## 2022-09-16 ENCOUNTER — Other Ambulatory Visit: Payer: Self-pay | Admitting: Nurse Practitioner

## 2022-09-16 DIAGNOSIS — K219 Gastro-esophageal reflux disease without esophagitis: Secondary | ICD-10-CM

## 2022-09-17 ENCOUNTER — Other Ambulatory Visit: Payer: Self-pay | Admitting: Nurse Practitioner

## 2022-09-17 MED ORDER — ALPRAZOLAM 0.25 MG PO TABS: 0.2500 mg | ORAL_TABLET | Freq: Three times a day (TID) | ORAL | 0 refills | Status: DC | PRN

## 2022-09-19 ENCOUNTER — Ambulatory Visit: Payer: Medicare Other | Admitting: Nurse Practitioner

## 2022-09-23 ENCOUNTER — Other Ambulatory Visit: Payer: Self-pay | Admitting: Nurse Practitioner

## 2022-10-01 ENCOUNTER — Encounter: Payer: Self-pay | Admitting: Nurse Practitioner

## 2022-10-01 ENCOUNTER — Ambulatory Visit (INDEPENDENT_AMBULATORY_CARE_PROVIDER_SITE_OTHER): Payer: Medicare Other | Admitting: Nurse Practitioner

## 2022-10-01 VITALS — BP 128/63 | HR 90 | Temp 98.3°F | Resp 16 | Ht 64.0 in | Wt 138.4 lb

## 2022-10-01 DIAGNOSIS — F418 Other specified anxiety disorders: Secondary | ICD-10-CM | POA: Diagnosis not present

## 2022-10-01 DIAGNOSIS — J0110 Acute frontal sinusitis, unspecified: Secondary | ICD-10-CM | POA: Diagnosis not present

## 2022-10-01 DIAGNOSIS — Z0001 Encounter for general adult medical examination with abnormal findings: Secondary | ICD-10-CM | POA: Diagnosis not present

## 2022-10-01 DIAGNOSIS — J449 Chronic obstructive pulmonary disease, unspecified: Secondary | ICD-10-CM | POA: Diagnosis not present

## 2022-10-01 DIAGNOSIS — J9611 Chronic respiratory failure with hypoxia: Secondary | ICD-10-CM

## 2022-10-01 DIAGNOSIS — F411 Generalized anxiety disorder: Secondary | ICD-10-CM | POA: Diagnosis not present

## 2022-10-01 MED ORDER — ESCITALOPRAM OXALATE 20 MG PO TABS: 10.0000 mg | ORAL_TABLET | Freq: Every day | ORAL | Status: DC

## 2022-10-01 MED ORDER — ALPRAZOLAM 0.25 MG PO TABS: 0.2500 mg | ORAL_TABLET | Freq: Three times a day (TID) | ORAL | 2 refills | Status: DC | PRN

## 2022-10-01 MED ORDER — HYDROCOD POLI-CHLORPHE POLI ER 10-8 MG/5ML PO SUER: 5.0000 mL | Freq: Two times a day (BID) | ORAL | 0 refills | Status: DC | PRN

## 2022-10-01 MED ORDER — DULOXETINE HCL 30 MG PO CPEP: 30.0000 mg | ORAL_CAPSULE | Freq: Every day | ORAL | 3 refills | Status: DC

## 2022-10-01 MED ORDER — AMOXICILLIN-POT CLAVULANATE 875-125 MG PO TABS: 1.0000 | ORAL_TABLET | Freq: Two times a day (BID) | ORAL | 0 refills | Status: DC

## 2022-10-01 NOTE — Progress Notes (Signed)
[facility] [place], [place] [address]  Internal MEDICINE  Office Visit Note  Patient Name: Rachael Blankenship  [id number]  [id number]  Date of Service: 10/01/2022  Chief Complaint  Patient presents with   Medicare Wellness   Hyperlipidemia   Gastroesophageal Reflux    HPI Venesha presents for an annual well visit and physical exam.  Well-appearing 66 y.o. female with pulmonary hypertension, COPD with hypoxia, GERD, osteoporosis, GAD, depression, and high cholesterol.  Routine CRC screening: deferred per patient but overdue for cologuard or colonoscopy  Routine mammogram: last mammogram was 2019 -- overdue for repeat DEXA scan: overdue for repeat DEXA scan Labs: routine labs were done in August and previously discussed New or worsening pain: typical osteoarthritis Other concerns: Cough, runny nose, watery eye, catching something from husband,  --also feeling kind of down and depressed stemming from her current medical condition and breathing      10/01/2022    2:01 PM 08/29/2020    3:29 PM 03/29/2019    2:02 PM  MMSE - Mini Mental State Exam  Orientation to time '5 5 5  '$ Orientation to Place '5 5 5  '$ Registration '3 3 3  '$ Attention/ Calculation '5 5 5  '$ Recall '3 3 3  '$ Language- name 2 objects '2 2 2  '$ Language- repeat '1 1 1  '$ Language- follow 3 step command '3 3 3  '$ Language- read & follow direction '1 1 1  '$ Write a sentence '1 1 1  '$ Copy design '1 1 1  '$ Total score '30 30 30    '$ Functional Status Survey: Is the patient deaf or have difficulty hearing?: Yes Does the patient have difficulty seeing, even when wearing glasses/contacts?: Yes Does the patient have difficulty concentrating, remembering, or making decisions?: Yes Does the patient have difficulty walking or climbing stairs?: Yes Does the patient have difficulty dressing or bathing?: Yes Does the patient have difficulty doing errands alone such as visiting a doctor's office or shopping?: Yes      05/16/2021   11:29 AM 09/13/2021    2:18 PM 12/17/2021    3:38 PM 06/24/2022    1:21 PM 10/01/2022    2:00 PM  [place] in the past year? 0 0 0 0 0  Was there an injury with Fall?     0  Fall Risk Category Calculator     0  Fall Risk Category     Low  Patient Fall Risk Level   Low fall risk  Low fall risk  Patient at Risk for Falls Due to   No Fall Risks  No Fall Risks  Fall risk Follow up   Falls evaluation completed  Falls evaluation completed       11/18/2022    7:15 AM  Depression screen PHQ 2/9  Decreased Interest 2  Down, Depressed, Hopeless 2  PHQ - 2 Score 4  Altered sleeping 2  Tired, decreased energy 3  Change in appetite 2  Feeling bad or failure about yourself  2  Trouble concentrating 1  Moving slowly or fidgety/restless 1  Suicidal thoughts 1  PHQ-9 Score 16  Difficult doing work/chores Somewhat difficult        Current Medication: Outpatient Encounter Medications as of 10/01/2022  Medication Sig   amoxicillin-clavulanate (AUGMENTIN) 875-125 MG tablet Take 1 tablet by mouth 2 (two) times daily. Take with food   ascorbic acid (VITAMIN C) 500 MG tablet Take 500 mg by mouth daily.   aspirin 81 MG  tablet Take 81 mg by mouth at bedtime.    butalbital-acetaminophen-caffeine (FIORICET) 50-325-40 MG tablet Take 1 tablet by mouth every 6 (six) hours as needed for headache.   chlorpheniramine-HYDROcodone (TUSSIONEX) 10-8 MG/5ML Take 5 mLs by mouth every 12 (twelve) hours as needed for cough.   Cholecalciferol 25 MCG (1000 UT) tablet Take 1,000 Units by mouth daily.    Coenzyme Q10 (CO Q-10) 100 MG CAPS Take 200 mg by mouth daily.    DALIRESP 250 MCG TABS Take 1 tablet by mouth daily.   DENTA 5000 PLUS 1.1 % CREA dental cream Take 1 application by mouth daily.   DULoxetine (CYMBALTA) 30 MG capsule Take 1 capsule (30 mg total) by mouth daily.   furosemide (LASIX) 40 MG tablet TAKE 1 TABLET( 40 MG TOTAL) BY MOUTH DAILY   ipratropium-albuterol (DUONEB) 0.5-2.5  (3) MG/3ML SOLN Use 1 vial via nebulizer every 4 t0 6 hrs   Magnesium 400 MG TABS Take 400 mg by mouth daily.    mometasone (NASONEX) 50 MCG/ACT nasal spray Place 2 sprays into the nose daily.   omeprazole (PRILOSEC) 40 MG capsule Take 1 capsule (40 mg total) by mouth 2 (two) times daily.   OXYGEN Inhale into the lungs. Oxygen at night, 2 liters   pt uses [facility] for oxygen supplies   Potassium Chloride CR (MICRO-K) 8 MEQ CPCR capsule CR Take 1 capsule (8 mEq total) by mouth daily.   simvastatin (ZOCOR) 20 MG tablet TAKE 1 TABLET(20 MG) BY MOUTH EVERY EVENING FOR CHOLESTEROL   sucralfate (CARAFATE) 1 g tablet TAKE 1 TABLET BY MOUTH FOUR TIMES DAILY AT BEDTIME WITH MEALS   varenicline (CHANTIX PAK) 0.5 MG X 11 & 1 MG X 42 tablet Take one 0.5 mg tablet by mouth once daily for 3 days, then increase to one 0.5 mg tablet twice daily for 4 days, then increase to one 1 mg tablet twice daily.   vitamin B-12 (CYANOCOBALAMIN) 1000 MCG tablet Take 1,000 mcg by mouth daily.   vitamin E 180 MG (400 UNITS) capsule Take by mouth.   [DISCONTINUED] ALPRAZolam (XANAX) 0.25 MG tablet Take 1 tablet (0.25 mg total) by mouth 3 (three) times daily as needed for anxiety.   [DISCONTINUED] ALPRAZolam (XANAX) 0.25 MG tablet Take 1 tablet (0.25 mg total) by mouth 3 (three) times daily as needed for anxiety or sleep.   [DISCONTINUED] DULERA 200-5 MCG/ACT AERO Inhale 1 puff into the lungs 2 (two) times daily.   [DISCONTINUED] escitalopram (LEXAPRO) 20 MG tablet Take 1 tablet (20 mg total) by mouth daily.   [DISCONTINUED] montelukast (SINGULAIR) 10 MG tablet Take 1 tablet (10 mg total) by mouth daily.   [DISCONTINUED] revefenacin (YUPELRI) 175 MCG/3ML nebulizer solution Take 3 mLs (175 mcg total) by nebulization daily.   [DISCONTINUED] tiotropium (SPIRIVA HANDIHALER) 18 MCG inhalation capsule INHALE CONTENTS OF 1 CAPSULE ONCE DAILY USING HANDIHALER   ALPRAZolam (XANAX) 0.25 MG tablet Take 1 tablet (0.25 mg total) by mouth 3 (three)  times daily as needed for anxiety.   [DISCONTINUED] escitalopram (LEXAPRO) 20 MG tablet Take 0.5 tablets (10 mg total) by mouth daily. For 7 days then take every other day for 1 week, then stop.   No facility-administered encounter medications on file as of 10/01/2022.    Surgical History: Past Surgical History:  Procedure Laterality Date   ESOPHAGOGASTRODUODENOSCOPY N/A 07/29/2018   Procedure: ESOPHAGOGASTRODUODENOSCOPY (EGD);  Surgeon: Toledo, Benay Pike, MD;  Location: [facility] ENDOSCOPY;  Service: Gastroenterology;  Laterality: N/A;   KIDNEY STONE  SURGERY     TONSILLECTOMY      Medical History: Past Medical History:  Diagnosis Date   COPD (chronic obstructive pulmonary disease) (HCC)    Endometriosis    GERD (gastroesophageal reflux disease)    Hyperlipidemia    Kidney stones     Family History: Family History  Problem Relation Age of Onset   Hypertension Mother    Hyperlipidemia Mother    GER disease Mother    Heart attack Father        66 yrs ago   Hypertension Father    Parkinson's disease Father    GER disease Father     Social History   Socioeconomic History   Marital status: Married    Spouse name: Not on file   Number of children: Not on file   Years of education: Not on file   Highest education level: Not on file  Occupational History   Not on file  Tobacco Use   Smoking status: Former    Packs/day: 1.00    Types: Cigarettes    Start date: 11/09/2017    Quit date: 02/2022    Years since quitting: 0.7   Smokeless tobacco: Never   Tobacco comments:    1 pack a day   Vaping Use   Vaping Use: Former  Substance and Sexual Activity   Alcohol use: Yes    Comment: ocassionally    Drug use: No   Sexual activity: Not on file  Other Topics Concern   Not on file  Social History Narrative   Not on file   Social Determinants of Health   Financial Resource Strain: Not on file  Food Insecurity: Not on file  Transportation Needs: Not on file  Physical  Activity: Not on file  Stress: Not on file  Social Connections: Not on file  Intimate Partner Violence: Not on file      Review of Systems  Constitutional:  Positive for activity change, appetite change and fatigue. Negative for diaphoresis and fever.  HENT:  Positive for congestion, postnasal drip, rhinorrhea, sinus pressure, sinus pain, sneezing and sore throat.   Eyes:  Positive for discharge.  Respiratory:  Positive for cough, chest tightness, shortness of breath and wheezing.   Cardiovascular: Negative.  Negative for chest pain and palpitations.  Gastrointestinal: Negative.  Negative for abdominal pain, constipation, diarrhea, nausea and vomiting.  Musculoskeletal:  Positive for arthralgias.  Skin: Negative.   Neurological:  Positive for weakness and headaches.  Psychiatric/Behavioral:  Positive for behavioral problems, decreased concentration, dysphoric mood and sleep disturbance. Negative for self-injury and suicidal ideas. The patient is nervous/anxious.     Vital Signs: BP 128/63   Pulse 90   Temp 98.3 F (36.8 C)   Resp 16   Ht '5\' 4"'$  (1.626 m)   Wt 138 lb 6.4 oz (62.8 kg)   SpO2 91% Comment: 3l  BMI 23.76 kg/m    Physical Exam Vitals reviewed.  Constitutional:      General: She is awake. She is not in acute distress.    Appearance: She is well-developed, well-groomed and normal weight. She is ill-appearing.  HENT:     Head: Normocephalic and atraumatic.     Right Ear: Tympanic membrane, ear canal and external ear normal.     Left Ear: Tympanic membrane, ear canal and external ear normal.     Nose: Mucosal edema, congestion and rhinorrhea present.     Right Turbinates: Swollen and pale.     Left Turbinates:  Swollen and pale.     Right Sinus: Frontal sinus tenderness present.     Left Sinus: Frontal sinus tenderness present.     Mouth/Throat:     Lips: Pink.     Mouth: Mucous membranes are moist.     Pharynx: Uvula midline. Posterior oropharyngeal erythema  present.  Eyes:     General: Lids are normal. Vision grossly intact. Gaze aligned appropriately.     Extraocular Movements: Extraocular movements intact.     Pupils: Pupils are equal, round, and reactive to light.  Neck:     Thyroid: No thyromegaly.     Trachea: Trachea and phonation normal.  Cardiovascular:     Rate and Rhythm: Normal rate and regular rhythm.     Pulses:          Carotid pulses are 2+ on the right side and 2+ on the left side.      Radial pulses are 2+ on the right side and 2+ on the left side.     Heart sounds: S1 normal and S2 normal.  Pulmonary:     Effort: Tachypnea and accessory muscle usage present. No respiratory distress.     Breath sounds: Normal air entry. No decreased air movement. Examination of the right-upper field reveals wheezing. Examination of the left-upper field reveals wheezing. Examination of the right-middle field reveals wheezing. Examination of the left-middle field reveals wheezing. Examination of the right-lower field reveals decreased breath sounds. Examination of the left-lower field reveals decreased breath sounds. Decreased breath sounds and wheezing present.  Chest:     Comments: Declined breast exam, overdue for routine mammogram Abdominal:     General: Bowel sounds are normal. There is no distension.     Palpations: Abdomen is soft. There is no shifting dullness, fluid wave, hepatomegaly, splenomegaly, mass or pulsatile mass.     Tenderness: There is no abdominal tenderness.  Musculoskeletal:     Cervical back: Normal range of motion and neck supple.     Right lower leg: No edema.     Left lower leg: No edema.  Neurological:     Mental Status: She is alert.  Psychiatric:        Mood and Affect: Mood is anxious and depressed. Affect is tearful (sad affect).        Speech: Speech normal.        Behavior: Behavior is slowed. Behavior is cooperative.        Thought Content: Thought content is not paranoid. Thought content does not  include homicidal or suicidal ideation.        Cognition and Memory: Memory normal.        Assessment/Plan: 1. Encounter for general adult medical examination with abnormal findings Age-appropriate preventive screenings and vaccinations discussed, annual physical exam completed. Routine labs for health maintenance drawn in August and previously discussed. PHM updated. Multiple care gaps identified. Overdue for repeat mammogram, DEXA scan and colonoscopy or cologuard and will readdress once her breathing problems are more stabilized.   2. Acute non-recurrent frontal sinusitis Empiric antibiotic treatment prescribed and cough suppressant for symptomatic relief. - chlorpheniramine-HYDROcodone (TUSSIONEX) 10-8 MG/5ML; Take 5 mLs by mouth every 12 (twelve) hours as needed for cough.  Dispense: 140 mL; Refill: 0 - amoxicillin-clavulanate (AUGMENTIN) 875-125 MG tablet; Take 1 tablet by mouth 2 (two) times daily. Take with food  Dispense: 20 tablet; Refill: 0  3. Chronic respiratory failure with hypoxia (HCC) New portable oxygen reordered. May also benefit from noninvasive ventilation - For home  use only DME oxygen  4. COPD with hypoxia ([place]) New portable oxygen reordered - For home use only DME oxygen  5. Generalized anxiety disorder Escitalopram discontinued, start duloxetine as prescribed. Continue alprazolam prn as prescribed - ALPRAZolam (XANAX) 0.25 MG tablet; Take 1 tablet (0.25 mg total) by mouth 3 (three) times daily as needed for anxiety.  Dispense: 90 tablet; Refill: 2 - DULoxetine (CYMBALTA) 30 MG capsule; Take 1 capsule (30 mg total) by mouth daily.  Dispense: 30 capsule; Refill: 3  6. Depression with anxiety Start duloxetine as prescribed. Stop escitalopram.  - DULoxetine (CYMBALTA) 30 MG capsule; Take 1 capsule (30 mg total) by mouth daily.  Dispense: 30 capsule; Refill: 3      General Counseling: Jasha verbalizes understanding of the findings of todays visit and agrees  with plan of treatment. I have discussed any further diagnostic evaluation that may be needed or ordered today. We also reviewed her medications today. she has been encouraged to call the office with any questions or concerns that should arise related to todays visit.    Orders Placed This Encounter  Procedures   For home use only DME oxygen    Meds ordered this encounter  Medications   ALPRAZolam (XANAX) 0.25 MG tablet    Sig: Take 1 tablet (0.25 mg total) by mouth 3 (three) times daily as needed for anxiety.    Dispense:  90 tablet    Refill:  2    Please discontinue previous orders of alprazolam, # of tablets changed to 90   DULoxetine (CYMBALTA) 30 MG capsule    Sig: Take 1 capsule (30 mg total) by mouth daily.    Dispense:  30 capsule    Refill:  3   chlorpheniramine-HYDROcodone (TUSSIONEX) 10-8 MG/5ML    Sig: Take 5 mLs by mouth every 12 (twelve) hours as needed for cough.    Dispense:  140 mL    Refill:  0   amoxicillin-clavulanate (AUGMENTIN) 875-125 MG tablet    Sig: Take 1 tablet by mouth 2 (two) times daily. Take with food    Dispense:  20 tablet    Refill:  0   DISCONTD: escitalopram (LEXAPRO) 20 MG tablet    Sig: Take 0.5 tablets (10 mg total) by mouth daily. For 7 days then take every other day for 1 week, then stop.   DISCONTD: revefenacin (YUPELRI) 175 MCG/3ML nebulizer solution    Sig: Take 3 mLs (175 mcg total) by nebulization daily.    Dispense:  90 mL    Refill:  11    Return in about 1 month (around 10/31/2022) for F/U, eval new med, Bernedette Auston PCP.   Total time spent:30 Minutes Time spent includes review of chart, medications, test results, and follow up plan with the patient.   [place] Controlled Substance Database was reviewed by me.  This patient was seen by Jonetta Osgood, FNP-C in collaboration with Dr. Clayborn Bigness as a part of collaborative care agreement.  Rihan Schueler R. Valetta Fuller, MSN, FNP-C Internal medicine

## 2022-10-02 ENCOUNTER — Telehealth: Payer: Self-pay

## 2022-10-02 NOTE — Telephone Encounter (Signed)
Send to [facility]  for oxygen order

## 2022-10-08 ENCOUNTER — Telehealth: Payer: Self-pay

## 2022-10-09 MED ORDER — REVEFENACIN 175 MCG/3ML IN SOLN: 175.0000 ug | Freq: Every day | RESPIRATORY_TRACT | 11 refills | Status: DC

## 2022-10-09 NOTE — Telephone Encounter (Signed)
Gave pres to [employer]

## 2022-10-10 ENCOUNTER — Other Ambulatory Visit: Payer: Self-pay

## 2022-10-10 MED ORDER — REVEFENACIN 175 MCG/3ML IN SOLN: 175.0000 ug | Freq: Every day | RESPIRATORY_TRACT | 11 refills | Status: DC

## 2022-10-23 ENCOUNTER — Other Ambulatory Visit: Payer: Self-pay

## 2022-10-23 ENCOUNTER — Telehealth: Payer: Self-pay | Admitting: Nurse Practitioner

## 2022-10-23 DIAGNOSIS — J4489 Other specified chronic obstructive pulmonary disease: Secondary | ICD-10-CM

## 2022-10-23 MED ORDER — DULERA 200-5 MCG/ACT IN AERO: 1.0000 | INHALATION_SPRAY | Freq: Two times a day (BID) | RESPIRATORY_TRACT | 3 refills | Status: DC

## 2022-10-23 NOTE — Telephone Encounter (Signed)
Patient called stating Rachael Blankenship does not have dulera and not sure when they can get. Patient will call [facility] to see if they have and call office back-Toni

## 2022-10-31 ENCOUNTER — Ambulatory Visit (INDEPENDENT_AMBULATORY_CARE_PROVIDER_SITE_OTHER): Payer: Medicare Other | Admitting: Nurse Practitioner

## 2022-10-31 ENCOUNTER — Other Ambulatory Visit: Payer: Self-pay | Admitting: Internal Medicine

## 2022-10-31 ENCOUNTER — Encounter: Payer: Self-pay | Admitting: Nurse Practitioner

## 2022-10-31 VITALS — BP 110/68 | HR 98 | Temp 97.1°F | Resp 16 | Ht 63.0 in | Wt 135.2 lb

## 2022-10-31 DIAGNOSIS — R1902 Left upper quadrant abdominal swelling, mass and lump: Secondary | ICD-10-CM

## 2022-10-31 DIAGNOSIS — J4489 Other specified chronic obstructive pulmonary disease: Secondary | ICD-10-CM

## 2022-10-31 DIAGNOSIS — F418 Other specified anxiety disorders: Secondary | ICD-10-CM | POA: Diagnosis not present

## 2022-10-31 DIAGNOSIS — J449 Chronic obstructive pulmonary disease, unspecified: Secondary | ICD-10-CM | POA: Diagnosis not present

## 2022-10-31 DIAGNOSIS — J9611 Chronic respiratory failure with hypoxia: Secondary | ICD-10-CM | POA: Diagnosis not present

## 2022-10-31 DIAGNOSIS — R109 Unspecified abdominal pain: Secondary | ICD-10-CM

## 2022-10-31 NOTE — Progress Notes (Signed)
[name], [name]  Internal MEDICINE  Office Visit Note  Patient Name: Rachael Blankenship  [id number]  [id number]  Date of Service: 10/31/2022  Chief Complaint  Patient presents with   Follow-up    Eval new med   Gastroesophageal Reflux   Hyperlipidemia    HPI Rachael Blankenship presents for a follow up visit for severe COPD, depression and abdominal pain and tenderness Severe COPD-- has been using yupelri once daily since last visit and feels like it is helping. Patient is not as SOB as she was at previous office visit. Quit smoking 4 months ago now. Still working on getting her portable oxygen concentrator replaced because it is malfunctioning and working on getting her set up with a noninvasive ventilator.  Depression -- lexapro was discontinued and she was started on duloxetine which she reports seems to be working better for her.  Abdominal pain and tenderness of left upper quadrant -- tender to touch, somewhat firm and bulging area. Reports that when she bend over she feels like something is twisting and flipping and she also has some epigastric pain when bending over.      Current Medication: Outpatient Encounter Medications as of 10/31/2022  Medication Sig   ALPRAZolam (XANAX) 0.25 MG tablet Take 1 tablet (0.25 mg total) by mouth 3 (three) times daily as needed for anxiety.   amoxicillin-clavulanate (AUGMENTIN) 875-125 MG tablet Take 1 tablet by mouth 2 (two) times daily. Take with food   ascorbic acid (VITAMIN C) 500 MG tablet Take 500 mg by mouth daily.   aspirin 81 MG tablet Take 81 mg by mouth at bedtime.    butalbital-acetaminophen-caffeine (FIORICET) 50-325-40 MG tablet Take 1 tablet by mouth every 6 (six) hours as needed for headache.   chlorpheniramine-HYDROcodone (TUSSIONEX) 10-8 MG/5ML Take 5 mLs by mouth every 12 (twelve) hours as needed for cough.   Cholecalciferol 25 MCG (1000 UT) tablet Take 1,000 Units by mouth daily.     Coenzyme Q10 (CO Q-10) 100 MG CAPS Take 200 mg by mouth daily.    DALIRESP 250 MCG TABS Take 1 tablet by mouth daily.   DENTA 5000 PLUS 1.1 % CREA dental cream Take 1 application by mouth daily.   DULoxetine (CYMBALTA) 30 MG capsule Take 1 capsule (30 mg total) by mouth daily.   furosemide (LASIX) 40 MG tablet TAKE 1 TABLET( 40 MG TOTAL) BY MOUTH DAILY   ipratropium-albuterol (DUONEB) 0.5-2.5 (3) MG/3ML SOLN Use 1 vial via nebulizer every 4 t0 6 hrs   Magnesium 400 MG TABS Take 400 mg by mouth daily.    mometasone (NASONEX) 50 MCG/ACT nasal spray Place 2 sprays into the nose daily.   mometasone-formoterol (DULERA) 200-5 MCG/ACT AERO Inhale 1 puff into the lungs 2 (two) times daily.   montelukast (SINGULAIR) 10 MG tablet Take 1 tablet (10 mg total) by mouth daily.   omeprazole (PRILOSEC) 40 MG capsule Take 1 capsule (40 mg total) by mouth 2 (two) times daily.   OXYGEN Inhale into the lungs. Oxygen at night, 2 liters   pt uses AHP for oxygen supplies   Potassium Chloride CR (MICRO-K) 8 MEQ CPCR capsule CR Take 1 capsule (8 mEq total) by mouth daily.   revefenacin (YUPELRI) 175 MCG/3ML nebulizer solution Take 3 mLs (175 mcg total) by nebulization daily.   simvastatin (ZOCOR) 20 MG tablet TAKE 1 TABLET(20 MG) BY MOUTH EVERY EVENING FOR CHOLESTEROL   sucralfate (CARAFATE) 1 g tablet TAKE 1 TABLET BY MOUTH  FOUR TIMES DAILY AT BEDTIME WITH MEALS   tiotropium (SPIRIVA HANDIHALER) 18 MCG inhalation capsule INHALE CONTENTS OF 1 CAPSULE ONCE DAILY USING HANDIHALER   varenicline (CHANTIX PAK) 0.5 MG X 11 & 1 MG X 42 tablet Take one 0.5 mg tablet by mouth once daily for 3 days, then increase to one 0.5 mg tablet twice daily for 4 days, then increase to one 1 mg tablet twice daily.   vitamin B-12 (CYANOCOBALAMIN) 1000 MCG tablet Take 1,000 mcg by mouth daily.   vitamin E 180 MG (400 UNITS) capsule Take by mouth.   [DISCONTINUED] escitalopram (LEXAPRO) 20 MG tablet Take 0.5 tablets (10 mg total) by mouth daily.  For 7 days then take every other day for 1 week, then stop.   No facility-administered encounter medications on file as of 10/31/2022.    Surgical History: Past Surgical History:  Procedure Laterality Date   ESOPHAGOGASTRODUODENOSCOPY N/A 07/29/2018   Procedure: ESOPHAGOGASTRODUODENOSCOPY (EGD);  Surgeon: Toledo, Benay Pike, MD;  Location: [facility] ENDOSCOPY;  Service: Gastroenterology;  Laterality: N/A;   KIDNEY STONE SURGERY     TONSILLECTOMY      Medical History: Past Medical History:  Diagnosis Date   COPD (chronic obstructive pulmonary disease) (HCC)    Endometriosis    GERD (gastroesophageal reflux disease)    Hyperlipidemia    Kidney stones     Family History: Family History  Problem Relation Age of Onset   Hypertension Mother    Hyperlipidemia Mother    GER disease Mother    Heart attack Father        15 yrs ago   Hypertension Father    Parkinson's disease Father    GER disease Father     Social History   Socioeconomic History   Marital status: Married    Spouse name: Not on file   Number of children: Not on file   Years of education: Not on file   Highest education level: Not on file  Occupational History   Not on file  Tobacco Use   Smoking status: Former    Packs/day: 1.00    Types: Cigarettes    Start date: 11/09/2017    Quit date: 02/2022    Years since quitting: 0.6   Smokeless tobacco: Never   Tobacco comments:    1 pack a day   Vaping Use   Vaping Use: Former  Substance and Sexual Activity   Alcohol use: Yes    Comment: ocassionally    Drug use: No   Sexual activity: Not on file  Other Topics Concern   Not on file  Social History Narrative   Not on file   Social Determinants of Health   Financial Resource Strain: Not on file  Food Insecurity: Not on file  Transportation Needs: Not on file  Physical Activity: Not on file  Stress: Not on file  Social Connections: Not on file  Intimate Partner Violence: Not on file      Review of  Systems  Constitutional:  Positive for fatigue. Negative for chills and unexpected weight change.  HENT:  Negative for congestion, postnasal drip, rhinorrhea, sneezing and sore throat.   Eyes:  Negative for redness.  Respiratory:  Positive for cough, shortness of breath and wheezing. Negative for chest tightness.   Cardiovascular:  Negative for chest pain and palpitations.  Gastrointestinal:  Positive for abdominal distention and abdominal pain. Negative for constipation, diarrhea, nausea and vomiting.  Genitourinary:  Negative for dysuria and frequency.  Musculoskeletal:  Negative  for arthralgias, back pain, joint swelling and neck pain.  Skin:  Negative for rash.  Neurological: Negative.  Negative for tremors and numbness.  Hematological:  Negative for adenopathy. Does not bruise/bleed easily.  Psychiatric/Behavioral:  Positive for behavioral problems (Depression) and sleep disturbance. Negative for suicidal ideas. The patient is nervous/anxious.     Vital Signs: BP 110/68   Pulse 98 Comment: 117  Temp (!) 97.1 F (36.2 C)   Resp 16   Ht '5\' 3"'$  (1.6 m)   Wt 135 lb 3.2 oz (61.3 kg)   SpO2 93% Comment: 3L  BMI 23.95 kg/m    Physical Exam Vitals reviewed.  Constitutional:      General: She is not in acute distress.    Appearance: Normal appearance. She is normal weight. She is not ill-appearing.  HENT:     Head: Normocephalic and atraumatic.  Eyes:     Pupils: Pupils are equal, round, and reactive to light.  Cardiovascular:     Rate and Rhythm: Normal rate and regular rhythm.  Abdominal:     General: Bowel sounds are normal. There is distension.     Palpations: Abdomen is soft. There is mass (left upper quadrant). There is no shifting dullness, fluid wave or pulsatile mass.     Tenderness: There is abdominal tenderness in the epigastric area and left upper quadrant.     Hernia: A hernia (possible left upper quadrant?) is present.  Neurological:     Mental Status: She is  alert and oriented to person, place, and time.  Psychiatric:        Mood and Affect: Mood normal.        Behavior: Behavior normal.        Assessment/Plan: 1. Chronic respiratory failure with hypoxia (HCC) CT chest ordered for routine surveillance of COPD and respiratory failure. Started requiring oxygen approx 6 months ago.  The patient has severe chronic respiratory failure and COPD. BiPAP has been considered and ruled out as it lacks the ability to adequately manage this patient's respiratory disease. I am ordering NIV therapy because of its AVAPS AE mode and faster responding AVAPS rate. The severity of this patient's disease is such that failure to provide noninvasive ventilator therapy on a daily basis will likely lead to future hospital readmissions and life-threatening situations.  NIV was already ordered and this is the rationale for the order. The patient should receive a phone call soon to schedule set up at her home.  - CT Chest Wo Contrast; Future  2. COPD with hypoxia ([place]) See problem #1. - CT Chest Wo Contrast; Future  3. Localized abdominal pain Abdominal ultrasound ordered for further evaluation. Possible hernia vs abdominal mass vs SBO.  - US Abdomen Complete; Future  4. Abdominal mass, left upper quadrant See problem #3 - US Abdomen Complete; Future  5. Anxiety with depression Continue duloxetine as prescribed    General Counseling: Giulliana verbalizes understanding of the findings of todays visit and agrees with plan of treatment. I have discussed any further diagnostic evaluation that may be needed or ordered today. We also reviewed her medications today. she has been encouraged to call the office with any questions or concerns that should arise related to todays visit.    Orders Placed This Encounter  Procedures   US Abdomen Complete   CT Chest Wo Contrast    No orders of the defined types were placed in this encounter.   Return in about 1 month  (around 12/01/2022) for F/U,  med refill, Cavion Faiola PCP.   Total time spent:30 Minutes Time spent includes review of chart, medications, test results, and follow up plan with the patient.   [place] Controlled Substance Database was reviewed by me.  This patient was seen by Jonetta Osgood, FNP-C in collaboration with Dr. Clayborn Bigness as a part of collaborative care agreement.   Cydne Grahn R. Valetta Fuller, MSN, FNP-C Internal medicine

## 2022-11-01 NOTE — Telephone Encounter (Signed)
Please review it showed you discontinue

## 2022-11-02 ENCOUNTER — Encounter: Payer: Self-pay | Admitting: Nurse Practitioner

## 2022-11-04 ENCOUNTER — Other Ambulatory Visit: Payer: Self-pay | Admitting: Nurse Practitioner

## 2022-11-04 DIAGNOSIS — J4489 Other specified chronic obstructive pulmonary disease: Secondary | ICD-10-CM

## 2022-11-11 ENCOUNTER — Other Ambulatory Visit: Payer: Self-pay | Admitting: Internal Medicine

## 2022-11-11 DIAGNOSIS — J4489 Other specified chronic obstructive pulmonary disease: Secondary | ICD-10-CM

## 2022-11-13 ENCOUNTER — Ambulatory Visit: Admission: RE | Admit: 2022-11-13 | Payer: Medicare Other | Source: Ambulatory Visit

## 2022-11-15 ENCOUNTER — Other Ambulatory Visit: Payer: Self-pay | Admitting: Nurse Practitioner

## 2022-11-18 ENCOUNTER — Encounter: Payer: Self-pay | Admitting: Nurse Practitioner

## 2022-11-21 ENCOUNTER — Other Ambulatory Visit: Payer: Self-pay | Admitting: Nurse Practitioner

## 2022-11-21 DIAGNOSIS — E782 Mixed hyperlipidemia: Secondary | ICD-10-CM

## 2022-11-25 ENCOUNTER — Other Ambulatory Visit: Payer: Self-pay | Admitting: Internal Medicine

## 2022-11-27 ENCOUNTER — Ambulatory Visit: Payer: Medicare Other | Admitting: Nurse Practitioner

## 2022-11-28 ENCOUNTER — Other Ambulatory Visit: Payer: Self-pay | Admitting: Nurse Practitioner

## 2022-11-29 ENCOUNTER — Ambulatory Visit
Admission: RE | Admit: 2022-11-29 | Discharge: 2022-11-29 | Disposition: A | Discharge status: Home / Self Care | Payer: Medicare Other | Source: Ambulatory Visit | Attending: Nurse Practitioner | Admitting: Nurse Practitioner

## 2022-11-29 DIAGNOSIS — J9611 Chronic respiratory failure with hypoxia: Secondary | ICD-10-CM | POA: Insufficient documentation

## 2022-11-29 DIAGNOSIS — R109 Unspecified abdominal pain: Secondary | ICD-10-CM

## 2022-11-29 DIAGNOSIS — F17210 Nicotine dependence, cigarettes, uncomplicated: Secondary | ICD-10-CM | POA: Diagnosis not present

## 2022-11-29 DIAGNOSIS — R1902 Left upper quadrant abdominal swelling, mass and lump: Secondary | ICD-10-CM | POA: Diagnosis not present

## 2022-11-29 DIAGNOSIS — J449 Chronic obstructive pulmonary disease, unspecified: Secondary | ICD-10-CM | POA: Diagnosis not present

## 2022-11-29 DIAGNOSIS — R1900 Intra-abdominal and pelvic swelling, mass and lump, unspecified site: Secondary | ICD-10-CM | POA: Diagnosis not present

## 2022-12-02 ENCOUNTER — Other Ambulatory Visit: Payer: Self-pay | Admitting: Nurse Practitioner

## 2022-12-02 DIAGNOSIS — J4489 Other specified chronic obstructive pulmonary disease: Secondary | ICD-10-CM

## 2022-12-04 ENCOUNTER — Ambulatory Visit (INDEPENDENT_AMBULATORY_CARE_PROVIDER_SITE_OTHER): Payer: Medicare Other | Admitting: Nurse Practitioner

## 2022-12-04 ENCOUNTER — Encounter: Payer: Self-pay | Admitting: Nurse Practitioner

## 2022-12-04 VITALS — BP 131/70 | HR 95 | Temp 98.3°F | Resp 16 | Ht 63.0 in | Wt 137.6 lb

## 2022-12-04 DIAGNOSIS — J9611 Chronic respiratory failure with hypoxia: Secondary | ICD-10-CM

## 2022-12-04 DIAGNOSIS — R109 Unspecified abdominal pain: Secondary | ICD-10-CM

## 2022-12-04 DIAGNOSIS — R1902 Left upper quadrant abdominal swelling, mass and lump: Secondary | ICD-10-CM

## 2022-12-04 DIAGNOSIS — T502X5A Adverse effect of carbonic-anhydrase inhibitors, benzothiadiazides and other diuretics, initial encounter: Secondary | ICD-10-CM | POA: Diagnosis not present

## 2022-12-04 DIAGNOSIS — E876 Hypokalemia: Secondary | ICD-10-CM

## 2022-12-04 DIAGNOSIS — J449 Chronic obstructive pulmonary disease, unspecified: Secondary | ICD-10-CM | POA: Diagnosis not present

## 2022-12-04 MED ORDER — FUROSEMIDE 40 MG PO TABS: ORAL_TABLET | ORAL | 3 refills | Status: DC

## 2022-12-04 MED ORDER — POTASSIUM CHLORIDE ER 8 MEQ PO CPCR: 8.0000 meq | ORAL_CAPSULE | Freq: Every day | ORAL | 0 refills | Status: DC

## 2022-12-04 MED ORDER — SUCRALFATE 1 G PO TABS: ORAL_TABLET | ORAL | 0 refills | Status: DC

## 2022-12-04 MED ORDER — COMBIVENT RESPIMAT 20-100 MCG/ACT IN AERS: INHALATION_SPRAY | RESPIRATORY_TRACT | 3 refills | Status: DC

## 2022-12-04 MED ORDER — ROFLUMILAST 250 MCG PO TABS: 1.0000 | ORAL_TABLET | Freq: Every day | ORAL | 1 refills | Status: DC

## 2022-12-04 MED ORDER — MONTELUKAST SODIUM 10 MG PO TABS: 10.0000 mg | ORAL_TABLET | Freq: Every day | ORAL | 3 refills | Status: DC

## 2022-12-04 NOTE — Progress Notes (Signed)
[facility] [place], [place] [address]  Internal MEDICINE  Office Visit Note  Patient Name: Rachael Blankenship  [id number]  [id number]  Date of Service: 12/04/2022  Chief Complaint  Patient presents with   Follow-up   Hyperlipidemia   Gastroesophageal Reflux    HPI Rachael Blankenship presents for a follow up visit for ultrasound and CT chest results, COPD and refills.  Limited abd ultrasound -- no cystic mass, lesion or hernia was found but she is still having pain in the left side of the abdomen.  CT chest -- coronary artery and aortic atherosclerotic calcification, unknown severity. Lungs show chronic atelectasis with architectural distortion and bronchiectasis of anterior right lower lobe and lingula, increased from 2016 imaging. Also seen is biapical pleural-parenchymal scarring and a few small pulmonary nodules. --COPD/emphysema COPD -- recently set up for NIV at home which has been helping per patient report. Also taking yupelri neb treatment once daily which she says helps when she takes it but she is not consistent and forgets to do the treatment some days.  Low potassium -- on furosemide for fluid retention, takes potassium supplement to correct low potassium, needs refill      Current Medication: Outpatient Encounter Medications as of 12/04/2022  Medication Sig   ALPRAZolam (XANAX) 0.25 MG tablet Take 1 tablet (0.25 mg total) by mouth 3 (three) times daily as needed for anxiety.   amoxicillin-clavulanate (AUGMENTIN) 875-125 MG tablet Take 1 tablet by mouth 2 (two) times daily. Take with food   ascorbic acid (VITAMIN C) 500 MG tablet Take 500 mg by mouth daily.   aspirin 81 MG tablet Take 81 mg by mouth at bedtime.    butalbital-acetaminophen-caffeine (FIORICET) 50-325-40 MG tablet Take 1 tablet by mouth every 6 (six) hours as needed for headache.   chlorpheniramine-HYDROcodone (TUSSIONEX) 10-8 MG/5ML Take 5 mLs by mouth every 12 (twelve) hours as needed for  cough.   Cholecalciferol 25 MCG (1000 UT) tablet Take 1,000 Units by mouth daily.    Coenzyme Q10 (CO Q-10) 100 MG CAPS Take 200 mg by mouth daily.    DENTA 5000 PLUS 1.1 % CREA dental cream Take 1 application by mouth daily.   DULoxetine (CYMBALTA) 30 MG capsule Take 1 capsule (30 mg total) by mouth daily.   ipratropium-albuterol (DUONEB) 0.5-2.5 (3) MG/3ML SOLN Use 1 vial via nebulizer every 4 t0 6 hrs   Magnesium 400 MG TABS Take 400 mg by mouth daily.    mometasone (NASONEX) 50 MCG/ACT nasal spray Place 2 sprays into the nose daily.   mometasone-formoterol (DULERA) 200-5 MCG/ACT AERO Inhale 1 puff into the lungs 2 (two) times daily.   omeprazole (PRILOSEC) 40 MG capsule Take 1 capsule (40 mg total) by mouth 2 (two) times daily.   OXYGEN Inhale into the lungs. Oxygen at night, 2 liters   pt uses AHP for oxygen supplies   revefenacin (YUPELRI) 175 MCG/3ML nebulizer solution Take 3 mLs (175 mcg total) by nebulization daily.   simvastatin (ZOCOR) 20 MG tablet TAKE 1 TABLET(20 MG) BY MOUTH EVERY EVENING FOR CHOLESTEROL   SPIRIVA HANDIHALER 18 MCG inhalation capsule INHALE CONTENTS OF 1 CAPSULE ONCE DAILY USING HANDIHALER   varenicline (CHANTIX PAK) 0.5 MG X 11 & 1 MG X 42 tablet Take one 0.5 mg tablet by mouth once daily for 3 days, then increase to one 0.5 mg tablet twice daily for 4 days, then increase to one 1 mg tablet twice daily.   vitamin B-12 (CYANOCOBALAMIN) 1000 MCG  tablet Take 1,000 mcg by mouth daily.   vitamin E 180 MG (400 UNITS) capsule Take by mouth.   [DISCONTINUED] DALIRESP 250 MCG TABS Take 1 tablet by mouth daily.   [DISCONTINUED] furosemide (LASIX) 40 MG tablet TAKE 1 TABLET BY MOUTH DAILY   [DISCONTINUED] montelukast (SINGULAIR) 10 MG tablet Take 1 tablet (10 mg total) by mouth daily.   [DISCONTINUED] Potassium Chloride CR (MICRO-K) 8 MEQ CPCR capsule CR Take 1 capsule (8 mEq total) by mouth daily.   [DISCONTINUED] sucralfate (CARAFATE) 1 g tablet TAKE 1 TABLET BY MOUTH FOUR  TIMES DAILY AT BEDTIME WITH MEALS   furosemide (LASIX) 40 MG tablet TAKE 1 TABLET BY MOUTH DAILY   Ipratropium-Albuterol (COMBIVENT RESPIMAT) 20-100 MCG/ACT AERS respimat INHALE 1 PUFF INTO THE LUNGS EVERY 6 HOURS AS NEEDED FOR WHEEZING OR SHORTNESS OF BREATH   montelukast (SINGULAIR) 10 MG tablet Take 1 tablet (10 mg total) by mouth daily.   Potassium Chloride CR (MICRO-K) 8 MEQ CPCR capsule CR Take 1 capsule (8 mEq total) by mouth daily.   Roflumilast (DALIRESP) 250 MCG TABS Take 1 tablet by mouth daily.   sucralfate (CARAFATE) 1 g tablet TAKE 1 TABLET BY MOUTH FOUR TIMES DAILY AT BEDTIME WITH MEALS   No facility-administered encounter medications on file as of 12/04/2022.    Surgical History: Past Surgical History:  Procedure Laterality Date   ESOPHAGOGASTRODUODENOSCOPY N/A 07/29/2018   Procedure: ESOPHAGOGASTRODUODENOSCOPY (EGD);  Surgeon: Toledo, Benay Pike, MD;  Location: [facility] ENDOSCOPY;  Service: Gastroenterology;  Laterality: N/A;   KIDNEY STONE SURGERY     TONSILLECTOMY      Medical History: Past Medical History:  Diagnosis Date   COPD (chronic obstructive pulmonary disease) (HCC)    Endometriosis    GERD (gastroesophageal reflux disease)    Hyperlipidemia    Kidney stones     Family History: Family History  Problem Relation Age of Onset   Hypertension Mother    Hyperlipidemia Mother    GER disease Mother    Heart attack Father        12 yrs ago   Hypertension Father    Parkinson's disease Father    GER disease Father     Social History   Socioeconomic History   Marital status: Married    Spouse name: Not on file   Number of children: Not on file   Years of education: Not on file   Highest education level: Not on file  Occupational History   Not on file  Tobacco Use   Smoking status: Every Day    Packs/day: 1.00    Types: Cigarettes    Start date: 11/09/2017    Last attempt to quit: 02/2022    Years since quitting: 0.7   Smokeless tobacco: Never    Tobacco comments:    1 or 2 a day.  Vaping Use   Vaping Use: Former  Substance and Sexual Activity   Alcohol use: Yes    Comment: ocassionally    Drug use: No   Sexual activity: Not on file  Other Topics Concern   Not on file  Social History Narrative   Not on file   Social Determinants of Health   Financial Resource Strain: Not on file  Food Insecurity: Not on file  Transportation Needs: Not on file  Physical Activity: Not on file  Stress: Not on file  Social Connections: Not on file  Intimate Partner Violence: Not on file      Review of Systems  Constitutional:  Positive for fatigue. Negative for chills and unexpected weight change.  HENT:  Negative for congestion, postnasal drip, rhinorrhea, sneezing and sore throat.   Eyes:  Negative for redness.  Respiratory:  Positive for cough, shortness of breath and wheezing. Negative for chest tightness.   Cardiovascular:  Negative for chest pain and palpitations.  Gastrointestinal:  Positive for abdominal distention and abdominal pain. Negative for constipation, diarrhea, nausea and vomiting.  Genitourinary:  Negative for dysuria and frequency.  Musculoskeletal:  Negative for arthralgias, back pain, joint swelling and neck pain.  Skin:  Negative for rash.  Neurological: Negative.  Negative for tremors and numbness.  Hematological:  Negative for adenopathy. Does not bruise/bleed easily.  Psychiatric/Behavioral:  Positive for behavioral problems (Depression) and sleep disturbance. Negative for suicidal ideas. The patient is nervous/anxious.     Vital Signs: BP 131/70   Pulse 95   Temp 98.3 F (36.8 C)   Resp 16   Ht '5\' 3"'$  (1.6 m)   Wt 137 lb 9.6 oz (62.4 kg)   SpO2 93% Comment: 3L  BMI 24.37 kg/m    Physical Exam Vitals reviewed.  Constitutional:      General: She is not in acute distress.    Appearance: Normal appearance. She is normal weight. She is not ill-appearing.  HENT:     Head: Normocephalic and  atraumatic.  Eyes:     Pupils: Pupils are equal, round, and reactive to light.  Cardiovascular:     Rate and Rhythm: Normal rate and regular rhythm.  Abdominal:     General: Bowel sounds are normal. There is distension.     Palpations: Abdomen is soft. There is mass (left upper quadrant). There is no shifting dullness, fluid wave or pulsatile mass.     Tenderness: There is abdominal tenderness in the epigastric area and left upper quadrant.     Hernia: A hernia (possible left upper quadrant?) is present.  Neurological:     Mental Status: She is alert and oriented to person, place, and time.  Psychiatric:        Mood and Affect: Mood normal.        Behavior: Behavior normal.        Assessment/Plan: 1. COPD with hypoxia ([place]) Continue NIV oxygen therapy at home as instructed. Continue continuous oxygen by use of stationary unit at home or portable unit when out of the home as instructed.  Continue yupelri neb treatments daily as prescribed --discussed ways to better adhere to scheduled treatment, put reminder on smart phone or sticky note.  Continue to use combivent rescue inhaler as needed as prescribed.  Continue other medications as prescribed.  Continue to be nonsmoking, quit smoking more than 4 months ago now, good job! - Ipratropium-Albuterol (COMBIVENT RESPIMAT) 20-100 MCG/ACT AERS respimat; INHALE 1 PUFF INTO THE LUNGS EVERY 6 HOURS AS NEEDED FOR WHEEZING OR SHORTNESS OF BREATH  Dispense: 12 g; Refill: 3 - Roflumilast (DALIRESP) 250 MCG TABS; Take 1 tablet by mouth daily.  Dispense: 90 tablet; Refill: 1 - montelukast (SINGULAIR) 10 MG tablet; Take 1 tablet (10 mg total) by mouth daily.  Dispense: 90 tablet; Refill: 3 - furosemide (LASIX) 40 MG tablet; TAKE 1 TABLET BY MOUTH DAILY  Dispense: 90 tablet; Refill: 3  2. Localized abdominal pain Take carafate as prescribed, see if this improves the abdominal pain, patient was not taking this with every meal previously. Referred to  GI as well since ultrasound did not show anything and she is still experiencing this pain  and firmness of her abdomen.  - sucralfate (CARAFATE) 1 g tablet; TAKE 1 TABLET BY MOUTH FOUR TIMES DAILY AT BEDTIME WITH MEALS  Dispense: 360 tablet; Refill: 0 - Ambulatory referral to Gastroenterology  3. Abdominal mass, left upper quadrant Nothing found on abdominal ultrasound, referred to GI for further evaluation.  - Ambulatory referral to Gastroenterology  4. Diuretic-induced hypokalemia Continue taking potassium supplement as prescribed.  - Potassium Chloride CR (MICRO-K) 8 MEQ CPCR capsule CR; Take 1 capsule (8 mEq total) by mouth daily.  Dispense: 90 capsule; Refill: 0  5. Chronic respiratory failure with hypoxia (HCC) Continue use of NIV oxygen therapy and continuous supplemental oxygen as instructed.    General Counseling: lynix bonine understanding of the findings of todays visit and agrees with plan of treatment. I have discussed any further diagnostic evaluation that may be needed or ordered today. We also reviewed her medications today. she has been encouraged to call the office with any questions or concerns that should arise related to todays visit.    Orders Placed This Encounter  Procedures   Ambulatory referral to Gastroenterology    Meds ordered this encounter  Medications   Potassium Chloride CR (MICRO-K) 8 MEQ CPCR capsule CR    Sig: Take 1 capsule (8 mEq total) by mouth daily.    Dispense:  90 capsule    Refill:  0   sucralfate (CARAFATE) 1 g tablet    Sig: TAKE 1 TABLET BY MOUTH FOUR TIMES DAILY AT BEDTIME WITH MEALS    Dispense:  360 tablet    Refill:  0   Ipratropium-Albuterol (COMBIVENT RESPIMAT) 20-100 MCG/ACT AERS respimat    Sig: INHALE 1 PUFF INTO THE LUNGS EVERY 6 HOURS AS NEEDED FOR WHEEZING OR SHORTNESS OF BREATH    Dispense:  12 g    Refill:  3   Roflumilast (DALIRESP) 250 MCG TABS    Sig: Take 1 tablet by mouth daily.    Dispense:  90 tablet     Refill:  1    Fill for 90 days   montelukast (SINGULAIR) 10 MG tablet    Sig: Take 1 tablet (10 mg total) by mouth daily.    Dispense:  90 tablet    Refill:  3   furosemide (LASIX) 40 MG tablet    Sig: TAKE 1 TABLET BY MOUTH DAILY    Dispense:  90 tablet    Refill:  3    Return in about 22 days (around 12/26/2022) for F/U, anxiety med refill, Korie Streat PCP.   Total time spent:30 Minutes Time spent includes review of chart, medications, test results, and follow up plan with the patient.   [place] Controlled Substance Database was reviewed by me.  This patient was seen by Jonetta Osgood, FNP-C in collaboration with Dr. Clayborn Bigness as a part of collaborative care agreement.   Jasdeep Dejarnett R. Valetta Fuller, MSN, FNP-C Internal medicine

## 2022-12-10 ENCOUNTER — Other Ambulatory Visit: Payer: Self-pay

## 2022-12-10 DIAGNOSIS — R0602 Shortness of breath: Secondary | ICD-10-CM

## 2022-12-12 ENCOUNTER — Encounter: Payer: Self-pay | Admitting: Gastroenterology

## 2022-12-12 ENCOUNTER — Telehealth: Payer: Self-pay | Admitting: Nurse Practitioner

## 2022-12-12 ENCOUNTER — Ambulatory Visit (INDEPENDENT_AMBULATORY_CARE_PROVIDER_SITE_OTHER): Payer: Medicare Other | Admitting: Gastroenterology

## 2022-12-12 VITALS — BP 117/63 | HR 64 | Temp 97.7°F | Ht 63.0 in | Wt 137.2 lb

## 2022-12-12 DIAGNOSIS — R1010 Upper abdominal pain, unspecified: Secondary | ICD-10-CM | POA: Diagnosis not present

## 2022-12-12 NOTE — Progress Notes (Signed)
Rachael Darby, MD [address]  [place]  [place], [place] [address]  Main: [phone]  Fax: [phone]    Gastroenterology Consultation  Referring Provider:     Jonetta Osgood, NP Primary Care Physician:  Jonetta Osgood, NP Primary Gastroenterologist:  Dr. Cephas Blankenship Reason for Consultation: Upper abdominal pain        HPI:   Rachael Blankenship is a 67 y.o. female referred by Jonetta Osgood, NP  for consultation & management of upper abdominal pain.  Patient reports approximately 1 month history of pain in her upper abdomen in the epigastric area and above the umbilicus.  Reports dull pain, worse postprandial, associated with some abdominal bloating.  She has severe COPD on home oxygen, lifelong smoker.  Patient is accompanied by her husband today.  She had a limited abdominal ultrasound which was normal, CT chest without contrast did not reveal any acute abnormality.  Patient is therefore referred to GI for further evaluation.  Patient is on long-term PPI and sucralfate for reflux.  She denies any heartburn, burning in her chest, difficulty swallowing or epigastric burning or right upper quadrant pain.  She continues to smoke cigarettes  NSAIDs: None  Antiplts/Anticoagulants/Anti thrombotics: None  GI Procedures:  Upper endoscopy 07/29/2018 by Dr. Alice Reichert   Past Medical History:  Diagnosis Date   COPD (chronic obstructive pulmonary disease) ([place])    Endometriosis    GERD (gastroesophageal reflux disease)    Hyperlipidemia    Kidney stones    Reactive airway disease 03/08/2014    Past Surgical History:  Procedure Laterality Date   ESOPHAGOGASTRODUODENOSCOPY N/A 07/29/2018   Procedure: ESOPHAGOGASTRODUODENOSCOPY (EGD);  Surgeon: Toledo, Benay Pike, MD;  Location: [facility] ENDOSCOPY;  Service: Gastroenterology;  Laterality: N/A;   KIDNEY STONE SURGERY     TONSILLECTOMY       Current Outpatient Medications:    ALPRAZolam (XANAX) 0.25 MG tablet, Take 1  tablet (0.25 mg total) by mouth 3 (three) times daily as needed for anxiety., Disp: 90 tablet, Rfl: 2   ascorbic acid (VITAMIN C) 500 MG tablet, Take 500 mg by mouth daily., Disp: , Rfl:    aspirin 81 MG tablet, Take 81 mg by mouth at bedtime. , Disp: , Rfl:    Coenzyme Q10 (CO Q-10) 100 MG CAPS, Take 200 mg by mouth daily. , Disp: , Rfl:    DENTA 5000 PLUS 1.1 % CREA dental cream, Take 1 application by mouth daily., Disp: , Rfl:    DULoxetine (CYMBALTA) 30 MG capsule, Take 1 capsule (30 mg total) by mouth daily., Disp: 30 capsule, Rfl: 3   furosemide (LASIX) 40 MG tablet, TAKE 1 TABLET BY MOUTH DAILY, Disp: 90 tablet, Rfl: 3   Ipratropium-Albuterol (COMBIVENT RESPIMAT) 20-100 MCG/ACT AERS respimat, INHALE 1 PUFF INTO THE LUNGS EVERY 6 HOURS AS NEEDED FOR WHEEZING OR SHORTNESS OF BREATH, Disp: 12 g, Rfl: 3   ipratropium-albuterol (DUONEB) 0.5-2.5 (3) MG/3ML SOLN, Use 1 vial via nebulizer every 4 t0 6 hrs, Disp: 360 mL, Rfl: 3   Magnesium 400 MG TABS, Take 400 mg by mouth daily. , Disp: , Rfl:    mometasone (NASONEX) 50 MCG/ACT nasal spray, Place 2 sprays into the nose daily., Disp: , Rfl:    mometasone-formoterol (DULERA) 200-5 MCG/ACT AERO, Inhale 1 puff into the lungs 2 (two) times daily., Disp: 13 g, Rfl: 3   montelukast (SINGULAIR) 10 MG tablet, Take 1 tablet (10 mg total) by mouth daily., Disp: 90 tablet, Rfl: 3  omeprazole (PRILOSEC) 40 MG capsule, Take 1 capsule (40 mg total) by mouth 2 (two) times daily., Disp: 180 capsule, Rfl: 0   OXYGEN, Inhale into the lungs. Oxygen at night, 2 liters   pt uses AHP for oxygen supplies, Disp: , Rfl:    Potassium Chloride CR (MICRO-K) 8 MEQ CPCR capsule CR, Take 1 capsule (8 mEq total) by mouth daily., Disp: 90 capsule, Rfl: 0   revefenacin (YUPELRI) 175 MCG/3ML nebulizer solution, Take 3 mLs (175 mcg total) by nebulization daily., Disp: 90 mL, Rfl: 11   Roflumilast (DALIRESP) 250 MCG TABS, Take 1 tablet by mouth daily., Disp: 90 tablet, Rfl: 1    simvastatin (ZOCOR) 20 MG tablet, TAKE 1 TABLET(20 MG) BY MOUTH EVERY EVENING FOR CHOLESTEROL, Disp: 90 tablet, Rfl: 0   SPIRIVA HANDIHALER 18 MCG inhalation capsule, INHALE CONTENTS OF 1 CAPSULE ONCE DAILY USING HANDIHALER, Disp: 30 capsule, Rfl: 0   sucralfate (CARAFATE) 1 g tablet, TAKE 1 TABLET BY MOUTH FOUR TIMES DAILY AT BEDTIME WITH MEALS, Disp: 360 tablet, Rfl: 0   vitamin E 180 MG (400 UNITS) capsule, Take by mouth., Disp: , Rfl:    Cholecalciferol 25 MCG (1000 UT) tablet, Take 1,000 Units by mouth daily. , Disp: , Rfl:    Family History  Problem Relation Age of Onset   Hypertension Mother    Hyperlipidemia Mother    GER disease Mother    Heart attack Father        38 yrs ago   Hypertension Father    Parkinson's disease Father    GER disease Father      Social History   Tobacco Use   Smoking status: Every Day    Packs/day: 1.00    Types: Cigarettes    Start date: 11/09/2017    Last attempt to quit: 02/2022    Years since quitting: 0.8   Smokeless tobacco: Never   Tobacco comments:    1 or 2 a day.  Vaping Use   Vaping Use: Former  Substance Use Topics   Alcohol use: Yes    Comment: ocassionally    Drug use: No    Allergies as of 12/12/2022 - Review Complete 12/04/2022  Allergen Reaction Noted   Naproxen Itching 05/16/2015   Nsaids Other (See Comments) 03/08/2014   Sulfa antibiotics Hives and Rash 03/08/2014    Review of Systems:    All systems reviewed and negative except where noted in HPI.   Physical Exam:  BP 117/63 (BP Location: Left Arm, Patient Position: Sitting, Cuff Size: Normal)   Pulse 64   Temp 97.7 F (36.5 C) (Oral)   Ht '5\' 3"'$  (1.6 m)   Wt 137 lb 4 oz (62.3 kg)   BMI 24.31 kg/m  No LMP recorded. Patient is postmenopausal.  General:   Alert,  Well-developed, well-nourished, pleasant and cooperative in NAD Head:  Normocephalic and atraumatic. Eyes:  Sclera clear, no icterus.   Conjunctiva pink. Ears:  Normal auditory acuity. Nose:   No deformity, discharge, or lesions. Mouth:  No deformity or lesions,oropharynx pink & moist. Neck:  Supple; no masses or thyromegaly. Lungs:  Respirations even and unlabored.  Clear throughout to auscultation.   No wheezes, crackles, or rhonchi. No acute distress. Heart:  Regular rate and rhythm; no murmurs, clicks, rubs, or gallops. Abdomen:  Normal bowel sounds. Soft, mild tenderness and fullness in the epigastric area above the umbilicus and non-distended without masses, hepatosplenomegaly or hernias noted.  No guarding or rebound tenderness.   Rectal:  Not performed Msk:  Symmetrical without gross deformities. Good, equal movement & strength bilaterally. Pulses:  Normal pulses noted. Extremities:  No clubbing or edema.  No cyanosis. Neurologic:  Alert and oriented x3;  grossly normal neurologically. Skin:  Intact without significant lesions or rashes. No jaundice. Psych:  Alert and cooperative. Normal mood and affect.  Imaging Studies: Reviewed  Assessment and Plan:   MANASVINI WHATLEY is a 67 y.o. female with history of heavy tobacco use, severe COPD on home oxygen is seen in consultation for 1 month history of epigastric/upper abdominal pain with early satiety, postprandial bloating.  Recommend CT abdomen and pelvis pancreas protocol given history of heavy tobacco use, rule out any pancreatic malignancy If this is negative, recommend upper endoscopy and colonoscopy for further evaluation Also, recommend to check pancreatic fecal elastase levels based on above workup   Follow up after above workup   Rachael Darby, MD

## 2022-12-12 NOTE — Telephone Encounter (Signed)
Left vm and sent mychart message to confirm 12/18/22 appointment-Toni

## 2022-12-12 NOTE — Patient Instructions (Addendum)
Your ct abdominal and pelvis is schedule for 12/19/2022 arrive at 1:00pm at out patient imaging for a 1:15pm scan. Nothing to eat or drink 4 hours before scan. Address is [address] [place], [place] [address]. Phone number is [phone]

## 2022-12-13 ENCOUNTER — Encounter: Payer: Self-pay | Admitting: Gastroenterology

## 2022-12-18 ENCOUNTER — Other Ambulatory Visit: Payer: Self-pay | Admitting: Nurse Practitioner

## 2022-12-18 ENCOUNTER — Ambulatory Visit: Payer: Medicare Other | Admitting: Internal Medicine

## 2022-12-18 DIAGNOSIS — K219 Gastro-esophageal reflux disease without esophagitis: Secondary | ICD-10-CM

## 2022-12-19 ENCOUNTER — Ambulatory Visit
Admission: RE | Admit: 2022-12-19 | Discharge: 2022-12-19 | Disposition: A | Discharge status: Home / Self Care | Payer: Medicare Other | Source: Ambulatory Visit | Attending: Gastroenterology | Admitting: Gastroenterology

## 2022-12-19 DIAGNOSIS — I700 Atherosclerosis of aorta: Secondary | ICD-10-CM | POA: Diagnosis not present

## 2022-12-19 DIAGNOSIS — R109 Unspecified abdominal pain: Secondary | ICD-10-CM | POA: Diagnosis not present

## 2022-12-19 DIAGNOSIS — R1010 Upper abdominal pain, unspecified: Secondary | ICD-10-CM | POA: Diagnosis not present

## 2022-12-19 LAB — POCT I-STAT CREATININE: Creatinine, Ser: 0.5 mg/dL (ref 0.44–1.00)

## 2022-12-19 MED ORDER — IOHEXOL 300 MG/ML  SOLN
85.0000 mL | Freq: Once | INTRAMUSCULAR | Status: AC | PRN
  Administered 2022-12-19: 85 mL via INTRAVENOUS

## 2022-12-20 ENCOUNTER — Telehealth: Payer: Self-pay

## 2022-12-20 ENCOUNTER — Other Ambulatory Visit: Payer: Self-pay

## 2022-12-20 ENCOUNTER — Other Ambulatory Visit: Payer: Self-pay | Admitting: Nurse Practitioner

## 2022-12-20 DIAGNOSIS — R1010 Upper abdominal pain, unspecified: Secondary | ICD-10-CM

## 2022-12-20 DIAGNOSIS — J449 Chronic obstructive pulmonary disease, unspecified: Secondary | ICD-10-CM

## 2022-12-20 MED ORDER — NA SULFATE-K SULFATE-MG SULF 17.5-3.13-1.6 GM/177ML PO SOLN: 354.0000 mL | Freq: Once | ORAL | 0 refills | Status: AC

## 2022-12-20 NOTE — Telephone Encounter (Signed)
Patient verbalized understanding of results and she would like to schedule procedures for 01/02/2023. Went over instructions, mailed them and sent to [facility] account.

## 2022-12-20 NOTE — Telephone Encounter (Signed)
-----  Message from Lin Landsman, MD sent at 12/20/2022 10:53 AM EST ----- CT scan of the abdomen and pelvis revealed only tiny stones in her left kidney.  Otherwise, no findings to explain her abdominal pain.  As discussed during office visit, recommend upper endoscopy and colonoscopy for further evaluation  [name]

## 2022-12-23 ENCOUNTER — Other Ambulatory Visit: Payer: Self-pay | Admitting: Nurse Practitioner

## 2022-12-23 DIAGNOSIS — J449 Chronic obstructive pulmonary disease, unspecified: Secondary | ICD-10-CM

## 2022-12-24 ENCOUNTER — Other Ambulatory Visit: Payer: Self-pay

## 2022-12-26 ENCOUNTER — Ambulatory Visit (INDEPENDENT_AMBULATORY_CARE_PROVIDER_SITE_OTHER): Payer: Medicare Other | Admitting: Nurse Practitioner

## 2022-12-26 ENCOUNTER — Encounter: Payer: Self-pay | Admitting: Nurse Practitioner

## 2022-12-26 VITALS — BP 138/70 | HR 98 | Temp 96.9°F | Resp 16 | Ht 63.0 in | Wt 139.0 lb

## 2022-12-26 DIAGNOSIS — J449 Chronic obstructive pulmonary disease, unspecified: Secondary | ICD-10-CM | POA: Diagnosis not present

## 2022-12-26 DIAGNOSIS — F411 Generalized anxiety disorder: Secondary | ICD-10-CM | POA: Diagnosis not present

## 2022-12-26 DIAGNOSIS — R109 Unspecified abdominal pain: Secondary | ICD-10-CM | POA: Diagnosis not present

## 2022-12-26 MED ORDER — ALPRAZOLAM 0.25 MG PO TABS: 0.2500 mg | ORAL_TABLET | Freq: Three times a day (TID) | ORAL | 2 refills | Status: DC | PRN

## 2022-12-26 MED ORDER — SUCRALFATE 1 G PO TABS: ORAL_TABLET | ORAL | 0 refills | Status: DC

## 2022-12-26 NOTE — Progress Notes (Signed)
[facility] [place], [place] [address]  Internal MEDICINE  Office Visit Note  Patient Name: Rachael Blankenship  [id number]  [id number]  Date of Service: 12/26/2022  Chief Complaint  Patient presents with   Follow-up   Hyperlipidemia   Gastroesophageal Reflux    HPI Merrin presents for a follow-up visit for hypertension, COPD, abdominal pain and anxiety.  Hypertension -- stable. On lasix Anxiety -- refills, takes prn alprazolam.  COPD --on NIV, sleeps with it on. Has been doing better since starting to use NIV at home.  Abdominal pain -- imaging did not show any abnormalities that could be causing her pain. having upper and lower endoscopy done soon, needs to assess airway risk by anesthesia prior to procedure O2 sat 95% on 2LPM supplemental oxygen.    Current Medication: Outpatient Encounter Medications as of 12/26/2022  Medication Sig   ascorbic acid (VITAMIN C) 500 MG tablet Take 500 mg by mouth daily.   aspirin 81 MG tablet Take 81 mg by mouth at bedtime.    Cholecalciferol 25 MCG (1000 UT) tablet Take 1,000 Units by mouth daily.    Coenzyme Q10 (CO Q-10) 100 MG CAPS Take 200 mg by mouth daily.    DENTA 5000 PLUS 1.1 % CREA dental cream Take 1 application by mouth daily.   DULoxetine (CYMBALTA) 30 MG capsule Take 1 capsule (30 mg total) by mouth daily.   furosemide (LASIX) 40 MG tablet TAKE 1 TABLET BY MOUTH DAILY   Ipratropium-Albuterol (COMBIVENT RESPIMAT) 20-100 MCG/ACT AERS respimat INHALE 1 PUFF INTO THE LUNGS EVERY 6 HOURS AS NEEDED FOR WHEEZING OR SHORTNESS OF BREATH   ipratropium-albuterol (DUONEB) 0.5-2.5 (3) MG/3ML SOLN Use 1 vial via nebulizer every 4 t0 6 hrs   Magnesium 400 MG TABS Take 400 mg by mouth daily.    mometasone (NASONEX) 50 MCG/ACT nasal spray Place 2 sprays into the nose daily.   mometasone-formoterol (DULERA) 200-5 MCG/ACT AERO Inhale 1 puff into the lungs 2 (two) times daily.   montelukast (SINGULAIR) 10 MG tablet Take 1  tablet (10 mg total) by mouth daily.   omeprazole (PRILOSEC) 40 MG capsule Take 1 capsule (40 mg total) by mouth 2 (two) times daily.   OXYGEN Inhale into the lungs. Oxygen at night, 2 liters   pt uses AHP for oxygen supplies   Potassium Chloride CR (MICRO-K) 8 MEQ CPCR capsule CR Take 1 capsule (8 mEq total) by mouth daily.   revefenacin (YUPELRI) 175 MCG/3ML nebulizer solution Take 3 mLs (175 mcg total) by nebulization daily.   Roflumilast (DALIRESP) 250 MCG TABS Take 1 tablet by mouth daily.   simvastatin (ZOCOR) 20 MG tablet TAKE 1 TABLET(20 MG) BY MOUTH EVERY EVENING FOR CHOLESTEROL   SPIRIVA HANDIHALER 18 MCG inhalation capsule INHALE CONTENTS OF 1 CAPSULE ONCE DAILY USING HANDIHALER   vitamin E 180 MG (400 UNITS) capsule Take by mouth.   [DISCONTINUED] ALPRAZolam (XANAX) 0.25 MG tablet Take 1 tablet (0.25 mg total) by mouth 3 (three) times daily as needed for anxiety.   [DISCONTINUED] sucralfate (CARAFATE) 1 g tablet TAKE 1 TABLET BY MOUTH FOUR TIMES DAILY AT BEDTIME WITH MEALS   ALPRAZolam (XANAX) 0.25 MG tablet Take 1 tablet (0.25 mg total) by mouth 3 (three) times daily as needed for anxiety.   sucralfate (CARAFATE) 1 g tablet TAKE 1 TABLET BY MOUTH FOUR TIMES DAILY AT BEDTIME WITH MEALS   No facility-administered encounter medications on file as of 12/26/2022.    Surgical History: Past  Surgical History:  Procedure Laterality Date   ESOPHAGOGASTRODUODENOSCOPY N/A 07/29/2018   Procedure: ESOPHAGOGASTRODUODENOSCOPY (EGD);  Surgeon: Toledo, Benay Pike, MD;  Location: [facility] ENDOSCOPY;  Service: Gastroenterology;  Laterality: N/A;   KIDNEY STONE SURGERY     TONSILLECTOMY      Medical History: Past Medical History:  Diagnosis Date   COPD (chronic obstructive pulmonary disease) ([facility])    Endometriosis    GERD (gastroesophageal reflux disease)    Hyperlipidemia    Kidney stones    Reactive airway disease 03/08/2014    Family History: Family History  Problem Relation Age of Onset    Hypertension Mother    Hyperlipidemia Mother    GER disease Mother    Heart attack Father        11 yrs ago   Hypertension Father    Parkinson's disease Father    GER disease Father     Social History   Socioeconomic History   Marital status: Married    Spouse name: Not on file   Number of children: Not on file   Years of education: Not on file   Highest education level: Not on file  Occupational History   Not on file  Tobacco Use   Smoking status: Every Day    Packs/day: 1.00    Types: Cigarettes    Start date: 11/09/2017    Last attempt to quit: 02/2022    Years since quitting: 0.8   Smokeless tobacco: Never   Tobacco comments:    1 or 2 a day.  Vaping Use   Vaping Use: Former  Substance and Sexual Activity   Alcohol use: Yes    Comment: ocassionally    Drug use: No   Sexual activity: Not on file  Other Topics Concern   Not on file  Social History Narrative   Not on file   Social Determinants of Health   Financial Resource Strain: Not on file  Food Insecurity: Not on file  Transportation Needs: Not on file  Physical Activity: Not on file  Stress: Not on file  Social Connections: Not on file  Intimate Partner Violence: Not on file      Review of Systems  Constitutional:  Positive for fatigue. Negative for chills and unexpected weight change.  HENT:  Negative for congestion, postnasal drip, rhinorrhea, sneezing and sore throat.   Eyes:  Negative for redness.  Respiratory:  Positive for cough, shortness of breath and wheezing. Negative for chest tightness.   Cardiovascular:  Negative for chest pain and palpitations.  Gastrointestinal:  Positive for abdominal distention and abdominal pain. Negative for constipation, diarrhea, nausea and vomiting.  Genitourinary:  Negative for dysuria and frequency.  Musculoskeletal:  Negative for arthralgias, back pain, joint swelling and neck pain.  Skin:  Negative for rash.  Neurological: Negative.  Negative for  tremors and numbness.  Hematological:  Negative for adenopathy. Does not bruise/bleed easily.  Psychiatric/Behavioral:  Positive for behavioral problems (Depression) and sleep disturbance. Negative for suicidal ideas. The patient is nervous/anxious.     Vital Signs: BP 138/70 Comment: 153/74  Pulse 98 Comment: 101  Temp (!) 96.9 F (36.1 C)   Resp 16   Ht '5\' 3"'$  (1.6 m)   Wt 139 lb (63 kg)   SpO2 92% Comment: 3L  BMI 24.62 kg/m    Physical Exam Vitals reviewed.  Constitutional:      General: She is not in acute distress.    Appearance: Normal appearance. She is normal weight. She is not  ill-appearing.  HENT:     Head: Normocephalic and atraumatic.  Eyes:     Pupils: Pupils are equal, round, and reactive to light.  Cardiovascular:     Rate and Rhythm: Normal rate and regular rhythm.  Abdominal:     General: Bowel sounds are normal. There is distension.     Palpations: Abdomen is soft. There is mass (left upper quadrant). There is no shifting dullness, fluid wave or pulsatile mass.     Tenderness: There is abdominal tenderness in the epigastric area and left upper quadrant.     Hernia: A hernia (possible left upper quadrant?) is present.  Neurological:     Mental Status: She is alert and oriented to person, place, and time.  Psychiatric:        Mood and Affect: Mood normal.        Behavior: Behavior normal.       Assessment/Plan: 1. COPD with hypoxia ([place]) Continue supplemental oxygen and NIV use as instructed. No change in medications, continue as prescribed.   2. Localized abdominal pain Continue sucralfate as prescribed. Refills ordered - sucralfate (CARAFATE) 1 g tablet; TAKE 1 TABLET BY MOUTH FOUR TIMES DAILY AT BEDTIME WITH MEALS  Dispense: 360 tablet; Refill: 0  3. Generalized anxiety disorder Continue prn alprazolam as prescribed.  - ALPRAZolam (XANAX) 0.25 MG tablet; Take 1 tablet (0.25 mg total) by mouth 3 (three) times daily as needed for anxiety.   Dispense: 90 tablet; Refill: 2   General Counseling: Jury verbalizes understanding of the findings of todays visit and agrees with plan of treatment. I have discussed any further diagnostic evaluation that may be needed or ordered today. We also reviewed her medications today. she has been encouraged to call the office with any questions or concerns that should arise related to todays visit.    No orders of the defined types were placed in this encounter.   Meds ordered this encounter  Medications   ALPRAZolam (XANAX) 0.25 MG tablet    Sig: Take 1 tablet (0.25 mg total) by mouth 3 (three) times daily as needed for anxiety.    Dispense:  90 tablet    Refill:  2    Please discontinue previous orders of alprazolam, # of tablets changed to 90   sucralfate (CARAFATE) 1 g tablet    Sig: TAKE 1 TABLET BY MOUTH FOUR TIMES DAILY AT BEDTIME WITH MEALS    Dispense:  360 tablet    Refill:  0    Return in about 3 months (around 03/19/2023) for F/U, anxiety med refill, Keysha Damewood PCP.   Total time spent:30 Minutes Time spent includes review of chart, medications, test results, and follow up plan with the patient.   [place] Controlled Substance Database was reviewed by me.  This patient was seen by Jonetta Osgood, FNP-C in collaboration with Dr. Clayborn Bigness as a part of collaborative care agreement.   Alayia Meggison R. Valetta Fuller, MSN, FNP-C Internal medicine

## 2022-12-29 ENCOUNTER — Encounter: Payer: Self-pay | Admitting: Nurse Practitioner

## 2022-12-30 ENCOUNTER — Other Ambulatory Visit: Payer: Self-pay | Admitting: Nurse Practitioner

## 2022-12-30 DIAGNOSIS — J4489 Other specified chronic obstructive pulmonary disease: Secondary | ICD-10-CM

## 2022-12-31 ENCOUNTER — Other Ambulatory Visit: Payer: Self-pay | Admitting: Nurse Practitioner

## 2022-12-31 DIAGNOSIS — J449 Chronic obstructive pulmonary disease, unspecified: Secondary | ICD-10-CM

## 2023-01-01 ENCOUNTER — Ambulatory Visit: Payer: Medicare Other | Admitting: Internal Medicine

## 2023-01-02 ENCOUNTER — Encounter: Payer: Self-pay | Admitting: Gastroenterology

## 2023-01-02 ENCOUNTER — Ambulatory Visit: Payer: Medicare Other | Admitting: Registered Nurse

## 2023-01-02 ENCOUNTER — Encounter
Admission: RE | Disposition: A | Discharge status: Home / Self Care | Payer: Self-pay | Source: Home / Self Care | Attending: Gastroenterology

## 2023-01-02 ENCOUNTER — Ambulatory Visit
Admission: RE | Admit: 2023-01-02 | Discharge: 2023-01-02 | Disposition: A | Discharge status: Home / Self Care | Payer: Medicare Other | Attending: Gastroenterology | Admitting: Gastroenterology

## 2023-01-02 ENCOUNTER — Other Ambulatory Visit: Payer: Self-pay

## 2023-01-02 DIAGNOSIS — E785 Hyperlipidemia, unspecified: Secondary | ICD-10-CM | POA: Insufficient documentation

## 2023-01-02 DIAGNOSIS — D120 Benign neoplasm of cecum: Secondary | ICD-10-CM | POA: Insufficient documentation

## 2023-01-02 DIAGNOSIS — J449 Chronic obstructive pulmonary disease, unspecified: Secondary | ICD-10-CM | POA: Diagnosis not present

## 2023-01-02 DIAGNOSIS — K5730 Diverticulosis of large intestine without perforation or abscess without bleeding: Secondary | ICD-10-CM | POA: Diagnosis not present

## 2023-01-02 DIAGNOSIS — K219 Gastro-esophageal reflux disease without esophagitis: Secondary | ICD-10-CM | POA: Diagnosis not present

## 2023-01-02 DIAGNOSIS — D126 Benign neoplasm of colon, unspecified: Secondary | ICD-10-CM | POA: Diagnosis not present

## 2023-01-02 DIAGNOSIS — K5790 Diverticulosis of intestine, part unspecified, without perforation or abscess without bleeding: Secondary | ICD-10-CM | POA: Diagnosis not present

## 2023-01-02 DIAGNOSIS — K2960 Other gastritis without bleeding: Secondary | ICD-10-CM | POA: Diagnosis not present

## 2023-01-02 DIAGNOSIS — D123 Benign neoplasm of transverse colon: Secondary | ICD-10-CM | POA: Insufficient documentation

## 2023-01-02 DIAGNOSIS — K2970 Gastritis, unspecified, without bleeding: Secondary | ICD-10-CM | POA: Diagnosis not present

## 2023-01-02 DIAGNOSIS — R1013 Epigastric pain: Secondary | ICD-10-CM | POA: Diagnosis not present

## 2023-01-02 DIAGNOSIS — F17200 Nicotine dependence, unspecified, uncomplicated: Secondary | ICD-10-CM | POA: Diagnosis not present

## 2023-01-02 DIAGNOSIS — D122 Benign neoplasm of ascending colon: Secondary | ICD-10-CM | POA: Diagnosis not present

## 2023-01-02 DIAGNOSIS — F17210 Nicotine dependence, cigarettes, uncomplicated: Secondary | ICD-10-CM | POA: Diagnosis not present

## 2023-01-02 DIAGNOSIS — J4489 Other specified chronic obstructive pulmonary disease: Secondary | ICD-10-CM | POA: Insufficient documentation

## 2023-01-02 DIAGNOSIS — D124 Benign neoplasm of descending colon: Secondary | ICD-10-CM | POA: Diagnosis not present

## 2023-01-02 DIAGNOSIS — K635 Polyp of colon: Secondary | ICD-10-CM | POA: Diagnosis not present

## 2023-01-02 DIAGNOSIS — R1084 Generalized abdominal pain: Secondary | ICD-10-CM | POA: Diagnosis not present

## 2023-01-02 DIAGNOSIS — R1010 Upper abdominal pain, unspecified: Secondary | ICD-10-CM | POA: Diagnosis not present

## 2023-01-02 HISTORY — PX: ESOPHAGOGASTRODUODENOSCOPY (EGD) WITH PROPOFOL: SHX5813

## 2023-01-02 HISTORY — PX: COLONOSCOPY WITH PROPOFOL: SHX5780

## 2023-01-02 SURGERY — COLONOSCOPY WITH PROPOFOL
Anesthesia: General

## 2023-01-02 MED ORDER — LIDOCAINE HCL (CARDIAC) PF 100 MG/5ML IV SOSY
PREFILLED_SYRINGE | INTRAVENOUS | Status: DC | PRN
  Administered 2023-01-02: 100 mg via INTRAVENOUS

## 2023-01-02 MED ORDER — PHENYLEPHRINE 80 MCG/ML (10ML) SYRINGE FOR IV PUSH (FOR BLOOD PRESSURE SUPPORT)
PREFILLED_SYRINGE | INTRAVENOUS | Status: DC | PRN
  Administered 2023-01-02: 160 ug via INTRAVENOUS

## 2023-01-02 MED ORDER — GLYCOPYRROLATE 0.2 MG/ML IJ SOLN
INTRAMUSCULAR | Status: DC | PRN
  Administered 2023-01-02: .2 mg via INTRAVENOUS

## 2023-01-02 MED ORDER — SODIUM CHLORIDE 0.9 % IV SOLN: INTRAVENOUS | Status: DC

## 2023-01-02 MED ORDER — EPHEDRINE SULFATE (PRESSORS) 50 MG/ML IJ SOLN
INTRAMUSCULAR | Status: DC | PRN
  Administered 2023-01-02: 5 mg via INTRAVENOUS

## 2023-01-02 MED ORDER — PROPOFOL 10 MG/ML IV BOLUS
INTRAVENOUS | Status: DC | PRN
  Administered 2023-01-02 (×4): 20 mg via INTRAVENOUS
  Administered 2023-01-02: 50 mg via INTRAVENOUS
  Administered 2023-01-02: 30 mg via INTRAVENOUS

## 2023-01-02 MED ORDER — PROPOFOL 500 MG/50ML IV EMUL
INTRAVENOUS | Status: DC | PRN
  Administered 2023-01-02: 145 ug/kg/min via INTRAVENOUS

## 2023-01-02 MED ORDER — DEXMEDETOMIDINE HCL IN NACL 200 MCG/50ML IV SOLN
INTRAVENOUS | Status: DC | PRN
  Administered 2023-01-02: 8 ug via INTRAVENOUS

## 2023-01-02 NOTE — Op Note (Signed)
[facility] Gastroenterology Patient Name: Rachael Blankenship Procedure Date: 01/02/2023 10:49 AM MRN: [id number] Account #: [id number] Date of Birth: 03-Jan-1956 Admit Type: Outpatient Age: 67 Room: [facility] ENDO ROOM 3 Gender: Female Note Status: Finalized Instrument Name: Upper Endoscope [id number] Procedure:             Upper GI endoscopy Indications:           Epigastric abdominal pain Providers:             Lin Landsman MD, MD Referring MD:          Jonetta Osgood (Referring MD) Medicines:             General Anesthesia Complications:         No immediate complications. Estimated blood loss: None. Procedure:             Pre-Anesthesia Assessment:                        - Prior to the procedure, a History and Physical was                         performed, and patient medications and allergies were                         reviewed. The patient is competent. The risks and                         benefits of the procedure and the sedation options and                         risks were discussed with the patient. All questions                         were answered and informed consent was obtained.                         Patient identification and proposed procedure were                         verified by the physician, the nurse, the                         anesthesiologist, the anesthetist and the technician                         in the pre-procedure area in the procedure room in the                         endoscopy suite. Mental Status Examination: alert and                         oriented. Airway Examination: normal oropharyngeal                         airway and neck mobility. Respiratory Examination:                         clear to auscultation. CV Examination: normal.  Prophylactic Antibiotics: The patient does not require                         prophylactic antibiotics. Prior Anticoagulants: The                         patient  has taken no anticoagulant or antiplatelet                         agents. ASA Grade Assessment: III - A patient with                         severe systemic disease. After reviewing the risks and                         benefits, the patient was deemed in satisfactory                         condition to undergo the procedure. The anesthesia                         plan was to use general anesthesia. Immediately prior                         to administration of medications, the patient was                         re-assessed for adequacy to receive sedatives. The                         heart rate, respiratory rate, oxygen saturations,                         blood pressure, adequacy of pulmonary ventilation, and                         response to care were monitored throughout the                         procedure. The physical status of the patient was                         re-assessed after the procedure.                        After obtaining informed consent, the endoscope was                         passed under direct vision. Throughout the procedure,                         the patient's blood pressure, pulse, and oxygen                         saturations were monitored continuously. The Endoscope                         was introduced through the mouth, and advanced to the  second part of duodenum. The upper GI endoscopy was                         accomplished without difficulty. The patient tolerated                         the procedure well. Findings:      The esophagus was normal.      The stomach was normal. Biopsies were taken with a cold forceps for       Helicobacter pylori testing.      The examined duodenum was normal.      The cardia and gastric fundus were normal on retroflexion.      Esophagogastric landmarks were identified: the gastroesophageal junction       was found at 40 cm from the incisors. Impression:            - Normal  esophagus.                        - Normal stomach. Biopsied.                        - Normal examined duodenum.                        - Esophagogastric landmarks identified. Recommendation:        - Await pathology results.                        - Proceed with colonoscopy as scheduled                        See colonoscopy report Procedure Code(s):     --- Professional ---                        [phone], Esophagogastroduodenoscopy, flexible,                         transoral; with biopsy, single or multiple Diagnosis Code(s):     --- Professional ---                        R10.13, Epigastric pain CPT copyright 2022 American Medical Association. All rights reserved. The codes documented in this report are preliminary and upon coder review may  be revised to meet current compliance requirements. Dr. Ulyess Mort Lin Landsman MD, MD 01/02/2023 11:07:41 AM This report has been signed electronically. Number of Addenda: 0 Note Initiated On: 01/02/2023 10:49 AM Estimated Blood Loss:  Estimated blood loss: none.      [facility]

## 2023-01-02 NOTE — Anesthesia Procedure Notes (Signed)
Procedure Name: General with mask airway Date/Time: 01/02/2023 11:08 AM  Performed by: Kelton Pillar, CRNAPre-anesthesia Checklist: Patient identified, Emergency Drugs available, Suction available and Patient being monitored Patient Re-evaluated:Patient Re-evaluated prior to induction Oxygen Delivery Method: Simple face mask Induction Type: IV induction Placement Confirmation: positive ETCO2, CO2 detector and breath sounds checked- equal and bilateral Dental Injury: Teeth and Oropharynx as per pre-operative assessment

## 2023-01-02 NOTE — Anesthesia Postprocedure Evaluation (Signed)
Anesthesia Post Note  Patient: Rachael Blankenship  Procedure(s) Performed: COLONOSCOPY WITH PROPOFOL ESOPHAGOGASTRODUODENOSCOPY (EGD) WITH PROPOFOL  Patient location during evaluation: Endoscopy Anesthesia Type: General Level of consciousness: awake and alert Pain management: pain level controlled Vital Signs Assessment: post-procedure vital signs reviewed and stable Respiratory status: spontaneous breathing, nonlabored ventilation, respiratory function stable and patient connected to nasal cannula oxygen Cardiovascular status: blood pressure returned to baseline and stable Postop Assessment: no apparent nausea or vomiting Anesthetic complications: no   No notable events documented.   Last Vitals:  Vitals:   01/02/23 1201 01/02/23 1211  BP: 115/83 114/77  Pulse: 82   Resp: 19   Temp: (!) 36.4 C   SpO2: 100%     Last Pain:  Vitals:   01/02/23 1221  TempSrc:   PainSc: 0-No pain                 Alphonsus Sias

## 2023-01-02 NOTE — H&P (Signed)
Rachael Darby, MD [address]  [place]  [place], [place] [address]  Main: [phone]  Fax: [phone] Pager: [phone]  Primary Care Physician:  Jonetta Osgood, NP Primary Gastroenterologist:  Dr. Cephas Blankenship  Pre-Procedure History & Physical: HPI:  Rachael Blankenship is a 67 y.o. female is here for an endoscopy and colonoscopy.   Past Medical History:  Diagnosis Date   COPD (chronic obstructive pulmonary disease) ([facility])    Endometriosis    GERD (gastroesophageal reflux disease)    Hyperlipidemia    Kidney stones    Reactive airway disease 03/08/2014    Past Surgical History:  Procedure Laterality Date   ESOPHAGOGASTRODUODENOSCOPY N/A 07/29/2018   Procedure: ESOPHAGOGASTRODUODENOSCOPY (EGD);  Surgeon: Toledo, Benay Pike, MD;  Location: [facility] ENDOSCOPY;  Service: Gastroenterology;  Laterality: N/A;   KIDNEY STONE SURGERY     TONSILLECTOMY      Prior to Admission medications   Medication Sig Start Date End Date Taking? Authorizing Provider  ALPRAZolam (XANAX) 0.25 MG tablet Take 1 tablet (0.25 mg total) by mouth 3 (three) times daily as needed for anxiety. 12/26/22  Yes Abernathy, Yetta Flock, NP  ascorbic acid (VITAMIN C) 500 MG tablet Take 500 mg by mouth daily.   Yes [provider]  aspirin 81 MG tablet Take 81 mg by mouth at bedtime.    Yes [provider]  Cholecalciferol 25 MCG (1000 UT) tablet Take 1,000 Units by mouth daily.    Yes [provider]  Coenzyme Q10 (CO Q-10) 100 MG CAPS Take 200 mg by mouth daily.    Yes [provider]  DULoxetine (CYMBALTA) 30 MG capsule Take 1 capsule (30 mg total) by mouth daily. 10/01/22  Yes Abernathy, Alyssa, NP  furosemide (LASIX) 40 MG tablet TAKE 1 TABLET BY MOUTH DAILY 12/04/22  Yes Abernathy, Alyssa, NP  mometasone (NASONEX) 50 MCG/ACT nasal spray Place 2 sprays into the nose daily.   Yes [provider]  montelukast (SINGULAIR) 10 MG tablet Take 1 tablet (10 mg total) by  mouth daily. 12/04/22  Yes Abernathy, Yetta Flock, NP  omeprazole (PRILOSEC) 40 MG capsule Take 1 capsule (40 mg total) by mouth 2 (two) times daily. 12/18/22  Yes McDonough, Lauren K, PA-C  OXYGEN Inhale into the lungs. Oxygen at night, 2 liters   pt uses [facility] for oxygen supplies   Yes [provider]  Potassium Chloride CR (MICRO-K) 8 MEQ CPCR capsule CR Take 1 capsule (8 mEq total) by mouth daily. 12/04/22  Yes Abernathy, Yetta Flock, NP  Roflumilast (DALIRESP) 250 MCG TABS Take 1 tablet by mouth daily. 12/04/22  Yes Abernathy, Alyssa, NP  simvastatin (ZOCOR) 20 MG tablet TAKE 1 TABLET(20 MG) BY MOUTH EVERY EVENING FOR CHOLESTEROL 11/21/22  Yes Abernathy, Alyssa, NP  sucralfate (CARAFATE) 1 g tablet TAKE 1 TABLET BY MOUTH FOUR TIMES DAILY AT BEDTIME WITH MEALS 12/26/22  Yes Abernathy, Alyssa, NP  vitamin E 180 MG (400 UNITS) capsule Take by mouth.   Yes [provider]  DENTA 5000 PLUS 1.1 % CREA dental cream Take 1 application by mouth daily. 04/14/21   [provider]  Ipratropium-Albuterol (COMBIVENT RESPIMAT) 20-100 MCG/ACT AERS respimat INHALE 1 PUFF INTO THE LUNGS EVERY 6 HOURS AS NEEDED FOR WHEEZING OR SHORTNESS OF BREATH 12/04/22   Jonetta Osgood, NP  ipratropium-albuterol (DUONEB) 0.5-2.5 (3) MG/3ML SOLN Use 1 vial via nebulizer every 4 t0 6 hrs 09/13/21   Jonetta Osgood, NP  Magnesium 400 MG TABS Take 400 mg by mouth daily.  [provider]  mometasone-formoterol (DULERA) 200-5 MCG/ACT AERO Inhale 1 puff into the lungs 2 (two) times daily. 10/23/22   Jonetta Osgood, NP  revefenacin (YUPELRI) 175 MCG/3ML nebulizer solution Take 3 mLs (175 mcg total) by nebulization daily. 10/10/22   Jonetta Osgood, NP  SPIRIVA HANDIHALER 18 MCG inhalation capsule INHALE CONTENTS OF 1 CAPSULE ONCE DAILY USING HANDIHALER 12/30/22   Jonetta Osgood, NP    Allergies as of 12/20/2022 - Review Complete 12/04/2022  Allergen Reaction Noted   Naproxen Itching 05/16/2015   Nsaids  Other (See Comments) 03/08/2014   Sulfa antibiotics Hives and Rash 03/08/2014    Family History  Problem Relation Age of Onset   Hypertension Mother    Hyperlipidemia Mother    GER disease Mother    Heart attack Father        49 yrs ago   Hypertension Father    Parkinson's disease Father    GER disease Father     Social History   Socioeconomic History   Marital status: Married    Spouse name: Not on file   Number of children: Not on file   Years of education: Not on file   Highest education level: Not on file  Occupational History   Not on file  Tobacco Use   Smoking status: Every Day    Packs/day: 1.00    Types: Cigarettes    Start date: 11/09/2017    Last attempt to quit: 02/2022    Years since quitting: 0.8   Smokeless tobacco: Never   Tobacco comments:    1 or 2 a day.  Vaping Use   Vaping Use: Former  Substance and Sexual Activity   Alcohol use: Yes    Comment: ocassionally    Drug use: No   Sexual activity: Not on file  Other Topics Concern   Not on file  Social History Narrative   Not on file   Social Determinants of Health   Financial Resource Strain: Not on file  Food Insecurity: Not on file  Transportation Needs: Not on file  Physical Activity: Not on file  Stress: Not on file  Social Connections: Not on file  Intimate Partner Violence: Not on file    Review of Systems: See HPI, otherwise negative ROS  Physical Exam: BP 129/72   Pulse 89   Temp (!) 97.4 F (36.3 C) (Temporal)   Resp 16   Ht '5\' 3"'$  (1.6 m)   Wt 60.3 kg   SpO2 97%   BMI 23.56 kg/m  General:   Alert,  pleasant and cooperative in NAD Head:  Normocephalic and atraumatic. Neck:  Supple; no masses or thyromegaly. Lungs:  Clear throughout to auscultation.    Heart:  Regular rate and rhythm. Abdomen:  Soft, nontender and nondistended. Normal bowel sounds, without guarding, and without rebound.   Neurologic:  Alert and  oriented x4;  grossly normal  neurologically.  Impression/Plan: Rachael Blankenship is here for an endoscopy and colonoscopy to be performed for 1 month history of epigastric/upper abdominal pain with early satiety, postprandial bloating.   Risks, benefits, limitations, and alternatives regarding  endoscopy and colonoscopy have been reviewed with the patient.  Questions have been answered.  All parties agreeable.   Sherri Sear, MD  01/02/2023, 10:26 AM

## 2023-01-02 NOTE — Anesthesia Preprocedure Evaluation (Signed)
Anesthesia Evaluation  Patient identified by MRN, date of birth, ID band Patient awake    Reviewed: Allergy & Precautions, NPO status , Patient's Chart, lab work & pertinent test results  Airway Mallampati: III  TM Distance: >3 FB Neck ROM: full    Dental  (+) Chipped, Caps   Pulmonary shortness of breath, COPD,  oxygen dependent, Current Smoker and Patient abstained from smoking.   Pulmonary exam normal        Cardiovascular negative cardio ROS Normal cardiovascular exam  Nml EF on ECHO 2021   Neuro/Psych  PSYCHIATRIC DISORDERS Anxiety     negative neurological ROS     GI/Hepatic Neg liver ROS,GERD  Medicated,,  Endo/Other  negative endocrine ROS    Renal/GU Renal disease  negative genitourinary   Musculoskeletal   Abdominal   Peds  Hematology negative hematology ROS (+)   Anesthesia Other Findings Past Medical History: No date: COPD (chronic obstructive pulmonary disease) ([facility]) No date: Endometriosis No date: GERD (gastroesophageal reflux disease) No date: Hyperlipidemia No date: Kidney stones 03/08/2014: Reactive airway disease  Past Surgical History: 07/29/2018: ESOPHAGOGASTRODUODENOSCOPY; N/A     Comment:  Procedure: ESOPHAGOGASTRODUODENOSCOPY (EGD);  Surgeon:               Toledo, Benay Pike, MD;  Location: [facility] ENDOSCOPY;                Service: Gastroenterology;  Laterality: N/A; No date: KIDNEY STONE SURGERY No date: TONSILLECTOMY  BMI    Body Mass Index: 23.56 kg/m      Reproductive/Obstetrics negative OB ROS                             Anesthesia Physical Anesthesia Plan  ASA: 3  Anesthesia Plan: General   Post-op Pain Management:    Induction: Intravenous  PONV Risk Score and Plan: Propofol infusion and TIVA  Airway Management Planned: Natural Airway and Nasal Cannula  Additional Equipment:   Intra-op Plan:   Post-operative Plan:   Informed Consent: I  have reviewed the patients History and Physical, chart, labs and discussed the procedure including the risks, benefits and alternatives for the proposed anesthesia with the patient or authorized representative who has indicated his/her understanding and acceptance.     Dental Advisory Given  Plan Discussed with: Anesthesiologist, CRNA and Surgeon  Anesthesia Plan Comments: (Patient consented for risks of anesthesia including but not limited to:  - adverse reactions to medications - risk of airway placement if required - damage to eyes, teeth, lips or other oral mucosa - nerve damage due to positioning  - sore throat or hoarseness - Damage to heart, brain, nerves, lungs, other parts of body or loss of life  Patient voiced understanding.)        Anesthesia Quick Evaluation

## 2023-01-02 NOTE — Transfer of Care (Signed)
Immediate Anesthesia Transfer of Care Note  Patient: SHIVANI BARRANTES  Procedure(s) Performed: COLONOSCOPY WITH PROPOFOL ESOPHAGOGASTRODUODENOSCOPY (EGD) WITH PROPOFOL  Patient Location: Endoscopy Unit  Anesthesia Type:General  Level of Consciousness: drowsy and patient cooperative  Airway & Oxygen Therapy: Patient Spontanous Breathing and Patient connected to face mask oxygen  Post-op Assessment: Report given to RN and Post -op Vital signs reviewed and stable  Post vital signs: Reviewed and stable  Last Vitals:  Vitals Value Taken Time  BP 115/83 01/02/23 1202  Temp 36.4 C 01/02/23 1201  Pulse 82 01/02/23 1202  Resp 19 01/02/23 1202  SpO2 100 % 01/02/23 1202  Vitals shown include unvalidated device data.  Last Pain:  Vitals:   01/02/23 1201  TempSrc: Temporal  PainSc: Asleep         Complications: No notable events documented.

## 2023-01-02 NOTE — Op Note (Signed)
[facility] Gastroenterology Patient Name: Rachael Blankenship Procedure Date: 01/02/2023 10:48 AM MRN: [id number] Account #: [id number] Date of Birth: [birth year] Admit Type: Outpatient Age: 67 Room: [facility] ENDO ROOM 3 Gender: Female Note Status: Finalized Instrument Name: Peds Colonoscope [id number] Procedure:             Colonoscopy Indications:           Last colonoscopy: January 2010, Generalized abdominal                         pain Providers:             Lin Landsman MD, MD Referring MD:          Jonetta Osgood (Referring MD) Medicines:             General Anesthesia Complications:         No immediate complications. Estimated blood loss: None. Procedure:             Pre-Anesthesia Assessment:                        - Prior to the procedure, a History and Physical was                         performed, and patient medications and allergies were                         reviewed. The patient is competent. The risks and                         benefits of the procedure and the sedation options and                         risks were discussed with the patient. All questions                         were answered and informed consent was obtained.                         Patient identification and proposed procedure were                         verified by the physician, the nurse, the                         anesthesiologist, the anesthetist and the technician                         in the pre-procedure area in the procedure room in the                         endoscopy suite. Mental Status Examination: alert and                         oriented. Airway Examination: normal oropharyngeal                         airway and neck mobility. Respiratory Examination:  clear to auscultation. CV Examination: normal.                         Prophylactic Antibiotics: The patient does not require                         prophylactic antibiotics. Prior  Anticoagulants: The                         patient has taken no anticoagulant or antiplatelet                         agents. ASA Grade Assessment: III - A patient with                         severe systemic disease. After reviewing the risks and                         benefits, the patient was deemed in satisfactory                         condition to undergo the procedure. The anesthesia                         plan was to use general anesthesia. Immediately prior                         to administration of medications, the patient was                         re-assessed for adequacy to receive sedatives. The                         heart rate, respiratory rate, oxygen saturations,                         blood pressure, adequacy of pulmonary ventilation, and                         response to care were monitored throughout the                         procedure. The physical status of the patient was                         re-assessed after the procedure.                        After obtaining informed consent, the colonoscope was                         passed under direct vision. Throughout the procedure,                         the patient's blood pressure, pulse, and oxygen                         saturations were monitored continuously. The  Colonoscope was introduced through the anus and                         advanced to the the terminal ileum, with                         identification of the appendiceal orifice and IC                         valve. The colonoscopy was performed without                         difficulty. The patient tolerated the procedure well.                         The quality of the bowel preparation was adequate to                         identify polyps greater than 5 mm in size. The                         terminal ileum, ileocecal valve, appendiceal orifice,                         and rectum were  photographed. Findings:      The perianal and digital rectal examinations were normal. Pertinent       negatives include normal sphincter tone and no palpable rectal lesions.      The terminal ileum appeared normal.      A 15 mm polyp was found in the cecum. The polyp was flat. Preparations       were made for mucosal resection. Demarcation of the lesion was performed       with narrow band imaging to clearly identify the boundaries of the       lesion. Eleview was injected to raise the lesion. Snare mucosal       resection was performed. Resection and retrieval were complete. Resected       tissue including tissue margins will be examined by histology. To       prevent bleeding after mucosal resection, one hemostatic clip was       successfully placed (MR safe). Clip manufacturer: [employer].       There was no bleeding during, or at the end, of the procedure. Estimated       blood loss: none.      A 12 mm polyp was found in the ileocecal valve. The polyp was sessile.       Preparations were made for mucosal resection. Demarcation of the lesion       was performed with narrow band imaging to clearly identify the       boundaries of the lesion. Eleview was injected to raise the lesion.       Snare mucosal resection was performed. Resection and retrieval were       complete. Resected tissue including tissue margins will be examined by       histology.      Two sessile polyps were found in the ascending colon. The polyps were 3       to 4 mm in size. These polyps were removed with a cold snare. Resection  and retrieval were complete. Estimated blood loss: none.      A 9 mm polyp was found in the ascending colon. The polyp was flat.       Preparations were made for mucosal resection. Demarcation of the lesion       was performed with narrow band imaging to clearly identify the       boundaries of the lesion. Eleview was injected to raise the lesion.       Snare mucosal resection  was performed. Resection and retrieval were       complete. Resected tissue including tissue margins will be examined by       histology. Estimated blood loss: none.      Two sessile polyps were found in the ascending colon. The polyps were 9       to 10 mm in size. These polyps were removed with a hot snare. Resection       and retrieval were complete. Estimated blood loss: none.      Three sessile polyps were found in the descending colon and transverse       colon. The polyps were 10 to 12 mm in size. These polyps were removed       with a hot snare. Resection and retrieval were complete. Estimated blood       loss: none.      A 5 mm polyp was found in the transverse colon. The polyp was sessile.       The polyp was removed with a cold snare. Resection and retrieval were       complete. Estimated blood loss: none.      The retroflexed view of the distal rectum and anal verge was normal and       showed no anal or rectal abnormalities.      Multiple diverticula were found in the recto-sigmoid colon and sigmoid       colon. Impression:            - The examined portion of the ileum was normal.                        - One 15 mm polyp in the cecum, removed with mucosal                         resection. Resected and retrieved. Clip manufacturer:                         [employer]. Clip (MR safe) was placed.                        - One 12 mm polyp at the ileocecal valve, removed with                         mucosal resection. Resected and retrieved.                        - Two 3 to 4 mm polyps in the ascending colon, removed                         with a cold snare. Resected and retrieved.                        -  One 9 mm polyp in the ascending colon, removed with                         mucosal resection. Resected and retrieved.                        - Two 9 to 10 mm polyps in the ascending colon,                         removed with a hot snare. Resected and retrieved.                         - Three 10 to 12 mm polyps in the descending colon and                         in the transverse colon, removed with a hot snare.                         Resected and retrieved.                        - One 5 mm polyp in the transverse colon, removed with                         a cold snare. Resected and retrieved.                        - The distal rectum and anal verge are normal on                         retroflexion view.                        - Diverticulosis in the recto-sigmoid colon and in the                         sigmoid colon.                        - Mucosal resection was performed. Resection and                         retrieval were complete.                        - Mucosal resection was performed. Resection and                         retrieval were complete.                        - Mucosal resection was performed. Resection and                         retrieval were complete. Recommendation:        - Discharge patient to home (with escort).                        - Resume previous diet today.                        -  Continue present medications.                        - Await pathology results.                        - Repeat colonoscopy in 6 months with 2 day prep for                         surveillance after piecemeal polypectomy. Procedure Code(s):     --- Professional ---                        [id number], Colonoscopy, flexible; with endoscopic mucosal                         resection                        [phone], 8, Colonoscopy, flexible; with removal of                         tumor(s), polyp(s), or other lesion(s) by snare                         technique Diagnosis Code(s):     --- Professional ---                        D12.0, Benign neoplasm of cecum                        D12.2, Benign neoplasm of ascending colon                        D12.3, Benign neoplasm of transverse colon (hepatic                         flexure or splenic  flexure)                        D12.4, Benign neoplasm of descending colon                        R10.84, Generalized abdominal pain                        K57.30, Diverticulosis of large intestine without                         perforation or abscess without bleeding CPT copyright 2022 American [employer]. All rights reserved. The codes documented in this report are preliminary and upon coder review may  be revised to meet current compliance requirements. Dr. Ulyess Mort Lin Landsman MD, MD 01/02/2023 12:03:27 PM This report has been signed electronically. Number of Addenda: 0 Note Initiated On: 01/02/2023 10:48 AM Scope Withdrawal Time: 0 hours 44 minutes 21 seconds  Total Procedure Duration: 0 hours 47 minutes 59 seconds  Estimated Blood Loss:  Estimated blood loss: none.      [facility]

## 2023-01-03 ENCOUNTER — Encounter: Payer: Self-pay | Admitting: Gastroenterology

## 2023-01-06 LAB — SURGICAL PATHOLOGY

## 2023-01-07 ENCOUNTER — Encounter: Payer: Self-pay | Admitting: Gastroenterology

## 2023-01-07 ENCOUNTER — Telehealth: Payer: Self-pay

## 2023-01-07 ENCOUNTER — Other Ambulatory Visit: Payer: Self-pay | Admitting: Nurse Practitioner

## 2023-01-07 DIAGNOSIS — G8929 Other chronic pain: Secondary | ICD-10-CM

## 2023-01-07 DIAGNOSIS — K760 Fatty (change of) liver, not elsewhere classified: Secondary | ICD-10-CM

## 2023-01-07 DIAGNOSIS — R7989 Other specified abnormal findings of blood chemistry: Secondary | ICD-10-CM

## 2023-01-07 DIAGNOSIS — J449 Chronic obstructive pulmonary disease, unspecified: Secondary | ICD-10-CM

## 2023-01-07 DIAGNOSIS — F17200 Nicotine dependence, unspecified, uncomplicated: Secondary | ICD-10-CM

## 2023-01-07 DIAGNOSIS — Z86010 Personal history of colonic polyps: Secondary | ICD-10-CM

## 2023-01-07 NOTE — Telephone Encounter (Signed)
Patient verbalized understanding of results. She is okay with referral and will go to [place] to the lab core to have the labs done

## 2023-01-07 NOTE — Telephone Encounter (Signed)
-----   Message from Lin Landsman, MD sent at 01/07/2023 10:32 AM EST ----- Please inform patient that the pathology results from upper endoscopy came back normal.  Recommend to check LFTs and serum GGT levels for right upper quadrant pain Also, check pancreatic fecal elastase levels for chronic epigastric pain, tobacco use  [name]

## 2023-01-07 NOTE — Telephone Encounter (Signed)
-----   Message from Lin Landsman, MD sent at 01/07/2023 10:33 AM EST ----- Pathology results from colon polyps shows several tubular adenomas of the colon, more than 10 in number.  These are benign.  Recommend referral to genetic counselor if patient is agreeable and repeat colonoscopy in 6 months with 2-day prep  [name]

## 2023-01-13 ENCOUNTER — Other Ambulatory Visit: Payer: Self-pay | Admitting: Nurse Practitioner

## 2023-01-13 DIAGNOSIS — J449 Chronic obstructive pulmonary disease, unspecified: Secondary | ICD-10-CM

## 2023-01-15 ENCOUNTER — Ambulatory Visit (INDEPENDENT_AMBULATORY_CARE_PROVIDER_SITE_OTHER): Payer: Medicare Other | Admitting: Internal Medicine

## 2023-01-15 DIAGNOSIS — R0602 Shortness of breath: Secondary | ICD-10-CM | POA: Diagnosis not present

## 2023-01-15 DIAGNOSIS — K760 Fatty (change of) liver, not elsewhere classified: Secondary | ICD-10-CM | POA: Diagnosis not present

## 2023-01-15 DIAGNOSIS — R7989 Other specified abnormal findings of blood chemistry: Secondary | ICD-10-CM | POA: Diagnosis not present

## 2023-01-16 LAB — HEPATIC FUNCTION PANEL
ALT: 20 IU/L (ref 0–32)
AST: 21 IU/L (ref 0–40)
Albumin: 4.3 g/dL (ref 3.9–4.9)
Alkaline Phosphatase: 127 IU/L — ABNORMAL HIGH (ref 44–121)
Bilirubin Total: 0.2 mg/dL (ref 0.0–1.2)
Bilirubin, Direct: 0.1 mg/dL (ref 0.00–0.40)
Total Protein: 6.6 g/dL (ref 6.0–8.5)

## 2023-01-16 LAB — GAMMA GT: GGT: 21 IU/L (ref 0–60)

## 2023-01-20 ENCOUNTER — Telehealth: Payer: Self-pay

## 2023-01-20 ENCOUNTER — Other Ambulatory Visit: Payer: Self-pay | Admitting: Nurse Practitioner

## 2023-01-20 DIAGNOSIS — R7989 Other specified abnormal findings of blood chemistry: Secondary | ICD-10-CM

## 2023-01-20 DIAGNOSIS — F411 Generalized anxiety disorder: Secondary | ICD-10-CM

## 2023-01-20 DIAGNOSIS — F418 Other specified anxiety disorders: Secondary | ICD-10-CM

## 2023-01-20 DIAGNOSIS — E559 Vitamin D deficiency, unspecified: Secondary | ICD-10-CM

## 2023-01-20 NOTE — Telephone Encounter (Signed)
Patient states she will go for labs in [place]. She states the lab did not give her a stool kit. She states she will ask for it when she goes for the vitamin d

## 2023-01-20 NOTE — Telephone Encounter (Signed)
-----   Message from Lin Landsman, MD sent at 01/19/2023  9:43 PM EST ----- GGT is normal, please check serum vitamin D levels.  Also, please remind patient about pancreatic fecal elastase levels  [name]

## 2023-01-26 NOTE — Procedures (Signed)
[facility] [address] [place], [address]    Complete Pulmonary Function Testing Interpretation:  FINDINGS:  The forced vital capacity is severely decreased.  The FEV1 is 0.57 L which is 25% of predicted and is very severely decreased.  FEV1 FEC ratio is moderately decreased.  Postbronchodilator no significant changes noted in the FEV1.  Total lung capacity is within normal range residual volume is increased.  FRC was severely decreased.  Residual volume total capacity ratio is increased.  Patient was not able to perform DLCO so measurement is inaccurate  IMPRESSION:  This pulmonary function study is consistent with severe obstructive lung disease.  Clinical correlation is recommended  Allyne Gee, MD [facility] Pulmonary Critical Care Medicine Sleep Medicine

## 2023-01-27 ENCOUNTER — Other Ambulatory Visit: Payer: Self-pay | Admitting: Nurse Practitioner

## 2023-01-27 DIAGNOSIS — J4489 Other specified chronic obstructive pulmonary disease: Secondary | ICD-10-CM

## 2023-01-28 ENCOUNTER — Ambulatory Visit (INDEPENDENT_AMBULATORY_CARE_PROVIDER_SITE_OTHER): Payer: Medicare Other | Admitting: Internal Medicine

## 2023-01-28 ENCOUNTER — Encounter: Payer: Self-pay | Admitting: Internal Medicine

## 2023-01-28 VITALS — BP 132/79 | HR 91 | Temp 98.3°F | Resp 16 | Ht 63.0 in | Wt 134.8 lb

## 2023-01-28 DIAGNOSIS — J449 Chronic obstructive pulmonary disease, unspecified: Secondary | ICD-10-CM | POA: Diagnosis not present

## 2023-01-28 DIAGNOSIS — F418 Other specified anxiety disorders: Secondary | ICD-10-CM | POA: Diagnosis not present

## 2023-01-28 DIAGNOSIS — J9611 Chronic respiratory failure with hypoxia: Secondary | ICD-10-CM | POA: Diagnosis not present

## 2023-01-28 LAB — PULMONARY FUNCTION TEST

## 2023-01-28 NOTE — Progress Notes (Signed)
[facility] [place], [place] [address]  Pulmonary Sleep Medicine   Office Visit Note  Patient Name: Rachael Blankenship DOB: [birth year] MRN [id number]  Date of Service: 01/28/2023  Complaints/HPI: She is here for follow-up of pulmonary functions.  She had a PFT done on 21 February which shows very severe obstructive lung disease and her FEV1 is now 25% of predicted.  In comparison with the last PFT which was done December 05, 2021 there has been a decline of about 200 cc total.  On her last visit she had started smoking once again and once again I have told her the importance of staying away from cigarette smoke and there is currently significant decline in her FEV1 compared to the last visit.  ROS  General: (-) fever, (-) chills, (-) night sweats, (-) weakness Skin: (-) rashes, (-) itching,. Eyes: (-) visual changes, (-) redness, (-) itching. Nose and Sinuses: (-) nasal stuffiness or itchiness, (-) postnasal drip, (-) nosebleeds, (-) sinus trouble. Mouth and Throat: (-) sore throat, (-) hoarseness. Neck: (-) swollen glands, (-) enlarged thyroid, (-) neck pain. Respiratory: + cough, (-) bloody sputum, + shortness of breath, + wheezing. Cardiovascular: - ankle swelling, (-) chest pain. Lymphatic: (-) lymph node enlargement. Neurologic: (-) numbness, (-) tingling. Psychiatric: (-) anxiety, (-) depression   Current Medication: Outpatient Encounter Medications as of 01/28/2023  Medication Sig   ALPRAZolam (XANAX) 0.25 MG tablet Take 1 tablet (0.25 mg total) by mouth 3 (three) times daily as needed for anxiety.   ascorbic acid (VITAMIN C) 500 MG tablet Take 500 mg by mouth daily.   aspirin 81 MG tablet Take 81 mg by mouth at bedtime.    Cholecalciferol 25 MCG (1000 UT) tablet Take 1,000 Units by mouth daily.    Coenzyme Q10 (CO Q-10) 100 MG CAPS Take 200 mg by mouth daily.    DENTA 5000 PLUS 1.1 % CREA dental cream Take 1 application by mouth daily.   DULoxetine  (CYMBALTA) 30 MG capsule Take 1 capsule (30 mg total) by mouth daily.   furosemide (LASIX) 40 MG tablet TAKE 1 TABLET BY MOUTH DAILY   Ipratropium-Albuterol (COMBIVENT RESPIMAT) 20-100 MCG/ACT AERS respimat INHALE 1 PUFF INTO THE LUNGS EVERY 6 HOURS AS NEEDED FOR WHEEZING OR SHORTNESS OF BREATH   ipratropium-albuterol (DUONEB) 0.5-2.5 (3) MG/3ML SOLN Use 1 vial via nebulizer every 4 t0 6 hrs   Magnesium 400 MG TABS Take 400 mg by mouth daily.    mometasone (NASONEX) 50 MCG/ACT nasal spray Place 2 sprays into the nose daily.   mometasone-formoterol (DULERA) 200-5 MCG/ACT AERO Inhale 1 puff into the lungs 2 (two) times daily.   montelukast (SINGULAIR) 10 MG tablet Take 1 tablet (10 mg total) by mouth daily.   omeprazole (PRILOSEC) 40 MG capsule Take 1 capsule (40 mg total) by mouth 2 (two) times daily.   OXYGEN Inhale into the lungs. Oxygen at night, 2 liters   pt uses AHP for oxygen supplies   Potassium Chloride CR (MICRO-K) 8 MEQ CPCR capsule CR Take 1 capsule (8 mEq total) by mouth daily.   revefenacin (YUPELRI) 175 MCG/3ML nebulizer solution Take 3 mLs (175 mcg total) by nebulization daily.   Roflumilast 250 MCG TABS Take 1 tablet by mouth daily.   simvastatin (ZOCOR) 20 MG tablet TAKE 1 TABLET(20 MG) BY MOUTH EVERY EVENING FOR CHOLESTEROL   SPIRIVA HANDIHALER 18 MCG inhalation capsule INHALE CONTENTS OF 1 CAPSULE ONCE DAILY USING HANDIHALER   sucralfate (CARAFATE) 1 g  tablet TAKE 1 TABLET BY MOUTH FOUR TIMES DAILY AT BEDTIME WITH MEALS   vitamin E 180 MG (400 UNITS) capsule Take by mouth.   No facility-administered encounter medications on file as of 01/28/2023.    Surgical History: Past Surgical History:  Procedure Laterality Date   COLONOSCOPY WITH PROPOFOL N/A 01/02/2023   Procedure: COLONOSCOPY WITH PROPOFOL;  Surgeon: Lin Landsman, MD;  Location: [facility] ENDOSCOPY;  Service: Gastroenterology;  Laterality: N/A;   ESOPHAGOGASTRODUODENOSCOPY N/A 07/29/2018   Procedure:  ESOPHAGOGASTRODUODENOSCOPY (EGD);  Surgeon: Toledo, Benay Pike, MD;  Location: [facility] ENDOSCOPY;  Service: Gastroenterology;  Laterality: N/A;   ESOPHAGOGASTRODUODENOSCOPY (EGD) WITH PROPOFOL N/A 01/02/2023   Procedure: ESOPHAGOGASTRODUODENOSCOPY (EGD) WITH PROPOFOL;  Surgeon: Lin Landsman, MD;  Location: [facility] ENDOSCOPY;  Service: Gastroenterology;  Laterality: N/A;   KIDNEY STONE SURGERY     TONSILLECTOMY      Medical History: Past Medical History:  Diagnosis Date   COPD (chronic obstructive pulmonary disease) (HCC)    Endometriosis    GERD (gastroesophageal reflux disease)    Hyperlipidemia    Kidney stones    Reactive airway disease 03/08/2014    Family History: Family History  Problem Relation Age of Onset   Hypertension Mother    Hyperlipidemia Mother    GER disease Mother    Heart attack Father        49 yrs ago   Hypertension Father    Parkinson's disease Father    GER disease Father     Social History: Social History   Socioeconomic History   Marital status: Married    Spouse name: Not on file   Number of children: Not on file   Years of education: Not on file   Highest education level: Not on file  Occupational History   Not on file  Tobacco Use   Smoking status: Every Day    Packs/day: 1.00    Types: Cigarettes    Start date: 11/09/2017    Last attempt to quit: 02/2022    Years since quitting: 0.9   Smokeless tobacco: Never   Tobacco comments:    1 or 2 a day.  Vaping Use   Vaping Use: Former  Substance and Sexual Activity   Alcohol use: Yes    Comment: ocassionally    Drug use: No   Sexual activity: Not on file  Other Topics Concern   Not on file  Social History Narrative   Not on file   Social Determinants of Health   Financial Resource Strain: Not on file  Food Insecurity: Not on file  Transportation Needs: Not on file  Physical Activity: Not on file  Stress: Not on file  Social Connections: Not on file  Intimate Partner Violence:  Not on file    Vital Signs: Blood pressure 132/79, pulse 91, temperature 98.3 F (36.8 C), resp. rate 16, height '5\' 3"'$  (1.6 m), weight 134 lb 12.8 oz (61.1 kg), SpO2 92 %.  Examination: General Appearance: The patient is well-developed, well-nourished, and in no distress. Skin: Gross inspection of skin unremarkable. Head: normocephalic, no gross deformities. Eyes: no gross deformities noted. ENT: ears appear grossly normal no exudates. Neck: Supple. No thyromegaly. No LAD. Respiratory: No rhonchi are noted very distant breath sounds. Cardiovascular: Normal S1 and S2 without murmur or rub. Extremities: No cyanosis. pulses are equal. Neurologic: Alert and oriented. No involuntary movements.  LABS: Recent Results (from the past 2160 hour(s))  I-STAT creatinine     Status: None   Collection Time:  12/19/22  1:39 PM  Result Value Ref Range   Creatinine, Ser 0.50 0.44 - 1.00 mg/dL  Surgical pathology     Status: None   Collection Time: 01/02/23 11:03 AM  Result Value Ref Range   SURGICAL PATHOLOGY      SURGICAL PATHOLOGY CASE: [id number] PATIENT: Ginger Carne Surgical Pathology Report     Specimen Submitted: A. Stomach, random; cbx B. Colon polyp, cecum; hot snare C. Colon polyp x5, asc; h(3) c(2) snare D. Ileocecal valve polyp; hot snare E. Colon polyp x2, trans; c(1) h(1) snare F. Colon polyp x2, descending; hot snare  Clinical History: Upper abdominal pain R10.10.  Normal upper endoscopy; colon polyps; diverticulosis      DIAGNOSIS: A.  STOMACH, RANDOM; COLD BIOPSY: - ANTRAL MUCOSA WITH REACTIVE AND HEALING EROSIVE GASTRITIS. - OXYNTIC MUCOSA WITH CHANGES CONSISTENT WITH PROTON PUMP INHIBITOR EFFECT. - NEGATIVE FOR H. PYLORI, INTESTINAL METAPLASIA, DYSPLASIA, AND MALIGNANCY.  B.  COLON POLYP, CECUM; HOT SNARE: - TUBULAR ADENOMA. - NEGATIVE FOR HIGH-GRADE DYSPLASIA AND MALIGNANCY.  C.  COLON POLYP X 5, ASCENDING; HOT SNARE (3) AND COLD SNARE (2): -  TUBULAR ADENOMA (MULTIPLE FRAGMENTS). - NEGATIVE FOR HIGH-GRADE DYSPLASIA AND MALIGNANCY.  D .  ILEOCECAL VALVE POLYP; HOT SNARE: - TUBULAR ADENOMA. - NEGATIVE FOR HIGH-GRADE DYSPLASIA AND MALIGNANCY.  E.  COLON POLYP X 2, TRANSVERSE; COLD SNARE AND HOT SNARE: - TUBULAR ADENOMA (2). - NEGATIVE FOR HIGH-GRADE DYSPLASIA AND MALIGNANCY.  F.  COLON POLYP X 2, DESCENDING; HOT SNARE: - TUBULAR ADENOMA (2). - NEGATIVE FOR HIGH-GRADE DYSPLASIA AND MALIGNANCY.  GROSS DESCRIPTION: A. Labeled: cbx random gastric rule out H. pylori Received: Formalin Collection time: 11:03 AM on 01/02/2023 Placed into formalin time: 11:03 AM on 01/02/2023 Tissue fragment(s): 3 Size: Aggregate, 1.1 x 0.5 x 0.1 cm Description: Tan-pink soft tissue fragments Entirely submitted in 1 cassette.  B. Labeled: Hot snare polyp cecum Received: Formalin Collection time: 11:11 AM on 01/02/2023 Placed into formalin time: 11:11 AM on 01/02/2023 Tissue fragment(s): Multiple Size: Aggregate, 1.6 x 0.6 x 0.2 cm Description: Received are at least 2 fragments of pink-tan soft tissue admixed with intestin al debris.  The ratio of soft tissue to intestinal debris is 80: 20. Entirely submitted in 1 cassette.  C. Labeled: Hot snare polyp ascending colon x 3, cold snare x 2 Received: Formalin Collection time: 11:29 AM on 01/02/2023 Placed into formalin time: 11:29 AM on 01/02/2023 Tissue fragment(s): Multiple Size: Aggregate, 2.4 x 0.7 x 0.3 cm Description: Received are fragments of tan-pink soft tissue admixed with intestinal debris.  The ratio of soft tissue to intestinal debris is 80: 20. Entirely submitted in 1 cassette.  D. Labeled: Hot snare polyp ileocecal valve Received: Formalin Collection time: 11:31 AM on 01/02/2023 Placed into formalin time: 11:31 AM on 01/02/2023 Tissue fragment(s): 2 Size: Each 0.5 cm Description: Tan soft tissue fragments Entirely submitted in 1 cassette.  E. Labeled: Hot snare polyp transverse  colon x 1, cold snare x 1 Received: Formalin Collection time: 11:44 AM on 01/02/2023 Placed into formalin time: 11:44 AM on 01/02/2023 Tissue fragment( s): Multiple Size: Aggregate, 0.7 x 0.6 x 0.2 cm Description: Received is a single fragment of tan-pink soft tissue admixed with intestinal debris.  The ratio of soft tissue to intestinal debris is 60: 40. Entirely submitted in 1 cassette.  F. Labeled: Hot snare polyp descending colon x 2 Received: Formalin Collection time: 11:49 AM on 01/02/2023 Placed into formalin time: 11:49 AM on 01/02/2023 Tissue fragment(s): Multiple Size:  Aggregate, 1.2 x 1 x 0.4 cm Description: Received are fragments of tan-pink soft tissue admixed with intestinal debris.  The ratio of soft tissue to intestinal debris is 90: 10.  The 2 larger soft tissue fragments have resection margins which are differentially inked.  These fragments are sectioned. Entirely submitted in cassettes 1-3 with 1 trisected fragment in cassette 1, 1 serially sectioned fragment in cassette 2, and the remaining fragments in cassette 3.  RB 01/02/2023  Final Diagnosis performed by Quay Burow, MD.   Electronically sig ned 01/06/2023 9:10:38AM The electronic signature indicates that the named Attending Pathologist has evaluated the specimen Technical component performed at [facility], [address], [place], [place] [address] Lab: [phone] Dir: Rush Farmer, MD, MMM  Professional component performed at [facility], [facility], [place], [place], [place] [address] Lab: [phone] Dir: Kathi Simpers, MD   Hepatic function panel     Status: Abnormal   Collection Time: 01/15/23 10:47 AM  Result Value Ref Range   Total Protein 6.6 6.0 - 8.5 g/dL   Albumin 4.3 3.9 - 4.9 g/dL   Bilirubin Total 0.2 0.0 - 1.2 mg/dL   Bilirubin, Direct 0.10 0.00 - 0.40 mg/dL   Alkaline Phosphatase 127 (H) 44 - 121 IU/L   AST 21 0 - 40 IU/L   ALT 20 0 - 32 IU/L  Gamma GT     Status:  None   Collection Time: 01/15/23 10:47 AM  Result Value Ref Range   GGT 21 0 - 60 IU/L  Pulmonary Function Test     Status: None   Collection Time: 01/28/23  8:28 AM  Result Value Ref Range   FEV1     FVC     FEV1/FVC     TLC     DLCO      Radiology: No results found.  No results found.  No results found.    Assessment and Plan: Patient Active Problem List   Diagnosis Date Noted   Upper abdominal pain 01/02/2023   Generalized abdominal pain 01/02/2023   COPD with hypoxia ([place]) 01/21/2022   Erythrocytosis 01/21/2022   Tobacco abuse 01/21/2022   History of COVID-19 12/11/2020   Encounter for general adult medical examination with abnormal findings 09/17/2020   Suprapubic abdominal pain 09/17/2020   Encounter for screening mammogram for malignant neoplasm of breast 09/17/2020   Chronic obstructive pulmonary disease ([place]) 05/12/2020   Age-related osteoporosis without current pathological fracture 04/05/2020   Need for vaccination against Streptococcus pneumoniae using pneumococcal conjugate vaccine 13 03/29/2019   Cigarette smoker 11/26/2018   Obstructive chronic bronchitis without exacerbation 08/25/2018   Gastroesophageal reflux disease without esophagitis 08/25/2018   Black stool 08/25/2018   Needs flu shot 08/25/2018   Acute respiratory failure with hypoxemia ([place]) 05/21/2018   Screening for osteoporosis 05/13/2018   Acute upper respiratory infection 05/13/2018   Other fatigue 05/13/2018   Generalized anxiety disorder 05/13/2018   Vitamin D deficiency 05/13/2018   Screening for malignant neoplasm of cervix 05/13/2018   Dysuria 05/13/2018   Chest pain 01/18/2015   Shortness of breath 01/18/2015   Hyperlipemia 03/08/2014   Pulmonary HTN ([place]) 03/08/2014   Reactive airway disease 03/08/2014    1. COPD with hypoxia ([place]) Severe end-stage terminal disease the patient has no interest in stopping smoking we had another lengthy conversation with her regarding  diuretic use.  Also reviewed her pulmonary functions explained to her that she has had significant drop in her FEV1 since the last visit.  Patient is not interested in any evaluation for transplant and indeed she is not a candidate for transplant evaluation.  Her husband was in the room he understands and reiterated to her that she should stop smoking  2. Chronic respiratory failure with hypoxia (HCC) She is on oxygen therapy this will be continued we will continue recommending stop smoking  3. Anxiety with depression At baseline continue with medical management and therapy.   General Counseling: I have discussed the findings of the evaluation and examination with Neoma Laming.  I have also discussed any further diagnostic evaluation thatmay be needed or ordered today. Arjanae verbalizes understanding of the findings of todays visit. We also reviewed her medications today and discussed drug interactions and side effects including but not limited excessive drowsiness and altered mental states. We also discussed that there is always a risk not just to her but also people around her. she has been encouraged to call the office with any questions or concerns that should arise related to todays visit.  No orders of the defined types were placed in this encounter.    Time spent: 33  I have personally obtained a history, examined the patient, evaluated laboratory and imaging results, formulated the assessment and plan and placed orders.    Allyne Gee, MD [facility] Pulmonary and Critical Care Sleep medicine

## 2023-01-30 ENCOUNTER — Inpatient Hospital Stay: Payer: Medicare Other

## 2023-01-30 ENCOUNTER — Inpatient Hospital Stay: Payer: Medicare Other | Admitting: Licensed Clinical Social Worker

## 2023-01-31 ENCOUNTER — Other Ambulatory Visit: Payer: Self-pay

## 2023-01-31 DIAGNOSIS — E876 Hypokalemia: Secondary | ICD-10-CM

## 2023-01-31 DIAGNOSIS — J449 Chronic obstructive pulmonary disease, unspecified: Secondary | ICD-10-CM

## 2023-01-31 MED ORDER — POTASSIUM CHLORIDE ER 8 MEQ PO CPCR: 8.0000 meq | ORAL_CAPSULE | Freq: Every day | ORAL | 0 refills | Status: DC

## 2023-01-31 MED ORDER — MONTELUKAST SODIUM 10 MG PO TABS: 10.0000 mg | ORAL_TABLET | Freq: Every day | ORAL | 3 refills | Status: DC

## 2023-02-17 ENCOUNTER — Other Ambulatory Visit: Payer: Self-pay | Admitting: Nurse Practitioner

## 2023-02-17 DIAGNOSIS — F418 Other specified anxiety disorders: Secondary | ICD-10-CM

## 2023-02-17 DIAGNOSIS — E782 Mixed hyperlipidemia: Secondary | ICD-10-CM

## 2023-02-17 DIAGNOSIS — F411 Generalized anxiety disorder: Secondary | ICD-10-CM

## 2023-02-23 ENCOUNTER — Other Ambulatory Visit: Payer: Self-pay | Admitting: Nurse Practitioner

## 2023-02-23 DIAGNOSIS — J449 Chronic obstructive pulmonary disease, unspecified: Secondary | ICD-10-CM

## 2023-02-28 ENCOUNTER — Other Ambulatory Visit: Payer: Self-pay

## 2023-02-28 ENCOUNTER — Telehealth: Payer: Self-pay

## 2023-02-28 DIAGNOSIS — J4489 Other specified chronic obstructive pulmonary disease: Secondary | ICD-10-CM

## 2023-02-28 MED ORDER — SPIRIVA HANDIHALER 18 MCG IN CAPS: ORAL_CAPSULE | RESPIRATORY_TRACT | 0 refills | Status: DC

## 2023-02-28 MED ORDER — AZITHROMYCIN 250 MG PO TABS: ORAL_TABLET | ORAL | 0 refills | Status: AC

## 2023-02-28 NOTE — Telephone Encounter (Signed)
Husband has a cold and now she is getting it? Has Copd. Runny nose or stuffy nose back and forth some mucus and a bad cough. No fever. Per Leotis Shames is ok to send a Zpack.

## 2023-03-06 ENCOUNTER — Other Ambulatory Visit: Payer: Self-pay | Admitting: Nurse Practitioner

## 2023-03-06 DIAGNOSIS — J449 Chronic obstructive pulmonary disease, unspecified: Secondary | ICD-10-CM

## 2023-03-17 ENCOUNTER — Other Ambulatory Visit: Payer: Self-pay | Admitting: Nurse Practitioner

## 2023-03-17 DIAGNOSIS — K219 Gastro-esophageal reflux disease without esophagitis: Secondary | ICD-10-CM

## 2023-03-17 DIAGNOSIS — F411 Generalized anxiety disorder: Secondary | ICD-10-CM

## 2023-03-17 DIAGNOSIS — F418 Other specified anxiety disorders: Secondary | ICD-10-CM

## 2023-03-24 ENCOUNTER — Encounter: Payer: Self-pay | Admitting: Nurse Practitioner

## 2023-03-24 ENCOUNTER — Ambulatory Visit (INDEPENDENT_AMBULATORY_CARE_PROVIDER_SITE_OTHER): Payer: Medicare Other | Admitting: Nurse Practitioner

## 2023-03-24 VITALS — BP 130/80 | HR 100 | Temp 98.5°F | Resp 16 | Ht 63.0 in | Wt 132.2 lb

## 2023-03-24 DIAGNOSIS — F17210 Nicotine dependence, cigarettes, uncomplicated: Secondary | ICD-10-CM | POA: Diagnosis not present

## 2023-03-24 DIAGNOSIS — J9611 Chronic respiratory failure with hypoxia: Secondary | ICD-10-CM | POA: Diagnosis not present

## 2023-03-24 DIAGNOSIS — F411 Generalized anxiety disorder: Secondary | ICD-10-CM

## 2023-03-24 DIAGNOSIS — F418 Other specified anxiety disorders: Secondary | ICD-10-CM

## 2023-03-24 DIAGNOSIS — E782 Mixed hyperlipidemia: Secondary | ICD-10-CM | POA: Diagnosis not present

## 2023-03-24 DIAGNOSIS — K2930 Chronic superficial gastritis without bleeding: Secondary | ICD-10-CM

## 2023-03-24 DIAGNOSIS — J449 Chronic obstructive pulmonary disease, unspecified: Secondary | ICD-10-CM

## 2023-03-24 MED ORDER — ALPRAZOLAM 0.25 MG PO TABS: 0.2500 mg | ORAL_TABLET | Freq: Three times a day (TID) | ORAL | 2 refills | Status: DC | PRN

## 2023-03-24 MED ORDER — FAMOTIDINE 40 MG PO TABS: 40.0000 mg | ORAL_TABLET | Freq: Every day | ORAL | 5 refills | Status: DC

## 2023-03-24 MED ORDER — DULOXETINE HCL 60 MG PO CPEP
60.0000 mg | ORAL_CAPSULE | Freq: Every day | ORAL | 5 refills | Status: DC
Start: 2023-03-24 — End: 2023-09-08

## 2023-03-24 NOTE — Progress Notes (Signed)
[facility] [address] [place], [place] [address]  Internal MEDICINE  Office Visit Note  Patient Name: Rachael Blankenship  [id number]  [id number]  Date of Service: 03/24/2023  Chief Complaint  Patient presents with   Follow-up   Hyperlipidemia   Gastroesophageal Reflux   Quality Metric Gaps    Mammogram    HPI [name] presents for a follow-up visit for COPD, anxiety, high cholesterol.  COPD/respiratory failure -- on continuous oxygen, 2 LPM via [place]. Wears NIV at home and at night, doing well. Continues to use yupelri. Started smoking again, about 10 cigarettes per day. Has had increased anxiety, stress and depression.  Anxiety -- taking prn alprazolam which helps, on duloxetine 30 mg daily.  High cholesterol -- takes simvastatin CRC screening -- recent colonoscopy results -- several benign tubular adenomas and signs of chronic gastritis.      Current Medication: Outpatient Encounter Medications as of 03/24/2023  Medication Sig   ascorbic acid (VITAMIN C) 500 MG tablet Take 500 mg by mouth daily.   aspirin 81 MG tablet Take 81 mg by mouth at bedtime.    Cholecalciferol 25 MCG (1000 UT) tablet Take 1,000 Units by mouth daily.    Coenzyme Q10 (CO Q-10) 100 MG CAPS Take 200 mg by mouth daily.    DENTA 5000 PLUS 1.1 % CREA dental cream Take 1 application by mouth daily.   DULoxetine (CYMBALTA) 60 MG capsule Take 1 capsule (60 mg total) by mouth daily.   famotidine (PEPCID) 40 MG tablet Take 1 tablet (40 mg total) by mouth daily.   furosemide (LASIX) 40 MG tablet TAKE 1 TABLET BY MOUTH DAILY   Ipratropium-Albuterol (COMBIVENT RESPIMAT) 20-100 MCG/ACT AERS respimat INHALE 1 PUFF INTO THE LUNGS EVERY 6 HOURS AS NEEDED FOR WHEEZING OR SHORTNESS OF BREATH   ipratropium-albuterol (DUONEB) 0.5-2.5 (3) MG/3ML SOLN Use 1 vial via nebulizer every 4 t0 6 hrs   Magnesium 400 MG TABS Take 400 mg by mouth daily.    mometasone (NASONEX) 50 MCG/ACT nasal spray Place 2 sprays into the  nose daily.   mometasone-formoterol (DULERA) 200-5 MCG/ACT AERO Inhale 1 puff into the lungs 2 (two) times daily.   montelukast (SINGULAIR) 10 MG tablet Take 1 tablet (10 mg total) by mouth daily.   omeprazole (PRILOSEC) 40 MG capsule Take 1 capsule (40 mg total) by mouth 2 (two) times daily.   OXYGEN Inhale into the lungs. Oxygen at night, 2 liters   pt uses AHP for oxygen supplies   Potassium Chloride CR (MICRO-K) 8 MEQ CPCR capsule CR Take 1 capsule (8 mEq total) by mouth daily.   revefenacin (YUPELRI) 175 MCG/3ML nebulizer solution Take 3 mLs (175 mcg total) by nebulization daily.   Roflumilast 250 MCG TABS TAKE 1 TABLET BY MOUTH EVERY DAY   simvastatin (ZOCOR) 20 MG tablet TAKE 1 TABLET(20 MG) BY MOUTH EVERY EVENING FOR CHOLESTEROL   SPIRIVA HANDIHALER 18 MCG inhalation capsule INHALE CONTENTS OF 1 CAPSULE ONCE DAILY USING HANDIHALER   sucralfate (CARAFATE) 1 g tablet TAKE 1 TABLET BY MOUTH FOUR TIMES DAILY AT BEDTIME WITH MEALS   vitamin E 180 MG (400 UNITS) capsule Take by mouth.   [DISCONTINUED] ALPRAZolam (XANAX) 0.25 MG tablet Take 1 tablet (0.25 mg total) by mouth 3 (three) times daily as needed for anxiety.   [DISCONTINUED] DULoxetine (CYMBALTA) 30 MG capsule Take 1 capsule (30 mg total) by mouth daily.   ALPRAZolam (XANAX) 0.25 MG tablet Take 1 tablet (0.25 mg total) by mouth  3 (three) times daily as needed for anxiety.   No facility-administered encounter medications on file as of 03/24/2023.    Surgical History: Past Surgical History:  Procedure Laterality Date   COLONOSCOPY WITH PROPOFOL N/A 01/02/2023   Procedure: COLONOSCOPY WITH PROPOFOL;  Surgeon: Toney Reil, MD;  Location: [facility] ENDOSCOPY;  Service: Gastroenterology;  Laterality: N/A;   ESOPHAGOGASTRODUODENOSCOPY N/A 07/29/2018   Procedure: ESOPHAGOGASTRODUODENOSCOPY (EGD);  Surgeon: Toledo, Boykin Nearing, MD;  Location: [facility] ENDOSCOPY;  Service: Gastroenterology;  Laterality: N/A;   ESOPHAGOGASTRODUODENOSCOPY (EGD) WITH  PROPOFOL N/A 01/02/2023   Procedure: ESOPHAGOGASTRODUODENOSCOPY (EGD) WITH PROPOFOL;  Surgeon: Toney Reil, MD;  [facility] ENDOSCOPY;  Service: Gastroenterology;  Laterality: N/A;   KIDNEY STONE SURGERY     TONSILLECTOMY      Medical History: Past Medical History:  Diagnosis Date   COPD (chronic obstructive pulmonary disease) (HCC)    Endometriosis    GERD (gastroesophageal reflux disease)    Hyperlipidemia    Kidney stones    Reactive airway disease 03/08/2014    Family History: Family History  Problem Relation Age of Onset   Hypertension Mother    Hyperlipidemia Mother    GER disease Mother    Heart attack Father        23 yrs ago   Hypertension Father    Parkinson's disease Father    GER disease Father     Social History   Socioeconomic History   Marital status: Married    Spouse name: Not on file   Number of children: Not on file   Years of education: Not on file   Highest education level: Not on file  Occupational History   Not on file  Tobacco Use   Smoking status: Every Day    Packs/day: 1    Types: Cigarettes    Start date: 11/09/2017    Last attempt to quit: 02/2022    Years since quitting: 1.0   Smokeless tobacco: Never   Tobacco comments:    6 cigarettes daily.  Vaping Use   Vaping Use: Former  Substance and Sexual Activity   Alcohol use: Yes    Comment: ocassionally    Drug use: No   Sexual activity: Not on file  Other Topics Concern   Not on file  Social History Narrative   Not on file   Social Determinants of Health   Financial Resource Strain: Not on file  Food Insecurity: Not on file  Transportation Needs: Not on file  Physical Activity: Not on file  Stress: Not on file  Social Connections: Not on file  Intimate Partner Violence: Not on file      Review of Systems  Vital Signs: BP 130/80   Pulse 100   Temp 98.5 F (36.9 C)   Resp 16   Ht 5\' 3"  (1.6 m)   Wt 132 lb 3.2 oz (60 kg)   SpO2 94% Comment: patient  is on 4 liters of O2  BMI 23.42 kg/m    Physical Exam     Assessment/Plan: 1. Chronic respiratory failure with hypoxia (HCC) Continue use of NIV and supplemental oxygen as instructed.  Continue daily yupelri nebulizer treatments.   2. COPD with hypoxia (HCC) Continue using supplemental oxygen as instructed and NIV as instructed.  Continue prescribed treatments and inhalers.   3. Chronic superficial gastritis without bleeding Start famotidine as prescribed.  - famotidine (PEPCID) 40 MG tablet; Take 1 tablet (40 mg total) by mouth daily.  Dispense: 30 tablet; Refill: 5  4. Mixed hyperlipidemia Continue simvastatin as prescribed.   5. Depression with anxiety Duloxetine dose increased.  Continue alprazolam prn as prescribed.  - ALPRAZolam (XANAX) 0.25 MG tablet; Take 1 tablet (0.25 mg total) by mouth 3 (three) times daily as needed for anxiety.  Dispense: 90 tablet; Refill: 2 - DULoxetine (CYMBALTA) 60 MG capsule; Take 1 capsule (60 mg total) by mouth daily.  Dispense: 30 capsule; Refill: 5  6. Cigarette smoker Started smoking again, 10 cigarettes per day. Discussed smoking cessation and the benefits of it.    General Counseling: yoselyn vanriper understanding of the findings of todays visit and agrees with plan of treatment. I have discussed any further diagnostic evaluation that may be needed or ordered today. We also reviewed her medications today. she has been encouraged to call the office with any questions or concerns that should arise related to todays visit.    No orders of the defined types were placed in this encounter.   Meds ordered this encounter  Medications   famotidine (PEPCID) 40 MG tablet    Sig: Take 1 tablet (40 mg total) by mouth daily.    Dispense:  30 tablet    Refill:  5   ALPRAZolam (XANAX) 0.25 MG tablet    Sig: Take 1 tablet (0.25 mg total) by mouth 3 (three) times daily as needed for anxiety.    Dispense:  90 tablet    Refill:  2     Please discontinue previous orders of alprazolam, # of tablets changed to 90   DULoxetine (CYMBALTA) 60 MG capsule    Sig: Take 1 capsule (60 mg total) by mouth daily.    Dispense:  30 capsule    Refill:  5    Return in about 1 month (around 04/23/2023) for F/U, eval new med, Leelyn Jasinski PCP.   Total time spent:30 Minutes Time spent includes review of chart, medications, test results, and follow up plan with the patient.   [place] Controlled Substance Database was reviewed by me.  This patient was seen by Sallyanne Kuster, FNP-C in collaboration with Dr. Beverely Risen as a part of collaborative care agreement.   Arslan Kier R. Tedd Sias, MSN, FNP-C Internal medicine

## 2023-03-28 ENCOUNTER — Other Ambulatory Visit: Payer: Self-pay | Admitting: Nurse Practitioner

## 2023-03-28 DIAGNOSIS — J4489 Other specified chronic obstructive pulmonary disease: Secondary | ICD-10-CM

## 2023-03-31 ENCOUNTER — Other Ambulatory Visit: Payer: Self-pay

## 2023-03-31 DIAGNOSIS — J4489 Other specified chronic obstructive pulmonary disease: Secondary | ICD-10-CM

## 2023-03-31 MED ORDER — SPIRIVA HANDIHALER 18 MCG IN CAPS
18.0000 ug | ORAL_CAPSULE | Freq: Every day | RESPIRATORY_TRACT | 5 refills | Status: DC
Start: 2023-03-31 — End: 2023-04-29

## 2023-04-05 ENCOUNTER — Encounter: Payer: Self-pay | Admitting: Nurse Practitioner

## 2023-04-13 ENCOUNTER — Other Ambulatory Visit: Payer: Self-pay | Admitting: Nurse Practitioner

## 2023-04-13 DIAGNOSIS — F411 Generalized anxiety disorder: Secondary | ICD-10-CM

## 2023-04-13 DIAGNOSIS — F418 Other specified anxiety disorders: Secondary | ICD-10-CM

## 2023-04-23 ENCOUNTER — Ambulatory Visit: Payer: Medicare Other | Admitting: Nurse Practitioner

## 2023-04-29 ENCOUNTER — Encounter: Payer: Self-pay | Admitting: Nurse Practitioner

## 2023-04-29 ENCOUNTER — Ambulatory Visit (INDEPENDENT_AMBULATORY_CARE_PROVIDER_SITE_OTHER): Payer: Medicare Other | Admitting: Nurse Practitioner

## 2023-04-29 VITALS — BP 120/70 | HR 93 | Temp 98.0°F | Resp 16 | Ht 63.0 in | Wt 132.4 lb

## 2023-04-29 DIAGNOSIS — T502X5A Adverse effect of carbonic-anhydrase inhibitors, benzothiadiazides and other diuretics, initial encounter: Secondary | ICD-10-CM | POA: Diagnosis not present

## 2023-04-29 DIAGNOSIS — M159 Polyosteoarthritis, unspecified: Secondary | ICD-10-CM

## 2023-04-29 DIAGNOSIS — E876 Hypokalemia: Secondary | ICD-10-CM | POA: Diagnosis not present

## 2023-04-29 DIAGNOSIS — F17210 Nicotine dependence, cigarettes, uncomplicated: Secondary | ICD-10-CM | POA: Diagnosis not present

## 2023-04-29 DIAGNOSIS — J4489 Other specified chronic obstructive pulmonary disease: Secondary | ICD-10-CM | POA: Diagnosis not present

## 2023-04-29 DIAGNOSIS — K219 Gastro-esophageal reflux disease without esophagitis: Secondary | ICD-10-CM

## 2023-04-29 DIAGNOSIS — F418 Other specified anxiety disorders: Secondary | ICD-10-CM | POA: Diagnosis not present

## 2023-04-29 MED ORDER — POTASSIUM CHLORIDE ER 8 MEQ PO CPCR: 8.0000 meq | ORAL_CAPSULE | Freq: Every day | ORAL | 0 refills | Status: DC

## 2023-04-29 MED ORDER — CELECOXIB 100 MG PO CAPS
100.0000 mg | ORAL_CAPSULE | Freq: Two times a day (BID) | ORAL | 2 refills | Status: DC
Start: 2023-04-29 — End: 2023-07-21

## 2023-04-29 MED ORDER — SPIRIVA HANDIHALER 18 MCG IN CAPS
18.0000 ug | ORAL_CAPSULE | Freq: Every day | RESPIRATORY_TRACT | 5 refills | Status: DC
Start: 2023-04-29 — End: 2023-10-29

## 2023-04-29 NOTE — Progress Notes (Signed)
[facility] [address] [place], [place] [address]  Internal MEDICINE  Office Visit Note  Patient Name: Rachael Blankenship  [id number]  [id number]  Date of Service: 04/29/2023  Chief Complaint  Patient presents with   Follow-up   Gastroesophageal Reflux   Hyperlipidemia   Quality Metric Gaps    Mammogram    HPI Rachael Blankenship presents for a follow-up visit for depression, GERD and arthritis. Depression improving with increased dose of duloxetine GERD and abdominal pain improving with the addition of famotidine and continues to take omeprazole as well.  Need refills of potassium and spiriva Arthritis has been severe, no noticeable improvement with increased duloxetine dose.  Still smoking, having trouble quitting   Current Medication: Outpatient Encounter Medications as of 04/29/2023  Medication Sig   ALPRAZolam (XANAX) 0.25 MG tablet Take 1 tablet (0.25 mg total) by mouth 3 (three) times daily as needed for anxiety.   ascorbic acid (VITAMIN C) 500 MG tablet Take 500 mg by mouth daily.   aspirin 81 MG tablet Take 81 mg by mouth at bedtime.    celecoxib (CELEBREX) 100 MG capsule Take 1 capsule (100 mg total) by mouth 2 (two) times daily.   Cholecalciferol 25 MCG (1000 UT) tablet Take 1,000 Units by mouth daily.    Coenzyme Q10 (CO Q-10) 100 MG CAPS Take 200 mg by mouth daily.    DENTA 5000 PLUS 1.1 % CREA dental cream Take 1 application by mouth daily.   DULoxetine (CYMBALTA) 60 MG capsule Take 1 capsule (60 mg total) by mouth daily.   famotidine (PEPCID) 40 MG tablet Take 1 tablet (40 mg total) by mouth daily.   furosemide (LASIX) 40 MG tablet TAKE 1 TABLET BY MOUTH DAILY   Ipratropium-Albuterol (COMBIVENT RESPIMAT) 20-100 MCG/ACT AERS respimat INHALE 1 PUFF INTO THE LUNGS EVERY 6 HOURS AS NEEDED FOR WHEEZING OR SHORTNESS OF BREATH   ipratropium-albuterol (DUONEB) 0.5-2.5 (3) MG/3ML SOLN Use 1 vial via nebulizer every 4 t0 6 hrs   Magnesium 400 MG TABS Take 400 mg by mouth  daily.    mometasone (NASONEX) 50 MCG/ACT nasal spray Place 2 sprays into the nose daily.   mometasone-formoterol (DULERA) 200-5 MCG/ACT AERO Inhale 1 puff into the lungs 2 (two) times daily.   montelukast (SINGULAIR) 10 MG tablet Take 1 tablet (10 mg total) by mouth daily.   omeprazole (PRILOSEC) 40 MG capsule Take 1 capsule (40 mg total) by mouth 2 (two) times daily.   OXYGEN Inhale into the lungs. Continuous oxygen and NIV at night, 2 liters   pt uses AHP for oxygen supplies   revefenacin (YUPELRI) 175 MCG/3ML nebulizer solution Take 3 mLs (175 mcg total) by nebulization daily.   Roflumilast 250 MCG TABS TAKE 1 TABLET BY MOUTH EVERY DAY   simvastatin (ZOCOR) 20 MG tablet TAKE 1 TABLET(20 MG) BY MOUTH EVERY EVENING FOR CHOLESTEROL   sucralfate (CARAFATE) 1 g tablet TAKE 1 TABLET BY MOUTH FOUR TIMES DAILY AT BEDTIME WITH MEALS   vitamin E 180 MG (400 UNITS) capsule Take by mouth.   [DISCONTINUED] Potassium Chloride CR (MICRO-K) 8 MEQ CPCR capsule CR Take 1 capsule (8 mEq total) by mouth daily.   [DISCONTINUED] SPIRIVA HANDIHALER 18 MCG inhalation capsule Place 1 capsule (18 mcg total) into inhaler and inhale daily. i   Potassium Chloride CR (MICRO-K) 8 MEQ CPCR capsule CR Take 1 capsule (8 mEq total) by mouth daily.   SPIRIVA HANDIHALER 18 MCG inhalation capsule Place 1 capsule (18 mcg total) into  inhaler and inhale daily. i   No facility-administered encounter medications on file as of 04/29/2023.    Surgical History: Past Surgical History:  Procedure Laterality Date   COLONOSCOPY WITH PROPOFOL N/A 01/02/2023   Procedure: COLONOSCOPY WITH PROPOFOL;  Surgeon: Toney Reil, MD;  Location: [facility] ENDOSCOPY;  Service: Gastroenterology;  Laterality: N/A;   ESOPHAGOGASTRODUODENOSCOPY N/A 07/29/2018   Procedure: ESOPHAGOGASTRODUODENOSCOPY (EGD);  Surgeon: Toledo, Boykin Nearing, MD;  Location: [facility] ENDOSCOPY;  Service: Gastroenterology;  Laterality: N/A;   ESOPHAGOGASTRODUODENOSCOPY (EGD) WITH  PROPOFOL N/A 01/02/2023   Procedure: ESOPHAGOGASTRODUODENOSCOPY (EGD) WITH PROPOFOL;  Surgeon: Toney Reil, MD;  Location: [facility] ENDOSCOPY;  Service: Gastroenterology;  Laterality: N/A;   KIDNEY STONE SURGERY     TONSILLECTOMY      Medical History: Past Medical History:  Diagnosis Date   COPD (chronic obstructive pulmonary disease) (HCC)    Endometriosis    GERD (gastroesophageal reflux disease)    Hyperlipidemia    Kidney stones    Reactive airway disease 03/08/2014    Family History: Family History  Problem Relation Age of Onset   Hypertension Mother    Hyperlipidemia Mother    GER disease Mother    Heart attack Father        23 yrs ago   Hypertension Father    Parkinson's disease Father    GER disease Father     Social History   Socioeconomic History   Marital status: Married    Spouse name: Not on file   Number of children: Not on file   Years of education: Not on file   Highest education level: Not on file  Occupational History   Not on file  Tobacco Use   Smoking status: Every Day    Packs/day: 1    Types: Cigarettes    Start date: 11/09/2017    Last attempt to quit: 02/2022    Years since quitting: 1.1   Smokeless tobacco: Never   Tobacco comments:    6 cigarettes daily.  Vaping Use   Vaping Use: Former  Substance and Sexual Activity   Alcohol use: Yes    Comment: ocassionally    Drug use: No   Sexual activity: Not on file  Other Topics Concern   Not on file  Social History Narrative   Not on file   Social Determinants of Health   Financial Resource Strain: Not on file  Food Insecurity: Not on file  Transportation Needs: Not on file  Physical Activity: Not on file  Stress: Not on file  Social Connections: Not on file  Intimate Partner Violence: Not on file      Review of Systems  Constitutional:  Positive for fatigue. Negative for chills and unexpected weight change.  HENT:  Negative for congestion, postnasal drip, rhinorrhea,  sneezing and sore throat.   Eyes:  Negative for redness.  Respiratory:  Positive for cough, shortness of breath and wheezing. Negative for chest tightness.   Cardiovascular: Negative.  Negative for chest pain and palpitations.  Gastrointestinal:  Positive for abdominal distention and abdominal pain. Negative for constipation, diarrhea, nausea and vomiting.  Genitourinary:  Negative for dysuria and frequency.  Musculoskeletal:  Negative for arthralgias, back pain, joint swelling and neck pain.  Skin:  Negative for rash.  Neurological: Negative.  Negative for tremors and numbness.  Hematological:  Negative for adenopathy. Does not bruise/bleed easily.  Psychiatric/Behavioral:  Positive for behavioral problems (Depression) and sleep disturbance. Negative for suicidal ideas. The patient is nervous/anxious.  Vital Signs: BP 120/70   Pulse 93   Temp 98 F (36.7 C)   Resp 16   Ht 5\' 3"  (1.6 m)   Wt 132 lb 6.4 oz (60.1 kg)   SpO2 93% Comment: Patient is on 4 liters of O2.  BMI 23.45 kg/m    Physical Exam Vitals reviewed.  Constitutional:      General: She is not in acute distress.    Appearance: Normal appearance. She is normal weight. She is not ill-appearing.  HENT:     Head: Normocephalic and atraumatic.  Eyes:     Pupils: Pupils are equal, round, and reactive to light.  Cardiovascular:     Rate and Rhythm: Normal rate and regular rhythm.     Heart sounds: Normal heart sounds, S1 normal and S2 normal. No murmur heard. Pulmonary:     Effort: Pulmonary effort is normal. No accessory muscle usage or respiratory distress.     Breath sounds: Normal air entry. Wheezing (intermittent) present.  Musculoskeletal:     Right lower leg: No edema.     Left lower leg: No edema.  Neurological:     Mental Status: She is alert and oriented to person, place, and time.  Psychiatric:        Mood and Affect: Mood is anxious. Affect is tearful.        Behavior: Behavior normal. Behavior is  cooperative.        Assessment/Plan: 1. GERD without esophagitis Improved, continue famotidine and omeprazole as prescribed  2. Primary osteoarthritis involving multiple joints Start celebrex 100 mg BID as prescribed, follow up in 1 month - celecoxib (CELEBREX) 100 MG capsule; Take 1 capsule (100 mg total) by mouth 2 (two) times daily.  Dispense: 60 capsule; Refill: 2  3. Diuretic-induced hypokalemia Continue potassium supplement as prescribed - Potassium Chloride CR (MICRO-K) 8 MEQ CPCR capsule CR; Take 1 capsule (8 mEq total) by mouth daily.  Dispense: 90 capsule; Refill: 0  4. Obstructive chronic bronchitis without exacerbation Continue spiriva as prescribed.  - SPIRIVA HANDIHALER 18 MCG inhalation capsule; Place 1 capsule (18 mcg total) into inhaler and inhale daily. i  Dispense: 30 capsule; Refill: 5  5. Cigarette smoker Continues to smoke, not ready to quit, discussed finding motivation to quit smoking  6. Depression with anxiety Improving with increased duloxetine dose. Continue duloxetine as prescribed.    General Counseling: Rachael Blankenship understanding of the findings of todays visit and agrees with plan of treatment. I have discussed any further diagnostic evaluation that may be needed or ordered today. We also reviewed her medications today. she has been encouraged to call the office with any questions or concerns that should arise related to todays visit.    No orders of the defined types were placed in this encounter.   Meds ordered this encounter  Medications   celecoxib (CELEBREX) 100 MG capsule    Sig: Take 1 capsule (100 mg total) by mouth 2 (two) times daily.    Dispense:  60 capsule    Refill:  2   Potassium Chloride CR (MICRO-K) 8 MEQ CPCR capsule CR    Sig: Take 1 capsule (8 mEq total) by mouth daily.    Dispense:  90 capsule    Refill:  0   SPIRIVA HANDIHALER 18 MCG inhalation capsule    Sig: Place 1 capsule (18 mcg total) into inhaler and  inhale daily. i    Dispense:  30 capsule    Refill:  5  Return in about 6 weeks (around 06/10/2023) for F/U alprazolam refill, Shizue Kaseman PCP.   Total time spent:30 Minutes Time spent includes review of chart, medications, test results, and follow up plan with the patient.    Controlled Substance Database was reviewed by me.  This patient was seen by Sallyanne Kuster, FNP-C in collaboration with Dr. Beverely Risen as a part of collaborative care agreement.   Cillian Gwinner R. Tedd Sias, MSN, FNP-C Internal medicine

## 2023-05-02 ENCOUNTER — Other Ambulatory Visit: Payer: Self-pay

## 2023-05-02 DIAGNOSIS — J449 Chronic obstructive pulmonary disease, unspecified: Secondary | ICD-10-CM

## 2023-05-02 MED ORDER — COMBIVENT RESPIMAT 20-100 MCG/ACT IN AERS
INHALATION_SPRAY | RESPIRATORY_TRACT | 3 refills | Status: DC
Start: 2023-05-02 — End: 2023-10-31

## 2023-05-10 ENCOUNTER — Other Ambulatory Visit: Payer: Self-pay | Admitting: Nurse Practitioner

## 2023-05-10 DIAGNOSIS — E876 Hypokalemia: Secondary | ICD-10-CM

## 2023-05-13 ENCOUNTER — Other Ambulatory Visit: Payer: Self-pay | Admitting: Nurse Practitioner

## 2023-05-13 DIAGNOSIS — E876 Hypokalemia: Secondary | ICD-10-CM

## 2023-05-13 NOTE — Telephone Encounter (Signed)
Please review

## 2023-05-15 ENCOUNTER — Other Ambulatory Visit: Payer: Self-pay | Admitting: Nurse Practitioner

## 2023-05-15 DIAGNOSIS — E782 Mixed hyperlipidemia: Secondary | ICD-10-CM

## 2023-05-22 ENCOUNTER — Other Ambulatory Visit: Payer: Self-pay | Admitting: Nurse Practitioner

## 2023-05-22 DIAGNOSIS — J4489 Other specified chronic obstructive pulmonary disease: Secondary | ICD-10-CM

## 2023-06-10 ENCOUNTER — Ambulatory Visit: Payer: Medicare Other | Admitting: Nurse Practitioner

## 2023-06-12 ENCOUNTER — Other Ambulatory Visit: Payer: Self-pay | Admitting: Nurse Practitioner

## 2023-06-12 DIAGNOSIS — K219 Gastro-esophageal reflux disease without esophagitis: Secondary | ICD-10-CM

## 2023-06-19 ENCOUNTER — Other Ambulatory Visit: Payer: Self-pay | Admitting: Nurse Practitioner

## 2023-06-19 DIAGNOSIS — R109 Unspecified abdominal pain: Secondary | ICD-10-CM

## 2023-07-02 ENCOUNTER — Encounter: Payer: Self-pay | Admitting: Nurse Practitioner

## 2023-07-02 ENCOUNTER — Ambulatory Visit (INDEPENDENT_AMBULATORY_CARE_PROVIDER_SITE_OTHER): Payer: Medicare Other | Admitting: Nurse Practitioner

## 2023-07-02 VITALS — BP 122/74 | HR 95 | Temp 98.1°F | Resp 16 | Ht 63.0 in | Wt 131.8 lb

## 2023-07-02 DIAGNOSIS — G251 Drug-induced tremor: Secondary | ICD-10-CM

## 2023-07-02 DIAGNOSIS — K2930 Chronic superficial gastritis without bleeding: Secondary | ICD-10-CM

## 2023-07-02 DIAGNOSIS — J449 Chronic obstructive pulmonary disease, unspecified: Secondary | ICD-10-CM

## 2023-07-02 DIAGNOSIS — J4489 Other specified chronic obstructive pulmonary disease: Secondary | ICD-10-CM

## 2023-07-02 DIAGNOSIS — F17210 Nicotine dependence, cigarettes, uncomplicated: Secondary | ICD-10-CM

## 2023-07-02 DIAGNOSIS — F418 Other specified anxiety disorders: Secondary | ICD-10-CM

## 2023-07-02 DIAGNOSIS — J9611 Chronic respiratory failure with hypoxia: Secondary | ICD-10-CM

## 2023-07-02 DIAGNOSIS — M150 Primary generalized (osteo)arthritis: Secondary | ICD-10-CM

## 2023-07-02 DIAGNOSIS — M159 Polyosteoarthritis, unspecified: Secondary | ICD-10-CM | POA: Diagnosis not present

## 2023-07-02 MED ORDER — ALPRAZOLAM 0.25 MG PO TABS
0.2500 mg | ORAL_TABLET | Freq: Three times a day (TID) | ORAL | 2 refills | Status: DC | PRN
Start: 2023-07-02 — End: 2023-09-11

## 2023-07-02 MED ORDER — FAMOTIDINE 40 MG PO TABS: 40.0000 mg | ORAL_TABLET | Freq: Two times a day (BID) | ORAL | 2 refills | Status: DC

## 2023-07-02 NOTE — Progress Notes (Signed)
[facility] [address] [place], [place] [address]  Internal MEDICINE  Office Visit Note  Patient Name: Rachael Blankenship  [id number]  [id number]  Date of Service: 07/02/2023  Chief Complaint  Patient presents with   Follow-up    HPI Rachael Blankenship presents for a follow-up visit for COPD, smoking, anxiety, tremor, GERD.  COPD with hypoxia -- using 4 LPM supplemental oxygen, with COPD, she may be retaining CO2 which was discussed with the patient.  Smoking -- she did pick up smoking but is working on quitting again Anxiety -- increased anxiety often due to her mother who is not currently living with them now so this has been a little better.  GERD -- wants to increase famotidine d to twice daily.  Right upper arm pain -- most likely arthritis, takes pain medication chronically.  Tremor at rest and with activity in hands bilateral. --worried about parkinsons but is taking multiple medications that can cause a tremor as a side effect or can cause a person to be jittery. She is also still smoking which can cause jitteriness.     Current Medication: Outpatient Encounter Medications as of 07/02/2023  Medication Sig   ascorbic acid (VITAMIN C) 500 MG tablet Take 500 mg by mouth daily.   aspirin 81 MG tablet Take 81 mg by mouth at bedtime.    celecoxib (CELEBREX) 100 MG capsule Take 1 capsule (100 mg total) by mouth 2 (two) times daily.   Cholecalciferol 25 MCG (1000 UT) tablet Take 1,000 Units by mouth daily.    Coenzyme Q10 (CO Q-10) 100 MG CAPS Take 200 mg by mouth daily.    DENTA 5000 PLUS 1.1 % CREA dental cream Take 1 application by mouth daily.   DULERA 200-5 MCG/ACT AERO INHALE 1 PUFF INTO THE LUNGS TWICE DAILY   DULoxetine (CYMBALTA) 60 MG capsule Take 1 capsule (60 mg total) by mouth daily.   furosemide (LASIX) 40 MG tablet TAKE 1 TABLET BY MOUTH DAILY   Ipratropium-Albuterol (COMBIVENT RESPIMAT) 20-100 MCG/ACT AERS respimat INHALE 1 PUFF INTO THE LUNGS EVERY 6 HOURS AS  NEEDED FOR WHEEZING OR SHORTNESS OF BREATH   ipratropium-albuterol (DUONEB) 0.5-2.5 (3) MG/3ML SOLN Use 1 vial via nebulizer every 4 t0 6 hrs   Magnesium 400 MG TABS Take 400 mg by mouth daily.    mometasone (NASONEX) 50 MCG/ACT nasal spray Place 2 sprays into the nose daily.   montelukast (SINGULAIR) 10 MG tablet Take 1 tablet (10 mg total) by mouth daily.   omeprazole (PRILOSEC) 40 MG capsule Take 1 capsule (40 mg total) by mouth 2 (two) times daily.   OXYGEN Inhale into the lungs. Continuous oxygen and NIV at night, 2 liters   pt uses AHP for oxygen supplies   potassium chloride (KLOR-CON) 8 MEQ tablet Take 1 capsule (8 mEq total) by mouth daily.   Potassium Chloride CR (MICRO-K) 8 MEQ CPCR capsule CR Take 1 capsule (8 mEq total) by mouth daily.   revefenacin (YUPELRI) 175 MCG/3ML nebulizer solution Take 3 mLs (175 mcg total) by nebulization daily.   Roflumilast 250 MCG TABS TAKE 1 TABLET BY MOUTH EVERY DAY   simvastatin (ZOCOR) 20 MG tablet TAKE 1 TABLET(20 MG) BY MOUTH EVERY EVENING FOR CHOLESTEROL   SPIRIVA HANDIHALER 18 MCG inhalation capsule Place 1 capsule (18 mcg total) into inhaler and inhale daily. i   sucralfate (CARAFATE) 1 g tablet TAKE 1 TABLET BY MOUTH FOUR TIMES DAILY AT BEDTIME WITH MEALS   vitamin E 180 MG (  400 UNITS) capsule Take by mouth.   [DISCONTINUED] ALPRAZolam (XANAX) 0.25 MG tablet Take 1 tablet (0.25 mg total) by mouth 3 (three) times daily as needed for anxiety.   [DISCONTINUED] famotidine (PEPCID) 40 MG tablet Take 1 tablet (40 mg total) by mouth daily.   ALPRAZolam (XANAX) 0.25 MG tablet Take 1 tablet (0.25 mg total) by mouth 3 (three) times daily as needed for anxiety.   famotidine (PEPCID) 40 MG tablet Take 1 tablet (40 mg total) by mouth 2 (two) times daily.   No facility-administered encounter medications on file as of 07/02/2023.    Surgical History: Past Surgical History:  Procedure Laterality Date   COLONOSCOPY WITH PROPOFOL N/A 01/02/2023   Procedure:  COLONOSCOPY WITH PROPOFOL;  Surgeon: Toney Reil, MD;  Location: [facility] ENDOSCOPY;  Service: Gastroenterology;  Laterality: N/A;   ESOPHAGOGASTRODUODENOSCOPY N/A 07/29/2018   Procedure: ESOPHAGOGASTRODUODENOSCOPY (EGD);  Surgeon: Toledo, Boykin Nearing, MD;  Location: [facility] ENDOSCOPY;  Service: Gastroenterology;  Laterality: N/A;   ESOPHAGOGASTRODUODENOSCOPY (EGD) WITH PROPOFOL N/A 01/02/2023   Procedure: ESOPHAGOGASTRODUODENOSCOPY (EGD) WITH PROPOFOL;  Surgeon: Toney Reil, MD;  Location: [facility] ENDOSCOPY;  Service: Gastroenterology;  Laterality: N/A;   KIDNEY STONE SURGERY     TONSILLECTOMY      Medical History: Past Medical History:  Diagnosis Date   COPD (chronic obstructive pulmonary disease) (HCC)    Endometriosis    GERD (gastroesophageal reflux disease)    Hyperlipidemia    Kidney stones    Reactive airway disease 03/08/2014    Family History: Family History  Problem Relation Age of Onset   Hypertension Mother    Hyperlipidemia Mother    GER disease Mother    Heart attack Father        23 yrs ago   Hypertension Father    Parkinson's disease Father    GER disease Father     Social History   Socioeconomic History   Marital status: Married    Spouse name: Not on file   Number of children: Not on file   Years of education: Not on file   Highest education level: Not on file  Occupational History   Not on file  Tobacco Use   Smoking status: Every Day    Current packs/day: 0.00    Average packs/day: 1 pack/day for 4.3 years (4.3 ttl pk-yrs)    Types: Cigarettes    Start date: 11/09/2017    Last attempt to quit: 02/2022    Years since quitting: 1.3   Smokeless tobacco: Never   Tobacco comments:    6 cigarettes daily.  Vaping Use   Vaping status: Former  Substance and Sexual Activity   Alcohol use: Yes    Comment: ocassionally    Drug use: No   Sexual activity: Not on file  Other Topics Concern   Not on file  Social History Narrative   Not on file    Social Determinants of Health   Financial Resource Strain: Not on file  Food Insecurity: Not on file  Transportation Needs: Not on file  Physical Activity: Not on file  Stress: Not on file  Social Connections: Not on file  Intimate Partner Violence: Not on file      Review of Systems  Constitutional:  Positive for fatigue. Negative for chills and unexpected weight change.  HENT:  Negative for congestion, postnasal drip, rhinorrhea, sneezing and sore throat.   Eyes:  Negative for redness.  Respiratory:  Positive for cough, shortness of breath and wheezing. Negative for  chest tightness.   Cardiovascular: Negative.  Negative for chest pain and palpitations.  Gastrointestinal:  Positive for abdominal distention and abdominal pain. Negative for constipation, diarrhea, nausea and vomiting.  Genitourinary:  Negative for dysuria and frequency.  Musculoskeletal:  Negative for arthralgias, back pain, joint swelling and neck pain.  Skin:  Negative for rash.  Neurological: Negative.  Negative for tremors and numbness.  Hematological:  Negative for adenopathy. Does not bruise/bleed easily.  Psychiatric/Behavioral:  Positive for behavioral problems (Depression) and sleep disturbance. Negative for suicidal ideas. The patient is nervous/anxious.     Vital Signs: BP 122/74   Pulse 95   Temp 98.1 F (36.7 C)   Resp 16   Ht 5\' 3"  (1.6 m)   Wt 131 lb 12.8 oz (59.8 kg)   SpO2 97% Comment: 4L  BMI 23.35 kg/m    Physical Exam Vitals reviewed.  Constitutional:      General: She is not in acute distress.    Appearance: Normal appearance. She is normal weight. She is not ill-appearing.  HENT:     Head: Normocephalic and atraumatic.  Eyes:     Pupils: Pupils are equal, round, and reactive to light.  Cardiovascular:     Rate and Rhythm: Normal rate and regular rhythm.     Heart sounds: Normal heart sounds, S1 normal and S2 normal. No murmur heard. Pulmonary:     Effort: Pulmonary effort  is normal. No accessory muscle usage or respiratory distress.     Breath sounds: Normal air entry. Wheezing (intermittent) present.  Musculoskeletal:     Right lower leg: No edema.     Left lower leg: No edema.  Neurological:     Mental Status: She is alert and oriented to person, place, and time.  Psychiatric:        Mood and Affect: Mood is anxious. Affect is tearful.        Behavior: Behavior normal. Behavior is cooperative.        Assessment/Plan: 1. COPD with hypoxia (HCC) Continue with current treatments and supplemental oxygen. Will have venous blood gas drawn to measure carbon dioxide level to determine if we need to decrease her oxygen down to 3 LPM. She will work on being compliant with the once daily yupelri nebulizer treatment to determine if it is truly helping since she has not been consistent with it yet. We also discussed trying Ohtuvayre if yupelri is not helping or adding it to her regimen if she can become consistent with using her nebulizer every day  2. Chronic respiratory failure with hypoxia (HCC) Continue supplemental oxygen use. We will periodically monitor blood gases to determine if we need to drop down her oxygen so we do no cause her to build up carbon dioxide. Continue current medications, and we will continue to periodically review effectiveness and compliance to determine if dose adjustments or changes need to be made.   3. Chronic superficial gastritis without bleeding Omeprazole twice daily with famotidine has been helping, increase famotidine to twice daily, reassess at next office visit.  - famotidine (PEPCID) 40 MG tablet; Take 1 tablet (40 mg total) by mouth 2 (two) times daily.  Dispense: 180 tablet; Refill: 2  4. Tremor due to multiple drugs Discussed possible causes of tremor with patient and spouse, will continue to monitor at home and will let me know if the tremor worsens or any additional symptoms develop. We will consider a neurology consult  in the future to evaluate for parkinsons disease if  necessary.   5. Primary osteoarthritis involving multiple joints Continue chronic pain medication as prescribed.   6. Depression with anxiety May take prn alprazolam as prescribed.  - ALPRAZolam (XANAX) 0.25 MG tablet; Take 1 tablet (0.25 mg total) by mouth 3 (three) times daily as needed for anxiety.  Dispense: 90 tablet; Refill: 2  7. Cigarette smoker Motivated to quit, working on this on her own with her spouse's support.    General Counseling: Rachael Blankenship understanding of the findings of todays visit and agrees with plan of treatment. I have discussed any further diagnostic evaluation that may be needed or ordered today. We also reviewed her medications today. she has been encouraged to call the office with any questions or concerns that should arise related to todays visit.    No orders of the defined types were placed in this encounter.   Meds ordered this encounter  Medications   famotidine (PEPCID) 40 MG tablet    Sig: Take 1 tablet (40 mg total) by mouth 2 (two) times daily.    Dispense:  180 tablet    Refill:  2   ALPRAZolam (XANAX) 0.25 MG tablet    Sig: Take 1 tablet (0.25 mg total) by mouth 3 (three) times daily as needed for anxiety.    Dispense:  90 tablet    Refill:  2    Please discontinue previous orders of alprazolam, # of tablets changed to 90    Return in about 4 weeks (around 07/30/2023) for F/U, eval new med, Kolbie Clarkston PCP.   Total time spent:30 Minutes Time spent includes review of chart, medications, test results, and follow up plan with the patient.   [place] Controlled Substance Database was reviewed by me.  This patient was seen by Sallyanne Kuster, FNP-C in collaboration with Dr. Beverely Risen as a part of collaborative care agreement.   Chauna Osoria R. Tedd Sias, MSN, FNP-C Internal medicine

## 2023-07-03 ENCOUNTER — Other Ambulatory Visit: Payer: Self-pay | Admitting: Nurse Practitioner

## 2023-07-03 ENCOUNTER — Other Ambulatory Visit
Admission: RE | Admit: 2023-07-03 | Discharge: 2023-07-03 | Disposition: A | Discharge status: Home / Self Care | Payer: Medicare Other | Source: Ambulatory Visit | Attending: Nurse Practitioner | Admitting: Nurse Practitioner

## 2023-07-03 ENCOUNTER — Ambulatory Visit: Payer: Medicare Other

## 2023-07-03 DIAGNOSIS — J449 Chronic obstructive pulmonary disease, unspecified: Secondary | ICD-10-CM | POA: Diagnosis not present

## 2023-07-03 DIAGNOSIS — J9611 Chronic respiratory failure with hypoxia: Secondary | ICD-10-CM | POA: Diagnosis not present

## 2023-07-03 LAB — BLOOD GAS, VENOUS
Acid-Base Excess: 14 mmol/L — ABNORMAL HIGH (ref 0.0–2.0)
Bicarbonate: 41.5 mmol/L — ABNORMAL HIGH (ref 20.0–28.0)
O2 Saturation: 37.6 %
Patient temperature: 37
pCO2, Ven: 64 mmHg — ABNORMAL HIGH (ref 44–60)
pH, Ven: 7.42 (ref 7.25–7.43)

## 2023-07-17 ENCOUNTER — Encounter: Payer: Self-pay | Admitting: Nurse Practitioner

## 2023-07-20 ENCOUNTER — Other Ambulatory Visit: Payer: Self-pay | Admitting: Nurse Practitioner

## 2023-07-20 DIAGNOSIS — M159 Polyosteoarthritis, unspecified: Secondary | ICD-10-CM

## 2023-07-25 ENCOUNTER — Other Ambulatory Visit: Payer: Self-pay | Admitting: Nurse Practitioner

## 2023-07-25 DIAGNOSIS — T502X5A Adverse effect of carbonic-anhydrase inhibitors, benzothiadiazides and other diuretics, initial encounter: Secondary | ICD-10-CM

## 2023-07-29 ENCOUNTER — Encounter: Payer: Self-pay | Admitting: Internal Medicine

## 2023-07-29 ENCOUNTER — Ambulatory Visit (INDEPENDENT_AMBULATORY_CARE_PROVIDER_SITE_OTHER): Payer: Medicare Other | Admitting: Internal Medicine

## 2023-07-29 VITALS — BP 118/70 | HR 103 | Temp 98.1°F | Resp 16 | Ht 63.0 in | Wt 128.4 lb

## 2023-07-29 DIAGNOSIS — F17210 Nicotine dependence, cigarettes, uncomplicated: Secondary | ICD-10-CM | POA: Diagnosis not present

## 2023-07-29 DIAGNOSIS — J9611 Chronic respiratory failure with hypoxia: Secondary | ICD-10-CM | POA: Diagnosis not present

## 2023-07-29 DIAGNOSIS — J449 Chronic obstructive pulmonary disease, unspecified: Secondary | ICD-10-CM | POA: Diagnosis not present

## 2023-07-29 NOTE — Patient Instructions (Signed)
Chronic Obstructive Pulmonary Disease  Chronic obstructive pulmonary disease (COPD) is a long-term (chronic) lung problem. When you have COPD, it is hard for air to get in and out of your lungs. Usually the condition gets worse over time, and your lungs will never return to normal. There are things you can do to keep yourself as healthy as possible. What are the causes? Smoking. This is the most common cause. Certain genes passed from parent to child (inherited). What increases the risk? Being exposed to secondhand smoke from cigarettes, pipes, or cigars. Being exposed to chemicals and other irritants, such as fumes and dust in the work environment. Having chronic lung conditions or infections. What are the signs or symptoms? Shortness of breath, especially during physical activity. A long-term cough with a large amount of thick mucus. Sometimes, the cough may not have any mucus (dry cough). Wheezing. Breathing quickly. Skin that looks gray or blue, especially in the fingers, toes, or lips. Feeling tired (fatigue). Weight loss. Chest tightness. Having infections often. Episodes when breathing symptoms become much worse (exacerbations). At the later stages of this disease, you may have swelling in the ankles, feet, or legs. How is this treated? Taking medicines. Quitting smoking, if you smoke. Rehabilitation. This includes steps to make your body work better. It may involve a team of specialists. Doing exercises. Making changes to your diet. Using oxygen. Lung surgery. Lung transplant. Comfort measures (palliative care). Follow these instructions at home: Medicines Take over-the-counter and prescription medicines only as told by your doctor. Talk to your doctor before taking any cough or allergy medicines. You may need to avoid medicines that cause your lungs to be dry. Lifestyle If you smoke, stop smoking. Smoking makes the problem worse. Do not smoke or use any products that  contain nicotine or tobacco. If you need help quitting, ask your doctor. Avoid being around things that make your breathing worse. This may include smoke, chemicals, and fumes. Stay active, but remember to rest as well. Learn and use tips on how to manage stress and control your breathing. Make sure you get enough sleep. Most adults need at least 7 hours of sleep every night. Eat healthy foods. Eat smaller meals more often. Rest before meals. Controlled breathing Learn and use tips on how to control your breathing as told by your doctor. Try: Breathing in (inhaling) through your nose for 1 second. Then, pucker your lips and breath out (exhale) through your lips for 2 seconds. Putting one hand on your belly (abdomen). Breathe in slowly through your nose for 1 second. Your hand on your belly should move out. Pucker your lips and breathe out slowly through your lips. Your hand on your belly should move in as you breathe out.  Controlled coughing Learn and use controlled coughing to clear mucus from your lungs. Follow these steps: Lean your head a little forward. Breathe in deeply. Try to hold your breath for 3 seconds. Keep your mouth slightly open while coughing 2 times. Spit any mucus out into a tissue. Rest and do the steps again 1 or 2 times as needed. General instructions Make sure you get all the shots (vaccines) that your doctor recommends. Ask your doctor about a flu shot and a pneumonia shot. Use oxygen therapy and pulmonary rehabilitation if told by your doctor. If you need home oxygen therapy, ask your doctor if you should buy a tool to measure your oxygen level (oximeter). Make a COPD action plan with your doctor. This helps you   to know what to do if you feel worse than usual. Manage any other conditions you have as told by your doctor. Avoid going outside when it is very hot, cold, or humid. Avoid people who have a sickness you can catch (contagious). Keep all follow-up  visits. Contact a doctor if: You cough up more mucus than usual. There is a change in the color or thickness of the mucus. It is harder to breathe than usual. Your breathing is faster than usual. You have trouble sleeping. You need to use your medicines more often than usual. You have trouble doing your normal activities such as getting dressed or walking around the house. Get help right away if: You have shortness of breath while resting. You have shortness of breath that stops you from: Being able to talk. Doing normal activities. Your chest hurts for longer than 5 minutes. Your skin color is more blue than usual. Your pulse oximeter shows that you have low oxygen for longer than 5 minutes. You have a fever. You feel too tired to breathe normally. These symptoms may represent a serious problem that is an emergency. Do not wait to see if the symptoms will go away. Get medical help right away. Call your local emergency services (911 in the [place] Do not drive yourself to the hospital. Summary Chronic obstructive pulmonary disease (COPD) is a long-term lung problem. The way your lungs work will never return to normal. Usually the condition gets worse over time. There are things you can do to keep yourself as healthy as possible. Take over-the-counter and prescription medicines only as told by your doctor. If you smoke, stop. Smoking makes the problem worse. This information is not intended to replace advice given to you by your health care provider. Make sure you discuss any questions you have with your health care provider. Document Revised: 09/18/2020 Document Reviewed: 09/19/2020 Elsevier Patient Education  2024 [employer]vier Inc.  

## 2023-07-29 NOTE — Progress Notes (Signed)
[facility] [address] [place], [place] [address]  Pulmonary Sleep Medicine   Office Visit Note  Patient Name: Rachael Blankenship DOB: 1955-11-28 MRN [id number]  Date of Service: 07/29/2023  Complaints/HPI: She is still smoking. She states she is afraid to quit. She had blood work done. ABG was venous and it shows that her pCO2 was 64. She is on oxygen therapy and is using it today. We hada  lengthy discussion regarding smoking cessation.   Office Spirometry Results:     ROS  General: (-) fever, (-) chills, (-) night sweats, (-) weakness Skin: (-) rashes, (-) itching,. Eyes: (-) visual changes, (-) redness, (-) itching. Nose and Sinuses: (-) nasal stuffiness or itchiness, (-) postnasal drip, (-) nosebleeds, (-) sinus trouble. Mouth and Throat: (-) sore throat, (-) hoarseness. Neck: (-) swollen glands, (-) enlarged thyroid, (-) neck pain. Respiratory: + cough, (-) bloody sputum, + shortness of breath, + wheezing. Cardiovascular: - ankle swelling, (-) chest pain. Lymphatic: (-) lymph node enlargement. Neurologic: (-) numbness, (-) tingling. Psychiatric: (-) anxiety, (-) depression   Current Medication: Outpatient Encounter Medications as of 07/29/2023  Medication Sig   ALPRAZolam (XANAX) 0.25 MG tablet Take 1 tablet (0.25 mg total) by mouth 3 (three) times daily as needed for anxiety.   ascorbic acid (VITAMIN C) 500 MG tablet Take 500 mg by mouth daily.   aspirin 81 MG tablet Take 81 mg by mouth at bedtime.    celecoxib (CELEBREX) 100 MG capsule Take 1 capsule (100 mg total) by mouth 2 (two) times daily.   Cholecalciferol 25 MCG (1000 UT) tablet Take 1,000 Units by mouth daily.    Coenzyme Q10 (CO Q-10) 100 MG CAPS Take 200 mg by mouth daily.    DENTA 5000 PLUS 1.1 % CREA dental cream Take 1 application by mouth daily.   DULERA 200-5 MCG/ACT AERO INHALE 1 PUFF INTO THE LUNGS TWICE DAILY   DULoxetine (CYMBALTA) 60 MG capsule Take 1 capsule (60 mg total) by mouth  daily.   famotidine (PEPCID) 40 MG tablet Take 1 tablet (40 mg total) by mouth 2 (two) times daily.   furosemide (LASIX) 40 MG tablet TAKE 1 TABLET BY MOUTH DAILY   Ipratropium-Albuterol (COMBIVENT RESPIMAT) 20-100 MCG/ACT AERS respimat INHALE 1 PUFF INTO THE LUNGS EVERY 6 HOURS AS NEEDED FOR WHEEZING OR SHORTNESS OF BREATH   ipratropium-albuterol (DUONEB) 0.5-2.5 (3) MG/3ML SOLN Use 1 vial via nebulizer every 4 t0 6 hrs   Magnesium 400 MG TABS Take 400 mg by mouth daily.    mometasone (NASONEX) 50 MCG/ACT nasal spray Place 2 sprays into the nose daily.   montelukast (SINGULAIR) 10 MG tablet Take 1 tablet (10 mg total) by mouth daily.   omeprazole (PRILOSEC) 40 MG capsule Take 1 capsule (40 mg total) by mouth 2 (two) times daily.   OXYGEN Inhale into the lungs. Continuous oxygen and NIV at night, 2 liters   pt uses [facility] for oxygen supplies   potassium chloride (KLOR-CON) 8 MEQ tablet Take 1 capsule (8 mEq total) by mouth daily.   Potassium Chloride CR (MICRO-K) 8 MEQ CPCR capsule CR Take 1 capsule (8 mEq total) by mouth daily.   revefenacin (YUPELRI) 175 MCG/3ML nebulizer solution Take 3 mLs (175 mcg total) by nebulization daily.   Roflumilast 250 MCG TABS TAKE 1 TABLET BY MOUTH EVERY DAY   simvastatin (ZOCOR) 20 MG tablet TAKE 1 TABLET(20 MG) BY MOUTH EVERY EVENING FOR CHOLESTEROL   SPIRIVA HANDIHALER 18 MCG inhalation capsule Place  1 capsule (18 mcg total) into inhaler and inhale daily. i   sucralfate (CARAFATE) 1 g tablet TAKE 1 TABLET BY MOUTH FOUR TIMES DAILY AT BEDTIME WITH MEALS   vitamin E 180 MG (400 UNITS) capsule Take by mouth.   No facility-administered encounter medications on file as of 07/29/2023.    Surgical History: Past Surgical History:  Procedure Laterality Date   COLONOSCOPY WITH PROPOFOL N/A 01/02/2023   Procedure: COLONOSCOPY WITH PROPOFOL;  Surgeon: Toney Reil, MD;  Location: [facility] ENDOSCOPY;  Service: Gastroenterology;  Laterality: N/A;    ESOPHAGOGASTRODUODENOSCOPY N/A 07/29/2018   Procedure: ESOPHAGOGASTRODUODENOSCOPY (EGD);  Surgeon: Toledo, Boykin Nearing, MD;  Location: [facility] ENDOSCOPY;  Service: Gastroenterology;  Laterality: N/A;   ESOPHAGOGASTRODUODENOSCOPY (EGD) WITH PROPOFOL N/A 01/02/2023   Procedure: ESOPHAGOGASTRODUODENOSCOPY (EGD) WITH PROPOFOL;  Surgeon: Toney Reil, MD;  Location: [facility] ENDOSCOPY;  Service: Gastroenterology;  Laterality: N/A;   KIDNEY STONE SURGERY     TONSILLECTOMY      Medical History: Past Medical History:  Diagnosis Date   COPD (chronic obstructive pulmonary disease) (HCC)    Endometriosis    GERD (gastroesophageal reflux disease)    Hyperlipidemia    Kidney stones    Reactive airway disease 03/08/2014    Family History: Family History  Problem Relation Age of Onset   Hypertension Mother    Hyperlipidemia Mother    GER disease Mother    Heart attack Father        23 yrs ago   Hypertension Father    Parkinson's disease Father    GER disease Father     Social History: Social History   Socioeconomic History   Marital status: Married    Spouse name: Not on file   Number of children: Not on file   Years of education: Not on file   Highest education level: Not on file  Occupational History   Not on file  Tobacco Use   Smoking status: Every Day    Current packs/day: 0.00    Average packs/day: 1 pack/day for 4.3 years (4.3 ttl pk-yrs)    Types: Cigarettes    Start date: 11/09/2017    Last attempt to quit: 02/2022    Years since quitting: 1.4   Smokeless tobacco: Never   Tobacco comments:    6 cigarettes daily.  Vaping Use   Vaping status: Former  Substance and Sexual Activity   Alcohol use: Yes    Comment: ocassionally    Drug use: No   Sexual activity: Not on file  Other Topics Concern   Not on file  Social History Narrative   Not on file   Social Determinants of Health   Financial Resource Strain: Not on file  Food Insecurity: Not on file  Transportation  Needs: Not on file  Physical Activity: Not on file  Stress: Not on file  Social Connections: Not on file  Intimate Partner Violence: Not on file    Vital Signs: Blood pressure 118/70, pulse (!) 103, temperature 98.1 F (36.7 C), resp. rate 16, height 5\' 3"  (1.6 m), weight 128 lb 6.4 oz (58.2 kg), SpO2 (!) 88%.  Examination: General Appearance: The patient is well-developed, well-nourished, and in no distress. Skin: Gross inspection of skin unremarkable. Head: normocephalic, no gross deformities. Eyes: no gross deformities noted. ENT: ears appear grossly normal no exudates. Neck: Supple. No thyromegaly. No LAD. Respiratory: distant BS few rhonchi noted. Cardiovascular: Normal S1 and S2 without murmur or rub. Extremities: No cyanosis. pulses are equal. Neurologic: Alert  and oriented. No involuntary movements.  LABS: Recent Results (from the past 2160 hour(s))  Blood gas, venous     Status: Abnormal   Collection Time: 07/03/23  2:58 PM  Result Value Ref Range   FIO2  3l PORTABLE PULSE O2 %   Delivery systems NASAL CANNULA    pH, Ven 7.42 7.25 - 7.43   pCO2, Ven 64 (H) 44 - 60 mmHg   Bicarbonate 41.5 (H) 20.0 - 28.0 mmol/L   Acid-Base Excess 14.0 (H) 0.0 - 2.0 mmol/L   O2 Saturation 37.6 %   Patient temperature 37.0    Collection site VENOUS     Comment: Performed at [facility], [address] [place], [place] [address]    Radiology: No results found.  No results found.  No results found.  Assessment and Plan: Patient Active Problem List   Diagnosis Date Noted   Upper abdominal pain 01/02/2023   Generalized abdominal pain 01/02/2023   COPD with hypoxia (HCC) 01/21/2022   Erythrocytosis 01/21/2022   Tobacco abuse 01/21/2022   History of COVID-19 12/11/2020   Encounter for general adult medical examination with abnormal findings 09/17/2020   Suprapubic abdominal pain 09/17/2020   Encounter for screening mammogram for malignant neoplasm of breast  09/17/2020   Chronic obstructive pulmonary disease (HCC) 05/12/2020   Age-related osteoporosis without current pathological fracture 04/05/2020   Need for vaccination against Streptococcus pneumoniae using pneumococcal conjugate vaccine 13 03/29/2019   Cigarette smoker 11/26/2018   Obstructive chronic bronchitis without exacerbation 08/25/2018   Gastroesophageal reflux disease without esophagitis 08/25/2018   Black stool 08/25/2018   Needs flu shot 08/25/2018   Acute respiratory failure with hypoxemia (HCC) 05/21/2018   Screening for osteoporosis 05/13/2018   Acute upper respiratory infection 05/13/2018   Other fatigue 05/13/2018   Generalized anxiety disorder 05/13/2018   Vitamin D deficiency 05/13/2018   Screening for malignant neoplasm of cervix 05/13/2018   Dysuria 05/13/2018   Chest pain 01/18/2015   Shortness of breath 01/18/2015   Hyperlipemia 03/08/2014   Pulmonary HTN (HCC) 03/08/2014   Reactive airway disease 03/08/2014    1. COPD with hypoxia (HCC) She continues to have decline in her COPD status.  The patient unfortunately also continues to smoke I have had numerous conversations with her about smoking cessation.  Unfortunately this has not been really working out too well for her.  Her husband states that she is still smoking despite knowing that it is killing her  2. Chronic respiratory failure with hypoxia (HCC) On oxygen therapy this will be continued.  Will continue with the current flow rate  3. Cigarette smoker She needs to absolutely stop smoking.  I have had these conversations numerous times with her unfortunately she continues to be nonadherent  General Counseling: I have discussed the findings of the evaluation and examination with Gavin Pound.  I have also discussed any further diagnostic evaluation thatmay be needed or ordered today. Shann verbalizes understanding of the findings of todays visit. We also reviewed her medications today and discussed drug  interactions and side effects including but not limited excessive drowsiness and altered mental states. We also discussed that there is always a risk not just to her but also people around her. she has been encouraged to call the office with any questions or concerns that should arise related to todays visit.  No orders of the defined types were placed in this encounter.    Time spent: 57  I have personally obtained a history, examined the  patient, evaluated laboratory and imaging results, formulated the assessment and plan and placed orders.    Yevonne Pax, MD [facility] Pulmonary and Critical Care Sleep medicine

## 2023-07-30 ENCOUNTER — Ambulatory Visit: Payer: Medicare Other | Admitting: Nurse Practitioner

## 2023-08-10 ENCOUNTER — Other Ambulatory Visit: Payer: Self-pay | Admitting: Nurse Practitioner

## 2023-08-10 DIAGNOSIS — E782 Mixed hyperlipidemia: Secondary | ICD-10-CM

## 2023-08-11 ENCOUNTER — Other Ambulatory Visit: Payer: Self-pay | Admitting: Nurse Practitioner

## 2023-08-12 ENCOUNTER — Telehealth: Payer: Self-pay | Admitting: Nurse Practitioner

## 2023-08-12 NOTE — Telephone Encounter (Signed)
RX & nebulizer kit orders faxed to [facility]; [phone]. Scanned-Toni

## 2023-08-13 ENCOUNTER — Encounter: Payer: Self-pay | Admitting: Nurse Practitioner

## 2023-08-13 ENCOUNTER — Ambulatory Visit: Payer: Medicare Other | Admitting: Nurse Practitioner

## 2023-08-13 VITALS — BP 126/72 | HR 96 | Temp 98.0°F | Resp 16 | Ht 63.0 in | Wt 128.2 lb

## 2023-08-13 DIAGNOSIS — F17210 Nicotine dependence, cigarettes, uncomplicated: Secondary | ICD-10-CM

## 2023-08-13 DIAGNOSIS — J9611 Chronic respiratory failure with hypoxia: Secondary | ICD-10-CM

## 2023-08-13 DIAGNOSIS — F418 Other specified anxiety disorders: Secondary | ICD-10-CM

## 2023-08-13 DIAGNOSIS — J449 Chronic obstructive pulmonary disease, unspecified: Secondary | ICD-10-CM

## 2023-08-13 DIAGNOSIS — M159 Polyosteoarthritis, unspecified: Secondary | ICD-10-CM

## 2023-08-13 MED ORDER — VARENICLINE TARTRATE (STARTER) 0.5 MG X 11 & 1 MG X 42 PO TBPK
ORAL_TABLET | ORAL | 0 refills | Status: DC
Start: 2023-08-13 — End: 2023-12-22

## 2023-08-13 NOTE — Progress Notes (Signed)
[facility] [address] [place], [place] [address]  Internal MEDICINE  Office Visit Note  Patient Name: Rachael Blankenship  [id number]  [id number]  Date of Service: 08/13/2023  Chief Complaint  Patient presents with   Gastroesophageal Reflux   Hyperlipidemia    HPI Rachael Blankenship presents for a follow-up visit for COPD, smoking, GERD, OA, depression and anxiety COPD -- still on 4 LPM supplemental oxygen, her CO2 on her VBG was slightly elevated.Marland Kitchen also has not been consistent with using yupelri once daily. Discussed adherence with this and other medications  Smoking cessation -- wants to quit smoking, wants to try chantix again.  Mikael Spray -- discussed using reminders and other ideas to help her to remember to use her yupelri every day. Will see how she feels once she is using it more consistently.  GERD -- better with increased dose of famotidine Wants to get out and do things but has a hard time due to her breathing and anxiety.     Current Medication: Outpatient Encounter Medications as of 08/13/2023  Medication Sig   ALPRAZolam (XANAX) 0.25 MG tablet Take 1 tablet (0.25 mg total) by mouth 3 (three) times daily as needed for anxiety.   ascorbic acid (VITAMIN C) 500 MG tablet Take 500 mg by mouth daily.   aspirin 81 MG tablet Take 81 mg by mouth at bedtime.    Cholecalciferol 25 MCG (1000 UT) tablet Take 1,000 Units by mouth daily.    Coenzyme Q10 (CO Q-10) 100 MG CAPS Take 200 mg by mouth daily.    DENTA 5000 PLUS 1.1 % CREA dental cream Take 1 application by mouth daily.   DULERA 200-5 MCG/ACT AERO INHALE 1 PUFF INTO THE LUNGS TWICE DAILY   DULoxetine (CYMBALTA) 60 MG capsule Take 1 capsule (60 mg total) by mouth daily.   famotidine (PEPCID) 40 MG tablet Take 1 tablet (40 mg total) by mouth 2 (two) times daily.   furosemide (LASIX) 40 MG tablet TAKE 1 TABLET BY MOUTH DAILY   Ipratropium-Albuterol (COMBIVENT RESPIMAT) 20-100 MCG/ACT AERS respimat INHALE 1 PUFF INTO THE LUNGS  EVERY 6 HOURS AS NEEDED FOR WHEEZING OR SHORTNESS OF BREATH   ipratropium-albuterol (DUONEB) 0.5-2.5 (3) MG/3ML SOLN Use 1 vial via nebulizer every 4 t0 6 hrs   Magnesium 400 MG TABS Take 400 mg by mouth daily.    mometasone (NASONEX) 50 MCG/ACT nasal spray Place 2 sprays into the nose daily.   montelukast (SINGULAIR) 10 MG tablet Take 1 tablet (10 mg total) by mouth daily.   omeprazole (PRILOSEC) 40 MG capsule Take 1 capsule (40 mg total) by mouth 2 (two) times daily.   OXYGEN Inhale into the lungs. Continuous oxygen and NIV at night, 2 liters   pt uses AHP for oxygen supplies   potassium chloride (KLOR-CON) 8 MEQ tablet Take 1 capsule (8 mEq total) by mouth daily.   Potassium Chloride CR (MICRO-K) 8 MEQ CPCR capsule CR Take 1 capsule (8 mEq total) by mouth daily.   revefenacin (YUPELRI) 175 MCG/3ML nebulizer solution USE 1 VIAL IN NEBULIZER DAILY   Roflumilast 250 MCG TABS TAKE 1 TABLET BY MOUTH EVERY DAY   simvastatin (ZOCOR) 20 MG tablet TAKE 1 TABLET(20 MG) BY MOUTH EVERY EVENING FOR CHOLESTEROL   SPIRIVA HANDIHALER 18 MCG inhalation capsule Place 1 capsule (18 mcg total) into inhaler and inhale daily. i   sucralfate (CARAFATE) 1 g tablet TAKE 1 TABLET BY MOUTH FOUR TIMES DAILY AT BEDTIME WITH MEALS   Varenicline Tartrate,  Starter, 0.5 MG X 11 & 1 MG X 42 TBPK Take 0.5 mg by mouth once daily on days 1-3, then take 0.5 mg twice daily on days 4-7, then increase to 1 mg twice daily   vitamin E 180 MG (400 UNITS) capsule Take by mouth.   [DISCONTINUED] celecoxib (CELEBREX) 100 MG capsule Take 1 capsule (100 mg total) by mouth 2 (two) times daily.   No facility-administered encounter medications on file as of 08/13/2023.    Surgical History: Past Surgical History:  Procedure Laterality Date   COLONOSCOPY WITH PROPOFOL N/A 01/02/2023   Procedure: COLONOSCOPY WITH PROPOFOL;  Surgeon: Toney Reil, MD;  Location: [facility] ENDOSCOPY;  Service: Gastroenterology;  Laterality: N/A;    ESOPHAGOGASTRODUODENOSCOPY N/A 07/29/2018   Procedure: ESOPHAGOGASTRODUODENOSCOPY (EGD);  Surgeon: Toledo, Boykin Nearing, MD;  Location: [facility] ENDOSCOPY;  Service: Gastroenterology;  Laterality: N/A;   ESOPHAGOGASTRODUODENOSCOPY (EGD) WITH PROPOFOL N/A 01/02/2023   Procedure: ESOPHAGOGASTRODUODENOSCOPY (EGD) WITH PROPOFOL;  Surgeon: Toney Reil, MD;  Location: [facility] ENDOSCOPY;  Service: Gastroenterology;  Laterality: N/A;   KIDNEY STONE SURGERY     TONSILLECTOMY      Medical History: Past Medical History:  Diagnosis Date   COPD (chronic obstructive pulmonary disease) (HCC)    Endometriosis    GERD (gastroesophageal reflux disease)    Hyperlipidemia    Kidney stones    Reactive airway disease 03/08/2014    Family History: Family History  Problem Relation Age of Onset   Hypertension Mother    Hyperlipidemia Mother    GER disease Mother    Heart attack Father        23 yrs ago   Hypertension Father    Parkinson's disease Father    GER disease Father     Social History   Socioeconomic History   Marital status: Married    Spouse name: Not on file   Number of children: Not on file   Years of education: Not on file   Highest education level: Not on file  Occupational History   Not on file  Tobacco Use   Smoking status: Every Day    Current packs/day: 0.00    Average packs/day: 1 pack/day for 4.3 years (4.3 ttl pk-yrs)    Types: Cigarettes    Start date: 11/09/2017    Last attempt to quit: 02/2022    Years since quitting: 1.4   Smokeless tobacco: Never   Tobacco comments:    6 cigarettes daily.  Vaping Use   Vaping status: Former  Substance and Sexual Activity   Alcohol use: Yes    Comment: ocassionally    Drug use: No   Sexual activity: Not on file  Other Topics Concern   Not on file  Social History Narrative   Not on file   Social Determinants of Health   Financial Resource Strain: Not on file  Food Insecurity: Not on file  Transportation Needs: Not on  file  Physical Activity: Not on file  Stress: Not on file  Social Connections: Not on file  Intimate Partner Violence: Not on file      Review of Systems  Constitutional:  Positive for fatigue. Negative for chills and unexpected weight change.  HENT:  Negative for congestion, postnasal drip, rhinorrhea, sneezing and sore throat.   Eyes:  Negative for redness.  Respiratory:  Positive for cough, shortness of breath and wheezing. Negative for chest tightness.   Cardiovascular: Negative.  Negative for chest pain and palpitations.  Gastrointestinal:  Positive for abdominal distention  and abdominal pain. Negative for constipation, diarrhea, nausea and vomiting.  Genitourinary:  Negative for dysuria and frequency.  Musculoskeletal:  Negative for arthralgias, back pain, joint swelling and neck pain.  Skin:  Negative for rash.  Neurological: Negative.  Negative for tremors and numbness.  Hematological:  Negative for adenopathy. Does not bruise/bleed easily.  Psychiatric/Behavioral:  Positive for behavioral problems (Depression) and sleep disturbance. Negative for suicidal ideas. The patient is nervous/anxious.     Vital Signs: BP 126/72   Pulse (!) 103   Temp 98 F (36.7 C)   Resp 16   Ht 5\' 3"  (1.6 m)   Wt 128 lb 3.2 oz (58.2 kg)   SpO2 94% Comment: 3L  BMI 22.71 kg/m    Physical Exam Vitals reviewed.  Constitutional:      General: She is not in acute distress.    Appearance: Normal appearance. She is normal weight. She is not ill-appearing.  HENT:     Head: Normocephalic and atraumatic.  Eyes:     Pupils: Pupils are equal, round, and reactive to light.  Cardiovascular:     Rate and Rhythm: Normal rate and regular rhythm.     Heart sounds: Normal heart sounds, S1 normal and S2 normal. No murmur heard. Pulmonary:     Effort: Pulmonary effort is normal. No accessory muscle usage or respiratory distress.     Breath sounds: Normal air entry. Wheezing (intermittent) present.   Musculoskeletal:     Right lower leg: No edema.     Left lower leg: No edema.  Neurological:     Mental Status: She is alert and oriented to person, place, and time.  Psychiatric:        Mood and Affect: Mood is anxious. Affect is tearful.        Behavior: Behavior normal. Behavior is cooperative.        Assessment/Plan: 1. COPD with hypoxia (HCC) Encouraged to use yupelri once every day and tie to a part of her routine that makes it easy to remember to use it. Will reassess for more consistent compliance with the medication at her next visit and reassess respiratory with her on the yupelri more consistently  2. Chronic respiratory failure with hypoxia (HCC) Continue supplemental oxygen use   3. Primary osteoarthritis involving multiple joints Continue current medicaion as prescribed   4. Cigarette smoker Start chantix as smoking cessation aid   5. Depression with anxiety Continue medication as prescribed, encouraged to go out with her husband on short trips. Will reassess next visit   General Counseling: Rachael Blankenship understanding of the findings of todays visit and agrees with plan of treatment. I have discussed any further diagnostic evaluation that may be needed or ordered today. We also reviewed her medications today. she has been encouraged to call the office with any questions or concerns that should arise related to todays visit.    No orders of the defined types were placed in this encounter.   Meds ordered this encounter  Medications   Varenicline Tartrate, Starter, 0.5 MG X 11 & 1 MG X 42 TBPK    Sig: Take 0.5 mg by mouth once daily on days 1-3, then take 0.5 mg twice daily on days 4-7, then increase to 1 mg twice daily    Dispense:  53 each    Refill:  0    Return in about 1 month (around 09/12/2023) for F/U, eval new med, Durga Saldarriaga PCP yupelri, chantix and smoking cessation .   Total  time spent:30 Minutes Time spent includes review of chart,  medications, test results, and follow up plan with the patient.   [place] Controlled Substance Database was reviewed by me.  This patient was seen by Sallyanne Kuster, FNP-C in collaboration with Dr. Beverely Risen as a part of collaborative care agreement.   Kaiyan Luczak R. Tedd Sias, MSN, FNP-C Internal medicine

## 2023-08-17 ENCOUNTER — Other Ambulatory Visit: Payer: Self-pay | Admitting: Nurse Practitioner

## 2023-08-17 DIAGNOSIS — M159 Polyosteoarthritis, unspecified: Secondary | ICD-10-CM

## 2023-08-19 ENCOUNTER — Other Ambulatory Visit: Payer: Self-pay | Admitting: Nurse Practitioner

## 2023-08-19 DIAGNOSIS — M159 Polyosteoarthritis, unspecified: Secondary | ICD-10-CM

## 2023-09-06 ENCOUNTER — Other Ambulatory Visit: Payer: Self-pay | Admitting: Nurse Practitioner

## 2023-09-06 DIAGNOSIS — F418 Other specified anxiety disorders: Secondary | ICD-10-CM

## 2023-09-08 ENCOUNTER — Other Ambulatory Visit: Payer: Self-pay | Admitting: Nurse Practitioner

## 2023-09-08 DIAGNOSIS — K219 Gastro-esophageal reflux disease without esophagitis: Secondary | ICD-10-CM

## 2023-09-11 ENCOUNTER — Encounter: Payer: Self-pay | Admitting: Nurse Practitioner

## 2023-09-11 ENCOUNTER — Ambulatory Visit (INDEPENDENT_AMBULATORY_CARE_PROVIDER_SITE_OTHER): Payer: Medicare Other | Admitting: Nurse Practitioner

## 2023-09-11 VITALS — BP 130/68 | HR 82 | Temp 98.2°F | Resp 16 | Ht 63.0 in | Wt 132.0 lb

## 2023-09-11 DIAGNOSIS — J9611 Chronic respiratory failure with hypoxia: Secondary | ICD-10-CM

## 2023-09-11 DIAGNOSIS — Z23 Encounter for immunization: Secondary | ICD-10-CM | POA: Diagnosis not present

## 2023-09-11 DIAGNOSIS — F418 Other specified anxiety disorders: Secondary | ICD-10-CM

## 2023-09-11 DIAGNOSIS — J449 Chronic obstructive pulmonary disease, unspecified: Secondary | ICD-10-CM | POA: Diagnosis not present

## 2023-09-11 MED ORDER — ALPRAZOLAM 0.25 MG PO TABS
0.2500 mg | ORAL_TABLET | Freq: Three times a day (TID) | ORAL | 2 refills | Status: DC | PRN
Start: 2023-09-11 — End: 2023-12-11

## 2023-09-11 NOTE — Progress Notes (Signed)
[facility] [address] [place], [place] [address]  Internal MEDICINE  Office Visit Note  Patient Name: Rachael Blankenship  [id number]  [id number]  Date of Service: 09/11/2023  Chief Complaint  Patient presents with   Follow-up    Eval new med    HPI Rachael Blankenship presents for a follow-up visit for COPD, anxiety, refills.  Quit smoking 08/01/2023 Taking yupelri every day at noon.  Going out more and doing more things.  Feels like their mattress is hurting her joints Mother is in nursing home [facility] [place].  Requesting flu vaccine today Taking alprazolam for anxiety    Current Medication: Outpatient Encounter Medications as of 09/11/2023  Medication Sig   ascorbic acid (VITAMIN C) 500 MG tablet Take 500 mg by mouth daily.   aspirin 81 MG tablet Take 81 mg by mouth at bedtime.    Cholecalciferol 25 MCG (1000 UT) tablet Take 1,000 Units by mouth daily.    Coenzyme Q10 (CO Q-10) 100 MG CAPS Take 200 mg by mouth daily.    DENTA 5000 PLUS 1.1 % CREA dental cream Take 1 application by mouth daily.   DULERA 200-5 MCG/ACT AERO INHALE 1 PUFF INTO THE LUNGS TWICE DAILY   DULoxetine (CYMBALTA) 60 MG capsule Take 1 capsule (60 mg total) by mouth daily.   famotidine (PEPCID) 40 MG tablet Take 1 tablet (40 mg total) by mouth 2 (two) times daily.   furosemide (LASIX) 40 MG tablet TAKE 1 TABLET BY MOUTH DAILY   Ipratropium-Albuterol (COMBIVENT RESPIMAT) 20-100 MCG/ACT AERS respimat INHALE 1 PUFF INTO THE LUNGS EVERY 6 HOURS AS NEEDED FOR WHEEZING OR SHORTNESS OF BREATH   ipratropium-albuterol (DUONEB) 0.5-2.5 (3) MG/3ML SOLN Use 1 vial via nebulizer every 4 t0 6 hrs   Magnesium 400 MG TABS Take 400 mg by mouth daily.    mometasone (NASONEX) 50 MCG/ACT nasal spray Place 2 sprays into the nose daily.   montelukast (SINGULAIR) 10 MG tablet Take 1 tablet (10 mg total) by mouth daily.   omeprazole (PRILOSEC) 40 MG capsule Take 1 capsule (40 mg total) by mouth 2 (two) times daily.    OXYGEN Inhale into the lungs. Continuous oxygen and NIV at night, 2 liters   pt uses AHP for oxygen supplies   potassium chloride (KLOR-CON) 8 MEQ tablet Take 1 capsule (8 mEq total) by mouth daily.   Potassium Chloride CR (MICRO-K) 8 MEQ CPCR capsule CR Take 1 capsule (8 mEq total) by mouth daily.   revefenacin (YUPELRI) 175 MCG/3ML nebulizer solution USE 1 VIAL IN NEBULIZER DAILY   Roflumilast 250 MCG TABS TAKE 1 TABLET BY MOUTH EVERY DAY   simvastatin (ZOCOR) 20 MG tablet TAKE 1 TABLET(20 MG) BY MOUTH EVERY EVENING FOR CHOLESTEROL   SPIRIVA HANDIHALER 18 MCG inhalation capsule Place 1 capsule (18 mcg total) into inhaler and inhale daily. i   sucralfate (CARAFATE) 1 g tablet TAKE 1 TABLET BY MOUTH FOUR TIMES DAILY AT BEDTIME WITH MEALS   Varenicline Tartrate, Starter, 0.5 MG X 11 & 1 MG X 42 TBPK Take 0.5 mg by mouth once daily on days 1-3, then take 0.5 mg twice daily on days 4-7, then increase to 1 mg twice daily   vitamin E 180 MG (400 UNITS) capsule Take by mouth.   [DISCONTINUED] ALPRAZolam (XANAX) 0.25 MG tablet Take 1 tablet (0.25 mg total) by mouth 3 (three) times daily as needed for anxiety.   ALPRAZolam (XANAX) 0.25 MG tablet Take 1 tablet (0.25 mg total) by  mouth 3 (three) times daily as needed for anxiety.   No facility-administered encounter medications on file as of 09/11/2023.    Surgical History: Past Surgical History:  Procedure Laterality Date   COLONOSCOPY WITH PROPOFOL N/A 01/02/2023   Procedure: COLONOSCOPY WITH PROPOFOL;  Surgeon: Toney Reil, MD;  Location: [facility] ENDOSCOPY;  Service: Gastroenterology;  Laterality: N/A;   ESOPHAGOGASTRODUODENOSCOPY N/A 07/29/2018   Procedure: ESOPHAGOGASTRODUODENOSCOPY (EGD);  Surgeon: Toledo, Boykin Nearing, MD;  Location: [facility] ENDOSCOPY;  Service: Gastroenterology;  Laterality: N/A;   ESOPHAGOGASTRODUODENOSCOPY (EGD) WITH PROPOFOL N/A 01/02/2023   Procedure: ESOPHAGOGASTRODUODENOSCOPY (EGD) WITH PROPOFOL;  Surgeon: Toney Reil,  MD;  Location: [facility] ENDOSCOPY;  Service: Gastroenterology;  Laterality: N/A;   KIDNEY STONE SURGERY     TONSILLECTOMY      Medical History: Past Medical History:  Diagnosis Date   COPD (chronic obstructive pulmonary disease) (HCC)    Endometriosis    GERD (gastroesophageal reflux disease)    Hyperlipidemia    Kidney stones    Reactive airway disease 03/08/2014    Family History: Family History  Problem Relation Age of Onset   Hypertension Mother    Hyperlipidemia Mother    GER disease Mother    Heart attack Father        23 yrs ago   Hypertension Father    Parkinson's disease Father    GER disease Father     Social History   Socioeconomic History   Marital status: Married    Spouse name: Not on file   Number of children: Not on file   Years of education: Not on file   Highest education level: Not on file  Occupational History   Not on file  Tobacco Use   Smoking status: Former    Current packs/day: 0.00    Average packs/day: 1 pack/day for 4.3 years (4.3 ttl pk-yrs)    Types: Cigarettes    Start date: 11/09/2017    Quit date: 02/2022    Years since quitting: 1.5   Smokeless tobacco: Never   Tobacco comments:    6 cigarettes daily.  Vaping Use   Vaping status: Former  Substance and Sexual Activity   Alcohol use: Yes    Comment: ocassionally    Drug use: No   Sexual activity: Not on file  Other Topics Concern   Not on file  Social History Narrative   Not on file   Social Determinants of Health   Financial Resource Strain: Not on file  Food Insecurity: Not on file  Transportation Needs: Not on file  Physical Activity: Not on file  Stress: Not on file  Social Connections: Not on file  Intimate Partner Violence: Not on file      Review of Systems  Constitutional:  Positive for fatigue. Negative for chills and unexpected weight change.  HENT:  Negative for congestion, postnasal drip, rhinorrhea, sneezing and sore throat.   Eyes:  Negative for  redness.  Respiratory:  Positive for cough, shortness of breath and wheezing. Negative for chest tightness.   Cardiovascular: Negative.  Negative for chest pain and palpitations.  Gastrointestinal:  Positive for abdominal distention and abdominal pain. Negative for constipation, diarrhea, nausea and vomiting.  Genitourinary:  Negative for dysuria and frequency.  Musculoskeletal:  Negative for arthralgias, back pain, joint swelling and neck pain.  Skin:  Negative for rash.  Neurological: Negative.  Negative for tremors and numbness.  Hematological:  Negative for adenopathy. Does not bruise/bleed easily.  Psychiatric/Behavioral:  Positive for behavioral problems (  Depression) and sleep disturbance. Negative for suicidal ideas. The patient is nervous/anxious.     Vital Signs: BP 130/68   Pulse 82   Temp 98.2 F (36.8 C)   Resp 16   Ht 5\' 3"  (1.6 m)   Wt 132 lb (59.9 kg)   SpO2 99% Comment: 3L  BMI 23.38 kg/m    Physical Exam Vitals reviewed.  Constitutional:      General: She is not in acute distress.    Appearance: Normal appearance. She is normal weight. She is not ill-appearing.  HENT:     Head: Normocephalic and atraumatic.  Eyes:     Pupils: Pupils are equal, round, and reactive to light.  Cardiovascular:     Rate and Rhythm: Normal rate and regular rhythm.     Heart sounds: Normal heart sounds, S1 normal and S2 normal. No murmur heard. Pulmonary:     Effort: Pulmonary effort is normal. No accessory muscle usage or respiratory distress.     Breath sounds: Normal air entry. Wheezing (intermittent) present.  Musculoskeletal:     Right lower leg: No edema.     Left lower leg: No edema.  Neurological:     Mental Status: She is alert and oriented to person, place, and time.  Psychiatric:        Mood and Affect: Mood is anxious. Affect is tearful.        Behavior: Behavior normal. Behavior is cooperative.        Assessment/Plan: 1. COPD with hypoxia (HCC) Continue  yupelri neb treatment daily as prescribed.   2. Chronic respiratory failure with hypoxia (HCC) Continue supplemental oxygen use as instructed.   3. Need for vaccination Flu vaccine administered in office today - Influenza, MDCK, trivalent, PF(Flucelvax egg-free)  4. Depression with anxiety Continue alprazolam as prescribed. Follow up in 3 months for additional refills. - ALPRAZolam (XANAX) 0.25 MG tablet; Take 1 tablet (0.25 mg total) by mouth 3 (three) times daily as needed for anxiety.  Dispense: 90 tablet; Refill: 2   General Counseling: Jameesha verbalizes understanding of the findings of todays visit and agrees with plan of treatment. I have discussed any further diagnostic evaluation that may be needed or ordered today. We also reviewed her medications today. she has been encouraged to call the office with any questions or concerns that should arise related to todays visit.    Orders Placed This Encounter  Procedures   Influenza, MDCK, trivalent, PF(Flucelvax egg-free)    Meds ordered this encounter  Medications   ALPRAZolam (XANAX) 0.25 MG tablet    Sig: Take 1 tablet (0.25 mg total) by mouth 3 (three) times daily as needed for anxiety.    Dispense:  90 tablet    Refill:  2    Please discontinue previous orders of alprazolam, # of tablets changed to 90    Return in about 3 months (around 12/05/2023) for F/U, Jaimin Krupka PCP.   Total time spent:30 Minutes Time spent includes review of chart, medications, test results, and follow up plan with the patient.   [place] Controlled Substance Database was reviewed by me.  This patient was seen by Sallyanne Kuster, FNP-C in collaboration with Dr. Beverely Risen as a part of collaborative care agreement.   Coulton Schlink R. Tedd Sias, MSN, FNP-C Internal medicine

## 2023-10-05 ENCOUNTER — Other Ambulatory Visit: Payer: Self-pay | Admitting: Nurse Practitioner

## 2023-10-05 DIAGNOSIS — F418 Other specified anxiety disorders: Secondary | ICD-10-CM

## 2023-10-07 ENCOUNTER — Ambulatory Visit: Payer: Medicare Other | Admitting: Nurse Practitioner

## 2023-10-08 ENCOUNTER — Encounter: Payer: Self-pay | Admitting: Nurse Practitioner

## 2023-10-08 DIAGNOSIS — J9611 Chronic respiratory failure with hypoxia: Secondary | ICD-10-CM | POA: Insufficient documentation

## 2023-10-08 DIAGNOSIS — M150 Primary generalized (osteo)arthritis: Secondary | ICD-10-CM | POA: Insufficient documentation

## 2023-10-20 ENCOUNTER — Other Ambulatory Visit: Payer: Self-pay | Admitting: Nurse Practitioner

## 2023-10-20 DIAGNOSIS — E876 Hypokalemia: Secondary | ICD-10-CM

## 2023-10-21 DIAGNOSIS — H538 Other visual disturbances: Secondary | ICD-10-CM | POA: Diagnosis not present

## 2023-10-21 DIAGNOSIS — H2513 Age-related nuclear cataract, bilateral: Secondary | ICD-10-CM | POA: Diagnosis not present

## 2023-10-24 ENCOUNTER — Other Ambulatory Visit: Payer: Self-pay | Admitting: Nurse Practitioner

## 2023-10-24 DIAGNOSIS — E876 Hypokalemia: Secondary | ICD-10-CM

## 2023-10-28 ENCOUNTER — Encounter: Payer: Self-pay | Admitting: Nurse Practitioner

## 2023-10-28 ENCOUNTER — Ambulatory Visit (INDEPENDENT_AMBULATORY_CARE_PROVIDER_SITE_OTHER): Payer: Medicare Other | Admitting: Nurse Practitioner

## 2023-10-28 VITALS — BP 110/80 | HR 101 | Temp 98.3°F | Resp 16 | Ht 63.0 in | Wt 131.0 lb

## 2023-10-28 DIAGNOSIS — J9611 Chronic respiratory failure with hypoxia: Secondary | ICD-10-CM

## 2023-10-28 DIAGNOSIS — E2839 Other primary ovarian failure: Secondary | ICD-10-CM

## 2023-10-28 DIAGNOSIS — Z1231 Encounter for screening mammogram for malignant neoplasm of breast: Secondary | ICD-10-CM | POA: Diagnosis not present

## 2023-10-28 DIAGNOSIS — E876 Hypokalemia: Secondary | ICD-10-CM | POA: Diagnosis not present

## 2023-10-28 DIAGNOSIS — Z0000 Encounter for general adult medical examination without abnormal findings: Secondary | ICD-10-CM | POA: Diagnosis not present

## 2023-10-28 DIAGNOSIS — J449 Chronic obstructive pulmonary disease, unspecified: Secondary | ICD-10-CM | POA: Diagnosis not present

## 2023-10-28 NOTE — Progress Notes (Signed)
[facility] [address] [place], [place] [address]  Internal MEDICINE  Office Visit Note  Patient Name: Rachael Blankenship  [id number]  [id number]  Date of Service: 10/28/2023  Chief Complaint  Patient presents with   Diabetes   Gastroesophageal Reflux   Hyperlipidemia   Hypertension   Medicare Wellness   Quality Metric Gaps    Mammogram    HPI Raeleen presents for a medicare annual wellness visit.  Well-appearing 67 y.o. female with pulmonary hypertension, COPD with hypoxia, GERD, osteoporosis, GAD, depression, and high cholesterol.  Routine CRC screening: repeat colonoscopy was supposed to be done at 6 months but has not been done yet, needs to be done asap, patient aware Routine mammogram: due now  DEXA scan: due now  Labs: due for routine labs  New or worsening pain: none  Other concerns: none  Cataract surgery this month on both eyes.      10/28/2023    3:42 PM 10/01/2022    2:01 PM 08/29/2020    3:29 PM  MMSE - Mini Mental State Exam  Orientation to time 5 5 5   Orientation to Place 5 5 5   Registration 3 3 3   Attention/ Calculation 5 5 5   Recall 3 3 3   Language- name 2 objects 2 2 2   Language- repeat 1 1 1   Language- follow 3 step command 3 3 3   Language- read & follow direction 1 1 1   Write a sentence 1 1 1   Copy design 1 1 1   Total score 30 30 30     Functional Status Survey: Is the patient deaf or have difficulty hearing?: No Does the patient have difficulty seeing, even when wearing glasses/contacts?: No Does the patient have difficulty concentrating, remembering, or making decisions?: No Does the patient have difficulty walking or climbing stairs?: No Does the patient have difficulty dressing or bathing?: No Does the patient have difficulty doing errands alone such as visiting a doctor's office or shopping?: No     06/24/2022    1:21 PM 10/01/2022    2:00 PM 12/26/2022    1:49 PM 03/24/2023    2:42 PM 10/28/2023    3:42 PM  Fall Risk   Falls in the past year? 0 0 0 0 0  Was there an injury with Fall?  0 0    Fall Risk Category Calculator  0 0    Fall Risk Category (Retired)  Low     (RETIRED) Patient Fall Risk Level  Low fall risk     Patient at Risk for Falls Due to  No Fall Risks No Fall Risks    Fall risk Follow up  Falls evaluation completed Falls evaluation completed         03/24/2023    2:42 PM  Depression screen PHQ 2/9  Decreased Interest 0  Down, Depressed, Hopeless 0  PHQ - 2 Score 0       Current Medication: Outpatient Encounter Medications as of 10/28/2023  Medication Sig   ALPRAZolam (XANAX) 0.25 MG tablet Take 1 tablet (0.25 mg total) by mouth 3 (three) times daily as needed for anxiety.   ascorbic acid (VITAMIN C) 500 MG tablet Take 500 mg by mouth daily.   aspirin 81 MG tablet Take 81 mg by mouth at bedtime.    Cholecalciferol 25 MCG (1000 UT) tablet Take 1,000 Units by mouth daily.    Coenzyme Q10 (CO Q-10) 100 MG CAPS Take 200 mg by mouth daily.    DENTA 5000  PLUS 1.1 % CREA dental cream Take 1 application by mouth daily.   DULERA 200-5 MCG/ACT AERO INHALE 1 PUFF INTO THE LUNGS TWICE DAILY   DULoxetine (CYMBALTA) 60 MG capsule Take 1 capsule (60 mg total) by mouth daily.   famotidine (PEPCID) 40 MG tablet Take 1 tablet (40 mg total) by mouth 2 (two) times daily.   furosemide (LASIX) 40 MG tablet TAKE 1 TABLET BY MOUTH DAILY   Ipratropium-Albuterol (COMBIVENT RESPIMAT) 20-100 MCG/ACT AERS respimat INHALE 1 PUFF INTO THE LUNGS EVERY 6 HOURS AS NEEDED FOR WHEEZING OR SHORTNESS OF BREATH   ipratropium-albuterol (DUONEB) 0.5-2.5 (3) MG/3ML SOLN Use 1 vial via nebulizer every 4 t0 6 hrs   Magnesium 400 MG TABS Take 400 mg by mouth daily.    mometasone (NASONEX) 50 MCG/ACT nasal spray Place 2 sprays into the nose daily.   montelukast (SINGULAIR) 10 MG tablet Take 1 tablet (10 mg total) by mouth daily.   omeprazole (PRILOSEC) 40 MG capsule Take 1 capsule (40 mg total) by mouth 2 (two) times daily.    OXYGEN Inhale into the lungs. Continuous oxygen and NIV at night, 2 liters   pt uses [facility] for oxygen supplies   potassium chloride (KLOR-CON) 8 MEQ tablet Take 1 capsule (8 mEq total) by mouth daily.   Potassium Chloride CR (MICRO-K) 8 MEQ CPCR capsule CR Take 1 capsule (8 mEq total) by mouth daily.   revefenacin (YUPELRI) 175 MCG/3ML nebulizer solution USE 1 VIAL IN NEBULIZER DAILY   Roflumilast 250 MCG TABS TAKE 1 TABLET BY MOUTH EVERY DAY   simvastatin (ZOCOR) 20 MG tablet TAKE 1 TABLET(20 MG) BY MOUTH EVERY EVENING FOR CHOLESTEROL   SPIRIVA HANDIHALER 18 MCG inhalation capsule Place 1 capsule (18 mcg total) into inhaler and inhale daily. i   sucralfate (CARAFATE) 1 g tablet TAKE 1 TABLET BY MOUTH FOUR TIMES DAILY AT BEDTIME WITH MEALS   Varenicline Tartrate, Starter, 0.5 MG X 11 & 1 MG X 42 TBPK Take 0.5 mg by mouth once daily on days 1-3, then take 0.5 mg twice daily on days 4-7, then increase to 1 mg twice daily   vitamin E 180 MG (400 UNITS) capsule Take by mouth.   No facility-administered encounter medications on file as of 10/28/2023.    Surgical History: Past Surgical History:  Procedure Laterality Date   COLONOSCOPY WITH PROPOFOL N/A 01/02/2023   Procedure: COLONOSCOPY WITH PROPOFOL;  Surgeon: Toney Reil, MD;  Location[facility] ENDOSCOPY;  Service: Gastroenterology;  Laterality: N/A;   ESOPHAGOGASTRODUODENOSCOPY N/A 07/29/2018   Procedure: ESOPHAGOGASTRODUODENOSCOPY (EGD);  Surgeon: Toledo, Boykin Nearing, MD;  Location: [facility] ENDOSCOPY;  Service: Gastroenterology;  Laterality: N/A;   ESOPHAGOGASTRODUODENOSCOPY (EGD) WITH PROPOFOL N/A 01/02/2023   Procedure: ESOPHAGOGASTRODUODENOSCOPY (EGD) WITH PROPOFOL;  Surgeon: Toney Reil, MD;  Location: [facility] ENDOSCOPY;  Service: Gastroenterology;  Laterality: N/A;   KIDNEY STONE SURGERY     TONSILLECTOMY      Medical History: Past Medical History:  Diagnosis Date   COPD (chronic obstructive pulmonary disease) (HCC)    Endometriosis     GERD (gastroesophageal reflux disease)    Hyperlipidemia    Kidney stones    Reactive airway disease 03/08/2014    Family History: Family History  Problem Relation Age of Onset   Hypertension Mother    Hyperlipidemia Mother    GER disease Mother    Heart attack Father        23 yrs ago   Hypertension Father    Parkinson's disease Father  GER disease Father     Social History   Socioeconomic History   Marital status: Married    Spouse name: Not on file   Number of children: Not on file   Years of education: Not on file   Highest education level: Not on file  Occupational History   Not on file  Tobacco Use   Smoking status: Former    Current packs/day: 0.00    Average packs/day: 1 pack/day for 4.3 years (4.3 ttl pk-yrs)    Types: Cigarettes    Start date: 11/09/2017    Quit date: 02/2022    Years since quitting: 1.6   Smokeless tobacco: Never   Tobacco comments:    6 cigarettes daily.  Vaping Use   Vaping status: Former  Substance and Sexual Activity   Alcohol use: Yes    Comment: ocassionally    Drug use: No   Sexual activity: Not on file  Other Topics Concern   Not on file  Social History Narrative   Not on file   Social Determinants of Health   Financial Resource Strain: Not on file  Food Insecurity: Not on file  Transportation Needs: Not on file  Physical Activity: Not on file  Stress: Not on file  Social Connections: Not on file  Intimate Partner Violence: Not on file      Review of Systems  Constitutional:  Positive for activity change, appetite change and fatigue. Negative for diaphoresis and fever.  HENT:  Positive for congestion, postnasal drip and sore throat. Negative for rhinorrhea, sinus pressure, sinus pain and sneezing.   Eyes: Negative.  Negative for discharge.  Respiratory:  Positive for cough, chest tightness, shortness of breath and wheezing.        Respiratory symptoms are intermittent and long term.   Cardiovascular:  Negative.  Negative for chest pain and palpitations.  Gastrointestinal: Negative.  Negative for abdominal pain, constipation, diarrhea, nausea and vomiting.  Genitourinary: Negative.   Musculoskeletal:  Positive for arthralgias and back pain.  Skin: Negative.   Neurological:  Positive for weakness and headaches.  Psychiatric/Behavioral:  Positive for behavioral problems, decreased concentration and sleep disturbance. Negative for dysphoric mood, self-injury and suicidal ideas. The patient is nervous/anxious.     Vital Signs: BP 110/80   Pulse (!) 101   Temp 98.3 F (36.8 C)   Resp 16   Ht 5\' 3"  (1.6 m)   Wt 131 lb (59.4 kg)   SpO2 92%   BMI 23.21 kg/m    Physical Exam Vitals reviewed.  Constitutional:      General: She is awake. She is not in acute distress.    Appearance: Normal appearance. She is well-developed, well-groomed and normal weight. She is not ill-appearing or diaphoretic.  HENT:     Head: Normocephalic and atraumatic.     Mouth/Throat:     Lips: Pink.     Pharynx: Uvula midline.  Eyes:     General: Lids are normal. Vision grossly intact. Gaze aligned appropriately.     Extraocular Movements: Extraocular movements intact.     Conjunctiva/sclera: Conjunctivae normal.     Pupils: Pupils are equal, round, and reactive to light.     Funduscopic exam:    Right eye: Red reflex present.        Left eye: Red reflex present. Neck:     Thyroid: No thyromegaly.     Vascular: No JVD.     Trachea: Trachea and phonation normal. No tracheal deviation.  Cardiovascular:  Rate and Rhythm: Normal rate and regular rhythm.     Pulses: Normal pulses.     Heart sounds: Normal heart sounds, S1 normal and S2 normal. No murmur heard.    No friction rub. No gallop.  Pulmonary:     Effort: Pulmonary effort is normal. No accessory muscle usage or respiratory distress.     Breath sounds: Normal breath sounds and air entry. No stridor. No wheezing or rales.  Chest:     Chest  wall: No tenderness.     Comments: Declined clinical breast exam, mammogram ordered.  Abdominal:     Palpations: There is no shifting dullness, fluid wave, mass or pulsatile mass.     Tenderness: There is no rebound.  Skin:    Capillary Refill: Capillary refill takes less than 2 seconds.  Neurological:     Mental Status: She is alert and oriented to person, place, and time.     Cranial Nerves: No cranial nerve deficit.     Motor: No abnormal muscle tone.     Coordination: Coordination normal.     Gait: Gait normal.     Deep Tendon Reflexes: Reflexes are normal and symmetric.  Psychiatric:        Mood and Affect: Mood and affect normal.        Behavior: Behavior normal. Behavior is cooperative.        Assessment/Plan: 1. Encounter for subsequent annual wellness visit (AWV) in Medicare patient (Primary) Age-appropriate preventive screenings and vaccinations discussed. Routine labs for health maintenance will be ordered. PHM updated.    2. COPD with hypoxia (HCC) Continue dulera inhaler, yupelri neb treatments, roflumilast and prn combivent as prescribed. Also continue supplemental oxygen use as instructed.   3. Chronic respiratory failure with hypoxia (HCC) Continue supplemental oxygen use as instructed.   4. Diuretic-induced hypokalemia Continue potassium supplement as prescribed.   5. Ovarian failure due to menopause Dexa scan ordered  - DG Bone Density; Future  6. Encounter for screening mammogram for malignant neoplasm of breast Routine mammogram ordered  - MM 3D SCREENING MAMMOGRAM BILATERAL BREAST; Future  General Counseling: Chayah verbalizes understanding of the findings of todays visit and agrees with plan of treatment. I have discussed any further diagnostic evaluation that may be needed or ordered today. We also reviewed her medications today. she has been encouraged to call the office with any questions or concerns that should arise related to todays  visit.    Orders Placed This Encounter  Procedures   MM 3D SCREENING MAMMOGRAM BILATERAL BREAST   DG Bone Density    No orders of the defined types were placed in this encounter.   Return for previously scheduled, F/U, Pressley Tadesse PCP in january.   Total time spent:30 Minutes Time spent includes review of chart, medications, test results, and follow up plan with the patient.   [place] Controlled Substance Database was reviewed by me.  This patient was seen by Sallyanne Kuster, FNP-C in collaboration with Dr. Beverely Risen as a part of collaborative care agreement.  Marlisha Vanwyk R. Tedd Sias, MSN, FNP-C Internal medicine

## 2023-10-29 ENCOUNTER — Other Ambulatory Visit: Payer: Self-pay | Admitting: Nurse Practitioner

## 2023-10-29 DIAGNOSIS — J4489 Other specified chronic obstructive pulmonary disease: Secondary | ICD-10-CM

## 2023-10-31 ENCOUNTER — Other Ambulatory Visit: Payer: Self-pay | Admitting: Nurse Practitioner

## 2023-10-31 ENCOUNTER — Encounter: Payer: Self-pay | Admitting: Nurse Practitioner

## 2023-10-31 DIAGNOSIS — J449 Chronic obstructive pulmonary disease, unspecified: Secondary | ICD-10-CM

## 2023-11-02 ENCOUNTER — Other Ambulatory Visit: Payer: Self-pay | Admitting: Nurse Practitioner

## 2023-11-02 DIAGNOSIS — F418 Other specified anxiety disorders: Secondary | ICD-10-CM

## 2023-11-06 ENCOUNTER — Other Ambulatory Visit: Payer: Self-pay | Admitting: Nurse Practitioner

## 2023-11-06 DIAGNOSIS — E782 Mixed hyperlipidemia: Secondary | ICD-10-CM

## 2023-11-17 DIAGNOSIS — H2511 Age-related nuclear cataract, right eye: Secondary | ICD-10-CM | POA: Diagnosis not present

## 2023-11-21 ENCOUNTER — Encounter: Payer: Self-pay | Admitting: Ophthalmology

## 2023-11-21 ENCOUNTER — Other Ambulatory Visit: Payer: Self-pay | Admitting: Nurse Practitioner

## 2023-11-21 DIAGNOSIS — J9611 Chronic respiratory failure with hypoxia: Secondary | ICD-10-CM | POA: Diagnosis not present

## 2023-11-21 DIAGNOSIS — E559 Vitamin D deficiency, unspecified: Secondary | ICD-10-CM | POA: Diagnosis not present

## 2023-11-21 DIAGNOSIS — E538 Deficiency of other specified B group vitamins: Secondary | ICD-10-CM | POA: Diagnosis not present

## 2023-11-21 DIAGNOSIS — D509 Iron deficiency anemia, unspecified: Secondary | ICD-10-CM | POA: Diagnosis not present

## 2023-11-21 DIAGNOSIS — E782 Mixed hyperlipidemia: Secondary | ICD-10-CM | POA: Diagnosis not present

## 2023-11-21 DIAGNOSIS — E876 Hypokalemia: Secondary | ICD-10-CM | POA: Diagnosis not present

## 2023-11-21 NOTE — Anesthesia Preprocedure Evaluation (Addendum)
Anesthesia Evaluation    Airway        Dental   Pulmonary Patient abstained from smoking., former smoker          Cardiovascular   01-24-19 echo:  AORTIC ROOT          Size:Normal    Dissection:No dissection   AORTIC VALVE      Leaflets:Tricuspid           Morphology:Normal      Mobility:Fully mobile   LEFT VENTRICLE          Size:Normal                Anterior:Normal   Contraction:Normal                 Lateral:Normal    Closest EF:>55% (Estimated)        Septal:Normal     LV Masses:No Masses               Apical:Normal           PPI:RJJO                  Inferior:Normal                                    Posterior:Normal  Dias.FxClass:(Grade 1) relaxation abnormal, E/A reversal   MITRAL VALVE      Leaflets:Normal                Mobility:Fully mobile    Morphology:Normal   LEFT ATRIUM          Size:Normal               LA Masses:No masses     IA Septum:Normal IAS   MAIN PA          Size:Normal   PULMONIC VALVE    Morphology:Normal                Mobility:Fully mobile   RIGHT VENTRICLE     RV Masses:No Masses                 Size:Normal     Free Wall:Normal             Contraction:Normal   TRICUSPID VALVE      Leaflets:Normal                Mobility:Fully mobile    Morphology:Normal   RIGHT ATRIUM          Size:Normal                RA Other:None       RA Mass:No masses   PERICARDIUM        Fluid:No effusion   INFERIOR VENACAVA          Size:Normal Normal respiratory collapse     Neuro/Psych    GI/Hepatic   Endo/Other    Renal/GU      Musculoskeletal   Abdominal   Peds  Hematology   Anesthesia Other Findings COPD (chronic obstructive pulmonary disease)  Kidney stones Endometriosis  Hyperlipidemia GERD (gastroesophageal reflux disease) Reactive airway disease Depression with anxiety Grade I diastolic dysfunction Quit smoking 08-01-23  Chronic respiratory failure with hypoxia COPD  with hypoxia Supplemental oxygen    Reproductive/Obstetrics  Anesthesia Physical Anesthesia Plan  ASA: 3  Anesthesia Plan: MAC   Post-op Pain Management:    Induction: Intravenous  PONV Risk Score and Plan:   Airway Management Planned: Natural Airway and Nasal Cannula  Additional Equipment:   Intra-op Plan:   Post-operative Plan:   Informed Consent: I have reviewed the patients History and Physical, chart, labs and discussed the procedure including the risks, benefits and alternatives for the proposed anesthesia with the patient or authorized representative who has indicated his/her understanding and acceptance.     Dental Advisory Given  Plan Discussed with: Anesthesiologist, CRNA and Surgeon  Anesthesia Plan Comments: (Patient consented for risks of anesthesia including but not limited to:  - adverse reactions to medications - damage to eyes, teeth, lips or other oral mucosa - nerve damage due to positioning  - sore throat or hoarseness - Damage to heart, brain, nerves, lungs, other parts of body or loss of life  Patient voiced understanding and assent.)         Anesthesia Quick Evaluation

## 2023-11-22 LAB — CBC WITH DIFFERENTIAL/PLATELET
Basophils Absolute: 0 10*3/uL (ref 0.0–0.2)
Basos: 0 %
EOS (ABSOLUTE): 0.2 10*3/uL (ref 0.0–0.4)
Eos: 3 %
Hematocrit: 41.5 % (ref 34.0–46.6)
Hemoglobin: 13.3 g/dL (ref 11.1–15.9)
Immature Grans (Abs): 0 10*3/uL (ref 0.0–0.1)
Immature Granulocytes: 0 %
Lymphocytes Absolute: 2.7 10*3/uL (ref 0.7–3.1)
Lymphs: 37 %
MCH: 27.1 pg (ref 26.6–33.0)
MCHC: 32 g/dL (ref 31.5–35.7)
MCV: 85 fL (ref 79–97)
Monocytes Absolute: 0.5 10*3/uL (ref 0.1–0.9)
Monocytes: 6 %
Neutrophils Absolute: 3.9 10*3/uL (ref 1.4–7.0)
Neutrophils: 54 %
Platelets: 384 10*3/uL (ref 150–450)
RBC: 4.9 x10E6/uL (ref 3.77–5.28)
RDW: 13.7 % (ref 11.7–15.4)
WBC: 7.3 10*3/uL (ref 3.4–10.8)

## 2023-11-22 LAB — LIPID PANEL
Chol/HDL Ratio: 3.5 {ratio} (ref 0.0–4.4)
Cholesterol, Total: 256 mg/dL — ABNORMAL HIGH (ref 100–199)
HDL: 74 mg/dL (ref >39)
LDL Chol Calc (NIH): 165 mg/dL — ABNORMAL HIGH (ref 0–99)
Triglycerides: 101 mg/dL (ref 0–149)
VLDL Cholesterol Cal: 17 mg/dL (ref 5–40)

## 2023-11-22 LAB — COMPREHENSIVE METABOLIC PANEL
ALT: 18 [IU]/L (ref 0–32)
AST: 24 [IU]/L (ref 0–40)
Albumin: 4.4 g/dL (ref 3.9–4.9)
Alkaline Phosphatase: 117 [IU]/L (ref 44–121)
BUN/Creatinine Ratio: 26 (ref 12–28)
BUN: 15 mg/dL (ref 8–27)
Bilirubin Total: 0.4 mg/dL (ref 0.0–1.2)
CO2: 32 mmol/L — ABNORMAL HIGH (ref 20–29)
Calcium: 9.4 mg/dL (ref 8.7–10.3)
Chloride: 96 mmol/L (ref 96–106)
Creatinine, Ser: 0.58 mg/dL (ref 0.57–1.00)
Globulin, Total: 2.3 g/dL (ref 1.5–4.5)
Glucose: 116 mg/dL — ABNORMAL HIGH (ref 70–99)
Potassium: 4.5 mmol/L (ref 3.5–5.2)
Sodium: 141 mmol/L (ref 134–144)
Total Protein: 6.7 g/dL (ref 6.0–8.5)
eGFR: 98 mL/min/{1.73_m2} (ref >59)

## 2023-11-22 LAB — IRON AND TIBC
Iron Saturation: 22 % (ref 15–55)
Iron: 78 ug/dL (ref 27–139)
Total Iron Binding Capacity: 358 ug/dL (ref 250–450)
UIBC: 280 ug/dL (ref 118–369)

## 2023-11-22 LAB — B12 AND FOLATE PANEL
Folate: >20 ng/mL (ref >3.0)
Vitamin B-12: 759 pg/mL (ref 232–1245)

## 2023-11-22 LAB — VITAMIN D 25 HYDROXY (VIT D DEFICIENCY, FRACTURES): Vit D, 25-Hydroxy: 37.8 ng/mL (ref 30.0–100.0)

## 2023-11-22 LAB — FERRITIN: Ferritin: 51 ng/mL (ref 15–150)

## 2023-11-24 ENCOUNTER — Encounter: Payer: Self-pay | Admitting: Ophthalmology

## 2023-11-27 ENCOUNTER — Other Ambulatory Visit: Payer: Self-pay | Admitting: Nurse Practitioner

## 2023-11-27 DIAGNOSIS — J449 Chronic obstructive pulmonary disease, unspecified: Secondary | ICD-10-CM

## 2023-12-02 NOTE — Discharge Instructions (Signed)

## 2023-12-04 ENCOUNTER — Encounter
Admission: RE | Disposition: A | Discharge status: Home / Self Care | Payer: Self-pay | Source: Home / Self Care | Attending: Ophthalmology

## 2023-12-04 ENCOUNTER — Ambulatory Visit: Payer: Medicare Other | Admitting: Anesthesiology

## 2023-12-04 ENCOUNTER — Other Ambulatory Visit: Payer: Self-pay

## 2023-12-04 ENCOUNTER — Other Ambulatory Visit: Payer: Self-pay | Admitting: Nurse Practitioner

## 2023-12-04 ENCOUNTER — Encounter: Payer: Self-pay | Admitting: Ophthalmology

## 2023-12-04 ENCOUNTER — Ambulatory Visit
Admission: RE | Admit: 2023-12-04 | Discharge: 2023-12-04 | Disposition: A | Discharge status: Home / Self Care | Payer: Medicare Other | Attending: Ophthalmology | Admitting: Ophthalmology

## 2023-12-04 DIAGNOSIS — J449 Chronic obstructive pulmonary disease, unspecified: Secondary | ICD-10-CM | POA: Diagnosis not present

## 2023-12-04 DIAGNOSIS — K219 Gastro-esophageal reflux disease without esophagitis: Secondary | ICD-10-CM | POA: Insufficient documentation

## 2023-12-04 DIAGNOSIS — Z87891 Personal history of nicotine dependence: Secondary | ICD-10-CM | POA: Diagnosis not present

## 2023-12-04 DIAGNOSIS — I10 Essential (primary) hypertension: Secondary | ICD-10-CM | POA: Diagnosis not present

## 2023-12-04 DIAGNOSIS — H2511 Age-related nuclear cataract, right eye: Secondary | ICD-10-CM | POA: Insufficient documentation

## 2023-12-04 HISTORY — DX: Other ill-defined heart diseases: I51.89

## 2023-12-04 HISTORY — DX: Other specified anxiety disorders: F41.8

## 2023-12-04 HISTORY — DX: Chronic respiratory failure with hypoxia: J96.11

## 2023-12-04 HISTORY — DX: Nicotine dependence, cigarettes, uncomplicated: F17.210

## 2023-12-04 HISTORY — DX: Dependence on supplemental oxygen: Z99.81

## 2023-12-04 HISTORY — PX: CATARACT EXTRACTION W/PHACO: SHX586

## 2023-12-04 SURGERY — CATARACT EXTRACTION PHACO AND INTRAOCULAR LENS PLACEMENT (IOC)
Anesthesia: Monitor Anesthesia Care | Site: Eye | Laterality: Right

## 2023-12-04 MED ORDER — SIGHTPATH DOSE#1 NA HYALUR & NA CHOND-NA HYALUR IO KIT
PACK | INTRAOCULAR | Status: DC | PRN
  Administered 2023-12-04: 1 via OPHTHALMIC

## 2023-12-04 MED ORDER — MOXIFLOXACIN HCL 0.5 % OP SOLN
OPHTHALMIC | Status: DC | PRN
  Administered 2023-12-04: .2 mL via OPHTHALMIC

## 2023-12-04 MED ORDER — SIGHTPATH DOSE#1 BSS IO SOLN
INTRAOCULAR | Status: DC | PRN
  Administered 2023-12-04: 70 mL via OPHTHALMIC

## 2023-12-04 MED ORDER — ARMC OPHTHALMIC DILATING DROPS
1.0000 | OPHTHALMIC | Status: AC
Start: 2023-12-04 — End: 2023-12-04
  Administered 2023-12-04 (×3): 1 via OPHTHALMIC

## 2023-12-04 MED ORDER — MIDAZOLAM HCL 2 MG/2ML IJ SOLN
INTRAMUSCULAR | Status: DC | PRN
  Administered 2023-12-04 (×2): 1 mg via INTRAVENOUS

## 2023-12-04 MED ORDER — LIDOCAINE HCL (PF) 2 % IJ SOLN
INTRAOCULAR | Status: DC | PRN
  Administered 2023-12-04: 1 mL via INTRAOCULAR

## 2023-12-04 MED ORDER — TETRACAINE HCL 0.5 % OP SOLN
OPHTHALMIC | Status: AC
  Filled 2023-12-04: qty 4

## 2023-12-04 MED ORDER — FENTANYL CITRATE (PF) 100 MCG/2ML IJ SOLN
INTRAMUSCULAR | Status: DC | PRN
  Administered 2023-12-04: 50 ug via INTRAVENOUS

## 2023-12-04 MED ORDER — ARMC OPHTHALMIC DILATING DROPS
OPHTHALMIC | Status: AC
  Filled 2023-12-04: qty 0.5

## 2023-12-04 MED ORDER — BRIMONIDINE TARTRATE-TIMOLOL 0.2-0.5 % OP SOLN
OPHTHALMIC | Status: DC | PRN
  Administered 2023-12-04: 1 [drp] via OPHTHALMIC

## 2023-12-04 MED ORDER — MIDAZOLAM HCL 2 MG/2ML IJ SOLN
INTRAMUSCULAR | Status: AC
  Filled 2023-12-04: qty 2

## 2023-12-04 MED ORDER — TETRACAINE HCL 0.5 % OP SOLN
1.0000 [drp] | OPHTHALMIC | Status: AC
Start: 2023-12-04 — End: 2023-12-04
  Administered 2023-12-04 (×3): 1 [drp] via OPHTHALMIC

## 2023-12-04 MED ORDER — SIGHTPATH DOSE#1 BSS IO SOLN
INTRAOCULAR | Status: DC | PRN
  Administered 2023-12-04: 15 mL

## 2023-12-04 MED ORDER — FENTANYL CITRATE (PF) 100 MCG/2ML IJ SOLN
INTRAMUSCULAR | Status: AC
  Filled 2023-12-04: qty 2

## 2023-12-04 SURGICAL SUPPLY — 15 items
BNDG EYE OVAL 2 1/8 X 2 5/8 (GAUZE/BANDAGES/DRESSINGS)
CANNULA ANT/CHMB 27GA (MISCELLANEOUS)
CATARACT SUITE SIGHTPATH (MISCELLANEOUS) ×1
DISSECTOR HYDRO NUCLEUS 50X22 (MISCELLANEOUS) ×1
DRSG TEGADERM 2-3/8X2-3/4 SM (GAUZE/BANDAGES/DRESSINGS) ×1
GLOVE SURG SYN 7.5 E (GLOVE) ×1
GLOVE SURG SYN 8.5 E (GLOVE) ×1
LENS CLAREON VIVITY TORIC 17 ×1 IMPLANT
NDL FILTER BLUNT 18X1 1/2 (NEEDLE) IMPLANT
NEEDLE FILTER BLUNT 18X1 1/2 (NEEDLE)
PACK VIT ANT 23G (MISCELLANEOUS)
RING MALYGIN 7.0 (MISCELLANEOUS)
SUT ETHILON 10-0 CS-B-6CS-B-6 (SUTURE)
SYR 3ML LL SCALE MARK (SYRINGE)
SYR 5ML LL (SYRINGE)

## 2023-12-04 NOTE — Transfer of Care (Signed)
 Immediate Anesthesia Transfer of Care Note  Patient: Rachael Blankenship  Procedure(s) Performed: CATARACT EXTRACTION PHACO AND INTRAOCULAR LENS PLACEMENT (IOC) RIGHT  CLAREON VIVITY LENS (Right: Eye)  Patient Location: PACU  Anesthesia Type: MAC  Level of Consciousness: awake, alert  and patient cooperative  Airway and Oxygen  Therapy: Patient Spontanous Breathing and Patient connected to supplemental oxygen   Post-op Assessment: Post-op Vital signs reviewed, Patient's Cardiovascular Status Stable, Respiratory Function Stable, Patent Airway and No signs of Nausea or vomiting  Post-op Vital Signs: Reviewed and stable  Complications: No notable events documented.

## 2023-12-04 NOTE — Op Note (Signed)
 OPERATIVE NOTE  Rachael Blankenship [id number] 12/04/2023   PREOPERATIVE DIAGNOSIS: Nuclear sclerotic cataract right eye. H25.11   POSTOPERATIVE DIAGNOSIS: Nuclear sclerotic cataract right eye. H25.11   PROCEDURE:  Phacoemusification with Toric posterior chamber intraocular lens placement of the right eye  Ultrasound time: Procedure(s) with comments: CATARACT EXTRACTION PHACO AND INTRAOCULAR LENS PLACEMENT (IOC) RIGHT  CLAREON VIVITY LENS (Right) - 10.40 1:04.0  LENS:   Implant Name Type Inv. Item Serial No. Manufacturer Lot No. LRB No. Used Action  LENS CLAREON VIVITY TORIC 17 - [id number]  LENS CLAREON VIVITY TORIC 17 [id number] [employer]  Right 1 Implanted    +17.00 D [id number] Toric intraocular lens with 1.50 diopters of cylindrical power with axis orientation at 122 degrees.  SURGEON:  Feliciano HERO. [place], MD   ANESTHESIA:  Topical with tetracaine  drops, augmented with 1% preservative-free intracameral lidocaine .   COMPLICATIONS:  None.   DESCRIPTION OF PROCEDURE:  The patient was identified in the holding room and transported to the operating room and placed in the supine position under the operating microscope.  The right eye was identified as the operative eye, which was prepped and draped in the usual sterile ophthalmic fashion.   A 1 millimeter clear-corneal paracentesis was made superotemporally. Preservative-free 1% lidocaine  mixed with 1:1,000 bisulfite-free aqueous solution of epinephrine  was injected into the anterior chamber. The anterior chamber was then filled with Viscoat viscoelastic. A 2.4 millimeter keratome was used to make a clear-corneal incision inferotemporally. A curvilinear capsulorrhexis was made with a cystotome and capsulorrhexis forceps. Balanced salt  solution was used to hydrodissect and hydrodelineate the nucleus. Phacoemulsification was then used to remove the lens nucleus and epinucleus. The remaining cortex was then removed using the irrigation and aspiration  handpiece. Provisc was then placed into the capsular bag to distend it for lens placement. The Verion digital marker was used to align the implant at the intended axis.  A +17.00 D [id number] Toric lens was then injected into the capsular bag.  It was rotated clockwise until the axis marks on the lens were approximately 15 degrees in the counterclockwise direction to the intended alignment.  The viscoelastic was aspirated from the eye using the irrigation aspiration handpiece.  Then, a blunt chopper through the sideport incision was used to rotate the lens in a clockwise direction until the axis markings of the intraocular lens were lined up with the Verion alignment.  Balanced salt  solution was then used to hydrate the wounds.   The anterior chamber was inflated to a physiologic pressure with balanced salt  solution.  No wound leaks were noted. Vigamox  was injected intracamerally.  Timolol  and Brimonidine  drops were applied to the eye.  The patient was taken to the recovery room in stable condition without complications of anesthesia or surgery.  [name] [place] 12/04/2023, 11:20 AM

## 2023-12-04 NOTE — H&P (Signed)
 [facility]   Primary Care Physician:  Liana Fish, NP Ophthalmologist: Dr. Feliciano Ober  Pre-Procedure History & Physical: HPI:  Rachael Blankenship is a 68 y.o. female here for cataract surgery.   Past Medical History:  Diagnosis Date   Chronic respiratory failure with hypoxia (HCC)    Cigarette smoker    COPD (chronic obstructive pulmonary disease) (HCC)    Dependence on supplemental oxygen     Depression with anxiety    Endometriosis    GERD (gastroesophageal reflux disease)    Grade I diastolic dysfunction    Hyperlipidemia    Kidney stones    Reactive airway disease 03/08/2014    Past Surgical History:  Procedure Laterality Date   COLONOSCOPY WITH PROPOFOL  N/A 01/02/2023   Procedure: COLONOSCOPY WITH PROPOFOL ;  Surgeon: Unk Corinn Skiff, MD;  Location: [facility] ENDOSCOPY;  Service: Gastroenterology;  Laterality: N/A;   ESOPHAGOGASTRODUODENOSCOPY N/A 07/29/2018   Procedure: ESOPHAGOGASTRODUODENOSCOPY (EGD);  Surgeon: Toledo, Ladell MARLA, MD;  Location: [facility] ENDOSCOPY;  Service: Gastroenterology;  Laterality: N/A;   ESOPHAGOGASTRODUODENOSCOPY (EGD) WITH PROPOFOL  N/A 01/02/2023   Procedure: ESOPHAGOGASTRODUODENOSCOPY (EGD) WITH PROPOFOL ;  Surgeon: Unk Corinn Skiff, MD;  Location: [facility] ENDOSCOPY;  Service: Gastroenterology;  Laterality: N/A;   KIDNEY STONE SURGERY     TONSILLECTOMY      Prior to Admission medications   Medication Sig Start Date End Date Taking? Authorizing Provider  ALPRAZolam  (XANAX ) 0.25 MG tablet Take 1 tablet (0.25 mg total) by mouth 3 (three) times daily as needed for anxiety. 09/11/23  Yes Abernathy, Fish, NP  Cholecalciferol  25 MCG (1000 UT) tablet Take 1,000 Units by mouth daily.    Yes [provider]  Coenzyme Q10 (CO Q-10) 100 MG CAPS Take 200 mg by mouth daily.    Yes [provider]  DENTA 5000 PLUS 1.1 % CREA dental cream Take 1 application by mouth daily. 04/14/21  Yes [provider]  DULERA  200-5 MCG/ACT AERO  INHALE 1 PUFF INTO THE LUNGS TWICE DAILY 05/23/23  Yes Abernathy, Alyssa, NP  DULoxetine  (CYMBALTA ) 60 MG capsule Take 1 capsule (60 mg total) by mouth daily. 11/02/23  Yes Khan, Fozia M, MD  famotidine  (PEPCID ) 40 MG tablet Take 1 tablet (40 mg total) by mouth 2 (two) times daily. 07/02/23  Yes Abernathy, Fish, NP  furosemide  (LASIX ) 40 MG tablet TAKE 1 TABLET BY MOUTH DAILY 02/24/23  Yes Abernathy, Alyssa, NP  Ipratropium-Albuterol  (COMBIVENT  RESPIMAT) 20-100 MCG/ACT AERS respimat INHALE 1 PUFF INTO THE LUNGS EVERY 6 HOURS AS NEEDED FOR WHEEZING OR SHORTNESS OF BREATH 10/31/23  Yes Khan, Fozia M, MD  Magnesium  400 MG TABS Take 400 mg by mouth daily.    Yes [provider]  mometasone  (NASONEX ) 50 MCG/ACT nasal spray Place 2 sprays into the nose daily.   Yes [provider]  montelukast  (SINGULAIR ) 10 MG tablet Take 1 tablet (10 mg total) by mouth daily. 01/31/23  Yes Abernathy, Alyssa, NP  Multiple Vitamins-Minerals (MULTIVITAMIN WITH MINERALS) tablet Take 1 tablet by mouth daily.   Yes [provider]  omeprazole  (PRILOSEC) 40 MG capsule Take 1 capsule (40 mg total) by mouth 2 (two) times daily. 09/08/23  Yes Abernathy, Fish, NP  potassium chloride  (KLOR-CON ) 8 MEQ tablet Take 1 capsule (8 mEq total) by mouth daily. 10/20/23  Yes Abernathy, Fish, NP  Potassium Chloride  CR (MICRO-K ) 8 MEQ CPCR capsule CR Take 1 capsule (8 mEq total) by mouth daily. 10/26/23  Yes Khan, Fozia M, MD  revefenacin  (YUPELRI ) 175 MCG/3ML nebulizer  solution USE 1 VIAL IN NEBULIZER DAILY 08/11/23  Yes Abernathy, Alyssa, NP  SPIRIVA  HANDIHALER 18 MCG inhalation capsule Place 1 capsule (18 mcg total) into inhaler and inhale daily. i 10/29/23  Yes Abernathy, Alyssa, NP  sucralfate  (CARAFATE ) 1 g tablet TAKE 1 TABLET BY MOUTH FOUR TIMES DAILY AT BEDTIME WITH MEALS 06/19/23  Yes Abernathy, Alyssa, NP  ascorbic acid  (VITAMIN C ) 500 MG tablet Take 500 mg by mouth daily. Patient not taking: Reported on  11/24/2023    [provider]  aspirin  81 MG tablet Take 81 mg by mouth at bedtime.     [provider]  ipratropium-albuterol  (DUONEB) 0.5-2.5 (3) MG/3ML SOLN Use 1 vial via nebulizer every 4 t0 6 hrs Patient not taking: Reported on 11/24/2023 09/13/21   Liana Fish, NP  OXYGEN  Inhale 4 L into the lungs continuous. Continuous oxygen  and NIV at night, 2 liters   pt uses [facility] for oxygen  supplies    [provider]  Roflumilast  250 MCG TABS TAKE 1 TABLET BY MOUTH EVERY DAY 11/27/23   Liana Fish, NP  simvastatin  (ZOCOR ) 20 MG tablet TAKE 1 TABLET(20 MG) BY MOUTH EVERY EVENING FOR CHOLESTEROL Patient not taking: Reported on 11/24/2023 11/06/23   Liana Fish, NP  Varenicline  Tartrate, Starter, 0.5 MG X 11 & 1 MG X 42 TBPK Take 0.5 mg by mouth once daily on days 1-3, then take 0.5 mg twice daily on days 4-7, then increase to 1 mg twice daily Patient not taking: Reported on 11/24/2023 08/13/23   Liana Fish, NP  vitamin E  180 MG (400 UNITS) capsule Take by mouth. Patient not taking: Reported on 11/24/2023    [provider]    Allergies as of 10/27/2023 - Review Complete 10/08/2023  Allergen Reaction Noted   Naproxen Itching 05/16/2015   Sulfa antibiotics Hives and Rash 03/08/2014    Family History  Problem Relation Age of Onset   Hypertension Mother    Hyperlipidemia Mother    GER disease Mother    Heart attack Father        23 yrs ago   Hypertension Father    Parkinson's disease Father    GER disease Father     Social History   Socioeconomic History   Marital status: Married    Spouse name: Not on file   Number of children: Not on file   Years of education: Not on file   Highest education level: Not on file  Occupational History   Not on file  Tobacco Use   Smoking status: Former    Current packs/day: 0.00    Average packs/day: 1 pack/day for 4.3 years (4.3 ttl pk-yrs)    Types: Cigarettes    Start date: 11/09/2017     Quit date: 02/2022    Years since quitting: 1.7   Smokeless tobacco: Never   Tobacco comments:    6 cigarettes daily.  Vaping Use   Vaping status: Former  Substance and Sexual Activity   Alcohol use: Yes    Comment: ocassionally    Drug use: No   Sexual activity: Not on file  Other Topics Concern   Not on file  Social History Narrative   Not on file   Social Drivers of Health   Financial Resource Strain: Not on file  Food Insecurity: Not on file  Transportation Needs: Not on file  Physical Activity: Not on file  Stress: Not on file  Social Connections: Not on file  Intimate Partner Violence: Not on  file    Review of Systems: See HPI, otherwise negative ROS  Physical Exam: Ht 5' 3 (1.6 m)   Wt 59 kg   BMI 23.03 kg/m  General:   Alert, cooperative in NAD Head:  Normocephalic and atraumatic. Respiratory:  Normal work of breathing. Cardiovascular:  RRR  Impression/Plan: DEIJA BUHRMAN is here for cataract surgery.  Risks, benefits, limitations, and alternatives regarding cataract surgery have been reviewed with the patient.  Questions have been answered.  All parties agreeable.   Feliciano Bryan Ober, MD  12/04/2023, 7:15 AM

## 2023-12-04 NOTE — Anesthesia Postprocedure Evaluation (Signed)
 Anesthesia Post Note  Patient: Rachael Blankenship  Procedure(s) Performed: CATARACT EXTRACTION PHACO AND INTRAOCULAR LENS PLACEMENT (IOC) RIGHT  CLAREON VIVITY LENS (Right: Eye)  Patient location during evaluation: PACU Anesthesia Type: MAC Level of consciousness: awake and alert Pain management: pain level controlled Vital Signs Assessment: post-procedure vital signs reviewed and stable Respiratory status: spontaneous breathing, nonlabored ventilation, respiratory function stable and patient connected to nasal cannula oxygen  Cardiovascular status: stable and blood pressure returned to baseline Postop Assessment: no apparent nausea or vomiting Anesthetic complications: no   No notable events documented.   Last Vitals:  Vitals:   12/04/23 1122 12/04/23 1126  BP: 116/68 128/67  Pulse: 92 88  Resp: 20 (!) 21  Temp: 36.6 C 36.6 C  SpO2: 96% 97%    Last Pain:  Vitals:   12/04/23 1126  TempSrc:   PainSc: 0-No pain                 Maijor Hornig C Danile Trier

## 2023-12-05 ENCOUNTER — Encounter: Payer: Self-pay | Admitting: Ophthalmology

## 2023-12-09 ENCOUNTER — Other Ambulatory Visit: Payer: Self-pay

## 2023-12-09 DIAGNOSIS — J4489 Other specified chronic obstructive pulmonary disease: Secondary | ICD-10-CM

## 2023-12-09 MED ORDER — SPIRIVA HANDIHALER 18 MCG IN CAPS: 18.0000 ug | ORAL_CAPSULE | Freq: Every day | RESPIRATORY_TRACT | 5 refills | Status: DC

## 2023-12-10 ENCOUNTER — Encounter: Payer: Self-pay | Admitting: Ophthalmology

## 2023-12-10 DIAGNOSIS — H2512 Age-related nuclear cataract, left eye: Secondary | ICD-10-CM | POA: Diagnosis not present

## 2023-12-11 ENCOUNTER — Encounter: Payer: Self-pay | Admitting: Nurse Practitioner

## 2023-12-11 ENCOUNTER — Ambulatory Visit: Payer: Medicare Other | Admitting: Nurse Practitioner

## 2023-12-11 VITALS — BP 136/74 | HR 111 | Temp 97.9°F | Resp 16 | Ht 63.0 in | Wt 134.2 lb

## 2023-12-11 DIAGNOSIS — J9611 Chronic respiratory failure with hypoxia: Secondary | ICD-10-CM | POA: Diagnosis not present

## 2023-12-11 DIAGNOSIS — F418 Other specified anxiety disorders: Secondary | ICD-10-CM | POA: Diagnosis not present

## 2023-12-11 DIAGNOSIS — G720 Drug-induced myopathy: Secondary | ICD-10-CM | POA: Diagnosis not present

## 2023-12-11 DIAGNOSIS — R7303 Prediabetes: Secondary | ICD-10-CM | POA: Diagnosis not present

## 2023-12-11 DIAGNOSIS — J449 Chronic obstructive pulmonary disease, unspecified: Secondary | ICD-10-CM

## 2023-12-11 DIAGNOSIS — E782 Mixed hyperlipidemia: Secondary | ICD-10-CM

## 2023-12-11 DIAGNOSIS — R7301 Impaired fasting glucose: Secondary | ICD-10-CM

## 2023-12-11 LAB — POCT GLYCOSYLATED HEMOGLOBIN (HGB A1C): Hemoglobin A1C: 6.2 % — AB (ref 4.0–5.6)

## 2023-12-11 MED ORDER — GEMFIBROZIL 600 MG PO TABS: 600.0000 mg | ORAL_TABLET | Freq: Two times a day (BID) | ORAL | 3 refills | Status: DC

## 2023-12-11 MED ORDER — ALPRAZOLAM 0.25 MG PO TABS: 0.2500 mg | ORAL_TABLET | Freq: Three times a day (TID) | ORAL | 2 refills | Status: DC | PRN

## 2023-12-11 NOTE — Progress Notes (Signed)
[facility] [address] [place], [place] [address]  Internal MEDICINE  Office Visit Note  Patient Name: Rachael Blankenship  [id number]  [id number]  Date of Service: 12/11/2023  Chief Complaint  Patient presents with   Depression   Gastroesophageal Reflux   Hypertension    HPI Carmelia presents for a follow-up visit for prediabetes, COPD, high cholesterol and anxiety. Imapired fasting glucose -- was 116 on fasting labs recently, checking A1c today. A1c is 6.2 -- prediabetic COPD, oxygen dependent -- serum CO2 is elevated at 32.  Cholesterol levels are still elevated but slightly improved.  Anxiety -- taking alprazolam as needed.     Current Medication: Outpatient Encounter Medications as of 12/11/2023  Medication Sig   ascorbic acid (VITAMIN C) 500 MG tablet Take 500 mg by mouth daily.   aspirin 81 MG tablet Take 81 mg by mouth at bedtime.    Cholecalciferol 25 MCG (1000 UT) tablet Take 1,000 Units by mouth daily.    Coenzyme Q10 (CO Q-10) 100 MG CAPS Take 200 mg by mouth daily.    DENTA 5000 PLUS 1.1 % CREA dental cream Take 1 application by mouth daily.   DULERA 200-5 MCG/ACT AERO INHALE 1 PUFF INTO THE LUNGS TWICE DAILY   DULoxetine (CYMBALTA) 60 MG capsule Take 1 capsule (60 mg total) by mouth daily.   famotidine (PEPCID) 40 MG tablet Take 1 tablet (40 mg total) by mouth 2 (two) times daily.   furosemide (LASIX) 40 MG tablet TAKE 1 TABLET BY MOUTH DAILY   gemfibrozil (LOPID) 600 MG tablet Take 1 tablet (600 mg total) by mouth 2 (two) times daily before a meal.   Ipratropium-Albuterol (COMBIVENT RESPIMAT) 20-100 MCG/ACT AERS respimat INHALE 1 PUFF INTO THE LUNGS EVERY 6 HOURS AS NEEDED FOR WHEEZING OR SHORTNESS OF BREATH   ipratropium-albuterol (DUONEB) 0.5-2.5 (3) MG/3ML SOLN Use 1 vial via nebulizer every 4 t0 6 hrs   Magnesium 400 MG TABS Take 400 mg by mouth daily.    mometasone (NASONEX) 50 MCG/ACT nasal spray Place 2 sprays into the nose daily.   montelukast  (SINGULAIR) 10 MG tablet Take 1 tablet (10 mg total) by mouth daily.   Multiple Vitamins-Minerals (MULTIVITAMIN WITH MINERALS) tablet Take 1 tablet by mouth daily.   omeprazole (PRILOSEC) 40 MG capsule Take 1 capsule (40 mg total) by mouth 2 (two) times daily.   OXYGEN Inhale 4 L into the lungs continuous. Continuous oxygen and NIV at night, 2 liters   pt uses [facility] for oxygen supplies   potassium chloride (KLOR-CON) 8 MEQ tablet Take 1 capsule (8 mEq total) by mouth daily.   Potassium Chloride CR (MICRO-K) 8 MEQ CPCR capsule CR Take 1 capsule (8 mEq total) by mouth daily.   revefenacin (YUPELRI) 175 MCG/3ML nebulizer solution USE 1 VIAL IN NEBULIZER DAILY   Roflumilast 250 MCG TABS TAKE 1 TABLET BY MOUTH EVERY DAY   simvastatin (ZOCOR) 20 MG tablet TAKE 1 TABLET(20 MG) BY MOUTH EVERY EVENING FOR CHOLESTEROL   SPIRIVA HANDIHALER 18 MCG inhalation capsule Place 1 capsule (18 mcg total) into inhaler and inhale daily. i   sucralfate (CARAFATE) 1 g tablet TAKE 1 TABLET BY MOUTH FOUR TIMES DAILY AT BEDTIME WITH MEALS   Varenicline Tartrate, Starter, 0.5 MG X 11 & 1 MG X 42 TBPK Take 0.5 mg by mouth once daily on days 1-3, then take 0.5 mg twice daily on days 4-7, then increase to 1 mg twice daily   vitamin E 180 MG (  400 UNITS) capsule Take by mouth.   [DISCONTINUED] ALPRAZolam (XANAX) 0.25 MG tablet Take 1 tablet (0.25 mg total) by mouth 3 (three) times daily as needed for anxiety.   ALPRAZolam (XANAX) 0.25 MG tablet Take 1 tablet (0.25 mg total) by mouth 3 (three) times daily as needed for anxiety.   No facility-administered encounter medications on file as of 12/11/2023.    Surgical History: Past Surgical History:  Procedure Laterality Date   CATARACT EXTRACTION W/PHACO Right 12/04/2023   Procedure: CATARACT EXTRACTION PHACO AND INTRAOCULAR LENS PLACEMENT (IOC) RIGHT  CLAREON VIVITY LENS;  Surgeon: Estanislado Pandy, MD;  Location: [facility];  Service: Ophthalmology;  Laterality: Right;   10.40 1:04.0   COLONOSCOPY WITH PROPOFOL N/A 01/02/2023   Procedure: COLONOSCOPY WITH PROPOFOL;  Surgeon: Toney Reil, MD;  Location: [facility];  Service: Gastroenterology;  Laterality: N/A;   ESOPHAGOGASTRODUODENOSCOPY N/A 07/29/2018   Procedure: ESOPHAGOGASTRODUODENOSCOPY (EGD);  Surgeon: Toledo, Boykin Nearing, MD;  Location: [facility] ENDOSCOPY;  Service: Gastroenterology;  Laterality: N/A;   ESOPHAGOGASTRODUODENOSCOPY (EGD) WITH PROPOFOL N/A 01/02/2023   Procedure: ESOPHAGOGASTRODUODENOSCOPY (EGD) WITH PROPOFOL;  Surgeon: Toney Reil, MD;  Location: [facility] ENDOSCOPY;  Service: Gastroenterology;  Laterality: N/A;   KIDNEY STONE SURGERY     TONSILLECTOMY      Medical History: Past Medical History:  Diagnosis Date   Chronic respiratory failure with hypoxia (HCC)    Cigarette smoker    COPD (chronic obstructive pulmonary disease) (HCC)    Dependence on supplemental oxygen    Depression with anxiety    Endometriosis    GERD (gastroesophageal reflux disease)    Grade I diastolic dysfunction    Hyperlipidemia    Kidney stones    Reactive airway disease 03/08/2014    Family History: Family History  Problem Relation Age of Onset   Hypertension Mother    Hyperlipidemia Mother    GER disease Mother    Heart attack Father        23 yrs ago   Hypertension Father    Parkinson's disease Father    GER disease Father     Social History   Socioeconomic History   Marital status: Married    Spouse name: Not on file   Number of children: Not on file   Years of education: Not on file   Highest education level: Not on file  Occupational History   Not on file  Tobacco Use   Smoking status: Every Day    Current packs/day: 0.30    Average packs/day: 0.5 packs/day for 50.0 years (26.8 ttl pk-yrs)    Types: Cigarettes    Start date: 34   Smokeless tobacco: Never   Tobacco comments:    6 cigarettes daily.  Vaping Use   Vaping status: Former  Substance and Sexual Activity    Alcohol use: Yes    Comment: ocassionally    Drug use: No   Sexual activity: Not on file  Other Topics Concern   Not on file  Social History Narrative   Not on file   Social Drivers of Health   Financial Resource Strain: Not on file  Food Insecurity: Not on file  Transportation Needs: Not on file  Physical Activity: Not on file  Stress: Not on file  Social Connections: Not on file  Intimate Partner Violence: Not on file      Review of Systems  Constitutional:  Positive for fatigue. Negative for chills and unexpected weight change.  HENT:  Negative for congestion, postnasal  drip, rhinorrhea, sneezing and sore throat.   Eyes:  Negative for redness.  Respiratory:  Positive for cough, shortness of breath and wheezing. Negative for chest tightness.   Cardiovascular: Negative.  Negative for chest pain and palpitations.  Gastrointestinal:  Positive for abdominal distention and abdominal pain. Negative for constipation, diarrhea, nausea and vomiting.  Genitourinary:  Negative for dysuria and frequency.  Musculoskeletal:  Negative for arthralgias, back pain, joint swelling and neck pain.  Skin:  Negative for rash.  Neurological: Negative.  Negative for tremors and numbness.  Hematological:  Negative for adenopathy. Does not bruise/bleed easily.  Psychiatric/Behavioral:  Positive for behavioral problems (Depression) and sleep disturbance. Negative for suicidal ideas. The patient is nervous/anxious.     Vital Signs: BP 136/74   Pulse (!) 111   Temp 97.9 F (36.6 C)   Resp 16   Ht 5\' 3"  (1.6 m)   Wt 134 lb 3.2 oz (60.9 kg)   SpO2 97% Comment: 4L  BMI 23.77 kg/m    Physical Exam Vitals reviewed.  Constitutional:      General: She is not in acute distress.    Appearance: Normal appearance. She is normal weight. She is not ill-appearing.  HENT:     Head: Normocephalic and atraumatic.  Eyes:     Pupils: Pupils are equal, round, and reactive to light.  Cardiovascular:      Rate and Rhythm: Normal rate and regular rhythm.     Heart sounds: Normal heart sounds, S1 normal and S2 normal. No murmur heard. Pulmonary:     Effort: Pulmonary effort is normal. No accessory muscle usage or respiratory distress.     Breath sounds: Normal air entry. Wheezing (intermittent) present.  Musculoskeletal:     Right lower leg: No edema.     Left lower leg: No edema.  Neurological:     Mental Status: She is alert and oriented to person, place, and time.  Psychiatric:        Mood and Affect: Mood is anxious. Affect is tearful.        Behavior: Behavior normal. Behavior is cooperative.        Assessment/Plan: 1. COPD with hypoxia (HCC) (Primary) Continue supplemental oxygen use and continue yupelri neb treatments.   2. Chronic respiratory failure with hypoxia (HCC) Continue supplemental oxygen use as instructed.   3. Prediabetes A1c is 6.2, continue to monitor.   4. Mixed hyperlipidemia Take gemfibrozil as prescribed. - gemfibrozil (LOPID) 600 MG tablet; Take 1 tablet (600 mg total) by mouth 2 (two) times daily before a meal.  Dispense: 60 tablet; Refill: 3  5. Impaired fasting glucose Glucose 116, prior elevated A1c 5 years ago, A1c is 6.2  - POCT glycosylated hemoglobin (Hb A1C)  6. Statin myopathy Cannot tolerate statins, gemfibrozil is prescribed.   7. Depression with anxiety Continue prn alprazolam as prescribed. Follow up in 3 months for additional refills.  - ALPRAZolam (XANAX) 0.25 MG tablet; Take 1 tablet (0.25 mg total) by mouth 3 (three) times daily as needed for anxiety.  Dispense: 90 tablet; Refill: 2   General Counseling: Jim verbalizes understanding of the findings of todays visit and agrees with plan of treatment. I have discussed any further diagnostic evaluation that may be needed or ordered today. We also reviewed her medications today. she has been encouraged to call the office with any questions or concerns that should arise related to  todays visit.    Orders Placed This Encounter  Procedures   POCT glycosylated hemoglobin (  Hb A1C)    Meds ordered this encounter  Medications   gemfibrozil (LOPID) 600 MG tablet    Sig: Take 1 tablet (600 mg total) by mouth 2 (two) times daily before a meal.    Dispense:  60 tablet    Refill:  3    Fill new script today   ALPRAZolam (XANAX) 0.25 MG tablet    Sig: Take 1 tablet (0.25 mg total) by mouth 3 (three) times daily as needed for anxiety.    Dispense:  90 tablet    Refill:  2    Please discontinue previous orders of alprazolam, # of tablets changed to 90    Return in about 12 weeks (around 03/04/2024) for F/U, anxiety med refill, Tajah Noguchi PCP.   Total time spent:30 Minutes Time spent includes review of chart, medications, test results, and follow up plan with the patient.   [place] Controlled Substance Database was reviewed by me.  This patient was seen by Sallyanne Kuster, FNP-C in collaboration with Dr. Beverely Risen as a part of collaborative care agreement.   Ladine Kiper R. Tedd Sias, MSN, FNP-C Internal medicine

## 2023-12-16 DIAGNOSIS — M9902 Segmental and somatic dysfunction of thoracic region: Secondary | ICD-10-CM | POA: Diagnosis not present

## 2023-12-16 DIAGNOSIS — M5414 Radiculopathy, thoracic region: Secondary | ICD-10-CM | POA: Diagnosis not present

## 2023-12-16 DIAGNOSIS — M461 Sacroiliitis, not elsewhere classified: Secondary | ICD-10-CM | POA: Diagnosis not present

## 2023-12-16 DIAGNOSIS — M9903 Segmental and somatic dysfunction of lumbar region: Secondary | ICD-10-CM | POA: Diagnosis not present

## 2023-12-16 DIAGNOSIS — M9904 Segmental and somatic dysfunction of sacral region: Secondary | ICD-10-CM | POA: Diagnosis not present

## 2023-12-16 DIAGNOSIS — M5451 Vertebrogenic low back pain: Secondary | ICD-10-CM | POA: Diagnosis not present

## 2023-12-16 NOTE — Discharge Instructions (Signed)

## 2023-12-18 ENCOUNTER — Ambulatory Visit
Admission: RE | Admit: 2023-12-18 | Discharge: 2023-12-18 | Disposition: A | Discharge status: Home / Self Care | Payer: Medicare Other | Attending: Ophthalmology | Admitting: Ophthalmology

## 2023-12-18 ENCOUNTER — Other Ambulatory Visit: Payer: Self-pay

## 2023-12-18 ENCOUNTER — Ambulatory Visit: Payer: Self-pay | Admitting: Anesthesiology

## 2023-12-18 ENCOUNTER — Encounter
Admission: RE | Disposition: A | Discharge status: Home / Self Care | Payer: Self-pay | Source: Home / Self Care | Attending: Ophthalmology

## 2023-12-18 ENCOUNTER — Encounter: Payer: Self-pay | Admitting: Ophthalmology

## 2023-12-18 DIAGNOSIS — H269 Unspecified cataract: Secondary | ICD-10-CM | POA: Diagnosis not present

## 2023-12-18 DIAGNOSIS — E785 Hyperlipidemia, unspecified: Secondary | ICD-10-CM | POA: Diagnosis not present

## 2023-12-18 DIAGNOSIS — H2512 Age-related nuclear cataract, left eye: Secondary | ICD-10-CM | POA: Insufficient documentation

## 2023-12-18 HISTORY — PX: CATARACT EXTRACTION W/PHACO: SHX586

## 2023-12-18 SURGERY — CATARACT EXTRACTION PHACO AND INTRAOCULAR LENS PLACEMENT (IOC)
Anesthesia: Monitor Anesthesia Care | Laterality: Left

## 2023-12-18 MED ORDER — BRIMONIDINE TARTRATE-TIMOLOL 0.2-0.5 % OP SOLN
OPHTHALMIC | Status: DC | PRN
  Administered 2023-12-18: 1 [drp] via OPHTHALMIC

## 2023-12-18 MED ORDER — FENTANYL CITRATE (PF) 100 MCG/2ML IJ SOLN
INTRAMUSCULAR | Status: AC
  Filled 2023-12-18: qty 2

## 2023-12-18 MED ORDER — FENTANYL CITRATE (PF) 100 MCG/2ML IJ SOLN
INTRAMUSCULAR | Status: DC | PRN
  Administered 2023-12-18: 50 ug via INTRAVENOUS

## 2023-12-18 MED ORDER — TETRACAINE HCL 0.5 % OP SOLN
1.0000 [drp] | OPHTHALMIC | Status: DC | PRN
  Administered 2023-12-18 (×3): 1 [drp] via OPHTHALMIC

## 2023-12-18 MED ORDER — SIGHTPATH DOSE#1 BSS IO SOLN
INTRAOCULAR | Status: DC | PRN
  Administered 2023-12-18: 105 mL via OPHTHALMIC

## 2023-12-18 MED ORDER — SIGHTPATH DOSE#1 BSS IO SOLN
INTRAOCULAR | Status: DC | PRN
  Administered 2023-12-18: 15 mL via INTRAOCULAR

## 2023-12-18 MED ORDER — MIDAZOLAM HCL 2 MG/2ML IJ SOLN
INTRAMUSCULAR | Status: AC
  Filled 2023-12-18: qty 2

## 2023-12-18 MED ORDER — MOXIFLOXACIN HCL 0.5 % OP SOLN
OPHTHALMIC | Status: DC | PRN
  Administered 2023-12-18: .2 mL via OPHTHALMIC

## 2023-12-18 MED ORDER — TETRACAINE HCL 0.5 % OP SOLN
OPHTHALMIC | Status: AC
  Filled 2023-12-18: qty 4

## 2023-12-18 MED ORDER — LIDOCAINE HCL (PF) 2 % IJ SOLN
INTRAOCULAR | Status: DC | PRN
  Administered 2023-12-18: 1 mL via INTRAOCULAR

## 2023-12-18 MED ORDER — SIGHTPATH DOSE#1 NA HYALUR & NA CHOND-NA HYALUR IO KIT
PACK | INTRAOCULAR | Status: DC | PRN
  Administered 2023-12-18: 1 via OPHTHALMIC

## 2023-12-18 MED ORDER — ARMC OPHTHALMIC DILATING DROPS
OPHTHALMIC | Status: AC
Start: 2023-12-18 — End: ?
  Filled 2023-12-18: qty 0.5

## 2023-12-18 MED ORDER — ARMC OPHTHALMIC DILATING DROPS
1.0000 | OPHTHALMIC | Status: DC | PRN
  Administered 2023-12-18 (×3): 1 via OPHTHALMIC

## 2023-12-18 MED ORDER — MIDAZOLAM HCL 5 MG/5ML IJ SOLN
INTRAMUSCULAR | Status: DC | PRN
Start: 2023-12-18 — End: 2023-12-18
  Administered 2023-12-18: 1 mg via INTRAVENOUS

## 2023-12-18 SURGICAL SUPPLY — 6 items
CATARACT SUITE SIGHTPATH (MISCELLANEOUS) ×1 IMPLANT
DISSECTOR HYDRO NUCLEUS 50X22 (MISCELLANEOUS) ×1
DRSG TEGADERM 2-3/8X2-3/4 SM (GAUZE/BANDAGES/DRESSINGS) ×1
GLOVE SURG SYN 7.5 E (GLOVE) ×1 IMPLANT
GLOVE SURG SYN 8.5 E (GLOVE) ×1 IMPLANT
LENS CLRN VIVITY TORIC 3 16.5 ×1 IMPLANT

## 2023-12-18 NOTE — Anesthesia Postprocedure Evaluation (Signed)
Anesthesia Post Note  Patient: Rachael Blankenship  Procedure(s) Performed: CATARACT EXTRACTION PHACO AND INTRAOCULAR LENS PLACEMENT (IOC) LEFT  CLAREON VIVITY TORIC 16.28, 01:19.2 (Left)  Patient location during evaluation: PACU Anesthesia Type: MAC Level of consciousness: awake and alert Pain management: pain level controlled Vital Signs Assessment: post-procedure vital signs reviewed and stable Respiratory status: spontaneous breathing, nonlabored ventilation, respiratory function stable and patient connected to nasal cannula oxygen Cardiovascular status: stable and blood pressure returned to baseline Postop Assessment: no apparent nausea or vomiting Anesthetic complications: no   No notable events documented.   Last Vitals:  Vitals:   12/18/23 0836 12/18/23 0840  BP: 132/69 132/76  Pulse: (!) 101 100  Resp: 18 (!) 24  Temp: 36.8 C   SpO2: 96% 96%    Last Pain:  Vitals:   12/18/23 0840  TempSrc:   PainSc: 0-No pain                 Corinda Gubler

## 2023-12-18 NOTE — Op Note (Signed)
OPERATIVE NOTE  Rachael Blankenship [id number] 12/18/2023   PREOPERATIVE DIAGNOSIS: Nuclear sclerotic cataract left eye. H25.12   POSTOPERATIVE DIAGNOSIS: Nuclear sclerotic cataract left eye. H25.12   PROCEDURE:  Phacoemusification with Toric posterior chamber intraocular lens placement of the left eye  Ultrasound time: Procedure(s): CATARACT EXTRACTION PHACO AND INTRAOCULAR LENS PLACEMENT (IOC) LEFT  CLAREON VIVITY TORIC 16.28, 01:19.2 (Left)  LENS:   Implant Name Type Inv. Item Serial No. Manufacturer Lot No. LRB No. Used Action  LENS CLRN VIVITY TORIC 3 16.5 - [id number]  LENS CLRN VIVITY TORIC 3 16.5 [id number] [employer]  Left 1 Implanted    [id number] Toric intraocular lens with 1.50 diopters of cylindrical power with axis orientation at 085 degrees.  SURGEON:  Julious Payer. Rolley Sims, MD   ANESTHESIA:  Topical with tetracaine drops, augmented with 1% preservative-free intracameral lidocaine.   COMPLICATIONS:  None.   DESCRIPTION OF PROCEDURE:  The patient was identified in the holding room and transported to the operating room and placed in the supine position under the operating microscope.  The left eye was identified as the operative eye, which was prepped and draped in the usual sterile ophthalmic fashion.   A 1 millimeter clear-corneal paracentesis was made inferotemporally. Preservative-free 1% lidocaine mixed with 1:1,000 bisulfite-free aqueous solution of epinephrine was injected into the anterior chamber. The anterior chamber was then filled with Viscoat viscoelastic. A 2.4 millimeter keratome was used to make a clear-corneal incision superotemporally. A curvilinear capsulorrhexis was made with a cystotome and capsulorrhexis forceps. Balanced salt solution was used to hydrodissect and hydrodelineate the nucleus. Phacoemulsification was then used to remove the lens nucleus and epinucleus. The remaining cortex was then removed using the irrigation and aspiration handpiece. Provisc was then  placed into the capsular bag to distend it for lens placement. The Verion digital marker was used to align the implant at the intended axis.  A +16.50 D [id number] Toric lens was then injected into the capsular bag.  It was rotated clockwise until the axis marks on the lens were approximately 15 degrees in the counterclockwise direction to the intended alignment.  The viscoelastic was aspirated from the eye using the irrigation aspiration handpiece.  Then, a blunt chopper through the sideport incision was used to rotate the lens in a clockwise direction until the axis markings of the intraocular lens were lined up with the Verion alignment.  Balanced salt solution was then used to hydrate the wounds.   The anterior chamber was inflated to a physiologic pressure with balanced salt solution.  No wound leaks were noted. Moxifloxacin was injected intracamerally.  Timolol and Brimonidine drops were applied to the eye.  The patient was taken to the recovery room in stable condition without complications of anesthesia or surgery.  Rachael Blankenship 12/18/2023, 8:32 AM

## 2023-12-18 NOTE — Transfer of Care (Signed)
Immediate Anesthesia Transfer of Care Note  Patient: Rachael Blankenship  Procedure(s) Performed: CATARACT EXTRACTION PHACO AND INTRAOCULAR LENS PLACEMENT (IOC) LEFT  CLAREON VIVITY TORIC 16.28, 01:19.2 (Left)  Patient Location: PACU  Anesthesia Type: MAC  Level of Consciousness: awake, alert  and patient cooperative  Airway and Oxygen Therapy: Patient Spontanous Breathing and Patient connected to supplemental oxygen  Post-op Assessment: Post-op Vital signs reviewed, Patient's Cardiovascular Status Stable, Respiratory Function Stable, Patent Airway and No signs of Nausea or vomiting  Post-op Vital Signs: Reviewed and stable  Complications: No notable events documented.

## 2023-12-18 NOTE — H&P (Signed)
[facility]   Primary Care Physician:  Sallyanne Kuster, NP Ophthalmologist: Dr. Deberah Pelton  Pre-Procedure History & Physical: HPI:  Rachael Blankenship is a 68 y.o. female here for cataract surgery.   Past Medical History:  Diagnosis Date   Chronic respiratory failure with hypoxia (HCC)    Cigarette smoker    COPD (chronic obstructive pulmonary disease) (HCC)    Dependence on supplemental oxygen    Depression with anxiety    Endometriosis    GERD (gastroesophageal reflux disease)    Grade I diastolic dysfunction    Hyperlipidemia    Kidney stones    Reactive airway disease 03/08/2014    Past Surgical History:  Procedure Laterality Date   CATARACT EXTRACTION W/PHACO Right 12/04/2023   Procedure: CATARACT EXTRACTION PHACO AND INTRAOCULAR LENS PLACEMENT (IOC) RIGHT  CLAREON VIVITY LENS;  Surgeon: Estanislado Pandy, MD;  Location: [facility];  Service: Ophthalmology;  Laterality: Right;  10.40 1:04.0   COLONOSCOPY WITH PROPOFOL N/A 01/02/2023   Procedure: COLONOSCOPY WITH PROPOFOL;  Surgeon: Toney Reil, MD;  Location: [facility] ENDOSCOPY;  Service: Gastroenterology;  Laterality: N/A;   ESOPHAGOGASTRODUODENOSCOPY N/A 07/29/2018   Procedure: ESOPHAGOGASTRODUODENOSCOPY (EGD);  Surgeon: Toledo, Boykin Nearing, MD;  Location: [facility] ENDOSCOPY;  Service: Gastroenterology;  Laterality: N/A;   ESOPHAGOGASTRODUODENOSCOPY (EGD) WITH PROPOFOL N/A 01/02/2023   Procedure: ESOPHAGOGASTRODUODENOSCOPY (EGD) WITH PROPOFOL;  Surgeon: Toney Reil, MD;  Location: [facility] ENDOSCOPY;  Service: Gastroenterology;  Laterality: N/A;   KIDNEY STONE SURGERY     TONSILLECTOMY      Prior to Admission medications   Medication Sig Start Date End Date Taking? Authorizing Provider  ALPRAZolam (XANAX) 0.25 MG tablet Take 1 tablet (0.25 mg total) by mouth 3 (three) times daily as needed for anxiety. 12/11/23  Yes Abernathy, Arlyss Repress, NP  ascorbic acid (VITAMIN C) 500 MG tablet Take 500 mg by mouth  daily.   Yes [provider]  aspirin 81 MG tablet Take 81 mg by mouth at bedtime.    Yes [provider]  Cholecalciferol 25 MCG (1000 UT) tablet Take 1,000 Units by mouth daily.    Yes [provider]  Coenzyme Q10 (CO Q-10) 100 MG CAPS Take 200 mg by mouth daily.    Yes [provider]  DENTA 5000 PLUS 1.1 % CREA dental cream Take 1 application by mouth daily. 04/14/21  Yes [provider]  DULERA 200-5 MCG/ACT AERO INHALE 1 PUFF INTO THE LUNGS TWICE DAILY 05/23/23  Yes Abernathy, Alyssa, NP  DULoxetine (CYMBALTA) 60 MG capsule Take 1 capsule (60 mg total) by mouth daily. 11/02/23  Yes Lyndon Code, MD  famotidine (PEPCID) 40 MG tablet Take 1 tablet (40 mg total) by mouth 2 (two) times daily. 07/02/23  Yes Abernathy, Arlyss Repress, NP  furosemide (LASIX) 40 MG tablet TAKE 1 TABLET BY MOUTH DAILY 02/24/23  Yes Abernathy, Alyssa, NP  gemfibrozil (LOPID) 600 MG tablet Take 1 tablet (600 mg total) by mouth 2 (two) times daily before a meal. 12/11/23  Yes Abernathy, Alyssa, NP  Ipratropium-Albuterol (COMBIVENT RESPIMAT) 20-100 MCG/ACT AERS respimat INHALE 1 PUFF INTO THE LUNGS EVERY 6 HOURS AS NEEDED FOR WHEEZING OR SHORTNESS OF BREATH 10/31/23  Yes Lyndon Code, MD  ipratropium-albuterol (DUONEB) 0.5-2.5 (3) MG/3ML SOLN Use 1 vial via nebulizer every 4 t0 6 hrs 09/13/21  Yes Abernathy, Alyssa, NP  Magnesium 400 MG TABS Take 400 mg by mouth daily.    Yes [provider]  mometasone (NASONEX) 50 MCG/ACT nasal  spray Place 2 sprays into the nose daily.   Yes [provider]  montelukast (SINGULAIR) 10 MG tablet Take 1 tablet (10 mg total) by mouth daily. 01/31/23  Yes Abernathy, Arlyss Repress, NP  Multiple Vitamins-Minerals (MULTIVITAMIN WITH MINERALS) tablet Take 1 tablet by mouth daily.   Yes [provider]  omeprazole (PRILOSEC) 40 MG capsule Take 1 capsule (40 mg total) by mouth 2 (two) times daily. 12/04/23  Yes Abernathy, Alyssa, NP  OXYGEN Inhale 4 L  into the lungs continuous. Continuous oxygen and NIV at night, 2 liters   pt uses [facility] for oxygen supplies   Yes [provider]  potassium chloride (KLOR-CON) 8 MEQ tablet Take 1 capsule (8 mEq total) by mouth daily. 10/20/23  Yes Abernathy, Arlyss Repress, NP  revefenacin (YUPELRI) 175 MCG/3ML nebulizer solution USE 1 VIAL IN NEBULIZER DAILY 08/11/23  Yes Abernathy, Alyssa, NP  Roflumilast 250 MCG TABS TAKE 1 TABLET BY MOUTH EVERY DAY 11/27/23  Yes Abernathy, Alyssa, NP  simvastatin (ZOCOR) 20 MG tablet TAKE 1 TABLET(20 MG) BY MOUTH EVERY EVENING FOR CHOLESTEROL 11/06/23  Yes Abernathy, Alyssa, NP  SPIRIVA HANDIHALER 18 MCG inhalation capsule Place 1 capsule (18 mcg total) into inhaler and inhale daily. i 12/09/23  Yes Abernathy, Alyssa, NP  sucralfate (CARAFATE) 1 g tablet TAKE 1 TABLET BY MOUTH FOUR TIMES DAILY AT BEDTIME WITH MEALS 06/19/23  Yes Abernathy, Arlyss Repress, NP  Varenicline Tartrate, Starter, 0.5 MG X 11 & 1 MG X 42 TBPK Take 0.5 mg by mouth once daily on days 1-3, then take 0.5 mg twice daily on days 4-7, then increase to 1 mg twice daily 08/13/23  Yes Abernathy, Alyssa, NP  vitamin E 180 MG (400 UNITS) capsule Take by mouth.   Yes [provider]  Potassium Chloride CR (MICRO-K) 8 MEQ CPCR capsule CR Take 1 capsule (8 mEq total) by mouth daily. 10/26/23   Lyndon Code, MD    Allergies as of 10/27/2023 - Review Complete 10/08/2023  Allergen Reaction Noted   Naproxen Itching 05/16/2015   Sulfa antibiotics Hives and Rash 03/08/2014    Family History  Problem Relation Age of Onset   Hypertension Mother    Hyperlipidemia Mother    GER disease Mother    Heart attack Father        23 yrs ago   Hypertension Father    Parkinson's disease Father    GER disease Father     Social History   Socioeconomic History   Marital status: Married    Spouse name: Not on file   Number of children: Not on file   Years of education: Not on file   Highest education level: Not on file   Occupational History   Not on file  Tobacco Use   Smoking status: Every Day    Current packs/day: 0.30    Average packs/day: 0.5 packs/day for 50.1 years (26.8 ttl pk-yrs)    Types: Cigarettes    Start date: 1975   Smokeless tobacco: Never   Tobacco comments:    6 cigarettes daily.  Vaping Use   Vaping status: Former  Substance and Sexual Activity   Alcohol use: Yes    Comment: ocassionally    Drug use: No   Sexual activity: Not on file  Other Topics Concern   Not on file  Social History Narrative   Not on file   Social Drivers of Health   Financial Resource Strain: Not on file  Food Insecurity: Not on file  Transportation Needs: Not on file  Physical Activity: Not on file  Stress: Not on file  Social Connections: Not on file  Intimate Partner Violence: Not on file    Review of Systems: See HPI, otherwise negative ROS  Physical Exam: BP 128/67   Pulse (!) 111   Temp 98.1 F (36.7 C) (Temporal)   Resp 13   Ht 5\' 3"  (1.6 m)   Wt 60.3 kg   SpO2 94%   BMI 23.56 kg/m  General:   Alert, cooperative in NAD Head:  Normocephalic and atraumatic. Respiratory:  Normal work of breathing. Cardiovascular:  RRR  Impression/Plan: Rachael Blankenship is here for cataract surgery.  Risks, benefits, limitations, and alternatives regarding cataract surgery have been reviewed with the patient.  Questions have been answered.  All parties agreeable.   Estanislado Pandy, MD  12/18/2023, 7:14 AM

## 2023-12-18 NOTE — Anesthesia Preprocedure Evaluation (Signed)
Anesthesia Evaluation  Patient identified by MRN, date of birth, ID band Patient awake    Reviewed: Allergy & Precautions, H&P , NPO status , Patient's Chart, lab work & pertinent test results  Airway Mallampati: IV  TM Distance: >3 FB Neck ROM: Full    Dental no notable dental hx.  Veneers upper central incisors:   Pulmonary shortness of breath, COPD, Current Smoker and Patient abstained from smoking.   Pulmonary exam normal breath sounds clear to auscultation       Cardiovascular negative cardio ROS Normal cardiovascular exam Rhythm:Regular Rate:Normal  01-24-19 echo:  AORTIC ROOT          Size:Normal    Dissection:No dissection   AORTIC VALVE      Leaflets:Tricuspid           Morphology:Normal      Mobility:Fully mobile   LEFT VENTRICLE          Size:Normal                Anterior:Normal   Contraction:Normal                 Lateral:Normal    Closest EF:>55% (Estimated)        Septal:Normal     LV Masses:No Masses               Apical:Normal           EXB:MWUX                  Inferior:Normal                                    Posterior:Normal  Dias.FxClass:(Grade 1) relaxation abnormal, E/A reversal   MITRAL VALVE      Leaflets:Normal                Mobility:Fully mobile    Morphology:Normal   LEFT ATRIUM          Size:Normal               LA Masses:No masses     IA Septum:Normal IAS   MAIN PA          Size:Normal   PULMONIC VALVE    Morphology:Normal                Mobility:Fully mobile   RIGHT VENTRICLE     RV Masses:No Masses                 Size:Normal     Free Wall:Normal             Contraction:Normal   TRICUSPID VALVE      Leaflets:Normal                Mobility:Fully mobile    Morphology:Normal   RIGHT ATRIUM          Size:Normal                RA Other:None       RA Mass:No masses   PERICARDIUM        Fluid:No effusion   INFERIOR VENACAVA          Size:Normal Normal respiratory collapse      Neuro/Psych  PSYCHIATRIC DISORDERS Anxiety Depression    negative neurological ROS  negative psych ROS   GI/Hepatic negative GI ROS, Neg liver ROS,GERD  ,,  Endo/Other  negative endocrine ROS  Renal/GU Renal diseasenegative Renal ROS  negative genitourinary   Musculoskeletal negative musculoskeletal ROS (+) Arthritis ,    Abdominal   Peds negative pediatric ROS (+)  Hematology negative hematology ROS (+)   Anesthesia Other Findings COPD (chronic obstructive pulmonary disease)  Kidney stones Endometriosis  Hyperlipidemia GERD (gastroesophageal reflux disease) Reactive airway disease Depression with anxiety Grade I diastolic dysfunction Quit smoking 08-01-23  Chronic respiratory failure with hypoxia COPD with hypoxia Supplemental oxygen 4 L/m/n/c    Reproductive/Obstetrics negative OB ROS                              Anesthesia Physical Anesthesia Plan  ASA: 3  Anesthesia Plan: MAC   Post-op Pain Management:    Induction: Intravenous  PONV Risk Score and Plan: 1 and Midazolam  Airway Management Planned: Nasal Cannula  Additional Equipment:   Intra-op Plan:   Post-operative Plan:   Informed Consent: I have reviewed the patients History and Physical, chart, labs and discussed the procedure including the risks, benefits and alternatives for the proposed anesthesia with the patient or authorized representative who has indicated his/her understanding and acceptance.       Plan Discussed with: CRNA and Surgeon  Anesthesia Plan Comments: (Explained risks of anesthesia, including PONV, and rare emergencies such as cardiac events, respiratory problems, and allergic reactions, requiring invasive intervention. Discussed the role of CRNA in patient's perioperative care. Patient understands.  Patient counseled on benefits of smoking cessation, and increased perioperative risks associated with continued smoking. )          Anesthesia Quick Evaluation

## 2023-12-19 ENCOUNTER — Emergency Department: Payer: Medicare Other

## 2023-12-19 ENCOUNTER — Other Ambulatory Visit: Payer: Self-pay

## 2023-12-19 ENCOUNTER — Inpatient Hospital Stay
Admission: EM | Admit: 2023-12-19 | Discharge: 2023-12-26 | DRG: 853 | Disposition: A | Discharge status: Home / Self Care | Payer: Medicare Other | Attending: Internal Medicine | Admitting: Internal Medicine

## 2023-12-19 ENCOUNTER — Telehealth: Payer: Self-pay

## 2023-12-19 ENCOUNTER — Encounter: Payer: Self-pay | Admitting: Ophthalmology

## 2023-12-19 DIAGNOSIS — Z8249 Family history of ischemic heart disease and other diseases of the circulatory system: Secondary | ICD-10-CM

## 2023-12-19 DIAGNOSIS — Z9981 Dependence on supplemental oxygen: Secondary | ICD-10-CM

## 2023-12-19 DIAGNOSIS — J441 Chronic obstructive pulmonary disease with (acute) exacerbation: Secondary | ICD-10-CM | POA: Diagnosis present

## 2023-12-19 DIAGNOSIS — Z87442 Personal history of urinary calculi: Secondary | ICD-10-CM

## 2023-12-19 DIAGNOSIS — I700 Atherosclerosis of aorta: Secondary | ICD-10-CM | POA: Diagnosis not present

## 2023-12-19 DIAGNOSIS — J9601 Acute respiratory failure with hypoxia: Secondary | ICD-10-CM | POA: Diagnosis present

## 2023-12-19 DIAGNOSIS — R739 Hyperglycemia, unspecified: Secondary | ICD-10-CM | POA: Diagnosis not present

## 2023-12-19 DIAGNOSIS — F419 Anxiety disorder, unspecified: Secondary | ICD-10-CM | POA: Diagnosis present

## 2023-12-19 DIAGNOSIS — Z79899 Other long term (current) drug therapy: Secondary | ICD-10-CM

## 2023-12-19 DIAGNOSIS — E860 Dehydration: Secondary | ICD-10-CM | POA: Diagnosis present

## 2023-12-19 DIAGNOSIS — J439 Emphysema, unspecified: Secondary | ICD-10-CM | POA: Diagnosis not present

## 2023-12-19 DIAGNOSIS — Z1152 Encounter for screening for COVID-19: Secondary | ICD-10-CM | POA: Diagnosis not present

## 2023-12-19 DIAGNOSIS — E785 Hyperlipidemia, unspecified: Secondary | ICD-10-CM | POA: Diagnosis present

## 2023-12-19 DIAGNOSIS — Z886 Allergy status to analgesic agent status: Secondary | ICD-10-CM

## 2023-12-19 DIAGNOSIS — J13 Pneumonia due to Streptococcus pneumoniae: Secondary | ICD-10-CM | POA: Insufficient documentation

## 2023-12-19 DIAGNOSIS — J9622 Acute and chronic respiratory failure with hypercapnia: Secondary | ICD-10-CM | POA: Diagnosis present

## 2023-12-19 DIAGNOSIS — J09X1 Influenza due to identified novel influenza A virus with pneumonia: Secondary | ICD-10-CM | POA: Diagnosis not present

## 2023-12-19 DIAGNOSIS — F17210 Nicotine dependence, cigarettes, uncomplicated: Secondary | ICD-10-CM | POA: Diagnosis present

## 2023-12-19 DIAGNOSIS — Z882 Allergy status to sulfonamides status: Secondary | ICD-10-CM | POA: Diagnosis not present

## 2023-12-19 DIAGNOSIS — Z83438 Family history of other disorder of lipoprotein metabolism and other lipidemia: Secondary | ICD-10-CM

## 2023-12-19 DIAGNOSIS — H2512 Age-related nuclear cataract, left eye: Secondary | ICD-10-CM | POA: Diagnosis present

## 2023-12-19 DIAGNOSIS — Z820 Family history of epilepsy and other diseases of the nervous system: Secondary | ICD-10-CM | POA: Diagnosis not present

## 2023-12-19 DIAGNOSIS — R0603 Acute respiratory distress: Secondary | ICD-10-CM | POA: Diagnosis not present

## 2023-12-19 DIAGNOSIS — J9621 Acute and chronic respiratory failure with hypoxia: Secondary | ICD-10-CM | POA: Diagnosis present

## 2023-12-19 DIAGNOSIS — J9600 Acute respiratory failure, unspecified whether with hypoxia or hypercapnia: Secondary | ICD-10-CM | POA: Diagnosis not present

## 2023-12-19 DIAGNOSIS — J9612 Chronic respiratory failure with hypercapnia: Secondary | ICD-10-CM | POA: Diagnosis not present

## 2023-12-19 DIAGNOSIS — R404 Transient alteration of awareness: Secondary | ICD-10-CM | POA: Diagnosis not present

## 2023-12-19 DIAGNOSIS — J101 Influenza due to other identified influenza virus with other respiratory manifestations: Secondary | ICD-10-CM | POA: Diagnosis not present

## 2023-12-19 DIAGNOSIS — B953 Streptococcus pneumoniae as the cause of diseases classified elsewhere: Secondary | ICD-10-CM | POA: Diagnosis present

## 2023-12-19 DIAGNOSIS — R6520 Severe sepsis without septic shock: Secondary | ICD-10-CM | POA: Diagnosis present

## 2023-12-19 DIAGNOSIS — Z7951 Long term (current) use of inhaled steroids: Secondary | ICD-10-CM | POA: Diagnosis not present

## 2023-12-19 DIAGNOSIS — R918 Other nonspecific abnormal finding of lung field: Secondary | ICD-10-CM | POA: Diagnosis not present

## 2023-12-19 DIAGNOSIS — J440 Chronic obstructive pulmonary disease with acute lower respiratory infection: Secondary | ICD-10-CM | POA: Diagnosis present

## 2023-12-19 DIAGNOSIS — Z7982 Long term (current) use of aspirin: Secondary | ICD-10-CM | POA: Diagnosis not present

## 2023-12-19 DIAGNOSIS — R000 Tachycardia, unspecified: Secondary | ICD-10-CM | POA: Diagnosis not present

## 2023-12-19 DIAGNOSIS — A419 Sepsis, unspecified organism: Secondary | ICD-10-CM | POA: Diagnosis not present

## 2023-12-19 DIAGNOSIS — J189 Pneumonia, unspecified organism: Secondary | ICD-10-CM | POA: Diagnosis not present

## 2023-12-19 DIAGNOSIS — R55 Syncope and collapse: Secondary | ICD-10-CM | POA: Diagnosis not present

## 2023-12-19 DIAGNOSIS — I2489 Other forms of acute ischemic heart disease: Secondary | ICD-10-CM | POA: Diagnosis present

## 2023-12-19 DIAGNOSIS — R531 Weakness: Secondary | ICD-10-CM | POA: Diagnosis not present

## 2023-12-19 DIAGNOSIS — J1100 Influenza due to unidentified influenza virus with unspecified type of pneumonia: Secondary | ICD-10-CM | POA: Diagnosis not present

## 2023-12-19 DIAGNOSIS — J1008 Influenza due to other identified influenza virus with other specified pneumonia: Secondary | ICD-10-CM | POA: Diagnosis present

## 2023-12-19 DIAGNOSIS — A491 Streptococcal infection, unspecified site: Secondary | ICD-10-CM | POA: Diagnosis not present

## 2023-12-19 DIAGNOSIS — T380X5A Adverse effect of glucocorticoids and synthetic analogues, initial encounter: Secondary | ICD-10-CM | POA: Diagnosis not present

## 2023-12-19 DIAGNOSIS — R0902 Hypoxemia: Secondary | ICD-10-CM | POA: Diagnosis not present

## 2023-12-19 DIAGNOSIS — R0689 Other abnormalities of breathing: Secondary | ICD-10-CM | POA: Diagnosis not present

## 2023-12-19 DIAGNOSIS — R7989 Other specified abnormal findings of blood chemistry: Secondary | ICD-10-CM | POA: Insufficient documentation

## 2023-12-19 DIAGNOSIS — K219 Gastro-esophageal reflux disease without esophagitis: Secondary | ICD-10-CM | POA: Diagnosis present

## 2023-12-19 DIAGNOSIS — R069 Unspecified abnormalities of breathing: Secondary | ICD-10-CM | POA: Diagnosis not present

## 2023-12-19 LAB — CBC WITH DIFFERENTIAL/PLATELET
Abs Immature Granulocytes: 0.06 10*3/uL (ref 0.00–0.07)
Basophils Absolute: 0 10*3/uL (ref 0.0–0.1)
Basophils Relative: 0 %
Eosinophils Absolute: 0 10*3/uL (ref 0.0–0.5)
Eosinophils Relative: 0 %
HCT: 42.3 % (ref 36.0–46.0)
Hemoglobin: 13.5 g/dL (ref 12.0–15.0)
Immature Granulocytes: 0 %
Lymphocytes Relative: 15 %
Lymphs Abs: 2.4 10*3/uL (ref 0.7–4.0)
MCH: 27.4 pg (ref 26.0–34.0)
MCHC: 31.9 g/dL (ref 30.0–36.0)
MCV: 85.8 fL (ref 80.0–100.0)
Monocytes Absolute: 1.4 10*3/uL — ABNORMAL HIGH (ref 0.1–1.0)
Monocytes Relative: 9 %
Neutro Abs: 12.2 10*3/uL — ABNORMAL HIGH (ref 1.7–7.7)
Neutrophils Relative %: 76 %
Platelets: 374 10*3/uL (ref 150–400)
RBC: 4.93 MIL/uL (ref 3.87–5.11)
RDW: 13.9 % (ref 11.5–15.5)
WBC: 16 10*3/uL — ABNORMAL HIGH (ref 4.0–10.5)
nRBC: 0 % (ref 0.0–0.2)

## 2023-12-19 MED ORDER — AZITHROMYCIN 250 MG PO TABS: ORAL_TABLET | ORAL | 0 refills | Status: DC

## 2023-12-19 MED ORDER — SODIUM CHLORIDE 0.9 % IV BOLUS
1000.0000 mL | Freq: Once | INTRAVENOUS | Status: AC
  Administered 2023-12-19: 1000 mL via INTRAVENOUS

## 2023-12-19 MED ORDER — ALBUTEROL SULFATE (2.5 MG/3ML) 0.083% IN NEBU
INHALATION_SOLUTION | RESPIRATORY_TRACT | Status: AC
  Filled 2023-12-19: qty 9

## 2023-12-19 NOTE — Telephone Encounter (Signed)
Pt called that she is having running nose and sinusitis  and coughing,covid test is negative as per lauren sent Zpak  and advised not feeling better need appt

## 2023-12-20 ENCOUNTER — Other Ambulatory Visit: Payer: Self-pay

## 2023-12-20 ENCOUNTER — Encounter: Payer: Self-pay | Admitting: Family Medicine

## 2023-12-20 ENCOUNTER — Inpatient Hospital Stay: Payer: Medicare Other

## 2023-12-20 DIAGNOSIS — Z83438 Family history of other disorder of lipoprotein metabolism and other lipidemia: Secondary | ICD-10-CM | POA: Diagnosis not present

## 2023-12-20 DIAGNOSIS — E860 Dehydration: Secondary | ICD-10-CM | POA: Diagnosis present

## 2023-12-20 DIAGNOSIS — Z9981 Dependence on supplemental oxygen: Secondary | ICD-10-CM | POA: Diagnosis not present

## 2023-12-20 DIAGNOSIS — R6520 Severe sepsis without septic shock: Secondary | ICD-10-CM | POA: Diagnosis present

## 2023-12-20 DIAGNOSIS — J9621 Acute and chronic respiratory failure with hypoxia: Secondary | ICD-10-CM | POA: Diagnosis present

## 2023-12-20 DIAGNOSIS — J189 Pneumonia, unspecified organism: Secondary | ICD-10-CM | POA: Diagnosis not present

## 2023-12-20 DIAGNOSIS — J9601 Acute respiratory failure with hypoxia: Secondary | ICD-10-CM | POA: Diagnosis present

## 2023-12-20 DIAGNOSIS — Z886 Allergy status to analgesic agent status: Secondary | ICD-10-CM | POA: Diagnosis not present

## 2023-12-20 DIAGNOSIS — J09X1 Influenza due to identified novel influenza A virus with pneumonia: Secondary | ICD-10-CM | POA: Diagnosis not present

## 2023-12-20 DIAGNOSIS — R739 Hyperglycemia, unspecified: Secondary | ICD-10-CM | POA: Diagnosis not present

## 2023-12-20 DIAGNOSIS — E785 Hyperlipidemia, unspecified: Secondary | ICD-10-CM | POA: Diagnosis present

## 2023-12-20 DIAGNOSIS — Z820 Family history of epilepsy and other diseases of the nervous system: Secondary | ICD-10-CM | POA: Diagnosis not present

## 2023-12-20 DIAGNOSIS — Z882 Allergy status to sulfonamides status: Secondary | ICD-10-CM | POA: Diagnosis not present

## 2023-12-20 DIAGNOSIS — J1008 Influenza due to other identified influenza virus with other specified pneumonia: Secondary | ICD-10-CM | POA: Diagnosis present

## 2023-12-20 DIAGNOSIS — J439 Emphysema, unspecified: Secondary | ICD-10-CM | POA: Diagnosis not present

## 2023-12-20 DIAGNOSIS — Z7982 Long term (current) use of aspirin: Secondary | ICD-10-CM | POA: Diagnosis not present

## 2023-12-20 DIAGNOSIS — J9612 Chronic respiratory failure with hypercapnia: Secondary | ICD-10-CM | POA: Diagnosis not present

## 2023-12-20 DIAGNOSIS — Z7951 Long term (current) use of inhaled steroids: Secondary | ICD-10-CM | POA: Diagnosis not present

## 2023-12-20 DIAGNOSIS — J13 Pneumonia due to Streptococcus pneumoniae: Secondary | ICD-10-CM | POA: Diagnosis present

## 2023-12-20 DIAGNOSIS — J101 Influenza due to other identified influenza virus with other respiratory manifestations: Secondary | ICD-10-CM | POA: Diagnosis not present

## 2023-12-20 DIAGNOSIS — B953 Streptococcus pneumoniae as the cause of diseases classified elsewhere: Secondary | ICD-10-CM | POA: Diagnosis present

## 2023-12-20 DIAGNOSIS — J9622 Acute and chronic respiratory failure with hypercapnia: Secondary | ICD-10-CM | POA: Diagnosis present

## 2023-12-20 DIAGNOSIS — F419 Anxiety disorder, unspecified: Secondary | ICD-10-CM | POA: Diagnosis present

## 2023-12-20 DIAGNOSIS — Z8249 Family history of ischemic heart disease and other diseases of the circulatory system: Secondary | ICD-10-CM | POA: Diagnosis not present

## 2023-12-20 DIAGNOSIS — I700 Atherosclerosis of aorta: Secondary | ICD-10-CM | POA: Diagnosis not present

## 2023-12-20 DIAGNOSIS — F17210 Nicotine dependence, cigarettes, uncomplicated: Secondary | ICD-10-CM | POA: Diagnosis present

## 2023-12-20 DIAGNOSIS — R7989 Other specified abnormal findings of blood chemistry: Secondary | ICD-10-CM | POA: Diagnosis not present

## 2023-12-20 DIAGNOSIS — R0902 Hypoxemia: Secondary | ICD-10-CM | POA: Diagnosis not present

## 2023-12-20 DIAGNOSIS — Z1152 Encounter for screening for COVID-19: Secondary | ICD-10-CM | POA: Diagnosis not present

## 2023-12-20 DIAGNOSIS — A491 Streptococcal infection, unspecified site: Secondary | ICD-10-CM | POA: Diagnosis not present

## 2023-12-20 DIAGNOSIS — J441 Chronic obstructive pulmonary disease with (acute) exacerbation: Secondary | ICD-10-CM | POA: Diagnosis present

## 2023-12-20 DIAGNOSIS — R918 Other nonspecific abnormal finding of lung field: Secondary | ICD-10-CM | POA: Diagnosis not present

## 2023-12-20 DIAGNOSIS — R531 Weakness: Secondary | ICD-10-CM | POA: Diagnosis not present

## 2023-12-20 DIAGNOSIS — J440 Chronic obstructive pulmonary disease with acute lower respiratory infection: Secondary | ICD-10-CM | POA: Diagnosis present

## 2023-12-20 DIAGNOSIS — I2489 Other forms of acute ischemic heart disease: Secondary | ICD-10-CM | POA: Diagnosis present

## 2023-12-20 DIAGNOSIS — A419 Sepsis, unspecified organism: Secondary | ICD-10-CM | POA: Diagnosis present

## 2023-12-20 DIAGNOSIS — H2512 Age-related nuclear cataract, left eye: Secondary | ICD-10-CM | POA: Diagnosis present

## 2023-12-20 DIAGNOSIS — J1100 Influenza due to unidentified influenza virus with unspecified type of pneumonia: Secondary | ICD-10-CM | POA: Diagnosis not present

## 2023-12-20 LAB — BLOOD GAS, VENOUS
Acid-Base Excess: 2 mmol/L (ref 0.0–2.0)
Bicarbonate: 29.4 mmol/L — ABNORMAL HIGH (ref 20.0–28.0)
O2 Saturation: 100 %
Patient temperature: 37
pCO2, Ven: 57 mm[Hg] (ref 44–60)
pH, Ven: 7.32 (ref 7.25–7.43)
pO2, Ven: 164 mm[Hg] — ABNORMAL HIGH (ref 32–45)

## 2023-12-20 LAB — C-REACTIVE PROTEIN: CRP: 14 mg/dL — ABNORMAL HIGH (ref <1.0)

## 2023-12-20 LAB — CREATININE, SERUM
Creatinine, Ser: 0.4 mg/dL — ABNORMAL LOW (ref 0.44–1.00)
GFR, Estimated: >60 mL/min (ref >60)

## 2023-12-20 LAB — COMPREHENSIVE METABOLIC PANEL
ALT: 23 U/L (ref 0–44)
ALT: 23 U/L (ref 0–44)
AST: 29 U/L (ref 15–41)
AST: 36 U/L (ref 15–41)
Albumin: 3.5 g/dL (ref 3.5–5.0)
Albumin: 4.1 g/dL (ref 3.5–5.0)
Alkaline Phosphatase: 76 U/L (ref 38–126)
Alkaline Phosphatase: 97 U/L (ref 38–126)
Anion gap: 12 (ref 5–15)
Anion gap: 13 (ref 5–15)
BUN: 11 mg/dL (ref 8–23)
BUN: 14 mg/dL (ref 8–23)
CO2: 28 mmol/L (ref 22–32)
CO2: 30 mmol/L (ref 22–32)
Calcium: 8.1 mg/dL — ABNORMAL LOW (ref 8.9–10.3)
Calcium: 8.8 mg/dL — ABNORMAL LOW (ref 8.9–10.3)
Chloride: 93 mmol/L — ABNORMAL LOW (ref 98–111)
Chloride: 99 mmol/L (ref 98–111)
Creatinine, Ser: 0.41 mg/dL — ABNORMAL LOW (ref 0.44–1.00)
Creatinine, Ser: 0.53 mg/dL (ref 0.44–1.00)
GFR, Estimated: >60 mL/min (ref >60)
GFR, Estimated: >60 mL/min (ref >60)
Glucose, Bld: 171 mg/dL — ABNORMAL HIGH (ref 70–99)
Glucose, Bld: 273 mg/dL — ABNORMAL HIGH (ref 70–99)
Potassium: 4.2 mmol/L (ref 3.5–5.1)
Potassium: 4.2 mmol/L (ref 3.5–5.1)
Sodium: 136 mmol/L (ref 135–145)
Sodium: 139 mmol/L (ref 135–145)
Total Bilirubin: 0.5 mg/dL (ref 0.0–1.2)
Total Bilirubin: 0.6 mg/dL (ref 0.0–1.2)
Total Protein: 6.9 g/dL (ref 6.5–8.1)
Total Protein: 7.8 g/dL (ref 6.5–8.1)

## 2023-12-20 LAB — RESP PANEL BY RT-PCR (RSV, FLU A&B, COVID)  RVPGX2
Influenza A by PCR: POSITIVE — AB
Influenza B by PCR: NEGATIVE
Resp Syncytial Virus by PCR: NEGATIVE
SARS Coronavirus 2 by RT PCR: NEGATIVE

## 2023-12-20 LAB — TROPONIN I (HIGH SENSITIVITY)
Troponin I (High Sensitivity): 20 ng/L — ABNORMAL HIGH (ref <18)
Troponin I (High Sensitivity): 362 ng/L (ref <18)
Troponin I (High Sensitivity): 474 ng/L
Troponin I (High Sensitivity): 502 ng/L (ref <18)

## 2023-12-20 LAB — LACTIC ACID, PLASMA
Lactic Acid, Venous: 0.6 mmol/L (ref 0.5–1.9)
Lactic Acid, Venous: 0.9 mmol/L (ref 0.5–1.9)
Lactic Acid, Venous: 1.2 mmol/L (ref 0.5–1.9)
Lactic Acid, Venous: 1.3 mmol/L (ref 0.5–1.9)

## 2023-12-20 LAB — STREP PNEUMONIAE URINARY ANTIGEN: Strep Pneumo Urinary Antigen: POSITIVE — AB

## 2023-12-20 LAB — CBC
HCT: 39.3 % (ref 36.0–46.0)
Hemoglobin: 12.5 g/dL (ref 12.0–15.0)
MCH: 27.5 pg (ref 26.0–34.0)
MCHC: 31.8 g/dL (ref 30.0–36.0)
MCV: 86.4 fL (ref 80.0–100.0)
Platelets: 268 10*3/uL (ref 150–400)
RBC: 4.55 MIL/uL (ref 3.87–5.11)
RDW: 14.1 % (ref 11.5–15.5)
WBC: 10.9 10*3/uL — ABNORMAL HIGH (ref 4.0–10.5)
nRBC: 0 % (ref 0.0–0.2)

## 2023-12-20 LAB — HIV ANTIBODY (ROUTINE TESTING W REFLEX): HIV Screen 4th Generation wRfx: NONREACTIVE

## 2023-12-20 LAB — CBG MONITORING, ED
Glucose-Capillary: 162 mg/dL — ABNORMAL HIGH (ref 70–99)
Glucose-Capillary: 138 mg/dL — ABNORMAL HIGH (ref 70–99)
Glucose-Capillary: 125 mg/dL — ABNORMAL HIGH (ref 70–99)

## 2023-12-20 LAB — BRAIN NATRIURETIC PEPTIDE: B Natriuretic Peptide: 49.4 pg/mL (ref 0.0–100.0)

## 2023-12-20 LAB — PROCALCITONIN: Procalcitonin: 1.08 ng/mL

## 2023-12-20 MED ORDER — MORPHINE SULFATE (PF) 2 MG/ML IV SOLN
2.0000 mg | INTRAVENOUS | Status: DC | PRN
  Administered 2023-12-21 – 2023-12-23 (×11): 2 mg via INTRAVENOUS
  Filled 2023-12-20 (×11): qty 1

## 2023-12-20 MED ORDER — MAGNESIUM SULFATE 2 GM/50ML IV SOLN
2.0000 g | Freq: Once | INTRAVENOUS | Status: AC
  Administered 2023-12-20: 2 g via INTRAVENOUS
  Filled 2023-12-20: qty 50

## 2023-12-20 MED ORDER — ARFORMOTEROL TARTRATE 15 MCG/2ML IN NEBU
15.0000 ug | INHALATION_SOLUTION | Freq: Two times a day (BID) | RESPIRATORY_TRACT | Status: DC
  Administered 2023-12-20 – 2023-12-26 (×12): 15 ug via RESPIRATORY_TRACT
  Filled 2023-12-20 (×14): qty 2

## 2023-12-20 MED ORDER — IPRATROPIUM-ALBUTEROL 0.5-2.5 (3) MG/3ML IN SOLN
3.0000 mL | RESPIRATORY_TRACT | Status: DC
  Administered 2023-12-20 – 2023-12-25 (×30): 3 mL via RESPIRATORY_TRACT
  Filled 2023-12-20 (×28): qty 3

## 2023-12-20 MED ORDER — ENOXAPARIN SODIUM 40 MG/0.4ML IJ SOSY
40.0000 mg | PREFILLED_SYRINGE | INTRAMUSCULAR | Status: DC
Start: 2023-12-20 — End: 2023-12-26
  Administered 2023-12-20 – 2023-12-26 (×7): 40 mg via SUBCUTANEOUS
  Filled 2023-12-20 (×7): qty 0.4

## 2023-12-20 MED ORDER — METHYLPREDNISOLONE SODIUM SUCC 40 MG IJ SOLR
40.0000 mg | Freq: Two times a day (BID) | INTRAMUSCULAR | Status: DC
  Administered 2023-12-20 – 2023-12-24 (×8): 40 mg via INTRAVENOUS
  Filled 2023-12-20 (×8): qty 1

## 2023-12-20 MED ORDER — SODIUM CHLORIDE 0.9 % IV SOLN
500.0000 mg | INTRAVENOUS | Status: AC
  Administered 2023-12-21 – 2023-12-23 (×3): 500 mg via INTRAVENOUS
  Filled 2023-12-20 (×3): qty 5

## 2023-12-20 MED ORDER — SODIUM CHLORIDE 0.9 % IV SOLN
2.0000 g | INTRAVENOUS | Status: DC
  Administered 2023-12-21 – 2023-12-24 (×4): 2 g via INTRAVENOUS
  Filled 2023-12-20 (×4): qty 20

## 2023-12-20 MED ORDER — INSULIN ASPART 100 UNIT/ML IJ SOLN
0.0000 [IU] | Freq: Three times a day (TID) | INTRAMUSCULAR | Status: DC
  Administered 2023-12-20 – 2023-12-22 (×3): 1 [IU] via SUBCUTANEOUS
  Administered 2023-12-23: 2 [IU] via SUBCUTANEOUS
  Administered 2023-12-24 – 2023-12-25 (×2): 1 [IU] via SUBCUTANEOUS
  Filled 2023-12-20 (×6): qty 1

## 2023-12-20 MED ORDER — SODIUM CHLORIDE 0.9 % IV BOLUS
1000.0000 mL | Freq: Once | INTRAVENOUS | Status: AC
  Administered 2023-12-20: 1000 mL via INTRAVENOUS

## 2023-12-20 MED ORDER — ACETAMINOPHEN 650 MG RE SUPP: 650.0000 mg | Freq: Four times a day (QID) | RECTAL | Status: DC | PRN

## 2023-12-20 MED ORDER — ONDANSETRON HCL 4 MG/2ML IJ SOLN
4.0000 mg | Freq: Four times a day (QID) | INTRAMUSCULAR | Status: DC | PRN
  Administered 2023-12-20: 4 mg via INTRAVENOUS
  Filled 2023-12-20: qty 2

## 2023-12-20 MED ORDER — METHYLPREDNISOLONE SODIUM SUCC 125 MG IJ SOLR
125.0000 mg | Freq: Once | INTRAMUSCULAR | Status: AC
  Administered 2023-12-20: 125 mg via INTRAVENOUS
  Filled 2023-12-20: qty 2

## 2023-12-20 MED ORDER — ALPRAZOLAM 0.5 MG PO TABS
0.5000 mg | ORAL_TABLET | Freq: Every morning | ORAL | Status: DC
  Administered 2023-12-22 – 2023-12-24 (×3): 0.5 mg via ORAL
  Filled 2023-12-20 (×4): qty 1

## 2023-12-20 MED ORDER — MONTELUKAST SODIUM 10 MG PO TABS
10.0000 mg | ORAL_TABLET | Freq: Every day | ORAL | Status: DC
  Administered 2023-12-22 – 2023-12-26 (×5): 10 mg via ORAL
  Filled 2023-12-20 (×6): qty 1

## 2023-12-20 MED ORDER — FAMOTIDINE 20 MG PO TABS
40.0000 mg | ORAL_TABLET | Freq: Two times a day (BID) | ORAL | Status: DC
  Administered 2023-12-20 – 2023-12-21 (×2): 40 mg via ORAL
  Filled 2023-12-20 (×3): qty 2

## 2023-12-20 MED ORDER — ONDANSETRON HCL 4 MG PO TABS: 4.0000 mg | ORAL_TABLET | Freq: Four times a day (QID) | ORAL | Status: DC | PRN

## 2023-12-20 MED ORDER — SODIUM CHLORIDE 0.9 % IV SOLN: INTRAVENOUS | Status: DC

## 2023-12-20 MED ORDER — SODIUM CHLORIDE 0.9 % IV SOLN
1.0000 g | INTRAVENOUS | Status: DC
  Administered 2023-12-20: 1 g via INTRAVENOUS
  Filled 2023-12-20: qty 10

## 2023-12-20 MED ORDER — SODIUM CHLORIDE 0.9 % IV SOLN
500.0000 mg | INTRAVENOUS | Status: DC
  Administered 2023-12-20: 500 mg via INTRAVENOUS
  Filled 2023-12-20: qty 5

## 2023-12-20 MED ORDER — ALPRAZOLAM 0.25 MG PO TABS
0.2500 mg | ORAL_TABLET | Freq: Once | ORAL | Status: AC
Start: 2023-12-20 — End: 2023-12-20
  Administered 2023-12-20: 0.25 mg via ORAL
  Filled 2023-12-20: qty 1

## 2023-12-20 MED ORDER — FUROSEMIDE 10 MG/ML IJ SOLN
40.0000 mg | Freq: Once | INTRAMUSCULAR | Status: AC
  Administered 2023-12-20: 40 mg via INTRAVENOUS
  Filled 2023-12-20: qty 4

## 2023-12-20 MED ORDER — ASPIRIN 81 MG PO CHEW
81.0000 mg | CHEWABLE_TABLET | Freq: Every day | ORAL | Status: DC
Start: 2023-12-20 — End: 2023-12-26
  Administered 2023-12-22 – 2023-12-25 (×4): 81 mg via ORAL
  Filled 2023-12-20 (×5): qty 1

## 2023-12-20 MED ORDER — PANTOPRAZOLE SODIUM 40 MG PO TBEC
40.0000 mg | DELAYED_RELEASE_TABLET | Freq: Every day | ORAL | Status: DC
Start: 2023-12-20 — End: 2023-12-21
  Administered 2023-12-20: 40 mg via ORAL
  Filled 2023-12-20 (×2): qty 1

## 2023-12-20 MED ORDER — DULOXETINE HCL 30 MG PO CPEP
60.0000 mg | ORAL_CAPSULE | Freq: Every day | ORAL | Status: DC
Start: 2023-12-20 — End: 2023-12-26
  Administered 2023-12-20 – 2023-12-26 (×6): 60 mg via ORAL
  Filled 2023-12-20 (×4): qty 2
  Filled 2023-12-20 (×2): qty 1
  Filled 2023-12-20: qty 2

## 2023-12-20 MED ORDER — ROFLUMILAST 500 MCG PO TABS
250.0000 ug | ORAL_TABLET | Freq: Every day | ORAL | Status: DC
  Administered 2023-12-22: 250 ug via ORAL
  Filled 2023-12-20 (×3): qty 1

## 2023-12-20 MED ORDER — FLUTICASONE PROPIONATE 50 MCG/ACT NA SUSP
2.0000 | Freq: Every day | NASAL | Status: DC
  Administered 2023-12-22 – 2023-12-26 (×5): 2 via NASAL
  Filled 2023-12-20 (×2): qty 16

## 2023-12-20 MED ORDER — ACETAMINOPHEN 325 MG PO TABS
650.0000 mg | ORAL_TABLET | Freq: Four times a day (QID) | ORAL | Status: DC | PRN
Start: 2023-12-20 — End: 2023-12-26
  Administered 2023-12-22: 650 mg via ORAL
  Filled 2023-12-20: qty 2

## 2023-12-20 MED ORDER — OSELTAMIVIR PHOSPHATE 30 MG PO CAPS
30.0000 mg | ORAL_CAPSULE | Freq: Two times a day (BID) | ORAL | Status: AC
  Administered 2023-12-20 – 2023-12-24 (×7): 30 mg via ORAL
  Filled 2023-12-20 (×11): qty 1

## 2023-12-20 MED ORDER — IPRATROPIUM-ALBUTEROL 0.5-2.5 (3) MG/3ML IN SOLN
3.0000 mL | Freq: Four times a day (QID) | RESPIRATORY_TRACT | Status: DC
  Administered 2023-12-20 (×2): 3 mL via RESPIRATORY_TRACT
  Filled 2023-12-20 (×2): qty 3

## 2023-12-20 MED ORDER — SODIUM CHLORIDE 0.9% FLUSH
3.0000 mL | Freq: Two times a day (BID) | INTRAVENOUS | Status: DC
  Administered 2023-12-20 – 2023-12-26 (×12): 3 mL via INTRAVENOUS

## 2023-12-20 MED ORDER — ALPRAZOLAM 0.25 MG PO TABS
0.2500 mg | ORAL_TABLET | Freq: Three times a day (TID) | ORAL | Status: DC | PRN
  Administered 2023-12-20: 0.25 mg via ORAL
  Filled 2023-12-20 (×2): qty 1

## 2023-12-20 MED ORDER — ALPRAZOLAM 0.25 MG PO TABS
0.2500 mg | ORAL_TABLET | Freq: Every day | ORAL | Status: DC
Start: 2023-12-20 — End: 2023-12-26
  Administered 2023-12-21 – 2023-12-25 (×5): 0.25 mg via ORAL
  Filled 2023-12-20 (×5): qty 1

## 2023-12-20 MED ORDER — FUROSEMIDE 40 MG PO TABS: 40.0000 mg | ORAL_TABLET | Freq: Every day | ORAL | Status: DC

## 2023-12-20 MED ORDER — ALBUTEROL SULFATE (2.5 MG/3ML) 0.083% IN NEBU
2.5000 mg | INHALATION_SOLUTION | RESPIRATORY_TRACT | Status: DC | PRN
  Administered 2023-12-22: 2.5 mg via RESPIRATORY_TRACT
  Filled 2023-12-20: qty 3

## 2023-12-20 NOTE — ED Notes (Signed)
Patient placed on bedpan. Pt's primary RN notified of placement.

## 2023-12-20 NOTE — ED Notes (Signed)
Pt started to relax more with her second dose of xanax.  Her oxygen did start to desat at 4L on [place].  Pt started to show a 88% on 4L and cont to show labored breathing.  Bi-pap was placed back on pt, and her duo-neb given via bipap.  Pt's oxygen level cont to stay at 88% even on bi-pap so RT was called to adjust bi-pap settings.

## 2023-12-20 NOTE — ED Notes (Signed)
FLOAT NURSE

## 2023-12-20 NOTE — ED Provider Notes (Signed)
[facility] Provider Note    Event Date/Time   First MD Initiated Contact with Patient 12/20/23 0025     (approximate)   History   Respiratory Distress (Called out for respiratory distress. Wears 4 L at baseline. Was receiving  2 Duonebs on scene. EMS gave 125mg  solumedrol, 2 G magnesium 1 duoneb. Was in route with EMS and became unresponsive. Placed on NRB)   HPI  Rachael Blankenship is a 68 y.o. female   Past medical history of COPD on chronic 4 L nasal cannula who presents to the emergency department in respiratory distress.  Over the last several days has had a mild cough.  Her breathing got a lot worse today.  She took a nebulizer treatment with transient mild relief.  She called EMS.  She has no chest pain.  EMS noted that she was struggling to breathe, gave her magnesium, Solu-Medrol, nebulizers.  She arrived on nonrebreather.  She became more lethargic en route.  Independent Historian contributed to assessment above: Her husband at bedside to corroborate information above.  EMS gives information as above    Physical Exam   Triage Vital Signs: ED Triage Vitals  Encounter Vitals Group     BP 12/19/23 2331 (!) 186/111     Systolic BP Percentile --      Diastolic BP Percentile --      Pulse Rate 12/19/23 2331 (!) 126     Resp 12/19/23 2331 (!) 25     Temp 12/19/23 2341 99 F (37.2 C)     Temp Source 12/19/23 2331 Oral     SpO2 12/19/23 2331 100 %     Weight --      Height --      Head Circumference --      Peak Flow --      Pain Score 12/19/23 2330 7     Pain Loc --      Pain Education --      Exclude from Growth Chart --     Most recent vital signs: Vitals:   12/19/23 2341 12/20/23 0030  BP:  (!) 140/70  Pulse:  (!) 125  Resp:  (!) 30  Temp: 99 F (37.2 C)   SpO2:  93%    General: Awake, no distress.  CV:  Good peripheral perfusion.  Resp:  Normal effort.  Abd:  No distention.  Other:  Initially somnolent, increased work of  breathing, decreased breath sounds bilaterally with wheezing scant throughout.  She was opening her eyes and scanning the room and tracking me but was unable to speak and give me information initially.  After being on BiPAP for about 10 minutes she looked much Blankenship, mentation was Blankenship she was able to speak in full sentences and answer all questions appropriately.   ED Results / Procedures / Treatments   Labs (all labs ordered are listed, but only abnormal results are displayed) Labs Reviewed  RESP PANEL BY RT-PCR (RSV, FLU A&B, COVID)  RVPGX2 - Abnormal; Notable for the following components:      Result Value   Influenza A by PCR POSITIVE (*)    All other components within normal limits  CBC WITH DIFFERENTIAL/PLATELET - Abnormal; Notable for the following components:   WBC 16.0 (*)    Neutro Abs 12.2 (*)    Monocytes Absolute 1.4 (*)    All other components within normal limits  COMPREHENSIVE METABOLIC PANEL - Abnormal; Notable for the following components:   Chloride  93 (*)    Glucose, Bld 273 (*)    Calcium 8.8 (*)    All other components within normal limits  TROPONIN I (HIGH SENSITIVITY) - Abnormal; Notable for the following components:   Troponin I (High Sensitivity) 20 (*)    All other components within normal limits  CULTURE, BLOOD (ROUTINE X 2)  CULTURE, BLOOD (ROUTINE X 2)  BRAIN NATRIURETIC PEPTIDE  LACTIC ACID, PLASMA  BLOOD GAS, VENOUS  LACTIC ACID, PLASMA  TROPONIN I (HIGH SENSITIVITY)     I ordered and reviewed the above labs they are notable for influenza A is positive.  EKG  ED ECG REPORT I, Pilar Jarvis, the attending physician, personally viewed and interpreted this ECG.   Date: 12/20/2023  EKG Time: 2340  Rate: 133  Rhythm: sinus tachycardia  Axis: nl  Intervals:none  ST&T Change: no stemi    RADIOLOGY I independently reviewed and interpreted no obvious focality or pneumothorax. I also reviewed radiologist's formal read.    PROCEDURES:  Critical Care performed: Yes, see critical care procedure note(s)  .Critical Care  Performed by: Pilar Jarvis, MD Authorized by: Pilar Jarvis, MD   Critical care provider statement:    Critical care time (minutes):  30   Critical care was time spent personally by me on the following activities:  Development of treatment plan with patient or surrogate, discussions with consultants, evaluation of patient's response to treatment, examination of patient, ordering and review of laboratory studies, ordering and review of radiographic studies, ordering and performing treatments and interventions, pulse oximetry, re-evaluation of patient's condition and review of old charts    MEDICATIONS ORDERED IN ED: Medications  albuterol (PROVENTIL) (2.5 MG/3ML) 0.083% nebulizer solution (has no administration in time range)  sodium chloride 0.9 % bolus 1,000 mL (1,000 mLs Intravenous Bolus from Bag 12/19/23 2339)    External physician / consultants:  I spoke with hospital medicine for admission and regarding care plan for this patient.   IMPRESSION / MDM / ASSESSMENT AND PLAN / ED COURSE  I reviewed the triage vital signs and the nursing notes.                                Patient's presentation is most consistent with acute presentation with potential threat to life or bodily function.  Differential diagnosis includes, but is not limited to, respiratory infection, sepsis, COPD exacerbation, acute respiratory failure   The patient is on the cardiac monitor to evaluate for evidence of arrhythmia and/or significant heart rate changes.  MDM:    Is a patient with COPD exacerbation that responded very well to BiPAP and was somnolent the mental status rapidly improving.  Given nebulizer treatments as well as Solu-Medrol already by EMS.  She meets sepsis criteria with tachycardia, tachypnea, and is influenza positive.  Will defer antibiotics as no focal opacities noted on x-ray and viral  illness suspected and detected.  She got 30 cc/kg ideal body weight fluid bolus.  She is admitted to hospital service.       FINAL CLINICAL IMPRESSION(S) / ED DIAGNOSES   Final diagnoses:  Acute respiratory failure, unspecified whether with hypoxia or hypercapnia (HCC)  Respiratory distress  COPD exacerbation (HCC)  Influenza A     Rx / DC Orders   ED Discharge Orders     None        Note:  This document was prepared using Dragon voice recognition software and may  include unintentional dictation errors.    Pilar Jarvis, MD 12/20/23 [phone]

## 2023-12-20 NOTE — ED Notes (Signed)
Pt is anxious and moving around in her bed--she stated, once she came off of bi-pap and she could have her po xanax, that she normally takes 0.5mg  of xanax daily.  Her ordered dose of xanax 0.25mg  was given but didn't help pt.  Revonda Standard, NP was messaged about pt's normal dose, and pt's behavior and another 0.25mg  was ordered and given per order.   Pt's husband is in the room as well.   D/t pt not sitting still, her pump kept beeping, which was giving her NS. Pump was stopped because the constant beeping made pt more anxious.  Will restart once pt seems calm enough to receive.

## 2023-12-20 NOTE — H&P (Signed)
History and Physical    Patient: Rachael Blankenship:[id number] DOB: 1956-03-07 DOA: 12/19/2023 DOS: the patient was seen and examined on 12/20/2023 PCP: Lyndon Code, MD  Patient coming from: Home Medical readiness/disposition: Anticipate patient will be ready for discharge home by 12/25/2023.  She will discharge back home with husband.  Chief Complaint:  Chief Complaint  Patient presents with   Respiratory Distress    Called out for respiratory distress. Wears 4 L at baseline. Was receiving  2 Duonebs on scene. EMS gave 125mg  solumedrol, 2 G magnesium 1 duoneb. Was in route with EMS and became unresponsive. Placed on NRB   HPI: Rachael Blankenship is a 68 y.o. female with medical history significant of COPD on chronic O2 4 L/min and also utilizes nocturnal BiPAP for chronic hypercarbia, general anxiety disorder, GERD, history of tobacco abuse, dyslipidemia.  She was brought in by EMS after developing worsening respiratory symptoms.  Over the previous several days she had had a mild cough.  She also had fevers with a Tmax of 101.1 degrees.  She took a nebulizer treatment with minimal improvement in her symptoms.  EMS noted patient with significant respiratory distress, she was given magnesium, Solu-Medrol and additional nebulizers.  Her doctor had recently prescribed her a Z-Pak for her cough and she has only taken 2 pills.  She arrived to the ED on 100% nonrebreather.  She became more lethargic and route to the hospital.  Subsequent testing revealed that the patient was positive for influenza A.  She was hyperglycemic.  She had mild elevation in her troponin initially at 20.  White count was elevated at 16,000.  Lactic acid was normal and chest x-ray was without any evidence of acute disease.  She has subsequently been transitioned to a BiPAP which she also utilizes at night.  She is having some difficulty with appropriate mask fit and her husband will be leaving shortly to go home and obtain her home  BiPAP mask.  The hospitalist service has been asked to evaluate the patient for admission.  Since admission an additional troponin was obtained and it was elevated at 362.  Patient has not had any chest pain prior to onset of current respiratory symptoms or during current respiratory symptoms.  Her initial EKG did not show any ischemic changes and given the elevation in troponin and repeat EKG has been ordered.  Given her underlying severe lung disease I have also ordered IV Rocephin and Zithromax.  I have ordered CRP, urinary strep, and procalcitonin to better clarify pulmonary infection in the event she has a superimposed bacterial pneumonia.  Because she is on BiPAP and is essentially n.p.o. and appears to be dehydrated based on clinical exam I have ordered maintenance IV fluids at 75 cc/h.  She is also somewhat hyperglycemic without history of diabetes.  Given the fact she will be on scheduled steroids I have written for CBGs to be checked every 6 hours with very sensitive insulin coverage.  Patient recently had cataract surgery this past Thursday and has been prescribed eyedrops.  The husband is returning to home to obtain these as well.  Review of Systems: As mentioned in the history of present illness. All other systems reviewed and are negative.   Past Medical History:  Diagnosis Date   Chronic respiratory failure with hypoxia (HCC)    Cigarette smoker    COPD (chronic obstructive pulmonary disease) (HCC)    Dependence on supplemental oxygen    Depression with anxiety  Endometriosis    GERD (gastroesophageal reflux disease)    Grade I diastolic dysfunction    Hyperlipidemia    Kidney stones    Reactive airway disease 03/08/2014   Past Surgical History:  Procedure Laterality Date   CATARACT EXTRACTION W/PHACO Right 12/04/2023   Procedure: CATARACT EXTRACTION PHACO AND INTRAOCULAR LENS PLACEMENT (IOC) RIGHT  CLAREON VIVITY LENS;  Surgeon: Estanislado Pandy, MD;  Location: [facility];  Service: Ophthalmology;  Laterality: Right;  10.40 1:04.0   CATARACT EXTRACTION W/PHACO Left 12/18/2023   Procedure: CATARACT EXTRACTION PHACO AND INTRAOCULAR LENS PLACEMENT (IOC) LEFT  CLAREON VIVITY TORIC 16.28, 01:19.2;  Surgeon: Estanislado Pandy, MD;  Location: [facility];  Service: Ophthalmology;  Laterality: Left;   COLONOSCOPY WITH PROPOFOL N/A 01/02/2023   Procedure: COLONOSCOPY WITH PROPOFOL;  Surgeon: Toney Reil, MD;  Location[facility] ENDOSCOPY;  Service: Gastroenterology;  Laterality: N/A;   ESOPHAGOGASTRODUODENOSCOPY N/A 07/29/2018   Procedure: ESOPHAGOGASTRODUODENOSCOPY (EGD);  Surgeon: Toledo, Boykin Nearing, MD;  Location: [facility] ENDOSCOPY;  Service: Gastroenterology;  Laterality: N/A;   ESOPHAGOGASTRODUODENOSCOPY (EGD) WITH PROPOFOL N/A 01/02/2023   Procedure: ESOPHAGOGASTRODUODENOSCOPY (EGD) WITH PROPOFOL;  Surgeon: Toney Reil, MD;  Location: [facility] ENDOSCOPY;  Service: Gastroenterology;  Laterality: N/A;   KIDNEY STONE SURGERY     TONSILLECTOMY     Social History:  reports that she has been smoking cigarettes. She started smoking about 50 years ago. She has a 26.8 pack-year smoking history. She has never used smokeless tobacco. She reports current alcohol use. She reports that she does not use drugs.  Allergies  Allergen Reactions   Naproxen Itching   Sulfa Antibiotics Hives and Rash    Family History  Problem Relation Age of Onset   Hypertension Mother    Hyperlipidemia Mother    GER disease Mother    Heart attack Father        23 yrs ago   Hypertension Father    Parkinson's disease Father    GER disease Father     Prior to Admission medications   Medication Sig Start Date End Date Taking? Authorizing Provider  ALPRAZolam (XANAX) 0.25 MG tablet Take 1 tablet (0.25 mg total) by mouth 3 (three) times daily as needed for anxiety. 12/11/23   Sallyanne Kuster, NP  ascorbic acid (VITAMIN C) 500 MG tablet Take 500 mg by mouth daily.     [provider]  aspirin 81 MG tablet Take 81 mg by mouth at bedtime.     [provider]  azithromycin (ZITHROMAX) 250 MG tablet Use as directed for 5 days. 12/19/23   McDonough, Salomon Fick, PA-C  Cholecalciferol 25 MCG (1000 UT) tablet Take 1,000 Units by mouth daily.     [provider]  Coenzyme Q10 (CO Q-10) 100 MG CAPS Take 200 mg by mouth daily.     [provider]  DENTA 5000 PLUS 1.1 % CREA dental cream Take 1 application by mouth daily. 04/14/21   [provider]  DULERA 200-5 MCG/ACT AERO INHALE 1 PUFF INTO THE LUNGS TWICE DAILY 05/23/23   Sallyanne Kuster, NP  DULoxetine (CYMBALTA) 60 MG capsule Take 1 capsule (60 mg total) by mouth daily. 11/02/23   Lyndon Code, MD  famotidine (PEPCID) 40 MG tablet Take 1 tablet (40 mg total) by mouth 2 (two) times daily. 07/02/23   Sallyanne Kuster, NP  furosemide (LASIX) 40 MG tablet TAKE 1 TABLET BY MOUTH DAILY 02/24/23   Sallyanne Kuster, NP  gemfibrozil (LOPID) 600 MG tablet  Take 1 tablet (600 mg total) by mouth 2 (two) times daily before a meal. 12/11/23   Abernathy, Alyssa, NP  Ipratropium-Albuterol (COMBIVENT RESPIMAT) 20-100 MCG/ACT AERS respimat INHALE 1 PUFF INTO THE LUNGS EVERY 6 HOURS AS NEEDED FOR WHEEZING OR SHORTNESS OF BREATH 10/31/23   Lyndon Code, MD  ipratropium-albuterol (DUONEB) 0.5-2.5 (3) MG/3ML SOLN Use 1 vial via nebulizer every 4 t0 6 hrs 09/13/21   Sallyanne Kuster, NP  Magnesium 400 MG TABS Take 400 mg by mouth daily.     [provider]  mometasone (NASONEX) 50 MCG/ACT nasal spray Place 2 sprays into the nose daily.    [provider]  montelukast (SINGULAIR) 10 MG tablet Take 1 tablet (10 mg total) by mouth daily. 01/31/23   Sallyanne Kuster, NP  Multiple Vitamins-Minerals (MULTIVITAMIN WITH MINERALS) tablet Take 1 tablet by mouth daily.    [provider]  omeprazole (PRILOSEC) 40 MG capsule Take 1 capsule (40 mg total) by mouth 2 (two) times daily. 12/04/23    Sallyanne Kuster, NP  OXYGEN Inhale 4 L into the lungs continuous. Continuous oxygen and NIV at night, 2 liters   pt uses [facility] for oxygen supplies    [provider]  potassium chloride (KLOR-CON) 8 MEQ tablet Take 1 capsule (8 mEq total) by mouth daily. 10/20/23   Sallyanne Kuster, NP  Potassium Chloride CR (MICRO-K) 8 MEQ CPCR capsule CR Take 1 capsule (8 mEq total) by mouth daily. 10/26/23   Lyndon Code, MD  revefenacin [facility]) 175 MCG/3ML nebulizer solution USE 1 VIAL IN NEBULIZER DAILY 08/11/23   Sallyanne Kuster, NP  Roflumilast 250 MCG TABS TAKE 1 TABLET BY MOUTH EVERY DAY 11/27/23   Abernathy, Arlyss Repress, NP  simvastatin (ZOCOR) 20 MG tablet TAKE 1 TABLET(20 MG) BY MOUTH EVERY EVENING FOR CHOLESTEROL 11/06/23   Sallyanne Kuster, NP  SPIRIVA HANDIHALER 18 MCG inhalation capsule Place 1 capsule (18 mcg total) into inhaler and inhale daily. i 12/09/23   Sallyanne Kuster, NP  sucralfate (CARAFATE) 1 g tablet TAKE 1 TABLET BY MOUTH FOUR TIMES DAILY AT BEDTIME WITH MEALS 06/19/23   Sallyanne Kuster, NP  Varenicline Tartrate, Starter, 0.5 MG X 11 & 1 MG X 42 TBPK Take 0.5 mg by mouth once daily on days 1-3, then take 0.5 mg twice daily on days 4-7, then increase to 1 mg twice daily 08/13/23   Sallyanne Kuster, NP  vitamin E 180 MG (400 UNITS) capsule Take by mouth.    [provider]    Physical Exam: Vitals:   12/20/23 0852 12/20/23 0900 12/20/23 1000 12/20/23 1100  BP:  (!) 142/73 (!) 157/78 (!) 140/77  Pulse:  98 (!) 102 95  Resp: (!) 26 (!) 26 (!) 29 (!) 25  Temp:      TempSrc:      SpO2: 98% 98% 97% 95%   Constitutional: NAD, calm, comfortable Eyes: PERRL, lids and conjunctivae normal ENMT: Mucous membranes are dry. Posterior pharynx clear of any exudate or lesions.Normal dentition.  Neck: normal, supple, no masses, no thyromegaly-exam limited by placement of BiPAP mask Respiratory: Diffuse coarse wheezing throughout all lung fields both anterior and posteriorly.   Patient with increased work of breathing noting she is sitting up in bed and at times tripoding.  Her BiPAP mask is ill fitting with air leaks.  No accessory muscle use noted despite increased work of breathing.  O2 bleed into BiPAP mask is 40%. Cardiovascular: Regular minimally tachycardic rate and rhythm, no murmurs / rubs /  gallops. No extremity edema. 2+ pedal pulses. .  Abdomen: no tenderness, no masses palpated. No obvious hepatosplenomegaly. Bowel sounds positive.  Musculoskeletal: no clubbing / cyanosis. No joint deformity upper and lower extremities. Good ROM, no contractures. Normal muscle tone.  Skin: no rashes, lesions, ulcers. No induration Neurologic: CN 2-12 grossly intact. Sensation intact, DTR normal. Strength 5/5 x all 4 extremities.  Psychiatric: Normal judgment and insight. Alert and oriented x 3. Normal mood.     Data Reviewed:  Initial sodium 136, potassium 4.2, chloride 93, CO2 30, glucose 273, BUN 11, creatinine 0.53, LFTs are normal-repeat electrolyte panel is pending  BNP 49.4 Troponin 20 with follow-up 362  Lactic acid 0.9 and 1.3  WBC 16,000 with normal differential, hemoglobin 13.5, platelets 374,000 Follow-up CBC white count 10,900, hemoglobin 12.5, platelets 268,000  Viral PCR positive for influenza A otherwise negative  Blood cultures obtained and pending  Chest x-ray and EKG as documented in HPI    Assessment and Plan: O2 dependent COPD with utilization of nocturnal BiPAP (chronic hypoxemic and hypercarbic COPD) Acute hypoxemic respiratory failure secondary to influenza A Currently patient is requiring round-the-clock BiPAP Diffuse wheezing so we will add scheduled steroids and DuoNebs with albuterol nebs every 2 hours as needed Given underlying severe lung disease and acute influenza as a precaution we will add empiric IV Rocephin and Zithromax.   Will check procalcitonin, urinary strep and CRP -follow-up on blood cultures Continues to have  diffuse wheezing and increased work of breathing but hopeful with more aggressive utilization of steroids and nebulizers this will improve.  Patient had been given steroids nebulizers and magnesium en route.  Will give an additional dose of magnesium IV as well Underlying dehydration from influenza and fever.  N.p.o. due to BiPAP but will allow sips and chips.  Because of this though she will need continuous IV fluids until able to wean from BiPAP and only use at at bedtime Patient has never required intubation in the past No signs or symptoms of sepsis physiology  Elevated troponins Initial EKG and other symptoms are reassuring (No CP, etc) Repeat EKG obtained after troponin greater than 300 without any acute ischemic changes Continue to trend troponin Likely due to demand ischemia from hypoxemia If continues to trend upward may require cardiology consultation and echocardiogram  Acute hyperglycemia Does not have any history of diabetes and suspect elevated glucose secondary to elevated cortisol due to acute illness Obtain hemoglobin A1c Will check CBGs every 6 hours and provide very sensitive SSI if indicated  Recent cataract surgery Husband will be obtaining recently prescribed eyedrops and will bring to the hospital    Advance Care Planning:   Code Status: Full Code   VTE prophylaxis: Lovenox  Consults: None  Family Communication: Husband and daughter at the bedside  Severity of Illness: The appropriate patient status for this patient is INPATIENT. Inpatient status is judged to be reasonable and necessary in order to provide the required intensity of service to ensure the patient's safety. The patient's presenting symptoms, physical exam findings, and initial radiographic and laboratory data in the context of their chronic comorbidities is felt to place them at high risk for further clinical deterioration. Furthermore, it is not anticipated that the patient will be medically stable  for discharge from the hospital within 2 midnights of admission.   * I certify that at the point of admission it is my clinical judgment that the patient will require inpatient hospital care spanning beyond 2 midnights from the  point of admission due to high intensity of service, high risk for further deterioration and high frequency of surveillance required.*  Author: Junious Silk, NP 12/20/2023 11:40 AM  For on call review [email or web address].

## 2023-12-20 NOTE — ED Notes (Signed)
Pt called out c/o difficulty breathing. Pt attempted 4L [place] per pt request. Unable to tolerate, pt placed back on bipap. Pt HOB elevated, family at bedside. Primary RN made aware.

## 2023-12-20 NOTE — Progress Notes (Signed)
Trial off Of bipap, placed on 4l . Pt wears 4l @ home. RN aware.

## 2023-12-21 ENCOUNTER — Inpatient Hospital Stay
Admit: 2023-12-21 | Discharge: 2023-12-21 | Disposition: A | Discharge status: Home / Self Care | Payer: Medicare Other | Attending: Pulmonary Disease | Admitting: Pulmonary Disease

## 2023-12-21 ENCOUNTER — Telehealth: Payer: Self-pay | Admitting: Emergency Medicine

## 2023-12-21 DIAGNOSIS — A419 Sepsis, unspecified organism: Secondary | ICD-10-CM

## 2023-12-21 DIAGNOSIS — J9622 Acute and chronic respiratory failure with hypercapnia: Secondary | ICD-10-CM | POA: Diagnosis not present

## 2023-12-21 DIAGNOSIS — J9601 Acute respiratory failure with hypoxia: Secondary | ICD-10-CM | POA: Diagnosis present

## 2023-12-21 DIAGNOSIS — J9621 Acute and chronic respiratory failure with hypoxia: Secondary | ICD-10-CM

## 2023-12-21 DIAGNOSIS — J09X1 Influenza due to identified novel influenza A virus with pneumonia: Secondary | ICD-10-CM | POA: Diagnosis not present

## 2023-12-21 DIAGNOSIS — J441 Chronic obstructive pulmonary disease with (acute) exacerbation: Secondary | ICD-10-CM | POA: Diagnosis not present

## 2023-12-21 LAB — COMPREHENSIVE METABOLIC PANEL
ALT: 35 U/L (ref 0–44)
AST: 49 U/L — ABNORMAL HIGH (ref 15–41)
Albumin: 3.1 g/dL — ABNORMAL LOW (ref 3.5–5.0)
Alkaline Phosphatase: 75 U/L (ref 38–126)
Anion gap: 10 (ref 5–15)
BUN: 22 mg/dL (ref 8–23)
CO2: 31 mmol/L (ref 22–32)
Calcium: 7.9 mg/dL — ABNORMAL LOW (ref 8.9–10.3)
Chloride: 98 mmol/L (ref 98–111)
Creatinine, Ser: 0.4 mg/dL — ABNORMAL LOW (ref 0.44–1.00)
GFR, Estimated: >60 mL/min (ref >60)
Glucose, Bld: 122 mg/dL — ABNORMAL HIGH (ref 70–99)
Potassium: 4 mmol/L (ref 3.5–5.1)
Sodium: 139 mmol/L (ref 135–145)
Total Bilirubin: 0.6 mg/dL (ref 0.0–1.2)
Total Protein: 6.5 g/dL (ref 6.5–8.1)

## 2023-12-21 LAB — CBC
HCT: 35.4 % — ABNORMAL LOW (ref 36.0–46.0)
Hemoglobin: 11.3 g/dL — ABNORMAL LOW (ref 12.0–15.0)
MCH: 27.6 pg (ref 26.0–34.0)
MCHC: 31.9 g/dL (ref 30.0–36.0)
MCV: 86.3 fL (ref 80.0–100.0)
Platelets: 260 10*3/uL (ref 150–400)
RBC: 4.1 MIL/uL (ref 3.87–5.11)
RDW: 14.6 % (ref 11.5–15.5)
WBC: 10.1 10*3/uL (ref 4.0–10.5)
nRBC: 0 % (ref 0.0–0.2)

## 2023-12-21 LAB — GLUCOSE, CAPILLARY
Glucose-Capillary: 171 mg/dL — ABNORMAL HIGH (ref 70–99)
Glucose-Capillary: 172 mg/dL — ABNORMAL HIGH (ref 70–99)
Glucose-Capillary: 122 mg/dL — ABNORMAL HIGH (ref 70–99)
Glucose-Capillary: 134 mg/dL — ABNORMAL HIGH (ref 70–99)

## 2023-12-21 LAB — CBG MONITORING, ED
Glucose-Capillary: 115 mg/dL — ABNORMAL HIGH (ref 70–99)
Glucose-Capillary: 120 mg/dL — ABNORMAL HIGH (ref 70–99)
Glucose-Capillary: 126 mg/dL — ABNORMAL HIGH (ref 70–99)

## 2023-12-21 LAB — MRSA NEXT GEN BY PCR, NASAL: MRSA by PCR Next Gen: NOT DETECTED

## 2023-12-21 LAB — TROPONIN I (HIGH SENSITIVITY): Troponin I (High Sensitivity): 226 ng/L (ref <18)

## 2023-12-21 MED ORDER — FUROSEMIDE 10 MG/ML IJ SOLN
40.0000 mg | Freq: Once | INTRAMUSCULAR | Status: AC
  Administered 2023-12-21: 40 mg via INTRAVENOUS
  Filled 2023-12-21: qty 4

## 2023-12-21 MED ORDER — ENSURE ENLIVE PO LIQD
237.0000 mL | Freq: Two times a day (BID) | ORAL | Status: DC
  Administered 2023-12-22 – 2023-12-26 (×6): 237 mL via ORAL

## 2023-12-21 MED ORDER — CHLORHEXIDINE GLUCONATE CLOTH 2 % EX PADS
6.0000 | MEDICATED_PAD | Freq: Every day | CUTANEOUS | Status: DC
  Administered 2023-12-21 – 2023-12-24 (×3): 6 via TOPICAL

## 2023-12-21 MED ORDER — PANTOPRAZOLE SODIUM 40 MG IV SOLR
40.0000 mg | INTRAVENOUS | Status: DC
  Administered 2023-12-21 – 2023-12-24 (×4): 40 mg via INTRAVENOUS
  Filled 2023-12-21 (×4): qty 10

## 2023-12-21 MED ORDER — PREDNISOL ACE-MOXIFLOX-BROMFEN 1-0.5-0.075 % OP SUSP
1.0000 [drp] | Freq: Four times a day (QID) | OPHTHALMIC | Status: DC
  Administered 2023-12-21: 1 [drp] via OPHTHALMIC
  Filled 2023-12-21: qty 1

## 2023-12-21 MED ORDER — FAMOTIDINE IN NACL 20-0.9 MG/50ML-% IV SOLN
20.0000 mg | Freq: Two times a day (BID) | INTRAVENOUS | Status: DC
  Administered 2023-12-21 – 2023-12-22 (×3): 20 mg via INTRAVENOUS
  Filled 2023-12-21 (×3): qty 50

## 2023-12-21 MED ORDER — PREDNISOL ACE-MOXIFLOX-BROMFEN 1-0.5-0.075 % OP SUSP
1.0000 [drp] | Freq: Two times a day (BID) | OPHTHALMIC | Status: DC
  Administered 2023-12-21 – 2023-12-26 (×10): 1 [drp] via OPHTHALMIC

## 2023-12-21 MED ORDER — NON FORMULARY: 1.0000 [drp] | Freq: Two times a day (BID) | Status: DC

## 2023-12-21 MED ORDER — PREDNISOL ACE-MOXIFLOX-BROMFEN 1-0.5-0.075 % OP SUSP
1.0000 [drp] | Freq: Four times a day (QID) | OPHTHALMIC | Status: DC
  Administered 2023-12-21 – 2023-12-26 (×19): 1 [drp] via OPHTHALMIC

## 2023-12-21 NOTE — Telephone Encounter (Signed)
This RN witnessed Zach, Therapist, art 1 Alprazolam 0.5 mg tablet in the stericycle bin. Pt had already been moved off the floor and it was not able to returned to the pyxis.

## 2023-12-21 NOTE — ED Notes (Signed)
Pt husband giving pt eye drops for cataract surgery.

## 2023-12-21 NOTE — ED Notes (Signed)
Advised nurse that patient has ready bed

## 2023-12-21 NOTE — ED Notes (Signed)
Pt rial off BiPap, pt placed on 4L. Pt O2 sats in mid 90's.

## 2023-12-21 NOTE — Consult Note (Signed)
NAME:  Rachael Blankenship, MRN:  [id number], DOB:  Oct 22, 1956, LOS: 1 ADMISSION DATE:  12/19/2023, CONSULTATION DATE:  12/21/2023 REFERRING MD:  Dr. Georgeann Oppenheim, CHIEF COMPLAINT:  Acute on Chronic Hypoxic Respiratory Failure   Brief Pt Description / Synopsis:  68 y.o. female admitted with Acute on Chronic Hypoxic & Hypercapnic Respiratory Failure in the setting of Acute COPD Exacerbation, Influenza A Infection, and superimposed Strep Pneumoniae Pneumonia requiring BiPAP.  High risk for intubation.  History of Present Illness:  Rachael Blankenship is a 68 y.o. female with a past medical history of COPD and chronic hypoxic respiratory failure on 4 L baseline who presented to the ED with chief complaint of severe respiratory distress with associated fever and cough.  Pt currently with respiratory distress on BiPAP, therefore history is obtained from chart review.  Per H&P from Hospitalists, she had progressive cough and shortness of breath over several days.  She had minimal relief with nebulizer treatments and Azithromycin at home.  Upon EMS arrival she noted to have severe respiratory distress of which she was placed on non-rebreather mask and  given magnesium, solumedrol and nebulizers.  In group she became transiently unresponsive and required placement of BiPAP on upon arrival.  ED Course: Initial Vital Signs: Temperature 99 Fahrenheit orally, pulse 126, respiratory rate 25, blood pressure 186/111, SpO2 100% on nonrebreather Significant Labs: Glucose 273, BNP 49, high-sensitivity troponin peaked at 582, lactic acid 0.9, WBC 16.0, procalcitonin 1.08 Positive for influenza A Strep pneumo urinary antigen positive Imaging Chest X-ray>>IMPRESSION: 1. No active disease. 2. Aortic Atherosclerosis    She was admitted by the hospitalist for further workup and treatment.  Please see significant hospital events section below for full detailed hospital course.   Pertinent  Medical History   Past Medical  History:  Diagnosis Date   Chronic respiratory failure with hypoxia (HCC)    Cigarette smoker    COPD (chronic obstructive pulmonary disease) (HCC)    Dependence on supplemental oxygen    Depression with anxiety    Endometriosis    GERD (gastroesophageal reflux disease)    Grade I diastolic dysfunction    Hyperlipidemia    Kidney stones    Reactive airway disease 03/08/2014    Micro Data:  1/24: COVID/RSV/FLU PCR>>  + Influenza A 1/24: Blood culture x2>> no growth to date 1/25: Strep Pneumo urinary antigen + 1/25: HIV Screen>> nonreactive  Antimicrobials:   Anti-infectives (From admission, onward)    Start     Dose/Rate Route Frequency Ordered Stop   12/21/23 1000  azithromycin (ZITHROMAX) 500 mg in sodium chloride 0.9 % 250 mL IVPB        500 mg 250 mL/hr over 60 Minutes Intravenous Every 24 hours 12/20/23 1431 12/24/23 0959   12/21/23 0900  cefTRIAXone (ROCEPHIN) 2 g in sodium chloride 0.9 % 100 mL IVPB        2 g 200 mL/hr over 30 Minutes Intravenous Every 24 hours 12/20/23 1431 12/26/23 0859   12/20/23 1000  oseltamivir (TAMIFLU) capsule 30 mg        30 mg Oral 2 times daily 12/20/23 0911 12/25/23 0959   12/20/23 0900  cefTRIAXone (ROCEPHIN) 1 g in sodium chloride 0.9 % 100 mL IVPB  Status:  Discontinued        1 g 200 mL/hr over 30 Minutes Intravenous Every 24 hours 12/20/23 0748 12/20/23 1431   12/20/23 0900  azithromycin (ZITHROMAX) 500 mg in sodium chloride 0.9 % 250 mL IVPB  Status:  Discontinued  500 mg 250 mL/hr over 60 Minutes Intravenous Every 24 hours 12/20/23 0748 12/20/23 1431       Significant Hospital Events: Including procedures, antibiotic start and stop dates in addition to other pertinent events   1/25: Admitted by [facility] with COPD Exacerbation, Influenza infection, and Strep Pneumo pneumonia.  Requiring BiPAP 1/26: Remains BiPAP dependent with increased WOB.  High risk for intubation.  PCCM consulted.  Interim History / Subjective:  -Earlier  this morning was trialed off BiPAP, however developed increased work of breathing with respiratory rate in the 30s -Placed on BiPAP again and given IV Lasix and morphine -On examination the ED after Lasix and morphine, she reports her shortness of breath has improved, respiratory rate now in the upper 20s and able to speak in short sentences -Hemodynamically stable  Objective   Blood pressure (!) 144/83, pulse (!) 108, temperature 98 F (36.7 C), resp. rate (!) 29, SpO2 95%.    FiO2 (%):  [40 %] 40 %   Intake/Output Summary (Last 24 hours) at 12/21/2023 0853 Last data filed at 12/20/2023 1219 Gross per 24 hour  Intake 394.24 ml  Output --  Net 394.24 ml   There were no vitals filed for this visit.  Examination: General: Acute on chronically ill-appearing female, sitting in bed, on BiPAP, with mild respiratory distress HENT: Atraumatic, normocephalic, neck supple, no JVD Lungs: Diminished breath sounds throughout with expiratory wheezing to bilateral mid and lower lung fields, BiPAP assisted, tachypneic, even Cardiovascular: Tachycardia, regular rhythm, S1-S2, no murmurs, rubs, gallops Abdomen: Soft, nontender, nondistended, no guarding or rebound tenderness, bowel sounds positive x 4 Extremities: Normal bulk and tone, no deformities, no edema, no cyanosis, good peripheral perfusion Neuro: Awake and alert, difficult to assess orientation due to work of breathing and BiPAP, follows simple commands, no focal deficits noted, pupils PERRLA GU: Deferred  Resolved Hospital Problem list     Assessment & Plan:   #Acute on Chronic Hypoxic & Hypercapnic  Respiratory Failure in the setting of Acute COPD Exacerbation, Influenza A Infection, and superimposed Strep Pneumoniae Pneumonia PMHx: COPD requiring 4L supplemental O2 at baseline, tobacco abuse -Supplemental O2 as needed to maintain O2 sats 88 to 92% -BiPAP, wean as tolerated -HIGH RISK FOR INTUBATION -Follow intermittent Chest X-ray &  ABG as needed -Bronchodilators & Pulmicort nebs -IV Steroids -ABX as above -Diuresis as BP and renal function permits ~ given 40 mg IV Lasix x1 dose on 1/26 -Prn Morphine for air hunger -Pulmonary toilet as able  #Sepsis (Meets SIRS Criteria: HR 120's, RR 30)  #Influenza A Infection #Superimposed Strep Pneumoniae Bacterial Pneumonia -Monitor fever curve -Trend WBC's & Procalcitonin -Follow cultures as above -Continue empiric Azithromycin and Ceftriaxone pending cultures & sensitivities  #Elevated Troponin, suspect demand ischemia #Tachycardia, suspect compensatory due to respiratory failure Echocardiogram 09/18/20: Normal LVEF, borderline diastolic dysfunction, normal valves -Continuous cardiac monitoring -Maintain MAP >65 -Vasopressors as needed to maintain MAP goal ~ NOT REQUIRING -Lactic acid is normalized -HS Troponin peaked at 502 -BNP 49 on 1/24 -Echocardiogram ordered and pending -Diuresis as BP and renal function permits ~ given 40 mg IV Lasix x1 dose on 1/26  #Hyperglycemia, suspect in setting of acute critical illness and steroids -Check Hgb A1c -CBG's q4h; Target range of 140 to 180 -SSI -Follow ICU Hypo/Hyperglycemia protocol      Best Practice (right click and "Reselect all SmartList Selections" daily)   Diet/type: NPO until respiratory status improved DVT prophylaxis: LMWH GI prophylaxis: PPI Lines: N/A Foley:  N/A Code  Status:  full code Last date of multidisciplinary goals of care discussion [1/26]  1/26: Pt updated at bedside on plan of care.  She confirms she is ok with intubation if needed, but would like to try and avoid it if able.  Will update pt's family when they arrive at bedside.  Labs   CBC: Recent Labs  Lab 12/19/23 2336 12/20/23 1038 12/21/23 0434  WBC 16.0* 10.9* 10.1  NEUTROABS 12.2*  --   --   HGB 13.5 12.5 11.3*  HCT 42.3 39.3 35.4*  MCV 85.8 86.4 86.3  PLT 374 268 260    Basic Metabolic Panel: Recent Labs  Lab  12/19/23 2336 12/20/23 1038 12/21/23 0434  NA 136 139 139  K 4.2 4.2 4.0  CL 93* 99 98  CO2 30 28 31   GLUCOSE 273* 171* 122*  BUN 11 14 22   CREATININE 0.53 0.41*  0.40* 0.40*  CALCIUM 8.8* 8.1* 7.9*   GFR: Estimated Creatinine Clearance: 54.9 mL/min (A) (by C-G formula based on SCr of 0.4 mg/dL (L)). Recent Labs  Lab 12/19/23 2336 12/20/23 0715 12/20/23 1038 12/20/23 1316 12/21/23 0434  PROCALCITON  --   --  1.08  --   --   WBC 16.0*  --  10.9*  --  10.1  LATICACIDVEN 0.9 1.3 1.2 0.6  --     Liver Function Tests: Recent Labs  Lab 12/19/23 2336 12/20/23 1038 12/21/23 0434  AST 29 36 49*  ALT 23 23 35  ALKPHOS 97 76 75  BILITOT 0.6 0.5 0.6  PROT 7.8 6.9 6.5  ALBUMIN 4.1 3.5 3.1*   No results for input(s): "LIPASE", "AMYLASE" in the last 168 hours. No results for input(s): "AMMONIA" in the last 168 hours.  ABG    Component Value Date/Time   HCO3 29.4 (H) 12/20/2023 0159   O2SAT 100 12/20/2023 0159     Coagulation Profile: No results for input(s): "INR", "PROTIME" in the last 168 hours.  Cardiac Enzymes: No results for input(s): "CKTOTAL", "CKMB", "CKMBINDEX", "TROPONINI" in the last 168 hours.  HbA1C: Hemoglobin A1C  Date/Time Value Ref Range Status  12/11/2023 04:01 PM 6.2 (A) 4.0 - 5.6 % Final   Hgb A1c MFr Bld  Date/Time Value Ref Range Status  05/22/2018 12:02 AM 6.2 (H) 4.8 - 5.6 % Final    Comment:    (NOTE) Pre diabetes:          5.7%-6.4% Diabetes:              >6.4% Glycemic control for   <7.0% adults with diabetes     CBG: Recent Labs  Lab 12/20/23 1612 12/20/23 2001 12/21/23 0033 12/21/23 0445 12/21/23 0759  GLUCAP 138* 125* 126* 120* 115*    Review of Systems:   Positives in BOLD: Gen: Denies fever, chills, weight change, fatigue, night sweats HEENT: Denies blurred vision, double vision, hearing loss, tinnitus, sinus congestion, rhinorrhea, sore throat, neck stiffness, dysphagia PULM: Denies shortness of breath, cough,  sputum production, hemoptysis, wheezing CV: Denies chest pain, edema, orthopnea, paroxysmal nocturnal dyspnea, palpitations GI: Denies abdominal pain, nausea, vomiting, diarrhea, hematochezia, melena, constipation, change in bowel habits GU: Denies dysuria, hematuria, polyuria, oliguria, urethral discharge Endocrine: Denies hot or cold intolerance, polyuria, polyphagia or appetite change Derm: Denies rash, dry skin, scaling or peeling skin change Heme: Denies easy bruising, bleeding, bleeding gums Neuro: Denies headache, numbness, weakness, slurred speech, loss of memory or consciousness   Past Medical History:  She,  has a past medical history of  Chronic respiratory failure with hypoxia (HCC), Cigarette smoker, COPD (chronic obstructive pulmonary disease) (HCC), Dependence on supplemental oxygen, Depression with anxiety, Endometriosis, GERD (gastroesophageal reflux disease), Grade I diastolic dysfunction, Hyperlipidemia, Kidney stones, and Reactive airway disease (03/08/2014).   Surgical History:   Past Surgical History:  Procedure Laterality Date   CATARACT EXTRACTION W/PHACO Right 12/04/2023   Procedure: CATARACT EXTRACTION PHACO AND INTRAOCULAR LENS PLACEMENT (IOC) RIGHT  CLAREON VIVITY LENS;  Surgeon: Estanislado Pandy, MD;  Location: [facility];  Service: Ophthalmology;  Laterality: Right;  10.40 1:04.0   CATARACT EXTRACTION W/PHACO Left 12/18/2023   Procedure: CATARACT EXTRACTION PHACO AND INTRAOCULAR LENS PLACEMENT (IOC) LEFT  CLAREON VIVITY TORIC 16.28, 01:19.2;  Surgeon: Estanislado Pandy, MD;  Location: [facility];  Service: Ophthalmology;  Laterality: Left;   COLONOSCOPY WITH PROPOFOL N/A 01/02/2023   Procedure: COLONOSCOPY WITH PROPOFOL;  Surgeon: Toney Reil, MD;  Location: [facility];  Service: Gastroenterology;  Laterality: N/A;   ESOPHAGOGASTRODUODENOSCOPY N/A 07/29/2018   Procedure: ESOPHAGOGASTRODUODENOSCOPY (EGD);  Surgeon: Toledo, Boykin Nearing, MD;  Location: [facility];  Service: Gastroenterology;  Laterality: N/A;   ESOPHAGOGASTRODUODENOSCOPY (EGD) WITH PROPOFOL N/A 01/02/2023   Procedure: ESOPHAGOGASTRODUODENOSCOPY (EGD) WITH PROPOFOL;  Surgeon: Toney Reil, MD;  Location: [facility];  Service: Gastroenterology;  Laterality: N/A;   KIDNEY STONE SURGERY     TONSILLECTOMY       Social History:   reports that she has been smoking cigarettes. She started smoking about 50 years ago. She has a 26.8 pack-year smoking history. She has never used smokeless tobacco. She reports current alcohol use. She reports that she does not use drugs.   Family History:  Her family history includes GER disease in her father and mother; Heart attack in her father; Hyperlipidemia in her mother; Hypertension in her father and mother; Parkinson's disease in her father.   Allergies Allergies  Allergen Reactions   Naproxen Itching   Sulfa Antibiotics Hives and Rash     Home Medications  Prior to Admission medications   Medication Sig Start Date End Date Taking? Authorizing Provider  ALPRAZolam (XANAX) 0.25 MG tablet Take 1 tablet (0.25 mg total) by mouth 3 (three) times daily as needed for anxiety. 12/11/23  Yes Abernathy, Arlyss Repress, NP  ascorbic acid (VITAMIN C) 500 MG tablet Take 500 mg by mouth daily.   Yes [provider]  aspirin 81 MG tablet Take 81 mg by mouth at bedtime.    Yes [provider]  azithromycin (ZITHROMAX) 250 MG tablet Use as directed for 5 days. 12/19/23  Yes McDonough, Salomon Fick, PA-C  Cholecalciferol 25 MCG (1000 UT) tablet Take 1,000 Units by mouth daily.    Yes [provider]  Coenzyme Q10 (CO Q-10) 100 MG CAPS Take 200 mg by mouth daily.    Yes [provider]  DULERA 200-5 MCG/ACT AERO INHALE 1 PUFF INTO THE LUNGS TWICE DAILY 05/23/23  Yes Abernathy, Alyssa, NP  DULoxetine (CYMBALTA) 60 MG capsule Take 1 capsule (60 mg total) by mouth daily. 11/02/23  Yes Lyndon Code, MD   famotidine (PEPCID) 40 MG tablet Take 1 tablet (40 mg total) by mouth 2 (two) times daily. 07/02/23  Yes Abernathy, Arlyss Repress, NP  furosemide (LASIX) 40 MG tablet TAKE 1 TABLET BY MOUTH DAILY 02/24/23  Yes Abernathy, Alyssa, NP  gemfibrozil (LOPID) 600 MG tablet Take 1 tablet (600 mg total) by mouth 2 (two) times daily before a meal. 12/11/23  Yes Abernathy, Arlyss Repress, NP  Ipratropium-Albuterol (COMBIVENT  RESPIMAT) 20-100 MCG/ACT AERS respimat INHALE 1 PUFF INTO THE LUNGS EVERY 6 HOURS AS NEEDED FOR WHEEZING OR SHORTNESS OF BREATH 10/31/23  Yes Lyndon Code, MD  Magnesium 400 MG TABS Take 400 mg by mouth daily.    Yes [provider]  mometasone (NASONEX) 50 MCG/ACT nasal spray Place 2 sprays into the nose daily.   Yes [provider]  montelukast (SINGULAIR) 10 MG tablet Take 1 tablet (10 mg total) by mouth daily. 01/31/23  Yes Abernathy, Arlyss Repress, NP  Multiple Vitamins-Minerals (MULTIVITAMIN WITH MINERALS) tablet Take 1 tablet by mouth daily.   Yes [provider]  omeprazole (PRILOSEC) 40 MG capsule Take 1 capsule (40 mg total) by mouth 2 (two) times daily. 12/04/23  Yes Abernathy, Arlyss Repress, NP  Potassium Chloride CR (MICRO-K) 8 MEQ CPCR capsule CR Take 1 capsule (8 mEq total) by mouth daily. 10/26/23  Yes Lyndon Code, MD  revefenacin [facility]) 175 MCG/3ML nebulizer solution USE 1 VIAL IN NEBULIZER DAILY 08/11/23  Yes Abernathy, Alyssa, NP  Roflumilast 250 MCG TABS TAKE 1 TABLET BY MOUTH EVERY DAY 11/27/23  Yes Abernathy, Alyssa, NP  simvastatin (ZOCOR) 20 MG tablet TAKE 1 TABLET(20 MG) BY MOUTH EVERY EVENING FOR CHOLESTEROL 11/06/23  Yes Abernathy, Alyssa, NP  SPIRIVA HANDIHALER 18 MCG inhalation capsule Place 1 capsule (18 mcg total) into inhaler and inhale daily. i 12/09/23  Yes Abernathy, Arlyss Repress, NP  vitamin E 180 MG (400 UNITS) capsule Take by mouth.   Yes [provider]  DENTA 5000 PLUS 1.1 % CREA dental cream Take 1 application by mouth daily. 04/14/21   [provider]  OXYGEN Inhale 4 L into the lungs continuous. Continuous oxygen and NIV at night, 2 liters   pt uses [facility] for oxygen supplies    [provider]  Varenicline Tartrate, Starter, 0.5 MG X 11 & 1 MG X 42 TBPK Take 0.5 mg by mouth once daily on days 1-3, then take 0.5 mg twice daily on days 4-7, then increase to 1 mg twice daily Patient not taking: Reported on 12/20/2023 08/13/23   Sallyanne Kuster, NP     Critical care time: 55 minutes     Harlon Ditty, AGACNP-BC [facility] Prefer epic messenger for cross cover needs If after hours, please call E-link

## 2023-12-21 NOTE — Progress Notes (Signed)
Echocardiogram 2D Echocardiogram has been performed.  Rachael Blankenship 12/21/2023, 4:29 PM

## 2023-12-21 NOTE — ED Notes (Signed)
Pt placed back on BiPap per Dr. Georgeann Oppenheim due to air hunger. Respiratory at bedside. IV meds ordered.

## 2023-12-21 NOTE — ED Notes (Signed)
Pt asking for sip of water. Given by this RN.

## 2023-12-21 NOTE — Progress Notes (Signed)
Patient evaluated this a.m.  Remains on NIPPV overnight.  Attempted to wean off BiPAP.  On my evaluation patient was tachypneic breathing in the 30s with diffuse wheezing in all lung fields associated with sinus tachycardia ventricular rates 130s.  She was optimized on bronchodilators and steroid regimen overnight.  Case discussed with PCCM team.  Plan: Patient will be transferred to ICU service.  She received 40 mg IV Lasix and 2 mg IV morphine with good result.  Seems to be tolerating BiPAP at the moment.  Will remain very high risk for endotracheal intubation and mechanical ventilation.  Care to be assumed by [facility] service.  Care discussed with PCCM attending. [facility] hospitalist service to sign off at this time.  Please reconsult our service when patient stable for transfer back to general medical floor.  Lolita Patella MD

## 2023-12-21 NOTE — Progress Notes (Signed)
Pt's husband updated at bedside on plan of care and high risk for intubation. He is in agreement with Full Code and intubation if needed.  All questions answered, he is appreciative of update.     Harlon Ditty, AGACNP-BC [facility] Prefer epic messenger for cross cover needs If after hours, please call E-link

## 2023-12-22 ENCOUNTER — Inpatient Hospital Stay: Payer: Medicare Other

## 2023-12-22 LAB — ECHOCARDIOGRAM COMPLETE
AV Peak grad: 14.1 mm[Hg]
Ao pk vel: 1.88 m/s
Area-P 1/2: 8.25 cm2
Height: 63 in
Weight: 2102.31 [oz_av]

## 2023-12-22 LAB — RESPIRATORY PANEL BY PCR

## 2023-12-22 LAB — GLUCOSE, CAPILLARY
Glucose-Capillary: 132 mg/dL — ABNORMAL HIGH (ref 70–99)
Glucose-Capillary: 109 mg/dL — ABNORMAL HIGH (ref 70–99)
Glucose-Capillary: 135 mg/dL — ABNORMAL HIGH (ref 70–99)
Glucose-Capillary: 168 mg/dL — ABNORMAL HIGH (ref 70–99)
Glucose-Capillary: 164 mg/dL — ABNORMAL HIGH (ref 70–99)

## 2023-12-22 LAB — CBC
HCT: 35.8 % — ABNORMAL LOW (ref 36.0–46.0)
Hemoglobin: 11.3 g/dL — ABNORMAL LOW (ref 12.0–15.0)
MCH: 27.8 pg (ref 26.0–34.0)
MCHC: 31.6 g/dL (ref 30.0–36.0)
MCV: 88.2 fL (ref 80.0–100.0)
Platelets: 256 10*3/uL (ref 150–400)
RBC: 4.06 MIL/uL (ref 3.87–5.11)
RDW: 14.7 % (ref 11.5–15.5)
WBC: 6.9 10*3/uL (ref 4.0–10.5)
nRBC: 0 % (ref 0.0–0.2)

## 2023-12-22 LAB — RENAL FUNCTION PANEL
Albumin: 3.1 g/dL — ABNORMAL LOW (ref 3.5–5.0)
Anion gap: 11 (ref 5–15)
BUN: 33 mg/dL — ABNORMAL HIGH (ref 8–23)
CO2: 34 mmol/L — ABNORMAL HIGH (ref 22–32)
Calcium: 8.3 mg/dL — ABNORMAL LOW (ref 8.9–10.3)
Chloride: 98 mmol/L (ref 98–111)
Creatinine, Ser: 0.5 mg/dL (ref 0.44–1.00)
GFR, Estimated: >60 mL/min (ref >60)
Glucose, Bld: 133 mg/dL — ABNORMAL HIGH (ref 70–99)
Phosphorus: 1.8 mg/dL — ABNORMAL LOW (ref 2.5–4.6)
Potassium: 4 mmol/L (ref 3.5–5.1)
Sodium: 143 mmol/L (ref 135–145)

## 2023-12-22 LAB — PHOSPHORUS: Phosphorus: 4.3 mg/dL (ref 2.5–4.6)

## 2023-12-22 LAB — PROCALCITONIN: Procalcitonin: 0.63 ng/mL

## 2023-12-22 LAB — MAGNESIUM: Magnesium: 2.4 mg/dL (ref 1.7–2.4)

## 2023-12-22 MED ORDER — FAMOTIDINE 20 MG PO TABS
20.0000 mg | ORAL_TABLET | Freq: Two times a day (BID) | ORAL | Status: DC
  Administered 2023-12-22 – 2023-12-26 (×8): 20 mg via ORAL
  Filled 2023-12-22 (×8): qty 1

## 2023-12-22 MED ORDER — ADULT MULTIVITAMIN W/MINERALS CH
1.0000 | ORAL_TABLET | Freq: Every day | ORAL | Status: DC
  Administered 2023-12-23 – 2023-12-26 (×4): 1 via ORAL
  Filled 2023-12-22 (×4): qty 1

## 2023-12-22 MED ORDER — NICOTINE 14 MG/24HR TD PT24
14.0000 mg | MEDICATED_PATCH | Freq: Every day | TRANSDERMAL | Status: DC
  Administered 2023-12-22 – 2023-12-26 (×5): 14 mg via TRANSDERMAL
  Filled 2023-12-22 (×5): qty 1

## 2023-12-22 MED ORDER — K PHOS MONO-SOD PHOS DI & MONO 155-852-130 MG PO TABS
500.0000 mg | ORAL_TABLET | Freq: Two times a day (BID) | ORAL | Status: DC
  Filled 2023-12-22: qty 2

## 2023-12-22 MED ORDER — METHYLPREDNISOLONE SODIUM SUCC 40 MG IJ SOLR
40.0000 mg | Freq: Once | INTRAMUSCULAR | Status: AC
  Administered 2023-12-22: 40 mg via INTRAVENOUS
  Filled 2023-12-22: qty 1

## 2023-12-22 MED ORDER — FUROSEMIDE 10 MG/ML IJ SOLN
40.0000 mg | Freq: Once | INTRAMUSCULAR | Status: AC
  Administered 2023-12-22: 40 mg via INTRAVENOUS
  Filled 2023-12-22: qty 4

## 2023-12-22 MED ORDER — HYDRALAZINE HCL 20 MG/ML IJ SOLN
10.0000 mg | INTRAMUSCULAR | Status: DC | PRN
  Administered 2023-12-22 – 2023-12-24 (×3): 10 mg via INTRAVENOUS
  Filled 2023-12-22 (×4): qty 1

## 2023-12-22 MED ORDER — SODIUM PHOSPHATES 45 MMOLE/15ML IV SOLN
30.0000 mmol | Freq: Once | INTRAVENOUS | Status: AC
  Administered 2023-12-22: 30 mmol via INTRAVENOUS
  Filled 2023-12-22: qty 10

## 2023-12-22 NOTE — Plan of Care (Signed)
  Problem: Fluid Volume: Goal: Ability to maintain a balanced intake and output will improve Outcome: Progressing   Problem: Metabolic: Goal: Ability to maintain appropriate glucose levels will improve Outcome: Progressing   Problem: Skin Integrity: Goal: Risk for impaired skin integrity will decrease Outcome: Progressing   Problem: Tissue Perfusion: Goal: Adequacy of tissue perfusion will improve Outcome: Progressing   Problem: Clinical Measurements: Goal: Diagnostic test results will improve Outcome: Progressing Goal: Respiratory complications will improve Outcome: Progressing Goal: Cardiovascular complication will be avoided Outcome: Progressing

## 2023-12-22 NOTE — Progress Notes (Signed)
NAME:  KYLA DUFFY, MRN:  [id number], DOB:  [birth year], LOS: 2 ADMISSION DATE:  12/19/2023, CONSULTATION DATE:  12/21/2023 REFERRING MD:  Dr. Georgeann Oppenheim, CHIEF COMPLAINT:  Acute on Chronic Hypoxic Respiratory Failure   Brief Pt Description / Synopsis:  68 y.o. female admitted with Acute on Chronic Hypoxic & Hypercapnic Respiratory Failure in the setting of Acute COPD Exacerbation, Influenza A Infection, and superimposed Strep Pneumoniae Pneumonia requiring BiPAP.  High risk for intubation.  History of Present Illness:  Karoline Fleer is a 68 y.o. female with a past medical history of COPD and chronic hypoxic respiratory failure on 4 L baseline who presented to the ED with chief complaint of severe respiratory distress with associated fever and cough.  Pt currently with respiratory distress on BiPAP, therefore history is obtained from chart review.  Per H&P from Hospitalists, she had progressive cough and shortness of breath over several days.  She had minimal relief with nebulizer treatments and Azithromycin at home.  Upon EMS arrival she noted to have severe respiratory distress of which she was placed on non-rebreather mask and  given magnesium, solumedrol and nebulizers.  In group she became transiently unresponsive and required placement of BiPAP on upon arrival.  ED Course: Initial Vital Signs: Temperature 99 Fahrenheit orally, pulse 126, respiratory rate 25, blood pressure 186/111, SpO2 100% on nonrebreather Significant Labs: Glucose 273, BNP 49, high-sensitivity troponin peaked at 582, lactic acid 0.9, WBC 16.0, procalcitonin 1.08 Positive for influenza A Strep pneumo urinary antigen positive Imaging Chest X-ray>>IMPRESSION: 1. No active disease. 2. Aortic Atherosclerosis    She was admitted by the hospitalist for further workup and treatment.  Please see significant hospital events section below for full detailed hospital course.  12/22/23- for TRH today, reduced phlegm, off bipap  today.  Procalcitonin is improved. CBC stable , bmp with electrolyte derrangements with pharmacy consultation for repletion.    Pertinent  Medical History   Past Medical History:  Diagnosis Date   Chronic respiratory failure with hypoxia (HCC)    Cigarette smoker    COPD (chronic obstructive pulmonary disease) (HCC)    Dependence on supplemental oxygen    Depression with anxiety    Endometriosis    GERD (gastroesophageal reflux disease)    Grade I diastolic dysfunction    Hyperlipidemia    Kidney stones    Reactive airway disease 03/08/2014    Micro Data:  1/24: COVID/RSV/FLU PCR>>  + Influenza A 1/24: Blood culture x2>> no growth to date 1/25: Strep Pneumo urinary antigen + 1/25: HIV Screen>> nonreactive  Antimicrobials:   Anti-infectives (From admission, onward)    Start     Dose/Rate Route Frequency Ordered Stop   12/21/23 1000  azithromycin (ZITHROMAX) 500 mg in sodium chloride 0.9 % 250 mL IVPB        500 mg 250 mL/hr over 60 Minutes Intravenous Every 24 hours 12/20/23 1431 12/24/23 0959   12/21/23 0900  cefTRIAXone (ROCEPHIN) 2 g in sodium chloride 0.9 % 100 mL IVPB        2 g 200 mL/hr over 30 Minutes Intravenous Every 24 hours 12/20/23 1431 12/26/23 0859   12/20/23 1000  oseltamivir (TAMIFLU) capsule 30 mg        30 mg Oral 2 times daily 12/20/23 0911 12/25/23 0959   12/20/23 0900  cefTRIAXone (ROCEPHIN) 1 g in sodium chloride 0.9 % 100 mL IVPB  Status:  Discontinued        1 g 200 mL/hr over 30 Minutes Intravenous Every  24 hours 12/20/23 0748 12/20/23 1431   12/20/23 0900  azithromycin (ZITHROMAX) 500 mg in sodium chloride 0.9 % 250 mL IVPB  Status:  Discontinued        500 mg 250 mL/hr over 60 Minutes Intravenous Every 24 hours 12/20/23 0748 12/20/23 1431       Significant Hospital Events: Including procedures, antibiotic start and stop dates in addition to other pertinent events   1/25: Admitted by [facility] with COPD Exacerbation, Influenza infection, and  Strep Pneumo pneumonia.  Requiring BiPAP 1/26: Remains BiPAP dependent with increased WOB.  High risk for intubation.  PCCM consulted.    Objective   Blood pressure (!) 150/84, pulse 98, temperature 97.7 F (36.5 C), temperature source Axillary, resp. rate (!) 22, height 5\' 3"  (1.6 m), weight 59.6 kg, SpO2 97%.    FiO2 (%):  [30 %-40 %] 40 %   Intake/Output Summary (Last 24 hours) at 12/22/2023 1118 Last data filed at 12/22/2023 1016 Gross per 24 hour  Intake 1080.55 ml  Output 501 ml  Net 579.55 ml   Filed Weights   12/21/23 1135  Weight: 59.6 kg    Examination: General: Acute on chronically ill-appearing female, NAD HENT: Atraumatic, normocephalic, neck supple, no JVD Lungs: mild rhonchi b/l Cardiovascular: Tachycardia, regular rhythm, S1-S2, no murmurs, rubs, gallops Abdomen: Soft, nontender, nondistended, no guarding or rebound tenderness, bowel sounds positive x 4 Extremities: Normal bulk and tone, no deformities, no edema, no cyanosis, good peripheral perfusion Neuro: Aaox 2 follows simple commands, no focal deficits noted, pupils PERRLA GU: Deferred  Resolved Hospital Problem list     Assessment & Plan:   #Acute on Chronic Hypoxic & Hypercapnic  Respiratory Failure in the setting of Acute COPD Exacerbation, Influenza A Infection, and superimposed Strep Pneumoniae Pneumonia PMHx: COPD requiring 4L supplemental O2 at baseline, tobacco abuse -Supplemental O2 as needed to maintain O2 sats 88 to 92% -Bronchodilators -IV Steroids- reduced dose -Pulmonary toilet as able  #Sepsis (-PRESENT ON ADMISSION DUE TO PNEUMONIA #Influenza A Infection #Superimposed Strep Pneumoniae Bacterial Pneumonia -Monitor fever curve -Trend WBC's & Procalcitonin -Follow cultures as above -Continue empiric Azithromycin and Ceftriaxone pending cultures & sensitivities  #Elevated Troponin, suspect demand ischemia #Tachycardia, suspect compensatory due to respiratory failure Echocardiogram  09/18/20: Normal LVEF, borderline diastolic dysfunction, normal valves -Continuous cardiac monitoring -Maintain MAP >65 -Vasopressors as needed to maintain MAP goal ~ NOT REQUIRING -Lactic acid is normalized -HS Troponin peaked at 502 -BNP 49 on 1/24 -Echocardiogram ordered and pending -Diuresis as BP and renal function permits ~ given 40 mg IV Lasix x1 dose on 1/26  #Hyperglycemia, suspect in setting of acute critical illness and steroids -Check Hgb A1c -CBG's q4h; Target range of 140 to 180 -SSI -Follow ICU Hypo/Hyperglycemia protocol      Best Practice (right click and "Reselect all SmartList Selections" daily)   Diet/type: NPO until respiratory status improved DVT prophylaxis: LMWH GI prophylaxis: PPI Lines: N/A Foley:  N/A Code Status:  full code Last date of multidisciplinary goals of care discussion [1/26]   Labs   CBC: Recent Labs  Lab 12/19/23 2336 12/20/23 1038 12/21/23 0434 12/22/23 0334  WBC 16.0* 10.9* 10.1 6.9  NEUTROABS 12.2*  --   --   --   HGB 13.5 12.5 11.3* 11.3*  HCT 42.3 39.3 35.4* 35.8*  MCV 85.8 86.4 86.3 88.2  PLT 374 268 260 256    Basic Metabolic Panel: Recent Labs  Lab 12/19/23 2336 12/20/23 1038 12/21/23 0434 12/22/23 0334  NA 136  139 139 143  K 4.2 4.2 4.0 4.0  CL 93* 99 98 98  CO2 30 28 31  34*  GLUCOSE 273* 171* 122* 133*  BUN 11 14 22  33*  CREATININE 0.53 0.41*  0.40* 0.40* 0.50  CALCIUM 8.8* 8.1* 7.9* 8.3*  MG  --   --   --  2.4  PHOS  --   --   --  1.8*   GFR: Estimated Creatinine Clearance (by C-G formula based on SCr of 0.5 mg/dL) Female: 40.9 mL/min Female: 70.1 mL/min Recent Labs  Lab 12/19/23 2336 12/20/23 0715 12/20/23 1038 12/20/23 1316 12/21/23 0434 12/22/23 0334  PROCALCITON  --   --  1.08  --   --  0.63  WBC 16.0*  --  10.9*  --  10.1 6.9  LATICACIDVEN 0.9 1.3 1.2 0.6  --   --     Liver Function Tests: Recent Labs  Lab 12/19/23 2336 12/20/23 1038 12/21/23 0434 12/22/23 0334  AST 29 36  49*  --   ALT 23 23 35  --   ALKPHOS 97 76 75  --   BILITOT 0.6 0.5 0.6  --   PROT 7.8 6.9 6.5  --   ALBUMIN 4.1 3.5 3.1* 3.1*   No results for input(s): "LIPASE", "AMYLASE" in the last 168 hours. No results for input(s): "AMMONIA" in the last 168 hours.  ABG    Component Value Date/Time   HCO3 29.4 (H) 12/20/2023 0159   O2SAT 100 12/20/2023 0159     Coagulation Profile: No results for input(s): "INR", "PROTIME" in the last 168 hours.  Cardiac Enzymes: No results for input(s): "CKTOTAL", "CKMB", "CKMBINDEX", "TROPONINI" in the last 168 hours.  HbA1C: Hemoglobin A1C  Date/Time Value Ref Range Status  12/11/2023 04:01 PM 6.2 (A) 4.0 - 5.6 % Final   Hgb A1c MFr Bld  Date/Time Value Ref Range Status  05/22/2018 12:02 AM 6.2 (H) 4.8 - 5.6 % Final    Comment:    (NOTE) Pre diabetes:          5.7%-6.4% Diabetes:              >6.4% Glycemic control for   <7.0% adults with diabetes     CBG: Recent Labs  Lab 12/21/23 1211 12/21/23 1716 12/21/23 2320 12/22/23 0557 12/22/23 0810  GLUCAP 171* 122* 134* 132* 109*    Review of Systems:   Positives in BOLD: Gen: Denies fever, chills, weight change, fatigue, night sweats HEENT: Denies blurred vision, double vision, hearing loss, tinnitus, sinus congestion, rhinorrhea, sore throat, neck stiffness, dysphagia PULM: Denies shortness of breath, cough, sputum production, hemoptysis, wheezing CV: Denies chest pain, edema, orthopnea, paroxysmal nocturnal dyspnea, palpitations GI: Denies abdominal pain, nausea, vomiting, diarrhea, hematochezia, melena, constipation, change in bowel habits GU: Denies dysuria, hematuria, polyuria, oliguria, urethral discharge Endocrine: Denies hot or cold intolerance, polyuria, polyphagia or appetite change Derm: Denies rash, dry skin, scaling or peeling skin change Heme: Denies easy bruising, bleeding, bleeding gums Neuro: Denies headache, numbness, weakness, slurred speech, loss of memory or  consciousness   Past Medical History:  She,  has a past medical history of Chronic respiratory failure with hypoxia (HCC), Cigarette smoker, COPD (chronic obstructive pulmonary disease) (HCC), Dependence on supplemental oxygen, Depression with anxiety, Endometriosis, GERD (gastroesophageal reflux disease), Grade I diastolic dysfunction, Hyperlipidemia, Kidney stones, and Reactive airway disease (03/08/2014).   Surgical History:   Past Surgical History:  Procedure Laterality Date   CATARACT EXTRACTION W/PHACO Right 12/04/2023   Procedure: CATARACT  EXTRACTION PHACO AND INTRAOCULAR LENS PLACEMENT (IOC) RIGHT  CLAREON VIVITY LENS;  Surgeon: Estanislado Pandy, MD;  Location: [facility];  Service: Ophthalmology;  Laterality: Right;  10.40 1:04.0   CATARACT EXTRACTION W/PHACO Left 12/18/2023   Procedure: CATARACT EXTRACTION PHACO AND INTRAOCULAR LENS PLACEMENT (IOC) LEFT  CLAREON VIVITY TORIC 16.28, 01:19.2;  Surgeon: Estanislado Pandy, MD;  Location: [facility];  Service: Ophthalmology;  Laterality: Left;   COLONOSCOPY WITH PROPOFOL N/A 01/02/2023   Procedure: COLONOSCOPY WITH PROPOFOL;  Surgeon: Toney Reil, MD;  Location: [facility];  Service: Gastroenterology;  Laterality: N/A;   ESOPHAGOGASTRODUODENOSCOPY N/A 07/29/2018   Procedure: ESOPHAGOGASTRODUODENOSCOPY (EGD);  Surgeon: Toledo, Boykin Nearing, MD;  Location: [facility];  Service: Gastroenterology;  Laterality: N/A;   ESOPHAGOGASTRODUODENOSCOPY (EGD) WITH PROPOFOL N/A 01/02/2023   Procedure: ESOPHAGOGASTRODUODENOSCOPY (EGD) WITH PROPOFOL;  Surgeon: Toney Reil, MD;  Location: [facility];  Service: Gastroenterology;  Laterality: N/A;   KIDNEY STONE SURGERY     TONSILLECTOMY       Social History:   reports that she has been smoking cigarettes. She started smoking about 50 years ago. She has a 26.8 pack-year smoking history. She has never used smokeless tobacco. She reports current alcohol use. She  reports that she does not use drugs.   Family History:  Her family history includes GER disease in her father and mother; Heart attack in her father; Hyperlipidemia in her mother; Hypertension in her father and mother; Parkinson's disease in her father.   Allergies Allergies  Allergen Reactions   Naproxen Itching   Sulfa Antibiotics Hives and Rash     Home Medications  Prior to Admission medications   Medication Sig Start Date End Date Taking? Authorizing Provider  ALPRAZolam (XANAX) 0.25 MG tablet Take 1 tablet (0.25 mg total) by mouth 3 (three) times daily as needed for anxiety. 12/11/23  Yes Abernathy, Arlyss Repress, NP  ascorbic acid (VITAMIN C) 500 MG tablet Take 500 mg by mouth daily.   Yes [provider]  aspirin 81 MG tablet Take 81 mg by mouth at bedtime.    Yes [provider]  azithromycin (ZITHROMAX) 250 MG tablet Use as directed for 5 days. 12/19/23  Yes McDonough, Salomon Fick, PA-C  Cholecalciferol 25 MCG (1000 UT) tablet Take 1,000 Units by mouth daily.    Yes [provider]  Coenzyme Q10 (CO Q-10) 100 MG CAPS Take 200 mg by mouth daily.    Yes [provider]  DULERA 200-5 MCG/ACT AERO INHALE 1 PUFF INTO THE LUNGS TWICE DAILY 05/23/23  Yes Abernathy, Alyssa, NP  DULoxetine (CYMBALTA) 60 MG capsule Take 1 capsule (60 mg total) by mouth daily. 11/02/23  Yes Lyndon Code, MD  famotidine (PEPCID) 40 MG tablet Take 1 tablet (40 mg total) by mouth 2 (two) times daily. 07/02/23  Yes Abernathy, Arlyss Repress, NP  furosemide (LASIX) 40 MG tablet TAKE 1 TABLET BY MOUTH DAILY 02/24/23  Yes Abernathy, Alyssa, NP  gemfibrozil (LOPID) 600 MG tablet Take 1 tablet (600 mg total) by mouth 2 (two) times daily before a meal. 12/11/23  Yes Abernathy, Alyssa, NP  Ipratropium-Albuterol (COMBIVENT RESPIMAT) 20-100 MCG/ACT AERS respimat INHALE 1 PUFF INTO THE LUNGS EVERY 6 HOURS AS NEEDED FOR WHEEZING OR SHORTNESS OF BREATH 10/31/23  Yes Lyndon Code, MD  Magnesium 400 MG TABS Take  400 mg by mouth daily.    Yes [provider]  mometasone (NASONEX) 50 MCG/ACT nasal spray Place 2 sprays into the nose daily.  Yes [provider]  montelukast (SINGULAIR) 10 MG tablet Take 1 tablet (10 mg total) by mouth daily. 01/31/23  Yes Abernathy, Arlyss Repress, NP  Multiple Vitamins-Minerals (MULTIVITAMIN WITH MINERALS) tablet Take 1 tablet by mouth daily.   Yes [provider]  omeprazole (PRILOSEC) 40 MG capsule Take 1 capsule (40 mg total) by mouth 2 (two) times daily. 12/04/23  Yes Abernathy, Arlyss Repress, NP  Potassium Chloride CR (MICRO-K) 8 MEQ CPCR capsule CR Take 1 capsule (8 mEq total) by mouth daily. 10/26/23  Yes Lyndon Code, MD  revefenacin [facility]) 175 MCG/3ML nebulizer solution USE 1 VIAL IN NEBULIZER DAILY 08/11/23  Yes Abernathy, Alyssa, NP  Roflumilast 250 MCG TABS TAKE 1 TABLET BY MOUTH EVERY DAY 11/27/23  Yes Abernathy, Alyssa, NP  simvastatin (ZOCOR) 20 MG tablet TAKE 1 TABLET(20 MG) BY MOUTH EVERY EVENING FOR CHOLESTEROL 11/06/23  Yes Abernathy, Alyssa, NP  SPIRIVA HANDIHALER 18 MCG inhalation capsule Place 1 capsule (18 mcg total) into inhaler and inhale daily. i 12/09/23  Yes Abernathy, Arlyss Repress, NP  vitamin E 180 MG (400 UNITS) capsule Take by mouth.   Yes [provider]  DENTA 5000 PLUS 1.1 % CREA dental cream Take 1 application by mouth daily. 04/14/21   [provider]  OXYGEN Inhale 4 L into the lungs continuous. Continuous oxygen and NIV at night, 2 liters   pt uses [facility] for oxygen supplies    [provider]  Varenicline Tartrate, Starter, 0.5 MG X 11 & 1 MG X 42 TBPK Take 0.5 mg by mouth once daily on days 1-3, then take 0.5 mg twice daily on days 4-7, then increase to 1 mg twice daily Patient not taking: Reported on 12/20/2023 08/13/23   Sallyanne Kuster, NP     Critical care provider statement:   Total critical care time: 34 minutes   Performed by: Karna Christmas MD   Critical care time was exclusive of separately billable  procedures and treating other patients.   Critical care was necessary to treat or prevent imminent or life-threatening deterioration.   Critical care was time spent personally by me on the following activities: development of treatment plan with patient and/or surrogate as well as nursing, discussions with consultants, evaluation of patient's response to treatment, examination of patient, obtaining history from patient or surrogate, ordering and performing treatments and interventions, ordering and review of laboratory studies, ordering and review of radiographic studies, pulse oximetry and re-evaluation of patient's condition.    Vida Rigger, M.D.  Pulmonary & Critical Care Medicine

## 2023-12-22 NOTE — Progress Notes (Signed)
PHARMACY CONSULT NOTE - FOLLOW UP  Pharmacy Consult for Electrolyte Monitoring and Replacement   Recent Labs: Potassium (mmol/L)  Date Value  12/22/2023 4.0   Magnesium (mg/dL)  Date Value  40/98/1191 2.4   Calcium (mg/dL)  Date Value  47/82/9562 8.3 (L)   Albumin (g/dL)  Date Value  13/06/6577 3.1 (L)  11/21/2023 4.4   Phosphorus (mg/dL)  Date Value  46/96/2952 1.8 (L)   Sodium (mmol/L)  Date Value  12/22/2023 143  11/21/2023 141     Assessment: 68 year old female history of COPD and chronic hypoxic respiratory failure on 4 L baseline who presented to the ED with chief complaint of severe respiratory distress. Pharmacy is asked to follow and replace electrolytes while in CCU   Goal of Therapy:  Electrolytes WNL  Plan:  ---30 mmol IV sodium phosphate x 1 ---recheck electrolytes in am  Lowella Bandy ,PharmD Clinical Pharmacist 12/22/2023 7:14 AM

## 2023-12-22 NOTE — Plan of Care (Signed)
  Problem: Education: Goal: Ability to describe self-care measures that may prevent or decrease complications (Diabetes Survival Skills Education) will improve Outcome: Progressing   Problem: Coping: Goal: Ability to adjust to condition or change in health will improve Outcome: Progressing   Problem: Fluid Volume: Goal: Ability to maintain a balanced intake and output will improve Outcome: Progressing   Problem: Nutritional: Goal: Maintenance of adequate nutrition will improve Outcome: Progressing   Problem: Clinical Measurements: Goal: Respiratory complications will improve Outcome: Progressing   Problem: Nutrition: Goal: Adequate nutrition will be maintained Outcome: Progressing   Problem: Coping: Goal: Level of anxiety will decrease Outcome: Progressing

## 2023-12-23 DIAGNOSIS — J9612 Chronic respiratory failure with hypercapnia: Secondary | ICD-10-CM

## 2023-12-23 DIAGNOSIS — J441 Chronic obstructive pulmonary disease with (acute) exacerbation: Secondary | ICD-10-CM | POA: Diagnosis not present

## 2023-12-23 DIAGNOSIS — A491 Streptococcal infection, unspecified site: Secondary | ICD-10-CM | POA: Diagnosis not present

## 2023-12-23 DIAGNOSIS — R739 Hyperglycemia, unspecified: Secondary | ICD-10-CM

## 2023-12-23 DIAGNOSIS — J1100 Influenza due to unidentified influenza virus with unspecified type of pneumonia: Secondary | ICD-10-CM | POA: Diagnosis not present

## 2023-12-23 DIAGNOSIS — J9601 Acute respiratory failure with hypoxia: Secondary | ICD-10-CM | POA: Diagnosis not present

## 2023-12-23 LAB — GLUCOSE, CAPILLARY
Glucose-Capillary: 128 mg/dL — ABNORMAL HIGH (ref 70–99)
Glucose-Capillary: 224 mg/dL — ABNORMAL HIGH (ref 70–99)
Glucose-Capillary: 150 mg/dL — ABNORMAL HIGH (ref 70–99)
Glucose-Capillary: 156 mg/dL — ABNORMAL HIGH (ref 70–99)

## 2023-12-23 LAB — CBC
HCT: 35.5 % — ABNORMAL LOW (ref 36.0–46.0)
Hemoglobin: 11.4 g/dL — ABNORMAL LOW (ref 12.0–15.0)
MCH: 27.2 pg (ref 26.0–34.0)
MCHC: 32.1 g/dL (ref 30.0–36.0)
MCV: 84.7 fL (ref 80.0–100.0)
Platelets: 281 10*3/uL (ref 150–400)
RBC: 4.19 MIL/uL (ref 3.87–5.11)
RDW: 14.6 % (ref 11.5–15.5)
WBC: 4.4 10*3/uL (ref 4.0–10.5)
nRBC: 0 % (ref 0.0–0.2)

## 2023-12-23 LAB — RENAL FUNCTION PANEL
Albumin: 3 g/dL — ABNORMAL LOW (ref 3.5–5.0)
Anion gap: 9 (ref 5–15)
BUN: 32 mg/dL — ABNORMAL HIGH (ref 8–23)
CO2: 35 mmol/L — ABNORMAL HIGH (ref 22–32)
Calcium: 8.2 mg/dL — ABNORMAL LOW (ref 8.9–10.3)
Chloride: 97 mmol/L — ABNORMAL LOW (ref 98–111)
Creatinine, Ser: 0.39 mg/dL — ABNORMAL LOW (ref 0.44–1.00)
GFR, Estimated: >60 mL/min (ref >60)
Glucose, Bld: 147 mg/dL — ABNORMAL HIGH (ref 70–99)
Phosphorus: 2.6 mg/dL (ref 2.5–4.6)
Potassium: 3.5 mmol/L (ref 3.5–5.1)
Sodium: 141 mmol/L (ref 135–145)

## 2023-12-23 LAB — PROCALCITONIN: Procalcitonin: 0.31 ng/mL

## 2023-12-23 LAB — MAGNESIUM: Magnesium: 2.2 mg/dL (ref 1.7–2.4)

## 2023-12-23 MED ORDER — ORAL CARE MOUTH RINSE
15.0000 mL | OROMUCOSAL | Status: DC
  Administered 2023-12-24 – 2023-12-26 (×7): 15 mL via OROMUCOSAL

## 2023-12-23 MED ORDER — ORAL CARE MOUTH RINSE: 15.0000 mL | OROMUCOSAL | Status: DC | PRN

## 2023-12-23 MED ORDER — POTASSIUM CHLORIDE CRYS ER 20 MEQ PO TBCR
40.0000 meq | EXTENDED_RELEASE_TABLET | Freq: Once | ORAL | Status: AC
Start: 2023-12-23 — End: 2023-12-23
  Administered 2023-12-23: 40 meq via ORAL
  Filled 2023-12-23: qty 2

## 2023-12-23 NOTE — Plan of Care (Signed)
  Problem: Education: Goal: Ability to describe self-care measures that may prevent or decrease complications (Diabetes Survival Skills Education) will improve Outcome: Progressing Goal: Individualized Educational Video(s) Outcome: Progressing   Problem: Coping: Goal: Ability to adjust to condition or change in health will improve Outcome: Progressing   Problem: Fluid Volume: Goal: Ability to maintain a balanced intake and output will improve Outcome: Progressing   Problem: Health Behavior/Discharge Planning: Goal: Ability to identify and utilize available resources and services will improve Outcome: Progressing Goal: Ability to manage health-related needs will improve Outcome: Progressing   Problem: Metabolic: Goal: Ability to maintain appropriate glucose levels will improve Outcome: Progressing   Problem: Nutritional: Goal: Maintenance of adequate nutrition will improve Outcome: Progressing Goal: Progress toward achieving an optimal weight will improve Outcome: Progressing   Problem: Skin Integrity: Goal: Risk for impaired skin integrity will decrease Outcome: Progressing   Problem: Tissue Perfusion: Goal: Adequacy of tissue perfusion will improve Outcome: Progressing   Problem: Education: Goal: Knowledge of General Education information will improve Description: Including pain rating scale, medication(s)/side effects and non-pharmacologic comfort measures Outcome: Progressing   Problem: Health Behavior/Discharge Planning: Goal: Ability to manage health-related needs will improve Outcome: Progressing   Problem: Clinical Measurements: Goal: Ability to maintain clinical measurements within normal limits will improve Outcome: Progressing Goal: Will remain free from infection Outcome: Progressing Goal: Diagnostic test results will improve Outcome: Progressing Goal: Cardiovascular complication will be avoided Outcome: Progressing   Problem: Nutrition: Goal:  Adequate nutrition will be maintained Outcome: Progressing   Problem: Elimination: Goal: Will not experience complications related to bowel motility Outcome: Progressing Goal: Will not experience complications related to urinary retention Outcome: Progressing   Problem: Pain Managment: Goal: General experience of comfort will improve and/or be controlled Outcome: Progressing   Problem: Safety: Goal: Ability to remain free from injury will improve Outcome: Progressing   Problem: Skin Integrity: Goal: Risk for impaired skin integrity will decrease Outcome: Progressing

## 2023-12-23 NOTE — Progress Notes (Signed)
Progress Note   Patient: Rachael Blankenship:[id number] DOB: 06-04-1956 DOA: 12/19/2023     3 DOS: the patient was seen and examined on 12/23/2023   Brief hospital course: Rachael Blankenship is a 68 y.o. female with a past medical history of COPD and chronic hypoxic respiratory failure on 4 L baseline who presented to the ED with chief complaint of severe respiratory distress with associated fever and cough.  She is initially admitted to [facility] service and later transferred to ICU as she is requiring BiPAP and is at high risk for intubation.  Patient tested positive for influenza A infection and superimposed strep pneumonia pneumonia.  Patient was placed on BiPAP and ICU, continued on ceftriaxone, azithromycin, steroids, bronchodilators.  She did improve and is currently on 4 L chronic oxygen transferred to [facility] service 12/23/2023.  Assessment and Plan: Acute on chronic hypoxic and hypercapnic respiratory failure Acute COPD exacerbation Continue supplemental oxygen to maintain saturation greater than 90%. Continue bronchodilators. Will continue to taper IV steroids. Encourage incentive spirometry, flutter valve. Out of bed to chair.  Sepsis present on admission Influenza A infection Strep pneumonia pneumonia Fever improved. Follow blood cultures. WBC trending down. Continue azithromycin and ceftriaxone therapy.  Elevated troponin Possibly type II MI due to demand ischemia. Echocardiogram showed normal EF, borderline diastolic dysfunction. She got 1 dose 40 mg IV Lasix. Monitor daily weights, strict input and output.  Hyperglycemia in the setting of steroids, acute illness. A1c 6.2. Continue Accu-Cheks, sliding scale insulin. Continue to taper steroids. Hypoglycemia protocol.  Generalized weakness Encourage out of bed to chair. PT OT evaluation   Nursing supportive care. Fall, aspiration precautions. DVT prophylaxis   Code Status: Full Code Subjective: Patient is seen and examined  today morning in ICU.  She has for BiPAP while napping, got morphine.  She is able to answer me appropriately.  States she is feeling much better.  Patient's family at bedside.   Physical Exam: Vitals:   12/23/23 1104 12/23/23 1115 12/23/23 1200 12/23/23 1234  BP: (!) 171/93  (!) 150/91   Pulse: (!) 111 (!) 104 (!) 102 (!) 102  Resp: 20 (!) 24 20 (!) 22  Temp:      TempSrc:      SpO2: 98% 98% 98% 97%  Weight:      Height:        General - Elderly ill Caucasian female, moderate respiratory distress HEENT - PERRLA, EOMI, atraumatic head, non tender sinuses. Lung - Clear, diffuse rales, basilar rhonchi, wheezes. Heart - S1, S2 heard, tachycardia, trace pedal edema. Abdomen - Soft, non tender, nondistended, bowel sounds good Neuro - Alert, awake and oriented x 3, non focal exam. Skin - Warm and dry.  Data Reviewed:      Latest Ref Rng & Units 12/23/2023    3:33 AM 12/22/2023    3:34 AM 12/21/2023    4:34 AM  CBC  WBC 4.0 - 10.5 K/uL 4.4  6.9  10.1   Hemoglobin 12.0 - 15.0 g/dL 81.1  91.4  78.2   Hematocrit 36.0 - 46.0 % 35.5  35.8  35.4   Platelets 150 - 400 K/uL 281  256  260       Latest Ref Rng & Units 12/23/2023    3:34 AM 12/22/2023    3:34 AM 12/21/2023    4:34 AM  BMP  Glucose 70 - 99 mg/dL 956  213  086   BUN 8 - 23 mg/dL 32  33  22  Creatinine 0.44 - 1.00 mg/dL 5.95  6.38  7.56   Sodium 135 - 145 mmol/L 141  143  139   Potassium 3.5 - 5.1 mmol/L 3.5  4.0  4.0   Chloride 98 - 111 mmol/L 97  98  98   CO2 22 - 32 mmol/L 35  34  31   Calcium 8.9 - 10.3 mg/dL 8.2  8.3  7.9    ECHOCARDIOGRAM COMPLETE Result Date: 12/22/2023    ECHOCARDIOGRAM REPORT   Patient Name:   Rachael Blankenship Date of Exam: 12/21/2023 Medical Rec #:  [id number]       Height:       63.0 in Accession #:    [id number]      Weight:       131.4 lb Date of Birth:  Aug 26, 1956       BSA:          1.617 m Patient Age:    67 years        BP:           115/54 mmHg Patient Gender: F               HR:            126 bpm. Exam Location:  [facility] Procedure: 2D Echo, Cardiac Doppler and Color Doppler Indications:     Elevated Troponin  History:         Patient has prior history of Echocardiogram examinations, most                  recent 09/18/2020. Pulmonary HTN and COPD, Signs/Symptoms:Chest                  Pain and Shortness of Breath; Risk Factors:Dyslipidemia and                  Current Smoker.  Sonographer:     Lucendia Herrlich RCS Referring Phys:  [id number] Rachael Blankenship Diagnosing Phys: Rachael Millard MD IMPRESSIONS  1. Left ventricular ejection fraction, by estimation, is 60 to 65%. The left ventricle has normal function. The left ventricle has no regional wall motion abnormalities. Left ventricular diastolic parameters are consistent with Grade I diastolic dysfunction (impaired relaxation).  2. Right ventricular systolic function is normal. The right ventricular size is normal.  3. The mitral valve is normal in structure. Trivial mitral valve regurgitation. No evidence of mitral stenosis.  4. The aortic valve is normal in structure. Aortic valve regurgitation is not visualized. No aortic stenosis is present.  5. The inferior vena cava is normal in size with greater than 50% respiratory variability, suggesting right atrial pressure of 3 mmHg. FINDINGS  Left Ventricle: Left ventricular ejection fraction, by estimation, is 60 to 65%. The left ventricle has normal function. The left ventricle has no regional wall motion abnormalities. The left ventricular internal cavity size was normal in size. There is  no left ventricular hypertrophy. Left ventricular diastolic parameters are consistent with Grade I diastolic dysfunction (impaired relaxation). Right Ventricle: The right ventricular size is normal. No increase in right ventricular wall thickness. Right ventricular systolic function is normal. Left Atrium: Left atrial size was normal in size. Right Atrium: Right atrial size was normal in size. Pericardium: There is  no evidence of pericardial effusion. Mitral Valve: The mitral valve is normal in structure. Trivial mitral valve regurgitation. No evidence of mitral valve stenosis. Tricuspid Valve: The tricuspid valve is normal in structure. Tricuspid valve regurgitation is trivial. No  evidence of tricuspid stenosis. Aortic Valve: The aortic valve is normal in structure. Aortic valve regurgitation is not visualized. No aortic stenosis is present. Aortic valve peak gradient measures 14.1 mmHg. Pulmonic Valve: The pulmonic valve was normal in structure. Pulmonic valve regurgitation is not visualized. No evidence of pulmonic stenosis. Aorta: The aortic root is normal in size and structure. Venous: The inferior vena cava is normal in size with greater than 50% respiratory variability, suggesting right atrial pressure of 3 mmHg. IAS/Shunts: No atrial level shunt detected by color flow Doppler.  LEFT VENTRICLE PLAX 2D LVIDd:         4.10 cm Diastology LV PW:         0.90 cm LV e' medial:    7.51 cm/s LV IVS:        0.70 cm LV E/e' medial:  12.8                        LV e' lateral:   10.90 cm/s                        LV E/e' lateral: 8.8  RIGHT VENTRICLE            IVC RV S prime:     8.86 cm/s  IVC diam: 2.30 cm TAPSE (M-mode): 1.8 cm AORTIC VALVE AV Vmax:      188.00 cm/s AV Peak Grad: 14.1 mmHg LVOT Vmax:    119.00 cm/s LVOT Vmean:   77.900 cm/s LVOT VTI:     0.185 m MITRAL VALVE MV Area (PHT): 8.25 cm     SHUNTS MV Decel Time: 92 msec      Systemic VTI: 0.18 m MV E velocity: 96.40 cm/s MV A velocity: 114.00 cm/s MV E/A ratio:  0.85 Rachael Millard MD Electronically signed by Rachael Millard MD Signature Date/Time: 12/22/2023/1:28:10 PM    Final    DG Chest Port 1 View Result Date: 12/22/2023 CLINICAL DATA:  Acute respiratory failure with hypoxia and hypercapnia. EXAM: PORTABLE CHEST 1 VIEW COMPARISON:  12/20/2023 FINDINGS: Evidence for underlying emphysema. There may be focal atelectasis at the left lung base but minimal  change. No new airspace disease or lung consolidation. Heart and mediastinum are within normal limits. Atherosclerotic calcifications at the aortic arch. Negative for a pneumothorax. No acute bone abnormality. IMPRESSION: Emphysema with possible atelectasis at the left lung base. No new lung findings. Electronically Signed   By: Richarda Overlie M.D.   On: 12/22/2023 09:51     Family Communication: Discussed with family at bedside they understand and agree. All questions answereed.    Disposition: Status is: Inpatient Remains inpatient appropriate because: Hypoxia, on BiPAP, sepsis, respiratory failure  Planned Discharge Destination: Home with Home Health     Time spent: 42 minutes  Author: Marcelino Duster, MD 12/23/2023 12:49 PM Secure chat 7am to 7pm For on call review [email or web address].

## 2023-12-23 NOTE — Progress Notes (Signed)
PHARMACY CONSULT NOTE - FOLLOW UP  Pharmacy Consult for Electrolyte Monitoring and Replacement   Recent Labs: Potassium (mmol/L)  Date Value  12/23/2023 3.5   Magnesium (mg/dL)  Date Value  96/02/5408 2.2   Calcium (mg/dL)  Date Value  81/19/1478 8.2 (L)   Albumin (g/dL)  Date Value  29/56/2130 3.0 (L)  11/21/2023 4.4   Phosphorus (mg/dL)  Date Value  86/57/8469 2.6   Sodium (mmol/L)  Date Value  12/23/2023 141  11/21/2023 141     Assessment: 68 year old female history of COPD and chronic hypoxic respiratory failure on 4 L baseline who presented to the ED with chief complaint of severe respiratory distress. Pharmacy is asked to follow and replace electrolytes while in CCU   Goal of Therapy:  Electrolytes WNL  Plan:  ---40 mEq x 1 po KCl ---recheck electrolytes in am  Lowella Bandy ,PharmD Clinical Pharmacist 12/23/2023 7:17 AM

## 2023-12-23 NOTE — Plan of Care (Signed)
  Problem: Coping: Goal: Ability to adjust to condition or change in health will improve Outcome: Progressing   Problem: Fluid Volume: Goal: Ability to maintain a balanced intake and output will improve Outcome: Progressing   Problem: Nutritional: Goal: Maintenance of adequate nutrition will improve Outcome: Progressing   Problem: Tissue Perfusion: Goal: Adequacy of tissue perfusion will improve Outcome: Progressing   Problem: Education: Goal: Knowledge of General Education information will improve Description: Including pain rating scale, medication(s)/side effects and non-pharmacologic comfort measures Outcome: Progressing   Problem: Clinical Measurements: Goal: Respiratory complications will improve Outcome: Progressing   Problem: Activity: Goal: Risk for activity intolerance will decrease Outcome: Progressing   Problem: Nutrition: Goal: Adequate nutrition will be maintained Outcome: Progressing

## 2023-12-23 NOTE — TOC CM/SW Note (Signed)
Transition of Care [facility]) - Inpatient Brief Assessment   Patient Details  Name: Rachael Blankenship MRN: [id number] Date of Birth: 1956-04-15  Transition of Care [facility]) CM/SW Contact:    Margarito Liner, LCSW Phone Number: 12/23/2023, 1:52 PM   Clinical Narrative: CSW reviewed chart. No TOC needs identified at this time. CSW will continue to follow progress. Please place [facility] consult if any needs arise.  Transition of Care Asessment: Insurance and Status: Insurance coverage has been reviewed Patient has primary care physician: Yes Home environment has been reviewed: Single family home Prior level of function:: Not documented Prior/Current Home Services: No current home services Social Drivers of Health Review: SDOH reviewed no interventions necessary Readmission risk has been reviewed: Yes Transition of care needs: no transition of care needs at this time

## 2023-12-23 NOTE — Progress Notes (Signed)
NAME:  Rachael Blankenship, MRN:  [id number], DOB:  June 13, 1956, LOS: 3 ADMISSION DATE:  12/19/2023, CONSULTATION DATE:  12/21/2023 REFERRING MD:  Dr. Georgeann Oppenheim, CHIEF COMPLAINT:  Acute on Chronic Hypoxic Respiratory Failure   Brief Pt Description / Synopsis:  68 y.o. female admitted with Acute on Chronic Hypoxic & Hypercapnic Respiratory Failure in the setting of Acute COPD Exacerbation, Influenza A Infection, and superimposed Strep Pneumoniae Pneumonia requiring BiPAP.  High risk for intubation.  History of Present Illness:  Rachael Blankenship is a 68 y.o. female with a past medical history of COPD and chronic hypoxic respiratory failure on 4 L baseline who presented to the ED with chief complaint of severe respiratory distress with associated fever and cough.  Pt currently with respiratory distress on BiPAP, therefore history is obtained from chart review.  Per H&P from Hospitalists, she had progressive cough and shortness of breath over several days.  She had minimal relief with nebulizer treatments and Azithromycin at home.  Upon EMS arrival she noted to have severe respiratory distress of which she was placed on non-rebreather mask and  given magnesium, solumedrol and nebulizers.  In group she became transiently unresponsive and required placement of BiPAP on upon arrival.  ED Course: Initial Vital Signs: Temperature 99 Fahrenheit orally, pulse 126, respiratory rate 25, blood pressure 186/111, SpO2 100% on nonrebreather Significant Labs: Glucose 273, BNP 49, high-sensitivity troponin peaked at 582, lactic acid 0.9, WBC 16.0, procalcitonin 1.08 Positive for influenza A Strep pneumo urinary antigen positive Imaging Chest X-ray>>IMPRESSION: 1. No active disease. 2. Aortic Atherosclerosis    She was admitted by the hospitalist for further workup and treatment.  Please see significant hospital events section below for full detailed hospital course.  12/22/23- for TRH today, reduced phlegm, off bipap  today.  Procalcitonin is improved. CBC stable , bmp with electrolyte derrangements with pharmacy consultation for repletion.  12/23/23- patient rquired bipap overnight with lasix.  She still has labored breathing. Reports air hunger did improve with dose of narcotics overnight. Diet has been started.  Will continue to follow from pulmonary perspective.   Pertinent  Medical History   Past Medical History:  Diagnosis Date   Chronic respiratory failure with hypoxia (HCC)    Cigarette smoker    COPD (chronic obstructive pulmonary disease) (HCC)    Dependence on supplemental oxygen    Depression with anxiety    Endometriosis    GERD (gastroesophageal reflux disease)    Grade I diastolic dysfunction    Hyperlipidemia    Kidney stones    Reactive airway disease 03/08/2014    Micro Data:  1/24: COVID/RSV/FLU PCR>>  + Influenza A 1/24: Blood culture x2>> no growth to date 1/25: Strep Pneumo urinary antigen + 1/25: HIV Screen>> nonreactive  Antimicrobials:   Anti-infectives (From admission, onward)    Start     Dose/Rate Route Frequency Ordered Stop   12/21/23 1000  azithromycin (ZITHROMAX) 500 mg in sodium chloride 0.9 % 250 mL IVPB        500 mg 250 mL/hr over 60 Minutes Intravenous Every 24 hours 12/20/23 1431 12/24/23 0959   12/21/23 0900  cefTRIAXone (ROCEPHIN) 2 g in sodium chloride 0.9 % 100 mL IVPB        2 g 200 mL/hr over 30 Minutes Intravenous Every 24 hours 12/20/23 1431 12/26/23 0859   12/20/23 1000  oseltamivir (TAMIFLU) capsule 30 mg        30 mg Oral 2 times daily 12/20/23 0911 12/25/23 0959  12/20/23 0900  cefTRIAXone (ROCEPHIN) 1 g in sodium chloride 0.9 % 100 mL IVPB  Status:  Discontinued        1 g 200 mL/hr over 30 Minutes Intravenous Every 24 hours 12/20/23 0748 12/20/23 1431   12/20/23 0900  azithromycin (ZITHROMAX) 500 mg in sodium chloride 0.9 % 250 mL IVPB  Status:  Discontinued        500 mg 250 mL/hr over 60 Minutes Intravenous Every 24 hours 12/20/23  0748 12/20/23 1431       Significant Hospital Events: Including procedures, antibiotic start and stop dates in addition to other pertinent events   1/25: Admitted by [facility] with COPD Exacerbation, Influenza infection, and Strep Pneumo pneumonia.  Requiring BiPAP 1/26: Remains BiPAP dependent with increased WOB.  High risk for intubation.  PCCM consulted.    Objective   Blood pressure (!) 165/97, pulse (!) 111, temperature 98.2 F (36.8 C), temperature source Axillary, resp. rate (!) 23, height 5\' 3"  (1.6 m), weight 61 kg, SpO2 93%.    FiO2 (%):  [40 %] 40 %   Intake/Output Summary (Last 24 hours) at 12/23/2023 1035 Last data filed at 12/23/2023 0600 Gross per 24 hour  Intake 733.26 ml  Output 1200 ml  Net -466.74 ml   Filed Weights   12/21/23 1135 12/23/23 0500  Weight: 59.6 kg 61 kg    Examination: General: Acute on chronically ill-appearing female, NAD HENT: Atraumatic, normocephalic, neck supple, no JVD Lungs: mild rhonchi b/l Cardiovascular: Tachycardia, regular rhythm, S1-S2, no murmurs, rubs, gallops Abdomen: Soft, nontender, nondistended, no guarding or rebound tenderness, bowel sounds positive x 4 Extremities: Normal bulk and tone, no deformities, no edema, no cyanosis, good peripheral perfusion Neuro: Aaox 2 follows simple commands, no focal deficits noted, pupils PERRLA GU: Deferred  Resolved Hospital Problem list     Assessment & Plan:   #Acute on Chronic Hypoxic & Hypercapnic  Respiratory Failure in the setting of Acute COPD Exacerbation, Influenza A Infection, and superimposed Strep Pneumoniae Pneumonia PMHx: COPD requiring 4L supplemental O2 at baseline, tobacco abuse -Supplemental O2 as needed to maintain O2 sats 88 to 92% -Bronchodilators -IV Steroids- reduced dose -Pulmonary toilet as able  #Sepsis (-PRESENT ON ADMISSION DUE TO PNEUMONIA #Influenza A Infection #Superimposed Strep Pneumoniae Bacterial Pneumonia -Monitor fever curve -Trend WBC's &  Procalcitonin -Follow cultures as above -Continue empiric Azithromycin and Ceftriaxone pending cultures & sensitivities  #Elevated Troponin, suspect demand ischemia #Tachycardia, suspect compensatory due to respiratory failure Echocardiogram 09/18/20: Normal LVEF, borderline diastolic dysfunction, normal valves -Continuous cardiac monitoring -Maintain MAP >65 -Vasopressors as needed to maintain MAP goal ~ NOT REQUIRING -Lactic acid is normalized -HS Troponin peaked at 502 -BNP 49 on 1/24 -Echocardiogram ordered and pending -Diuresis as BP and renal function permits ~ given 40 mg IV Lasix x1 dose on 1/26  #Hyperglycemia, suspect in setting of acute critical illness and steroids -Check Hgb A1c -CBG's q4h; Target range of 140 to 180 -SSI -Follow ICU Hypo/Hyperglycemia protocol      Best Practice (right click and "Reselect all SmartList Selections" daily)   Diet/type: NPO until respiratory status improved DVT prophylaxis: LMWH GI prophylaxis: PPI Lines: N/A Foley:  N/A Code Status:  full code Last date of multidisciplinary goals of care discussion [1/26]   Labs   CBC: Recent Labs  Lab 12/19/23 2336 12/20/23 1038 12/21/23 0434 12/22/23 0334 12/23/23 0333  WBC 16.0* 10.9* 10.1 6.9 4.4  NEUTROABS 12.2*  --   --   --   --  HGB 13.5 12.5 11.3* 11.3* 11.4*  HCT 42.3 39.3 35.4* 35.8* 35.5*  MCV 85.8 86.4 86.3 88.2 84.7  PLT 374 268 260 256 281    Basic Metabolic Panel: Recent Labs  Lab 12/19/23 2336 12/20/23 1038 12/21/23 0434 12/22/23 0334 12/22/23 1709 12/23/23 0333 12/23/23 0334  NA 136 139 139 143  --   --  141  K 4.2 4.2 4.0 4.0  --   --  3.5  CL 93* 99 98 98  --   --  97*  CO2 30 28 31  34*  --   --  35*  GLUCOSE 273* 171* 122* 133*  --   --  147*  BUN 11 14 22  33*  --   --  32*  CREATININE 0.53 0.41*  0.40* 0.40* 0.50  --   --  0.39*  CALCIUM 8.8* 8.1* 7.9* 8.3*  --   --  8.2*  MG  --   --   --  2.4  --  2.2  --   PHOS  --   --   --  1.8* 4.3  --   2.6   GFR: Estimated Creatinine Clearance (by C-G formula based on SCr of 0.39 mg/dL (L)) Female: 40.9 mL/min (A) Female: 70.1 mL/min (A) Recent Labs  Lab 12/19/23 2336 12/20/23 0715 12/20/23 1038 12/20/23 1316 12/21/23 0434 12/22/23 0334 12/23/23 0333  PROCALCITON  --   --  1.08  --   --  0.63 0.31  WBC 16.0*  --  10.9*  --  10.1 6.9 4.4  LATICACIDVEN 0.9 1.3 1.2 0.6  --   --   --     Liver Function Tests: Recent Labs  Lab 12/19/23 2336 12/20/23 1038 12/21/23 0434 12/22/23 0334 12/23/23 0334  AST 29 36 49*  --   --   ALT 23 23 35  --   --   ALKPHOS 97 76 75  --   --   BILITOT 0.6 0.5 0.6  --   --   PROT 7.8 6.9 6.5  --   --   ALBUMIN 4.1 3.5 3.1* 3.1* 3.0*   No results for input(s): "LIPASE", "AMYLASE" in the last 168 hours. No results for input(s): "AMMONIA" in the last 168 hours.  ABG    Component Value Date/Time   HCO3 29.4 (H) 12/20/2023 0159   O2SAT 100 12/20/2023 0159     Coagulation Profile: No results for input(s): "INR", "PROTIME" in the last 168 hours.  Cardiac Enzymes: No results for input(s): "CKTOTAL", "CKMB", "CKMBINDEX", "TROPONINI" in the last 168 hours.  HbA1C: Hemoglobin A1C  Date/Time Value Ref Range Status  12/11/2023 04:01 PM 6.2 (A) 4.0 - 5.6 % Final   Hgb A1c MFr Bld  Date/Time Value Ref Range Status  05/22/2018 12:02 AM 6.2 (H) 4.8 - 5.6 % Final    Comment:    (NOTE) Pre diabetes:          5.7%-6.4% Diabetes:              >6.4% Glycemic control for   <7.0% adults with diabetes     CBG: Recent Labs  Lab 12/22/23 0810 12/22/23 1207 12/22/23 1653 12/22/23 2134 12/23/23 0724  GLUCAP 109* 135* 168* 164* 128*    Review of Systems:   Positives in BOLD: Gen: Denies fever, chills, weight change, fatigue, night sweats HEENT: Denies blurred vision, double vision, hearing loss, tinnitus, sinus congestion, rhinorrhea, sore throat, neck stiffness, dysphagia PULM: Denies shortness of breath, cough, sputum production,  hemoptysis, wheezing CV: Denies chest pain, edema, orthopnea, paroxysmal nocturnal dyspnea, palpitations GI: Denies abdominal pain, nausea, vomiting, diarrhea, hematochezia, melena, constipation, change in bowel habits GU: Denies dysuria, hematuria, polyuria, oliguria, urethral discharge Endocrine: Denies hot or cold intolerance, polyuria, polyphagia or appetite change Derm: Denies rash, dry skin, scaling or peeling skin change Heme: Denies easy bruising, bleeding, bleeding gums Neuro: Denies headache, numbness, weakness, slurred speech, loss of memory or consciousness   Past Medical History:  She,  has a past medical history of Chronic respiratory failure with hypoxia (HCC), Cigarette smoker, COPD (chronic obstructive pulmonary disease) (HCC), Dependence on supplemental oxygen, Depression with anxiety, Endometriosis, GERD (gastroesophageal reflux disease), Grade I diastolic dysfunction, Hyperlipidemia, Kidney stones, and Reactive airway disease (03/08/2014).   Surgical History:   Past Surgical History:  Procedure Laterality Date   CATARACT EXTRACTION W/PHACO Right 12/04/2023   Procedure: CATARACT EXTRACTION PHACO AND INTRAOCULAR LENS PLACEMENT (IOC) RIGHT  CLAREON VIVITY LENS;  Surgeon: Estanislado Pandy, MD;  Location: [facility];  Service: Ophthalmology;  Laterality: Right;  10.40 1:04.0   CATARACT EXTRACTION W/PHACO Left 12/18/2023   Procedure: CATARACT EXTRACTION PHACO AND INTRAOCULAR LENS PLACEMENT (IOC) LEFT  CLAREON VIVITY TORIC 16.28, 01:19.2;  Surgeon: Estanislado Pandy, MD;  Location: [facility];  Service: Ophthalmology;  Laterality: Left;   COLONOSCOPY WITH PROPOFOL N/A 01/02/2023   Procedure: COLONOSCOPY WITH PROPOFOL;  Surgeon: Toney Reil, MD;  Location: [facility] END[facility]COPY;  Service: Gastroenterology;  Laterality: N/A;   ESOPHAGOGASTRODUODENOSCOPY N/A 07/29/2018   Procedure: ESOPHAGOGASTRODUODENOSCOPY (EGD);  Surgeon: Toledo, Boykin Nearing, MD;  Location:  [facility] ENDOSCOPY;  Service: Gastroenterology;  Laterality: N/A;   ESOPHAGOGASTRODUODENOSCOPY (EGD) WITH PROPOFOL N/A 01/02/2023   Procedure: ESOPHAGOGASTRODUODENOSCOPY (EGD) WITH PROPOFOL;  Surgeon: Toney Reil, MD;  Location: [facility] ENDOSCOPY;  Service: Gastroenterology;  Laterality: N/A;   KIDNEY STONE SURGERY     TONSILLECTOMY       Social History:   reports that she has been smoking cigarettes. She started smoking about 50 years ago. She has a 26.8 pack-year smoking history. She has never used smokeless tobacco. She reports current alcohol use. She reports that she does not use drugs.   Family History:  Her family history includes GER disease in her father and mother; Heart attack in her father; Hyperlipidemia in her mother; Hypertension in her father and mother; Parkinson's disease in her father.   Allergies Allergies  Allergen Reactions   Naproxen Itching   Sulfa Antibiotics Hives and Rash     Home Medications  Prior to Admission medications   Medication Sig Start Date End Date Taking? Authorizing Provider  ALPRAZolam (XANAX) 0.25 MG tablet Take 1 tablet (0.25 mg total) by mouth 3 (three) times daily as needed for anxiety. 12/11/23  Yes Abernathy, Arlyss Repress, NP  ascorbic acid (VITAMIN C) 500 MG tablet Take 500 mg by mouth daily.   Yes [provider]  aspirin 81 MG tablet Take 81 mg by mouth at bedtime.    Yes [provider]  azithromycin (ZITHROMAX) 250 MG tablet Use as directed for 5 days. 12/19/23  Yes McDonough, Salomon Fick, PA-C  Cholecalciferol 25 MCG (1000 UT) tablet Take 1,000 Units by mouth daily.    Yes [provider]  Coenzyme Q10 (CO Q-10) 100 MG CAPS Take 200 mg by mouth daily.    Yes [provider]  DULERA 200-5 MCG/ACT AERO INHALE 1 PUFF INTO THE LUNGS TWICE DAILY 05/23/23  Yes Abernathy, Alyssa, NP  DULoxetine (CYMBALTA) 60 MG capsule Take  1 capsule (60 mg total) by mouth daily. 11/02/23  Yes Lyndon Code, MD  famotidine (PEPCID) 40  MG tablet Take 1 tablet (40 mg total) by mouth 2 (two) times daily. 07/02/23  Yes Abernathy, Arlyss Repress, NP  furosemide (LASIX) 40 MG tablet TAKE 1 TABLET BY MOUTH DAILY 02/24/23  Yes Abernathy, Alyssa, NP  gemfibrozil (LOPID) 600 MG tablet Take 1 tablet (600 mg total) by mouth 2 (two) times daily before a meal. 12/11/23  Yes Abernathy, Alyssa, NP  Ipratropium-Albuterol (COMBIVENT RESPIMAT) 20-100 MCG/ACT AERS respimat INHALE 1 PUFF INTO THE LUNGS EVERY 6 HOURS AS NEEDED FOR WHEEZING OR SHORTNESS OF BREATH 10/31/23  Yes Lyndon Code, MD  Magnesium 400 MG TABS Take 400 mg by mouth daily.    Yes [provider]  mometasone (NASONEX) 50 MCG/ACT nasal spray Place 2 sprays into the nose daily.   Yes [provider]  montelukast (SINGULAIR) 10 MG tablet Take 1 tablet (10 mg total) by mouth daily. 01/31/23  Yes Abernathy, Arlyss Repress, NP  Multiple Vitamins-Minerals (MULTIVITAMIN WITH MINERALS) tablet Take 1 tablet by mouth daily.   Yes [provider]  omeprazole (PRILOSEC) 40 MG capsule Take 1 capsule (40 mg total) by mouth 2 (two) times daily. 12/04/23  Yes Abernathy, Arlyss Repress, NP  Potassium Chloride CR (MICRO-K) 8 MEQ CPCR capsule CR Take 1 capsule (8 mEq total) by mouth daily. 10/26/23  Yes Lyndon Code, MD  revefenacin [facility]) 175 MCG/3ML nebulizer solution USE 1 VIAL IN NEBULIZER DAILY 08/11/23  Yes Abernathy, Alyssa, NP  Roflumilast 250 MCG TABS TAKE 1 TABLET BY MOUTH EVERY DAY 11/27/23  Yes Abernathy, Alyssa, NP  simvastatin (ZOCOR) 20 MG tablet TAKE 1 TABLET(20 MG) BY MOUTH EVERY EVENING FOR CHOLESTEROL 11/06/23  Yes Abernathy, Alyssa, NP  SPIRIVA HANDIHALER 18 MCG inhalation capsule Place 1 capsule (18 mcg total) into inhaler and inhale daily. i 12/09/23  Yes Abernathy, Arlyss Repress, NP  vitamin E 180 MG (400 UNITS) capsule Take by mouth.   Yes [provider]  DENTA 5000 PLUS 1.1 % CREA dental cream Take 1 application by mouth daily. 04/14/21   [provider]  OXYGEN Inhale 4 L  into the lungs continuous. Continuous oxygen and NIV at night, 2 liters   pt uses [facility] for oxygen supplies    [provider]  Varenicline Tartrate, Starter, 0.5 MG X 11 & 1 MG X 42 TBPK Take 0.5 mg by mouth once daily on days 1-3, then take 0.5 mg twice daily on days 4-7, then increase to 1 mg twice daily Patient not taking: Reported on 12/20/2023 08/13/23   Sallyanne Kuster, NP     Critical care provider statement:   Total critical care time: 34 minutes   Performed by: Karna Christmas MD   Critical care time was exclusive of separately billable procedures and treating other patients.   Critical care was necessary to treat or prevent imminent or life-threatening deterioration.   Critical care was time spent personally by me on the following activities: development of treatment plan with patient and/or surrogate as well as nursing, discussions with consultants, evaluation of patient's response to treatment, examination of patient, obtaining history from patient or surrogate, ordering and performing treatments and interventions, ordering and review of laboratory studies, ordering and review of radiographic studies, pulse oximetry and re-evaluation of patient's condition.    Vida Rigger, M.D.  Pulmonary & Critical Care Medicine

## 2023-12-24 DIAGNOSIS — J1100 Influenza due to unidentified influenza virus with unspecified type of pneumonia: Secondary | ICD-10-CM | POA: Diagnosis not present

## 2023-12-24 DIAGNOSIS — J9601 Acute respiratory failure with hypoxia: Secondary | ICD-10-CM | POA: Diagnosis not present

## 2023-12-24 DIAGNOSIS — A491 Streptococcal infection, unspecified site: Secondary | ICD-10-CM | POA: Diagnosis not present

## 2023-12-24 DIAGNOSIS — J441 Chronic obstructive pulmonary disease with (acute) exacerbation: Secondary | ICD-10-CM | POA: Diagnosis not present

## 2023-12-24 LAB — CULTURE, BLOOD (ROUTINE X 2): Culture: NO GROWTH

## 2023-12-24 LAB — CBC
HCT: 36.6 % (ref 36.0–46.0)
Hemoglobin: 11.5 g/dL — ABNORMAL LOW (ref 12.0–15.0)
MCH: 27.1 pg (ref 26.0–34.0)
MCHC: 31.4 g/dL (ref 30.0–36.0)
MCV: 86.3 fL (ref 80.0–100.0)
Platelets: 297 10*3/uL (ref 150–400)
RBC: 4.24 MIL/uL (ref 3.87–5.11)
RDW: 14.2 % (ref 11.5–15.5)
WBC: 5.6 10*3/uL (ref 4.0–10.5)
nRBC: 0 % (ref 0.0–0.2)

## 2023-12-24 LAB — GLUCOSE, CAPILLARY
Glucose-Capillary: 131 mg/dL — ABNORMAL HIGH (ref 70–99)
Glucose-Capillary: 141 mg/dL — ABNORMAL HIGH (ref 70–99)
Glucose-Capillary: 164 mg/dL — ABNORMAL HIGH (ref 70–99)
Glucose-Capillary: 132 mg/dL — ABNORMAL HIGH (ref 70–99)

## 2023-12-24 LAB — RENAL FUNCTION PANEL
Albumin: 2.8 g/dL — ABNORMAL LOW (ref 3.5–5.0)
Anion gap: 9 (ref 5–15)
BUN: 29 mg/dL — ABNORMAL HIGH (ref 8–23)
CO2: 36 mmol/L — ABNORMAL HIGH (ref 22–32)
Calcium: 8.9 mg/dL (ref 8.9–10.3)
Chloride: 95 mmol/L — ABNORMAL LOW (ref 98–111)
Creatinine, Ser: 0.31 mg/dL — ABNORMAL LOW (ref 0.44–1.00)
GFR, Estimated: >60 mL/min (ref >60)
Glucose, Bld: 139 mg/dL — ABNORMAL HIGH (ref 70–99)
Phosphorus: 1.9 mg/dL — ABNORMAL LOW (ref 2.5–4.6)
Potassium: 4.4 mmol/L (ref 3.5–5.1)
Sodium: 140 mmol/L (ref 135–145)

## 2023-12-24 LAB — MAGNESIUM: Magnesium: 2 mg/dL (ref 1.7–2.4)

## 2023-12-24 MED ORDER — AZITHROMYCIN 250 MG PO TABS
250.0000 mg | ORAL_TABLET | Freq: Every day | ORAL | Status: DC
  Administered 2023-12-24 – 2023-12-26 (×3): 250 mg via ORAL
  Filled 2023-12-24 (×3): qty 1

## 2023-12-24 MED ORDER — PREDNISONE 50 MG PO TABS
50.0000 mg | ORAL_TABLET | Freq: Every day | ORAL | Status: DC
  Administered 2023-12-25 – 2023-12-26 (×2): 50 mg via ORAL
  Filled 2023-12-24 (×2): qty 5

## 2023-12-24 MED ORDER — ALPRAZOLAM 0.5 MG PO TABS
0.5000 mg | ORAL_TABLET | Freq: Once | ORAL | Status: AC
  Administered 2023-12-24: 0.5 mg via ORAL
  Filled 2023-12-24: qty 1

## 2023-12-24 MED ORDER — METOPROLOL TARTRATE 5 MG/5ML IV SOLN
5.0000 mg | Freq: Once | INTRAVENOUS | Status: AC
  Administered 2023-12-24: 5 mg via INTRAVENOUS
  Filled 2023-12-24: qty 5

## 2023-12-24 MED ORDER — ALUM & MAG HYDROXIDE-SIMETH 200-200-20 MG/5ML PO SUSP
30.0000 mL | Freq: Once | ORAL | Status: AC
  Administered 2023-12-24: 30 mL via ORAL
  Filled 2023-12-24: qty 30

## 2023-12-24 MED ORDER — ALPRAZOLAM 0.5 MG PO TABS
0.5000 mg | ORAL_TABLET | Freq: Three times a day (TID) | ORAL | Status: DC | PRN
  Administered 2023-12-24 – 2023-12-26 (×3): 0.5 mg via ORAL
  Filled 2023-12-24 (×3): qty 1

## 2023-12-24 MED ORDER — METOPROLOL TARTRATE 25 MG PO TABS
25.0000 mg | ORAL_TABLET | Freq: Two times a day (BID) | ORAL | Status: DC
  Administered 2023-12-24 – 2023-12-26 (×4): 25 mg via ORAL
  Filled 2023-12-24 (×4): qty 1

## 2023-12-24 NOTE — Progress Notes (Signed)
Progress Note   Patient: Rachael Blankenship ZOX:[id number] DOB: Jan 11, 1956 DOA: 12/19/2023     4 DOS: the patient was seen and examined on 12/24/2023   Brief hospital course: Milaina Sher is a 68 y.o. female with a past medical history of COPD and chronic hypoxic respiratory failure on 4 L baseline who presented to the ED with chief complaint of severe respiratory distress with associated fever and cough.  She is initially admitted to [facility] service and later transferred to ICU as she is requiring BiPAP and is at high risk for intubation.  Patient tested positive for influenza A infection and superimposed strep pneumonia pneumonia.  Patient was placed on BiPAP and ICU, continued on ceftriaxone, azithromycin, steroids, bronchodilators.  She did improve and is currently on 4 L chronic oxygen transferred to [facility] service 12/23/2023.  Assessment and Plan: Acute on chronic hypoxic and hypercapnic respiratory failure Acute COPD exacerbation Continue supplemental oxygen to maintain saturation greater than 90%. Continue bronchodilators. Bipap while sleeping and PRN. Will continue to taper IV steroids. Encourage incentive spirometry, flutter valve. Out of bed to chair. Transferred to progressive care unit.  Sepsis present on admission Influenza A infection Strep pneumonia pneumonia Fever improved. Follow blood cultures. Continue Tamiflu for 5 days. Continue azithromycin and ceftriaxone therapy.  Elevated troponin Possibly type II MI due to demand ischemia. Echocardiogram showed normal EF, borderline diastolic dysfunction. She got 1 dose 40 mg IV Lasix. Monitor daily weights, strict input and output.  Hyperglycemia in the setting of steroids, acute illness. A1c 6.2. Continue Accu-Cheks, sliding scale insulin. Continue to taper steroids. Hypoglycemia protocol.  Generalized weakness Encourage out of bed to chair. PT OT evaluation  Encourage out of bed to chair, incentive spirometry. Nursing  supportive care. Fall, aspiration precautions. DVT prophylaxis   Code Status: Full Code  Subjective: Patient is seen and examined today morning. She is feeling better, on 4L supplemental O2. Able to answer me appropriately.  RN noted anxiety and on and off pleuritic chest discomfort. Tachycardia noted. No family at bedside. Eating fair. Advised out of bed to chair.   Physical Exam: Vitals:   12/24/23 1100 12/24/23 1200 12/24/23 1219 12/24/23 1230  BP: (!) 163/90 (!) 168/89 (!) 168/89 (!) 176/86  Pulse: (!) 118 (!) 118  (!) 129  Resp: (!) 23 (!) 26  20  Temp:  98 F (36.7 C)    TempSrc:  Axillary    SpO2: 94% 95%  93%  Weight:      Height:        General - Elderly ill Caucasian female, mild respiratory distress HEENT - PERRLA, EOMI, atraumatic head, non tender sinuses. Lung - Clear, diffuse rales, basilar rhonchi, wheezes. Heart - S1, S2 heard, tachycardia, trace pedal edema. Abdomen - Soft, non tender, nondistended, bowel sounds good Neuro - Alert, awake and oriented x 3, non focal exam. Skin - Warm and dry.  Data Reviewed:      Latest Ref Rng & Units 12/24/2023    4:00 AM 12/23/2023    3:33 AM 12/22/2023    3:34 AM  CBC  WBC 4.0 - 10.5 K/uL 5.6  4.4  6.9   Hemoglobin 12.0 - 15.0 g/dL 81.1  91.4  78.2   Hematocrit 36.0 - 46.0 % 36.6  35.5  35.8   Platelets 150 - 400 K/uL 297  281  256       Latest Ref Rng & Units 12/24/2023    4:00 AM 12/23/2023    3:34 AM 12/22/2023  3:34 AM  BMP  Glucose 70 - 99 mg/dL 454  098  119   BUN 8 - 23 mg/dL 29  32  33   Creatinine 0.44 - 1.00 mg/dL 1.47  8.29  5.62   Sodium 135 - 145 mmol/L 140  141  143   Potassium 3.5 - 5.1 mmol/L 4.4  3.5  4.0   Chloride 98 - 111 mmol/L 95  97  98   CO2 22 - 32 mmol/L 36  35  34   Calcium 8.9 - 10.3 mg/dL 8.9  8.2  8.3    No results found.  Family Communication: Discussed with family at bedside they understand and agree. All questions answereed.  Disposition: Status is: Inpatient Remains  inpatient appropriate because: Hypoxia, on BiPAP, sepsis, respiratory failure  Planned Discharge Destination: Home with Home Health     Time spent: 40 minutes  Author: Marcelino Duster, MD 12/24/2023 12:46 PM Secure chat 7am to 7pm For on call review [email or web address].

## 2023-12-24 NOTE — Progress Notes (Signed)
NAME:  Rachael Blankenship, MRN:  [id number], DOB:  1956-01-30, LOS: 4 ADMISSION DATE:  12/19/2023, CONSULTATION DATE:  12/21/2023 REFERRING MD:  Dr. Georgeann Oppenheim, CHIEF COMPLAINT:  Acute on Chronic Hypoxic Respiratory Failure   Brief Pt Description / Synopsis:  68 y.o. female admitted with Acute on Chronic Hypoxic & Hypercapnic Respiratory Failure in the setting of Acute COPD Exacerbation, Influenza A Infection, and superimposed Strep Pneumoniae Pneumonia requiring BiPAP.  High risk for intubation.  History of Present Illness:  Rachael Blankenship is a 68 y.o. female with a past medical history of COPD and chronic hypoxic respiratory failure on 4 L baseline who presented to the ED with chief complaint of severe respiratory distress with associated fever and cough.  Pt currently with respiratory distress on BiPAP, therefore history is obtained from chart review.  Per H&P from Hospitalists, she had progressive cough and shortness of breath over several days.  She had minimal relief with nebulizer treatments and Azithromycin at home.  Upon EMS arrival she noted to have severe respiratory distress of which she was placed on non-rebreather mask and  given magnesium, solumedrol and nebulizers.  In group she became transiently unresponsive and required placement of BiPAP on upon arrival.  ED Course: Initial Vital Signs: Temperature 99 Fahrenheit orally, pulse 126, respiratory rate 25, blood pressure 186/111, SpO2 100% on nonrebreather Significant Labs: Glucose 273, BNP 49, high-sensitivity troponin peaked at 582, lactic acid 0.9, WBC 16.0, procalcitonin 1.08 Positive for influenza A Strep pneumo urinary antigen positive Imaging Chest X-ray>>IMPRESSION: 1. No active disease. 2. Aortic Atherosclerosis    She was admitted by the hospitalist for further workup and treatment.  Please see significant hospital events section below for full detailed hospital course.  12/22/23- for TRH today, reduced phlegm, off bipap  today.  Procalcitonin is improved. CBC stable , bmp with electrolyte derrangements with pharmacy consultation for repletion.  12/23/23- patient rquired bipap overnight with lasix.  She still has labored breathing. Reports air hunger did improve with dose of narcotics overnight. Diet has been started.  Will continue to follow from pulmonary perspective.  12/24/23- Patient slowly improving, she reports GERD related chest discomfort, her breathing is more stable.  She reports panic attacks and asks for extra dose of xanax to be delivered.   Pertinent  Medical History   Past Medical History:  Diagnosis Date   Chronic respiratory failure with hypoxia (HCC)    Cigarette smoker    COPD (chronic obstructive pulmonary disease) (HCC)    Dependence on supplemental oxygen    Depression with anxiety    Endometriosis    GERD (gastroesophageal reflux disease)    Grade I diastolic dysfunction    Hyperlipidemia    Kidney stones    Reactive airway disease 03/08/2014    Micro Data:  1/24: COVID/RSV/FLU PCR>>  + Influenza A 1/24: Blood culture x2>> no growth to date 1/25: Strep Pneumo urinary antigen + 1/25: HIV Screen>> nonreactive  Antimicrobials:   Anti-infectives (From admission, onward)    Start     Dose/Rate Route Frequency Ordered Stop   12/21/23 1000  azithromycin (ZITHROMAX) 500 mg in sodium chloride 0.9 % 250 mL IVPB        500 mg 250 mL/hr over 60 Minutes Intravenous Every 24 hours 12/20/23 1431 12/23/23 1159   12/21/23 0900  cefTRIAXone (ROCEPHIN) 2 g in sodium chloride 0.9 % 100 mL IVPB        2 g 200 mL/hr over 30 Minutes Intravenous Every 24 hours 12/20/23 1431  12/26/23 0859   12/20/23 1000  oseltamivir (TAMIFLU) capsule 30 mg        30 mg Oral 2 times daily 12/20/23 0911 12/25/23 0959   12/20/23 0900  cefTRIAXone (ROCEPHIN) 1 g in sodium chloride 0.9 % 100 mL IVPB  Status:  Discontinued        1 g 200 mL/hr over 30 Minutes Intravenous Every 24 hours 12/20/23 0748 12/20/23 1431    12/20/23 0900  azithromycin (ZITHROMAX) 500 mg in sodium chloride 0.9 % 250 mL IVPB  Status:  Discontinued        500 mg 250 mL/hr over 60 Minutes Intravenous Every 24 hours 12/20/23 0748 12/20/23 1431       Significant Hospital Events: Including procedures, antibiotic start and stop dates in addition to other pertinent events   1/25: Admitted by [facility] with COPD Exacerbation, Influenza infection, and Strep Pneumo pneumonia.  Requiring BiPAP 1/26: Remains BiPAP dependent with increased WOB.  High risk for intubation.  PCCM consulted.    Objective   Blood pressure (!) 141/67, pulse (!) 128, temperature 97.9 F (36.6 C), temperature source Oral, resp. rate (!) 27, height 5\' 3"  (1.6 m), weight 60.6 kg, SpO2 93%.    FiO2 (%):  [40 %] 40 %   Intake/Output Summary (Last 24 hours) at 12/24/2023 0830 Last data filed at 12/24/2023 0500 Gross per 24 hour  Intake 1310 ml  Output 600 ml  Net 710 ml   Filed Weights   12/21/23 1135 12/23/23 0500 12/24/23 0400  Weight: 59.6 kg 61 kg 60.6 kg    Examination: General: Age appropraite NAD HENT: Atraumatic, normocephalic, neck supple, no JVD Lungs: mild rhonchi b/l Cardiovascular: Tachycardia, regular rhythm, S1-S2, no murmurs, rubs, gallops Abdomen: Soft, nontender, nondistended, no guarding or rebound tenderness, bowel sounds positive x 4 Extremities: Normal bulk and tone, no deformities, no edema, no cyanosis, good peripheral perfusion Neuro: Aaox 3 + anxiety no FND grossly GU: Deferred  Resolved Hospital Problem list     Assessment & Plan:   #Acute on Chronic Hypoxic & Hypercapnic  Respiratory Failure in the setting of Acute COPD Exacerbation, Influenza A Infection, and superimposed Strep Pneumoniae Pneumonia PMHx: COPD requiring 4L supplemental O2 at baseline, tobacco abuse -Supplemental O2 as needed to maintain O2 sats 88 to 92% -Bronchodilators -IV Steroids- reduced dose to prednisone taper -Pulmonary toilet as able  #Sepsis  (-PRESENT ON ADMISSION DUE TO PNEUMONIA #Influenza A Infection   #Elevated Troponin, suspect demand ischemia #Tachycardia, suspect compensatory due to respiratory failure Echocardiogram 09/18/20: Normal LVEF, borderline diastolic dysfunction, normal valves -Continuous cardiac monitoring -Maintain MAP >65 -Vasopressors as needed to maintain MAP goal ~ NOT REQUIRING -Lactic acid is normalized -HS Troponin peaked at 502 -BNP 49 on 1/24 -Echocardiogram ordered and pending -Diuresis as BP and renal function permits ~ given 40 mg IV Lasix x1 dose on 1/26  #Hyperglycemia, suspect in setting of acute critical illness and steroids -Check Hgb A1c -CBG's q4h; Target range of 140 to 180 -SSI -Follow ICU Hypo/Hyperglycemia protocol      Best Practice (right click and "Reselect all SmartList Selections" daily)   Diet/type: NPO until respiratory status improved DVT prophylaxis: LMWH GI prophylaxis: PPI Lines: N/A Foley:  N/A Code Status:  full code Last date of multidisciplinary goals of care discussion [1/26]   Labs   CBC: Recent Labs  Lab 12/19/23 2336 12/20/23 1038 12/21/23 0434 12/22/23 0334 12/23/23 0333 12/24/23 0400  WBC 16.0* 10.9* 10.1 6.9 4.4 5.6  NEUTROABS 12.2*  --   --   --   --   --  HGB 13.5 12.5 11.3* 11.3* 11.4* 11.5*  HCT 42.3 39.3 35.4* 35.8* 35.5* 36.6  MCV 85.8 86.4 86.3 88.2 84.7 86.3  PLT 374 268 260 256 281 297    Basic Metabolic Panel: Recent Labs  Lab 12/20/23 1038 12/21/23 0434 12/22/23 0334 12/22/23 1709 12/23/23 0333 12/23/23 0334 12/24/23 0400  NA 139 139 143  --   --  141 140  K 4.2 4.0 4.0  --   --  3.5 4.4  CL 99 98 98  --   --  97* 95*  CO2 28 31 34*  --   --  35* 36*  GLUCOSE 171* 122* 133*  --   --  147* 139*  BUN 14 22 33*  --   --  32* 29*  CREATININE 0.41*  0.40* 0.40* 0.50  --   --  0.39* 0.31*  CALCIUM 8.1* 7.9* 8.3*  --   --  8.2* 8.9  MG  --   --  2.4  --  2.2  --  2.0  PHOS  --   --  1.8* 4.3  --  2.6 1.9*    GFR: Estimated Creatinine Clearance (by C-G formula based on SCr of 0.31 mg/dL (L)) Female: 09.8 mL/min (A) Female: 70.1 mL/min (A) Recent Labs  Lab 12/19/23 2336 12/20/23 0715 12/20/23 1038 12/20/23 1316 12/21/23 0434 12/22/23 0334 12/23/23 0333 12/24/23 0400  PROCALCITON  --   --  1.08  --   --  0.63 0.31  --   WBC 16.0*  --  10.9*  --  10.1 6.9 4.4 5.6  LATICACIDVEN 0.9 1.3 1.2 0.6  --   --   --   --     Liver Function Tests: Recent Labs  Lab 12/19/23 2336 12/20/23 1038 12/21/23 0434 12/22/23 0334 12/23/23 0334 12/24/23 0400  AST 29 36 49*  --   --   --   ALT 23 23 35  --   --   --   ALKPHOS 97 76 75  --   --   --   BILITOT 0.6 0.5 0.6  --   --   --   PROT 7.8 6.9 6.5  --   --   --   ALBUMIN 4.1 3.5 3.1* 3.1* 3.0* 2.8*   No results for input(s): "LIPASE", "AMYLASE" in the last 168 hours. No results for input(s): "AMMONIA" in the last 168 hours.  ABG    Component Value Date/Time   HCO3 29.4 (H) 12/20/2023 0159   O2SAT 100 12/20/2023 0159     Coagulation Profile: No results for input(s): "INR", "PROTIME" in the last 168 hours.  Cardiac Enzymes: No results for input(s): "CKTOTAL", "CKMB", "CKMBINDEX", "TROPONINI" in the last 168 hours.  HbA1C: Hemoglobin A1C  Date/Time Value Ref Range Status  12/11/2023 04:01 PM 6.2 (A) 4.0 - 5.6 % Final   Hgb A1c MFr Bld  Date/Time Value Ref Range Status  05/22/2018 12:02 AM 6.2 (H) 4.8 - 5.6 % Final    Comment:    (NOTE) Pre diabetes:          5.7%-6.4% Diabetes:              >6.4% Glycemic control for   <7.0% adults with diabetes     CBG: Recent Labs  Lab 12/23/23 0724 12/23/23 1116 12/23/23 1555 12/23/23 2100 12/24/23 0729  GLUCAP 128* 224* 150* 156* 131*    Review of Systems:   Positives in BOLD: Gen: Denies fever, chills, weight change, fatigue, night  sweats HEENT: Denies blurred vision, double vision, hearing loss, tinnitus, sinus congestion, rhinorrhea, sore throat, neck stiffness,  dysphagia PULM: Denies shortness of breath, cough, sputum production, hemoptysis, wheezing CV: Denies chest pain, edema, orthopnea, paroxysmal nocturnal dyspnea, palpitations GI: Denies abdominal pain, nausea, vomiting, diarrhea, hematochezia, melena, constipation, change in bowel habits GU: Denies dysuria, hematuria, polyuria, oliguria, urethral discharge Endocrine: Denies hot or cold intolerance, polyuria, polyphagia or appetite change Derm: Denies rash, dry skin, scaling or peeling skin change Heme: Denies easy bruising, bleeding, bleeding gums Neuro: Denies headache, numbness, weakness, slurred speech, loss of memory or consciousness   Past Medical History:  She,  has a past medical history of Chronic respiratory failure with hypoxia (HCC), Cigarette smoker, COPD (chronic obstructive pulmonary disease) (HCC), Dependence on supplemental oxygen, Depression with anxiety, Endometriosis, GERD (gastroesophageal reflux disease), Grade I diastolic dysfunction, Hyperlipidemia, Kidney stones, and Reactive airway disease (03/08/2014).   Surgical History:   Past Surgical History:  Procedure Laterality Date   CATARACT EXTRACTION W/PHACO Right 12/04/2023   Procedure: CATARACT EXTRACTION PHACO AND INTRAOCULAR LENS PLACEMENT (IOC) RIGHT  CLAREON VIVITY LENS;  Surgeon: Estanislado Pandy, MD;  Location: [facility];  Service: Ophthalmology;  Laterality: Right;  10.40 1:04.0   CATARACT EXTRACTION W/PHACO Left 12/18/2023   Procedure: CATARACT EXTRACTION PHACO AND INTRAOCULAR LENS PLACEMENT (IOC) LEFT  CLAREON VIVITY TORIC 16.28, 01:19.2;  Surgeon: Estanislado Pandy, MD;  Location: [facility];  Service: Ophthalmology;  Laterality: Left;   COLONOSCOPY WITH PROPOFOL N/A 01/02/2023   Procedure: COLONOSCOPY WITH PROPOFOL;  Surgeon: Toney Reil, MD;  Location: [facility] END[facility]COPY;  Service: Gastroenterology;  Laterality: N/A;   ESOPHAGOGASTRODUODENOSCOPY N/A 07/29/2018   Procedure:  ESOPHAGOGASTRODUODENOSCOPY (EGD);  Surgeon: Toledo, Boykin Nearing, MD;  Location: [facility] ENDOSCOPY;  Service: Gastroenterology;  Laterality: N/A;   ESOPHAGOGASTRODUODENOSCOPY (EGD) WITH PROPOFOL N/A 01/02/2023   Procedure: ESOPHAGOGASTRODUODENOSCOPY (EGD) WITH PROPOFOL;  Surgeon: Toney Reil, MD;  Location: [facility] ENDOSCOPY;  Service: Gastroenterology;  Laterality: N/A;   KIDNEY STONE SURGERY     TONSILLECTOMY       Social History:   reports that she has been smoking cigarettes. She started smoking about 50 years ago. She has a 26.8 pack-year smoking history. She has never used smokeless tobacco. She reports current alcohol use. She reports that she does not use drugs.   Family History:  Her family history includes GER disease in her father and mother; Heart attack in her father; Hyperlipidemia in her mother; Hypertension in her father and mother; Parkinson's disease in her father.   Allergies Allergies  Allergen Reactions   Naproxen Itching   Sulfa Antibiotics Hives and Rash     Home Medications  Prior to Admission medications   Medication Sig Start Date End Date Taking? Authorizing Provider  ALPRAZolam (XANAX) 0.25 MG tablet Take 1 tablet (0.25 mg total) by mouth 3 (three) times daily as needed for anxiety. 12/11/23  Yes Abernathy, Arlyss Repress, NP  ascorbic acid (VITAMIN C) 500 MG tablet Take 500 mg by mouth daily.   Yes [provider]  aspirin 81 MG tablet Take 81 mg by mouth at bedtime.    Yes [provider]  azithromycin (ZITHROMAX) 250 MG tablet Use as directed for 5 days. 12/19/23  Yes McDonough, Salomon Fick, PA-C  Cholecalciferol 25 MCG (1000 UT) tablet Take 1,000 Units by mouth daily.    Yes [provider]  Coenzyme Q10 (CO Q-10) 100 MG CAPS Take 200 mg by mouth daily.    Yes [provider]  DULERA 200-5 MCG/ACT AERO INHALE 1 PUFF INTO THE LUNGS TWICE DAILY 05/23/23  Yes Abernathy, Alyssa, NP  DULoxetine (CYMBALTA) 60 MG capsule Take 1 capsule (60 mg  total) by mouth daily. 11/02/23  Yes Lyndon Code, MD  famotidine (PEPCID) 40 MG tablet Take 1 tablet (40 mg total) by mouth 2 (two) times daily. 07/02/23  Yes Abernathy, Arlyss Repress, NP  furosemide (LASIX) 40 MG tablet TAKE 1 TABLET BY MOUTH DAILY 02/24/23  Yes Abernathy, Alyssa, NP  gemfibrozil (LOPID) 600 MG tablet Take 1 tablet (600 mg total) by mouth 2 (two) times daily before a meal. 12/11/23  Yes Abernathy, Alyssa, NP  Ipratropium-Albuterol (COMBIVENT RESPIMAT) 20-100 MCG/ACT AERS respimat INHALE 1 PUFF INTO THE LUNGS EVERY 6 HOURS AS NEEDED FOR WHEEZING OR SHORTNESS OF BREATH 10/31/23  Yes Lyndon Code, MD  Magnesium 400 MG TABS Take 400 mg by mouth daily.    Yes [provider]  mometasone (NASONEX) 50 MCG/ACT nasal spray Place 2 sprays into the nose daily.   Yes [provider]  montelukast (SINGULAIR) 10 MG tablet Take 1 tablet (10 mg total) by mouth daily. 01/31/23  Yes Abernathy, Arlyss Repress, NP  Multiple Vitamins-Minerals (MULTIVITAMIN WITH MINERALS) tablet Take 1 tablet by mouth daily.   Yes [provider]  omeprazole (PRILOSEC) 40 MG capsule Take 1 capsule (40 mg total) by mouth 2 (two) times daily. 12/04/23  Yes Abernathy, Arlyss Repress, NP  Potassium Chloride CR (MICRO-K) 8 MEQ CPCR capsule CR Take 1 capsule (8 mEq total) by mouth daily. 10/26/23  Yes Lyndon Code, MD  revefenacin [facility]) 175 MCG/3ML nebulizer solution USE 1 VIAL IN NEBULIZER DAILY 08/11/23  Yes Abernathy, Alyssa, NP  Roflumilast 250 MCG TABS TAKE 1 TABLET BY MOUTH EVERY DAY 11/27/23  Yes Abernathy, Alyssa, NP  simvastatin (ZOCOR) 20 MG tablet TAKE 1 TABLET(20 MG) BY MOUTH EVERY EVENING FOR CHOLESTEROL 11/06/23  Yes Abernathy, Alyssa, NP  SPIRIVA HANDIHALER 18 MCG inhalation capsule Place 1 capsule (18 mcg total) into inhaler and inhale daily. i 12/09/23  Yes Abernathy, Arlyss Repress, NP  vitamin E 180 MG (400 UNITS) capsule Take by mouth.   Yes [provider]  DENTA 5000 PLUS 1.1 % CREA dental cream Take 1  application by mouth daily. 04/14/21   [provider]  OXYGEN Inhale 4 L into the lungs continuous. Continuous oxygen and NIV at night, 2 liters   pt uses [facility] for oxygen supplies    [provider]  Varenicline Tartrate, Starter, 0.5 MG X 11 & 1 MG X 42 TBPK Take 0.5 mg by mouth once daily on days 1-3, then take 0.5 mg twice daily on days 4-7, then increase to 1 mg twice daily Patient not taking: Reported on 12/20/2023 08/13/23   Sallyanne Kuster, NP       Vida Rigger, M.D.  Pulmonary & Critical Care Medicine

## 2023-12-24 NOTE — Progress Notes (Signed)
PHARMACY CONSULT NOTE - FOLLOW UP  Pharmacy Consult for Electrolyte Monitoring and Replacement   Recent Labs: Potassium (mmol/L)  Date Value  12/24/2023 4.4   Magnesium (mg/dL)  Date Value  16/08/9603 2.0   Calcium (mg/dL)  Date Value  54/07/8118 8.9   Albumin (g/dL)  Date Value  14/78/2956 2.8 (L)  11/21/2023 4.4   Phosphorus (mg/dL)  Date Value  21/30/8657 1.9 (L)   Sodium (mmol/L)  Date Value  12/24/2023 140  11/21/2023 141     Assessment: 68 year old female history of COPD and chronic hypoxic respiratory failure on 4 L baseline who presented to the ED with chief complaint of severe respiratory distress. Pharmacy is asked to follow and replace electrolytes.  Goal of Therapy:  Electrolytes WNL  Plan:  ---no electrolyte replacement warranted for today ---recheck electrolytes in am  Lowella Bandy ,PharmD Clinical Pharmacist 12/24/2023 7:27 AM

## 2023-12-24 NOTE — Progress Notes (Signed)
Lars Mage RN call report to 1A. Report was received by Evaristo Bury.

## 2023-12-24 NOTE — Plan of Care (Signed)
  Problem: Education: Goal: Ability to describe self-care measures that may prevent or decrease complications (Diabetes Survival Skills Education) will improve Outcome: Progressing Goal: Individualized Educational Video(s) Outcome: Progressing   Problem: Fluid Volume: Goal: Ability to maintain a balanced intake and output will improve Outcome: Progressing   Problem: Health Behavior/Discharge Planning: Goal: Ability to identify and utilize available resources and services will improve Outcome: Progressing Goal: Ability to manage health-related needs will improve Outcome: Progressing   Problem: Metabolic: Goal: Ability to maintain appropriate glucose levels will improve Outcome: Progressing   Problem: Nutritional: Goal: Maintenance of adequate nutrition will improve Outcome: Progressing Goal: Progress toward achieving an optimal weight will improve Outcome: Progressing   Problem: Skin Integrity: Goal: Risk for impaired skin integrity will decrease Outcome: Progressing   Problem: Tissue Perfusion: Goal: Adequacy of tissue perfusion will improve Outcome: Progressing   Problem: Education: Goal: Knowledge of General Education information will improve Description: Including pain rating scale, medication(s)/side effects and non-pharmacologic comfort measures Outcome: Progressing   Problem: Health Behavior/Discharge Planning: Goal: Ability to manage health-related needs will improve Outcome: Progressing   Problem: Clinical Measurements: Goal: Ability to maintain clinical measurements within normal limits will improve Outcome: Progressing Goal: Will remain free from infection Outcome: Progressing Goal: Diagnostic test results will improve Outcome: Progressing Goal: Respiratory complications will improve Outcome: Progressing Goal: Cardiovascular complication will be avoided Outcome: Progressing   Problem: Activity: Goal: Risk for activity intolerance will  decrease Outcome: Progressing   Problem: Nutrition: Goal: Adequate nutrition will be maintained Outcome: Progressing   Problem: Coping: Goal: Level of anxiety will decrease Outcome: Progressing   Problem: Elimination: Goal: Will not experience complications related to bowel motility Outcome: Progressing Goal: Will not experience complications related to urinary retention Outcome: Progressing   Problem: Pain Managment: Goal: General experience of comfort will improve and/or be controlled Outcome: Progressing   Problem: Safety: Goal: Ability to remain free from injury will improve Outcome: Progressing   Problem: Skin Integrity: Goal: Risk for impaired skin integrity will decrease Outcome: Progressing

## 2023-12-25 DIAGNOSIS — J1100 Influenza due to unidentified influenza virus with unspecified type of pneumonia: Secondary | ICD-10-CM | POA: Diagnosis not present

## 2023-12-25 DIAGNOSIS — J441 Chronic obstructive pulmonary disease with (acute) exacerbation: Secondary | ICD-10-CM | POA: Diagnosis not present

## 2023-12-25 DIAGNOSIS — J9601 Acute respiratory failure with hypoxia: Secondary | ICD-10-CM | POA: Diagnosis not present

## 2023-12-25 DIAGNOSIS — A491 Streptococcal infection, unspecified site: Secondary | ICD-10-CM | POA: Diagnosis not present

## 2023-12-25 LAB — CBC
HCT: 36.1 % (ref 36.0–46.0)
Hemoglobin: 11.5 g/dL — ABNORMAL LOW (ref 12.0–15.0)
MCH: 27.4 pg (ref 26.0–34.0)
MCHC: 31.9 g/dL (ref 30.0–36.0)
MCV: 86 fL (ref 80.0–100.0)
Platelets: 310 10*3/uL (ref 150–400)
RBC: 4.2 MIL/uL (ref 3.87–5.11)
RDW: 14.6 % (ref 11.5–15.5)
WBC: 7.7 10*3/uL (ref 4.0–10.5)
nRBC: 0 % (ref 0.0–0.2)

## 2023-12-25 LAB — RENAL FUNCTION PANEL
Albumin: 2.8 g/dL — ABNORMAL LOW (ref 3.5–5.0)
Anion gap: 9 (ref 5–15)
BUN: 24 mg/dL — ABNORMAL HIGH (ref 8–23)
CO2: 35 mmol/L — ABNORMAL HIGH (ref 22–32)
Calcium: 9.1 mg/dL (ref 8.9–10.3)
Chloride: 96 mmol/L — ABNORMAL LOW (ref 98–111)
Creatinine, Ser: 0.35 mg/dL — ABNORMAL LOW (ref 0.44–1.00)
GFR, Estimated: >60 mL/min (ref >60)
Glucose, Bld: 105 mg/dL — ABNORMAL HIGH (ref 70–99)
Phosphorus: 2.7 mg/dL (ref 2.5–4.6)
Potassium: 4.5 mmol/L (ref 3.5–5.1)
Sodium: 140 mmol/L (ref 135–145)

## 2023-12-25 LAB — GLUCOSE, CAPILLARY
Glucose-Capillary: 78 mg/dL (ref 70–99)
Glucose-Capillary: 171 mg/dL — ABNORMAL HIGH (ref 70–99)
Glucose-Capillary: 134 mg/dL — ABNORMAL HIGH (ref 70–99)
Glucose-Capillary: 122 mg/dL — ABNORMAL HIGH (ref 70–99)

## 2023-12-25 MED ORDER — ARFORMOTEROL TARTRATE 15 MCG/2ML IN NEBU: 15.0000 ug | INHALATION_SOLUTION | Freq: Two times a day (BID) | RESPIRATORY_TRACT | Status: DC

## 2023-12-25 MED ORDER — REVEFENACIN 175 MCG/3ML IN SOLN
175.0000 ug | Freq: Every day | RESPIRATORY_TRACT | Status: DC
  Administered 2023-12-26: 175 ug via RESPIRATORY_TRACT
  Filled 2023-12-25: qty 3

## 2023-12-25 MED ORDER — PANTOPRAZOLE SODIUM 40 MG PO TBEC
40.0000 mg | DELAYED_RELEASE_TABLET | Freq: Every day | ORAL | Status: DC
  Administered 2023-12-25 – 2023-12-26 (×2): 40 mg via ORAL
  Filled 2023-12-25 (×2): qty 1

## 2023-12-25 NOTE — Progress Notes (Signed)
PHARMACY CONSULT NOTE - FOLLOW UP  Pharmacy Consult for Electrolyte Monitoring and Replacement   Recent Labs: Potassium (mmol/L)  Date Value  12/25/2023 4.5   Magnesium (mg/dL)  Date Value  32/44/0102 2.0   Calcium (mg/dL)  Date Value  72/53/6644 9.1   Albumin (g/dL)  Date Value  03/47/4259 2.8 (L)  11/21/2023 4.4   Phosphorus (mg/dL)  Date Value  56/38/7564 2.7   Sodium (mmol/L)  Date Value  12/25/2023 140  11/21/2023 141     Assessment: 68 year old female history of COPD and chronic hypoxic respiratory failure on 4 L baseline who presented to the ED with chief complaint of severe respiratory distress. Pharmacy is asked to follow and replace electrolytes.  Goal of Therapy:  Electrolytes WNL  Plan:  no electrolyte replacement warranted for today Because this consult was generated as part of an ICU order set and patient is transferring pharmacy will sign off for now Please feel free to reach out if any further assistance is needed  Lowella Bandy ,PharmD Clinical Pharmacist 12/25/2023 7:22 AM

## 2023-12-25 NOTE — Plan of Care (Signed)

## 2023-12-25 NOTE — Evaluation (Signed)
Physical Therapy Evaluation Patient Details Name: Rachael Blankenship MRN: [id number] DOB: Aug 03, 1956 Today's Date: 12/25/2023  History of Present Illness  68 y.o. female admitted with Acute on Chronic Hypoxic & Hypercapnic Respiratory Failure in the setting of Acute COPD Exacerbation, Influenza A Infection, and superimposed Strep Pneumoniae Pneumonia requiring BiPAP.  Clinical Impression  Pt with good overall effort and safety with ~90 ft of ambulation on her baseline 4L O2 (using walker, no AD at baseline).  Pt displayed some mild weakness and subjectively reports feeling weaker than her baseline and did require a walker but had no LOBs or overt safety issues.  Pt had slowly dropping SpO2 t/o the effort but no SOB or DOE.  Pt in not at her baseline and will benefit from continued PT.        If plan is discharge home, recommend the following: Assist for transportation;Assistance with cooking/housework;Help with stairs or ramp for entrance   Can travel by private vehicle        Equipment Recommendations Rolling walker (2 wheels) (per progress may not need)  Recommendations for Other Services       Functional Status Assessment Patient has had a recent decline in their functional status and demonstrates the ability to make significant improvements in function in a reasonable and predictable amount of time.     Precautions / Restrictions Precautions Precautions: Fall Restrictions Weight Bearing Restrictions Per Provider Order: No      Mobility  Bed Mobility               General bed mobility comments: in recliner pre/post session    Transfers Overall transfer level: Modified independent Equipment used: Rolling walker (2 wheels) Transfers: Sit to/from Stand Sit to Stand: Modified independent (Device/Increase time)           General transfer comment: minimal cuing, assist with lines/leads, able to rise confidently and w/o issue    Ambulation/Gait Ambulation/Gait  assistance: Supervision Gait Distance (Feet): 100 Feet Assistive device: Rolling walker (2 wheels)         General Gait Details: Multiple loops in the room on 4L with FWW.  Pt with no LOBs, good ability to manage turns in tight corners, minimal fatigue and ultimately did quite well.  SpO2 remained in the 90s t/o the effort (Slowly dropping to low 90s) with HR stable in the 80-90s range  Stairs            Wheelchair Mobility     Tilt Bed    Modified Rankin (Stroke Patients Only)       Balance Overall balance assessment: Modified Independent Sitting-balance support: No upper extremity supported, Feet supported Sitting balance-Leahy Scale: Good     Standing balance support: Bilateral upper extremity supported Standing balance-Leahy Scale: Good                               Pertinent Vitals/Pain Pain Assessment Pain Assessment: No/denies pain    Home Living Family/patient expects to be discharged to:: Private residence Living Arrangements: Spouse/significant other Available Help at Discharge: Family Type of Home: House Home Access: Stairs to enter Entrance Stairs-Rails: Right;Left (wide) Entrance Stairs-Number of Steps: 5   Home Layout: Two level;Laundry or work area in basement;Able to live on main level with [employer]:  (husband to check, may have cane, walker and or w/c?)      Prior Function Prior Level of Function : Independent/Modified Independent  Mobility Comments: Apparently recently only out of the house occasionally but independent with in home mobility, etc ADLs Comments: on 4L [place] baseline     Extremity/Trunk Assessment   Upper Extremity Assessment Upper Extremity Assessment: Overall WFL for tasks assessed    Lower Extremity Assessment Lower Extremity Assessment: Generalized weakness;Overall WFL for tasks assessed       Communication   Communication Communication: Hearing  impairment Cueing Techniques: Verbal cues  Cognition Arousal: Alert Behavior During Therapy: WFL for tasks assessed/performed Overall Cognitive Status: Within Functional Limits for tasks assessed                                          General Comments General comments (skin integrity, edema, etc.): Pt weaker than her baseline and required an AD that she does not normally need, but ultimatley moved well and with relative safety and confidence    Exercises     Assessment/Plan    PT Assessment Patient needs continued PT services  PT Problem List Decreased activity tolerance;Decreased safety awareness;Cardiopulmonary status limiting activity;Decreased strength       PT Treatment Interventions DME instruction;Gait training;Stair training;Functional mobility training;Therapeutic activities;Therapeutic exercise;Balance training;Neuromuscular re-education;Patient/family education    PT Goals (Current goals can be found in the Care Plan section)  Acute Rehab PT Goals Patient Stated Goal: go home PT Goal Formulation: With patient/family Time For Goal Achievement: 01/07/24 Potential to Achieve Goals: Good    Frequency Min 1X/week     Co-evaluation               AM-PAC PT "6 Clicks" Mobility  Outcome Measure Help needed turning from your back to your side while in a flat bed without using bedrails?: None Help needed moving from lying on your back to sitting on the side of a flat bed without using bedrails?: None Help needed moving to and from a bed to a chair (including a wheelchair)?: None Help needed standing up from a chair using your arms (e.g., wheelchair or bedside chair)?: None Help needed to walk in hospital room?: None Help needed climbing 3-5 steps with a railing? : A Little 6 Click Score: 23    End of Session Equipment Utilized During Treatment: Oxygen Activity Tolerance: Patient tolerated treatment well Patient left: in chair Nurse  Communication: Mobility status PT Visit Diagnosis: Other abnormalities of gait and mobility (R26.89);Muscle weakness (generalized) (M62.81)    Time: 1610-9604 PT Time Calculation (min) (ACUTE ONLY): 17 min   Charges:   PT Evaluation $PT Eval Low Complexity: 1 Low   PT General Charges $$ ACUTE PT VISIT: 1 Visit         Malachi Pro, DPT 12/25/2023, 4:39 PM

## 2023-12-25 NOTE — Progress Notes (Signed)
0800 patient alert x4 able to make all needs known on 4l [place] baseline for home patient used commode then went to chair able to provide mostly her own ADL care. Patient brushed teeth and took AM meds anxiety meds given per patient request

## 2023-12-25 NOTE — Progress Notes (Signed)
Progress Note   Patient: Rachael Blankenship ZOX:[id number] DOB: Apr 07, 1956 DOA: 12/19/2023     5 DOS: the patient was seen and examined on 12/25/2023   Brief hospital course: Rachael Blankenship is a 68 y.o. female with a past medical history of COPD and chronic hypoxic respiratory failure on 4 L baseline who presented to the ED with chief complaint of severe respiratory distress with associated fever and cough.  She is initially admitted to [facility] service and later transferred to ICU as she is requiring BiPAP and is at high risk for intubation.  Patient tested positive for influenza A infection and superimposed strep pneumonia pneumonia.  Patient was placed on BiPAP and ICU, continued on ceftriaxone, azithromycin, steroids, bronchodilators.  She did improve and is currently on 4 L chronic oxygen transferred to [facility] service 12/23/2023.  Assessment and Plan: Acute on chronic hypoxic and hypercapnic respiratory failure Acute COPD exacerbation Continue supplemental oxygen to maintain saturation greater than 90%. Continue bronchodilators. Bipap while sleeping and PRN. Continue prednisone 50 mg daily. Encourage incentive spirometry, flutter valve. Out of bed to chair. Transferred to progressive care unit.  Sepsis present on admission Influenza A infection Strep pneumonia pneumonia Fever improved. Blood cultures so far negative. Finished Tamiflu for 5 days. Received azithromycin and ceftriaxone therapy.  Elevated troponin Possibly type II MI due to demand ischemia. Echocardiogram showed normal EF, borderline diastolic dysfunction. She got 1 dose 40 mg IV Lasix. Monitor daily weights, strict input and output.  Hyperglycemia in the setting of steroids, acute illness. A1c 6.2. Continue Accu-Cheks, sliding scale insulin. Continue to taper steroids. Hypoglycemia protocol.  Generalized weakness Encourage out of bed to chair. PT OT evaluation  Encourage out of bed to chair, incentive  spirometry. Nursing supportive care. Fall, aspiration precautions. DVT prophylaxis   Code Status: Full Code  Subjective: Patient is seen and examined today morning. She is sitting in chair, on 4L supplemental O2. Mild respiratory distress. Tachycardia improved. No family at bedside. Eating fair.   Physical Exam: Vitals:   12/25/23 0600 12/25/23 0700 12/25/23 0800 12/25/23 1208  BP: 125/67  (!) 157/95 (!) 162/84  Pulse: 72 81 (!) 103 70  Resp: (!) 24 (!) 22 (!) 24 (!) 22  Temp:   98.1 F (36.7 C) 98.1 F (36.7 C)  TempSrc:   Oral Oral  SpO2: 100% 99% 98% 99%  Weight:      Height:        General - Elderly ill Caucasian female, mild respiratory distress HEENT - PERRLA, EOMI, atraumatic head, non tender sinuses. Lung - Clear, diffuse rales, basilar rhonchi, wheezes. Heart - S1, S2 heard, tachycardia, trace pedal edema. Abdomen - Soft, non tender, nondistended, bowel sounds good Neuro - Alert, awake and oriented x 3, non focal exam. Skin - Warm and dry.  Data Reviewed:      Latest Ref Rng & Units 12/25/2023    3:43 AM 12/24/2023    4:00 AM 12/23/2023    3:33 AM  CBC  WBC 4.0 - 10.5 K/uL 7.7  5.6  4.4   Hemoglobin 12.0 - 15.0 g/dL 81.1  91.4  78.2   Hematocrit 36.0 - 46.0 % 36.1  36.6  35.5   Platelets 150 - 400 K/uL 310  297  281       Latest Ref Rng & Units 12/25/2023    3:43 AM 12/24/2023    4:00 AM 12/23/2023    3:34 AM  BMP  Glucose 70 - 99 mg/dL 956  213  147   BUN 8 - 23 mg/dL 24  29  32   Creatinine 0.44 - 1.00 mg/dL 0.98  1.19  1.47   Sodium 135 - 145 mmol/L 140  140  141   Potassium 3.5 - 5.1 mmol/L 4.5  4.4  3.5   Chloride 98 - 111 mmol/L 96  95  97   CO2 22 - 32 mmol/L 35  36  35   Calcium 8.9 - 10.3 mg/dL 9.1  8.9  8.2    No results found.  Family Communication: Discussed with patient, she understand and agree. All questions answereed.  Disposition: Status is: Inpatient Remains inpatient appropriate because: Hypoxia, on BiPAP, sepsis, respiratory  failure  Planned Discharge Destination: Home with Home Health     Time spent: 39 minutes  Author: Marcelino Duster, MD 12/25/2023 2:50 PM Secure chat 7am to 7pm For on call review [email or web address].

## 2023-12-25 NOTE — Evaluation (Signed)
Occupational Therapy Evaluation Patient Details Name: Rachael Blankenship MRN: [id number] DOB: August 23, 1956 Today's Date: 12/25/2023   History of Present Illness 68 y.o. female admitted with Acute on Chronic Hypoxic & Hypercapnic Respiratory Failure in the setting of Acute COPD Exacerbation, Influenza A Infection, and superimposed Strep Pneumoniae Pneumonia requiring BiPAP.   Clinical Impression   Rachael Blankenship was seen for OT evaluation this date. Prior to hospital admission, pt was IND. Pt lives with spouse. Pt currently requires CGA + RW for ADL t/f ~15 ft, SpO2 95% on baseline 4L [place]. MOD I seated grooming tasks. Postural sway noted in standing with no UE support, improved with RW use. Pt would benefit from skilled OT to address noted impairments and functional limitations (see below for any additional details). Upon hospital discharge, recommend OT follow up.    If plan is discharge home, recommend the following: A little help with walking and/or transfers;A little help with bathing/dressing/bathroom;Supervision due to cognitive status    Functional Status Assessment  Patient has had a recent decline in their functional status and demonstrates the ability to make significant improvements in function in a reasonable and predictable amount of time.  Equipment Recommendations  BSC/3in1    Recommendations for Other Services       Precautions / Restrictions Precautions Precautions: Fall Restrictions Weight Bearing Restrictions Per Provider Order: No      Mobility Bed Mobility               General bed mobility comments: not tested    Transfers Overall transfer level: Needs assistance Equipment used: Rolling walker (2 wheels) Transfers: Sit to/from Stand Sit to Stand: Contact guard assist           General transfer comment: cues      Balance Overall balance assessment: Needs assistance Sitting-balance support: No upper extremity supported, Feet supported Sitting  balance-Leahy Scale: Good     Standing balance support: Bilateral upper extremity supported Standing balance-Leahy Scale: Fair                             ADL either performed or assessed with clinical judgement   ADL Overall ADL's : Needs assistance/impaired                                       General ADL Comments: CGA + RW for simulated toilet t/f. MOD I seated grooming tasks. Posturalsway noted in standing with clothing mgmt    Pertinent Vitals/Pain Pain Assessment Pain Assessment: No/denies pain     Extremity/Trunk Assessment Upper Extremity Assessment Upper Extremity Assessment: Overall WFL for tasks assessed   Lower Extremity Assessment Lower Extremity Assessment: Generalized weakness       Communication Communication Communication: Hearing impairment Cueing Techniques: Verbal cues   Cognition Arousal: Alert Behavior During Therapy: WFL for tasks assessed/performed Overall Cognitive Status: Within Functional Limits for tasks assessed                                                  Home Living Family/patient expects to be discharged to:: Private residence Living Arrangements: Spouse/significant other 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 Available Help at Discharge: Family Type of Home: House Home Access: Stairs to enter [employer] of  Steps: 5 Entrance Stairs-Rails: Right Home Layout: Two level;Laundry or work area in basement;Able to live on main level with bedroom/bathroom                   Additional Comments: 2 steps to sunken den      Prior Functioning/Environment Prior Level of Function : Independent/Modified Independent               ADLs Comments: on 4L [place] baseline        OT Problem List: Decreased strength;Decreased range of motion;Decreased activity tolerance;Impaired balance (sitting and/or standing)      OT Treatment/Interventions: Self-care/ADL training;Therapeutic  exercise;Energy conservation;DME and/or AE instruction;Therapeutic activities    OT Goals(Current goals can be found in the care plan section) Acute Rehab OT Goals Patient Stated Goal: to go home OT Goal Formulation: With patient Time For Goal Achievement: 01/08/24 Potential to Achieve Goals: Good ADL Goals Pt Will Perform Grooming: Independently;standing Pt Will Perform Lower Body Dressing: Independently;sit to/from stand Pt Will Transfer to Toilet: Independently;ambulating;regular height toilet  OT Frequency: Min 1X/week    Co-evaluation              AM-PAC OT "6 Clicks" Daily Activity     Outcome Measure Help from another person eating meals?: None Help from another person taking care of personal grooming?: None Help from another person toileting, which includes using toliet, bedpan, or urinal?: A Little Help from another person bathing (including washing, rinsing, drying)?: A Little Help from another person to put on and taking off regular upper body clothing?: None Help from another person to put on and taking off regular lower body clothing?: A Little 6 Click Score: 21   End of Session Equipment Utilized During Treatment: Rolling walker (2 wheels) Nurse Communication: Mobility status  Activity Tolerance: Patient tolerated treatment well Patient left: in chair;with call bell/phone within reach  OT Visit Diagnosis: Other abnormalities of gait and mobility (R26.89);Muscle weakness (generalized) (M62.81)                Time: 1610-9604 OT Time Calculation (min): 14 min Charges:  OT General Charges $OT Visit: 1 Visit OT Evaluation $OT Eval Moderate Complexity: 1 Mod  Kathie Dike, M.S. OTR/L  12/25/23, 9:33 AM  ascom [phone]

## 2023-12-25 NOTE — Plan of Care (Signed)
  Problem: Education: Goal: Ability to describe self-care measures that may prevent or decrease complications (Diabetes Survival Skills Education) will improve Outcome: Progressing Goal: Individualized Educational Video(s) Outcome: Progressing   Problem: Coping: Goal: Ability to adjust to condition or change in health will improve Outcome: Progressing   Problem: Fluid Volume: Goal: Ability to maintain a balanced intake and output will improve Outcome: Progressing   Problem: Health Behavior/Discharge Planning: Goal: Ability to identify and utilize available resources and services will improve Outcome: Progressing Goal: Ability to manage health-related needs will improve Outcome: Progressing   Problem: Metabolic: Goal: Ability to maintain appropriate glucose levels will improve Outcome: Progressing   Problem: Nutritional: Goal: Maintenance of adequate nutrition will improve Outcome: Not Progressing Goal: Progress toward achieving an optimal weight will improve Outcome: Not Progressing   Problem: Skin Integrity: Goal: Risk for impaired skin integrity will decrease Outcome: Progressing   Problem: Tissue Perfusion: Goal: Adequacy of tissue perfusion will improve Outcome: Progressing   Problem: Education: Goal: Knowledge of General Education information will improve Description: Including pain rating scale, medication(s)/side effects and non-pharmacologic comfort measures Outcome: Progressing   Problem: Health Behavior/Discharge Planning: Goal: Ability to manage health-related needs will improve Outcome: Progressing   Problem: Clinical Measurements: Goal: Ability to maintain clinical measurements within normal limits will improve Outcome: Progressing Goal: Will remain free from infection Outcome: Progressing Goal: Diagnostic test results will improve Outcome: Progressing Goal: Respiratory complications will improve Outcome: Progressing Goal: Cardiovascular  complication will be avoided Outcome: Progressing   Problem: Activity: Goal: Risk for activity intolerance will decrease Outcome: Progressing   Problem: Nutrition: Goal: Adequate nutrition will be maintained Outcome: Not Progressing   Problem: Coping: Goal: Level of anxiety will decrease Outcome: Progressing   Problem: Elimination: Goal: Will not experience complications related to bowel motility Outcome: Progressing Goal: Will not experience complications related to urinary retention Outcome: Progressing   Problem: Pain Managment: Goal: General experience of comfort will improve and/or be controlled Outcome: Progressing   Problem: Safety: Goal: Ability to remain free from injury will improve Outcome: Progressing   Problem: Skin Integrity: Goal: Risk for impaired skin integrity will decrease Outcome: Progressing

## 2023-12-25 NOTE — Progress Notes (Signed)
Blankenship:  Rachael Blankenship, MRN:  [id number], DOB:  [birth year], LOS: 5 ADMISSION DATE:  12/19/2023, CONSULTATION DATE:  12/21/2023 REFERRING MD:  Dr. Georgeann Oppenheim, CHIEF COMPLAINT:  Acute on Chronic Hypoxic Respiratory Failure   Brief Pt Description / Synopsis:  68 y.o. female admitted with Acute on Chronic Hypoxic & Hypercapnic Respiratory Failure in the setting of Acute COPD Exacerbation, Influenza A Infection, and superimposed Strep Pneumoniae Pneumonia requiring BiPAP.  High risk for intubation.  History of Present Illness:  Rachael Blankenship is a 68 y.o. female with a past medical history of COPD and chronic hypoxic respiratory failure on 4 L baseline who presented to the ED with chief complaint of severe respiratory distress with associated fever and cough.  Pt currently with respiratory distress on BiPAP, therefore history is obtained from chart review.  Per H&P from Hospitalists, she had progressive cough and shortness of breath over several days.  She had minimal relief with nebulizer treatments and Azithromycin at home.  Upon EMS arrival she noted to have severe respiratory distress of which she was placed on non-rebreather mask and  given magnesium, solumedrol and nebulizers.  In group she became transiently unresponsive and required placement of BiPAP on upon arrival.  ED Course: Initial Vital Signs: Temperature 99 Fahrenheit orally, pulse 126, respiratory rate 25, blood pressure 186/111, SpO2 100% on nonrebreather Significant Labs: Glucose 273, BNP 49, high-sensitivity troponin peaked at 582, lactic acid 0.9, WBC 16.0, procalcitonin 1.08 Positive for influenza A Strep pneumo urinary antigen positive Imaging Chest X-ray>>IMPRESSION: 1. No active disease. 2. Aortic Atherosclerosis    She was admitted by the hospitalist for further workup and treatment.  Please see significant hospital events section below for full detailed hospital course.  12/22/23- for TRH today, reduced phlegm, off bipap  today.  Procalcitonin is improved. CBC stable , bmp with electrolyte derrangements with pharmacy consultation for repletion.  12/23/23- patient rquired bipap overnight with lasix.  She still has labored breathing. Reports air hunger did improve with dose of narcotics overnight. Diet has been started.  Will continue to follow from pulmonary perspective.  12/24/23- Patient slowly improving, she reports GERD related chest discomfort, her breathing is more stable.  She reports panic attacks and asks for extra dose of xanax to be delivered.  12/25/23-patient seen at bedside. Husband wanted to meet with me and  we discussed patients chronic pulmonary disease. Hse does have outpatient CPAP.  She has significant anxiety and depression which is affecting her overall prognosis.  She is physically deconditioned and would benefit from pulmonary rehab.   Pertinent  Medical History   Past Medical History:  Diagnosis Date   Chronic respiratory failure with hypoxia (HCC)    Cigarette smoker    COPD (chronic obstructive pulmonary disease) (HCC)    Dependence on supplemental oxygen    Depression with anxiety    Endometriosis    GERD (gastroesophageal reflux disease)    Grade I diastolic dysfunction    Hyperlipidemia    Kidney stones    Reactive airway disease 03/08/2014    Micro Data:  1/24: COVID/RSV/FLU PCR>>  + Influenza A 1/24: Blood culture x2>> no growth to date 1/25: Strep Pneumo urinary antigen + 1/25: HIV Screen>> nonreactive  Antimicrobials:   Anti-infectives (From admission, onward)    Start     Dose/Rate Route Frequency Ordered Stop   12/24/23 1830  azithromycin (ZITHROMAX) tablet 250 mg        250 mg Oral Daily 12/24/23 1732     12/21/23  1000  azithromycin (ZITHROMAX) 500 mg in sodium chloride 0.9 % 250 mL IVPB        500 mg 250 mL/hr over 60 Minutes Intravenous Every 24 hours 12/20/23 1431 12/23/23 1159   12/21/23 0900  cefTRIAXone (ROCEPHIN) 2 g in sodium chloride 0.9 % 100 mL IVPB   Status:  Discontinued        2 g 200 mL/hr over 30 Minutes Intravenous Every 24 hours 12/20/23 1431 12/24/23 1732   12/20/23 1000  oseltamivir (TAMIFLU) capsule 30 mg        30 mg Oral 2 times daily 12/20/23 0911 12/25/23 0959   12/20/23 0900  cefTRIAXone (ROCEPHIN) 1 g in sodium chloride 0.9 % 100 mL IVPB  Status:  Discontinued        1 g 200 mL/hr over 30 Minutes Intravenous Every 24 hours 12/20/23 0748 12/20/23 1431   12/20/23 0900  azithromycin (ZITHROMAX) 500 mg in sodium chloride 0.9 % 250 mL IVPB  Status:  Discontinued        500 mg 250 mL/hr over 60 Minutes Intravenous Every 24 hours 12/20/23 0748 12/20/23 1431       Significant Hospital Events: Including procedures, antibiotic start and stop dates in addition to other pertinent events   1/25: Admitted by [facility] with COPD Exacerbation, Influenza infection, and Strep Pneumo pneumonia.  Requiring BiPAP 1/26: Remains BiPAP dependent with increased WOB.  High risk for intubation.  PCCM consulted.    Objective   Blood pressure (!) 162/84, pulse 70, temperature 98.1 F (36.7 C), temperature source Oral, resp. rate (!) 22, height 5\' 3"  (1.6 m), weight 61.2 kg, SpO2 99%.    FiO2 (%):  [40 %] 40 %   Intake/Output Summary (Last 24 hours) at 12/25/2023 1622 Last data filed at 12/25/2023 2297 Gross per 24 hour  Intake 288.34 ml  Output --  Net 288.34 ml   Filed Weights   12/23/23 0500 12/24/23 0400 12/25/23 0400  Weight: 61 kg 60.6 kg 61.2 kg    Examination: General: Age appropraite NAD HENT: Atraumatic, normocephalic, neck supple, no JVD Lungs: mild rhonchi b/l Cardiovascular: Tachycardia, regular rhythm, S1-S2, no murmurs, rubs, gallops Abdomen: Soft, nontender, nondistended, no guarding or rebound tenderness, bowel sounds positive x 4 Extremities: Normal bulk and tone, no deformities, no edema, no cyanosis, good peripheral perfusion Neuro: Aaox 3 + anxiety no FND grossly GU: Deferred  Resolved Hospital Problem list      Assessment & Plan:   #Acute on Chronic Hypoxic & Hypercapnic  Respiratory Failure in the setting of Acute COPD Exacerbation, Influenza A Infection, and superimposed Strep Pneumoniae Pneumonia PMHx: COPD requiring 4L supplemental O2 at baseline, tobacco abuse -Supplemental O2 as needed to maintain O2 sats 88 to 92% -Bronchodilators -IV Steroids- reduced dose to prednisone taper -Pulmonary toilet as able  #Sepsis (-PRESENT ON ADMISSION DUE TO PNEUMONIA #Influenza A Infection   #Elevated Troponin, suspect demand ischemia #Tachycardia, suspect compensatory due to respiratory failure Echocardiogram 09/18/20: Normal LVEF, borderline diastolic dysfunction, normal valves -Continuous cardiac monitoring -Maintain MAP >65 -Vasopressors as needed to maintain MAP goal ~ NOT REQUIRING -Lactic acid is normalized -HS Troponin peaked at 502 -BNP 49 on 1/24 -Echocardiogram ordered and pending -Diuresis as BP and renal function permits ~ given 40 mg IV Lasix x1 dose on 1/26  #Hyperglycemia, suspect in setting of acute critical illness and steroids -Check Hgb A1c -CBG's q4h; Target range of 140 to 180 -SSI -Follow ICU Hypo/Hyperglycemia protocol      Best Practice (right  click and "Reselect all SmartList Selections" daily)   Diet/type: NPO until respiratory status improved DVT prophylaxis: LMWH GI prophylaxis: PPI Lines: N/A Foley:  N/A Code Status:  full code Last date of multidisciplinary goals of care discussion [1/26]   Labs   CBC: Recent Labs  Lab 12/19/23 2336 12/20/23 1038 12/21/23 0434 12/22/23 0334 12/23/23 0333 12/24/23 0400 12/25/23 0343  WBC 16.0*   < > 10.1 6.9 4.4 5.6 7.7  NEUTROABS 12.2*  --   --   --   --   --   --   HGB 13.5   < > 11.3* 11.3* 11.4* 11.5* 11.5*  HCT 42.3   < > 35.4* 35.8* 35.5* 36.6 36.1  MCV 85.8   < > 86.3 88.2 84.7 86.3 86.0  PLT 374   < > 260 256 281 297 310   < > = values in this interval not displayed.    Basic Metabolic  Panel: Recent Labs  Lab 12/21/23 0434 12/22/23 0334 12/22/23 1709 12/23/23 0333 12/23/23 0334 12/24/23 0400 12/25/23 0343  NA 139 143  --   --  141 140 140  K 4.0 4.0  --   --  3.5 4.4 4.5  CL 98 98  --   --  97* 95* 96*  CO2 31 34*  --   --  35* 36* 35*  GLUCOSE 122* 133*  --   --  147* 139* 105*  BUN 22 33*  --   --  32* 29* 24*  CREATININE 0.40* 0.50  --   --  0.39* 0.31* 0.35*  CALCIUM 7.9* 8.3*  --   --  8.2* 8.9 9.1  MG  --  2.4  --  2.2  --  2.0  --   PHOS  --  1.8* 4.3  --  2.6 1.9* 2.7   GFR: Estimated Creatinine Clearance (by C-G formula based on SCr of 0.35 mg/dL (L)) Female: 16.1 mL/min (A) Female: 70.1 mL/min (A) Recent Labs  Lab 12/19/23 2336 12/20/23 0715 12/20/23 1038 12/20/23 1316 12/21/23 0434 12/22/23 0334 12/23/23 0333 12/24/23 0400 12/25/23 0343  PROCALCITON  --   --  1.08  --   --  0.63 0.31  --   --   WBC 16.0*  --  10.9*  --    < > 6.9 4.4 5.6 7.7  LATICACIDVEN 0.9 1.3 1.2 0.6  --   --   --   --   --    < > = values in this interval not displayed.    Liver Function Tests: Recent Labs  Lab 12/19/23 2336 12/20/23 1038 12/21/23 0434 12/22/23 0334 12/23/23 0334 12/24/23 0400 12/25/23 0343  AST 29 36 49*  --   --   --   --   ALT 23 23 35  --   --   --   --   ALKPHOS 97 76 75  --   --   --   --   BILITOT 0.6 0.5 0.6  --   --   --   --   PROT 7.8 6.9 6.5  --   --   --   --   ALBUMIN 4.1 3.5 3.1* 3.1* 3.0* 2.8* 2.8*   No results for input(s): "LIPASE", "AMYLASE" in the last 168 hours. No results for input(s): "AMMONIA" in the last 168 hours.  ABG    Component Value Date/Time   HCO3 29.4 (H) 12/20/2023 0159   O2SAT 100 12/20/2023 0159  Coagulation Profile: No results for input(s): "INR", "PROTIME" in the last 168 hours.  Cardiac Enzymes: No results for input(s): "CKTOTAL", "CKMB", "CKMBINDEX", "TROPONINI" in the last 168 hours.  HbA1C: Hemoglobin A1C  Date/Time Value Ref Range Status  12/11/2023 04:01 PM 6.2 (A) 4.0 - 5.6 %  Final   Hgb A1c MFr Bld  Date/Time Value Ref Range Status  05/22/2018 12:02 AM 6.2 (H) 4.8 - 5.6 % Final    Comment:    (NOTE) Pre diabetes:          5.7%-6.4% Diabetes:              >6.4% Glycemic control for   <7.0% adults with diabetes     CBG: Recent Labs  Lab 12/24/23 1158 12/24/23 1635 12/24/23 2142 12/25/23 0804 12/25/23 1146  GLUCAP 141* 164* 132* 78 171*    Review of Systems:   Positives in BOLD: Gen: Denies fever, chills, weight change, fatigue, night sweats HEENT: Denies blurred vision, double vision, hearing loss, tinnitus, sinus congestion, rhinorrhea, sore throat, neck stiffness, dysphagia PULM: Denies shortness of breath, cough, sputum production, hemoptysis, wheezing CV: Denies chest pain, edema, orthopnea, paroxysmal nocturnal dyspnea, palpitations GI: Denies abdominal pain, nausea, vomiting, diarrhea, hematochezia, melena, constipation, change in bowel habits GU: Denies dysuria, hematuria, polyuria, oliguria, urethral discharge Endocrine: Denies hot or cold intolerance, polyuria, polyphagia or appetite change Derm: Denies rash, dry skin, scaling or peeling skin change Heme: Denies easy bruising, bleeding, bleeding gums Neuro: Denies headache, numbness, weakness, slurred speech, loss of memory or consciousness   Past Medical History:  She,  has a past medical history of Chronic respiratory failure with hypoxia (HCC), Cigarette smoker, COPD (chronic obstructive pulmonary disease) (HCC), Dependence on supplemental oxygen, Depression with anxiety, Endometriosis, GERD (gastroesophageal reflux disease), Grade I diastolic dysfunction, Hyperlipidemia, Kidney stones, and Reactive airway disease (03/08/2014).   Surgical History:   Past Surgical History:  Procedure Laterality Date   CATARACT EXTRACTION W/PHACO Right 12/04/2023   Procedure: CATARACT EXTRACTION PHACO AND INTRAOCULAR LENS PLACEMENT (IOC) RIGHT  CLAREON VIVITY LENS;  Surgeon: Estanislado Pandy,  MD;  Location: [facility];  Service: Ophthalmology;  Laterality: Right;  10.40 1:04.0   CATARACT EXTRACTION W/PHACO Left 12/18/2023   Procedure: CATARACT EXTRACTION PHACO AND INTRAOCULAR LENS PLACEMENT (IOC) LEFT  CLAREON VIVITY TORIC 16.28, 01:19.2;  Surgeon: Estanislado Pandy, MD;  Location: [facility];  Service: Ophthalmology;  Laterality: Left;   COLONOSCOPY WITH PROPOFOL N/A 01/02/2023   Procedure: COLONOSCOPY WITH PROPOFOL;  Surgeon: Toney Reil, MD;  Location: [facility];  Service: Gastroenterology;  Laterality: N/A;   ESOPHAGOGASTRODUODENOSCOPY N/A 07/29/2018   Procedure: ESOPHAGOGASTRODUODENOSCOPY (EGD);  Surgeon: Toledo, Boykin Nearing, MD;  Location: [facility] END[facility];  Service: Gastroenterology;  Laterality: N/A;   ESOPHAGOGASTRODUODENOSCOPY (EGD) WITH PROPOFOL N/A 01/02/2023   Procedure: ESOPHAGOGASTRODUODENOSCOPY (EGD) WITH PROPOFOL;  Surgeon: Toney Reil, MD;  Location: [facility];  Service: Gastroenterology;  Laterality: N/A;   KIDNEY STONE SURGERY     TONSILLECTOMY       Social History:   reports that she has been smoking cigarettes. She started smoking about 50 years ago. She has a 26.8 pack-year smoking history. She has never used smokeless tobacco. She reports current alcohol use. She reports that she does not use drugs.   Family History:  Her family history includes GER disease in her father and mother; Heart attack in her father; Hyperlipidemia in her mother; Hypertension in her father and mother; Parkinson's disease in her father.   Allergies Allergies  Allergen Reactions   Naproxen Itching   Sulfa Antibiotics Hives and Rash     Home Medications  Prior to Admission medications   Medication Sig Start Date End Date Taking? Authorizing Provider  ALPRAZolam (XANAX) 0.25 MG tablet Take 1 tablet (0.25 mg total) by mouth 3 (three) times daily as needed for anxiety. 12/11/23  Yes Abernathy, Arlyss Repress, NP  ascorbic acid (VITAMIN C) 500 MG  tablet Take 500 mg by mouth daily.   Yes [provider]  aspirin 81 MG tablet Take 81 mg by mouth at bedtime.    Yes [provider]  azithromycin (ZITHROMAX) 250 MG tablet Use as directed for 5 days. 12/19/23  Yes McDonough, Salomon Fick, PA-C  Cholecalciferol 25 MCG (1000 UT) tablet Take 1,000 Units by mouth daily.    Yes [provider]  Coenzyme Q10 (CO Q-10) 100 MG CAPS Take 200 mg by mouth daily.    Yes [provider]  DULERA 200-5 MCG/ACT AERO INHALE 1 PUFF INTO THE LUNGS TWICE DAILY 05/23/23  Yes Abernathy, Alyssa, NP  DULoxetine (CYMBALTA) 60 MG capsule Take 1 capsule (60 mg total) by mouth daily. 11/02/23  Yes Lyndon Code, MD  famotidine (PEPCID) 40 MG tablet Take 1 tablet (40 mg total) by mouth 2 (two) times daily. 07/02/23  Yes Abernathy, Arlyss Repress, NP  furosemide (LASIX) 40 MG tablet TAKE 1 TABLET BY MOUTH DAILY 02/24/23  Yes Abernathy, Alyssa, NP  gemfibrozil (LOPID) 600 MG tablet Take 1 tablet (600 mg total) by mouth 2 (two) times daily before a meal. 12/11/23  Yes Abernathy, Alyssa, NP  Ipratropium-Albuterol (COMBIVENT RESPIMAT) 20-100 MCG/ACT AERS respimat INHALE 1 PUFF INTO THE LUNGS EVERY 6 HOURS AS NEEDED FOR WHEEZING OR SHORTNESS OF BREATH 10/31/23  Yes Lyndon Code, MD  Magnesium 400 MG TABS Take 400 mg by mouth daily.    Yes [provider]  mometasone (NASONEX) 50 MCG/ACT nasal spray Place 2 sprays into the nose daily.   Yes [provider]  montelukast (SINGULAIR) 10 MG tablet Take 1 tablet (10 mg total) by mouth daily. 01/31/23  Yes Abernathy, Arlyss Repress, NP  Multiple Vitamins-Minerals (MULTIVITAMIN WITH MINERALS) tablet Take 1 tablet by mouth daily.   Yes [provider]  omeprazole (PRILOSEC) 40 MG capsule Take 1 capsule (40 mg total) by mouth 2 (two) times daily. 12/04/23  Yes Abernathy, Arlyss Repress, NP  Potassium Chloride CR (MICRO-K) 8 MEQ CPCR capsule CR Take 1 capsule (8 mEq total) by mouth daily. 10/26/23  Yes Lyndon Code, MD   revefenacin [facility]) 175 MCG/3ML nebulizer solution USE 1 VIAL IN NEBULIZER DAILY 08/11/23  Yes Abernathy, Alyssa, NP  Roflumilast 250 MCG TABS TAKE 1 TABLET BY MOUTH EVERY DAY 11/27/23  Yes Abernathy, Alyssa, NP  simvastatin (ZOCOR) 20 MG tablet TAKE 1 TABLET(20 MG) BY MOUTH EVERY EVENING FOR CHOLESTEROL 11/06/23  Yes Abernathy, Alyssa, NP  SPIRIVA HANDIHALER 18 MCG inhalation capsule Place 1 capsule (18 mcg total) into inhaler and inhale daily. i 12/09/23  Yes Abernathy, Arlyss Repress, NP  vitamin E 180 MG (400 UNITS) capsule Take by mouth.   Yes [provider]  DENTA 5000 PLUS 1.1 % CREA dental cream Take 1 application by mouth daily. 04/14/21   [provider]  OXYGEN Inhale 4 L into the lungs continuous. Continuous oxygen and NIV at night, 2 liters   pt uses [facility] for oxygen supplies    [provider]  Varenicline Tartrate, Starter, 0.5 MG X 11 & 1 MG X 42  TBPK Take 0.5 mg by mouth once daily on days 1-3, then take 0.5 mg twice daily on days 4-7, then increase to 1 mg twice daily Patient not taking: Reported on 12/20/2023 08/13/23   Sallyanne Kuster, NP       Rachael Blankenship, M.D.  Pulmonary & Critical Care Medicine

## 2023-12-26 DIAGNOSIS — J13 Pneumonia due to Streptococcus pneumoniae: Secondary | ICD-10-CM

## 2023-12-26 DIAGNOSIS — R7989 Other specified abnormal findings of blood chemistry: Secondary | ICD-10-CM

## 2023-12-26 DIAGNOSIS — R531 Weakness: Secondary | ICD-10-CM

## 2023-12-26 DIAGNOSIS — J441 Chronic obstructive pulmonary disease with (acute) exacerbation: Secondary | ICD-10-CM | POA: Diagnosis not present

## 2023-12-26 DIAGNOSIS — A419 Sepsis, unspecified organism: Principal | ICD-10-CM

## 2023-12-26 DIAGNOSIS — J9601 Acute respiratory failure with hypoxia: Secondary | ICD-10-CM | POA: Diagnosis not present

## 2023-12-26 DIAGNOSIS — J101 Influenza due to other identified influenza virus with other respiratory manifestations: Secondary | ICD-10-CM | POA: Diagnosis not present

## 2023-12-26 LAB — CBC
HCT: 35.8 % — ABNORMAL LOW (ref 36.0–46.0)
Hemoglobin: 11.5 g/dL — ABNORMAL LOW (ref 12.0–15.0)
MCH: 27.1 pg (ref 26.0–34.0)
MCHC: 32.1 g/dL (ref 30.0–36.0)
MCV: 84.2 fL (ref 80.0–100.0)
Platelets: 367 10*3/uL (ref 150–400)
RBC: 4.25 MIL/uL (ref 3.87–5.11)
RDW: 14.1 % (ref 11.5–15.5)
WBC: 7.4 10*3/uL (ref 4.0–10.5)
nRBC: 0 % (ref 0.0–0.2)

## 2023-12-26 LAB — GLUCOSE, CAPILLARY
Glucose-Capillary: 80 mg/dL (ref 70–99)
Glucose-Capillary: 173 mg/dL — ABNORMAL HIGH (ref 70–99)

## 2023-12-26 LAB — RENAL FUNCTION PANEL
Albumin: 2.7 g/dL — ABNORMAL LOW (ref 3.5–5.0)
Anion gap: 9 (ref 5–15)
BUN: 23 mg/dL (ref 8–23)
CO2: 34 mmol/L — ABNORMAL HIGH (ref 22–32)
Calcium: 8.4 mg/dL — ABNORMAL LOW (ref 8.9–10.3)
Chloride: 97 mmol/L — ABNORMAL LOW (ref 98–111)
Creatinine, Ser: 0.34 mg/dL — ABNORMAL LOW (ref 0.44–1.00)
GFR, Estimated: >60 mL/min (ref >60)
Glucose, Bld: 81 mg/dL (ref 70–99)
Phosphorus: 3.4 mg/dL (ref 2.5–4.6)
Potassium: 4.2 mmol/L (ref 3.5–5.1)
Sodium: 140 mmol/L (ref 135–145)

## 2023-12-26 LAB — CULTURE, BLOOD (ROUTINE X 2): Culture: NO GROWTH

## 2023-12-26 MED ORDER — ENSURE ENLIVE PO LIQD: 237.0000 mL | Freq: Two times a day (BID) | ORAL | 12 refills | Status: DC

## 2023-12-26 MED ORDER — METOPROLOL TARTRATE 25 MG PO TABS: 25.0000 mg | ORAL_TABLET | Freq: Two times a day (BID) | ORAL | 1 refills | Status: DC

## 2023-12-26 MED ORDER — CHLORHEXIDINE GLUCONATE CLOTH 2 % EX PADS: 6.0000 | MEDICATED_PAD | Freq: Every day | CUTANEOUS | Status: DC

## 2023-12-26 MED ORDER — PREDNISONE 10 MG PO TABS: ORAL_TABLET | ORAL | 0 refills | Status: AC

## 2023-12-26 MED ORDER — NICOTINE 14 MG/24HR TD PT24: 14.0000 mg | MEDICATED_PATCH | Freq: Every day | TRANSDERMAL | 0 refills | Status: DC

## 2023-12-26 NOTE — Plan of Care (Signed)
  Problem: Coping: Goal: Ability to adjust to condition or change in health will improve Outcome: Progressing   Problem: Health Behavior/Discharge Planning: Goal: Ability to identify and utilize available resources and services will improve Outcome: Progressing   Problem: Clinical Measurements: Goal: Respiratory complications will improve Outcome: Progressing

## 2023-12-26 NOTE — Progress Notes (Signed)
NAME:  Rachael Blankenship, MRN:  [id number], DOB:  09-18-1956, LOS: 6 ADMISSION DATE:  12/19/2023, CONSULTATION DATE:  12/21/2023 REFERRING MD:  Dr. Georgeann Oppenheim, CHIEF COMPLAINT:  Acute on Chronic Hypoxic Respiratory Failure   Brief Pt Description / Synopsis:  68 y.o. female admitted with Acute on Chronic Hypoxic & Hypercapnic Respiratory Failure in the setting of Acute COPD Exacerbation, Influenza A Infection, and superimposed Strep Pneumoniae Pneumonia requiring BiPAP.  High risk for intubation.  History of Present Illness:  Rachael Blankenship is a 68 y.o. female with a past medical history of COPD and chronic hypoxic respiratory failure on 4 L baseline who presented to the ED with chief complaint of severe respiratory distress with associated fever and cough.  Pt currently with respiratory distress on BiPAP, therefore history is obtained from chart review.  Per H&P from Hospitalists, she had progressive cough and shortness of breath over several days.  She had minimal relief with nebulizer treatments and Azithromycin at home.  Upon EMS arrival she noted to have severe respiratory distress of which she was placed on non-rebreather mask and  given magnesium, solumedrol and nebulizers.  In group she became transiently unresponsive and required placement of BiPAP on upon arrival.  ED Course: Initial Vital Signs: Temperature 99 Fahrenheit orally, pulse 126, respiratory rate 25, blood pressure 186/111, SpO2 100% on nonrebreather Significant Labs: Glucose 273, BNP 49, high-sensitivity troponin peaked at 582, lactic acid 0.9, WBC 16.0, procalcitonin 1.08 Positive for influenza A Strep pneumo urinary antigen positive Imaging Chest X-ray>>IMPRESSION: 1. No active disease. 2. Aortic Atherosclerosis    She was admitted by the hospitalist for further workup and treatment.  Please see significant hospital events section below for full detailed hospital course.  12/22/23- for TRH today, reduced phlegm, off bipap  today.  Procalcitonin is improved. CBC stable , bmp with electrolyte derrangements with pharmacy consultation for repletion.  12/23/23- patient rquired bipap overnight with lasix.  She still has labored breathing. Reports air hunger did improve with dose of narcotics overnight. Diet has been started.  Will continue to follow from pulmonary perspective.  12/24/23- Patient slowly improving, she reports GERD related chest discomfort, her breathing is more stable.  She reports panic attacks and asks for extra dose of xanax to be delivered.  12/25/23-patient seen at bedside. Husband wanted to meet with me and  we discussed patients chronic pulmonary disease. Hse does have outpatient CPAP.  She has significant anxiety and depression which is affecting her overall prognosis.  She is physically deconditioned and would benefit from pulmonary rehab.  12/26/23- patient is cleared for dc home with outpatient pulmonary follow up within 2wk  Pertinent  Medical History   Past Medical History:  Diagnosis Date   Chronic respiratory failure with hypoxia (HCC)    Cigarette smoker    COPD (chronic obstructive pulmonary disease) (HCC)    Dependence on supplemental oxygen    Depression with anxiety    Endometriosis    GERD (gastroesophageal reflux disease)    Grade I diastolic dysfunction    Hyperlipidemia    Kidney stones    Reactive airway disease 03/08/2014    Micro Data:  1/24: COVID/RSV/FLU PCR>>  + Influenza A 1/24: Blood culture x2>> no growth to date 1/25: Strep Pneumo urinary antigen + 1/25: HIV Screen>> nonreactive  Antimicrobials:   Anti-infectives (From admission, onward)    Start     Dose/Rate Route Frequency Ordered Stop   12/24/23 1830  azithromycin (ZITHROMAX) tablet 250 mg  250 mg Oral Daily 12/24/23 1732     12/21/23 1000  azithromycin (ZITHROMAX) 500 mg in sodium chloride 0.9 % 250 mL IVPB        500 mg 250 mL/hr over 60 Minutes Intravenous Every 24 hours 12/20/23 1431 12/23/23  1159   12/21/23 0900  cefTRIAXone (ROCEPHIN) 2 g in sodium chloride 0.9 % 100 mL IVPB  Status:  Discontinued        2 g 200 mL/hr over 30 Minutes Intravenous Every 24 hours 12/20/23 1431 12/24/23 1732   12/20/23 1000  oseltamivir (TAMIFLU) capsule 30 mg        30 mg Oral 2 times daily 12/20/23 0911 12/25/23 0959   12/20/23 0900  cefTRIAXone (ROCEPHIN) 1 g in sodium chloride 0.9 % 100 mL IVPB  Status:  Discontinued        1 g 200 mL/hr over 30 Minutes Intravenous Every 24 hours 12/20/23 0748 12/20/23 1431   12/20/23 0900  azithromycin (ZITHROMAX) 500 mg in sodium chloride 0.9 % 250 mL IVPB  Status:  Discontinued        500 mg 250 mL/hr over 60 Minutes Intravenous Every 24 hours 12/20/23 0748 12/20/23 1431       Significant Hospital Events: Including procedures, antibiotic start and stop dates in addition to other pertinent events   1/25: Admitted by [facility] with COPD Exacerbation, Influenza infection, and Strep Pneumo pneumonia.  Requiring BiPAP 1/26: Remains BiPAP dependent with increased WOB.  High risk for intubation.  PCCM consulted.    Objective   Blood pressure (!) 140/79, pulse 77, temperature 98 F (36.7 C), temperature source Oral, resp. rate 18, height 5\' 3"  (1.6 m), weight 58.5 kg, SpO2 92%.    FiO2 (%):  [40 %] 40 %   Intake/Output Summary (Last 24 hours) at 12/26/2023 1218 Last data filed at 12/25/2023 1700 Gross per 24 hour  Intake 120 ml  Output --  Net 120 ml   Filed Weights   12/24/23 0400 12/25/23 0400 12/26/23 0500  Weight: 60.6 kg 61.2 kg 58.5 kg    Examination: General: Age appropraite NAD HENT: Atraumatic, normocephalic, neck supple, no JVD Lungs: mild rhonchi b/l Cardiovascular: Tachycardia, regular rhythm, S1-S2, no murmurs, rubs, gallops Abdomen: Soft, nontender, nondistended, no guarding or rebound tenderness, bowel sounds positive x 4 Extremities: Normal bulk and tone, no deformities, no edema, no cyanosis, good peripheral perfusion Neuro: Aaox 3 +  anxiety no FND grossly GU: Deferred  Resolved Hospital Problem list     Assessment & Plan:   #Acute on Chronic Hypoxic & Hypercapnic  Respiratory Failure in the setting of Acute COPD Exacerbation, Influenza A Infection, and superimposed Strep Pneumoniae Pneumonia PMHx: COPD requiring 4L supplemental O2 at baseline, tobacco abuse -Supplemental O2 as needed to maintain O2 sats 88 to 92% -Bronchodilators -IV Steroids- reduced dose to prednisone taper -Pulmonary toilet as able  #Sepsis (-PRESENT ON ADMISSION DUE TO PNEUMONIA #Influenza A Infection   #Elevated Troponin, suspect demand ischemia #Tachycardia, suspect compensatory due to respiratory failure Echocardiogram 09/18/20: Normal LVEF, borderline diastolic dysfunction, normal valves -Continuous cardiac monitoring -Maintain MAP >65 -Vasopressors as needed to maintain MAP goal ~ NOT REQUIRING -Lactic acid is normalized -HS Troponin peaked at 502 -BNP 49 on 1/24 -Echocardiogram ordered and pending -Diuresis as BP and renal function permits ~ given 40 mg IV Lasix x1 dose on 1/26  #Hyperglycemia, suspect in setting of acute critical illness and steroids -Check Hgb A1c -CBG's q4h; Target range of 140 to 180 -SSI -Follow ICU  Hypo/Hyperglycemia protocol      Best Practice (right click and "Reselect all SmartList Selections" daily)   Diet/type: NPO until respiratory status improved DVT prophylaxis: LMWH GI prophylaxis: PPI Lines: N/A Foley:  N/A Code Status:  full code Last date of multidisciplinary goals of care discussion [1/26]   Labs   CBC: Recent Labs  Lab 12/19/23 2336 12/20/23 1038 12/22/23 0334 12/23/23 0333 12/24/23 0400 12/25/23 0343 12/26/23 0502  WBC 16.0*   < > 6.9 4.4 5.6 7.7 7.4  NEUTROABS 12.2*  --   --   --   --   --   --   HGB 13.5   < > 11.3* 11.4* 11.5* 11.5* 11.5*  HCT 42.3   < > 35.8* 35.5* 36.6 36.1 35.8*  MCV 85.8   < > 88.2 84.7 86.3 86.0 84.2  PLT 374   < > 256 281 297 310 367   <  > = values in this interval not displayed.    Basic Metabolic Panel: Recent Labs  Lab 12/22/23 0334 12/22/23 1709 12/23/23 0333 12/23/23 0334 12/24/23 0400 12/25/23 0343 12/26/23 0502  NA 143  --   --  141 140 140 140  K 4.0  --   --  3.5 4.4 4.5 4.2  CL 98  --   --  97* 95* 96* 97*  CO2 34*  --   --  35* 36* 35* 34*  GLUCOSE 133*  --   --  147* 139* 105* 81  BUN 33*  --   --  32* 29* 24* 23  CREATININE 0.50  --   --  0.39* 0.31* 0.35* 0.34*  CALCIUM 8.3*  --   --  8.2* 8.9 9.1 8.4*  MG 2.4  --  2.2  --  2.0  --   --   PHOS 1.8* 4.3  --  2.6 1.9* 2.7 3.4   GFR: Estimated Creatinine Clearance (by C-G formula based on SCr of 0.34 mg/dL (L)) Female: 16.1 mL/min (A) Female: 70.1 mL/min (A) Recent Labs  Lab 12/19/23 2336 12/20/23 0715 12/20/23 1038 12/20/23 1316 12/21/23 0434 12/22/23 0334 12/23/23 0333 12/24/23 0400 12/25/23 0343 12/26/23 0502  PROCALCITON  --   --  1.08  --   --  0.63 0.31  --   --   --   WBC 16.0*  --  10.9*  --    < > 6.9 4.4 5.6 7.7 7.4  LATICACIDVEN 0.9 1.3 1.2 0.6  --   --   --   --   --   --    < > = values in this interval not displayed.    Liver Function Tests: Recent Labs  Lab 12/19/23 2336 12/20/23 1038 12/21/23 0434 12/22/23 0334 12/23/23 0334 12/24/23 0400 12/25/23 0343 12/26/23 0502  AST 29 36 49*  --   --   --   --   --   ALT 23 23 35  --   --   --   --   --   ALKPHOS 97 76 75  --   --   --   --   --   BILITOT 0.6 0.5 0.6  --   --   --   --   --   PROT 7.8 6.9 6.5  --   --   --   --   --   ALBUMIN 4.1 3.5 3.1* 3.1* 3.0* 2.8* 2.8* 2.7*   No results for input(s): "LIPASE", "AMYLASE" in the last 168 hours.  No results for input(s): "AMMONIA" in the last 168 hours.  ABG    Component Value Date/Time   HCO3 29.4 (H) 12/20/2023 0159   O2SAT 100 12/20/2023 0159     Coagulation Profile: No results for input(s): "INR", "PROTIME" in the last 168 hours.  Cardiac Enzymes: No results for input(s): "CKTOTAL", "CKMB", "CKMBINDEX",  "TROPONINI" in the last 168 hours.  HbA1C: Hemoglobin A1C  Date/Time Value Ref Range Status  12/11/2023 04:01 PM 6.2 (A) 4.0 - 5.6 % Final   Hgb A1c MFr Bld  Date/Time Value Ref Range Status  05/22/2018 12:02 AM 6.2 (H) 4.8 - 5.6 % Final    Comment:    (NOTE) Pre diabetes:          5.7%-6.4% Diabetes:              >6.4% Glycemic control for   <7.0% adults with diabetes     CBG: Recent Labs  Lab 12/25/23 1146 12/25/23 1735 12/25/23 2101 12/26/23 0731 12/26/23 1148  GLUCAP 171* 134* 122* 80 173*    Review of Systems:   Positives in BOLD: Gen: Denies fever, chills, weight change, fatigue, night sweats HEENT: Denies blurred vision, double vision, hearing loss, tinnitus, sinus congestion, rhinorrhea, sore throat, neck stiffness, dysphagia PULM: Denies shortness of breath, cough, sputum production, hemoptysis, wheezing CV: Denies chest pain, edema, orthopnea, paroxysmal nocturnal dyspnea, palpitations GI: Denies abdominal pain, nausea, vomiting, diarrhea, hematochezia, melena, constipation, change in bowel habits GU: Denies dysuria, hematuria, polyuria, oliguria, urethral discharge Endocrine: Denies hot or cold intolerance, polyuria, polyphagia or appetite change Derm: Denies rash, dry skin, scaling or peeling skin change Heme: Denies easy bruising, bleeding, bleeding gums Neuro: Denies headache, numbness, weakness, slurred speech, loss of memory or consciousness   Past Medical History:  She,  has a past medical history of Chronic respiratory failure with hypoxia (HCC), Cigarette smoker, COPD (chronic obstructive pulmonary disease) (HCC), Dependence on supplemental oxygen, Depression with anxiety, Endometriosis, GERD (gastroesophageal reflux disease), Grade I diastolic dysfunction, Hyperlipidemia, Kidney stones, and Reactive airway disease (03/08/2014).   Surgical History:   Past Surgical History:  Procedure Laterality Date   CATARACT EXTRACTION W/PHACO Right 12/04/2023    Procedure: CATARACT EXTRACTION PHACO AND INTRAOCULAR LENS PLACEMENT (IOC) RIGHT  CLAREON VIVITY LENS;  Surgeon: Estanislado Pandy, MD;  Location: [facility];  Service: Ophthalmology;  Laterality: Right;  10.40 1:04.0   CATARACT EXTRACTION W/PHACO Left 12/18/2023   Procedure: CATARACT EXTRACTION PHACO AND INTRAOCULAR LENS PLACEMENT (IOC) LEFT  CLAREON VIVITY TORIC 16.28, 01:19.2;  Surgeon: Estanislado Pandy, MD;  Location: [facility];  Service: Ophthalmology;  Laterality: Left;   COLONOSCOPY WITH PROPOFOL N/A 01/02/2023   Procedure: COLONOSCOPY WITH PROPOFOL;  Surgeon: Toney Reil, MD;  Location: [facility];  Service: Gastroenterology;  Laterality: N/A;   ESOPHAGOGASTRODUODENOSCOPY N/A 07/29/2018   Procedure: ESOPHAGOGASTRODUODENOSCOPY (EGD);  Surgeon: Toledo, Boykin Nearing, MD;  Location: [facility] END[facility];  Service: Gastroenterology;  Laterality: N/A;   ESOPHAGOGASTRODUODENOSCOPY (EGD) WITH PROPOFOL N/A 01/02/2023   Procedure: ESOPHAGOGASTRODUODENOSCOPY (EGD) WITH PROPOFOL;  Surgeon: Toney Reil, MD;  Location: [facility] END[facility];  Service: Gastroenterology;  Laterality: N/A;   KIDNEY STONE SURGERY     TONSILLECTOMY       Social History:   reports that she has been smoking cigarettes. She started smoking about 50 years ago. She has a 26.8 pack-year smoking history. She has never used smokeless tobacco. She reports current alcohol use. She reports that she does not use drugs.   Family History:  Her family history includes GER disease in her father and mother; Heart attack in her father; Hyperlipidemia in her mother; Hypertension in her father and mother; Parkinson's disease in her father.   Allergies Allergies  Allergen Reactions   Naproxen Itching   Sulfa Antibiotics Hives and Rash     Home Medications  Prior to Admission medications   Medication Sig Start Date End Date Taking? Authorizing Provider  ALPRAZolam (XANAX) 0.25 MG tablet Take 1 tablet (0.25 mg  total) by mouth 3 (three) times daily as needed for anxiety. 12/11/23  Yes Abernathy, Arlyss Repress, NP  ascorbic acid (VITAMIN C) 500 MG tablet Take 500 mg by mouth daily.   Yes [provider]  aspirin 81 MG tablet Take 81 mg by mouth at bedtime.    Yes [provider]  azithromycin (ZITHROMAX) 250 MG tablet Use as directed for 5 days. 12/19/23  Yes McDonough, Salomon Fick, PA-C  Cholecalciferol 25 MCG (1000 UT) tablet Take 1,000 Units by mouth daily.    Yes [provider]  Coenzyme Q10 (CO Q-10) 100 MG CAPS Take 200 mg by mouth daily.    Yes [provider]  DULERA 200-5 MCG/ACT AERO INHALE 1 PUFF INTO THE LUNGS TWICE DAILY 05/23/23  Yes Abernathy, Alyssa, NP  DULoxetine (CYMBALTA) 60 MG capsule Take 1 capsule (60 mg total) by mouth daily. 11/02/23  Yes Lyndon Code, MD  famotidine (PEPCID) 40 MG tablet Take 1 tablet (40 mg total) by mouth 2 (two) times daily. 07/02/23  Yes Abernathy, Arlyss Repress, NP  furosemide (LASIX) 40 MG tablet TAKE 1 TABLET BY MOUTH DAILY 02/24/23  Yes Abernathy, Alyssa, NP  gemfibrozil (LOPID) 600 MG tablet Take 1 tablet (600 mg total) by mouth 2 (two) times daily before a meal. 12/11/23  Yes Abernathy, Alyssa, NP  Ipratropium-Albuterol (COMBIVENT RESPIMAT) 20-100 MCG/ACT AERS respimat INHALE 1 PUFF INTO THE LUNGS EVERY 6 HOURS AS NEEDED FOR WHEEZING OR SHORTNESS OF BREATH 10/31/23  Yes Lyndon Code, MD  Magnesium 400 MG TABS Take 400 mg by mouth daily.    Yes [provider]  mometasone (NASONEX) 50 MCG/ACT nasal spray Place 2 sprays into the nose daily.   Yes [provider]  montelukast (SINGULAIR) 10 MG tablet Take 1 tablet (10 mg total) by mouth daily. 01/31/23  Yes Abernathy, Arlyss Repress, NP  Multiple Vitamins-Minerals (MULTIVITAMIN WITH MINERALS) tablet Take 1 tablet by mouth daily.   Yes [provider]  omeprazole (PRILOSEC) 40 MG capsule Take 1 capsule (40 mg total) by mouth 2 (two) times daily. 12/04/23  Yes Abernathy, Arlyss Repress, NP   Potassium Chloride CR (MICRO-K) 8 MEQ CPCR capsule CR Take 1 capsule (8 mEq total) by mouth daily. 10/26/23  Yes Lyndon Code, MD  revefenacin [facility]) 175 MCG/3ML nebulizer solution USE 1 VIAL IN NEBULIZER DAILY 08/11/23  Yes Abernathy, Alyssa, NP  Roflumilast 250 MCG TABS TAKE 1 TABLET BY MOUTH EVERY DAY 11/27/23  Yes Abernathy, Alyssa, NP  simvastatin (ZOCOR) 20 MG tablet TAKE 1 TABLET(20 MG) BY MOUTH EVERY EVENING FOR CHOLESTEROL 11/06/23  Yes Abernathy, Alyssa, NP  SPIRIVA HANDIHALER 18 MCG inhalation capsule Place 1 capsule (18 mcg total) into inhaler and inhale daily. i 12/09/23  Yes Abernathy, Arlyss Repress, NP  vitamin E 180 MG (400 UNITS) capsule Take by mouth.   Yes [provider]  DENTA 5000 PLUS 1.1 % CREA dental cream Take 1 application by mouth daily. 04/14/21   [provider]  OXYGEN Inhale 4 L into the lungs  continuous. Continuous oxygen and NIV at night, 2 liters   pt uses [facility] for oxygen supplies    [provider]  Varenicline Tartrate, Starter, 0.5 MG X 11 & 1 MG X 42 TBPK Take 0.5 mg by mouth once daily on days 1-3, then take 0.5 mg twice daily on days 4-7, then increase to 1 mg twice daily Patient not taking: Reported on 12/20/2023 08/13/23   Sallyanne Kuster, NP       Vida Rigger, M.D.  Pulmonary & Critical Care Medicine

## 2023-12-26 NOTE — Progress Notes (Signed)
0800 patient alert x4 able to make needs known 4l [place] patient up to chair for breakfast

## 2023-12-26 NOTE — Discharge Summary (Addendum)
Physician Discharge Summary   Patient: Rachael Blankenship MRN: [id number] DOB: Nov 02, 1956  Admit date:     12/19/2023  Discharge date: 12/26/23  Discharge Physician: Marcelino Duster   PCP: Lyndon Code, MD   Recommendations at discharge:    PCP follow up in 1 week. Pulmonology clinic follow up as scheduled.  Discharge Diagnoses: Principal Problem:   Acute hypoxic on chronic hypercapnic respiratory failure (HCC) Active Problems:   COPD with acute exacerbation (HCC)   Influenza A   Elevated troponin   Sepsis (HCC)   Streptococcus pneumoniae pneumonia (HCC)  Resolved Problems:   * No resolved hospital problems. *  Hospital Course: Rachael Blankenship is a 68 y.o. female with a past medical history of COPD and chronic hypoxic respiratory failure on 4 L baseline who presented to the ED with chief complaint of severe respiratory distress with associated fever and cough. [facility] and later transferred to ICU as she is requiring BiPAP and is at high risk for intubation. Patient tested positive for influenza A infection and superimposed strep pneumonia pneumonia. Patient was placed on BiPAP and ICU, continued on ceftriaxone, azithromycin, steroids, bronchodilators. She did improve and is currently on 4 L chronic oxygen transferred to [facility] service 12/23/2023.   Assessment and Plan: Acute on chronic hypoxic and hypercapnic respiratory failure Acute COPD exacerbation Continue supplemental oxygen to maintain saturation greater than 90%. Continue bronchodilators. Bipap while sleeping. Continue prednisone taper as prescribed. Encourage incentive spirometry, flutter valve. Out of bed to chair. Pulmonology follow up with Dr. Karna Christmas as scheduled.   Sepsis present on admission Influenza A infection Strep pneumonia pneumonia Fever improved. Vitals stable. Blood cultures so far negative. Finished Tamiflu for 5 days. Received azithromycin and ceftriaxone  therapy.   Elevated troponin Possibly type II MI due to demand ischemia. Echocardiogram showed normal EF, borderline diastolic dysfunction. She got 1 dose 40 mg IV Lasix. Started metoprolol 25mg  bid for tachycardia, and BP. Monitor daily weights, strict input and output.   Hyperglycemia in the setting of steroids, acute illness. A1c 6.2. Continue Accu-Cheks, sliding scale insulin. Continue to taper steroids. Hypoglycemia protocol.   Generalized weakness- Continue out of bed. Encourage oral diet, supplements.       Consultants: PCCM. Procedures performed: none  Disposition: Home Diet recommendation:  Discharge Diet Orders (From admission, onward)     Start     Ordered   12/26/23 0000  Diet - low sodium heart healthy        12/26/23 1243           Cardiac diet DISCHARGE MEDICATION: Allergies as of 12/26/2023       Reactions   Naproxen Itching   Sulfa Antibiotics Hives, Rash        Medication List     STOP taking these medications    omeprazole 40 MG capsule Commonly known as: PRILOSEC       TAKE these medications    ALPRAZolam 0.25 MG tablet Commonly known as: XANAX Take 1 tablet (0.25 mg total) by mouth 3 (three) times daily as needed for anxiety.   ascorbic acid 500 MG tablet Commonly known as: VITAMIN C Take 500 mg by mouth daily.   aspirin 81 MG tablet Take 81 mg by mouth at bedtime.   azithromycin 250 MG tablet Commonly known as: ZITHROMAX Use as directed for 5 days.   Cholecalciferol 25 MCG (1000 UT) tablet Take 1,000 Units by mouth daily.   Co Q-10 100 MG Caps  Take 200 mg by mouth daily.   Combivent Respimat 20-100 MCG/ACT Aers respimat Generic drug: Ipratropium-Albuterol INHALE 1 PUFF INTO THE LUNGS EVERY 6 HOURS AS NEEDED FOR WHEEZING OR SHORTNESS OF BREATH   Denta 5000 Plus 1.1 % Crea dental cream Generic drug: sodium fluoride Take 1 application by mouth daily.   Dulera 200-5 MCG/ACT Aero Generic drug:  mometasone-formoterol INHALE 1 PUFF INTO THE LUNGS TWICE DAILY   DULoxetine 60 MG capsule Commonly known as: CYMBALTA Take 1 capsule (60 mg total) by mouth daily.   famotidine 40 MG tablet Commonly known as: PEPCID Take 1 tablet (40 mg total) by mouth 2 (two) times daily.   feeding supplement Liqd Take 237 mLs by mouth 2 (two) times daily between meals.   furosemide 40 MG tablet Commonly known as: LASIX TAKE 1 TABLET BY MOUTH DAILY   gemfibrozil 600 MG tablet Commonly known as: LOPID Take 1 tablet (600 mg total) by mouth 2 (two) times daily before a meal.   Magnesium 400 MG Tabs Take 400 mg by mouth daily.   metoprolol tartrate 25 MG tablet Commonly known as: LOPRESSOR Take 1 tablet (25 mg total) by mouth 2 (two) times daily.   mometasone 50 MCG/ACT nasal spray Commonly known as: NASONEX Place 2 sprays into the nose daily.   montelukast 10 MG tablet Commonly known as: SINGULAIR Take 1 tablet (10 mg total) by mouth daily.   multivitamin with minerals tablet Take 1 tablet by mouth daily.   nicotine 14 mg/24hr patch Commonly known as: NICODERM CQ - dosed in mg/24 hours Place 1 patch (14 mg total) onto the skin daily. Start taking on: December 27, 2023   OXYGEN Inhale 4 L into the lungs continuous. Continuous oxygen and NIV at night, 2 liters   pt uses [facility] for oxygen supplies   Potassium Chloride CR 8 MEQ Cpcr capsule CR Commonly known as: MICRO-K Take 1 capsule (8 mEq total) by mouth daily.   predniSONE 10 MG tablet Commonly known as: DELTASONE Take 4 tablets (40 mg total) by mouth daily with breakfast for 3 days, THEN 3 tablets (30 mg total) daily with breakfast for 3 days, THEN 2 tablets (20 mg total) daily with breakfast for 3 days, THEN 1 tablet (10 mg total) daily with breakfast for 3 days. Start taking on: December 26, 2023   Roflumilast 250 MCG Tabs TAKE 1 TABLET BY MOUTH EVERY DAY   simvastatin 20 MG tablet Commonly known as: ZOCOR TAKE 1 TABLET(20 MG)  BY MOUTH EVERY EVENING FOR CHOLESTEROL   Spiriva HandiHaler 18 MCG inhalation capsule Generic drug: tiotropium Place 1 capsule (18 mcg total) into inhaler and inhale daily. i   vitamin E 180 MG (400 UNITS) capsule Take by mouth.   Yupelri 175 MCG/3ML nebulizer solution Generic drug: revefenacin USE 1 VIAL IN NEBULIZER DAILY        Discharge Exam: Filed Weights   12/24/23 0400 12/25/23 0400 12/26/23 0500  Weight: 60.6 kg 61.2 kg 58.5 kg   General - Elderly ill Caucasian female, mild respiratory distress HEENT - PERRLA, EOMI, atraumatic head, non tender sinuses. Lung - Clear, diffuse rales, basilar rhonchi, wheezes. Heart - S1, S2 heard, tachycardia, trace pedal edema. Abdomen - Soft, non tender, nondistended, bowel sounds good Neuro - Alert, awake and oriented x 3, non focal exam. Skin - Warm and dry.  Condition at discharge: stable  The results of significant diagnostics from this hospitalization (including imaging, microbiology, ancillary and laboratory) are listed below for reference.  Imaging Studies: ECHOCARDIOGRAM COMPLETE Result Date: 12/22/2023    ECHOCARDIOGRAM REPORT   Patient Name:   DESTANIE TIBBETTS Date of Exam: 12/21/2023 Medical Rec #:  [id number]       Height:       63.0 in Accession #:    [id number]      Weight:       131.4 lb Date of Birth:  [birth year]       BSA:          1.617 m Patient Age:    67 years        BP:           115/54 mmHg Patient Gender: F               HR:           126 bpm. Exam Location:  [facility] Procedure: 2D Echo, Cardiac Doppler and Color Doppler Indications:     Elevated Troponin  History:         Patient has prior history of Echocardiogram examinations, most                  recent 09/18/2020. Pulmonary HTN and COPD, Signs/Symptoms:Chest                  Pain and Shortness of Breath; Risk Factors:Dyslipidemia and                  Current Smoker.  Sonographer:     Lucendia Herrlich RCS Referring Phys:  [id number] Judithe Modest Diagnosing Phys:  Marcina Millard MD IMPRESSIONS  1. Left ventricular ejection fraction, by estimation, is 60 to 65%. The left ventricle has normal function. The left ventricle has no regional wall motion abnormalities. Left ventricular diastolic parameters are consistent with Grade I diastolic dysfunction (impaired relaxation).  2. Right ventricular systolic function is normal. The right ventricular size is normal.  3. The mitral valve is normal in structure. Trivial mitral valve regurgitation. No evidence of mitral stenosis.  4. The aortic valve is normal in structure. Aortic valve regurgitation is not visualized. No aortic stenosis is present.  5. The inferior vena cava is normal in size with greater than 50% respiratory variability, suggesting right atrial pressure of 3 mmHg. FINDINGS  Left Ventricle: Left ventricular ejection fraction, by estimation, is 60 to 65%. The left ventricle has normal function. The left ventricle has no regional wall motion abnormalities. The left ventricular internal cavity size was normal in size. There is  no left ventricular hypertrophy. Left ventricular diastolic parameters are consistent with Grade I diastolic dysfunction (impaired relaxation). Right Ventricle: The right ventricular size is normal. No increase in right ventricular wall thickness. Right ventricular systolic function is normal. Left Atrium: Left atrial size was normal in size. Right Atrium: Right atrial size was normal in size. Pericardium: There is no evidence of pericardial effusion. Mitral Valve: The mitral valve is normal in structure. Trivial mitral valve regurgitation. No evidence of mitral valve stenosis. Tricuspid Valve: The tricuspid valve is normal in structure. Tricuspid valve regurgitation is trivial. No evidence of tricuspid stenosis. Aortic Valve: The aortic valve is normal in structure. Aortic valve regurgitation is not visualized. No aortic stenosis is present. Aortic valve peak gradient measures 14.1 mmHg.  Pulmonic Valve: The pulmonic valve was normal in structure. Pulmonic valve regurgitation is not visualized. No evidence of pulmonic stenosis. Aorta: The aortic root is normal in size and structure. Venous: The inferior vena cava is normal in size with  greater than 50% respiratory variability, suggesting right atrial pressure of 3 mmHg. IAS/Shunts: No atrial level shunt detected by color flow Doppler.  LEFT VENTRICLE PLAX 2D LVIDd:         4.10 cm Diastology LV PW:         0.90 cm LV e' medial:    7.51 cm/s LV IVS:        0.70 cm LV E/e' medial:  12.8                        LV e' lateral:   10.90 cm/s                        LV E/e' lateral: 8.8  RIGHT VENTRICLE            IVC RV S prime:     8.86 cm/s  IVC diam: 2.30 cm TAPSE (M-mode): 1.8 cm AORTIC VALVE AV Vmax:      188.00 cm/s AV Peak Grad: 14.1 mmHg LVOT Vmax:    119.00 cm/s LVOT Vmean:   77.900 cm/s LVOT VTI:     0.185 m MITRAL VALVE MV Area (PHT): 8.25 cm     SHUNTS MV Decel Time: 92 msec      Systemic VTI: 0.18 m MV E velocity: 96.40 cm/s MV A velocity: 114.00 cm/s MV E/A ratio:  0.85 Marcina Millard MD Electronically signed by Marcina Millard MD Signature Date/Time: 12/22/2023/1:28:10 PM    Final    DG Chest Port 1 View Result Date: 12/22/2023 CLINICAL DATA:  Acute respiratory failure with hypoxia and hypercapnia. EXAM: PORTABLE CHEST 1 VIEW COMPARISON:  12/20/2023 FINDINGS: Evidence for underlying emphysema. There may be focal atelectasis at the left lung base but minimal change. No new airspace disease or lung consolidation. Heart and mediastinum are within normal limits. Atherosclerotic calcifications at the aortic arch. Negative for a pneumothorax. No acute bone abnormality. IMPRESSION: Emphysema with possible atelectasis at the left lung base. No new lung findings. Electronically Signed   By: Richarda Overlie M.D.   On: 12/22/2023 09:51   DG Chest Port 1 View Result Date: 12/20/2023 CLINICAL DATA:  [id number] Hypoxia [id number] EXAM: PORTABLE CHEST 1  VIEW COMPARISON:  12/19/2023, 11:30 p.m. FINDINGS: Bilateral lungs appear hyperexpanded and hyperlucent with coarse bronchovascular markings, concerning for underlying COPD. Bilateral lungs otherwise appear clear. No dense consolidation or lung collapse. Bilateral costophrenic angles are clear. Normal cardio-mediastinal silhouette. No acute osseous abnormalities. The soft tissues are within normal limits. IMPRESSION: *No active disease.  Probable COPD. Electronically Signed   By: Jules Schick M.D.   On: 12/20/2023 15:31   DG Chest Portable 1 View Result Date: 12/19/2023 CLINICAL DATA:  Respiratory distress EXAM: PORTABLE CHEST 1 VIEW COMPARISON:  Chest x-ray 12/17/2021, CT chest 11/29/2022 FINDINGS: The heart and mediastinal contours are within normal limits. Atherosclerotic plaque. Flattening of the hemidiaphragms. No focal consolidation. Chronic coarsened interstitial markings with no overt pulmonary edema. No pleural effusion. No pneumothorax. No acute osseous abnormality. IMPRESSION: 1. No active disease. 2. Aortic Atherosclerosis (ICD10-I70.0) and Emphysema (ICD10-J43.9). Electronically Signed   By: Tish Frederickson M.D.   On: 12/19/2023 23:44    Microbiology: Results for orders placed or performed during the hospital encounter of 12/19/23  Resp panel by RT-PCR (RSV, Flu A&B, Covid) Anterior Nasal Swab     Status: Abnormal   Collection Time: 12/19/23 11:34 PM   Specimen: Anterior Nasal Swab  Result Value Ref Range Status  SARS Coronavirus 2 by RT PCR NEGATIVE NEGATIVE Final    Comment: (NOTE) SARS-CoV-2 target nucleic acids are NOT DETECTED.  The SARS-CoV-2 RNA is generally detectable in upper respiratory specimens during the acute phase of infection. The lowest concentration of SARS-CoV-2 viral copies this assay can detect is 138 copies/mL. A negative result does not preclude SARS-Cov-2 infection and should not be used as the sole basis for treatment or other patient management  decisions. A negative result may occur with  improper specimen collection/handling, submission of specimen other than nasopharyngeal swab, presence of viral mutation(s) within the areas targeted by this assay, and inadequate number of viral copies(<138 copies/mL). A negative result must be combined with clinical observations, patient history, and epidemiological information. The expected result is Negative.  Fact Sheet for Patients:  BloggerCourse.com  Fact Sheet for Healthcare Providers:  SeriousBroker.it  This test is no t yet approved or cleared by the [place] FDA and  has been authorized for detection and/or diagnosis of SARS-CoV-2 by FDA under an Emergency Use Authorization (EUA). This EUA will remain  in effect (meaning this test can be used) for the duration of the COVID-19 declaration under Section 564(b)(1) of the Act, 21 U.S.C.section 360bbb-3(b)(1), unless the authorization is terminated  or revoked sooner.       Influenza A by PCR POSITIVE (A) NEGATIVE Final   Influenza B by PCR NEGATIVE NEGATIVE Final    Comment: (NOTE) The Xpert Xpress SARS-CoV-2/FLU/RSV plus assay is intended as an aid in the diagnosis of influenza from Nasopharyngeal swab specimens and should not be used as a sole basis for treatment. Nasal washings and aspirates are unacceptable for Xpert Xpress SARS-CoV-2/FLU/RSV testing.  Fact Sheet for Patients: BloggerCourse.com  Fact Sheet for Healthcare Providers: SeriousBroker.it  This test is not yet approved or cleared by the [place] FDA and has been authorized for detection and/or diagnosis of SARS-CoV-2 by FDA under an Emergency Use Authorization (EUA). This EUA will remain in effect (meaning this test can be used) for the duration of the COVID-19 declaration under Section 564(b)(1) of the Act, 21 U.S.C. section 360bbb-3(b)(1), unless  the authorization is terminated or revoked.     Resp Syncytial Virus by PCR NEGATIVE NEGATIVE Final    Comment: (NOTE) Fact Sheet for Patients: BloggerCourse.com  Fact Sheet for Healthcare Providers: SeriousBroker.it  This test is not yet approved or cleared by the [place] FDA and has been authorized for detection and/or diagnosis of SARS-CoV-2 by FDA under an Emergency Use Authorization (EUA). This EUA will remain in effect (meaning this test can be used) for the duration of the COVID-19 declaration under Section 564(b)(1) of the Act, 21 U.S.C. section 360bbb-3(b)(1), unless the authorization is terminated or revoked.  Performed at [facility], [address] [place], [place] [address]   Blood culture (routine x 2)     Status: None   Collection Time: 12/19/23 11:37 PM   Specimen: BLOOD  Result Value Ref Range Status   Specimen Description BLOOD BLOOD RIGHT ARM  Final   Special Requests   Final    BOTTLES DRAWN AEROBIC AND ANAEROBIC Blood Culture results may not be optimal due to an inadequate volume of blood received in culture bottles   Culture   Final    NO GROWTH 5 DAYS Performed at [facility], [address], [place], [place] [address]    Report Status 12/24/2023 FINAL  Final  Blood culture (routine x 2)     Status: None  Collection Time: 12/21/23 12:13 PM   Specimen: BLOOD  Result Value Ref Range Status   Specimen Description BLOOD RAC  Final   Special Requests   Final    BOTTLES DRAWN AEROBIC AND ANAEROBIC Blood Culture results may not be optimal due to an inadequate volume of blood received in culture bottles   Culture   Final    NO GROWTH 5 DAYS Performed at [facility], [address] [place], [place] [address]    Report Status 12/26/2023 FINAL  Final  MRSA Next Gen by PCR, Nasal     Status: None   Collection Time: 12/21/23  7:00 PM   Specimen: Nasal Mucosa; Nasal  Swab  Result Value Ref Range Status   MRSA by PCR Next Gen NOT DETECTED NOT DETECTED Final    Comment: (NOTE) The GeneXpert MRSA Assay (FDA approved for NASAL specimens only), is one component of a comprehensive MRSA colonization surveillance program. It is not intended to diagnose MRSA infection nor to guide or monitor treatment for MRSA infections. Test performance is not FDA approved in patients less than 68 years old. Performed at [facility], [address] [place], [place] [address]   Respiratory (~20 pathogens) panel by PCR     Status: Abnormal   Collection Time: 12/22/23 12:03 PM   Specimen: Nasopharyngeal Swab; Respiratory  Result Value Ref Range Status   Adenovirus NOT DETECTED NOT DETECTED Final   Coronavirus 229E NOT DETECTED NOT DETECTED Final    Comment: (NOTE) The Coronavirus on the Respiratory Panel, DOES NOT test for the novel  Coronavirus (2019 nCoV)    Coronavirus HKU1 NOT DETECTED NOT DETECTED Final   Coronavirus NL63 NOT DETECTED NOT DETECTED Final   Coronavirus OC43 NOT DETECTED NOT DETECTED Final   Metapneumovirus NOT DETECTED NOT DETECTED Final   Rhinovirus / Enterovirus NOT DETECTED NOT DETECTED Final   Influenza A H1 2009 DETECTED (A) NOT DETECTED Final   Influenza B NOT DETECTED NOT DETECTED Final   Parainfluenza Virus 1 NOT DETECTED NOT DETECTED Final   Parainfluenza Virus 2 NOT DETECTED NOT DETECTED Final   Parainfluenza Virus 3 NOT DETECTED NOT DETECTED Final   Parainfluenza Virus 4 NOT DETECTED NOT DETECTED Final   Respiratory Syncytial Virus NOT DETECTED NOT DETECTED Final   Bordetella pertussis NOT DETECTED NOT DETECTED Final   Bordetella Parapertussis NOT DETECTED NOT DETECTED Final   Chlamydophila pneumoniae NOT DETECTED NOT DETECTED Final   Mycoplasma pneumoniae NOT DETECTED NOT DETECTED Final    Comment: Performed at [facility], [address] [place], [place] [address]    Labs: CBC: Recent Labs  Lab 12/19/23 2336  12/20/23 1038 12/22/23 0334 12/23/23 0333 12/24/23 0400 12/25/23 0343 12/26/23 0502  WBC 16.0*   < > 6.9 4.4 5.6 7.7 7.4  NEUTROABS 12.2*  --   --   --   --   --   --   HGB 13.5   < > 11.3* 11.4* 11.5* 11.5* 11.5*  HCT 42.3   < > 35.8* 35.5* 36.6 36.1 35.8*  MCV 85.8   < > 88.2 84.7 86.3 86.0 84.2  PLT 374   < > 256 281 297 310 367   < > = values in this interval not displayed.   Basic Metabolic Panel: Recent Labs  Lab 12/22/23 0334 12/22/23 1709 12/23/23 0333 12/23/23 0334 12/24/23 0400 12/25/23 0343 12/26/23 0502  NA 143  --   --  141 140 140 140  K 4.0  --   --  3.5 4.4 4.5 4.2  CL 98  --   --  97* 95* 96* 97*  CO2 34*  --   --  35* 36* 35* 34*  GLUCOSE 133*  --   --  147* 139* 105* 81  BUN 33*  --   --  32* 29* 24* 23  CREATININE 0.50  --   --  0.39* 0.31* 0.35* 0.34*  CALCIUM 8.3*  --   --  8.2* 8.9 9.1 8.4*  MG 2.4  --  2.2  --  2.0  --   --   PHOS 1.8* 4.3  --  2.6 1.9* 2.7 3.4   Liver Function Tests: Recent Labs  Lab 12/19/23 2336 12/20/23 1038 12/21/23 0434 12/22/23 0334 12/23/23 0334 12/24/23 0400 12/25/23 0343 12/26/23 0502  AST 29 36 49*  --   --   --   --   --   ALT 23 23 35  --   --   --   --   --   ALKPHOS 97 76 75  --   --   --   --   --   BILITOT 0.6 0.5 0.6  --   --   --   --   --   PROT 7.8 6.9 6.5  --   --   --   --   --   ALBUMIN 4.1 3.5 3.1* 3.1* 3.0* 2.8* 2.8* 2.7*   CBG: Recent Labs  Lab 12/25/23 1146 12/25/23 1735 12/25/23 2101 12/26/23 0731 12/26/23 1148  GLUCAP 171* 134* 122* 80 173*    Discharge time spent: 37 minutes.  Signed: Marcelino Duster, MD [facility] 12/26/2023

## 2023-12-26 NOTE — Plan of Care (Signed)

## 2023-12-26 NOTE — Consult Note (Signed)
[facility] Liaison Note  12/26/2023  Rachael Blankenship 1956-08-25 [id number]  Location: RN Hospital Liaison screened the patient remotely at [facility].  Insurance: Medicare   Rachael Blankenship is a 68 y.o. adult who is a Primary Care Patient of Sallyanne Kuster, NP [facility]).  The patient was screened for  readmission hospitalization with noted medium risk score for unplanned readmission risk with 1 IP in 6 months.  The patient was assessed for potential Care Management service needs for post hospital transition for care coordination. Review of patient's electronic medical record reveals patient. Acute hypoxic on chronic hypercapnic respiratory failure. No anticipated needs presented at this time.   Plan: [facility] Liaison will continue to follow progress and disposition to asess for post hospital community care coordination/management needs.  Referral request for community care coordination: anticipate Transitions of Care Team follow up.   [facility]BCI Care Management/Population Health does not replace or interfere with any arrangements made by the Inpatient Transition of Care team.   For questions contact:   Elliot Cousin, RN, [facility] Liaison [place]   [facility], Population Health Office Hours MTWF  8:00 am-6:00 pm Direct Dial: [phone] mobile [phone] [Office toll free line] Office Hours are M-F 8:30 - 5 pm [email or web address]

## 2023-12-28 ENCOUNTER — Encounter: Payer: Self-pay | Admitting: Nurse Practitioner

## 2023-12-29 ENCOUNTER — Telehealth: Payer: Self-pay | Admitting: Nurse Practitioner

## 2023-12-29 NOTE — Telephone Encounter (Signed)
Attempted to contact patient to schedule HF. No answer, mb full. Sent message-Toni

## 2023-12-30 ENCOUNTER — Telehealth: Payer: Self-pay | Admitting: Nurse Practitioner

## 2023-12-30 NOTE — Telephone Encounter (Signed)
Attempted to contact patient again for hospital follow up. No answer, mb still full-Toni

## 2024-01-07 DIAGNOSIS — J4489 Other specified chronic obstructive pulmonary disease: Secondary | ICD-10-CM | POA: Diagnosis not present

## 2024-01-07 DIAGNOSIS — J9611 Chronic respiratory failure with hypoxia: Secondary | ICD-10-CM | POA: Diagnosis not present

## 2024-01-07 DIAGNOSIS — R053 Chronic cough: Secondary | ICD-10-CM | POA: Diagnosis not present

## 2024-01-07 DIAGNOSIS — R0609 Other forms of dyspnea: Secondary | ICD-10-CM | POA: Diagnosis not present

## 2024-01-07 DIAGNOSIS — I272 Pulmonary hypertension, unspecified: Secondary | ICD-10-CM | POA: Diagnosis not present

## 2024-01-08 DIAGNOSIS — I272 Pulmonary hypertension, unspecified: Secondary | ICD-10-CM | POA: Diagnosis not present

## 2024-01-08 DIAGNOSIS — Z7951 Long term (current) use of inhaled steroids: Secondary | ICD-10-CM | POA: Diagnosis not present

## 2024-01-08 DIAGNOSIS — J9611 Chronic respiratory failure with hypoxia: Secondary | ICD-10-CM | POA: Diagnosis not present

## 2024-01-08 DIAGNOSIS — Z87891 Personal history of nicotine dependence: Secondary | ICD-10-CM | POA: Diagnosis not present

## 2024-01-08 DIAGNOSIS — Z7982 Long term (current) use of aspirin: Secondary | ICD-10-CM | POA: Diagnosis not present

## 2024-01-08 DIAGNOSIS — E785 Hyperlipidemia, unspecified: Secondary | ICD-10-CM | POA: Diagnosis not present

## 2024-01-08 DIAGNOSIS — Z9981 Dependence on supplemental oxygen: Secondary | ICD-10-CM | POA: Diagnosis not present

## 2024-01-08 DIAGNOSIS — J4489 Other specified chronic obstructive pulmonary disease: Secondary | ICD-10-CM | POA: Diagnosis not present

## 2024-01-09 ENCOUNTER — Encounter: Payer: Self-pay | Admitting: Nurse Practitioner

## 2024-01-13 DIAGNOSIS — Z7982 Long term (current) use of aspirin: Secondary | ICD-10-CM | POA: Diagnosis not present

## 2024-01-13 DIAGNOSIS — E785 Hyperlipidemia, unspecified: Secondary | ICD-10-CM | POA: Diagnosis not present

## 2024-01-13 DIAGNOSIS — J4489 Other specified chronic obstructive pulmonary disease: Secondary | ICD-10-CM | POA: Diagnosis not present

## 2024-01-13 DIAGNOSIS — I272 Pulmonary hypertension, unspecified: Secondary | ICD-10-CM | POA: Diagnosis not present

## 2024-01-13 DIAGNOSIS — J9611 Chronic respiratory failure with hypoxia: Secondary | ICD-10-CM | POA: Diagnosis not present

## 2024-01-13 DIAGNOSIS — Z87891 Personal history of nicotine dependence: Secondary | ICD-10-CM | POA: Diagnosis not present

## 2024-01-14 ENCOUNTER — Other Ambulatory Visit: Payer: Self-pay | Admitting: Nurse Practitioner

## 2024-01-14 DIAGNOSIS — E559 Vitamin D deficiency, unspecified: Secondary | ICD-10-CM | POA: Diagnosis not present

## 2024-01-14 DIAGNOSIS — K219 Gastro-esophageal reflux disease without esophagitis: Secondary | ICD-10-CM | POA: Diagnosis not present

## 2024-01-14 DIAGNOSIS — Z131 Encounter for screening for diabetes mellitus: Secondary | ICD-10-CM | POA: Diagnosis not present

## 2024-01-14 DIAGNOSIS — J9611 Chronic respiratory failure with hypoxia: Secondary | ICD-10-CM | POA: Diagnosis not present

## 2024-01-14 DIAGNOSIS — M810 Age-related osteoporosis without current pathological fracture: Secondary | ICD-10-CM | POA: Diagnosis not present

## 2024-01-14 DIAGNOSIS — J449 Chronic obstructive pulmonary disease, unspecified: Secondary | ICD-10-CM | POA: Diagnosis not present

## 2024-01-14 DIAGNOSIS — I272 Pulmonary hypertension, unspecified: Secondary | ICD-10-CM | POA: Diagnosis not present

## 2024-01-14 DIAGNOSIS — I10 Essential (primary) hypertension: Secondary | ICD-10-CM | POA: Diagnosis not present

## 2024-01-14 DIAGNOSIS — M1990 Unspecified osteoarthritis, unspecified site: Secondary | ICD-10-CM | POA: Diagnosis not present

## 2024-01-15 ENCOUNTER — Other Ambulatory Visit: Payer: Self-pay | Admitting: Nurse Practitioner

## 2024-01-15 DIAGNOSIS — E876 Hypokalemia: Secondary | ICD-10-CM

## 2024-01-20 DIAGNOSIS — I272 Pulmonary hypertension, unspecified: Secondary | ICD-10-CM | POA: Diagnosis not present

## 2024-01-20 DIAGNOSIS — J4489 Other specified chronic obstructive pulmonary disease: Secondary | ICD-10-CM | POA: Diagnosis not present

## 2024-01-20 DIAGNOSIS — Z87891 Personal history of nicotine dependence: Secondary | ICD-10-CM | POA: Diagnosis not present

## 2024-01-20 DIAGNOSIS — E785 Hyperlipidemia, unspecified: Secondary | ICD-10-CM | POA: Diagnosis not present

## 2024-01-20 DIAGNOSIS — J9611 Chronic respiratory failure with hypoxia: Secondary | ICD-10-CM | POA: Diagnosis not present

## 2024-01-20 DIAGNOSIS — Z7982 Long term (current) use of aspirin: Secondary | ICD-10-CM | POA: Diagnosis not present

## 2024-01-22 ENCOUNTER — Other Ambulatory Visit: Payer: Self-pay | Admitting: Nurse Practitioner

## 2024-01-22 DIAGNOSIS — E876 Hypokalemia: Secondary | ICD-10-CM

## 2024-01-25 ENCOUNTER — Other Ambulatory Visit: Payer: Self-pay | Admitting: Nurse Practitioner

## 2024-01-25 DIAGNOSIS — E876 Hypokalemia: Secondary | ICD-10-CM

## 2024-01-25 DIAGNOSIS — J4489 Other specified chronic obstructive pulmonary disease: Secondary | ICD-10-CM

## 2024-01-26 ENCOUNTER — Ambulatory Visit: Payer: Medicare Other | Admitting: Physician Assistant

## 2024-01-27 ENCOUNTER — Ambulatory Visit: Payer: Medicare Other | Admitting: Internal Medicine

## 2024-01-30 DIAGNOSIS — J9611 Chronic respiratory failure with hypoxia: Secondary | ICD-10-CM | POA: Diagnosis not present

## 2024-01-30 DIAGNOSIS — E785 Hyperlipidemia, unspecified: Secondary | ICD-10-CM | POA: Diagnosis not present

## 2024-01-30 DIAGNOSIS — Z87891 Personal history of nicotine dependence: Secondary | ICD-10-CM | POA: Diagnosis not present

## 2024-01-30 DIAGNOSIS — Z7982 Long term (current) use of aspirin: Secondary | ICD-10-CM | POA: Diagnosis not present

## 2024-01-30 DIAGNOSIS — J4489 Other specified chronic obstructive pulmonary disease: Secondary | ICD-10-CM | POA: Diagnosis not present

## 2024-01-30 DIAGNOSIS — I272 Pulmonary hypertension, unspecified: Secondary | ICD-10-CM | POA: Diagnosis not present

## 2024-02-03 DIAGNOSIS — E785 Hyperlipidemia, unspecified: Secondary | ICD-10-CM | POA: Diagnosis not present

## 2024-02-03 DIAGNOSIS — J4489 Other specified chronic obstructive pulmonary disease: Secondary | ICD-10-CM | POA: Diagnosis not present

## 2024-02-03 DIAGNOSIS — Z87891 Personal history of nicotine dependence: Secondary | ICD-10-CM | POA: Diagnosis not present

## 2024-02-03 DIAGNOSIS — I272 Pulmonary hypertension, unspecified: Secondary | ICD-10-CM | POA: Diagnosis not present

## 2024-02-03 DIAGNOSIS — Z7982 Long term (current) use of aspirin: Secondary | ICD-10-CM | POA: Diagnosis not present

## 2024-02-03 DIAGNOSIS — J9611 Chronic respiratory failure with hypoxia: Secondary | ICD-10-CM | POA: Diagnosis not present

## 2024-02-07 DIAGNOSIS — Z7951 Long term (current) use of inhaled steroids: Secondary | ICD-10-CM | POA: Diagnosis not present

## 2024-02-07 DIAGNOSIS — Z9981 Dependence on supplemental oxygen: Secondary | ICD-10-CM | POA: Diagnosis not present

## 2024-02-07 DIAGNOSIS — Z7982 Long term (current) use of aspirin: Secondary | ICD-10-CM | POA: Diagnosis not present

## 2024-02-07 DIAGNOSIS — J4489 Other specified chronic obstructive pulmonary disease: Secondary | ICD-10-CM | POA: Diagnosis not present

## 2024-02-07 DIAGNOSIS — J9611 Chronic respiratory failure with hypoxia: Secondary | ICD-10-CM | POA: Diagnosis not present

## 2024-02-07 DIAGNOSIS — I272 Pulmonary hypertension, unspecified: Secondary | ICD-10-CM | POA: Diagnosis not present

## 2024-02-07 DIAGNOSIS — Z87891 Personal history of nicotine dependence: Secondary | ICD-10-CM | POA: Diagnosis not present

## 2024-02-07 DIAGNOSIS — E785 Hyperlipidemia, unspecified: Secondary | ICD-10-CM | POA: Diagnosis not present

## 2024-02-17 DIAGNOSIS — I272 Pulmonary hypertension, unspecified: Secondary | ICD-10-CM | POA: Diagnosis not present

## 2024-02-17 DIAGNOSIS — Z87891 Personal history of nicotine dependence: Secondary | ICD-10-CM | POA: Diagnosis not present

## 2024-02-17 DIAGNOSIS — J4489 Other specified chronic obstructive pulmonary disease: Secondary | ICD-10-CM | POA: Diagnosis not present

## 2024-02-17 DIAGNOSIS — Z7982 Long term (current) use of aspirin: Secondary | ICD-10-CM | POA: Diagnosis not present

## 2024-02-17 DIAGNOSIS — J9611 Chronic respiratory failure with hypoxia: Secondary | ICD-10-CM | POA: Diagnosis not present

## 2024-02-17 DIAGNOSIS — E785 Hyperlipidemia, unspecified: Secondary | ICD-10-CM | POA: Diagnosis not present

## 2024-02-19 DIAGNOSIS — J301 Allergic rhinitis due to pollen: Secondary | ICD-10-CM | POA: Diagnosis not present

## 2024-02-19 DIAGNOSIS — J449 Chronic obstructive pulmonary disease, unspecified: Secondary | ICD-10-CM | POA: Diagnosis not present

## 2024-02-19 DIAGNOSIS — I272 Pulmonary hypertension, unspecified: Secondary | ICD-10-CM | POA: Diagnosis not present

## 2024-02-19 DIAGNOSIS — R053 Chronic cough: Secondary | ICD-10-CM | POA: Diagnosis not present

## 2024-02-19 DIAGNOSIS — J9611 Chronic respiratory failure with hypoxia: Secondary | ICD-10-CM | POA: Diagnosis not present

## 2024-02-29 ENCOUNTER — Other Ambulatory Visit: Payer: Self-pay | Admitting: Nurse Practitioner

## 2024-02-29 DIAGNOSIS — K219 Gastro-esophageal reflux disease without esophagitis: Secondary | ICD-10-CM

## 2024-03-02 ENCOUNTER — Ambulatory Visit: Payer: Medicare Other | Admitting: Nurse Practitioner

## 2024-03-02 DIAGNOSIS — I272 Pulmonary hypertension, unspecified: Secondary | ICD-10-CM | POA: Diagnosis not present

## 2024-03-02 DIAGNOSIS — J4489 Other specified chronic obstructive pulmonary disease: Secondary | ICD-10-CM | POA: Diagnosis not present

## 2024-03-02 DIAGNOSIS — E785 Hyperlipidemia, unspecified: Secondary | ICD-10-CM | POA: Diagnosis not present

## 2024-03-02 DIAGNOSIS — Z87891 Personal history of nicotine dependence: Secondary | ICD-10-CM | POA: Diagnosis not present

## 2024-03-02 DIAGNOSIS — Z7982 Long term (current) use of aspirin: Secondary | ICD-10-CM | POA: Diagnosis not present

## 2024-03-02 DIAGNOSIS — J9611 Chronic respiratory failure with hypoxia: Secondary | ICD-10-CM | POA: Diagnosis not present

## 2024-03-28 ENCOUNTER — Other Ambulatory Visit: Payer: Self-pay | Admitting: Nurse Practitioner

## 2024-03-28 DIAGNOSIS — E782 Mixed hyperlipidemia: Secondary | ICD-10-CM

## 2024-03-30 DIAGNOSIS — J449 Chronic obstructive pulmonary disease, unspecified: Secondary | ICD-10-CM | POA: Diagnosis not present

## 2024-03-31 DIAGNOSIS — I272 Pulmonary hypertension, unspecified: Secondary | ICD-10-CM | POA: Diagnosis not present

## 2024-03-31 DIAGNOSIS — E782 Mixed hyperlipidemia: Secondary | ICD-10-CM | POA: Diagnosis not present

## 2024-03-31 DIAGNOSIS — J449 Chronic obstructive pulmonary disease, unspecified: Secondary | ICD-10-CM | POA: Diagnosis not present

## 2024-03-31 DIAGNOSIS — I10 Essential (primary) hypertension: Secondary | ICD-10-CM | POA: Diagnosis not present

## 2024-03-31 DIAGNOSIS — Z79899 Other long term (current) drug therapy: Secondary | ICD-10-CM | POA: Diagnosis not present

## 2024-04-05 ENCOUNTER — Other Ambulatory Visit: Payer: Self-pay | Admitting: Nurse Practitioner

## 2024-04-05 DIAGNOSIS — K2930 Chronic superficial gastritis without bleeding: Secondary | ICD-10-CM

## 2024-04-05 DIAGNOSIS — J9611 Chronic respiratory failure with hypoxia: Secondary | ICD-10-CM | POA: Diagnosis not present

## 2024-04-05 DIAGNOSIS — J449 Chronic obstructive pulmonary disease, unspecified: Secondary | ICD-10-CM | POA: Diagnosis not present

## 2024-04-05 DIAGNOSIS — R7303 Prediabetes: Secondary | ICD-10-CM | POA: Diagnosis not present

## 2024-04-05 DIAGNOSIS — K219 Gastro-esophageal reflux disease without esophagitis: Secondary | ICD-10-CM | POA: Diagnosis not present

## 2024-04-05 DIAGNOSIS — I10 Essential (primary) hypertension: Secondary | ICD-10-CM | POA: Diagnosis not present

## 2024-04-11 ENCOUNTER — Other Ambulatory Visit: Payer: Self-pay | Admitting: Nurse Practitioner

## 2024-04-11 DIAGNOSIS — E876 Hypokalemia: Secondary | ICD-10-CM

## 2024-04-11 DIAGNOSIS — T502X5A Adverse effect of carbonic-anhydrase inhibitors, benzothiadiazides and other diuretics, initial encounter: Secondary | ICD-10-CM

## 2024-04-11 DIAGNOSIS — J449 Chronic obstructive pulmonary disease, unspecified: Secondary | ICD-10-CM

## 2024-04-12 DIAGNOSIS — M1990 Unspecified osteoarthritis, unspecified site: Secondary | ICD-10-CM | POA: Diagnosis not present

## 2024-04-12 DIAGNOSIS — M810 Age-related osteoporosis without current pathological fracture: Secondary | ICD-10-CM | POA: Diagnosis not present

## 2024-04-12 DIAGNOSIS — I272 Pulmonary hypertension, unspecified: Secondary | ICD-10-CM | POA: Diagnosis not present

## 2024-04-12 DIAGNOSIS — E559 Vitamin D deficiency, unspecified: Secondary | ICD-10-CM | POA: Diagnosis not present

## 2024-04-12 DIAGNOSIS — J449 Chronic obstructive pulmonary disease, unspecified: Secondary | ICD-10-CM | POA: Diagnosis not present

## 2024-04-12 DIAGNOSIS — J9611 Chronic respiratory failure with hypoxia: Secondary | ICD-10-CM | POA: Diagnosis not present

## 2024-04-12 DIAGNOSIS — I10 Essential (primary) hypertension: Secondary | ICD-10-CM | POA: Diagnosis not present

## 2024-04-12 DIAGNOSIS — R7303 Prediabetes: Secondary | ICD-10-CM | POA: Diagnosis not present

## 2024-04-21 ENCOUNTER — Encounter
Admission: RE | Disposition: A | Discharge status: Home / Self Care | Payer: Self-pay | Source: Home / Self Care | Attending: Cardiology

## 2024-04-21 ENCOUNTER — Ambulatory Visit
Admission: RE | Admit: 2024-04-21 | Discharge: 2024-04-21 | Disposition: A | Discharge status: Home / Self Care | Attending: Cardiology | Admitting: Cardiology

## 2024-04-21 ENCOUNTER — Other Ambulatory Visit: Payer: Self-pay

## 2024-04-21 ENCOUNTER — Encounter: Payer: Self-pay | Admitting: Cardiology

## 2024-04-21 DIAGNOSIS — I272 Pulmonary hypertension, unspecified: Secondary | ICD-10-CM | POA: Diagnosis not present

## 2024-04-21 HISTORY — PX: RIGHT HEART CATH: CATH118263

## 2024-04-21 MED ORDER — HEPARIN (PORCINE) IN NACL 1000-0.9 UT/500ML-% IV SOLN
INTRAVENOUS | Status: DC | PRN
  Administered 2024-04-21: 1000 mL

## 2024-04-21 MED ORDER — SODIUM CHLORIDE 0.9% FLUSH: 3.0000 mL | INTRAVENOUS | Status: DC | PRN

## 2024-04-21 MED ORDER — SODIUM CHLORIDE 0.9% FLUSH: 3.0000 mL | Freq: Two times a day (BID) | INTRAVENOUS | Status: DC

## 2024-04-21 MED ORDER — HYDRALAZINE HCL 20 MG/ML IJ SOLN: 10.0000 mg | INTRAMUSCULAR | Status: DC | PRN

## 2024-04-21 MED ORDER — SODIUM CHLORIDE 0.9 % WEIGHT BASED INFUSION
3.0000 mL/kg/h | INTRAVENOUS | Status: AC
  Administered 2024-04-21: 3 mL/kg/h via INTRAVENOUS

## 2024-04-21 MED ORDER — LIDOCAINE HCL (PF) 1 % IJ SOLN
INTRAMUSCULAR | Status: DC | PRN
  Administered 2024-04-21: 2 mL

## 2024-04-21 MED ORDER — SODIUM CHLORIDE 0.9% FLUSH
3.0000 mL | INTRAVENOUS | Status: DC | PRN
Start: 2024-04-21 — End: 2024-04-21

## 2024-04-21 MED ORDER — LIDOCAINE HCL 1 % IJ SOLN
INTRAMUSCULAR | Status: AC
  Filled 2024-04-21: qty 20

## 2024-04-21 MED ORDER — ONDANSETRON HCL 4 MG/2ML IJ SOLN: 4.0000 mg | Freq: Four times a day (QID) | INTRAMUSCULAR | Status: DC | PRN

## 2024-04-21 MED ORDER — SODIUM CHLORIDE 0.9 % WEIGHT BASED INFUSION: 1.0000 mL/kg/h | INTRAVENOUS | Status: DC

## 2024-04-21 MED ORDER — SODIUM CHLORIDE 0.9 % IV SOLN: 250.0000 mL | INTRAVENOUS | Status: DC | PRN

## 2024-04-21 MED ORDER — LABETALOL HCL 5 MG/ML IV SOLN: 10.0000 mg | INTRAVENOUS | Status: DC | PRN

## 2024-04-21 MED ORDER — SODIUM CHLORIDE 0.9 % IV SOLN
250.0000 mL | INTRAVENOUS | Status: DC | PRN
Start: 2024-04-21 — End: 2024-04-21

## 2024-04-21 MED ORDER — ACETAMINOPHEN 325 MG PO TABS: 650.0000 mg | ORAL_TABLET | ORAL | Status: DC | PRN

## 2024-04-21 NOTE — Discharge Instructions (Signed)
Right Heart Cath, Care After This sheet gives you information about how to care for yourself after your procedure. Your health care provider may also give you more specific instructions. If you have problems or questions, contact your health care provider. What can I expect after the procedure? After the procedure, it is common to have: Bruising or mild discomfort in the area where the IV was inserted (insertion site). Follow these instructions at home: Eating and drinking  You may eat and drink after your procedure.  Drink a lot of fluids for the first several days after the procedure, as directed by your health care provider. This helps to wash (flush) the contrast out of your body. Examples of healthy fluids include water or low-calorie drinks. General instructions Check your IV insertion area and also your venous access site every day for signs of infection. Check for: Redness, swelling, or pain. Fluid or blood. Warmth. Pus or a bad smell. Take over-the-counter and prescription medicines only as told by your health care provider. Rest and return to your normal activities as told by your health care provider. Ask your health care provider what activities are safe for you. Do not drive for 24 hours if you were given a medicine to help you relax (sedative), or until your health care provider approves. Keep all follow-up visits as told by your health care provider. This is important. Contact a health care provider if: Your skin becomes itchy or you develop a rash or hives. You have a fever that does not get better with medicine. You feel nauseous. You vomit. You have redness, swelling, or pain around the insertion site. You have fluid or blood coming from the insertion site. Your insertion area feels warm to the touch. You have pus or a bad smell coming from the insertion site. Get help right away if: You have difficulty breathing or shortness of breath. You develop chest pain. You  faint. You feel very dizzy. These symptoms may represent a serious problem that is an emergency. Do not wait to see if the symptoms will go away. Get medical help right away. Call your local emergency services (911 in the [place] Do not drive yourself to the hospital. Summary After your procedure, it is common to have bruising or mild discomfort in the area where the IV was inserted. You should check your IV insertion area every day for signs of infection. Take over-the-counter and prescription medicines only as told by your health care provider. You should drink a lot of fluids for the first several days after the procedure to help flush the contrast from your body. This information is not intended to replace advice given to you by your health care provider. Make sure you discuss any questions you have with your health care provider. Document Released: 09/01/2013 Document Revised: 10/24/2017 Document Reviewed: 10/05/2016 Elsevier Patient Education  2020 [employer] 

## 2024-04-25 ENCOUNTER — Other Ambulatory Visit: Payer: Self-pay | Admitting: Nurse Practitioner

## 2024-04-25 DIAGNOSIS — F418 Other specified anxiety disorders: Secondary | ICD-10-CM

## 2024-04-26 NOTE — Telephone Encounter (Signed)
 Please review

## 2024-04-28 DIAGNOSIS — E782 Mixed hyperlipidemia: Secondary | ICD-10-CM | POA: Diagnosis not present

## 2024-04-28 DIAGNOSIS — I272 Pulmonary hypertension, unspecified: Secondary | ICD-10-CM | POA: Diagnosis not present

## 2024-04-28 DIAGNOSIS — I10 Essential (primary) hypertension: Secondary | ICD-10-CM | POA: Diagnosis not present

## 2024-04-28 DIAGNOSIS — J449 Chronic obstructive pulmonary disease, unspecified: Secondary | ICD-10-CM | POA: Diagnosis not present

## 2024-05-16 ENCOUNTER — Other Ambulatory Visit: Payer: Self-pay | Admitting: Internal Medicine

## 2024-05-16 DIAGNOSIS — F418 Other specified anxiety disorders: Secondary | ICD-10-CM

## 2024-05-17 ENCOUNTER — Ambulatory Visit: Payer: Medicare Other | Admitting: Family Medicine

## 2024-05-19 ENCOUNTER — Telehealth: Payer: Self-pay | Admitting: Nurse Practitioner

## 2024-05-19 NOTE — Telephone Encounter (Signed)
 Attempted to contact pt to schedule follow up appointment for medication refills. No answer, MB full, sent message-Toni

## 2024-05-20 DIAGNOSIS — J449 Chronic obstructive pulmonary disease, unspecified: Secondary | ICD-10-CM | POA: Diagnosis not present

## 2024-05-20 DIAGNOSIS — R5381 Other malaise: Secondary | ICD-10-CM | POA: Diagnosis not present

## 2024-05-20 DIAGNOSIS — Z79899 Other long term (current) drug therapy: Secondary | ICD-10-CM | POA: Diagnosis not present

## 2024-05-20 DIAGNOSIS — I272 Pulmonary hypertension, unspecified: Secondary | ICD-10-CM | POA: Diagnosis not present

## 2024-05-20 DIAGNOSIS — J9611 Chronic respiratory failure with hypoxia: Secondary | ICD-10-CM | POA: Diagnosis not present

## 2024-05-20 DIAGNOSIS — J4540 Moderate persistent asthma, uncomplicated: Secondary | ICD-10-CM | POA: Diagnosis not present

## 2024-05-25 ENCOUNTER — Telehealth: Payer: Self-pay | Admitting: Nurse Practitioner

## 2024-05-25 NOTE — Telephone Encounter (Signed)
 Called patient again to schedule appointment in order to fill medications. She stated she has switched pcp-Toni

## 2024-06-08 ENCOUNTER — Encounter: Attending: Pulmonary Disease | Admitting: *Deleted

## 2024-06-08 DIAGNOSIS — J449 Chronic obstructive pulmonary disease, unspecified: Secondary | ICD-10-CM

## 2024-06-08 NOTE — Progress Notes (Signed)
 Initial phone call completed. Diagnosis can be found in [facility] 05/20/24 . EP Orientation scheduled for Wednesday 7/16 at 1:30.

## 2024-06-09 ENCOUNTER — Ambulatory Visit

## 2024-06-10 ENCOUNTER — Other Ambulatory Visit: Payer: Self-pay | Admitting: Nurse Practitioner

## 2024-06-30 ENCOUNTER — Other Ambulatory Visit: Payer: Self-pay | Admitting: Nurse Practitioner

## 2024-06-30 DIAGNOSIS — F418 Other specified anxiety disorders: Secondary | ICD-10-CM

## 2024-07-08 ENCOUNTER — Other Ambulatory Visit: Payer: Self-pay | Admitting: Nurse Practitioner

## 2024-07-08 DIAGNOSIS — J449 Chronic obstructive pulmonary disease, unspecified: Secondary | ICD-10-CM

## 2024-07-08 DIAGNOSIS — E876 Hypokalemia: Secondary | ICD-10-CM

## 2024-07-13 DIAGNOSIS — R0602 Shortness of breath: Secondary | ICD-10-CM | POA: Diagnosis not present

## 2024-07-13 DIAGNOSIS — J441 Chronic obstructive pulmonary disease with (acute) exacerbation: Secondary | ICD-10-CM | POA: Diagnosis not present

## 2024-07-26 DIAGNOSIS — U071 COVID-19: Secondary | ICD-10-CM

## 2024-07-26 HISTORY — DX: COVID-19: U07.1

## 2024-07-27 DIAGNOSIS — F419 Anxiety disorder, unspecified: Secondary | ICD-10-CM | POA: Diagnosis not present

## 2024-07-27 DIAGNOSIS — F32A Depression, unspecified: Secondary | ICD-10-CM | POA: Diagnosis not present

## 2024-08-03 DIAGNOSIS — R509 Fever, unspecified: Secondary | ICD-10-CM | POA: Diagnosis not present

## 2024-08-03 DIAGNOSIS — R0989 Other specified symptoms and signs involving the circulatory and respiratory systems: Secondary | ICD-10-CM | POA: Diagnosis not present

## 2024-08-03 DIAGNOSIS — Z03818 Encounter for observation for suspected exposure to other biological agents ruled out: Secondary | ICD-10-CM | POA: Diagnosis not present

## 2024-08-03 DIAGNOSIS — R519 Headache, unspecified: Secondary | ICD-10-CM | POA: Diagnosis not present

## 2024-08-18 DIAGNOSIS — J9611 Chronic respiratory failure with hypoxia: Secondary | ICD-10-CM | POA: Diagnosis not present

## 2024-08-18 DIAGNOSIS — R0602 Shortness of breath: Secondary | ICD-10-CM | POA: Diagnosis not present

## 2024-08-18 DIAGNOSIS — R0781 Pleurodynia: Secondary | ICD-10-CM | POA: Diagnosis not present

## 2024-08-18 DIAGNOSIS — U071 COVID-19: Secondary | ICD-10-CM | POA: Diagnosis not present

## 2024-08-18 DIAGNOSIS — J441 Chronic obstructive pulmonary disease with (acute) exacerbation: Secondary | ICD-10-CM | POA: Diagnosis not present

## 2024-09-06 ENCOUNTER — Other Ambulatory Visit: Payer: Self-pay | Admitting: Specialist

## 2024-09-06 ENCOUNTER — Other Ambulatory Visit
Admission: RE | Admit: 2024-09-06 | Discharge: 2024-09-06 | Disposition: A | Discharge status: Home / Self Care | Source: Ambulatory Visit | Attending: Specialist | Admitting: Specialist

## 2024-09-06 ENCOUNTER — Ambulatory Visit
Admission: RE | Admit: 2024-09-06 | Discharge: 2024-09-06 | Disposition: A | Discharge status: Home / Self Care | Source: Ambulatory Visit | Attending: Specialist | Admitting: Specialist

## 2024-09-06 DIAGNOSIS — R7989 Other specified abnormal findings of blood chemistry: Secondary | ICD-10-CM

## 2024-09-06 DIAGNOSIS — R079 Chest pain, unspecified: Secondary | ICD-10-CM | POA: Diagnosis present

## 2024-09-06 DIAGNOSIS — K219 Gastro-esophageal reflux disease without esophagitis: Secondary | ICD-10-CM | POA: Diagnosis not present

## 2024-09-06 DIAGNOSIS — R0789 Other chest pain: Secondary | ICD-10-CM | POA: Diagnosis not present

## 2024-09-06 DIAGNOSIS — Z9981 Dependence on supplemental oxygen: Secondary | ICD-10-CM | POA: Diagnosis not present

## 2024-09-06 LAB — D-DIMER, QUANTITATIVE: D-Dimer, Quant: 0.89 ug{FEU}/mL — ABNORMAL HIGH (ref 0.00–0.50)

## 2024-09-06 MED ORDER — IOHEXOL 350 MG/ML SOLN
75.0000 mL | Freq: Once | INTRAVENOUS | Status: AC | PRN
  Administered 2024-09-06: 75 mL via INTRAVENOUS

## 2024-09-13 ENCOUNTER — Other Ambulatory Visit: Payer: Self-pay | Admitting: Pulmonary Disease

## 2024-09-16 ENCOUNTER — Encounter
Admission: RE | Admit: 2024-09-16 | Discharge: 2024-09-16 | Disposition: A | Discharge status: Home / Self Care | Source: Ambulatory Visit | Attending: Pulmonary Disease | Admitting: Pulmonary Disease

## 2024-09-16 ENCOUNTER — Encounter: Payer: Self-pay | Admitting: Pulmonary Disease

## 2024-09-16 ENCOUNTER — Other Ambulatory Visit: Payer: Self-pay

## 2024-09-16 VITALS — Wt 147.0 lb

## 2024-09-16 DIAGNOSIS — J9811 Atelectasis: Secondary | ICD-10-CM

## 2024-09-16 DIAGNOSIS — I10 Essential (primary) hypertension: Secondary | ICD-10-CM

## 2024-09-16 DIAGNOSIS — Z01812 Encounter for preprocedural laboratory examination: Secondary | ICD-10-CM

## 2024-09-16 DIAGNOSIS — J449 Chronic obstructive pulmonary disease, unspecified: Secondary | ICD-10-CM

## 2024-09-16 HISTORY — DX: Vitamin D deficiency, unspecified: E55.9

## 2024-09-16 HISTORY — DX: Spontaneous ecchymoses: R23.3

## 2024-09-16 HISTORY — DX: Atelectasis: J98.11

## 2024-09-16 HISTORY — DX: Pulmonary hypertension, unspecified: I27.20

## 2024-09-16 HISTORY — DX: Personal history of other infectious and parasitic diseases: Z86.19

## 2024-09-16 MED ORDER — LACTATED RINGERS IV SOLN: INTRAVENOUS | Status: DC

## 2024-09-16 MED ORDER — CHLORHEXIDINE GLUCONATE 0.12 % MT SOLN: 15.0000 mL | Freq: Once | OROMUCOSAL | Status: DC

## 2024-09-16 MED ORDER — ORAL CARE MOUTH RINSE: 15.0000 mL | Freq: Once | OROMUCOSAL | Status: DC

## 2024-09-16 NOTE — Patient Instructions (Addendum)
 Your procedure is scheduled on:09-17-24 Friday Report to the Registration Desk on the 1st floor of the [facility].Then proceed to the 2nd floor Surgery Desk To find out your arrival time, please call [phone] between 1PM - 3PM on:09-16-24 Thursday If your arrival time is 6:00 am, do not arrive before that time as the [facility] entrance doors do not open until 6:00 am.  REMEMBER: Instructions that are not followed completely may result in serious medical risk, up to and including death; or upon the discretion of your surgeon and anesthesiologist your surgery may need to be rescheduled.  Do not eat food OR drink liquids after midnight the night before surgery.  No gum chewing or hard candies.  One week prior to surgery:Stop NOW (09-16-24) Stop Anti-inflammatories (NSAIDS) such as Advil, Aleve, Ibuprofen, Motrin, Naproxen, Naprosyn and Aspirin  based products such as Excedrin, Goody's Powder, BC Powder. Stop ANY OVER THE COUNTER supplements until after surgery (Vitamin C , Vitamin D , CO Q-10, Magnesium , Multivitamin, Vitamin E )  You may however, continue to take Tylenol /Tramadol/Hydrocodone if needed for pain up until the day of surgery.  Continue taking all of your other prescription medications up until the day of surgery.  ON THE DAY OF SURGERY ONLY TAKE THESE MEDICATIONS WITH SIPS OF WATER: -buPROPion  (WELLBUTRIN )  -DULoxetine  (CYMBALTA )  -famotidine  (PEPCID )  -metoprolol  tartrate (LOPRESSOR )  -Potassium Chloride  CR  -You may take ALPRAZolam  (XANAX ) if needed for anxiety -You may take HYDROcodone-acetaminophen  (NORCO/VICODIN) OR Tramadol if needed for pain  Use your Breztri Inhaler and your Combivent  Nebulizer the day of surgery and bring your Combivent  Inhaler to the hospital   Last dose of 81 mg Aspirin  was on 09-13-24 Monday  No Alcohol for 24 hours before or after surgery.  No Smoking including e-cigarettes for 24 hours before surgery.  No chewable tobacco products  for at least 6 hours before surgery.  No nicotine  patches on the day of surgery.  Do not use any recreational drugs for at least a week (preferably 2 weeks) before your surgery.  Please be advised that the combination of cocaine and anesthesia may have negative outcomes, up to and including death. If you test positive for cocaine, your surgery will be cancelled.  On the morning of surgery brush your teeth with toothpaste and water, you may rinse your mouth with mouthwash if you wish. Do not swallow any toothpaste or mouthwash.  Do not wear jewelry, make-up, hairpins, clips or nail polish.  For welded (permanent) jewelry: bracelets, anklets, waist bands, etc.  Please have this removed prior to surgery.  If it is not removed, there is a chance that hospital personnel will need to cut it off on the day of surgery.  Do not wear lotions, powders, or perfumes.   Do not shave body hair from the neck down 48 hours before surgery.  Contact lenses, hearing aids and dentures may not be worn into surgery.  Do not bring valuables to the hospital. [facility] is not responsible for any missing/lost belongings or valuables.   Notify your doctor if there is any change in your medical condition (cold, fever, infection).  Wear comfortable clothing (specific to your surgery type) to the hospital.  After surgery, you can help prevent lung complications by doing breathing exercises.  Take deep breaths and cough every 1-2 hours. Your doctor may order a device called an Incentive Spirometer to help you take deep breaths. When coughing or sneezing, hold a pillow firmly against your incision with both hands. This  is called "splinting." Doing this helps protect your incision. It also decreases belly discomfort.  If you are being admitted to the hospital overnight, leave your suitcase in the car. After surgery it may be brought to your room.  In case of increased patient census, it may be necessary for you,  the patient, to continue your postoperative care in the Same Day Surgery department.  If you are being discharged the day of surgery, you will not be allowed to drive home. You will need a responsible individual to drive you home and stay with you for 24 hours after surgery.   If you are taking public transportation, you will need to have a responsible individual with you.  Please call the Pre-admissions Testing Dept. at [phone] if you have any questions about these instructions.  Surgery Visitation Policy:  Patients having surgery or a procedure may have two visitors.  Children under the age of 3 must have an adult with them who is not the patient.   Merchandiser, retail to address health-related social needs:  [email or web address]

## 2024-09-17 ENCOUNTER — Ambulatory Visit: Admitting: Anesthesiology

## 2024-09-17 ENCOUNTER — Other Ambulatory Visit: Payer: Self-pay

## 2024-09-17 ENCOUNTER — Encounter: Admission: RE | Disposition: A | Payer: Self-pay | Attending: Pulmonary Disease

## 2024-09-17 ENCOUNTER — Encounter: Payer: Self-pay | Admitting: Urgent Care

## 2024-09-17 ENCOUNTER — Ambulatory Visit
Admission: RE | Admit: 2024-09-17 | Discharge: 2024-09-17 | Disposition: A | Discharge status: Home / Self Care | Attending: Pulmonary Disease | Admitting: Pulmonary Disease

## 2024-09-17 ENCOUNTER — Encounter: Payer: Self-pay | Admitting: Pulmonary Disease

## 2024-09-17 ENCOUNTER — Ambulatory Visit

## 2024-09-17 DIAGNOSIS — J439 Emphysema, unspecified: Secondary | ICD-10-CM | POA: Insufficient documentation

## 2024-09-17 DIAGNOSIS — X58XXXA Exposure to other specified factors, initial encounter: Secondary | ICD-10-CM | POA: Diagnosis not present

## 2024-09-17 DIAGNOSIS — Z9981 Dependence on supplemental oxygen: Secondary | ICD-10-CM | POA: Insufficient documentation

## 2024-09-17 DIAGNOSIS — M1990 Unspecified osteoarthritis, unspecified site: Secondary | ICD-10-CM | POA: Diagnosis not present

## 2024-09-17 DIAGNOSIS — K219 Gastro-esophageal reflux disease without esophagitis: Secondary | ICD-10-CM | POA: Diagnosis not present

## 2024-09-17 DIAGNOSIS — I700 Atherosclerosis of aorta: Secondary | ICD-10-CM | POA: Diagnosis not present

## 2024-09-17 DIAGNOSIS — J9819 Other pulmonary collapse: Secondary | ICD-10-CM | POA: Insufficient documentation

## 2024-09-17 DIAGNOSIS — Z87891 Personal history of nicotine dependence: Secondary | ICD-10-CM | POA: Diagnosis not present

## 2024-09-17 DIAGNOSIS — J9811 Atelectasis: Secondary | ICD-10-CM | POA: Insufficient documentation

## 2024-09-17 DIAGNOSIS — T17500A Unspecified foreign body in bronchus causing asphyxiation, initial encounter: Secondary | ICD-10-CM | POA: Diagnosis not present

## 2024-09-17 DIAGNOSIS — Z48813 Encounter for surgical aftercare following surgery on the respiratory system: Secondary | ICD-10-CM | POA: Diagnosis not present

## 2024-09-17 DIAGNOSIS — Z01812 Encounter for preprocedural laboratory examination: Secondary | ICD-10-CM

## 2024-09-17 DIAGNOSIS — R918 Other nonspecific abnormal finding of lung field: Secondary | ICD-10-CM | POA: Diagnosis not present

## 2024-09-17 DIAGNOSIS — I10 Essential (primary) hypertension: Secondary | ICD-10-CM | POA: Insufficient documentation

## 2024-09-17 DIAGNOSIS — T17590A Other foreign object in bronchus causing asphyxiation, initial encounter: Secondary | ICD-10-CM | POA: Diagnosis not present

## 2024-09-17 DIAGNOSIS — J449 Chronic obstructive pulmonary disease, unspecified: Secondary | ICD-10-CM

## 2024-09-17 HISTORY — PX: FLEXIBLE BRONCHOSCOPY: SHX5094

## 2024-09-17 HISTORY — PX: BRONCHIAL WASHINGS: SHX5105

## 2024-09-17 LAB — CBC
HCT: 36.5 % (ref 36.0–46.0)
Hemoglobin: 11.7 g/dL — ABNORMAL LOW (ref 12.0–15.0)
MCH: 28.3 pg (ref 26.0–34.0)
MCHC: 32.1 g/dL (ref 30.0–36.0)
MCV: 88.2 fL (ref 80.0–100.0)
Platelets: 459 K/uL — ABNORMAL HIGH (ref 150–400)
RBC: 4.14 MIL/uL (ref 3.87–5.11)
RDW: 14.3 % (ref 11.5–15.5)
WBC: 7.9 K/uL (ref 4.0–10.5)
nRBC: 0 % (ref 0.0–0.2)

## 2024-09-17 LAB — BASIC METABOLIC PANEL WITH GFR
Anion gap: 12 (ref 5–15)
BUN: 18 mg/dL (ref 8–23)
CO2: 30 mmol/L (ref 22–32)
Calcium: 8.9 mg/dL (ref 8.9–10.3)
Chloride: 100 mmol/L (ref 98–111)
Creatinine, Ser: 0.43 mg/dL — ABNORMAL LOW (ref 0.44–1.00)
GFR, Estimated: >60 mL/min (ref >60)
Glucose, Bld: 120 mg/dL — ABNORMAL HIGH (ref 70–99)
Potassium: 3.4 mmol/L — ABNORMAL LOW (ref 3.5–5.1)
Sodium: 142 mmol/L (ref 135–145)

## 2024-09-17 MED ORDER — IPRATROPIUM-ALBUTEROL 0.5-2.5 (3) MG/3ML IN SOLN
RESPIRATORY_TRACT | Status: AC
Start: 2024-09-17 — End: 2024-09-17
  Filled 2024-09-17: qty 3

## 2024-09-17 MED ORDER — ROCURONIUM BROMIDE 10 MG/ML (PF) SYRINGE
PREFILLED_SYRINGE | INTRAVENOUS | Status: AC
  Filled 2024-09-17: qty 20

## 2024-09-17 MED ORDER — DEXAMETHASONE SOD PHOSPHATE PF 10 MG/ML IJ SOLN
INTRAMUSCULAR | Status: DC | PRN
  Administered 2024-09-17: 10 mg via INTRAVENOUS

## 2024-09-17 MED ORDER — ONDANSETRON HCL 4 MG/2ML IJ SOLN
INTRAMUSCULAR | Status: DC | PRN
  Administered 2024-09-17: 4 mg via INTRAVENOUS

## 2024-09-17 MED ORDER — PROPOFOL 500 MG/50ML IV EMUL
INTRAVENOUS | Status: DC | PRN
  Administered 2024-09-17: 125 ug/kg/min via INTRAVENOUS

## 2024-09-17 MED ORDER — EPHEDRINE 5 MG/ML INJ
INTRAVENOUS | Status: AC
  Filled 2024-09-17: qty 5

## 2024-09-17 MED ORDER — MIDAZOLAM HCL 2 MG/2ML IJ SOLN
INTRAMUSCULAR | Status: AC
  Filled 2024-09-17: qty 2

## 2024-09-17 MED ORDER — MIDAZOLAM HCL (PF) 2 MG/2ML IJ SOLN
INTRAMUSCULAR | Status: DC | PRN
  Administered 2024-09-17: 2 mg via INTRAVENOUS

## 2024-09-17 MED ORDER — PHENYLEPHRINE 80 MCG/ML (10ML) SYRINGE FOR IV PUSH (FOR BLOOD PRESSURE SUPPORT)
PREFILLED_SYRINGE | INTRAVENOUS | Status: AC
Start: 2024-09-17 — End: 2024-09-17
  Filled 2024-09-17: qty 10

## 2024-09-17 MED ORDER — IPRATROPIUM-ALBUTEROL 0.5-2.5 (3) MG/3ML IN SOLN
3.0000 mL | Freq: Once | RESPIRATORY_TRACT | Status: AC
  Administered 2024-09-17: 3 mL via RESPIRATORY_TRACT

## 2024-09-17 MED ORDER — FENTANYL CITRATE (PF) 100 MCG/2ML IJ SOLN
INTRAMUSCULAR | Status: DC | PRN
  Administered 2024-09-17: 50 ug via INTRAVENOUS

## 2024-09-17 MED ORDER — ONDANSETRON HCL 4 MG/2ML IJ SOLN
INTRAMUSCULAR | Status: AC
  Filled 2024-09-17: qty 2

## 2024-09-17 MED ORDER — LIDOCAINE HCL (PF) 2 % IJ SOLN
INTRAMUSCULAR | Status: AC
  Filled 2024-09-17: qty 10

## 2024-09-17 MED ORDER — LIDOCAINE HCL (CARDIAC) PF 100 MG/5ML IV SOSY
PREFILLED_SYRINGE | INTRAVENOUS | Status: DC | PRN
  Administered 2024-09-17: 100 mg via INTRAVENOUS

## 2024-09-17 MED ORDER — PROPOFOL 1000 MG/100ML IV EMUL
INTRAVENOUS | Status: AC
  Filled 2024-09-17: qty 100

## 2024-09-17 MED ORDER — PROPOFOL 10 MG/ML IV BOLUS
INTRAVENOUS | Status: DC | PRN
  Administered 2024-09-17: 80 mg via INTRAVENOUS

## 2024-09-17 MED ORDER — ROCURONIUM BROMIDE 100 MG/10ML IV SOLN
INTRAVENOUS | Status: DC | PRN
  Administered 2024-09-17: 40 mg via INTRAVENOUS

## 2024-09-17 MED ORDER — PHENYLEPHRINE 80 MCG/ML (10ML) SYRINGE FOR IV PUSH (FOR BLOOD PRESSURE SUPPORT)
PREFILLED_SYRINGE | INTRAVENOUS | Status: AC
  Filled 2024-09-17: qty 10

## 2024-09-17 MED ORDER — SUGAMMADEX SODIUM 200 MG/2ML IV SOLN
INTRAVENOUS | Status: DC | PRN
  Administered 2024-09-17: 200 mg via INTRAVENOUS

## 2024-09-17 MED ORDER — FENTANYL CITRATE (PF) 100 MCG/2ML IJ SOLN
INTRAMUSCULAR | Status: AC
  Filled 2024-09-17: qty 2

## 2024-09-17 MED ORDER — PHENYLEPHRINE 80 MCG/ML (10ML) SYRINGE FOR IV PUSH (FOR BLOOD PRESSURE SUPPORT)
PREFILLED_SYRINGE | INTRAVENOUS | Status: DC | PRN
  Administered 2024-09-17 (×2): 80 ug via INTRAVENOUS

## 2024-09-17 NOTE — Procedures (Signed)
  PROCEDURE: BRONCHOSCOPY Therapeutic Aspiration of Tracheobronchial Tree and Flexible bronchoscopy with bronchoalveolar lavage   PROCEDURE DATE: 09/17/2024  TIME:  NAME:  Rachael Blankenship  DOB:11-12-1956  MRN: [id number] LOC:  [facility]/None    HOSP DAY: @LENGTHOFSTAYDAYS @ CODE STATUS:   Code Status History     Date Active Date Inactive Code Status Order ID Comments User Context   04/21/2024 0852 04/21/2024 1436 Full Code [id number]  Ammon Blunt, MD Inpatient   12/20/2023 0913 12/26/2023 2021 Full Code [id number]  Alto Isaiah CROME, NP ED   05/21/2018 2333 05/23/2018 1720 Full Code [id number]  Stephania Ozell RAMAN Inpatient    Indications/Preliminary Diagnosis:   Consent: (Place X beside choice/s below)  The benefits, risks and possible complications of the procedure were        explained to:  __x_ patient  ___ patient's family  ___ other:___________  who verbalized understanding and gave:  ___ verbal  __x_ written  ___ verbal and written  ___ telephone  ___ other:________ consent.      Unable to obtain consent; procedure performed on emergent basis.     Other:       PRESEDATION ASSESSMENT: History and Physical has been performed. Patient meds and allergies have been reviewed. Presedation airway examination has been performed and documented. Baseline vital signs, sedation score, oxygenation status, and cardiac rhythm were reviewed. Patient was deemed to be in satisfactory condition to undergo the procedure.     PROCEDURE DETAILS: Timeout performed and correct patient, name, & ID confirmed. Following prep per Pulmonary policy, appropriate sedation was administered. The Bronchoscope was inserted in to oral cavity with bite block in place. Therapeutic aspiration of Tracheobronchial tree was performed.  Airway exam proceeded with findings, technical procedures, and specimen collection as noted below. At the end of exam the scope was withdrawn without incident. Impression and Plan as noted  below.           Airway Prep (Place X beside choice below)   1% Transtracheal Lidocaine  Anesthetization 7 cc   Patient prepped per Bronchoscopy Lab Policy       Insertion Route (Place X beside choice below)   Nasal   Oral  x Endotracheal Tube   Tracheostomy   INTRAPROCEDURE MEDICATIONS:  Medication Amt Dose  Medication Amt Dose  Lidocaine  1%  cc  Epinephrine  1:10,000 sol  cc  Xylocaine  4%  cc  Cocaine  cc   TECHNICAL PROCEDURES: (Place X beside choice below)   Procedures  Description    None     Electrocautery     Cryotherapy     Balloon Dilatation     Bronchography     Stent Placement   x  Therapeutic Aspiration bilaterally    Laser/Argon Plasma    Brachytherapy Catheter Placement    Foreign Body Removal         SPECIMENS (Sites): (Place X beside choice below)  Specimens Description   No Specimens Obtained     Washings   x Lavage Right middle lobe lavage   Biopsies    Fine Needle Aspirates    Brushings    Sputum    FINDINGS:   Complete collapse of RML with mucus plugging.  BAL was done on RML with specimens sent for microbiology.   IMPRESSION:POST-PROCEDURE DX:   Awaiting respiratory cultures.   Rawley Harju, M.D.  Pulmonary & Critical Care Medicine  [facility]

## 2024-09-17 NOTE — Anesthesia Preprocedure Evaluation (Addendum)
 Anesthesia Evaluation  Patient identified by MRN, date of birth, ID band Patient awake    Reviewed: Allergy & Precautions, H&P , NPO status , Patient's Chart, lab work & pertinent test results  Airway Mallampati: II  TM Distance: >3 FB Neck ROM: full    Dental no notable dental hx.    Pulmonary shortness of breath, COPD,  COPD inhaler and oxygen  dependent, former smoker 10/25: IMPRESSION: 1. No evidence of pulmonary embolism. 2. Unchanged Complete collapse of the right middle lobe. This is of uncertain etiology. Central obstructing lesion is not excluded. Recommend clinical correlation and follow up. 3. Stable atelectasis in the lingula. 4. Mild emphysema. 5. Aortic atherosclerosis.   2L [place]         Cardiovascular Exercise Tolerance: Poor hypertension, pulmonary hypertensionNormal cardiovascular exam  RHC 5/25: 1.  Normal right sided heart pressures             RA 10/6, 7 mmHg            PA 40/12, 24 mmHg            PCWP 28/27, 20 mmHg 2.  No evidence for pulmonary hypertension (MPA 24 mmHg)  ECHO 1/25:  1. Left ventricular ejection fraction, by estimation, is 60 to 65%. The  left ventricle has normal function. The left ventricle has no regional  wall motion abnormalities. Left ventricular diastolic parameters are  consistent with Grade I diastolic  dysfunction (impaired relaxation).   2. Right ventricular systolic function is normal. The right ventricular  size is normal.   3. The mitral valve is normal in structure. Trivial mitral valve  regurgitation. No evidence of mitral stenosis.   4. The aortic valve is normal in structure. Aortic valve regurgitation is  not visualized. No aortic stenosis is present.   5. The inferior vena cava is normal in size with greater than 50%  respiratory variability, suggesting right atrial pressure of 3 mmHg.     Neuro/Psych  PSYCHIATRIC DISORDERS      negative neurological ROS      GI/Hepatic Neg liver ROS,GERD  ,,  Endo/Other  negative endocrine ROS    Renal/GU      Musculoskeletal  (+) Arthritis ,    Abdominal Normal abdominal exam  (+)   Peds  Hematology negative hematology ROS (+)   Anesthesia Other Findings Past Medical History: No date: Bruises easily No date: Chronic respiratory failure with hypoxia (HCC) No date: Cigarette smoker No date: Collapse of right lung No date: COPD (chronic obstructive pulmonary disease) (HCC) 07/2024: COVID-19 No date: Dependence on supplemental oxygen  No date: Depression with anxiety No date: Endometriosis No date: GERD (gastroesophageal reflux disease) No date: Grade I diastolic dysfunction No date: History of sepsis No date: Hyperlipidemia 06/02/2012: Hypertension No date: Kidney stones No date: Pulmonary HTN (HCC) 03/08/2014: Reactive airway disease No date: Vitamin D  deficiency  Past Surgical History: 12/04/2023: CATARACT EXTRACTION W/PHACO; Right     Comment:  Procedure: CATARACT EXTRACTION PHACO AND INTRAOCULAR               LENS PLACEMENT (IOC) RIGHT  CLAREON VIVITY LENS;                Surgeon: Enola [name], MD;  [facility];  Service: Ophthalmology;  Laterality:               Right;  10.40 1:04.0 12/18/2023: CATARACT EXTRACTION  W/PHACO; Left     Comment:  Procedure: CATARACT EXTRACTION PHACO AND INTRAOCULAR               LENS PLACEMENT (IOC) LEFT  CLAREON VIVITY TORIC 16.28,               01:19.2;  Surgeon: Enola [name], MD;  Location:              [facility];  Service: Ophthalmology;                Laterality: Left; 01/02/2023: COLONOSCOPY WITH PROPOFOL ; N/A     Comment:  Procedure: COLONOSCOPY WITH PROPOFOL ;  Surgeon: Unk Corinn Skiff, MD;  Location: [facility] ENDOSCOPY;  Service:               Gastroenterology;  Laterality: N/A; 07/29/2018: ESOPHAGOGASTRODUODENOSCOPY; N/A     Comment:  Procedure: ESOPHAGOGASTRODUODENOSCOPY  (EGD);  Surgeon:               T[place], Ladell POUR, MD;  Location: [facility] ENDOSCOPY;                Service: Gastroenterology;  Laterality: N/A; 01/02/2023: ESOPHAGOGASTRODUODENOSCOPY (EGD) WITH PROPOFOL ; N/A     Comment:  Procedure: ESOPHAGOGASTRODUODENOSCOPY (EGD) WITH               PROPOFOL ;  Surgeon: Unk Corinn Skiff, MD;  Location:               [facility] ENDOSCOPY;  Service: Gastroenterology;  Laterality:               N/A; No date: KIDNEY STONE SURGERY 04/21/2024: RIGHT HEART CATH; Right     Comment:  Procedure: RIGHT HEART CATH;  Surgeon: Ammon Blunt, MD;  Location: [facility] INVASIVE CV LAB;  Service:              Cardiovascular;  Laterality: Right; No date: TONSILLECTOMY     Reproductive/Obstetrics negative OB ROS                              Anesthesia Physical Anesthesia Plan  ASA: 3  Anesthesia Plan: General ETT   Post-op Pain Management: Minimal or no pain anticipated   Induction: Intravenous  PONV Risk Score and Plan: 2 and Ondansetron  and Dexamethasone  Airway Management Planned: Oral ETT  Additional Equipment:   Intra-op Plan:   Post-operative Plan: Extubation in OR  Informed Consent: I have reviewed the patients History and Physical, chart, labs and discussed the procedure including the risks, benefits and alternatives for the proposed anesthesia with the patient or authorized representative who has indicated his/her understanding and acceptance.     Dental Advisory Given  Plan Discussed with: CRNA and Surgeon  Anesthesia Plan Comments:          Anesthesia Quick Evaluation

## 2024-09-17 NOTE — Anesthesia Procedure Notes (Signed)
 Procedure Name: Intubation Date/Time: 09/17/2024 3:53 PM  Performed by: Lacretia Camelia NOVAK, CRNAPre-anesthesia Checklist: Patient identified, Emergency Drugs available, Suction available and Patient being monitored Patient Re-evaluated:Patient Re-evaluated prior to induction Oxygen  Delivery Method: Circle system utilized Preoxygenation: Pre-oxygenation with 100% oxygen  Induction Type: IV induction Ventilation: Mask ventilation without difficulty Laryngoscope Size: McGrath and 3 Grade View: Grade I Tube type: Oral Tube size: 8.0 mm Number of attempts: 1 Airway Equipment and Method: Stylet and Video-laryngoscopy Placement Confirmation: ETT inserted through vocal cords under direct vision, positive ETCO2 and breath sounds checked- equal and bilateral Secured at: 21 cm Tube secured with: Tape

## 2024-09-17 NOTE — Anesthesia Postprocedure Evaluation (Signed)
 Anesthesia Post Note  Patient: Rachael Blankenship  Procedure(s) Performed: BRONCHOSCOPY, FLEXIBLE IRRIGATION, BRONCHUS  Patient location during evaluation: PACU Anesthesia Type: General Level of consciousness: awake and alert Pain management: pain level controlled Vital Signs Assessment: post-procedure vital signs reviewed and stable Respiratory status: spontaneous breathing, nonlabored ventilation and respiratory function stable Cardiovascular status: blood pressure returned to baseline and stable Postop Assessment: no apparent nausea or vomiting Anesthetic complications: no   No notable events documented.   Last Vitals:  Vitals:   09/17/24 1707 09/17/24 1728  BP: (!) 142/97   Pulse:    Resp: 17   Temp:    SpO2: (!) 87% 90%    Last Pain:  Vitals:   09/17/24 1707  TempSrc:   PainSc: 0-No pain                 [name]

## 2024-09-17 NOTE — H&P (Signed)
 PULMONOLOGY         Date: 09/17/2024,   MRN# [id number] Rachael Blankenship 1955-12-22     AdmissionWeight: 64 kg                 CurrentWeight: 64 kg  Referring provider: Dr Theotis   CHIEF COMPLAINT:   Right middle lobe collapse with mucus plugging   HISTORY OF PRESENT ILLNESS   This is a pleasant 68 year old female with a history of COPD and chronic hypoxemia recent COVID-19 infection last month, depression and anxiety, endometriosis GERD, diastolic dysfunction, history of sepsis in the past, dyslipidemia, pulmonary hypertension, vitamin D  deficiency and kidney stones.  For COVID-19 infection in September 2025 she had worsening respiratory symptoms with outpatient evaluation and imaging was performed showing significant mucous plugging of the tracheobronchial tree bilaterally with collapse of the right middle lobe.  After discussing these findings in clinic and reviewing the imaging we offered bronchoscopy for airway inspection, therapeutic aspiration of tracheobronchial tree and bronchoalveolar lavage to exclude ongoing infection.  I would plan to perform flexible bronchoscopy with therapeutic aspiration of tracheobronchial tree and this will treat her collapsed right middle lobe after removing mucous plugging.  Will also perform bronchoalveolar lavage to check for any postviral bacterial infection.  Reviewed risks/complications and benefits with patient, risks include infection, pneumothorax/pneumomediastinum which may require chest tube placement as well as overnight/prolonged hospitalization and possible mechanical ventilation. Other risks include bleeding and very rarely death.  Patient understands risks and wishes to proceed.  Additional questions were answered, and patient is aware that post procedure patient will be going home with family and may experience cough with possible clots on expectoration as well as phlegm which may last few days as well as hoarseness of voice post  intubation and mechanical ventilation.    PAST MEDICAL HISTORY   Past Medical History:  Diagnosis Date   Bruises easily    Chronic respiratory failure with hypoxia (HCC)    Cigarette smoker    Collapse of right lung    COPD (chronic obstructive pulmonary disease) (HCC)    COVID-19 07/2024   Dependence on supplemental oxygen     Depression with anxiety    Endometriosis    GERD (gastroesophageal reflux disease)    Grade I diastolic dysfunction    History of sepsis    Hyperlipidemia    Hypertension 06/02/2012   Kidney stones    Pulmonary HTN (HCC)    Reactive airway disease 03/08/2014   Vitamin D  deficiency      SURGICAL HISTORY   Past Surgical History:  Procedure Laterality Date   CATARACT EXTRACTION W/PHACO Right 12/04/2023   Procedure: CATARACT EXTRACTION PHACO AND INTRAOCULAR LENS PLACEMENT (IOC) RIGHT  CLAREON VIVITY LENS;  Surgeon: Enola [name], MD;  Location: [facility];  Service: Ophthalmology;  Laterality: Right;  10.40 1:04.0   CATARACT EXTRACTION W/PHACO Left 12/18/2023   Procedure: CATARACT EXTRACTION PHACO AND INTRAOCULAR LENS PLACEMENT (IOC) LEFT  CLAREON VIVITY TORIC 16.28, 01:19.2;  Surgeon: Enola [name], MD;  Location: [facility];  Service: Ophthalmology;  Laterality: Left;   COLONOSCOPY WITH PROPOFOL  N/A 01/02/2023   Procedure: COLONOSCOPY WITH PROPOFOL ;  Surgeon: Unk Corinn Skiff, MD;  Location: [facility];  Service: Gastroenterology;  Laterality: N/A;   ESOPHAGOGASTRODUODENOSCOPY N/A 07/29/2018   Procedure: ESOPHAGOGASTRODUODENOSCOPY (EGD);  Surgeon: Toledo, Ladell MARLA, MD;  Location: [facility] END[facility];  Service: Gastroenterology;  Laterality: N/A;   ESOPHAGOGASTRODUODENOSCOPY (EGD) WITH PROPOFOL  N/A 01/02/2023   Procedure: ESOPHAGOGASTRODUODENOSCOPY (EGD) WITH  PROPOFOL ;  Surgeon: Unk Corinn Skiff, MD;  Location: [facility];  Service: Gastroenterology;  Laterality: N/A;   KIDNEY STONE SURGERY     RIGHT HEART CATH  Right 04/21/2024   Procedure: RIGHT HEART CATH;  Surgeon: Ammon Blunt, MD;  Location: [facility] IN[facility]AS[facility];  Service: Cardiovascular;  Laterality: Right;   TONSILLECTOMY       FAMILY HISTORY   Family History  Problem Relation Age of Onset   Hypertension Mother    Hyperlipidemia Mother    GER disease Mother    Heart attack Father        23 yrs ago   Hypertension Father    Parkinson's disease Father    GER disease Father      SOCIAL HISTORY   Social History   Tobacco Use   Smoking status: Former    Current packs/day: 0.00    Average packs/day: 0.5 packs/day for 50.1 years (26.8 ttl pk-yrs)    Types: Cigarettes    Start date: 55    Quit date: 12/18/2023    Years since quitting: 0.7   Smokeless tobacco: Never  Vaping Use   Vaping status: Former  Substance Use Topics   Alcohol use: Yes    Comment: ocassionally    Drug use: No     MEDICATIONS    Home Medication:    Current Medication:  Current Facility-Administered Medications:    chlorhexidine  (PERIDEX ) 0.12 % solution 15 mL, 15 mL, Mouth/Throat, Once **OR** Oral care mouth rinse, 15 mL, Mouth Rinse, Once, Piscitello, Fairy POUR, MD   lactated ringers infusion, , Intravenous, Continuous, Piscitello, Fairy POUR, MD    ALLERGIES   Naproxen and Sulfa antibiotics     REVIEW OF SYSTEMS    Review of Systems:  Gen:  Denies  fever, sweats, chills weigh loss  HEENT: Denies blurred vision, double vision, ear pain, eye pain, hearing loss, nose bleeds, sore throat Cardiac:  No dizziness, chest pain or heaviness, chest tightness,edema Resp:   reports dyspnea chronically  Gi: Denies swallowing difficulty, stomach pain, nausea or vomiting, diarrhea, constipation, bowel incontinence Gu:  Denies bladder incontinence, burning urine Ext:   Denies Joint pain, stiffness or swelling Skin: Denies  skin rash, easy bruising or bleeding or hives Endoc:  Denies polyuria, polydipsia , polyphagia or weight  change Psych:   Denies depression, insomnia or hallucinations   Other:  All other systems negative   VS: BP (!) 176/74   Pulse 72   Temp (!) 97.3 F (36.3 C) (Temporal)   Resp 18   Ht 5' 3 (1.6 m)   Wt 64 kg   SpO2 97%   BMI 24.98 kg/m      PHYSICAL EXAM    GENERAL:NAD, no fevers, chills, no weakness no fatigue HEAD: Normocephalic, atraumatic.  EYES: Pupils equal, round, reactive to light. Extraocular muscles intact. No scleral icterus.  MOUTH: Moist mucosal membrane. Dentition intact. No abscess noted.  EAR, NOSE, THROAT: Clear without exudates. No external lesions.  NECK: Supple. No thyromegaly. No nodules. No JVD.  PULMONARY: decreased breath sounds with mild rhonchi worse at bases bilaterally.  CARDIOVASCULAR: S1 and S2. Regular rate and rhythm. No murmurs, rubs, or gallops. No edema. Pedal pulses 2+ bilaterally.  GASTROINTESTINAL: Soft, nontender, nondistended. No masses. Positive bowel sounds. No hepatosplenomegaly.  MUSCULOSKELETAL: No swelling, clubbing, or edema. Range of motion full in all extremities.  NEUROLOGIC: Cranial nerves II through XII are intact. No gross focal neurological deficits. Sensation intact. Reflexes intact.  SKIN: No ulceration,  lesions, rashes, or cyanosis. Skin warm and dry. Turgor intact.  PSYCHIATRIC: Mood, affect within normal limits. The patient is awake, alert and oriented x 3. Insight, judgment intact.       IMAGING   Narrative & Impression  EXAM: CTA of the Chest with contrast for PE 09/06/2024 05:58:34 PM   TECHNIQUE: CTA of the chest was performed without and with the administration of 75 mL of iohexol  (OMNIPAQUE ) 350 MG/ML injection. Multiplanar reformatted images are provided for review. MIP images are provided for review. Automated exposure control, iterative reconstruction, and/or weight based adjustment of the mA/kV was utilized to reduce the radiation dose to as low as reasonably achievable.   COMPARISON: CT chest  without contrast 1524. CT of the chest 621 2016.   CLINICAL HISTORY: Acute chest pain R07.9 (ICD-10-CM); Positive D-dimer R79.89 (ICD-10-CM)   FINDINGS:   PULMONARY ARTERIES: Pulmonary arteries are adequately opacified for evaluation. No pulmonary embolism. Main pulmonary artery is normal in caliber.   MEDIASTINUM: The heart and pericardium demonstrate no acute abnormality. Mild atherosclerotic calcifications of the aorta.   LYMPH NODES: No mediastinal, hilar or axillary lymphadenopathy.   LUNGS AND PLEURA: Mild emphysema is present. There is stable atelectasis in the lingula. There is complete collapse of the right middle lobe unchanged from the prior study. No focal consolidation or pulmonary edema. No pleural effusion or pneumothorax.   UPPER ABDOMEN: Limited images of the upper abdomen are unremarkable.   SOFT TISSUES AND BONES: No acute bone or soft tissue abnormality.   IMPRESSION: 1. No evidence of pulmonary embolism. 2. Unchanged Complete collapse of the right middle lobe. This is of uncertain etiology. Central obstructing lesion is not excluded. Recommend clinical correlation and follow up. 3. Stable atelectasis in the lingula. 4. Mild emphysema. 5. Aortic atherosclerosis.   Electronically signed by: Greig Pique MD 09/06/2024 06:33 PM EDT RP Workstation: [id number]       ASSESSMENT/PLAN   Mucus plugging of tracheobronchial tree with RML collapse  After discussing these findings in clinic and reviewing the imaging we offered bronchoscopy for airway inspection, therapeutic aspiration of tracheobronchial tree and bronchoalveolar lavage to exclude ongoing infection.  I would plan to perform flexible bronchoscopy with therapeutic aspiration of tracheobronchial tree and this will treat her collapsed right middle lobe after removing mucous plugging.  Will also perform bronchoalveolar lavage to check for any postviral bacterial infection.  Reviewed risks/complications  and benefits with patient, risks include infection, pneumothorax/pneumomediastinum which may require chest tube placement as well as overnight/prolonged hospitalization and possible mechanical ventilation. Other risks include bleeding and very rarely death.  Patient understands risks and wishes to proceed.  Additional questions were answered, and patient is aware that post procedure patient will be going home with family and may experience cough with possible clots on expectoration as well as phlegm which may last few days as well as hoarseness of voice post intubation and mechanical ventilation.            Thank you for allowing me to participate in the care of this patient.   Patient/Family are satisfied with care plan and all questions have been answered.    Provider disclosure: Patient with at least one acute or chronic illness or injury that poses a threat to life or bodily function and is being managed actively during this encounter.  All of the below services have been performed independently by signing provider:  review of prior documentation from internal and or external health records.  Review of previous and  current lab results.  Interview and comprehensive assessment during patient visit today. Review of current and previous chest radiographs/CT scans. Discussion of management and test interpretation with health care team and patient/family.   This document was prepared using Dragon voice recognition software and may include unintentional dictation errors.     Cailah Reach, M.D.  Division of Pulmonary & Critical Care Medicine

## 2024-09-17 NOTE — Progress Notes (Signed)
 Per Dr. Parris, ok to d/c pt on 2 L O2 with O2 sats at 88% and above

## 2024-09-17 NOTE — Transfer of Care (Signed)
 Immediate Anesthesia Transfer of Care Note  Patient: Rachael Blankenship  Procedure(s) Performed: BRONCHOSCOPY, FLEXIBLE IRRIGATION, BRONCHUS  Patient Location: PACU  Anesthesia Type:General  Level of Consciousness: awake and patient cooperative  Airway & Oxygen  Therapy: Patient Spontanous Breathing and Patient connected to face mask oxygen   Post-op Assessment: Report given to RN and Post -op Vital signs reviewed and stable  Post vital signs: stable  Last Vitals:  Vitals Value Taken Time  BP    Temp    Pulse    Resp    SpO2      Last Pain:  Vitals:   09/17/24 1241  TempSrc: Temporal  PainSc: 0-No pain         Complications: No notable events documented.

## 2024-09-18 ENCOUNTER — Encounter: Payer: Self-pay | Admitting: Pulmonary Disease

## 2024-09-19 LAB — CULTURE, BAL-QUANTITATIVE W GRAM STAIN: Culture: NO GROWTH

## 2024-09-20 ENCOUNTER — Encounter: Payer: Self-pay | Admitting: Pulmonary Disease

## 2024-09-28 ENCOUNTER — Inpatient Hospital Stay
Admission: EM | Admit: 2024-09-28 | Discharge: 2024-10-02 | DRG: 193 | Disposition: A | Discharge status: Home Health | Attending: Internal Medicine | Admitting: Internal Medicine

## 2024-09-28 DIAGNOSIS — M4804 Spinal stenosis, thoracic region: Secondary | ICD-10-CM | POA: Diagnosis not present

## 2024-09-28 DIAGNOSIS — J9622 Acute and chronic respiratory failure with hypercapnia: Secondary | ICD-10-CM | POA: Diagnosis present

## 2024-09-28 DIAGNOSIS — R0603 Acute respiratory distress: Secondary | ICD-10-CM | POA: Diagnosis not present

## 2024-09-28 DIAGNOSIS — Z886 Allergy status to analgesic agent status: Secondary | ICD-10-CM

## 2024-09-28 DIAGNOSIS — J441 Chronic obstructive pulmonary disease with (acute) exacerbation: Principal | ICD-10-CM | POA: Diagnosis present

## 2024-09-28 DIAGNOSIS — U099 Post covid-19 condition, unspecified: Secondary | ICD-10-CM | POA: Diagnosis present

## 2024-09-28 DIAGNOSIS — E785 Hyperlipidemia, unspecified: Secondary | ICD-10-CM | POA: Diagnosis present

## 2024-09-28 DIAGNOSIS — J440 Chronic obstructive pulmonary disease with acute lower respiratory infection: Secondary | ICD-10-CM | POA: Diagnosis present

## 2024-09-28 DIAGNOSIS — J9601 Acute respiratory failure with hypoxia: Secondary | ICD-10-CM | POA: Diagnosis present

## 2024-09-28 DIAGNOSIS — M4854XA Collapsed vertebra, not elsewhere classified, thoracic region, initial encounter for fracture: Secondary | ICD-10-CM | POA: Diagnosis present

## 2024-09-28 DIAGNOSIS — I110 Hypertensive heart disease with heart failure: Secondary | ICD-10-CM | POA: Diagnosis present

## 2024-09-28 DIAGNOSIS — Z87442 Personal history of urinary calculi: Secondary | ICD-10-CM

## 2024-09-28 DIAGNOSIS — J989 Respiratory disorder, unspecified: Secondary | ICD-10-CM | POA: Diagnosis present

## 2024-09-28 DIAGNOSIS — Z9981 Dependence on supplemental oxygen: Secondary | ICD-10-CM

## 2024-09-28 DIAGNOSIS — E876 Hypokalemia: Secondary | ICD-10-CM | POA: Diagnosis present

## 2024-09-28 DIAGNOSIS — I10 Essential (primary) hypertension: Secondary | ICD-10-CM | POA: Diagnosis not present

## 2024-09-28 DIAGNOSIS — M40209 Unspecified kyphosis, site unspecified: Secondary | ICD-10-CM | POA: Diagnosis present

## 2024-09-28 DIAGNOSIS — J439 Emphysema, unspecified: Secondary | ICD-10-CM | POA: Diagnosis present

## 2024-09-28 DIAGNOSIS — J9811 Atelectasis: Secondary | ICD-10-CM | POA: Diagnosis present

## 2024-09-28 DIAGNOSIS — F418 Other specified anxiety disorders: Secondary | ICD-10-CM | POA: Diagnosis not present

## 2024-09-28 DIAGNOSIS — F411 Generalized anxiety disorder: Secondary | ICD-10-CM | POA: Diagnosis present

## 2024-09-28 DIAGNOSIS — Z79899 Other long term (current) drug therapy: Secondary | ICD-10-CM

## 2024-09-28 DIAGNOSIS — K219 Gastro-esophageal reflux disease without esophagitis: Secondary | ICD-10-CM | POA: Diagnosis present

## 2024-09-28 DIAGNOSIS — E860 Dehydration: Secondary | ICD-10-CM | POA: Diagnosis present

## 2024-09-28 DIAGNOSIS — R609 Edema, unspecified: Secondary | ICD-10-CM | POA: Diagnosis not present

## 2024-09-28 DIAGNOSIS — F17210 Nicotine dependence, cigarettes, uncomplicated: Secondary | ICD-10-CM | POA: Diagnosis present

## 2024-09-28 DIAGNOSIS — Z882 Allergy status to sulfonamides status: Secondary | ICD-10-CM

## 2024-09-28 DIAGNOSIS — F419 Anxiety disorder, unspecified: Secondary | ICD-10-CM | POA: Diagnosis present

## 2024-09-28 DIAGNOSIS — J9621 Acute and chronic respiratory failure with hypoxia: Principal | ICD-10-CM | POA: Diagnosis present

## 2024-09-28 DIAGNOSIS — R5381 Other malaise: Secondary | ICD-10-CM | POA: Diagnosis present

## 2024-09-28 DIAGNOSIS — Z820 Family history of epilepsy and other diseases of the nervous system: Secondary | ICD-10-CM

## 2024-09-28 DIAGNOSIS — Z79620 Long term (current) use of immunosuppressive biologic: Secondary | ICD-10-CM

## 2024-09-28 DIAGNOSIS — N179 Acute kidney failure, unspecified: Secondary | ICD-10-CM | POA: Diagnosis present

## 2024-09-28 DIAGNOSIS — J449 Chronic obstructive pulmonary disease, unspecified: Secondary | ICD-10-CM | POA: Diagnosis not present

## 2024-09-28 DIAGNOSIS — I5032 Chronic diastolic (congestive) heart failure: Secondary | ICD-10-CM | POA: Diagnosis present

## 2024-09-28 DIAGNOSIS — Z8249 Family history of ischemic heart disease and other diseases of the circulatory system: Secondary | ICD-10-CM

## 2024-09-28 DIAGNOSIS — I272 Pulmonary hypertension, unspecified: Secondary | ICD-10-CM | POA: Diagnosis present

## 2024-09-28 DIAGNOSIS — J189 Pneumonia, unspecified organism: Principal | ICD-10-CM | POA: Diagnosis present

## 2024-09-28 DIAGNOSIS — R0600 Dyspnea, unspecified: Secondary | ICD-10-CM | POA: Diagnosis not present

## 2024-09-28 DIAGNOSIS — I493 Ventricular premature depolarization: Secondary | ICD-10-CM | POA: Diagnosis present

## 2024-09-28 DIAGNOSIS — I700 Atherosclerosis of aorta: Secondary | ICD-10-CM | POA: Diagnosis not present

## 2024-09-28 DIAGNOSIS — F32A Depression, unspecified: Secondary | ICD-10-CM | POA: Diagnosis present

## 2024-09-28 DIAGNOSIS — R000 Tachycardia, unspecified: Secondary | ICD-10-CM | POA: Diagnosis not present

## 2024-09-28 DIAGNOSIS — R918 Other nonspecific abnormal finding of lung field: Secondary | ICD-10-CM | POA: Diagnosis not present

## 2024-09-28 DIAGNOSIS — Z83438 Family history of other disorder of lipoprotein metabolism and other lipidemia: Secondary | ICD-10-CM

## 2024-09-28 DIAGNOSIS — Z7982 Long term (current) use of aspirin: Secondary | ICD-10-CM

## 2024-09-28 DIAGNOSIS — R069 Unspecified abnormalities of breathing: Secondary | ICD-10-CM | POA: Diagnosis not present

## 2024-09-28 DIAGNOSIS — R29890 Loss of height: Secondary | ICD-10-CM | POA: Diagnosis not present

## 2024-09-28 DIAGNOSIS — S22050A Wedge compression fracture of T5-T6 vertebra, initial encounter for closed fracture: Secondary | ICD-10-CM | POA: Diagnosis not present

## 2024-09-28 DIAGNOSIS — R062 Wheezing: Secondary | ICD-10-CM | POA: Diagnosis not present

## 2024-09-28 LAB — CBC WITH DIFFERENTIAL/PLATELET
Abs Immature Granulocytes: 0.08 K/uL — ABNORMAL HIGH (ref 0.00–0.07)
Basophils Absolute: 0.1 K/uL (ref 0.0–0.1)
Basophils Relative: 0 %
Eosinophils Absolute: 0.4 K/uL (ref 0.0–0.5)
Eosinophils Relative: 3 %
HCT: 38.2 % (ref 36.0–46.0)
Hemoglobin: 12.1 g/dL (ref 12.0–15.0)
Immature Granulocytes: 1 %
Lymphocytes Relative: 25 %
Lymphs Abs: 3.7 K/uL (ref 0.7–4.0)
MCH: 27.6 pg (ref 26.0–34.0)
MCHC: 31.7 g/dL (ref 30.0–36.0)
MCV: 87 fL (ref 80.0–100.0)
Monocytes Absolute: 1.6 K/uL — ABNORMAL HIGH (ref 0.1–1.0)
Monocytes Relative: 11 %
Neutro Abs: 8.9 K/uL — ABNORMAL HIGH (ref 1.7–7.7)
Neutrophils Relative %: 60 %
Platelets: 463 K/uL — ABNORMAL HIGH (ref 150–400)
RBC: 4.39 MIL/uL (ref 3.87–5.11)
RDW: 13.6 % (ref 11.5–15.5)
WBC: 14.8 K/uL — ABNORMAL HIGH (ref 4.0–10.5)
nRBC: 0 % (ref 0.0–0.2)

## 2024-09-28 MED ORDER — IPRATROPIUM-ALBUTEROL 0.5-2.5 (3) MG/3ML IN SOLN
6.0000 mL | Freq: Once | RESPIRATORY_TRACT | Status: AC
Start: 2024-09-29 — End: 2024-09-28
  Administered 2024-09-28: 6 mL via RESPIRATORY_TRACT
  Filled 2024-09-28: qty 3

## 2024-09-28 MED ORDER — IPRATROPIUM-ALBUTEROL 0.5-2.5 (3) MG/3ML IN SOLN
6.0000 mL | Freq: Once | RESPIRATORY_TRACT | Status: DC
  Filled 2024-09-28: qty 3

## 2024-09-28 NOTE — ED Triage Notes (Signed)
 From home c/o dyspnea while getting ready for bed. Hx of COPD on 2L/East  at home. Pox 75% for EMS-- placed on CPaP and given solumedrol and duoneb x 2 en route. Presents alert, answering questions appropriately, +labored breathing.

## 2024-09-28 NOTE — ED Provider Notes (Incomplete)
 [facility] Provider Note    Event Date/Time   First MD Initiated Contact with Patient 09/28/24 2345     (approximate)   History   Shortness of Breath   HPI  Rachael Blankenship is a 68 y.o. female   Past medical history of COPD complicated by COVID-19 infection in September 2025, with a recent bronchoscopy and therapeutic aspiration of tracheobronchial tree and bronchial alveolar lavage due to ongoing COVID symptoms, performed on 09/17/2024, chronic hypoxemic respiratory failure on 2 L nasal cannula chronically, depression anxiety, GERD, diastolic heart dysfunction, dyslipidemia and pulmonary hypertension here with respiratory distress.  Has been dealing with shortness of breath ever since her COVID infection but worsened over the last couple of days.  Tonight her shortness of breath got worse and she called EMS.  She has ongoing mild cough unchanged in the last several weeks.  She has no chest pain.  EMS reports that they gave a DuoNeb, and put her on CPAP with some relief of symptoms.  They also gave 2 g of IV magnesium  as well as 125 mg of IV Solu-Medrol  en route.  Independent Historian contributed to assessment above: EMS providers as above  External Medical Documents Reviewed: Bronchoscopy note from pulmonary medicine on 09/17/2024      Physical Exam   Triage Vital Signs: ED Triage Vitals  Encounter Vitals Group     BP      Girls Systolic BP Percentile      Girls Diastolic BP Percentile      Boys Systolic BP Percentile      Boys Diastolic BP Percentile      Pulse      Resp      Temp      Temp src      SpO2      Weight      Height      Head Circumference      Peak Flow      Pain Score      Pain Loc      Pain Education      Exclude from Growth Chart     Most recent vital signs: Vitals:   09/28/24 2346 09/28/24 2350  BP: 138/62   Pulse: 94 92  Resp: (!) 26 (!) 23  Temp: 97.7 F (36.5 C)   SpO2: 100% 100%     General: Awake CV:  Good peripheral perfusion.  Resp:  Increased work of breathing, respiratory distress, speaking in short phrases, on CPAP.  Wheezing in all lung fields.  No focality.  Lung sounds evident bilaterally. Abd:  No distention.  Other:  No peripheral edema noted.   ED Results / Procedures / Treatments   Labs (all labs ordered are listed, but only abnormal results are displayed) Labs Reviewed  CBC WITH DIFFERENTIAL/PLATELET - Abnormal; Notable for the following components:      Result Value   WBC 14.8 (*)    Platelets 463 (*)    Neutro Abs 8.9 (*)    Monocytes Absolute 1.6 (*)    Abs Immature Granulocytes 0.08 (*)    All other components within normal limits  BASIC METABOLIC PANEL WITH GFR - Abnormal; Notable for the following components:   Potassium 2.9 (*)    Chloride 91 (*)    CO2 35 (*)    Glucose, Bld 188 (*)    Creatinine, Ser 1.11 (*)    GFR, Estimated 53 (*)    All other components within normal limits  BLOOD GAS, VENOUS - Abnormal; Notable for the following components:   Bicarbonate 50.2 (*)    Acid-Base Excess 20.6 (*)    All other components within normal limits  MAGNESIUM  - Abnormal; Notable for the following components:   Magnesium  3.9 (*)    All other components within normal limits  RESP PANEL BY RT-PCR (RSV, FLU A&B, COVID)  [id number]  BLOOD GAS, VENOUS  BLOOD GAS, VENOUS  TROPONIN I (HIGH SENSITIVITY)     I ordered and reviewed the above labs they are notable for hypokalemia.  VBG shows normal pH.  Leukocytosis 14.8.  EKG  ED ECG REPORT I, Ginnie Shams, the attending physician, personally viewed and interpreted this ECG.   Date: 09/28/2024  EKG Time: 2350  Rate: 95  Rhythm: sinus  Axis: nl  Intervals:nl   ST&T Change: no stemi, occasional PVC    RADIOLOGY I independently reviewed and interpreted chest x-ray and I see no obvious focality or pneumothorax I also reviewed radiologist's formal read.   PROCEDURES:  Critical Care  performed: Yes, see critical care procedure note(s)  .Critical Care  Performed by: Shams Ginnie, MD Authorized by: Shams Ginnie, MD   Critical care provider statement:    Critical care time (minutes):  35   Critical care was time spent personally by me on the following activities:  Development of treatment plan with patient or surrogate, discussions with consultants, evaluation of patient's response to treatment, examination of patient, ordering and review of laboratory studies, ordering and review of radiographic studies, ordering and performing treatments and interventions, pulse oximetry, re-evaluation of patient's condition and review of old charts    MEDICATIONS ORDERED IN ED: Medications  ipratropium-albuterol  (DUONEB) 0.5-2.5 (3) MG/3ML nebulizer solution 6 mL (has no administration in time range)  potassium chloride  10 mEq in 100 mL IVPB (has no administration in time range)  ipratropium-albuterol  (DUONEB) 0.5-2.5 (3) MG/3ML nebulizer solution 6 mL (6 mLs Nebulization Given 09/28/24 2352)    External physician / consultants:  I spoke with hospital medicine for admission and regarding care plan for this patient.   IMPRESSION / MDM / ASSESSMENT AND PLAN / ED COURSE  I reviewed the triage vital signs and the nursing notes.                                Patient's presentation is most consistent with acute presentation with potential threat to life or bodily function.  Differential diagnosis includes, but is not limited to, COPD exacerbation, acute hypoxemic respiratory failure, respiratory infection, pneumothorax, considered but less likely ACS, PE   The patient is on the cardiac monitor to evaluate for evidence of arrhythmia and/or significant heart rate changes.  MDM:    COPD exacerbation quite severe with hypoxemic respiratory failure requiring increased oxygen , BiPAP for respiratory distress, as well as standard COPD treatment.  Starting to feel better.  In the setting of  prolonged COVID symptoms when initially diagnosed in September.  Check chest x-ray, labs, for superimposed bacterial infection.  No chest pain to suggest ACS.  Admission.   -- No acidosis on VBG.  Leukocytosis, nonspecific, with no worsening productive sputum, no focal consolidation on chest x-ray, no obvious evidence of bacterial pneumonia at this time so we will defer antibiotics.  Respiratory status improving on BiPAP.  Hypokalemia, IV repletion ordered.  Added on magnesium  level, which appears to be high, wonder if it may have been due to the recently administered  IV magnesium  by EMS.    Will keep on BiPAP and admit.       FINAL CLINICAL IMPRESSION(S) / ED DIAGNOSES   Final diagnoses:  COPD exacerbation (HCC)  Acute hypoxemic respiratory failure (HCC)  Respiratory distress  Hypokalemia  Hypermagnesemia     Rx / DC Orders   ED Discharge Orders     None        Note:  This document was prepared using Dragon voice recognition software and may include unintentional dictation errors.    Cyrena Mylar, MD 09/29/24 GLORIANNE    Cyrena Mylar, MD 09/29/24 [phone]

## 2024-09-29 ENCOUNTER — Other Ambulatory Visit: Payer: Self-pay

## 2024-09-29 ENCOUNTER — Inpatient Hospital Stay

## 2024-09-29 ENCOUNTER — Encounter: Payer: Self-pay | Admitting: Internal Medicine

## 2024-09-29 ENCOUNTER — Emergency Department

## 2024-09-29 DIAGNOSIS — E860 Dehydration: Secondary | ICD-10-CM | POA: Diagnosis present

## 2024-09-29 DIAGNOSIS — R918 Other nonspecific abnormal finding of lung field: Secondary | ICD-10-CM | POA: Diagnosis not present

## 2024-09-29 DIAGNOSIS — J189 Pneumonia, unspecified organism: Secondary | ICD-10-CM | POA: Diagnosis not present

## 2024-09-29 DIAGNOSIS — S22050A Wedge compression fracture of T5-T6 vertebra, initial encounter for closed fracture: Secondary | ICD-10-CM | POA: Diagnosis not present

## 2024-09-29 DIAGNOSIS — I272 Pulmonary hypertension, unspecified: Secondary | ICD-10-CM | POA: Diagnosis present

## 2024-09-29 DIAGNOSIS — Z9981 Dependence on supplemental oxygen: Secondary | ICD-10-CM | POA: Diagnosis not present

## 2024-09-29 DIAGNOSIS — E876 Hypokalemia: Secondary | ICD-10-CM | POA: Diagnosis present

## 2024-09-29 DIAGNOSIS — F411 Generalized anxiety disorder: Secondary | ICD-10-CM | POA: Diagnosis present

## 2024-09-29 DIAGNOSIS — J9622 Acute and chronic respiratory failure with hypercapnia: Secondary | ICD-10-CM | POA: Diagnosis present

## 2024-09-29 DIAGNOSIS — F32A Depression, unspecified: Secondary | ICD-10-CM | POA: Diagnosis present

## 2024-09-29 DIAGNOSIS — J989 Respiratory disorder, unspecified: Secondary | ICD-10-CM | POA: Diagnosis present

## 2024-09-29 DIAGNOSIS — I110 Hypertensive heart disease with heart failure: Secondary | ICD-10-CM | POA: Diagnosis present

## 2024-09-29 DIAGNOSIS — I10 Essential (primary) hypertension: Secondary | ICD-10-CM

## 2024-09-29 DIAGNOSIS — E785 Hyperlipidemia, unspecified: Secondary | ICD-10-CM | POA: Diagnosis present

## 2024-09-29 DIAGNOSIS — N179 Acute kidney failure, unspecified: Secondary | ICD-10-CM | POA: Diagnosis present

## 2024-09-29 DIAGNOSIS — F418 Other specified anxiety disorders: Secondary | ICD-10-CM | POA: Diagnosis not present

## 2024-09-29 DIAGNOSIS — I5032 Chronic diastolic (congestive) heart failure: Secondary | ICD-10-CM | POA: Diagnosis present

## 2024-09-29 DIAGNOSIS — F17210 Nicotine dependence, cigarettes, uncomplicated: Secondary | ICD-10-CM | POA: Diagnosis present

## 2024-09-29 DIAGNOSIS — M4804 Spinal stenosis, thoracic region: Secondary | ICD-10-CM | POA: Diagnosis not present

## 2024-09-29 DIAGNOSIS — J440 Chronic obstructive pulmonary disease with acute lower respiratory infection: Secondary | ICD-10-CM | POA: Diagnosis present

## 2024-09-29 DIAGNOSIS — J441 Chronic obstructive pulmonary disease with (acute) exacerbation: Principal | ICD-10-CM | POA: Diagnosis present

## 2024-09-29 DIAGNOSIS — R5381 Other malaise: Secondary | ICD-10-CM | POA: Diagnosis present

## 2024-09-29 DIAGNOSIS — U099 Post covid-19 condition, unspecified: Secondary | ICD-10-CM | POA: Diagnosis present

## 2024-09-29 DIAGNOSIS — J439 Emphysema, unspecified: Secondary | ICD-10-CM | POA: Diagnosis not present

## 2024-09-29 DIAGNOSIS — R609 Edema, unspecified: Secondary | ICD-10-CM | POA: Diagnosis not present

## 2024-09-29 DIAGNOSIS — J449 Chronic obstructive pulmonary disease, unspecified: Secondary | ICD-10-CM | POA: Diagnosis not present

## 2024-09-29 DIAGNOSIS — J9621 Acute and chronic respiratory failure with hypoxia: Secondary | ICD-10-CM

## 2024-09-29 DIAGNOSIS — K219 Gastro-esophageal reflux disease without esophagitis: Secondary | ICD-10-CM | POA: Diagnosis present

## 2024-09-29 DIAGNOSIS — R29890 Loss of height: Secondary | ICD-10-CM | POA: Diagnosis not present

## 2024-09-29 DIAGNOSIS — I700 Atherosclerosis of aorta: Secondary | ICD-10-CM | POA: Diagnosis not present

## 2024-09-29 DIAGNOSIS — M4854XA Collapsed vertebra, not elsewhere classified, thoracic region, initial encounter for fracture: Secondary | ICD-10-CM | POA: Diagnosis not present

## 2024-09-29 DIAGNOSIS — J9811 Atelectasis: Secondary | ICD-10-CM | POA: Diagnosis present

## 2024-09-29 LAB — BASIC METABOLIC PANEL WITH GFR
Anion gap: 10 (ref 5–15)
Anion gap: 14 (ref 5–15)
BUN: 21 mg/dL (ref 8–23)
BUN: 23 mg/dL (ref 8–23)
CO2: 34 mmol/L — ABNORMAL HIGH (ref 22–32)
CO2: 35 mmol/L — ABNORMAL HIGH (ref 22–32)
Calcium: 8.7 mg/dL — ABNORMAL LOW (ref 8.9–10.3)
Calcium: 9 mg/dL (ref 8.9–10.3)
Chloride: 91 mmol/L — ABNORMAL LOW (ref 98–111)
Chloride: 95 mmol/L — ABNORMAL LOW (ref 98–111)
Creatinine, Ser: 0.77 mg/dL (ref 0.44–1.00)
Creatinine, Ser: 1.11 mg/dL — ABNORMAL HIGH (ref 0.44–1.00)
GFR, Estimated: 53 mL/min — ABNORMAL LOW (ref >60)
GFR, Estimated: >60 mL/min (ref >60)
Glucose, Bld: 182 mg/dL — ABNORMAL HIGH (ref 70–99)
Glucose, Bld: 188 mg/dL — ABNORMAL HIGH (ref 70–99)
Potassium: 2.9 mmol/L — ABNORMAL LOW (ref 3.5–5.1)
Potassium: 3.8 mmol/L (ref 3.5–5.1)
Sodium: 139 mmol/L (ref 135–145)
Sodium: 140 mmol/L (ref 135–145)

## 2024-09-29 LAB — MAGNESIUM: Magnesium: 3.9 mg/dL — ABNORMAL HIGH (ref 1.7–2.4)

## 2024-09-29 LAB — BLOOD GAS, VENOUS
Acid-Base Excess: 15.6 mmol/L — ABNORMAL HIGH (ref 0.0–2.0)
Bicarbonate: 43.5 mmol/L — ABNORMAL HIGH (ref 20.0–28.0)
Bicarbonate: 50.2 mmol/L — ABNORMAL HIGH (ref 20.0–28.0)
O2 Saturation: 89.1 %
Patient temperature: 37
Patient temperature: 37
Patient temperature: 37 mmol/L — AB (ref 0.0–2.0)
pCO2, Ven: 67 mmHg — ABNORMAL HIGH (ref 44–60)
pH, Ven: 7.4 (ref 7.25–7.43)
pH, Ven: 7.42 (ref 7.25–7.43)
pO2, Ven: 38 mmHg (ref 44–60)
pO2, Ven: 50.2 mmHg — AB (ref 32–45)
pO2, Ven: 59 mmHg — ABNORMAL HIGH (ref 32–45)

## 2024-09-29 LAB — RESP PANEL BY RT-PCR (RSV, FLU A&B, COVID)  RVPGX2
Influenza A by PCR: NEGATIVE
Influenza B by PCR: NEGATIVE
Resp Syncytial Virus by PCR: NEGATIVE
SARS Coronavirus 2 by RT PCR: NEGATIVE

## 2024-09-29 LAB — RESPIRATORY PANEL BY PCR

## 2024-09-29 LAB — PHOSPHORUS: Phosphorus: 4.2 mg/dL (ref 2.5–4.6)

## 2024-09-29 LAB — D-DIMER, QUANTITATIVE: D-Dimer, Quant: 0.72 ug{FEU}/mL — ABNORMAL HIGH (ref 0.00–0.50)

## 2024-09-29 LAB — BRAIN NATRIURETIC PEPTIDE: B Natriuretic Peptide: 28 pg/mL (ref 0.0–100.0)

## 2024-09-29 LAB — CBC
HCT: 35.2 % — ABNORMAL LOW (ref 36.0–46.0)
Hemoglobin: 10.9 g/dL — ABNORMAL LOW (ref 12.0–15.0)
MCH: 27 pg (ref 26.0–34.0)
MCHC: 31 g/dL (ref 30.0–36.0)
MCV: 87.3 fL (ref 80.0–100.0)
Platelets: 388 K/uL (ref 150–400)
RBC: 4.03 MIL/uL (ref 3.87–5.11)
RDW: 13.5 % (ref 11.5–15.5)
WBC: 10.9 K/uL — ABNORMAL HIGH (ref 4.0–10.5)
nRBC: 0 % (ref 0.0–0.2)

## 2024-09-29 LAB — TROPONIN I (HIGH SENSITIVITY): Troponin I (High Sensitivity): 8 ng/L (ref <18)

## 2024-09-29 MED ORDER — DULOXETINE HCL 30 MG PO CPEP
60.0000 mg | ORAL_CAPSULE | ORAL | Status: DC
  Administered 2024-09-29 – 2024-10-02 (×4): 60 mg via ORAL
  Filled 2024-09-29: qty 1
  Filled 2024-09-29 (×3): qty 2

## 2024-09-29 MED ORDER — IOHEXOL 350 MG/ML SOLN
75.0000 mL | Freq: Once | INTRAVENOUS | Status: AC | PRN
  Administered 2024-09-30: 75 mL via INTRAVENOUS

## 2024-09-29 MED ORDER — BUPROPION HCL 75 MG PO TABS
75.0000 mg | ORAL_TABLET | Freq: Two times a day (BID) | ORAL | Status: DC
  Administered 2024-09-29 – 2024-10-02 (×7): 75 mg via ORAL
  Filled 2024-09-29 (×7): qty 1

## 2024-09-29 MED ORDER — DM-GUAIFENESIN ER 30-600 MG PO TB12: 1.0000 | ORAL_TABLET | Freq: Two times a day (BID) | ORAL | Status: DC | PRN

## 2024-09-29 MED ORDER — ENOXAPARIN SODIUM 40 MG/0.4ML IJ SOSY
40.0000 mg | PREFILLED_SYRINGE | INTRAMUSCULAR | Status: DC
  Administered 2024-09-29 – 2024-10-02 (×4): 40 mg via SUBCUTANEOUS
  Filled 2024-09-29 (×4): qty 0.4

## 2024-09-29 MED ORDER — ALBUTEROL SULFATE (2.5 MG/3ML) 0.083% IN NEBU: 2.5000 mg | INHALATION_SOLUTION | RESPIRATORY_TRACT | Status: DC | PRN

## 2024-09-29 MED ORDER — HYDRALAZINE HCL 20 MG/ML IJ SOLN: 5.0000 mg | INTRAMUSCULAR | Status: DC | PRN

## 2024-09-29 MED ORDER — IPRATROPIUM-ALBUTEROL 0.5-2.5 (3) MG/3ML IN SOLN: 3.0000 mL | RESPIRATORY_TRACT | Status: DC

## 2024-09-29 MED ORDER — METOPROLOL TARTRATE 25 MG PO TABS
12.5000 mg | ORAL_TABLET | Freq: Two times a day (BID) | ORAL | Status: DC
  Administered 2024-09-29 – 2024-10-02 (×7): 12.5 mg via ORAL
  Filled 2024-09-29 (×7): qty 1

## 2024-09-29 MED ORDER — ENSURE PLUS HIGH PROTEIN PO LIQD
237.0000 mL | Freq: Two times a day (BID) | ORAL | Status: DC
  Administered 2024-09-29 – 2024-10-02 (×6): 237 mL via ORAL

## 2024-09-29 MED ORDER — ORAL CARE MOUTH RINSE
15.0000 mL | OROMUCOSAL | Status: DC | PRN
Start: 2024-09-29 — End: 2024-10-02

## 2024-09-29 MED ORDER — DOXYCYCLINE HYCLATE 100 MG PO TABS
100.0000 mg | ORAL_TABLET | Freq: Two times a day (BID) | ORAL | Status: DC
  Administered 2024-09-29 – 2024-10-01 (×5): 100 mg via ORAL
  Filled 2024-09-29 (×6): qty 1

## 2024-09-29 MED ORDER — ORAL CARE MOUTH RINSE
15.0000 mL | OROMUCOSAL | Status: DC
  Administered 2024-09-30 – 2024-10-02 (×8): 15 mL via OROMUCOSAL

## 2024-09-29 MED ORDER — ONDANSETRON HCL 4 MG/2ML IJ SOLN: 4.0000 mg | Freq: Three times a day (TID) | INTRAMUSCULAR | Status: DC | PRN

## 2024-09-29 MED ORDER — METOPROLOL TARTRATE 25 MG PO TABS: 25.0000 mg | ORAL_TABLET | Freq: Two times a day (BID) | ORAL | Status: DC

## 2024-09-29 MED ORDER — FAMOTIDINE 20 MG PO TABS
40.0000 mg | ORAL_TABLET | Freq: Two times a day (BID) | ORAL | Status: DC
  Administered 2024-09-29 – 2024-10-02 (×7): 40 mg via ORAL
  Filled 2024-09-29 (×6): qty 2

## 2024-09-29 MED ORDER — IPRATROPIUM-ALBUTEROL 0.5-2.5 (3) MG/3ML IN SOLN
3.0000 mL | Freq: Four times a day (QID) | RESPIRATORY_TRACT | Status: DC
  Administered 2024-09-29 – 2024-10-01 (×8): 3 mL via RESPIRATORY_TRACT
  Filled 2024-09-29 (×8): qty 3

## 2024-09-29 MED ORDER — SUCRALFATE 1 G PO TABS
1.0000 g | ORAL_TABLET | Freq: Four times a day (QID) | ORAL | Status: DC
  Administered 2024-09-29 – 2024-10-02 (×13): 1 g via ORAL
  Filled 2024-09-29 (×14): qty 1

## 2024-09-29 MED ORDER — ALPRAZOLAM 0.25 MG PO TABS
0.2500 mg | ORAL_TABLET | Freq: Three times a day (TID) | ORAL | Status: DC | PRN
Start: 2024-09-29 — End: 2024-10-02

## 2024-09-29 MED ORDER — POTASSIUM CHLORIDE 10 MEQ/100ML IV SOLN
10.0000 meq | INTRAVENOUS | Status: AC
  Administered 2024-09-29 (×2): 10 meq via INTRAVENOUS
  Filled 2024-09-29: qty 100

## 2024-09-29 MED ORDER — ADULT MULTIVITAMIN W/MINERALS CH
1.0000 | ORAL_TABLET | Freq: Every day | ORAL | Status: DC
  Administered 2024-09-29 – 2024-10-02 (×4): 1 via ORAL
  Filled 2024-09-29 (×4): qty 1

## 2024-09-29 MED ORDER — METHYLPREDNISOLONE SODIUM SUCC 125 MG IJ SOLR
80.0000 mg | Freq: Every day | INTRAMUSCULAR | Status: DC
  Administered 2024-09-29 – 2024-10-01 (×3): 80 mg via INTRAVENOUS
  Filled 2024-09-29 (×3): qty 2

## 2024-09-29 MED ORDER — ALBUTEROL SULFATE (2.5 MG/3ML) 0.083% IN NEBU
2.5000 mg | INHALATION_SOLUTION | RESPIRATORY_TRACT | Status: DC | PRN
Start: 2024-09-29 — End: 2024-10-02
  Administered 2024-09-29: 2.5 mg via RESPIRATORY_TRACT
  Filled 2024-09-29 (×2): qty 3

## 2024-09-29 MED ORDER — GEMFIBROZIL 600 MG PO TABS
600.0000 mg | ORAL_TABLET | Freq: Two times a day (BID) | ORAL | Status: DC
  Administered 2024-09-29 – 2024-10-02 (×7): 600 mg via ORAL
  Filled 2024-09-29 (×10): qty 1

## 2024-09-29 MED ORDER — ACETAMINOPHEN 325 MG PO TABS
650.0000 mg | ORAL_TABLET | Freq: Four times a day (QID) | ORAL | Status: DC | PRN
Start: 2024-09-29 — End: 2024-10-02
  Administered 2024-10-02: 650 mg via ORAL
  Filled 2024-09-29: qty 2

## 2024-09-29 NOTE — H&P (Signed)
 History and Physical    Rachael Blankenship [name]:[id number] DOB: 28-Aug-1956 DOA: 09/28/2024  Referring MD/NP/PA:   PCP: Fernande Ophelia JINNY DOUGLAS, MD   Patient coming from:  The patient is coming from home.     Chief Complaint: SOB  HPI: Rachael Blankenship is a 68 y.o. female with medical history significant of COPD on 2L O2, former smoker, HTN, HLD, dCHF, pulmonary HTN, GERD, depression with anxiety, recent COVID infection in September, who presents with SOB.  Pt had COVID-19 infection in September 2025.  She continues to have worsening respiratory symptoms. Pt was seen by Dr. Theotis pulmonology who ordered CTA, which was negative for PE, but showed unchanged complete collapse of the right middle lobe on 10/13. Dr. Ronita of pulmonology performed flexible bronchoscopy with BAL, and found patient had complete collapse of right middle lobe with mucous collapse on 10/24.  The culture from BAL was negative.  Pt states that she continues to have SOB, which has been progressively worsening in the past several days.  Patient has a nonproductive cough.  No chest pain, fever or chills.  No nausea, vomiting, diarrhea or abdominal pain.  No symptoms of UTI. Patient uses 2 L oxygen  at baseline, but was found to have severe respiratory distress, could not speak in full sentence, oxygen  desaturation to 75%. EMS started nonrebreather, then changed to CPAP, still has severe respiratory distress.  BiPAP is started in ED.  Patient was given 125 mg of Solu-Medrol  and 2 g magnesium  sulfate by EMS.   CTA on 09/06/24 as outpt 1. No evidence of pulmonary embolism. 2. Unchanged Complete collapse of the right middle lobe. This is of uncertain etiology. Central obstructing lesion is not excluded. Recommend clinical correlation and follow up. 3. Stable atelectasis in the lingula. 4. Mild emphysema. 5. Aortic atherosclerosis.   Data reviewed independently and ED Course: pt was found to have WBC 14.8, negative PCR for COVID,  flu and RSV, potassium 2.9, magnesium  3.9, AKI, troponin 8.  Temperature normal, blood pressure 138/62, heart rate 94, RR 26.  VBG with pH 7.4, CO2 50.2, O2 38.  X-ray showed COPD with mild hyperinflation.  Patient is admitted to PCU as inpatient.   EKG: I have personally reviewed.  Sinus rhythm, QTc 481, PVC, with artificial effects   Review of Systems:   General: no fevers, chills, no body weight gain, fatigue HEENT: no blurry vision, hearing changes or sore throat Respiratory: Has dyspnea, coughing, wheezing CV: no chest pain, no palpitations GI: no nausea, vomiting, abdominal pain, diarrhea, constipation GU: no dysuria, burning on urination, increased urinary frequency, hematuria  Ext: no leg edema Neuro: no unilateral weakness, numbness, or tingling, no vision change or hearing loss Skin: no rash, no skin tear. MSK: No muscle spasm, no deformity, no limitation of range of movement in spin Heme: No easy bruising.  Travel history: No recent long distant travel.   Allergy:  Allergies  Allergen Reactions   Naproxen Itching   Sulfa Antibiotics Hives and Rash    Past Medical History:  Diagnosis Date   Bruises easily    Chronic respiratory failure with hypoxia (HCC)    Cigarette smoker    Collapse of right lung    COPD (chronic obstructive pulmonary disease) (HCC)    COVID-19 07/2024   Dependence on supplemental oxygen     Depression with anxiety    Endometriosis    GERD (gastroesophageal reflux disease)    Grade I diastolic dysfunction    History of sepsis  Hyperlipidemia    Hypertension 06/02/2012   Kidney stones    Pulmonary HTN (HCC)    Reactive airway disease 03/08/2014   Vitamin D  deficiency     Past Surgical History:  Procedure Laterality Date   BRONCHIAL WASHINGS N/A 09/17/2024   Procedure: IRRIGATION, BRONCHUS;  Surgeon: Parris Manna, MD;  Location: [facility] ORS;  Service: Thoracic;  Laterality: N/A;   CATARACT EXTRACTION W/PHACO Right 12/04/2023    Procedure: CATARACT EXTRACTION PHACO AND INTRAOCULAR LENS PLACEMENT (IOC) RIGHT  CLAREON VIVITY LENS;  Surgeon: Enola [name], MD;  Location: [facility];  Service: Ophthalmology;  Laterality: Right;  10.40 1:04.0   CATARACT EXTRACTION W/PHACO Left 12/18/2023   Procedure: CATARACT EXTRACTION PHACO AND INTRAOCULAR LENS PLACEMENT (IOC) LEFT  CLAREON VIVITY TORIC 16.28, 01:19.2;  Surgeon: Enola [name], MD;  Location: [facility];  Service: Ophthalmology;  Laterality: Left;   COLONOSCOPY WITH PROPOFOL  N/A 01/02/2023   Procedure: COLONOSCOPY WITH PROPOFOL ;  Surgeon: Unk Corinn Skiff, MD;  Location: [facility];  Service: Gastroenterology;  Laterality: N/A;   ESOPHAGOGASTRODUODENOSCOPY N/A 07/29/2018   Procedure: ESOPHAGOGASTRODUODENOSCOPY (EGD);  Surgeon: Toledo, Ladell POUR, MD;  Location: [facility] ENDOSCOPY;  Service: Gastroenterology;  Laterality: N/A;   ESOPHAGOGASTRODUODENOSCOPY (EGD) WITH PROPOFOL  N/A 01/02/2023   Procedure: ESOPHAGOGASTRODUODENOSCOPY (EGD) WITH PROPOFOL ;  Surgeon: Unk Corinn Skiff, MD;  Location: [facility] ENDOSCOPY;  Service: Gastroenterology;  Laterality: N/A;   FLEXIBLE BRONCHOSCOPY N/A 09/17/2024   Procedure: BRONCHOSCOPY, FLEXIBLE;  Surgeon: Parris Manna, MD;  Location: [facility]S;  Service: Thoracic;  Laterality: N/A;   KIDNEY STONE SURGERY     RIGHT HEART CATH Right 04/21/2024   Procedure: RIGHT HEART CATH;  Surgeon: Ammon Blunt, MD;  Location: [facility] INVASIVE CV LAB;  Service: Cardiovascular;  Laterality: Right;   TONSILLECTOMY      Social History:  reports that she quit smoking about 9 months ago. Her smoking use included cigarettes. She started smoking about 50 years ago. She has a 26.8 pack-year smoking history. She has never used smokeless tobacco. She reports current alcohol use. She reports that she does not use drugs.  Family History:  Family History  Problem Relation Age of Onset   Hypertension Mother    Hyperlipidemia Mother     GER disease Mother    Heart attack Father        23 yrs ago   Hypertension Father    Parkinson's disease Father    GER disease Father      Prior to Admission medications   Medication Sig Start Date End Date Taking? Authorizing Provider  ALPRAZolam  (XANAX ) 0.25 MG tablet Take 1 tablet (0.25 mg total) by mouth 3 (three) times daily as needed for anxiety. 12/11/23   Liana Fish, NP  ascorbic acid  (VITAMIN C ) 500 MG tablet Take 500 mg by mouth daily.    [provider]  aspirin  81 MG tablet Take 81 mg by mouth at bedtime.     [provider]  BREZTRI AEROSPHERE 160-9-4.8 MCG/ACT AERO inhaler Inhale 2 puffs into the lungs 2 (two) times daily. 04/12/24   [provider]  buPROPion  (WELLBUTRIN ) 75 MG tablet Take 75 mg by mouth 2 (two) times daily.    [provider]  Cholecalciferol  25 MCG (1000 UT) tablet Take 1,000 Units by mouth daily.     [provider]  Coenzyme Q10 (CO Q-10) 100 MG CAPS Take 100 mg by mouth daily.    [provider]  DENTA 5000 PLUS 1.1 % CREA dental cream Take 1  application  by mouth 2 (two) times daily as needed. 04/14/21   [provider]  DULoxetine  (CYMBALTA ) 60 MG capsule Take 1 capsule (60 mg total) by mouth daily. Patient taking differently: Take 60 mg by mouth every morning. 05/19/24   Abernathy, Alyssa, NP  Dupilumab 300 MG/2ML SOAJ Inject 300 mg into the skin every 14 (fourteen) days. 01/27/24   [provider]  EPINEPHrine  0.3 mg/0.3 mL IJ SOAJ injection Inject 0.3 mg into the muscle as needed. 01/27/24   [provider]  famotidine  (PEPCID ) 40 MG tablet Take 1 tablet (40 mg total) by mouth 2 (two) times daily. 04/05/24   Liana Fish, NP  fluticasone  (FLONASE ) 50 MCG/ACT nasal spray Place 2 sprays into both nostrils daily.    [provider]  furosemide  (LASIX ) 40 MG tablet TAKE 1 TABLET BY MOUTH DAILY Patient taking differently: Take 40 mg by mouth every morning.  04/12/24   Liana Fish, NP  gemfibrozil  (LOPID ) 600 MG tablet Take 1 tablet (600 mg total) by mouth 2 (two) times daily before a meal. 03/29/24   Abernathy, Alyssa, NP  HYDROcodone-acetaminophen  (NORCO/VICODIN) 5-325 MG tablet Take 1 tablet by mouth every 6 (six) hours as needed for moderate pain (pain score 4-6).    [provider]  ibuprofen (ADVIL) 800 MG tablet Take 800 mg by mouth every 6 (six) hours as needed for moderate pain (pain score 4-6) or mild pain (pain score 1-3).    [provider]  Ipratropium-Albuterol  (COMBIVENT  IN) Inhale 1-2 puffs into the lungs as needed.    [provider]  Ipratropium-Albuterol  (COMBIVENT  RESPIMAT) 20-100 MCG/ACT AERS respimat INHALE 1 PUFF INTO THE LUNGS EVERY 6 HOURS AS NEEDED FOR WHEEZING OR SHORTNESS OF BREATH Patient taking differently: Inhale 1 puff into the lungs 4 (four) times daily as needed. 10/31/23   Khan, Fozia M, MD  Magnesium  250 MG CAPS Take 250 mg by mouth daily.    [provider]  metoprolol  tartrate (LOPRESSOR ) 25 MG tablet Take 1 tablet (25 mg total) by mouth 2 (two) times daily. 12/26/23   Darci Pore, MD  Multiple Vitamins-Minerals (MULTIVITAMIN WITH MINERALS) tablet Take 1 tablet by mouth daily. Centrum Silver 50+    [provider]  OXYGEN  Inhale 2 L into the lungs continuous. Continuous oxygen  and NIV at night, 2 liters   pt uses [facility] for oxygen  supplies    [provider]  Potassium Chloride  CR (MICRO-K ) 8 MEQ CPCR capsule CR Take 1 capsule (8 mEq total) by mouth daily. Patient taking differently: Take 8 mEq by mouth every morning. 10/26/23   Khan, Fozia M, MD  sucralfate  (CARAFATE ) 1 g tablet Take 1 g by mouth 4 (four) times daily as needed (Stomach). Patient taking differently: Take 1 g by mouth 4 (four) times daily.    [provider]  traMADol (ULTRAM) 50 MG tablet Take 50 mg by mouth every 6 (six) hours as needed.    [provider]  vitamin E  180  MG (400 UNITS) capsule Take 400 Units by mouth daily.    [provider]    Physical Exam: Vitals:   09/28/24 2346 09/28/24 2350 09/29/24 0030  BP: 138/62  131/76  Pulse: 94 92 92  Resp: (!) 26 (!) 23 (!) 25  Temp: 97.7 F (36.5 C)    TempSrc: Axillary    SpO2: 100% 100% 100%   General: Has severe respiratory distress. HEENT:       Eyes: PERRL, EOMI, no jaundice  ENT: No discharge from the ears and nose, no pharynx injection, no tonsillar enlargement.        Neck: No JVD, no bruit, no mass felt. Heme: No neck lymph node enlargement. Cardiac: S1/S2, RRR, No murmurs, No gallops or rubs. Respiratory: Has wheezing bilaterally and decreased air movement bilaterally GI: Soft, nondistended, nontender, no rebound pain, no organomegaly, BS present. GU: No hematuria Ext: No pitting leg edema bilaterally. 1+DP/PT pulse bilaterally. Musculoskeletal: No joint deformities, No joint redness or warmth, no limitation of ROM in spin. Skin: No rashes.  Neuro: Alert, oriented X3, cranial nerves II-XII grossly intact, moves all extremities normally. Psych: Patient is not psychotic, no suicidal or hemocidal ideation.  Labs on Admission: I have personally reviewed following labs and imaging studies  CBC: Recent Labs  Lab 09/28/24 2347  WBC 14.8*  NEUTROABS 8.9*  HGB 12.1  HCT 38.2  MCV 87.0  PLT 463*   Basic Metabolic Panel: Recent Labs  Lab 09/28/24 2347  NA 140  K 2.9*  CL 91*  CO2 35*  GLUCOSE 188*  BUN 23  CREATININE 1.11*  CALCIUM 9.0  MG 3.9*  PHOS 4.2   GFR: Estimated Creatinine Clearance: 42.4 mL/min (A) (by C-G formula based on SCr of 1.11 mg/dL (H)). Liver Function Tests: No results for input(s): AST, ALT, ALKPHOS, BILITOT, PROT, ALBUMIN in the last 168 hours. No results for input(s): LIPASE, AMYLASE in the last 168 hours. No results for input(s): AMMONIA in the last 168 hours. Coagulation Profile: No results for input(s): INR,  PROTIME in the last 168 hours. Cardiac Enzymes: No results for input(s): CKTOTAL, CKMB, CKMBINDEX, TROPONINI in the last 168 hours. BNP (last 3 results) No results for input(s): PROBNP in the last 8760 hours. HbA1C: No results for input(s): HGBA1C in the last 72 hours. CBG: No results for input(s): GLUCAP in the last 168 hours. Lipid Profile: No results for input(s): CHOL, HDL, LDLCALC, TRIG, CHOLHDL, LDLDIRECT in the last 72 hours. Thyroid  Function Tests: No results for input(s): TSH, T4TOTAL, FREET4, T3FREE, THYROIDAB in the last 72 hours. Anemia Panel: No results for input(s): VITAMINB12, FOLATE, FERRITIN, TIBC, IRON, RETICCTPCT in the last 72 hours. Urine analysis:    Component Value Date/Time   COLORURINE YELLOW (A) 05/21/2018 1759   APPEARANCEUR Turbid (A) 09/13/2021 1708   LABSPEC 1.006 05/21/2018 1759   PHURINE 6.0 05/21/2018 1759   GLUCOSEU Negative 09/13/2021 1708   HGBUR NEGATIVE 05/21/2018 1759   BILIRUBINUR Negative 09/13/2021 1708   KETONESUR NEGATIVE 05/21/2018 1759   PROTEINUR Negative 09/13/2021 1708   PROTEINUR NEGATIVE 05/21/2018 1759   NITRITE Negative 09/13/2021 1708   NITRITE NEGATIVE 05/21/2018 1759   LEUKOCYTESUR 1+ (A) 09/13/2021 1708   Sepsis Labs: @LABRCNTIP (procalcitonin:4,lacticidven:4) ) Recent Results (from the past 240 hours)  Resp panel by RT-PCR (RSV, Flu A&B, Covid) Anterior Nasal Swab     Status: None   Collection Time: 09/28/24 11:52 PM   Specimen: Anterior Nasal Swab  Result Value Ref Range Status   SARS Coronavirus 2 by RT PCR NEGATIVE NEGATIVE Final    Comment: (NOTE) SARS-CoV-2 target nucleic acids are NOT DETECTED.  The SARS-CoV-2 RNA is generally detectable in upper respiratory specimens during the acute phase of infection. The lowest concentration of SARS-CoV-2 viral copies this assay can detect is 138 copies/mL. A negative result does not preclude SARS-Cov-2 infection and  should not be used as the sole basis for treatment or other patient management decisions. A negative result may occur with  improper specimen collection/handling,  submission of specimen other than nasopharyngeal swab, presence of viral mutation(s) within the areas targeted by this assay, and inadequate number of viral copies(<138 copies/mL). A negative result must be combined with clinical observations, patient history, and epidemiological information. The expected result is Negative.  Fact Sheet for Patients:  bloggercourse.com  Fact Sheet for Healthcare Providers:  seriousbroker.it  This test is no t yet approved or cleared by the [place]  FDA and  has been authorized for detection and/or diagnosis of SARS-CoV-2 by FDA under an Emergency Use Authorization (EUA). This EUA will remain  in effect (meaning this test can be used) for the duration of the COVID-19 declaration under Section 564(b)(1) of the Act, 21 U.S.C.section 360bbb-3(b)(1), unless the authorization is terminated  or revoked sooner.       Influenza A by PCR NEGATIVE NEGATIVE Final   Influenza B by PCR NEGATIVE NEGATIVE Final    Comment: (NOTE) The Xpert Xpress SARS-CoV-2/FLU/RSV plus assay is intended as an aid in the diagnosis of influenza from Nasopharyngeal swab specimens and should not be used as a sole basis for treatment. Nasal washings and aspirates are unacceptable for Xpert Xpress SARS-CoV-2/FLU/RSV testing.  Fact Sheet for Patients: bloggercourse.com  Fact Sheet for Healthcare Providers: seriousbroker.it  This test is not yet approved or cleared by the [place]  FDA and has been authorized for detection and/or diagnosis of SARS-CoV-2 by FDA under an Emergency Use Authorization (EUA). This EUA will remain in effect (meaning this test can be used) for the duration of the COVID-19 declaration  under Section 564(b)(1) of the Act, 21 U.S.C. section 360bbb-3(b)(1), unless the authorization is terminated or revoked.     Resp Syncytial Virus by PCR NEGATIVE NEGATIVE Final    Comment: (NOTE) Fact Sheet for Patients: bloggercourse.com  Fact Sheet for Healthcare Providers: seriousbroker.it  This test is not yet approved or cleared by the [place]  FDA and has been authorized for detection and/or diagnosis of SARS-CoV-2 by FDA under an Emergency Use Authorization (EUA). This EUA will remain in effect (meaning this test can be used) for the duration of the COVID-19 declaration under Section 564(b)(1) of the Act, 21 U.S.C. section 360bbb-3(b)(1), unless the authorization is terminated or revoked.  Performed at [facility], [address], [place], [place] [address]      Radiological Exams on Admission:   Assessment/Plan Principal Problem:   COPD exacerbation (HCC) Active Problems:   Acute on chronic respiratory failure with hypoxia and hypercapnia (HCC)   Pulmonary HTN (HCC)   Chronic diastolic CHF (congestive heart failure) (HCC)   Hypertension   Hyperlipemia   Hypokalemia   AKI (acute kidney injury)   Depression with anxiety   Assessment and Plan:  Acute on chronic respiratory failure with hypoxia and hypercapnia due to COPD exacerbation (HCC): Recent CTA negative for PE on 10/13, will not repeat.  At her baseline, patient uses 2 L oxygen , currently on BiPAP.  -will admit patient to PCU as inpt - try to wean off BiPAP -Bronchodilators and prn Mucinex -Solu-Medrol  80 mg IV daily after given 125 mg of Solu-Medrol  -Oral doxycycline  100 mg twice daily -Incentive spirometry -Follow up sputum culture  Pulmonary HTN (HCC) and chronic diastolic CHF (congestive heart failure) (HCC): 2D echo on 12/21/2023 showed EF of 60 to 65% with grade 1 diastolic dysfunction.  Patient does not have leg edema or JVD.  CHF  seems to be compensated. - Check BNP - Hold Lasix  due to AKI  Hypertension: -IV hydralazine  as needed -  Metoprolol   Hyperlipemia -Lopid   Hypokalemia: Potassium 2.9, magnesium  3.9. -Repleted potassium - Check phosphorus level  AKI (acute kidney injury): Baseline creatinine 0.43 on 09/13/2024.  Her creatinine is 1.11, BUN 23, GFR 53.  Likely due to dehydration and continuation of ibuprofen and Lasix . -Hold ibuprofen and Lasix   Depression with anxiety - Continue home medications: As needed Xanax , Cymbalta  and Wellbutrin      DVT ppx: SQ Lovenox   Code Status: Full code   Family Communication:  Yes, patient's husband at bed side.     Disposition Plan:  Anticipate discharge back to previous environment  Consults called:  none  Admission status and Level of care: Progressive: as inpt        Dispo: The patient is from: Home              Anticipated d/c is to: Home              Anticipated d/c date is: 2 days              Patient currently is not medically stable to d/c.    Severity of Illness:  The appropriate patient status for this patient is INPATIENT. Inpatient status is judged to be reasonable and necessary in order to provide the required intensity of service to ensure the patient's safety. The patient's presenting symptoms, physical exam findings, and initial radiographic and laboratory data in the context of their chronic comorbidities is felt to place them at high risk for further clinical deterioration. Furthermore, it is not anticipated that the patient will be medically stable for discharge from the hospital within 2 midnights of admission.   * I certify that at the point of admission it is my clinical judgment that the patient will require inpatient hospital care spanning beyond 2 midnights from the point of admission due to high intensity of service, high risk for further deterioration and high frequency of surveillance required.*       Date of Service  09/29/2024    Caleb Exon [facility]   If 7PM-7AM, please contact night-coverage [email or web address] 09/29/2024, 1:47 AM

## 2024-09-29 NOTE — ED Notes (Signed)
 Pt taken off Bipap and placed on 2l/min via [place] as directed by MD.

## 2024-09-29 NOTE — Plan of Care (Signed)

## 2024-09-29 NOTE — Progress Notes (Signed)
 PROGRESS NOTE    Rachael Blankenship  [name]:[id number] DOB: 08-27-1956 DOA: 09/28/2024 PCP: Fernande Ophelia JINNY DOUGLAS, MD  Outpatient Specialists: pulm    Brief Narrative:   From admission h and p  Rachael Blankenship is a 68 y.o. female with medical history significant of COPD on 2L O2, former smoker, HTN, HLD, dCHF, pulmonary HTN, GERD, depression with anxiety, recent COVID infection in September, who presents with SOB.   Pt had COVID-19 infection in September 2025.  She continues to have worsening respiratory symptoms. Pt was seen by Dr. Theotis pulmonology who ordered CTA, which was negative for PE, but showed unchanged complete collapse of the right middle lobe on 10/13. Dr. Ronita of pulmonology performed flexible bronchoscopy with BAL, and found patient had complete collapse of right middle lobe with mucous collapse on 10/24.  The culture from BAL was negative.   Pt states that she continues to have SOB, which has been progressively worsening in the past several days.  Patient has a nonproductive cough.  No chest pain, fever or chills.  No nausea, vomiting, diarrhea or abdominal pain.  No symptoms of UTI. Patient uses 2 L oxygen  at baseline, but was found to have severe respiratory distress, could not speak in full sentence, oxygen  desaturation to 75%. EMS started nonrebreather, then changed to CPAP, still has severe respiratory distress.  BiPAP is started in ED.  Patient was given 125 mg of Solu-Medrol  and 2 g magnesium  sulfate by EMS.  Assessment & Plan:   Principal Problem:   COPD exacerbation (HCC) Active Problems:   Acute on chronic respiratory failure with hypoxia and hypercapnia (HCC)   Pulmonary HTN (HCC)   Chronic diastolic CHF (congestive heart failure) (HCC)   Hypertension   Hyperlipemia   Hypokalemia   AKI (acute kidney injury)   Depression with anxiety   Cigarette smoker  # Acute on chronic hypoxic hypercarbic respiratory failure On 2 liters at home with bipap at bedtime.  Abrupt dyspnea and hypoxia evening of 11/4, much improved with bipap overnight. Trop and bnp negative. Treating for copd exacerbation as below. More mucous plugging may be an issue (had bronch 10/24, showed RML collapse 2/2 mucous plugging, culture negative). CXR nothing acute - dimer, CT vs CTA after that - trial off bipap today, will continue at bedtime  - sputum for culture if able to produce  # COPD with acute exacerbation - continue steroids, doxy, breathing treatments - on dupilumab and breztri at home  # AKI Resolved. May be 2/2 lasix , also regular nsaid - monitor  # Hypokalemia Resolved - monitor  # GAD - home xanax , duloxetine   # HFpEF Doesn't appear exacerbated, bnp wnl - holding home lasix  given aki and hypokalemia - will reduce dose of home metop from 25 to 12.5, given severe copd will need to review indications and consider weaning off completely  # ACP 10 min discussion today of goals of care, prognosis, etc. Patient is leaning toward dnr/dni - full code for now  DVT prophylaxis: lovenox  Code Status: full for now Family Communication: husband at bedside 11/5  Level of care: Progressive Status is: Inpatient Remains inpatient appropriate because: severity of illness    Consultants:  none  Procedures: none  Antimicrobials:  See above    Subjective: Reports improvement in dyspnea  Objective: Vitals:   09/29/24 0030 09/29/24 0430 09/29/24 0539 09/29/24 0615  BP: 131/76 (!) 153/90  133/69  Pulse: 92 81 79 77  Resp: (!) 25 20 (!) 21 19  Temp:      TempSrc:      SpO2: 100% 100% 100% 100%    Intake/Output Summary (Last 24 hours) at 09/29/2024 0851 Last data filed at 09/29/2024 0449 Gross per 24 hour  Intake 192.6 ml  Output --  Net 192.6 ml   There were no vitals filed for this visit.  Examination:  General exam: Appears calm and comfortable  Respiratory system: few faint rhonchi no wheeze Cardiovascular system: S1 & S2 heard, RRR.    Gastrointestinal system: Abdomen is nondistended, soft and nontender.  Central nervous system: Alert and oriented. No focal neurological deficits. Extremities: Symmetric 5 x 5 power. Skin: No rashes, lesions or ulcers Psychiatry: Judgement and insight appear normal. Mood & affect appropriate.     Data Reviewed: I have personally reviewed following labs and imaging studies  CBC: Recent Labs  Lab 09/28/24 2347 09/29/24 0610  WBC 14.8* 10.9*  NEUTROABS 8.9*  --   HGB 12.1 10.9*  HCT 38.2 35.2*  MCV 87.0 87.3  PLT 463* 388   Basic Metabolic Panel: Recent Labs  Lab 09/28/24 2347 09/29/24 0610  NA 140 139  K 2.9* 3.8  CL 91* 95*  CO2 35* 34*  GLUCOSE 188* 182*  BUN 23 21  CREATININE 1.11* 0.77  CALCIUM 9.0 8.7*  MG 3.9*  --   PHOS 4.2  --    GFR: Estimated Creatinine Clearance: 58.9 mL/min (by C-G formula based on SCr of 0.77 mg/dL). Liver Function Tests: No results for input(s): AST, ALT, ALKPHOS, BILITOT, PROT, ALBUMIN in the last 168 hours. No results for input(s): LIPASE, AMYLASE in the last 168 hours. No results for input(s): AMMONIA in the last 168 hours. Coagulation Profile: No results for input(s): INR, PROTIME in the last 168 hours. Cardiac Enzymes: No results for input(s): CKTOTAL, CKMB, CKMBINDEX, TROPONINI in the last 168 hours. BNP (last 3 results) No results for input(s): PROBNP in the last 8760 hours. HbA1C: No results for input(s): HGBA1C in the last 72 hours. CBG: No results for input(s): GLUCAP in the last 168 hours. Lipid Profile: No results for input(s): CHOL, HDL, LDLCALC, TRIG, CHOLHDL, LDLDIRECT in the last 72 hours. Thyroid  Function Tests: No results for input(s): TSH, T4TOTAL, FREET4, T3FREE, THYROIDAB in the last 72 hours. Anemia Panel: No results for input(s): VITAMINB12, FOLATE, FERRITIN, TIBC, IRON, RETICCTPCT in the last 72 hours. Urine analysis:    Component  Value Date/Time   COLORURINE YELLOW (A) 05/21/2018 1759   APPEARANCEUR Turbid (A) 09/13/2021 1708   LABSPEC 1.006 05/21/2018 1759   PHURINE 6.0 05/21/2018 1759   GLUCOSEU Negative 09/13/2021 1708   HGBUR NEGATIVE 05/21/2018 1759   BILIRUBINUR Negative 09/13/2021 1708   KETONESUR NEGATIVE 05/21/2018 1759   PROTEINUR Negative 09/13/2021 1708   PROTEINUR NEGATIVE 05/21/2018 1759   NITRITE Negative 09/13/2021 1708   NITRITE NEGATIVE 05/21/2018 1759   LEUKOCYTESUR 1+ (A) 09/13/2021 1708   Sepsis Labs: @LABRCNTIP (procalcitonin:4,lacticidven:4)  ) Recent Results (from the past 240 hours)  Resp panel by RT-PCR (RSV, Flu A&B, Covid) Anterior Nasal Swab     Status: None   Collection Time: 09/28/24 11:52 PM   Specimen: Anterior Nasal Swab  Result Value Ref Range Status   SARS Coronavirus 2 by RT PCR NEGATIVE NEGATIVE Final    Comment: (NOTE) SARS-CoV-2 target nucleic acids are NOT DETECTED.  The SARS-CoV-2 RNA is generally detectable in upper respiratory specimens during the acute phase of infection. The lowest concentration of SARS-CoV-2 viral copies this assay can detect is 138  copies/mL. A negative result does not preclude SARS-Cov-2 infection and should not be used as the sole basis for treatment or other patient management decisions. A negative result may occur with  improper specimen collection/handling, submission of specimen other than nasopharyngeal swab, presence of viral mutation(s) within the areas targeted by this assay, and inadequate number of viral copies(<138 copies/mL). A negative result must be combined with clinical observations, patient history, and epidemiological information. The expected result is Negative.  Fact Sheet for Patients:  bloggercourse.com  Fact Sheet for Healthcare Providers:  seriousbroker.it  This test is no t yet approved or cleared by the [place]  FDA and  has been authorized for  detection and/or diagnosis of SARS-CoV-2 by FDA under an Emergency Use Authorization (EUA). This EUA will remain  in effect (meaning this test can be used) for the duration of the COVID-19 declaration under Section 564(b)(1) of the Act, 21 U.S.C.section 360bbb-3(b)(1), unless the authorization is terminated  or revoked sooner.       Influenza A by PCR NEGATIVE NEGATIVE Final   Influenza B by PCR NEGATIVE NEGATIVE Final    Comment: (NOTE) The Xpert Xpress SARS-CoV-2/FLU/RSV plus assay is intended as an aid in the diagnosis of influenza from Nasopharyngeal swab specimens and should not be used as a sole basis for treatment. Nasal washings and aspirates are unacceptable for Xpert Xpress SARS-CoV-2/FLU/RSV testing.  Fact Sheet for Patients: bloggercourse.com  Fact Sheet for Healthcare Providers: seriousbroker.it  This test is not yet approved or cleared by the [place]  FDA and has been authorized for detection and/or diagnosis of SARS-CoV-2 by FDA under an Emergency Use Authorization (EUA). This EUA will remain in effect (meaning this test can be used) for the duration of the COVID-19 declaration under Section 564(b)(1) of the Act, 21 U.S.C. section 360bbb-3(b)(1), unless the authorization is terminated or revoked.     Resp Syncytial Virus by PCR NEGATIVE NEGATIVE Final    Comment: (NOTE) Fact Sheet for Patients: bloggercourse.com  Fact Sheet for Healthcare Providers: seriousbroker.it  This test is not yet approved or cleared by the [place]  FDA and has been authorized for detection and/or diagnosis of SARS-CoV-2 by FDA under an Emergency Use Authorization (EUA). This EUA will remain in effect (meaning this test can be used) for the duration of the COVID-19 declaration under Section 564(b)(1) of the Act, 21 U.S.C. section 360bbb-3(b)(1), unless the authorization is  terminated or revoked.  Performed at [facility], [address] [place], [place] [address]          Radiology Studies: DG Chest [place] 1 View Result Date: 09/29/2024 EXAM: 1 VIEW(S) XRAY OF THE CHEST 09/29/2024 12:11:26 AM COMPARISON: 09/17/2024 CLINICAL HISTORY: SHOB COPD SHOB COPD FINDINGS: LUNGS AND PLEURA: Mild hyperinflation. Minimal residual linear densities at the left base could reflect residual atelectasis or scarring. No focal pulmonary opacity. No pulmonary edema. No pleural effusion. No pneumothorax. HEART AND MEDIASTINUM: Aortic atherosclerosis. No acute abnormality of the cardiac silhouette. BONES AND SOFT TISSUES: No acute osseous abnormality. IMPRESSION: 1. Mild hyperinflation consistent with COPD. 2. Electronically signed by: Kevin Dover MD 09/29/2024 12:19 AM EST RP Workstation: [id number]        Scheduled Meds:  buPROPion   75 mg Oral BID   doxycycline   100 mg Oral Q12H   DULoxetine   60 mg Oral BH-q7a   enoxaparin  (LOVENOX ) injection  40 mg Subcutaneous Q24H   famotidine   40 mg Oral BID   gemfibrozil   600 mg Oral BID AC   ipratropium-albuterol   3 mL Nebulization Q4H   methylPREDNISolone  (SOLU-MEDROL ) injection  80 mg Intravenous Daily   metoprolol  tartrate  25 mg Oral BID   multivitamin with minerals  1 tablet Oral Daily   sucralfate   1 g Oral QID   Continuous Infusions:   LOS: 0 days     Devaughn KATHEE Ban, MD [facility]   If 7PM-7AM, please contact night-coverage [email or web address] Password [facility] 09/29/2024, 8:51 AM

## 2024-09-30 ENCOUNTER — Inpatient Hospital Stay

## 2024-09-30 ENCOUNTER — Encounter: Payer: Self-pay | Admitting: Internal Medicine

## 2024-09-30 DIAGNOSIS — J441 Chronic obstructive pulmonary disease with (acute) exacerbation: Secondary | ICD-10-CM | POA: Diagnosis not present

## 2024-09-30 LAB — MAGNESIUM: Magnesium: 2 mg/dL (ref 1.7–2.4)

## 2024-09-30 LAB — BASIC METABOLIC PANEL WITH GFR
Anion gap: 11 (ref 5–15)
BUN: 29 mg/dL — ABNORMAL HIGH (ref 8–23)
CO2: 33 mmol/L — ABNORMAL HIGH (ref 22–32)
Calcium: 9.1 mg/dL (ref 8.9–10.3)
Chloride: 94 mmol/L — ABNORMAL LOW (ref 98–111)
Creatinine, Ser: 0.78 mg/dL (ref 0.44–1.00)
GFR, Estimated: >60 mL/min (ref >60)
Glucose, Bld: 115 mg/dL — ABNORMAL HIGH (ref 70–99)
Potassium: 3.1 mmol/L — ABNORMAL LOW (ref 3.5–5.1)
Sodium: 138 mmol/L (ref 135–145)

## 2024-09-30 LAB — STREP PNEUMONIAE URINARY ANTIGEN: Strep Pneumo Urinary Antigen: NEGATIVE

## 2024-09-30 MED ORDER — SODIUM CHLORIDE 0.9 % IV SOLN
1.0000 g | INTRAVENOUS | Status: DC
  Administered 2024-09-30: 1 g via INTRAVENOUS
  Filled 2024-09-30 (×2): qty 10

## 2024-09-30 MED ORDER — POTASSIUM CHLORIDE 20 MEQ PO PACK
60.0000 meq | PACK | Freq: Once | ORAL | Status: AC
  Administered 2024-09-30: 60 meq via ORAL
  Filled 2024-09-30 (×2): qty 3

## 2024-09-30 NOTE — Consult Note (Signed)
 PULMONOLOGY         Date: 09/30/2024,   MRN# [id number] Rachael Blankenship Aug 13, 1956     AdmissionWeight: 64.3 kg                 CurrentWeight: 64.3 kg  Referring provider: Dr Kandis   CHIEF COMPLAINT:   Acute exacerbation of COPD   HISTORY OF PRESENT ILLNESS   This is a 68 year old female with a lifelong smoking history, COPD history of COVID-19 chronic hypoxemia requiring supplemental oxygen  recurrent mucous plugging status post bronchoscopy with therapeutic aspiration 1 month ago and continued chest discomfort.  She has been coughing heavy phlegm over the past year or so with seasonal changes and has improved after initiation of biologic Dupixent reporting resolution of phlegm upon expectoration.  Despite this she still continues to have acute exacerbations requiring IV steroids and antimicrobials.  She came in this time with complaints of acute onset of worsening shortness of breath and shallow breathing.  Her chest imaging does show atelectatic changes similar to prior.  She has had right middle lobe atelectasis multiple times and again has similar findings, additionally CT chest PE protocol on this admission negative for PE but does shows left upper lobe and right upper lobe airspace disease either inflammatory or infectious in etiology.  Incidental findings of worsening severe T6 compression fracture.  She is improved during my eval post IV steroids and reports partial relief of pain however not close to baseline as she was 6 months ago.    PAST MEDICAL HISTORY   Past Medical History:  Diagnosis Date   Bruises easily    Chronic respiratory failure with hypoxia (HCC)    Cigarette smoker    Collapse of right lung    COPD (chronic obstructive pulmonary disease) (HCC)    COVID-19 07/2024   Dependence on supplemental oxygen     Depression with anxiety    Endometriosis    GERD (gastroesophageal reflux disease)    Grade I diastolic dysfunction    History of sepsis     Hyperlipidemia    Hypertension 06/02/2012   Kidney stones    Pulmonary HTN (HCC)    Reactive airway disease 03/08/2014   Vitamin D  deficiency      SURGICAL HISTORY   Past Surgical History:  Procedure Laterality Date   BRONCHIAL WASHINGS N/A 09/17/2024   Procedure: IRRIGATION, BRONCHUS;  Surgeon: Parris Manna, MD;  Location: [facility];  Service: Thoracic;  Laterality: N/A;   CATARACT EXTRACTION W/PHACO Right 12/04/2023   Procedure: CATARACT EXTRACTION PHACO AND INTRAOCULAR LENS PLACEMENT (IOC) RIGHT  CLAREON VIVITY LENS;  Surgeon: Enola [name], MD;  Location: [facility];  Service: Ophthalmology;  Laterality: Right;  10.40 1:04.0   CATARACT EXTRACTION W/PHACO Left 12/18/2023   Procedure: CATARACT EXTRACTION PHACO AND INTRAOCULAR LENS PLACEMENT (IOC) LEFT  CLAREON VIVITY TORIC 16.28, 01:19.2;  Surgeon: Enola [name], MD;  Location: [facility];  Service: Ophthalmology;  Laterality: Left;   COLONOSCOPY WITH PROPOFOL  N/A 01/02/2023   Procedure: COLONOSCOPY WITH PROPOFOL ;  Surgeon: Unk Corinn Skiff, MD;  Location: [facility];  Service: Gastroenterology;  Laterality: N/A;   ESOPHAGOGASTRODUODENOSCOPY N/A 07/29/2018   Procedure: ESOPHAGOGASTRODUODENOSCOPY (EGD);  Surgeon: Toledo, Ladell MARLA, MD;  Location: [facility];  Service: Gastroenterology;  Laterality: N/A;   ESOPHAGOGASTRODUODENOSCOPY (EGD) WITH PROPOFOL  N/A 01/02/2023   Procedure: ESOPHAGOGASTRODUODENOSCOPY (EGD) WITH PROPOFOL ;  Surgeon: Unk Corinn Skiff, MD;  [facility];  Service: Gastroenterology;  Laterality: N/A;   FLEXIBLE BRONCHOSCOPY N/A  09/17/2024   Procedure: BRONCHOSCOPY, FLEXIBLE;  Surgeon: Parris Manna, MD;  Location: [facility];  Service: Thoracic;  Laterality: N/A;   KIDNEY STONE SURGERY     RIGHT HEART CATH Right 04/21/2024   Procedure: RIGHT HEART CATH;  Surgeon: Ammon Blunt, MD;  Location: [facility];  Service: Cardiovascular;  Laterality: Right;    TONSILLECTOMY       FAMILY HISTORY   Family History  Problem Relation Age of Onset   Hypertension Mother    Hyperlipidemia Mother    GER disease Mother    Heart attack Father        23 yrs ago   Hypertension Father    Parkinson's disease Father    GER disease Father      SOCIAL HISTORY   Social History   Tobacco Use   Smoking status: Former    Current packs/day: 0.00    Average packs/day: 0.5 packs/day for 50.1 years (26.8 ttl pk-yrs)    Types: Cigarettes    Start date: 89    Quit date: 12/18/2023    Years since quitting: 0.7   Smokeless tobacco: Never  Vaping Use   Vaping status: Former  Substance Use Topics   Alcohol use: Yes    Comment: ocassionally    Drug use: No     MEDICATIONS    Home Medication:    Current Medication:  Current Facility-Administered Medications:    acetaminophen  (TYLENOL ) tablet 650 mg, 650 mg, Oral, Q6H PRN, Niu, Xilin, MD   albuterol  (PROVENTIL ) (2.5 MG/3ML) 0.083% nebulizer solution 2.5 mg, 2.5 mg, Nebulization, Q2H PRN, Wouk, Devaughn Sayres, MD, 2.5 mg at 09/29/24 9090   ALPRAZolam  (XANAX ) tablet 0.25 mg, 0.25 mg, Oral, TID PRN, Niu, Xilin, MD   buPROPion  (WELLBUTRIN ) tablet 75 mg, 75 mg, Oral, BID, Niu, Xilin, MD, 75 mg at 09/30/24 0857   cefTRIAXone  (ROCEPHIN ) 1 g in sodium chloride  0.9 % 100 mL IVPB, 1 g, Intravenous, Q24H, Wouk, Devaughn Sayres, MD   dextromethorphan-guaiFENesin (MUCINEX DM) 30-600 MG per 12 hr tablet 1 tablet, 1 tablet, Oral, BID PRN, Niu, Xilin, MD   doxycycline  (VIBRA -TABS) tablet 100 mg, 100 mg, Oral, Q12H, Niu, Xilin, MD, 100 mg at 09/30/24 0016   DULoxetine  (CYMBALTA ) DR capsule 60 mg, 60 mg, Oral, Landrum Exon, Xilin, MD, 60 mg at 09/30/24 0857   enoxaparin  (LOVENOX ) injection 40 mg, 40 mg, Subcutaneous, Q24H, Niu, Xilin, MD, 40 mg at 09/30/24 0857   famotidine  (PEPCID ) tablet 40 mg, 40 mg, Oral, BID, Niu, Xilin, MD, 40 mg at 09/30/24 0857   feeding supplement (ENSURE PLUS HIGH PROTEIN) liquid 237 mL, 237  mL, Oral, BID BM, Niu, Xilin, MD, 237 mL at 09/29/24 1436   gemfibrozil  (LOPID ) tablet 600 mg, 600 mg, Oral, BID AC, Niu, Xilin, MD, 600 mg at 09/30/24 0857   hydrALAZINE  (APRESOLINE ) injection 5 mg, 5 mg, Intravenous, Q2H PRN, Niu, Xilin, MD   ipratropium-albuterol  (DUONEB) 0.5-2.5 (3) MG/3ML nebulizer solution 3 mL, 3 mL, Nebulization, Q6H, Wouk, Devaughn Sayres, MD, 3 mL at 09/30/24 1325   methylPREDNISolone  sodium succinate (SOLU-MEDROL ) 125 mg/2 mL injection 80 mg, 80 mg, Intravenous, Daily, Niu, Xilin, MD, 80 mg at 09/30/24 0857   metoprolol  tartrate (LOPRESSOR ) tablet 12.5 mg, 12.5 mg, Oral, BID, Wouk, Devaughn Sayres, MD, 12.5 mg at 09/30/24 0857   multivitamin with minerals tablet 1 tablet, 1 tablet, Oral, Daily, Niu, Xilin, MD, 1 tablet at 09/30/24 0857   ondansetron  (ZOFRAN ) injection 4 mg, 4 mg, Intravenous, Q8H PRN, Niu, Xilin,  MD   Oral care mouth rinse, 15 mL, Mouth Rinse, 4 times per day, Wouk, Devaughn Sayres, MD   Oral care mouth rinse, 15 mL, Mouth Rinse, PRN, Wouk, Devaughn Sayres, MD   potassium chloride  (KLOR-CON ) packet 60 mEq, 60 mEq, Oral, Once, Wouk, Devaughn Sayres, MD   sucralfate  (CARAFATE ) tablet 1 g, 1 g, Oral, QID, Niu, Xilin, MD, 1 g at 09/30/24 9142    ALLERGIES   Naproxen and Sulfa antibiotics     REVIEW OF SYSTEMS    Review of Systems:  Gen:  Denies  fever, sweats, chills weigh loss  HEENT: Denies blurred vision, double vision, ear pain, eye pain, hearing loss, nose bleeds, sore throat Cardiac:  No dizziness, chest pain or heaviness, chest tightness,edema Resp:   reports dyspnea chronically  Gi: Denies swallowing difficulty, stomach pain, nausea or vomiting, diarrhea, constipation, bowel incontinence Gu:  Denies bladder incontinence, burning urine Ext:   Denies Joint pain, stiffness or swelling Skin: Denies  skin rash, easy bruising or bleeding or hives Endoc:  Denies polyuria, polydipsia , polyphagia or weight change Psych:   Denies depression, insomnia or  hallucinations   Other:  All other systems negative   VS: BP (!) 153/75 (BP Location: Right Arm)   Pulse 98   Temp 97.9 F (36.6 C)   Resp 17   Ht 5' 3 (1.6 m)   Wt 64.3 kg   SpO2 92%   BMI 25.11 kg/m      PHYSICAL EXAM    GENERAL:NAD, no fevers, chills, no weakness no fatigue HEAD: Normocephalic, atraumatic.  EYES: Pupils equal, round, reactive to light. Extraocular muscles intact. No scleral icterus.  MOUTH: Moist mucosal membrane. Dentition intact. No abscess noted.  EAR, NOSE, THROAT: Clear without exudates. No external lesions.  NECK: Supple. No thyromegaly. No nodules. No JVD.  PULMONARY: decreased breath sounds with mild rhonchi worse at bases bilaterally.  CARDIOVASCULAR: S1 and S2. Regular rate and rhythm. No murmurs, rubs, or gallops. No edema. Pedal pulses 2+ bilaterally.  GASTROINTESTINAL: Soft, nontender, nondistended. No masses. Positive bowel sounds. No hepatosplenomegaly.  MUSCULOSKELETAL: No swelling, clubbing, or edema. Range of motion full in all extremities.  NEUROLOGIC: Cranial nerves II through XII are intact. No gross focal neurological deficits. Sensation intact. Reflexes intact.  SKIN: No ulceration, lesions, rashes, or cyanosis. Skin warm and dry. Turgor intact.  PSYCHIATRIC: Mood, affect within normal limits. The patient is awake, alert and oriented x 3. Insight, judgment intact.       IMAGING     Narrative & Impression CLINICAL DATA:  Elevated D-dimer with dyspnea. Evaluate for pulmonary embolism.   EXAM: CT ANGIOGRAPHY CHEST WITH CONTRAST   TECHNIQUE: Multidetector CT imaging of the chest was performed using the standard protocol during bolus administration of intravenous contrast. Multiplanar CT image reconstructions and MIPs were obtained to evaluate the vascular anatomy.   RADIATION DOSE REDUCTION: This exam was performed according to the departmental dose-optimization program which includes automated exposure control,  adjustment of the mA and/or kV according to patient size and/or use of iterative reconstruction technique.   CONTRAST:  75mL OMNIPAQUE  IOHEXOL  350 MG/ML SOLN   COMPARISON:  09/06/2024 and 11/29/2022   FINDINGS: Cardiovascular: Heart is normal size. There is calcified plaque over the left anterior descending coronary artery. Thoracic aorta is normal in caliber. Mild calcified plaque over the descending thoracic aorta. Pulmonary arterial system is well opacified without evidence of emboli. Remaining vascular structures are unremarkable.   Mediastinum/Nodes: No evidence of mediastinal or hilar  adenopathy. Remaining mediastinal structures are unremarkable.   Lungs/Pleura: Lungs are adequately inflated. There is chronic collapse/atelectasis over the central right middle lobe with interval development of 2 small cavitary like foci over the collapsed central right middle lobe measuring approximately 1.3 and 1.1 cm respectively. There is a patchy airspace process over the right upper lobe and minimally over the medial left upper lobe likely due to an infectious or inflammatory process. Stable scarring over the lingula. No effusion. Mild tracheobronchomalacia unchanged from the recent prior exam from October although worse compared to January 2024.   Upper Abdomen: Calcified plaque over the visualized upper abdomen. Remaining upper abdominal structures are unchanged without acute findings.   Musculoskeletal: Interval worsening of a severe T6 compression fracture with mild impression on the anterior thecal sac from the posterior border of the compression fracture. New anterior wedging of T5.   Review of the MIP images confirms the above findings.   IMPRESSION: 1. No evidence of pulmonary embolism. 2. Chronic collapse/atelectasis over the central right middle lobe with interval development of 2 small cavitary like foci over the collapsed central right middle lobe measuring  approximately 1.3 and 1.1 cm respectively. Findings may be due to an infectious or inflammatory process and less likely developing neoplastic process. This should be re-evaluated on the follow-up CT recommended below. 3. Patchy airspace process over the right upper lobe and minimally over the medial left upper lobe likely due to an infectious or inflammatory process. Recommend follow-up CT 6-8 weeks. 4. Mild tracheobronchomalacia unchanged from the recent prior exam from October, although worse compared to January 2024. 5. Interval worsening of a severe T6 compression fracture with new anterior wedging of T5. 6. Aortic atherosclerosis. Atherosclerotic coronary artery disease.   Aortic Atherosclerosis (ICD10-I70.0).     Electronically Signed   By: Toribio Agreste M.D.   On: 09/30/2024 10:59 ASSESSMENT/PLAN   Acute on chronic hypoxemic respiratory failure     Due to AECOPD with pneumonia     - new infiltrates with cavitary component bilaterally worse at RML     - possible pseudomonas will obtain cultures and empirically cover with Zosyn     - continue steroids for AECOPD    - Flutter valve for mucus plugging and IS for chest PT      Community acquired pneumonia      Dcd doxycycline  and rocephin  , started zosyn      Reducing steroids , currently from 80 to 40 bid and will taper to PO prednisone      -please hold her dose of dupixent which is due this week     -Viral Triplet and RVP is negative     -sputum cultures in process  Complete atelectasis of RML      - patient would benefit from chest PT, please do NOT use VEST therapy due to severe spinal fracture       - favor using metaneb q8h    Severe worsening of T6 compression fracture        - have reached out to NSGY for additional guidance         - patient has been complaining of pain for past 6 months        - we have treated her previosly with low dose narcotics but discontinued this due to dependence potential.  She has  been compliant but suffers with pain      Thank you for allowing me to participate in the care of this patient.   Patient/Family  are satisfied with care plan and all questions have been answered.    Provider disclosure: Patient with at least one acute or chronic illness or injury that poses a threat to life or bodily function and is being managed actively during this encounter.  All of the below services have been performed independently by signing provider:  review of prior documentation from internal and or external health records.  Review of previous and current lab results.  Interview and comprehensive assessment during patient visit today. Review of current and previous chest radiographs/CT scans. Discussion of management and test interpretation with health care team and patient/family.   This document was prepared using Dragon voice recognition software and may include unintentional dictation errors.     Susy Placzek, M.D.  Division of Pulmonary & Critical Care Medicine

## 2024-09-30 NOTE — Progress Notes (Signed)
 PROGRESS NOTE    Rachael Blankenship  [name]:[id number] DOB: Apr 28, 1956 DOA: 09/28/2024 PCP: Fernande Ophelia JINNY DOUGLAS, MD  Outpatient Specialists: pulm    Brief Narrative:   From admission h and p  Rachael Blankenship is a 68 y.o. female with medical history significant of COPD on 2L O2, former smoker, HTN, HLD, dCHF, pulmonary HTN, GERD, depression with anxiety, recent COVID infection in September, who presents with SOB.   Pt had COVID-19 infection in September 2025.  She continues to have worsening respiratory symptoms. Pt was seen by Dr. Theotis pulmonology who ordered CTA, which was negative for PE, but showed unchanged complete collapse of the right middle lobe on 10/13. Dr. Ronita of pulmonology performed flexible bronchoscopy with BAL, and found patient had complete collapse of right middle lobe with mucous collapse on 10/24.  The culture from BAL was negative.   Pt states that she continues to have SOB, which has been progressively worsening in the past several days.  Patient has a nonproductive cough.  No chest pain, fever or chills.  No nausea, vomiting, diarrhea or abdominal pain.  No symptoms of UTI. Patient uses 2 L oxygen  at baseline, but was found to have severe respiratory distress, could not speak in full sentence, oxygen  desaturation to 75%. EMS started nonrebreather, then changed to CPAP, still has severe respiratory distress.  BiPAP is started in ED.  Patient was given 125 mg of Solu-Medrol  and 2 g magnesium  sulfate by EMS.  Assessment & Plan:   Principal Problem:   COPD exacerbation (HCC) Active Problems:   Acute on chronic respiratory failure with hypoxia and hypercapnia (HCC)   Pulmonary HTN (HCC)   Chronic diastolic CHF (congestive heart failure) (HCC)   Hypertension   Hyperlipemia   Hypokalemia   AKI (acute kidney injury)   Depression with anxiety   Cigarette smoker  # Acute on chronic hypoxic hypercarbic respiratory failure # RML collapse # CAP? On 2 liters at home  with bipap at bedtime. Abrupt dyspnea and hypoxia evening of 11/4, much improved with bipap overnight. Trop and bnp negative. Treating for copd exacerbation as below. CT shows chronic collapse or RML with 2 cafitary foci also patchy airspace opacities right side. No PE. Respiratory panels negative - bipap prn - add ceftiraxone to doxy - sputum for culture if able to produce  # COPD with acute exacerbation - continue steroids, doxy/ceftriaxone , breathing treatments - on dupilumab and breztri at home  # AKI Resolved. May be 2/2 lasix , also regular nsaid - monitor  # Hypokalemia - treat  # GAD - home xanax , duloxetine   # HFpEF Doesn't appear exacerbated, bnp wnl - holding home lasix  given aki and hypokalemia - will reduce dose of home metop from 25 to 12.5, given severe copd will need to review indications and consider weaning off completely  # ACP 10 min discussion 11/5 of goals of care, prognosis, etc. Patient is leaning toward dnr/dni - full code for now  # Debility - PT consult  DVT prophylaxis: lovenox  Code Status: full  Family Communication: husband at bedside 11/6  Level of care: Med-Surg Status is: Inpatient Remains inpatient appropriate because: severity of illness    Consultants:  pulm  Procedures: none  Antimicrobials:  See above    Subjective: Reports ongoing dyspnea, cough not productive  Objective: Vitals:   09/30/24 0248 09/30/24 0355 09/30/24 0805 09/30/24 0832  BP:  (!) 142/79  (!) 153/75  Pulse:  77  98  Resp:  18  17  Temp:  98.4 F (36.9 C)  97.9 F (36.6 C)  TempSrc:      SpO2: 98% 100% 96% 92%  Weight:  64.3 kg    Height:        Intake/Output Summary (Last 24 hours) at 09/30/2024 1320 Last data filed at 09/30/2024 0900 Gross per 24 hour  Intake 240 ml  Output --  Net 240 ml   Filed Weights   09/30/24 0355  Weight: 64.3 kg    Examination:  General exam: Appears calm and comfortable  Respiratory system: few faint  rhonchi and exp wheeze, tachypneic Cardiovascular system: S1 & S2 heard, RRR.   Gastrointestinal system: Abdomen is nondistended, soft and nontender.  Central nervous system: Alert and oriented. No focal neurological deficits. Extremities: Symmetric 5 x 5 power. Skin: No rashes, lesions or ulcers Psychiatry: Judgement and insight appear normal. Mood & affect appropriate.     Data Reviewed: I have personally reviewed following labs and imaging studies  CBC: Recent Labs  Lab 09/28/24 2347 09/29/24 0610  WBC 14.8* 10.9*  NEUTROABS 8.9*  --   HGB 12.1 10.9*  HCT 38.2 35.2*  MCV 87.0 87.3  PLT 463* 388   Basic Metabolic Panel: Recent Labs  Lab 09/28/24 2347 09/29/24 0610 09/30/24 0552  NA 140 139 138  K 2.9* 3.8 3.1*  CL 91* 95* 94*  CO2 35* 34* 33*  GLUCOSE 188* 182* 115*  BUN 23 21 29*  CREATININE 1.11* 0.77 0.78  CALCIUM 9.0 8.7* 9.1  MG 3.9*  --  2.0  PHOS 4.2  --   --    GFR: Estimated Creatinine Clearance: 59.1 mL/min (by C-G formula based on SCr of 0.78 mg/dL). Liver Function Tests: No results for input(s): AST, ALT, ALKPHOS, BILITOT, PROT, ALBUMIN in the last 168 hours. No results for input(s): LIPASE, AMYLASE in the last 168 hours. No results for input(s): AMMONIA in the last 168 hours. Coagulation Profile: No results for input(s): INR, PROTIME in the last 168 hours. Cardiac Enzymes: No results for input(s): CKTOTAL, CKMB, CKMBINDEX, TROPONINI in the last 168 hours. BNP (last 3 results) No results for input(s): PROBNP in the last 8760 hours. HbA1C: No results for input(s): HGBA1C in the last 72 hours. CBG: No results for input(s): GLUCAP in the last 168 hours. Lipid Profile: No results for input(s): CHOL, HDL, LDLCALC, TRIG, CHOLHDL, LDLDIRECT in the last 72 hours. Thyroid  Function Tests: No results for input(s): TSH, T4TOTAL, FREET4, T3FREE, THYROIDAB in the last 72 hours. Anemia Panel: No  results for input(s): VITAMINB12, FOLATE, FERRITIN, TIBC, IRON, RETICCTPCT in the last 72 hours. Urine analysis:    Component Value Date/Time   COLORURINE YELLOW (A) 05/21/2018 1759   APPEARANCEUR Turbid (A) 09/13/2021 1708   LABSPEC 1.006 05/21/2018 1759   PHURINE 6.0 05/21/2018 1759   GLUCOSEU Negative 09/13/2021 1708   HGBUR NEGATIVE 05/21/2018 1759   BILIRUBINUR Negative 09/13/2021 1708   KETONESUR NEGATIVE 05/21/2018 1759   PROTEINUR Negative 09/13/2021 1708   PROTEINUR NEGATIVE 05/21/2018 1759   NITRITE Negative 09/13/2021 1708   NITRITE NEGATIVE 05/21/2018 1759   LEUKOCYTESUR 1+ (A) 09/13/2021 1708   Sepsis Labs: @LABRCNTIP (procalcitonin:4,lacticidven:4)  ) Recent Results (from the past 240 hours)  Resp panel by RT-PCR (RSV, Flu A&B, Covid) Anterior Nasal Swab     Status: None   Collection Time: 09/28/24 11:52 PM   Specimen: Anterior Nasal Swab  Result Value Ref Range Status   SARS Coronavirus 2 by RT PCR NEGATIVE NEGATIVE Final  Comment: (NOTE) SARS-CoV-2 target nucleic acids are NOT DETECTED.  The SARS-CoV-2 RNA is generally detectable in upper respiratory specimens during the acute phase of infection. The lowest concentration of SARS-CoV-2 viral copies this assay can detect is 138 copies/mL. A negative result does not preclude SARS-Cov-2 infection and should not be used as the sole basis for treatment or other patient management decisions. A negative result may occur with  improper specimen collection/handling, submission of specimen other than nasopharyngeal swab, presence of viral mutation(s) within the areas targeted by this assay, and inadequate number of viral copies(<138 copies/mL). A negative result must be combined with clinical observations, patient history, and epidemiological information. The expected result is Negative.  Fact Sheet for Patients:  bloggercourse.com  Fact Sheet for Healthcare Providers:   seriousbroker.it  This test is no t yet approved or cleared by the [place]  FDA and  has been authorized for detection and/or diagnosis of SARS-CoV-2 by FDA under an Emergency Use Authorization (EUA). This EUA will remain  in effect (meaning this test can be used) for the duration of the COVID-19 declaration under Section 564(b)(1) of the Act, 21 U.S.C.section 360bbb-3(b)(1), unless the authorization is terminated  or revoked sooner.       Influenza A by PCR NEGATIVE NEGATIVE Final   Influenza B by PCR NEGATIVE NEGATIVE Final    Comment: (NOTE) The Xpert Xpress SARS-CoV-2/FLU/RSV plus assay is intended as an aid in the diagnosis of influenza from Nasopharyngeal swab specimens and should not be used as a sole basis for treatment. Nasal washings and aspirates are unacceptable for Xpert Xpress SARS-CoV-2/FLU/RSV testing.  Fact Sheet for Patients: bloggercourse.com  Fact Sheet for Healthcare Providers: seriousbroker.it  This test is not yet approved or cleared by the [place]  FDA and has been authorized for detection and/or diagnosis of SARS-CoV-2 by FDA under an Emergency Use Authorization (EUA). This EUA will remain in effect (meaning this test can be used) for the duration of the COVID-19 declaration under Section 564(b)(1) of the Act, 21 U.S.C. section 360bbb-3(b)(1), unless the authorization is terminated or revoked.     Resp Syncytial Virus by PCR NEGATIVE NEGATIVE Final    Comment: (NOTE) Fact Sheet for Patients: bloggercourse.com  Fact Sheet for Healthcare Providers: seriousbroker.it  This test is not yet approved or cleared by the [place]  FDA and has been authorized for detection and/or diagnosis of SARS-CoV-2 by FDA under an Emergency Use Authorization (EUA). This EUA will remain in effect (meaning this test can be used) for  the duration of the COVID-19 declaration under Section 564(b)(1) of the Act, 21 U.S.C. section 360bbb-3(b)(1), unless the authorization is terminated or revoked.  Performed at [facility], [address] [place], [place] [address]   Respiratory (~20 pathogens) panel by PCR     Status: None   Collection Time: 09/29/24  9:31 AM   Specimen: Nasopharyngeal Swab; Respiratory  Result Value Ref Range Status   Adenovirus NOT DETECTED NOT DETECTED Final   Coronavirus 229E NOT DETECTED NOT DETECTED Final    Comment: (NOTE) The Coronavirus on the Respiratory Panel, DOES NOT test for the novel  Coronavirus (2019 nCoV)    Coronavirus HKU1 NOT DETECTED NOT DETECTED Final   Coronavirus NL63 NOT DETECTED NOT DETECTED Final   Coronavirus OC43 NOT DETECTED NOT DETECTED Final   Metapneumovirus NOT DETECTED NOT DETECTED Final   Rhinovirus / Enterovirus NOT DETECTED NOT DETECTED Final   Influenza A NOT DETECTED NOT DETECTED Final   Influenza B NOT DETECTED  NOT DETECTED Final   Parainfluenza Virus 1 NOT DETECTED NOT DETECTED Final   Parainfluenza Virus 2 NOT DETECTED NOT DETECTED Final   Parainfluenza Virus 3 NOT DETECTED NOT DETECTED Final   Parainfluenza Virus 4 NOT DETECTED NOT DETECTED Final   Respiratory Syncytial Virus NOT DETECTED NOT DETECTED Final   Bordetella pertussis NOT DETECTED NOT DETECTED Final   Bordetella Parapertussis NOT DETECTED NOT DETECTED Final   Chlamydophila pneumoniae NOT DETECTED NOT DETECTED Final   Mycoplasma pneumoniae NOT DETECTED NOT DETECTED Final    Comment: Performed at [facility], [address] [place], [place] [address]         Radiology Studies: CT Angio Chest Pulmonary Embolism (PE) W or WO Contrast Result Date: 09/30/2024 CLINICAL DATA:  Elevated D-dimer with dyspnea. Evaluate for pulmonary embolism. EXAM: CT ANGIOGRAPHY CHEST WITH CONTRAST TECHNIQUE: Multidetector CT imaging of the chest was performed using the standard protocol  during bolus administration of intravenous contrast. Multiplanar CT image reconstructions and MIPs were obtained to evaluate the vascular anatomy. RADIATION DOSE REDUCTION: This exam was performed according to the departmental dose-optimization program which includes automated exposure control, adjustment of the mA and/or kV according to patient size and/or use of iterative reconstruction technique. CONTRAST:  75mL OMNIPAQUE  IOHEXOL  350 MG/ML SOLN COMPARISON:  09/06/2024 and 11/29/2022 FINDINGS: Cardiovascular: Heart is normal size. There is calcified plaque over the left anterior descending coronary artery. Thoracic aorta is normal in caliber. Mild calcified plaque over the descending thoracic aorta. Pulmonary arterial system is well opacified without evidence of emboli. Remaining vascular structures are unremarkable. Mediastinum/Nodes: No evidence of mediastinal or hilar adenopathy. Remaining mediastinal structures are unremarkable. Lungs/Pleura: Lungs are adequately inflated. There is chronic collapse/atelectasis over the central right middle lobe with interval development of 2 small cavitary like foci over the collapsed central right middle lobe measuring approximately 1.3 and 1.1 cm respectively. There is a patchy airspace process over the right upper lobe and minimally over the medial left upper lobe likely due to an infectious or inflammatory process. Stable scarring over the lingula. No effusion. Mild tracheobronchomalacia unchanged from the recent prior exam from October although worse compared to January 2024. Upper Abdomen: Calcified plaque over the visualized upper abdomen. Remaining upper abdominal structures are unchanged without acute findings. Musculoskeletal: Interval worsening of a severe T6 compression fracture with mild impression on the anterior thecal sac from the posterior border of the compression fracture. New anterior wedging of T5. Review of the MIP images confirms the above findings.  IMPRESSION: 1. No evidence of pulmonary embolism. 2. Chronic collapse/atelectasis over the central right middle lobe with interval development of 2 small cavitary like foci over the collapsed central right middle lobe measuring approximately 1.3 and 1.1 cm respectively. Findings may be due to an infectious or inflammatory process and less likely developing neoplastic process. This should be re-evaluated on the follow-up CT recommended below. 3. Patchy airspace process over the right upper lobe and minimally over the medial left upper lobe likely due to an infectious or inflammatory process. Recommend follow-up CT 6-8 weeks. 4. Mild tracheobronchomalacia unchanged from the recent prior exam from October, although worse compared to January 2024. 5. Interval worsening of a severe T6 compression fracture with new anterior wedging of T5. 6. Aortic atherosclerosis. Atherosclerotic coronary artery disease. Aortic Atherosclerosis (ICD10-I70.0). Electronically Signed   By: Toribio Agreste M.D.   On: 09/30/2024 10:59   DG Chest Port 1 View Result Date: 09/29/2024 EXAM: 1 VIEW(S) XRAY OF THE CHEST 09/29/2024  12:11:26 AM COMPARISON: 09/17/2024 CLINICAL HISTORY: SHOB COPD SHOB COPD FINDINGS: LUNGS AND PLEURA: Mild hyperinflation. Minimal residual linear densities at the left base could reflect residual atelectasis or scarring. No focal pulmonary opacity. No pulmonary edema. No pleural effusion. No pneumothorax. HEART AND MEDIASTINUM: Aortic atherosclerosis. No acute abnormality of the cardiac silhouette. BONES AND SOFT TISSUES: No acute osseous abnormality. IMPRESSION: 1. Mild hyperinflation consistent with COPD. 2. Electronically signed by: Kevin Dover MD 09/29/2024 12:19 AM EST RP Workstation: [id number]        Scheduled Meds:  buPROPion   75 mg Oral BID   doxycycline   100 mg Oral Q12H   DULoxetine   60 mg Oral BH-q7a   enoxaparin  (LOVENOX ) injection  40 mg Subcutaneous Q24H   famotidine   40 mg Oral BID   feeding  supplement  237 mL Oral BID BM   gemfibrozil   600 mg Oral BID AC   ipratropium-albuterol   3 mL Nebulization Q6H   methylPREDNISolone  (SOLU-MEDROL ) injection  80 mg Intravenous Daily   metoprolol  tartrate  12.5 mg Oral BID   multivitamin with minerals  1 tablet Oral Daily   mouth rinse  15 mL Mouth Rinse 4 times per day   potassium chloride   60 mEq Oral Once   sucralfate   1 g Oral QID   Continuous Infusions:   LOS: 1 day     Devaughn KATHEE Ban, MD [facility]   If 7PM-7AM, please contact night-coverage [email or web address] Password [email or web address] 09/30/2024, 1:20 PM

## 2024-09-30 NOTE — Plan of Care (Signed)
  Problem: Education: Goal: Knowledge of General Education information will improve Description: Including pain rating scale, medication(s)/side effects and non-pharmacologic comfort measures Outcome: Progressing   Problem: Activity: Goal: Risk for activity intolerance will decrease Outcome: Progressing   Problem: Safety: Goal: Ability to remain free from injury will improve Outcome: Progressing   Problem: Skin Integrity: Goal: Risk for impaired skin integrity will decrease Outcome: Progressing   Problem: Activity: Goal: Ability to tolerate increased activity will improve Outcome: Progressing   Problem: Respiratory: Goal: Ability to maintain a clear airway will improve Outcome: Progressing

## 2024-10-01 ENCOUNTER — Other Ambulatory Visit: Payer: Self-pay

## 2024-10-01 ENCOUNTER — Inpatient Hospital Stay

## 2024-10-01 DIAGNOSIS — M4804 Spinal stenosis, thoracic region: Secondary | ICD-10-CM | POA: Diagnosis not present

## 2024-10-01 DIAGNOSIS — R609 Edema, unspecified: Secondary | ICD-10-CM | POA: Diagnosis not present

## 2024-10-01 DIAGNOSIS — J441 Chronic obstructive pulmonary disease with (acute) exacerbation: Secondary | ICD-10-CM | POA: Diagnosis not present

## 2024-10-01 DIAGNOSIS — M4854XA Collapsed vertebra, not elsewhere classified, thoracic region, initial encounter for fracture: Secondary | ICD-10-CM | POA: Diagnosis not present

## 2024-10-01 DIAGNOSIS — R29890 Loss of height: Secondary | ICD-10-CM | POA: Diagnosis not present

## 2024-10-01 DIAGNOSIS — S22050A Wedge compression fracture of T5-T6 vertebra, initial encounter for closed fracture: Secondary | ICD-10-CM

## 2024-10-01 LAB — BASIC METABOLIC PANEL WITH GFR
Anion gap: 8 (ref 5–15)
BUN: 27 mg/dL — ABNORMAL HIGH (ref 8–23)
CO2: 32 mmol/L (ref 22–32)
Calcium: 8.9 mg/dL (ref 8.9–10.3)
Chloride: 101 mmol/L (ref 98–111)
Creatinine, Ser: 0.68 mg/dL (ref 0.44–1.00)
GFR, Estimated: >60 mL/min (ref >60)
Glucose, Bld: 104 mg/dL — ABNORMAL HIGH (ref 70–99)
Potassium: 3.7 mmol/L (ref 3.5–5.1)
Sodium: 141 mmol/L (ref 135–145)

## 2024-10-01 MED ORDER — METHYLPREDNISOLONE SODIUM SUCC 40 MG IJ SOLR
40.0000 mg | Freq: Two times a day (BID) | INTRAMUSCULAR | Status: DC
  Administered 2024-10-01 – 2024-10-02 (×2): 40 mg via INTRAVENOUS
  Filled 2024-10-01 (×2): qty 1

## 2024-10-01 MED ORDER — PIPERACILLIN-TAZOBACTAM 3.375 G IVPB
3.3750 g | Freq: Three times a day (TID) | INTRAVENOUS | Status: DC
  Administered 2024-10-01 – 2024-10-02 (×4): 3.375 g via INTRAVENOUS
  Filled 2024-10-01 (×4): qty 50

## 2024-10-01 MED ORDER — IPRATROPIUM-ALBUTEROL 0.5-2.5 (3) MG/3ML IN SOLN
3.0000 mL | Freq: Three times a day (TID) | RESPIRATORY_TRACT | Status: DC
  Administered 2024-10-01 – 2024-10-02 (×4): 3 mL via RESPIRATORY_TRACT
  Filled 2024-10-01 (×4): qty 3

## 2024-10-01 MED ORDER — LEVOFLOXACIN 750 MG PO TABS
750.0000 mg | ORAL_TABLET | Freq: Every day | ORAL | Status: DC
  Administered 2024-10-02: 750 mg via ORAL
  Filled 2024-10-01: qty 1

## 2024-10-01 NOTE — Plan of Care (Signed)

## 2024-10-01 NOTE — Evaluation (Signed)
 Physical Therapy Evaluation Patient Details Name: Rachael Blankenship MRN: [id number] DOB: 12/31/1955 Today's Date: 10/01/2024  History of Present Illness  Rachael Blankenship is a 68yoF who comes to [facility] with continued severe SOB ongoing since she had COVID PNA in September. PMH:  COPD on 2L O2, former smoker, HTN, HLD, dCHF, pulmonary HTN, GERD, depression with anxiety. 09/17/24 bronchoscopy OP pulmonology for therapeutic aspiration of tracheobronchial tree and bronchial alveolar lavage due to ongoing COVID symptoms.   Clinical Impression  Pt in bed on arrival, has been working on breakfast, has a fair amount of resting dyspnea this morning, some low level chest pain as well. Pt anxious but agreeable to try out some basic mobility to ascertain symptoms tolerance. Pt moving with a good level of safety and independence, even tolerates 5 minutes standing, but RR remains elevated, as does dyspnea- pt ultimately doesn't feel able to attempt walking right now. Pt fairly fatigued with slight elevation in chest pain by end of session. Pt remains on 2.5L O2 and sats 93% or higher. Pt already has O2 at home. Pt has a rollator as well a WC that used to belong to her mother, both of which are reported to be in working order. Placed OT consult for next day. Will continued to follow.        If plan is discharge home, recommend the following: A lot of help with walking and/or transfers;A lot of help with bathing/dressing/bathroom;Assist for transportation;Assistance with cooking/housework;Help with stairs or ramp for entrance   Can travel by private vehicle        Equipment Recommendations None recommended by PT  Recommendations for Other Services       Functional Status Assessment Patient has had a recent decline in their functional status and demonstrates the ability to make significant improvements in function in a reasonable and predictable amount of time.     Precautions / Restrictions  Precautions Precautions: Fall Restrictions Weight Bearing Restrictions Per Provider Order: No      Mobility  Bed Mobility Overal bed mobility: Modified Independent                  Transfers Overall transfer level: Modified independent                      Ambulation/Gait Ambulation/Gait assistance:  (takes a few steps over to counter, but is unable to tolerate attempting any appreciable AMB.)                Stairs            Wheelchair Mobility     Tilt Bed    Modified Rankin (Stroke Patients Only)       Balance                                             Pertinent Vitals/Pain Pain Assessment Pain Assessment: Faces Faces Pain Scale: Hurts little more Pain Location: chest pain Pain Intervention(s): Limited activity within patient's tolerance, Monitored during session, Premedicated before session    Home Living Family/patient expects to be discharged to:: Private residence Living Arrangements: Spouse/significant other Available Help at Discharge: Family Type of Home: House Home Access: Stairs to enter Entrance Stairs-Rails: Lawyer of Steps: 5   Home Layout: Two level;Laundry or work area in basement;Able to live on main level with bedroom/bathroom Home Equipment:  Cane - single point;Shower seat (has moms WC as needed) Additional Comments: 2 steps to sunken den    Prior [employer]                       Extremity/Trunk Assessment                Communication        Cognition Arousal: Alert Behavior During Therapy: WFL for tasks assessed/performed   PT - Cognitive impairments: No apparent impairments                                 Cueing       General Comments      Exercises Other Exercises Other Exercises: standing at counter x5 minutes Other Exercises: seated at counter periface care x1 minute   Assessment/Plan    PT Assessment Patient  needs continued PT services  PT Problem List Cardiopulmonary status limiting activity;Decreased activity tolerance       PT Treatment Interventions DME instruction;Gait training;Functional mobility training;Therapeutic activities;Therapeutic exercise;Balance training    PT Goals (Current goals can be found in the Care Plan section)  Acute Rehab PT Goals Patient Stated Goal: some control over SOB PT Goal Formulation: With patient Time For Goal Achievement: 10/15/24 Potential to Achieve Goals: Good    Frequency Min 2X/week     Co-evaluation               AM-PAC PT 6 Clicks Mobility  Outcome Measure Help needed turning from your back to your side while in a flat bed without using bedrails?: None Help needed moving from lying on your back to sitting on the side of a flat bed without using bedrails?: None Help needed moving to and from a bed to a chair (including a wheelchair)?: A Little Help needed standing up from a chair using your arms (e.g., wheelchair or bedside chair)?: A Little Help needed to walk in hospital room?: A Lot Help needed climbing 3-5 steps with a railing? : A Lot 6 Click Score: 18    End of Session   Activity Tolerance: Patient limited by fatigue;Patient limited by pain Patient left: in bed;with call bell/phone within reach   PT Visit Diagnosis: Other abnormalities of gait and mobility (R26.89);Difficulty in walking, not elsewhere classified (R26.2)    Time: 9077-9053 PT Time Calculation (min) (ACUTE ONLY): 24 min   Charges:   PT Evaluation $PT Eval Moderate Complexity: 1 Mod PT Treatments $Therapeutic Activity: 8-22 mins PT General Charges $$ ACUTE PT VISIT: 1 Visit    11:57 AM, 10/01/24 Peggye JAYSON Linear, PT, DPT Physical Therapist - [facility]  [phone] (ASCOM)    Mehgan Santmyer C 10/01/2024, 11:40 AM

## 2024-10-01 NOTE — Progress Notes (Signed)
 PULMONOLOGY         Date: 10/01/2024,   MRN# [id number] PALYN SCRIMA 11/16/1956     AdmissionWeight: 64.3 kg                 CurrentWeight: 65.6 kg  Referring provider: Dr Kandis   CHIEF COMPLAINT:   Acute exacerbation of COPD   HISTORY OF PRESENT ILLNESS   This is a 68 year old female with a lifelong smoking history, COPD history of COVID-19 chronic hypoxemia requiring supplemental oxygen  recurrent mucous plugging status post bronchoscopy with therapeutic aspiration 1 month ago and continued chest discomfort.  She has been coughing heavy phlegm over the past year or so with seasonal changes and has improved after initiation of biologic Dupixent reporting resolution of phlegm upon expectoration.  Despite this she still continues to have acute exacerbations requiring IV steroids and antimicrobials.  She came in this time with complaints of acute onset of worsening shortness of breath and shallow breathing.  Her chest imaging does show atelectatic changes similar to prior.  She has had right middle lobe atelectasis multiple times and again has similar findings, additionally CT chest PE protocol on this admission negative for PE but does shows left upper lobe and right upper lobe airspace disease either inflammatory or infectious in etiology.  Incidental findings of worsening severe T6 compression fracture.  She is improved during my eval post IV steroids and reports partial relief of pain however not close to baseline as she was 6 months ago.   10/01/24-patient seen at bedside with husband present. She's able to speak in full sentences on 2-3L/min [place].  Her breathing is improved getting closer to baseline. There is a new problem which is worse and is known to hinder breathing which is a severe T6 fracture and associated kyphosis. This leads to restricted ventilatory defect.   PAST MEDICAL HISTORY   Past Medical History:  Diagnosis Date   Bruises easily    Chronic respiratory  failure with hypoxia (HCC)    Cigarette smoker    Collapse of right lung    COPD (chronic obstructive pulmonary disease) (HCC)    COVID-19 07/2024   Dependence on supplemental oxygen     Depression with anxiety    Endometriosis    GERD (gastroesophageal reflux disease)    Grade I diastolic dysfunction    History of sepsis    Hyperlipidemia    Hypertension 06/02/2012   Kidney stones    Pulmonary HTN (HCC)    Reactive airway disease 03/08/2014   Vitamin D  deficiency      SURGICAL HISTORY   Past Surgical History:  Procedure Laterality Date   BRONCHIAL WASHINGS N/A 09/17/2024   Procedure: IRRIGATION, BRONCHUS;  Surgeon: Parris Manna, MD;  Location: [facility];  Service: Thoracic;  Laterality: N/A;   CATARACT EXTRACTION W/PHACO Right 12/04/2023   Procedure: CATARACT EXTRACTION PHACO AND INTRAOCULAR LENS PLACEMENT (IOC) RIGHT  CLAREON VIVITY LENS;  Surgeon: Enola [name], MD;  Location: [facility];  Service: Ophthalmology;  Laterality: Right;  10.40 1:04.0   CATARACT EXTRACTION W/PHACO Left 12/18/2023   Procedure: CATARACT EXTRACTION PHACO AND INTRAOCULAR LENS PLACEMENT (IOC) LEFT  CLAREON VIVITY TORIC 16.28, 01:19.2;  Surgeon: Enola [name], MD;  Location: [facility];  Service: Ophthalmology;  Laterality: Left;   COLONOSCOPY WITH PROPOFOL  N/A 01/02/2023   Procedure: COLONOSCOPY WITH PROPOFOL ;  Surgeon: Unk Corinn Skiff, MD;  Location: [facility];  Service: Gastroenterology;  Laterality: N/A;   ESOPHAGOGASTRODUODENOSCOPY N/A 07/29/2018  Procedure: ESOPHAGOGASTRODUODENOSCOPY (EGD);  Surgeon: Toledo, Ladell POUR, MD;  Location: [facility];  Service: Gastroenterology;  Laterality: N/A;   ESOPHAGOGASTRODUODENOSCOPY (EGD) WITH PROPOFOL  N/A 01/02/2023   Procedure: ESOPHAGOGASTRODUODENOSCOPY (EGD) WITH PROPOFOL ;  Surgeon: Unk Corinn Skiff, MD;  Location: [facility];  Service: Gastroenterology;  Laterality: N/A;   FLEXIBLE BRONCHOSCOPY N/A 09/17/2024    Procedure: BRONCHOSCOPY, FLEXIBLE;  Surgeon: Parris Manna, MD;  Location: [facility];  Service: Thoracic;  Laterality: N/A;   KIDNEY STONE SURGERY     RIGHT HEART CATH Right 04/21/2024   Procedure: RIGHT HEART CATH;  Surgeon: Ammon Blunt, MD;  Location: [facility];  Service: Cardiovascular;  Laterality: Right;   TONSILLECTOMY       FAMILY HISTORY   Family History  Problem Relation Age of Onset   Hypertension Mother    Hyperlipidemia Mother    GER disease Mother    Heart attack Father        23 yrs ago   Hypertension Father    Parkinson's disease Father    GER disease Father      SOCIAL HISTORY   Social History   Tobacco Use   Smoking status: Former    Current packs/day: 0.00    Average packs/day: 0.5 packs/day for 50.1 years (26.8 ttl pk-yrs)    Types: Cigarettes    Start date: 65    Quit date: 12/18/2023    Years since quitting: 0.7   Smokeless tobacco: Never  Vaping Use   Vaping status: Former  Substance Use Topics   Alcohol use: Yes    Comment: ocassionally    Drug use: No     MEDICATIONS    Home Medication:    Current Medication:  Current Facility-Administered Medications:    acetaminophen  (TYLENOL ) tablet 650 mg, 650 mg, Oral, Q6H PRN, Niu, Xilin, MD   albuterol  (PROVENTIL ) (2.5 MG/3ML) 0.083% nebulizer solution 2.5 mg, 2.5 mg, Nebulization, Q2H PRN, Wouk, Devaughn Sayres, MD, 2.5 mg at 09/29/24 9090   ALPRAZolam  (XANAX ) tablet 0.25 mg, 0.25 mg, Oral, TID PRN, Niu, Xilin, MD   buPROPion  (WELLBUTRIN ) tablet 75 mg, 75 mg, Oral, BID, Niu, Xilin, MD, 75 mg at 10/01/24 0827   dextromethorphan-guaiFENesin (MUCINEX DM) 30-600 MG per 12 hr tablet 1 tablet, 1 tablet, Oral, BID PRN, Niu, Xilin, MD   DULoxetine  (CYMBALTA ) DR capsule 60 mg, 60 mg, Oral, Landrum Exon, Xilin, MD, 60 mg at 10/01/24 0605   enoxaparin  (LOVENOX ) injection 40 mg, 40 mg, Subcutaneous, Q24H, Niu, Xilin, MD, 40 mg at 10/01/24 0825   famotidine  (PEPCID ) tablet 40 mg, 40  mg, Oral, BID, Niu, Xilin, MD, 40 mg at 10/01/24 0825   feeding supplement (ENSURE PLUS HIGH PROTEIN) liquid 237 mL, 237 mL, Oral, BID BM, Niu, Xilin, MD, 237 mL at 10/01/24 1514   gemfibrozil  (LOPID ) tablet 600 mg, 600 mg, Oral, BID AC, Niu, Xilin, MD, 600 mg at 10/01/24 9173   hydrALAZINE  (APRESOLINE ) injection 5 mg, 5 mg, Intravenous, Q2H PRN, Niu, Xilin, MD   ipratropium-albuterol  (DUONEB) 0.5-2.5 (3) MG/3ML nebulizer solution 3 mL, 3 mL, Nebulization, TID, Wouk, Devaughn Sayres, MD, 3 mL at 10/01/24 1454   methylPREDNISolone  sodium succinate (SOLU-MEDROL ) 40 mg/mL injection 40 mg, 40 mg, Intravenous, Q12H, Alson Mcpheeters, MD   metoprolol  tartrate (LOPRESSOR ) tablet 12.5 mg, 12.5 mg, Oral, BID, Wouk, Devaughn Sayres, MD, 12.5 mg at 10/01/24 0825   multivitamin with minerals tablet 1 tablet, 1 tablet, Oral, Daily, Niu, Xilin, MD, 1 tablet at 10/01/24 0825   ondansetron  (ZOFRAN ) injection 4  mg, 4 mg, Intravenous, Q8H PRN, Niu, Xilin, MD   Oral care mouth rinse, 15 mL, Mouth Rinse, 4 times per day, Wouk, Devaughn Sayres, MD, 15 mL at 10/01/24 1515   Oral care mouth rinse, 15 mL, Mouth Rinse, PRN, Wouk, Devaughn Sayres, MD   piperacillin-tazobactam (ZOSYN) IVPB 3.375 g, 3.375 g, Intravenous, Q8H, Hallaji, Sheema M, RPH, Last Rate: 12.5 mL/hr at 10/01/24 1153, 3.375 g at 10/01/24 1153   sucralfate  (CARAFATE ) tablet 1 g, 1 g, Oral, QID, Niu, Xilin, MD, 1 g at 10/01/24 1513    ALLERGIES   Naproxen and Sulfa antibiotics     REVIEW OF SYSTEMS    Review of Systems:  Gen:  Denies  fever, sweats, chills weigh loss  HEENT: Denies blurred vision, double vision, ear pain, eye pain, hearing loss, nose bleeds, sore throat Cardiac:  No dizziness, chest pain or heaviness, chest tightness,edema Resp:   reports dyspnea chronically  Gi: Denies swallowing difficulty, stomach pain, nausea or vomiting, diarrhea, constipation, bowel incontinence Gu:  Denies bladder incontinence, burning urine Ext:   Denies Joint  pain, stiffness or swelling Skin: Denies  skin rash, easy bruising or bleeding or hives Endoc:  Denies polyuria, polydipsia , polyphagia or weight change Psych:   Denies depression, insomnia or hallucinations   Other:  All other systems negative   VS: BP (!) 148/76 (BP Location: Right Arm)   Pulse 82   Temp 98.2 F (36.8 C) (Oral)   Resp 20   Ht 5' 3 (1.6 m)   Wt 65.6 kg   SpO2 96%   BMI 25.62 kg/m      PHYSICAL EXAM    GENERAL:NAD, no fevers, chills, no weakness no fatigue HEAD: Normocephalic, atraumatic.  EYES: Pupils equal, round, reactive to light. Extraocular muscles intact. No scleral icterus.  MOUTH: Moist mucosal membrane. Dentition intact. No abscess noted.  EAR, NOSE, THROAT: Clear without exudates. No external lesions.  NECK: Supple. No thyromegaly. No nodules. No JVD.  PULMONARY: decreased breath sounds with mild rhonchi worse at bases bilaterally.  CARDIOVASCULAR: S1 and S2. Regular rate and rhythm. No murmurs, rubs, or gallops. No edema. Pedal pulses 2+ bilaterally.  GASTROINTESTINAL: Soft, nontender, nondistended. No masses. Positive bowel sounds. No hepatosplenomegaly.  MUSCULOSKELETAL: No swelling, clubbing, or edema. Range of motion full in all extremities.  NEUROLOGIC: Cranial nerves II through XII are intact. No gross focal neurological deficits. Sensation intact. Reflexes intact.  SKIN: No ulceration, lesions, rashes, or cyanosis. Skin warm and dry. Turgor intact.  PSYCHIATRIC: Mood, affect within normal limits. The patient is awake, alert and oriented x 3. Insight, judgment intact.       IMAGING     Narrative & Impression CLINICAL DATA:  Elevated D-dimer with dyspnea. Evaluate for pulmonary embolism.   EXAM: CT ANGIOGRAPHY CHEST WITH CONTRAST   TECHNIQUE: Multidetector CT imaging of the chest was performed using the standard protocol during bolus administration of intravenous contrast. Multiplanar CT image reconstructions and MIPs  were obtained to evaluate the vascular anatomy.   RADIATION DOSE REDUCTION: This exam was performed according to the departmental dose-optimization program which includes automated exposure control, adjustment of the mA and/or kV according to patient size and/or use of iterative reconstruction technique.   CONTRAST:  75mL OMNIPAQUE  IOHEXOL  350 MG/ML SOLN   COMPARISON:  09/06/2024 and 11/29/2022   FINDINGS: Cardiovascular: Heart is normal size. There is calcified plaque over the left anterior descending coronary artery. Thoracic aorta is normal in caliber. Mild calcified plaque over the descending thoracic  aorta. Pulmonary arterial system is well opacified without evidence of emboli. Remaining vascular structures are unremarkable.   Mediastinum/Nodes: No evidence of mediastinal or hilar adenopathy. Remaining mediastinal structures are unremarkable.   Lungs/Pleura: Lungs are adequately inflated. There is chronic collapse/atelectasis over the central right middle lobe with interval development of 2 small cavitary like foci over the collapsed central right middle lobe measuring approximately 1.3 and 1.1 cm respectively. There is a patchy airspace process over the right upper lobe and minimally over the medial left upper lobe likely due to an infectious or inflammatory process. Stable scarring over the lingula. No effusion. Mild tracheobronchomalacia unchanged from the recent prior exam from October although worse compared to January 2024.   Upper Abdomen: Calcified plaque over the visualized upper abdomen. Remaining upper abdominal structures are unchanged without acute findings.   Musculoskeletal: Interval worsening of a severe T6 compression fracture with mild impression on the anterior thecal sac from the posterior border of the compression fracture. New anterior wedging of T5.   Review of the MIP images confirms the above findings.   IMPRESSION: 1. No evidence of  pulmonary embolism. 2. Chronic collapse/atelectasis over the central right middle lobe with interval development of 2 small cavitary like foci over the collapsed central right middle lobe measuring approximately 1.3 and 1.1 cm respectively. Findings may be due to an infectious or inflammatory process and less likely developing neoplastic process. This should be re-evaluated on the follow-up CT recommended below. 3. Patchy airspace process over the right upper lobe and minimally over the medial left upper lobe likely due to an infectious or inflammatory process. Recommend follow-up CT 6-8 weeks. 4. Mild tracheobronchomalacia unchanged from the recent prior exam from October, although worse compared to January 2024. 5. Interval worsening of a severe T6 compression fracture with new anterior wedging of T5. 6. Aortic atherosclerosis. Atherosclerotic coronary artery disease.   Aortic Atherosclerosis (ICD10-I70.0).     Electronically Signed   By: Toribio Agreste M.D.   On: 09/30/2024 10:59 ASSESSMENT/PLAN   Acute on chronic hypoxemic respiratory failure     Due to AECOPD with pneumonia     - new infiltrates with cavitary component bilaterally worse at RML     - possible pseudomonas will obtain cultures and empirically cover with Zosyn     - continue steroids for AECOPD    - Flutter valve for mucus plugging and IS for chest PT      Community acquired pneumonia- RESOLVED       WILL narrow her regimen to Levofloxacin  PO in prep for dc home     Reducing steroids , currently from 80 to 40 bid and will taper to PO prednisone      -please hold her dose of dupixent which is due this week     -Viral Triplet and RVP is negative     -sputum cultures in process  Complete atelectasis of RML      - patient would benefit from chest PT, please do NOT use VEST therapy due to severe spinal fracture       - favor using metaneb q8h    Severe worsening of T6 compression fracture        - have reached  out to NSGY for additional guidance         - patient has been complaining of pain for past 6 months        - we have treated her previosly with low dose narcotics but discontinued this due to dependence  potential.  She has been compliant but suffers with pain      Thank you for allowing me to participate in the care of this patient.   Patient/Family are satisfied with care plan and all questions have been answered.    Provider disclosure: Patient with at least one acute or chronic illness or injury that poses a threat to life or bodily function and is being managed actively during this encounter.  All of the below services have been performed independently by signing provider:  review of prior documentation from internal and or external health records.  Review of previous and current lab results.  Interview and comprehensive assessment during patient visit today. Review of current and previous chest radiographs/CT scans. Discussion of management and test interpretation with health care team and patient/family.   This document was prepared using Dragon voice recognition software and may include unintentional dictation errors.     Joao Mccurdy, M.D.  Division of Pulmonary & Critical Care Medicine

## 2024-10-01 NOTE — Progress Notes (Addendum)
 PROGRESS NOTE    [name]  [name]:[id number] DOB: March 16, 1956 DOA: 09/28/2024 PCP: Rachael Ophelia JINNY DOUGLAS, MD  Outpatient Specialists: pulm    Brief Narrative:   From admission h and p  Rachael Blankenship is a 68 y.o. female with medical history significant of COPD on 2L O2, former smoker, HTN, HLD, dCHF, pulmonary HTN, GERD, depression with anxiety, recent COVID infection in September, who presents with SOB.   Pt had COVID-19 infection in September 2025.  She continues to have worsening respiratory symptoms. Pt was seen by Dr. Theotis pulmonology who ordered CTA, which was negative for PE, but showed unchanged complete collapse of the right middle lobe on 10/13. Dr. Ronita of pulmonology performed flexible bronchoscopy with BAL, and found patient had complete collapse of right middle lobe with mucous collapse on 10/24.  The culture from BAL was negative.   Pt states that she continues to have SOB, which has been progressively worsening in the past several days.  Patient has a nonproductive cough.  No chest pain, fever or chills.  No nausea, vomiting, diarrhea or abdominal pain.  No symptoms of UTI. Patient uses 2 L oxygen  at baseline, but was found to have severe respiratory distress, could not speak in full sentence, oxygen  desaturation to 75%. EMS started nonrebreather, then changed to CPAP, still has severe respiratory distress.  BiPAP is started in ED.  Patient was given 125 mg of Solu-Medrol  and 2 g magnesium  sulfate by EMS.  Assessment & Plan:   Principal Problem:   COPD exacerbation (HCC) Active Problems:   Acute on chronic respiratory failure with hypoxia and hypercapnia (HCC)   Pulmonary HTN (HCC)   Chronic diastolic CHF (congestive heart failure) (HCC)   Hypertension   Hyperlipemia   Hypokalemia   AKI (acute kidney injury)   Depression with anxiety   Cigarette smoker  # Acute on chronic hypoxic hypercarbic respiratory failure # RML collapse # CAP? On 2 liters at home  with bipap at bedtime. Abrupt dyspnea and hypoxia evening of 11/4, much improved with bipap overnight. Trop and bnp negative. Treating for copd exacerbation as below. CT shows chronic collapse or RML with 2 cafitary foci also patchy airspace opacities right side. No PE. Respiratory panels negative - bipap prn - doxy > doxy/ceftriaxone  > zosyn per pulm for pseudomonal coverage - appreciate pulm recs - sputum for culture if able to produce  # COPD with acute exacerbation - continue steroids, doxy/ceftriaxone , breathing treatments. Converting steroids to po - on dupilumab and breztri at home, pulm advises holding dupilumab fornow  # Thoracic compression fracture Chronic but worsening, described as severe in mri, no red flag symptoms. Neurosurg advises mri, showing severe t6 compression fracture with some retropulsion. - neurosurgery advises close outpt f/u, scheduled for 11/11 - consider TLSO brace at d/c when out of bed  # AKI Resolved. May be 2/2 lasix , also regular nsaid - monitor  # Hypokalemia Resolved - monitor  # GAD - home xanax , duloxetine   # HFpEF Doesn't appear exacerbated, bnp wnl - holding home lasix  given aki and hypokalemia - have reduced dose of home metop from 25 to 12.5, given severe copd will need to review indications and consider weaning off completely  # ACP 10 min discussion 11/5 of goals of care, prognosis, etc. Patient is leaning toward dnr/dni - full code for now, patient continues to contemplate her code status  # Debility - PT consulted, advises home health  DVT prophylaxis: lovenox  Code Status: full  Family  Communication: husband at bedside 11/7  Level of care: Med-Surg Status is: Inpatient Remains inpatient appropriate because: severity of illness    Consultants:  pulm  Procedures: none  Antimicrobials:  See above    Subjective: Reports ongoing dyspnea, cough not productive. Dyspnea improving however  Objective: Vitals:    09/30/24 2048 10/01/24 0250 10/01/24 0328 10/01/24 0824  BP:   (!) 158/84 (!) 148/76  Pulse:   76 82  Resp:   16 20  Temp:   (!) 97.5 F (36.4 C) 98.2 F (36.8 C)  TempSrc:   Oral Oral  SpO2: 97% 96% 96% 96%  Weight:   65.6 kg   Height:        Intake/Output Summary (Last 24 hours) at 10/01/2024 1340 Last data filed at 10/01/2024 0924 Gross per 24 hour  Intake 580.86 ml  Output 300 ml  Net 280.86 ml   Filed Weights   09/30/24 0355 10/01/24 0328  Weight: 64.3 kg 65.6 kg    Examination:  General exam: Appears calm and comfortable  Respiratory system: few faint rhonchi, wheeze improving, tachypneic but speaking in full sentences Cardiovascular system: S1 & S2 heard, RRR.   Gastrointestinal system: Abdomen is nondistended, soft and nontender.  Central nervous system: Alert and oriented. No focal neurological deficits. Extremities: Symmetric 5 x 5 power. Skin: No rashes, lesions or ulcers Psychiatry: Judgement and insight appear normal. Mood & affect appropriate.     Data Reviewed: I have personally reviewed following labs and imaging studies  CBC: Recent Labs  Lab 09/28/24 2347 09/29/24 0610  WBC 14.8* 10.9*  NEUTROABS 8.9*  --   HGB 12.1 10.9*  HCT 38.2 35.2*  MCV 87.0 87.3  PLT 463* 388   Basic Metabolic Panel: Recent Labs  Lab 09/28/24 2347 09/29/24 0610 09/30/24 0552 10/01/24 0524  NA 140 139 138 141  K 2.9* 3.8 3.1* 3.7  CL 91* 95* 94* 101  CO2 35* 34* 33* 32  GLUCOSE 188* 182* 115* 104*  BUN 23 21 29* 27*  CREATININE 1.11* 0.77 0.78 0.68  CALCIUM 9.0 8.7* 9.1 8.9  MG 3.9*  --  2.0  --   PHOS 4.2  --   --   --    GFR: Estimated Creatinine Clearance: 59.6 mL/min (by C-G formula based on SCr of 0.68 mg/dL). Liver Function Tests: No results for input(s): AST, ALT, ALKPHOS, BILITOT, PROT, ALBUMIN in the last 168 hours. No results for input(s): LIPASE, AMYLASE in the last 168 hours. No results for input(s): AMMONIA in the last 168  hours. Coagulation Profile: No results for input(s): INR, PROTIME in the last 168 hours. Cardiac Enzymes: No results for input(s): CKTOTAL, CKMB, CKMBINDEX, TROPONINI in the last 168 hours. BNP (last 3 results) No results for input(s): PROBNP in the last 8760 hours. HbA1C: No results for input(s): HGBA1C in the last 72 hours. CBG: No results for input(s): GLUCAP in the last 168 hours. Lipid Profile: No results for input(s): CHOL, HDL, LDLCALC, TRIG, CHOLHDL, LDLDIRECT in the last 72 hours. Thyroid  Function Tests: No results for input(s): TSH, T4TOTAL, FREET4, T3FREE, THYROIDAB in the last 72 hours. Anemia Panel: No results for input(s): VITAMINB12, FOLATE, FERRITIN, TIBC, IRON, RETICCTPCT in the last 72 hours. Urine analysis:    Component Value Date/Time   COLORURINE YELLOW (A) 05/21/2018 1759   APPEARANCEUR Turbid (A) 09/13/2021 1708   LABSPEC 1.006 05/21/2018 1759   PHURINE 6.0 05/21/2018 1759   GLUCOSEU Negative 09/13/2021 1708   HGBUR NEGATIVE 05/21/2018 1759  BILIRUBINUR Negative 09/13/2021 1708   KETONESUR NEGATIVE 05/21/2018 1759   PROTEINUR Negative 09/13/2021 1708   PROTEINUR NEGATIVE 05/21/2018 1759   NITRITE Negative 09/13/2021 1708   NITRITE NEGATIVE 05/21/2018 1759   LEUKOCYTESUR 1+ (A) 09/13/2021 1708   Sepsis Labs: @LABRCNTIP (procalcitonin:4,lacticidven:4)  ) Recent Results (from the past 240 hours)  Resp panel by RT-PCR (RSV, Flu A&B, Covid) Anterior Nasal Swab     Status: None   Collection Time: 09/28/24 11:52 PM   Specimen: Anterior Nasal Swab  Result Value Ref Range Status   SARS Coronavirus 2 by RT PCR NEGATIVE NEGATIVE Final    Comment: (NOTE) SARS-CoV-2 target nucleic acids are NOT DETECTED.  The SARS-CoV-2 RNA is generally detectable in upper respiratory specimens during the acute phase of infection. The lowest concentration of SARS-CoV-2 viral copies this assay can detect is 138 copies/mL. A  negative result does not preclude SARS-Cov-2 infection and should not be used as the sole basis for treatment or other patient management decisions. A negative result may occur with  improper specimen collection/handling, submission of specimen other than nasopharyngeal swab, presence of viral mutation(s) within the areas targeted by this assay, and inadequate number of viral copies(<138 copies/mL). A negative result must be combined with clinical observations, patient history, and epidemiological information. The expected result is Negative.  Fact Sheet for Patients:  bloggercourse.com  Fact Sheet for Healthcare Providers:  seriousbroker.it  This test is no t yet approved or cleared by the [place]  FDA and  has been authorized for detection and/or diagnosis of SARS-CoV-2 by FDA under an Emergency Use Authorization (EUA). This EUA will remain  in effect (meaning this test can be used) for the duration of the COVID-19 declaration under Section 564(b)(1) of the Act, 21 U.S.C.section 360bbb-3(b)(1), unless the authorization is terminated  or revoked sooner.       Influenza A by PCR NEGATIVE NEGATIVE Final   Influenza B by PCR NEGATIVE NEGATIVE Final    Comment: (NOTE) The Xpert Xpress SARS-CoV-2/FLU/RSV plus assay is intended as an aid in the diagnosis of influenza from Nasopharyngeal swab specimens and should not be used as a sole basis for treatment. Nasal washings and aspirates are unacceptable for Xpert Xpress SARS-CoV-2/FLU/RSV testing.  Fact Sheet for Patients: bloggercourse.com  Fact Sheet for Healthcare Providers: seriousbroker.it  This test is not yet approved or cleared by the [place]  FDA and has been authorized for detection and/or diagnosis of SARS-CoV-2 by FDA under an Emergency Use Authorization (EUA). This EUA will remain in effect (meaning this test can  be used) for the duration of the COVID-19 declaration under Section 564(b)(1) of the Act, 21 U.S.C. section 360bbb-3(b)(1), unless the authorization is terminated or revoked.     Resp Syncytial Virus by PCR NEGATIVE NEGATIVE Final    Comment: (NOTE) Fact Sheet for Patients: bloggercourse.com  Fact Sheet for Healthcare Providers: seriousbroker.it  This test is not yet approved or cleared by the [place]  FDA and has been authorized for detection and/or diagnosis of SARS-CoV-2 by FDA under an Emergency Use Authorization (EUA). This EUA will remain in effect (meaning this test can be used) for the duration of the COVID-19 declaration under Section 564(b)(1) of the Act, 21 U.S.C. section 360bbb-3(b)(1), unless the authorization is terminated or revoked.  Performed at [facility], [address] [place], [place] [address]   Respiratory (~20 pathogens) panel by PCR     Status: None   Collection Time: 09/29/24  9:31 AM   Specimen: Nasopharyngeal Swab;  Respiratory  Result Value Ref Range Status   Adenovirus NOT DETECTED NOT DETECTED Final   Coronavirus 229E NOT DETECTED NOT DETECTED Final    Comment: (NOTE) The Coronavirus on the Respiratory Panel, DOES NOT test for the novel  Coronavirus (2019 nCoV)    Coronavirus HKU1 NOT DETECTED NOT DETECTED Final   Coronavirus NL63 NOT DETECTED NOT DETECTED Final   Coronavirus OC43 NOT DETECTED NOT DETECTED Final   Metapneumovirus NOT DETECTED NOT DETECTED Final   Rhinovirus / Enterovirus NOT DETECTED NOT DETECTED Final   Influenza A NOT DETECTED NOT DETECTED Final   Influenza B NOT DETECTED NOT DETECTED Final   Parainfluenza Virus 1 NOT DETECTED NOT DETECTED Final   Parainfluenza Virus 2 NOT DETECTED NOT DETECTED Final   Parainfluenza Virus 3 NOT DETECTED NOT DETECTED Final   Parainfluenza Virus 4 NOT DETECTED NOT DETECTED Final   Respiratory Syncytial Virus NOT DETECTED NOT  DETECTED Final   Bordetella pertussis NOT DETECTED NOT DETECTED Final   Bordetella Parapertussis NOT DETECTED NOT DETECTED Final   Chlamydophila pneumoniae NOT DETECTED NOT DETECTED Final   Mycoplasma pneumoniae NOT DETECTED NOT DETECTED Final    Comment: Performed at [facility], [address] [place], [place] [address]         Radiology Studies: CT Angio Chest Pulmonary Embolism (PE) W or WO Contrast Result Date: 09/30/2024 CLINICAL DATA:  Elevated D-dimer with dyspnea. Evaluate for pulmonary embolism. EXAM: CT ANGIOGRAPHY CHEST WITH CONTRAST TECHNIQUE: Multidetector CT imaging of the chest was performed using the standard protocol during bolus administration of intravenous contrast. Multiplanar CT image reconstructions and MIPs were obtained to evaluate the vascular anatomy. RADIATION DOSE REDUCTION: This exam was performed according to the departmental dose-optimization program which includes automated exposure control, adjustment of the mA and/or kV according to patient size and/or use of iterative reconstruction technique. CONTRAST:  75mL OMNIPAQUE  IOHEXOL  350 MG/ML SOLN COMPARISON:  09/06/2024 and 11/29/2022 FINDINGS: Cardiovascular: Heart is normal size. There is calcified plaque over the left anterior descending coronary artery. Thoracic aorta is normal in caliber. Mild calcified plaque over the descending thoracic aorta. Pulmonary arterial system is well opacified without evidence of emboli. Remaining vascular structures are unremarkable. Mediastinum/Nodes: No evidence of mediastinal or hilar adenopathy. Remaining mediastinal structures are unremarkable. Lungs/Pleura: Lungs are adequately inflated. There is chronic collapse/atelectasis over the central right middle lobe with interval development of 2 small cavitary like foci over the collapsed central right middle lobe measuring approximately 1.3 and 1.1 cm respectively. There is a patchy airspace process over the right upper lobe  and minimally over the medial left upper lobe likely due to an infectious or inflammatory process. Stable scarring over the lingula. No effusion. Mild tracheobronchomalacia unchanged from the recent prior exam from October although worse compared to January 2024. Upper Abdomen: Calcified plaque over the visualized upper abdomen. Remaining upper abdominal structures are unchanged without acute findings. Musculoskeletal: Interval worsening of a severe T6 compression fracture with mild impression on the anterior thecal sac from the posterior border of the compression fracture. New anterior wedging of T5. Review of the MIP images confirms the above findings. IMPRESSION: 1. No evidence of pulmonary embolism. 2. Chronic collapse/atelectasis over the central right middle lobe with interval development of 2 small cavitary like foci over the collapsed central right middle lobe measuring approximately 1.3 and 1.1 cm respectively. Findings may be due to an infectious or inflammatory process and less likely developing neoplastic process. This should be re-evaluated on the follow-up CT  recommended below. 3. Patchy airspace process over the right upper lobe and minimally over the medial left upper lobe likely due to an infectious or inflammatory process. Recommend follow-up CT 6-8 weeks. 4. Mild tracheobronchomalacia unchanged from the recent prior exam from October, although worse compared to January 2024. 5. Interval worsening of a severe T6 compression fracture with new anterior wedging of T5. 6. Aortic atherosclerosis. Atherosclerotic coronary artery disease. Aortic Atherosclerosis (ICD10-I70.0). Electronically Signed   By: Toribio Agreste M.D.   On: 09/30/2024 10:59        Scheduled Meds:  buPROPion   75 mg Oral BID   DULoxetine   60 mg Oral [id number]   enoxaparin  (LOVENOX ) injection  40 mg Subcutaneous Q24H   famotidine   40 mg Oral BID   feeding supplement  237 mL Oral BID BM   gemfibrozil   600 mg Oral BID AC    ipratropium-albuterol   3 mL Nebulization TID   methylPREDNISolone  (SOLU-MEDROL ) injection  40 mg Intravenous Q12H   metoprolol  tartrate  12.5 mg Oral BID   multivitamin with minerals  1 tablet Oral Daily   mouth rinse  15 mL Mouth Rinse 4 times per day   sucralfate   1 g Oral QID   Continuous Infusions:  piperacillin-tazobactam (ZOSYN)  IV 3.375 g (10/01/24 1153)     LOS: 2 days     Devaughn KATHEE Ban, MD [facility]   If 7PM-7AM, please contact night-coverage [email or web address] Password [email or web address] 10/01/2024, 1:40 PM

## 2024-10-01 NOTE — Plan of Care (Signed)
  Problem: Education: Goal: Knowledge of General Education information will improve Description: Including pain rating scale, medication(s)/side effects and non-pharmacologic comfort measures Outcome: Progressing   Problem: Clinical Measurements: Goal: Respiratory complications will improve Outcome: Progressing   Problem: Activity: Goal: Risk for activity intolerance will decrease Outcome: Progressing   Problem: Nutrition: Goal: Adequate nutrition will be maintained Outcome: Progressing   Problem: Pain Managment: Goal: General experience of comfort will improve and/or be controlled Outcome: Progressing

## 2024-10-02 ENCOUNTER — Inpatient Hospital Stay

## 2024-10-02 DIAGNOSIS — J9621 Acute and chronic respiratory failure with hypoxia: Secondary | ICD-10-CM | POA: Diagnosis not present

## 2024-10-02 DIAGNOSIS — J441 Chronic obstructive pulmonary disease with (acute) exacerbation: Secondary | ICD-10-CM | POA: Diagnosis not present

## 2024-10-02 DIAGNOSIS — E876 Hypokalemia: Secondary | ICD-10-CM | POA: Diagnosis not present

## 2024-10-02 DIAGNOSIS — N179 Acute kidney failure, unspecified: Secondary | ICD-10-CM

## 2024-10-02 DIAGNOSIS — J439 Emphysema, unspecified: Secondary | ICD-10-CM | POA: Diagnosis not present

## 2024-10-02 DIAGNOSIS — F17210 Nicotine dependence, cigarettes, uncomplicated: Secondary | ICD-10-CM

## 2024-10-02 DIAGNOSIS — I700 Atherosclerosis of aorta: Secondary | ICD-10-CM | POA: Diagnosis not present

## 2024-10-02 DIAGNOSIS — R0603 Acute respiratory distress: Secondary | ICD-10-CM

## 2024-10-02 LAB — BASIC METABOLIC PANEL WITH GFR
Anion gap: 11 (ref 5–15)
BUN: 25 mg/dL — ABNORMAL HIGH (ref 8–23)
CO2: 26 mmol/L (ref 22–32)
Calcium: 8.9 mg/dL (ref 8.9–10.3)
Chloride: 102 mmol/L (ref 98–111)
Creatinine, Ser: 0.68 mg/dL (ref 0.44–1.00)
GFR, Estimated: >60 mL/min (ref >60)
Glucose, Bld: 142 mg/dL — ABNORMAL HIGH (ref 70–99)
Potassium: 4 mmol/L (ref 3.5–5.1)
Sodium: 139 mmol/L (ref 135–145)

## 2024-10-02 LAB — LEGIONELLA PNEUMOPHILA SEROGP 1 UR AG: L. pneumophila Serogp 1 Ur Ag: NEGATIVE

## 2024-10-02 MED ORDER — ENSURE PLUS HIGH PROTEIN PO LIQD: 237.0000 mL | Freq: Two times a day (BID) | ORAL | 2 refills | Status: DC

## 2024-10-02 MED ORDER — LEVOFLOXACIN 750 MG PO TABS: 750.0000 mg | ORAL_TABLET | Freq: Every day | ORAL | 0 refills | Status: AC

## 2024-10-02 MED ORDER — PREDNISONE 50 MG PO TABS: 50.0000 mg | ORAL_TABLET | Freq: Every day | ORAL | 0 refills | Status: DC

## 2024-10-02 MED ORDER — PREDNISONE 50 MG PO TABS: 50.0000 mg | ORAL_TABLET | Freq: Every day | ORAL | 0 refills | Status: AC

## 2024-10-02 MED ORDER — METOPROLOL TARTRATE 25 MG PO TABS: 12.5000 mg | ORAL_TABLET | Freq: Two times a day (BID) | ORAL | 1 refills | Status: DC

## 2024-10-02 MED ORDER — DM-GUAIFENESIN ER 30-600 MG PO TB12: 1.0000 | ORAL_TABLET | Freq: Two times a day (BID) | ORAL | 0 refills | Status: DC | PRN

## 2024-10-02 MED ORDER — LEVOFLOXACIN 750 MG PO TABS: 750.0000 mg | ORAL_TABLET | Freq: Every day | ORAL | 0 refills | Status: DC

## 2024-10-02 MED ORDER — PREDNISONE 50 MG PO TABS: 50.0000 mg | ORAL_TABLET | Freq: Every day | ORAL | Status: DC

## 2024-10-02 NOTE — Discharge Summary (Signed)
 Physician Discharge Summary   Patient: Rachael Blankenship MRN: [id number] DOB: June 17, 1956  Admit date:     09/28/2024  Discharge date: 10/02/24  Discharge Physician: Amaryllis Dare   PCP: Fernande Ophelia JINNY DOUGLAS, MD   Recommendations at discharge:  Please obtain CBC and BMP and follow-up Please ensure completion of antibiotics Patient will need repeat CT chest in 6 to 8 weeks Kyphoplasty as outpatient has been scheduled by neurosurgery. Follow-up with primary care provider Follow-up with pulmonology  Discharge Diagnoses: Principal Problem:   COPD exacerbation (HCC) Active Problems:   Acute on chronic respiratory failure with hypoxia and hypercapnia (HCC)   Pulmonary HTN (HCC)   Chronic diastolic CHF (congestive heart failure) (HCC)   Hypertension   Hyperlipemia   Hypokalemia   AKI (acute kidney injury)   Depression with anxiety   Acute hypoxemic respiratory failure (HCC)   Cigarette smoker   Hypermagnesemia   Respiratory distress   Hospital Course: Rachael Blankenship is a 68 y.o. female with medical history significant of COPD on 2L O2, former smoker, HTN, HLD, dCHF, pulmonary HTN, GERD, depression with anxiety, recent COVID infection in September, who presents with SOB.   Pt had COVID-19 infection in September 2025.  She continues to have worsening respiratory symptoms. Pt was seen by Dr. Theotis pulmonology who ordered CTA, which was negative for PE, but showed unchanged complete collapse of the right middle lobe on 10/13. Dr. Ronita of pulmonology performed flexible bronchoscopy with BAL, and found patient had complete collapse of right middle lobe with mucous collapse on 10/24.  The culture from BAL was negative.  Patient was found to be in respiratory distress on presentation and desaturating to 75%, treated with BiPAP initially, slowly improved and now back to baseline oxygen  use of 2 L.   She was treated for COPD exacerbation, and is being discharged on 5 more days of Levaquin   and prednisone .  Pulmonary was also consulted.  Patient will need a repeat CT chest in 6 to 8 weeks to see the resolution of current abnormalities.  Repeat chest x-ray on the day of discharge was without any new abnormality.  She was also found to have her worsening of her chronic T6 compression fracture.  Neurosurgery was consulted and patient is being scheduled for kyphoplasty on 10/05/2024 as outpatient.  She need to continue using TLSO brace when out of bed.  Patient was found to have AKI which was resolved, home Lasix  was initially held and she will continue on discharge.  Also found to have hypokalemia which has been resolved.  Patient has an history of chronic HFpEF and does not appear to be in exacerbation, BNP was normal.  She will continue home medications and follow-up with her providers.  Home metoprolol  dose was decreased to 12.5 mg twice daily.  Physical therapist evaluated her and recommended home health PT which was ordered.  Patient will continue her current medications and need to have a close follow-up with her providers for further assistance.  Consultants: Pulmonary.  Neurosurgery Procedures performed: None Disposition: Home with home health Diet recommendation:  Discharge Diet Orders (From admission, onward)     Start     Ordered   10/02/24 0000  Diet - low sodium heart healthy        10/02/24 1434           Cardiac diet DISCHARGE MEDICATION: Allergies as of 10/02/2024       Reactions   Naproxen Itching   Sulfa Antibiotics Hives, Rash  Medication List     STOP taking these medications    HYDROcodone-acetaminophen  5-325 MG tablet Commonly known as: NORCO/VICODIN   ibuprofen 800 MG tablet Commonly known as: ADVIL   traMADol 50 MG tablet Commonly known as: ULTRAM       TAKE these medications    ALPRAZolam  0.25 MG tablet Commonly known as: XANAX  Take 1 tablet (0.25 mg total) by mouth 3 (three) times daily as needed for anxiety.    ascorbic acid  500 MG tablet Commonly known as: VITAMIN C  Take 500 mg by mouth daily.   aspirin  81 MG tablet Take 81 mg by mouth at bedtime.   Breztri Aerosphere 160-9-4.8 MCG/ACT Aero inhaler Generic drug: budesonide -glycopyrrolate -formoterol  Inhale 2 puffs into the lungs 2 (two) times daily.   buPROPion  75 MG tablet Commonly known as: WELLBUTRIN  Take 75 mg by mouth 2 (two) times daily.   Cholecalciferol  25 MCG (1000 UT) tablet Take 1,000 Units by mouth daily.   Co Q-10 100 MG Caps Take 100 mg by mouth daily.   Combivent  Respimat 20-100 MCG/ACT Aers respimat Generic drug: Ipratropium-Albuterol  INHALE 1 PUFF INTO THE LUNGS EVERY 6 HOURS AS NEEDED FOR WHEEZING OR SHORTNESS OF BREATH What changed:  See the new instructions. Another medication with the same name was removed. Continue taking this medication, and follow the directions you see here.   Denta 5000 Plus 1.1 % Crea dental cream Generic drug: sodium fluoride Take 1 application  by mouth 2 (two) times daily as needed.   dextromethorphan-guaiFENesin 30-600 MG 12hr tablet Commonly known as: MUCINEX DM Take 1 tablet by mouth 2 (two) times daily as needed for cough.   DULoxetine  60 MG capsule Commonly known as: CYMBALTA  Take 1 capsule (60 mg total) by mouth daily. What changed: when to take this   Dupilumab 300 MG/2ML Soaj Inject 300 mg into the skin every 14 (fourteen) days.   EPINEPHrine  0.3 mg/0.3 mL Soaj injection Commonly known as: EPI-PEN Inject 0.3 mg into the muscle as needed.   famotidine  40 MG tablet Commonly known as: PEPCID  Take 1 tablet (40 mg total) by mouth 2 (two) times daily.   feeding supplement Liqd Take 237 mLs by mouth 2 (two) times daily between meals. Start taking on: October 03, 2024   fluticasone  50 MCG/ACT nasal spray Commonly known as: FLONASE  Place 2 sprays into both nostrils daily.   furosemide  40 MG tablet Commonly known as: LASIX  TAKE 1 TABLET BY MOUTH DAILY What changed:  when to take this   gemfibrozil  600 MG tablet Commonly known as: LOPID  Take 1 tablet (600 mg total) by mouth 2 (two) times daily before a meal.   levofloxacin  750 MG tablet Commonly known as: LEVAQUIN  Take 1 tablet (750 mg total) by mouth daily for 5 days. Start taking on: October 03, 2024   Magnesium  250 MG Caps Take 250 mg by mouth daily.   metoprolol  tartrate 25 MG tablet Commonly known as: LOPRESSOR  Take 0.5 tablets (12.5 mg total) by mouth 2 (two) times daily. What changed: how much to take   multivitamin with minerals tablet Take 1 tablet by mouth daily. Centrum Silver 50+   OXYGEN  Inhale 2 L into the lungs continuous. Continuous oxygen  and NIV at night, 2 liters   pt uses [facility] for oxygen  supplies   Potassium Chloride  CR 8 MEQ Cpcr capsule CR Commonly known as: MICRO-K  Take 1 capsule (8 mEq total) by mouth daily. What changed: when to take this   predniSONE  50 MG tablet Commonly known as:  DELTASONE  Take 1 tablet (50 mg total) by mouth daily with breakfast for 5 days.   sucralfate  1 g tablet Commonly known as: CARAFATE  Take 1 g by mouth 4 (four) times daily as needed (Stomach). What changed: when to take this   vitamin E  180 MG (400 UNITS) capsule Take 400 Units by mouth daily.        Follow-up Information     Fernande Ophelia PARAS III, MD. Schedule an appointment as soon as possible for a visit in 1 week(s).   Specialty: Internal Medicine Contact information: [address] [facility] [place] [address] [phone]         Parris Manna, MD. Schedule an appointment as soon as possible for a visit.   Specialty: Pulmonary Disease Contact information: [address] [place] [address] [phone]                Discharge Exam: Fredricka Weights   09/30/24 0355 10/01/24 0328  Weight: 64.3 kg 65.6 kg   General.  Frail elderly lady, in no acute distress. Pulmonary.  Few scattered rhonchi bilaterally, normal respiratory  effort. CV.  Regular rate and rhythm, no JVD, rub or murmur. Abdomen.  Soft, nontender, nondistended, BS positive. CNS.  Alert and oriented .  No focal neurologic deficit. Extremities.  No edema,  pulses intact and symmetrical. Psychiatry.  Judgment and insight appears normal.   Condition at discharge: stable  The results of significant diagnostics from this hospitalization (including imaging, microbiology, ancillary and laboratory) are listed below for reference.   Imaging Studies: DG Chest Port 1 View Result Date: 10/02/2024 EXAM: 1 VIEW(S) XRAY OF THE CHEST 10/02/2024 11:17:46 AM COMPARISON: 09/29/2024 CLINICAL HISTORY: Pneumonia FINDINGS: LUNGS AND PLEURA: Emphysema. No pulmonary edema. No pleural effusion. No pneumothorax. HEART AND MEDIASTINUM: Aortic atherosclerosis. No acute abnormality of the cardiac and mediastinal silhouettes. BONES AND SOFT TISSUES: Old healed right rib fractures. IMPRESSION: 1. No acute cardiopulmonary process. Electronically signed by: Norman Gatlin MD 10/02/2024 02:08 PM EST RP Workstation: [id number]   MR THORACIC SPINE WO CONTRAST Result Date: 10/01/2024 EXAM: MR Thoracic Spine without 10/01/2024 02:41:52 PM TECHNIQUE: Multiplanar multisequence MRI of the thoracic spine was performed without the administration of intravenous contrast. COMPARISON: CTA chest 09/30/2024 and 09/06/2024 CLINICAL HISTORY: compression fracture FINDINGS: The examination is moderately motion degraded. BONES AND ALIGNMENT: Increased upper thoracic kyphosis. No significant listhesis. T6 compression fracture with approximately 75% vertebral body height loss, sclerosis, and edema with height loss greatly progressing from 09/06/2024. T5 inferior endplate compression fracture with 30% anterior vertebral body height loss and moderate marrow edema, new from 09/06/2024. No suspicious marrow lesion. SPINAL CORD: No definite thoracic spinal cord signal abnormality is identified; however, motion  artifact severely limits assessment in the upper thoracic spine. SOFT TISSUES: Unremarkable. DEGENERATIVE CHANGES: T6 retropulsion and prominent dorsal epidural fat result in moderate spinal stenosis. Retropulsion also contributes to severe bilateral T6 neural foraminal stenosis. Mild disc bulging at T11-T12 without stenosis. No neural foraminal narrowing. IMPRESSION: 1. Severe T6 compression fracture with 75% vertebral body height loss, sclerosis, and edema. Retropulsion results in mild to moderate spinal stenosis and severe bilateral T6 neural foraminal stenosis. 2. T5 inferior endplate compression fracture with 30% anterior vertebral body height loss and moderate marrow edema, new from 09/06/2024. Electronically signed by: Dasie [place] MD 10/01/2024 04:11 PM EST RP Workstation: [id number]   CT Angio Chest Pulmonary Embolism (PE) W or WO Contrast Result Date: 09/30/2024 CLINICAL DATA:  Elevated D-dimer with dyspnea. Evaluate for  pulmonary embolism. EXAM: CT ANGIOGRAPHY CHEST WITH CONTRAST TECHNIQUE: Multidetector CT imaging of the chest was performed using the standard protocol during bolus administration of intravenous contrast. Multiplanar CT image reconstructions and MIPs were obtained to evaluate the vascular anatomy. RADIATION DOSE REDUCTION: This exam was performed according to the departmental dose-optimization program which includes automated exposure control, adjustment of the mA and/or kV according to patient size and/or use of iterative reconstruction technique. CONTRAST:  75mL OMNIPAQUE  IOHEXOL  350 MG/ML SOLN COMPARISON:  09/06/2024 and 11/29/2022 FINDINGS: Cardiovascular: Heart is normal size. There is calcified plaque over the left anterior descending coronary artery. Thoracic aorta is normal in caliber. Mild calcified plaque over the descending thoracic aorta. Pulmonary arterial system is well opacified without evidence of emboli. Remaining vascular structures are unremarkable. Mediastinum/Nodes:  No evidence of mediastinal or hilar adenopathy. Remaining mediastinal structures are unremarkable. Lungs/Pleura: Lungs are adequately inflated. There is chronic collapse/atelectasis over the central right middle lobe with interval development of 2 small cavitary like foci over the collapsed central right middle lobe measuring approximately 1.3 and 1.1 cm respectively. There is a patchy airspace process over the right upper lobe and minimally over the medial left upper lobe likely due to an infectious or inflammatory process. Stable scarring over the lingula. No effusion. Mild tracheobronchomalacia unchanged from the recent prior exam from October although worse compared to January 2024. Upper Abdomen: Calcified plaque over the visualized upper abdomen. Remaining upper abdominal structures are unchanged without acute findings. Musculoskeletal: Interval worsening of a severe T6 compression fracture with mild impression on the anterior thecal sac from the posterior border of the compression fracture. New anterior wedging of T5. Review of the MIP images confirms the above findings. IMPRESSION: 1. No evidence of pulmonary embolism. 2. Chronic collapse/atelectasis over the central right middle lobe with interval development of 2 small cavitary like foci over the collapsed central right middle lobe measuring approximately 1.3 and 1.1 cm respectively. Findings may be due to an infectious or inflammatory process and less likely developing neoplastic process. This should be re-evaluated on the follow-up CT recommended below. 3. Patchy airspace process over the right upper lobe and minimally over the medial left upper lobe likely due to an infectious or inflammatory process. Recommend follow-up CT 6-8 weeks. 4. Mild tracheobronchomalacia unchanged from the recent prior exam from October, although worse compared to January 2024. 5. Interval worsening of a severe T6 compression fracture with new anterior wedging of T5. 6. Aortic  atherosclerosis. Atherosclerotic coronary artery disease. Aortic Atherosclerosis (ICD10-I70.0). Electronically Signed   By: Toribio Agreste M.D.   On: 09/30/2024 10:59   DG Chest Port 1 View Result Date: 09/29/2024 EXAM: 1 VIEW(S) XRAY OF THE CHEST 09/29/2024 12:11:26 AM COMPARISON: 09/17/2024 CLINICAL HISTORY: SHOB COPD SHOB COPD FINDINGS: LUNGS AND PLEURA: Mild hyperinflation. Minimal residual linear densities at the left base could reflect residual atelectasis or scarring. No focal pulmonary opacity. No pulmonary edema. No pleural effusion. No pneumothorax. HEART AND MEDIASTINUM: Aortic atherosclerosis. No acute abnormality of the cardiac silhouette. BONES AND SOFT TISSUES: No acute osseous abnormality. IMPRESSION: 1. Mild hyperinflation consistent with COPD. 2. Electronically signed by: Franky Crease MD 09/29/2024 12:19 AM EST RP Workstation: [id number]   DG Chest Port 1 View Result Date: 09/17/2024 EXAM: 1 VIEW(S) XRAY OF THE CHEST 09/17/2024 03:46:00 PM COMPARISON: 12/22/2023 CLINICAL HISTORY: S/P bronchoscopy [id number]. Status post bronch. FINDINGS: LUNGS AND PLEURA: Increased patchy opacities at left lung base. No pulmonary edema. No pleural effusion. No pneumothorax. HEART AND MEDIASTINUM: Aortic atherosclerosis. No  acute abnormality of the cardiac and mediastinal silhouettes. BONES AND SOFT TISSUES: No acute osseous abnormality. IMPRESSION: 1. Increased patchy opacities at the left lung base, suspicious for infection or aspiration pneumonia. 2. No pneumothorax identified post bronchoscopy. Electronically signed by: Waddell Calk MD 09/17/2024 04:15 PM EDT RP Workstation: [id number]   CT Angio Chest Pulmonary Embolism (PE) W or WO Contrast Result Date: 09/06/2024 EXAM: CTA of the Chest with contrast for PE 09/06/2024 05:58:34 PM TECHNIQUE: CTA of the chest was performed without and with the administration of 75 mL of iohexol  (OMNIPAQUE ) 350 MG/ML injection. Multiplanar reformatted images are provided  for review. MIP images are provided for review. Automated exposure control, iterative reconstruction, and/or weight based adjustment of the mA/kV was utilized to reduce the radiation dose to as low as reasonably achievable. COMPARISON: CT chest without contrast 1524. CT of the chest 621 2016. CLINICAL HISTORY: Acute chest pain R07.9 (ICD-10-CM); Positive D-dimer R79.89 (ICD-10-CM) FINDINGS: PULMONARY ARTERIES: Pulmonary arteries are adequately opacified for evaluation. No pulmonary embolism. Main pulmonary artery is normal in caliber. MEDIASTINUM: The heart and pericardium demonstrate no acute abnormality. Mild atherosclerotic calcifications of the aorta. LYMPH NODES: No mediastinal, hilar or axillary lymphadenopathy. LUNGS AND PLEURA: Mild emphysema is present. There is stable atelectasis in the lingula. There is complete collapse of the right middle lobe unchanged from the prior study. No focal consolidation or pulmonary edema. No pleural effusion or pneumothorax. UPPER ABDOMEN: Limited images of the upper abdomen are unremarkable. SOFT TISSUES AND BONES: No acute bone or soft tissue abnormality. IMPRESSION: 1. No evidence of pulmonary embolism. 2. Unchanged Complete collapse of the right middle lobe. This is of uncertain etiology. Central obstructing lesion is not excluded. Recommend clinical correlation and follow up. 3. Stable atelectasis in the lingula. 4. Mild emphysema. 5. Aortic atherosclerosis. Electronically signed by: Greig Pique MD 09/06/2024 06:33 PM EDT RP Workstation: [id number]    Microbiology: Results for orders placed or performed during the hospital encounter of 09/28/24  Resp panel by RT-PCR (RSV, Flu A&B, Covid) Anterior Nasal Swab     Status: None   Collection Time: 09/28/24 11:52 PM   Specimen: Anterior Nasal Swab  Result Value Ref Range Status   SARS Coronavirus 2 by RT PCR NEGATIVE NEGATIVE Final    Comment: (NOTE) SARS-CoV-2 target nucleic acids are NOT DETECTED.  The  SARS-CoV-2 RNA is generally detectable in upper respiratory specimens during the acute phase of infection. The lowest concentration of SARS-CoV-2 viral copies this assay can detect is 138 copies/mL. A negative result does not preclude SARS-Cov-2 infection and should not be used as the sole basis for treatment or other patient management decisions. A negative result may occur with  improper specimen collection/handling, submission of specimen other than nasopharyngeal swab, presence of viral mutation(s) within the areas targeted by this assay, and inadequate number of viral copies(<138 copies/mL). A negative result must be combined with clinical observations, patient history, and epidemiological information. The expected result is Negative.  Fact Sheet for Patients:  bloggercourse.com  Fact Sheet for Healthcare Providers:  seriousbroker.it  This test is no t yet approved or cleared by the [place]  FDA and  has been authorized for detection and/or diagnosis of SARS-CoV-2 by FDA under an Emergency Use Authorization (EUA). This EUA will remain  in effect (meaning this test can be used) for the duration of the COVID-19 declaration under Section 564(b)(1) of the Act, 21 U.S.C.section 360bbb-3(b)(1), unless the authorization is terminated  or revoked sooner.  Influenza A by PCR NEGATIVE NEGATIVE Final   Influenza B by PCR NEGATIVE NEGATIVE Final    Comment: (NOTE) The Xpert Xpress SARS-CoV-2/FLU/RSV plus assay is intended as an aid in the diagnosis of influenza from Nasopharyngeal swab specimens and should not be used as a sole basis for treatment. Nasal washings and aspirates are unacceptable for Xpert Xpress SARS-CoV-2/FLU/RSV testing.  Fact Sheet for Patients: bloggercourse.com  Fact Sheet for Healthcare Providers: seriousbroker.it  This test is not yet approved or  cleared by the [place]  FDA and has been authorized for detection and/or diagnosis of SARS-CoV-2 by FDA under an Emergency Use Authorization (EUA). This EUA will remain in effect (meaning this test can be used) for the duration of the COVID-19 declaration under Section 564(b)(1) of the Act, 21 U.S.C. section 360bbb-3(b)(1), unless the authorization is terminated or revoked.     Resp Syncytial Virus by PCR NEGATIVE NEGATIVE Final    Comment: (NOTE) Fact Sheet for Patients: bloggercourse.com  Fact Sheet for Healthcare Providers: seriousbroker.it  This test is not yet approved or cleared by the [place]  FDA and has been authorized for detection and/or diagnosis of SARS-CoV-2 by FDA under an Emergency Use Authorization (EUA). This EUA will remain in effect (meaning this test can be used) for the duration of the COVID-19 declaration under Section 564(b)(1) of the Act, 21 U.S.C. section 360bbb-3(b)(1), unless the authorization is terminated or revoked.  Performed at [facility], [address] [place], [place] [address]   Respiratory (~20 pathogens) panel by PCR     Status: None   Collection Time: 09/29/24  9:31 AM   Specimen: Nasopharyngeal Swab; Respiratory  Result Value Ref Range Status   Adenovirus NOT DETECTED NOT DETECTED Final   Coronavirus 229E NOT DETECTED NOT DETECTED Final    Comment: (NOTE) The Coronavirus on the Respiratory Panel, DOES NOT test for the novel  Coronavirus (2019 nCoV)    Coronavirus HKU1 NOT DETECTED NOT DETECTED Final   Coronavirus NL63 NOT DETECTED NOT DETECTED Final   Coronavirus OC43 NOT DETECTED NOT DETECTED Final   Metapneumovirus NOT DETECTED NOT DETECTED Final   Rhinovirus / Enterovirus NOT DETECTED NOT DETECTED Final   Influenza A NOT DETECTED NOT DETECTED Final   Influenza B NOT DETECTED NOT DETECTED Final   Parainfluenza Virus 1 NOT DETECTED NOT DETECTED Final    Parainfluenza Virus 2 NOT DETECTED NOT DETECTED Final   Parainfluenza Virus 3 NOT DETECTED NOT DETECTED Final   Parainfluenza Virus 4 NOT DETECTED NOT DETECTED Final   Respiratory Syncytial Virus NOT DETECTED NOT DETECTED Final   Bordetella pertussis NOT DETECTED NOT DETECTED Final   Bordetella Parapertussis NOT DETECTED NOT DETECTED Final   Chlamydophila pneumoniae NOT DETECTED NOT DETECTED Final   Mycoplasma pneumoniae NOT DETECTED NOT DETECTED Final    Comment: Performed at [facility], [address], [place], [place] [address]    Labs: CBC: Recent Labs  Lab 09/28/24 2347 09/29/24 0610  WBC 14.8* 10.9*  NEUTROABS 8.9*  --   HGB 12.1 10.9*  HCT 38.2 35.2*  MCV 87.0 87.3  PLT 463* 388   Basic Metabolic Panel: Recent Labs  Lab 09/28/24 2347 09/29/24 0610 09/30/24 0552 10/01/24 0524 10/02/24 0543  NA 140 139 138 141 139  K 2.9* 3.8 3.1* 3.7 4.0  CL 91* 95* 94* 101 102  CO2 35* 34* 33* 32 26  GLUCOSE 188* 182* 115* 104* 142*  BUN 23 21 29* 27* 25*  CREATININE 1.11* 0.77 0.78  0.68 0.68  CALCIUM 9.0 8.7* 9.1 8.9 8.9  MG 3.9*  --  2.0  --   --   PHOS 4.2  --   --   --   --    Liver Function Tests: No results for input(s): AST, ALT, ALKPHOS, BILITOT, PROT, ALBUMIN in the last 168 hours. CBG: No results for input(s): GLUCAP in the last 168 hours.  Discharge time spent: greater than 30 minutes.  This record has been created using Conservation officer, historic buildings. Errors have been sought and corrected,but may not always be located. Such creation errors do not reflect on the standard of care.   Signed: Amaryllis Dare, MD [facility] 10/02/2024

## 2024-10-02 NOTE — Progress Notes (Signed)
 PULMONOLOGY         Date: 10/02/2024,   MRN# [id number] Rachael Blankenship 1956-05-24     AdmissionWeight: 64.3 kg                 CurrentWeight: 65.6 kg  Referring provider: Dr Kandis   CHIEF COMPLAINT:   Acute exacerbation of COPD   HISTORY OF PRESENT ILLNESS   This is a 68 year old female with a lifelong smoking history, COPD history of COVID-19 chronic hypoxemia requiring supplemental oxygen  recurrent mucous plugging status post bronchoscopy with therapeutic aspiration 1 month ago and continued chest discomfort.  She has been coughing heavy phlegm over the past year or so with seasonal changes and has improved after initiation of biologic Dupixent reporting resolution of phlegm upon expectoration.  Despite this she still continues to have acute exacerbations requiring IV steroids and antimicrobials.  She came in this time with complaints of acute onset of worsening shortness of breath and shallow breathing.  Her chest imaging does show atelectatic changes similar to prior.  She has had right middle lobe atelectasis multiple times and again has similar findings, additionally CT chest PE protocol on this admission negative for PE but does shows left upper lobe and right upper lobe airspace disease either inflammatory or infectious in etiology.  Incidental findings of worsening severe T6 compression fracture.  She is improved during my eval post IV steroids and reports partial relief of pain however not close to baseline as she was 6 months ago.   10/01/24-patient seen at bedside with husband present. She's able to speak in full sentences on 2-3L/min [place].  Her breathing is improved getting closer to baseline. There is a new problem which is worse and is known to hinder breathing which is a severe T6 fracture and associated kyphosis. This leads to restricted ventilatory defect.   10/02/24- patient resting in bed comfortably. She is close to baseline.  She will be seen by neurosurgery on  outpatient they were not able to come see her.  Her RVP is negative , legionella and strep pneumo negative.  She is close to baseline. I will repeat CXR and plan to DC home possibly today.   PAST MEDICAL HISTORY   Past Medical History:  Diagnosis Date   Bruises easily    Chronic respiratory failure with hypoxia (HCC)    Cigarette smoker    Collapse of right lung    COPD (chronic obstructive pulmonary disease) (HCC)    COVID-19 07/2024   Dependence on supplemental oxygen     Depression with anxiety    Endometriosis    GERD (gastroesophageal reflux disease)    Grade I diastolic dysfunction    History of sepsis    Hyperlipidemia    Hypertension 06/02/2012   Kidney stones    Pulmonary HTN (HCC)    Reactive airway disease 03/08/2014   Vitamin D  deficiency      SURGICAL HISTORY   Past Surgical History:  Procedure Laterality Date   BRONCHIAL WASHINGS N/A 09/17/2024   Procedure: IRRIGATION, BRONCHUS;  Surgeon: Parris Manna, MD;  Location: [facility];  Service: Thoracic;  Laterality: N/A;   CATARACT EXTRACTION W/PHACO Right 12/04/2023   Procedure: CATARACT EXTRACTION PHACO AND INTRAOCULAR LENS PLACEMENT (IOC) RIGHT  CLAREON VIVITY LENS;  Surgeon: Enola [name], MD;  Location: [facility];  Service: Ophthalmology;  Laterality: Right;  10.40 1:04.0   CATARACT EXTRACTION W/PHACO Left 12/18/2023   Procedure: CATARACT EXTRACTION PHACO AND INTRAOCULAR LENS PLACEMENT (IOC) LEFT  CLAREON VIVITY TORIC 16.28, 01:19.2;  Surgeon: Enola [name], MD;  Location: [facility];  Service: Ophthalmology;  Laterality: Left;   COLONOSCOPY WITH PROPOFOL  N/A 01/02/2023   Procedure: COLONOSCOPY WITH PROPOFOL ;  Surgeon: Unk Corinn Skiff, MD;  Location: [facility];  Service: Gastroenterology;  Laterality: N/A;   ESOPHAGOGASTRODUODENOSCOPY N/A 07/29/2018   Procedure: ESOPHAGOGASTRODUODENOSCOPY (EGD);  Surgeon: Toledo, Ladell POUR, MD;  Location: [facility];  Service:  Gastroenterology;  Laterality: N/A;   ESOPHAGOGASTRODUODENOSCOPY (EGD) WITH PROPOFOL  N/A 01/02/2023   Procedure: ESOPHAGOGASTRODUODENOSCOPY (EGD) WITH PROPOFOL ;  Surgeon: Unk Corinn Skiff, MD;  Location: [facility];  Service: Gastroenterology;  Laterality: N/A;   FLEXIBLE BRONCHOSCOPY N/A 09/17/2024   Procedure: BRONCHOSCOPY, FLEXIBLE;  Surgeon: Parris Manna, MD;  Location: [facility];  Service: Thoracic;  Laterality: N/A;   KIDNEY STONE SURGERY     RIGHT HEART CATH Right 04/21/2024   Procedure: RIGHT HEART CATH;  Surgeon: Ammon Blunt, MD;  Location: [facility];  Service: Cardiovascular;  Laterality: Right;   TONSILLECTOMY       FAMILY HISTORY   Family History  Problem Relation Age of Onset   Hypertension Mother    Hyperlipidemia Mother    GER disease Mother    Heart attack Father        23 yrs ago   Hypertension Father    Parkinson's disease Father    GER disease Father      SOCIAL HISTORY   Social History   Tobacco Use   Smoking status: Former    Current packs/day: 0.00    Average packs/day: 0.5 packs/day for 50.1 years (26.8 ttl pk-yrs)    Types: Cigarettes    Start date: 41    Quit date: 12/18/2023    Years since quitting: 0.7   Smokeless tobacco: Never  Vaping Use   Vaping status: Former  Substance Use Topics   Alcohol use: Yes    Comment: ocassionally    Drug use: No     MEDICATIONS    Home Medication:    Current Medication:  Current Facility-Administered Medications:    acetaminophen  (TYLENOL ) tablet 650 mg, 650 mg, Oral, Q6H PRN, Niu, Xilin, MD   albuterol  (PROVENTIL ) (2.5 MG/3ML) 0.083% nebulizer solution 2.5 mg, 2.5 mg, Nebulization, Q2H PRN, Wouk, Devaughn Sayres, MD, 2.5 mg at 09/29/24 9090   ALPRAZolam  (XANAX ) tablet 0.25 mg, 0.25 mg, Oral, TID PRN, Niu, Xilin, MD   buPROPion  (WELLBUTRIN ) tablet 75 mg, 75 mg, Oral, BID, Niu, Xilin, MD, 75 mg at 10/01/24 2136   dextromethorphan-guaiFENesin (MUCINEX DM) 30-600 MG per 12 hr  tablet 1 tablet, 1 tablet, Oral, BID PRN, Niu, Xilin, MD   DULoxetine  (CYMBALTA ) DR capsule 60 mg, 60 mg, Oral, Landrum Exon, Xilin, MD, 60 mg at 10/01/24 9394   enoxaparin  (LOVENOX ) injection 40 mg, 40 mg, Subcutaneous, Q24H, Niu, Xilin, MD, 40 mg at 10/01/24 0825   famotidine  (PEPCID ) tablet 40 mg, 40 mg, Oral, BID, Niu, Xilin, MD, 40 mg at 10/01/24 2137   feeding supplement (ENSURE PLUS HIGH PROTEIN) liquid 237 mL, 237 mL, Oral, BID BM, Niu, Xilin, MD, 237 mL at 10/01/24 1514   gemfibrozil  (LOPID ) tablet 600 mg, 600 mg, Oral, BID AC, Niu, Xilin, MD, 600 mg at 10/01/24 2137   hydrALAZINE  (APRESOLINE ) injection 5 mg, 5 mg, Intravenous, Q2H PRN, Niu, Xilin, MD   ipratropium-albuterol  (DUONEB) 0.5-2.5 (3) MG/3ML nebulizer solution 3 mL, 3 mL, Nebulization, TID, Wouk, Devaughn Sayres, MD, 3 mL at 10/02/24 0736   levofloxacin  (LEVAQUIN ) tablet 750 mg,  750 mg, Oral, Daily, Liberty Seto, MD   methylPREDNISolone  sodium succinate (SOLU-MEDROL ) 40 mg/mL injection 40 mg, 40 mg, Intravenous, Q12H, Keriann Rankin, MD, 40 mg at 10/01/24 2137   metoprolol  tartrate (LOPRESSOR ) tablet 12.5 mg, 12.5 mg, Oral, BID, Wouk, Devaughn Sayres, MD, 12.5 mg at 10/01/24 2137   multivitamin with minerals tablet 1 tablet, 1 tablet, Oral, Daily, Niu, Xilin, MD, 1 tablet at 10/01/24 0825   ondansetron  (ZOFRAN ) injection 4 mg, 4 mg, Intravenous, Q8H PRN, Niu, Xilin, MD   Oral care mouth rinse, 15 mL, Mouth Rinse, 4 times per day, Wouk, Devaughn Sayres, MD, 15 mL at 10/01/24 2137   Oral care mouth rinse, 15 mL, Mouth Rinse, PRN, Wouk, Devaughn Sayres, MD   piperacillin-tazobactam (ZOSYN) IVPB 3.375 g, 3.375 g, Intravenous, Q8H, Hallaji, Sheema M, RPH, Last Rate: 12.5 mL/hr at 10/02/24 0304, 3.375 g at 10/02/24 0304   sucralfate  (CARAFATE ) tablet 1 g, 1 g, Oral, QID, Niu, Xilin, MD, 1 g at 10/01/24 2137    ALLERGIES   Naproxen and Sulfa antibiotics     REVIEW OF SYSTEMS    Review of Systems:  Gen:  Denies  fever, sweats,  chills weigh loss  HEENT: Denies blurred vision, double vision, ear pain, eye pain, hearing loss, nose bleeds, sore throat Cardiac:  No dizziness, chest pain or heaviness, chest tightness,edema Resp:   reports dyspnea chronically  Gi: Denies swallowing difficulty, stomach pain, nausea or vomiting, diarrhea, constipation, bowel incontinence Gu:  Denies bladder incontinence, burning urine Ext:   Denies Joint pain, stiffness or swelling Skin: Denies  skin rash, easy bruising or bleeding or hives Endoc:  Denies polyuria, polydipsia , polyphagia or weight change Psych:   Denies depression, insomnia or hallucinations   Other:  All other systems negative   VS: BP (!) 178/85 (BP Location: Right Arm)   Pulse 71   Temp 98.2 F (36.8 C)   Resp 18   Ht 5' 3 (1.6 m)   Wt 65.6 kg   SpO2 97%   BMI 25.62 kg/m      PHYSICAL EXAM    GENERAL:NAD, no fevers, chills, no weakness no fatigue HEAD: Normocephalic, atraumatic.  EYES: Pupils equal, round, reactive to light. Extraocular muscles intact. No scleral icterus.  MOUTH: Moist mucosal membrane. Dentition intact. No abscess noted.  EAR, NOSE, THROAT: Clear without exudates. No external lesions.  NECK: Supple. No thyromegaly. No nodules. No JVD.  PULMONARY: decreased breath sounds with mild rhonchi worse at bases bilaterally.  CARDIOVASCULAR: S1 and S2. Regular rate and rhythm. No murmurs, rubs, or gallops. No edema. Pedal pulses 2+ bilaterally.  GASTROINTESTINAL: Soft, nontender, nondistended. No masses. Positive bowel sounds. No hepatosplenomegaly.  MUSCULOSKELETAL: No swelling, clubbing, or edema. Range of motion full in all extremities.  NEUROLOGIC: Cranial nerves II through XII are intact. No gross focal neurological deficits. Sensation intact. Reflexes intact.  SKIN: No ulceration, lesions, rashes, or cyanosis. Skin warm and dry. Turgor intact.  PSYCHIATRIC: Mood, affect within normal limits. The patient is awake, alert and oriented x 3.  Insight, judgment intact.       IMAGING     Narrative & Impression CLINICAL DATA:  Elevated D-dimer with dyspnea. Evaluate for pulmonary embolism.   EXAM: CT ANGIOGRAPHY CHEST WITH CONTRAST   TECHNIQUE: Multidetector CT imaging of the chest was performed using the standard protocol during bolus administration of intravenous contrast. Multiplanar CT image reconstructions and MIPs were obtained to evaluate the vascular anatomy.   RADIATION DOSE REDUCTION: This exam was performed  according to the departmental dose-optimization program which includes automated exposure control, adjustment of the mA and/or kV according to patient size and/or use of iterative reconstruction technique.   CONTRAST:  75mL OMNIPAQUE  IOHEXOL  350 MG/ML SOLN   COMPARISON:  09/06/2024 and 11/29/2022   FINDINGS: Cardiovascular: Heart is normal size. There is calcified plaque over the left anterior descending coronary artery. Thoracic aorta is normal in caliber. Mild calcified plaque over the descending thoracic aorta. Pulmonary arterial system is well opacified without evidence of emboli. Remaining vascular structures are unremarkable.   Mediastinum/Nodes: No evidence of mediastinal or hilar adenopathy. Remaining mediastinal structures are unremarkable.   Lungs/Pleura: Lungs are adequately inflated. There is chronic collapse/atelectasis over the central right middle lobe with interval development of 2 small cavitary like foci over the collapsed central right middle lobe measuring approximately 1.3 and 1.1 cm respectively. There is a patchy airspace process over the right upper lobe and minimally over the medial left upper lobe likely due to an infectious or inflammatory process. Stable scarring over the lingula. No effusion. Mild tracheobronchomalacia unchanged from the recent prior exam from October although worse compared to January 2024.   Upper Abdomen: Calcified plaque over the visualized  upper abdomen. Remaining upper abdominal structures are unchanged without acute findings.   Musculoskeletal: Interval worsening of a severe T6 compression fracture with mild impression on the anterior thecal sac from the posterior border of the compression fracture. New anterior wedging of T5.   Review of the MIP images confirms the above findings.   IMPRESSION: 1. No evidence of pulmonary embolism. 2. Chronic collapse/atelectasis over the central right middle lobe with interval development of 2 small cavitary like foci over the collapsed central right middle lobe measuring approximately 1.3 and 1.1 cm respectively. Findings may be due to an infectious or inflammatory process and less likely developing neoplastic process. This should be re-evaluated on the follow-up CT recommended below. 3. Patchy airspace process over the right upper lobe and minimally over the medial left upper lobe likely due to an infectious or inflammatory process. Recommend follow-up CT 6-8 weeks. 4. Mild tracheobronchomalacia unchanged from the recent prior exam from October, although worse compared to January 2024. 5. Interval worsening of a severe T6 compression fracture with new anterior wedging of T5. 6. Aortic atherosclerosis. Atherosclerotic coronary artery disease.   Aortic Atherosclerosis (ICD10-I70.0).     Electronically Signed   By: Toribio Agreste M.D.   On: 09/30/2024 10:59 ASSESSMENT/PLAN   Acute on chronic hypoxemic respiratory failure     Due to AECOPD with pneumonia     - new infiltrates with cavitary component bilaterally worse at RML     - possible pseudomonas will obtain cultures and empirically cover with Zosyn     - continue steroids for AECOPD    - Flutter valve for mucus plugging and IS for chest PT      Community acquired pneumonia- RESOLVED       WILL narrow her regimen to Levofloxacin  PO in prep for dc home     Reducing steroids , currently from 80 to 40 bid and will taper  to PO prednisone      -please hold her dose of dupixent which is due this week     -Viral Triplet and RVP is negative     -sputum cultures in process  Complete atelectasis of RML      - patient would benefit from chest PT, please do NOT use VEST therapy due to severe spinal fracture       -  favor using metaneb q8h    Severe worsening of T6 compression fracture        - have reached out to NSGY for additional guidance         - patient has been complaining of pain for past 6 months        - we have treated her previosly with low dose narcotics but discontinued this due to dependence potential.  She has been compliant but suffers with pain      Thank you for allowing me to participate in the care of this patient.   Patient/Family are satisfied with care plan and all questions have been answered.    Provider disclosure: Patient with at least one acute or chronic illness or injury that poses a threat to life or bodily function and is being managed actively during this encounter.  All of the below services have been performed independently by signing provider:  review of prior documentation from internal and or external health records.  Review of previous and current lab results.  Interview and comprehensive assessment during patient visit today. Review of current and previous chest radiographs/CT scans. Discussion of management and test interpretation with health care team and patient/family.   This document was prepared using Dragon voice recognition software and may include unintentional dictation errors.     Marquet Faircloth, M.D.  Division of Pulmonary & Critical Care Medicine

## 2024-10-02 NOTE — Progress Notes (Signed)
 Neurosurgery visit note Patient seen and examined.  She denies any shortness of breath though is breathing intermittently through pursed lips otherwise stable.  She is up eating breakfast denies any new pain though notes longstanding midthoracic back pain.  Physical exam she is awake fluent and appropriate cranial nerves are grossly intact she moves all 4 extremities grossly full strength and sensation.  MRI imaging as well as CT scan imaging had been previously reviewed and had been notable for a IMPRESSION: 1. Severe T6 compression fracture with 75% vertebral body height loss, sclerosis, and edema. Retropulsion results in mild to moderate spinal stenosis and severe bilateral T6 neural foraminal stenosis. 2. T5 inferior endplate compression fracture with 30% anterior vertebral body height loss and moderate marrow edema, new from 09/06/2024.   AP: The patient has thoracic compression fractures with increasing kyphotic deformity in the area which progressed from the last month.  The current plan is for the patient to be seen for potential kyphoplasty on 11/11 and the neurosurgery team will arrange this follow-up. Continue TLSO brace when out of bed and mobilizing pain control and please call with questions  Belvie PARAS. Deatrice, MD Neurosurgery

## 2024-10-02 NOTE — Evaluation (Signed)
 Occupational Therapy Evaluation Patient Details Name: Rachael Blankenship MRN: [id number] DOB: 06-06-1956 Today's Date: 10/02/2024   History of Present Illness   Rachael Blankenship is a 68yoF who comes to [place] with continued severe SOB ongoing since she had COVID PNA in September. PMH:  COPD on 2L O2, former smoker, HTN, HLD, dCHF, pulmonary HTN, GERD, depression with anxiety. 09/17/24 bronchoscopy OP pulmonology for therapeutic aspiration of tracheobronchial tree and bronchial alveolar lavage due to ongoing COVID symptoms.     Clinical Impressions Pt seen for OT evaluation this date.  Pt lives at home with her husband, has a daughter in law who comes to the home 5 days a week to assist with home management tasks.  Her husband helps with cooking.  She presents with shortness of breath, muscle weakness, mild balance deficits, decreased functional mobility/transfers and decreased ability to perform self care tasks.  She would benefit from skilled OT services to maximize safety and independence in necessary daily tasks.       If plan is discharge home, recommend the following:   A little help with walking and/or transfers;A little help with bathing/dressing/bathroom;Assistance with cooking/housework;Assist for transportation     Functional Status Assessment   Patient has had a recent decline in their functional status and demonstrates the ability to make significant improvements in function in a reasonable and predictable amount of time.     Equipment Recommendations         Recommendations for Other Services         Precautions/Restrictions   Precautions Precautions: Fall Restrictions Weight Bearing Restrictions Per Provider Order: No     Mobility Bed Mobility Overal bed mobility: Modified Independent                  Transfers Overall transfer level: Modified independent                        Balance Overall balance assessment: Mild deficits observed,  not formally tested                                         ADL either performed or assessed with clinical judgement   ADL Overall ADL's : Needs assistance/impaired Eating/Feeding: Modified independent   Grooming: Modified independent   Upper Body Bathing: Set up   Lower Body Bathing: Minimal assistance   Upper Body Dressing : Modified independent   Lower Body Dressing: Minimal assistance   Toilet Transfer: Contact guard assist   Toileting- Clothing Manipulation and Hygiene: Contact guard assist       Functional mobility during ADLs: Contact guard assist       Vision Baseline Vision/History: 1 Wears glasses       Perception         Praxis         Pertinent Vitals/Pain Pain Assessment Pain Assessment: No/denies pain Pain Score: 0-No pain     Extremity/Trunk Assessment Upper Extremity Assessment Upper Extremity Assessment: Generalized weakness;Left hand dominant   Lower Extremity Assessment Lower Extremity Assessment: Defer to PT evaluation       Communication     Cognition Arousal: Alert Behavior During Therapy: [facility] for tasks assessed/performed  Cueing  General Comments          Exercises     Shoulder Instructions      Home Living Family/patient expects to be discharged to:: Private residence Living Arrangements: Spouse/significant other Available Help at Discharge: Family Type of Home: House Home Access: Stairs to enter [employer] of Steps: 5 Entrance Stairs-Rails: Left;Right Home Layout: Two level;Laundry or work area in basement;Able to live on main level with bedroom/bathroom     Bathroom Shower/Tub: Producer, Television/film/video: Handicapped height     Home Equipment: [place] - single point;Shower seat          Prior Functioning/Environment Prior Level of Function : Independent/Modified Independent             Mobility Comments:  Pt reports she is independent with mobility at home without an assistive device, has access to a rollator.  She has 2 steps to go down to the den. ADLs Comments: Pt reports she was independent with basic self care tasks at home, independent with medication management.  She is able to perform light meal prep but has assistance with cleaning and cooking.  Her step daughter comes 5 days a week to assist with household chores.  She reports she is able to drive but rarely goes out by herself since its difficult to manage her oxygen .    OT Problem List: Decreased strength;Decreased activity tolerance;Decreased knowledge of use of DME or AE;Impaired balance (sitting and/or standing)   OT Treatment/Interventions:        OT Goals(Current goals can be found in the care plan section)   Acute Rehab OT Goals Patient Stated Goal: Pt would like to be as independent as possible and return home with family OT Goal Formulation: With patient Time For Goal Achievement: 10/16/24 Potential to Achieve Goals: Good ADL Goals Pt Will Perform Lower Body Bathing: with modified independence Pt Will Perform Lower Body Dressing: with modified independence Pt Will Transfer to Toilet: with modified independence   OT Frequency:  Min 2X/week    Co-evaluation              AM-PAC OT 6 Clicks Daily Activity     Outcome Measure Help from another person eating meals?: None Help from another person taking care of personal grooming?: None Help from another person toileting, which includes using toliet, bedpan, or urinal?: A Little Help from another person bathing (including washing, rinsing, drying)?: A Little Help from another person to put on and taking off regular upper body clothing?: None Help from another person to put on and taking off regular lower body clothing?: A Little 6 Click Score: 21   End of Session    Activity Tolerance: Patient tolerated treatment well Patient left: in chair;with  family/visitor present  OT Visit Diagnosis: Muscle weakness (generalized) (M62.81);Unsteadiness on feet (R26.81)                Time: 8884-8861 OT Time Calculation (min): 23 min Charges:  OT General Charges $OT Visit: 1 Visit OT Evaluation $OT Eval Low Complexity: 1 Low OT Treatments $Self Care/Home Management : 8-22 mins  Lamyia Cdebaca T Hiroto Saltzman, OTR/L, CLT  Rahim Astorga 10/02/2024, 1:49 PM

## 2024-10-02 NOTE — TOC Progression Note (Signed)
 Transition of Care [facility]) - Progression Note    Patient Details  Name: Rachael Blankenship MRN: [id number] Date of Birth: [birth year]  Transition of Care [facility]) CM/SW Contact  Marinda Cooks, RN Phone Number: 10/02/2024, 4:26 PM  Clinical Narrative:    This CM alerted by MD of pt's HH order . This CM spoke with pt introduced role and discussed DC planning and HH recommendations. Pt offered choice and confirmed she did not have agency preference. This CM spoke with [place] confirmed [facility] has been secured with [facility]. TOC will cont to follow DC planning and update as applicable.     Barriers to Discharge: No Barriers Identified  Expected Discharge Plan and Services     Expected Discharge Date: 10/02/24                   HH Arranged: PT, OT HH Agency: [facility] Date HH Agency Contacted: 10/02/24 Time HH Agency Contacted: 1624 Representative spoke with at [facility] Agency: [facility]   Social Drivers of Health (SDOH) Interventions SDOH Screenings   Food Insecurity: No Food Insecurity (09/29/2024)  Housing: Low Risk  (09/29/2024)  Transportation Needs: No Transportation Needs (09/29/2024)  Utilities: Not At Risk (09/29/2024)  Alcohol Screen: Low Risk  (06/24/2022)  Depression (PHQ2-9): Low Risk  (03/24/2023)  Financial Resource Strain: Low Risk  (03/31/2024)   Received from [facility]  Social Connections: Moderately Integrated (09/29/2024)  Tobacco Use: Medium Risk (09/29/2024)    Readmission Risk Interventions     No data to display

## 2024-10-04 NOTE — Progress Notes (Unsigned)
 Referring Physician:  Fernande Ophelia JINNY DOUGLAS, MD [address] [facility] [place],  [place] [address]  Primary Physician:  Fernande Ophelia JINNY DOUGLAS, MD  History of Present Illness: 10/05/2024 Ms. Rachael Blankenship has a history of pulmonary HTN, HTN, CHF, COPD, GERD, osteoporosis, GAD, hyperlipidemia, depression.   Seen in ED on 09/28/24 for COPD exacerbation and was found to have severe T6 compression fracture along with endplate compression of T5. She was to continue with TLSO brace when out of bed and he discussed possible kyphoplasty with her.   She is on chronic O2 at 2L.   She is here for follow up.   1-2 year history of constant mid back pain that has been worse in last 6 months. She has intermittent pain that shoots through into sternum. No radiation of pain to her ribs. She has weakness in her back when pain is severe. She intermittent tingling in left hand. No arm or leg pain. No numbness, tingling in her legs. Some recent weakness in her legs. Pain is worse with moving and better with heat. No known injury noted.   She was not given a brace in the hospital.   Tobacco use: Does not smoke.   Bowel/Bladder Dysfunction: none, urinary urgency- she wears a pad. Some bowel urgency as well.   Conservative measures:  Physical therapy: has not participated in Multimodal medical therapy including regular antiinflammatories:  Cymbalta , Ibuprofen Injections:  no epidural steroid injections  Past Surgery: no spine surgery  Rachael Blankenship has no symptoms of cervical myelopathy.  The symptoms are causing a significant impact on the patient's life.   Review of Systems:  A 10 point review of systems is negative, except for the pertinent positives and negatives detailed in the HPI.  Past Medical History: Past Medical History:  Diagnosis Date   Bruises easily    Chronic respiratory failure with hypoxia (HCC)    Cigarette smoker    Collapse of right lung    COPD (chronic  obstructive pulmonary disease) (HCC)    COVID-19 07/2024   Dependence on supplemental oxygen     Depression with anxiety    Endometriosis    GERD (gastroesophageal reflux disease)    Grade I diastolic dysfunction    History of sepsis    Hyperlipidemia    Hypertension 06/02/2012   Kidney stones    Pulmonary HTN (HCC)    Reactive airway disease 03/08/2014   Vitamin D  deficiency     Past Surgical History: Past Surgical History:  Procedure Laterality Date   BRONCHIAL WASHINGS N/A 09/17/2024   Procedure: IRRIGATION, BRONCHUS;  Surgeon: Parris Manna, MD;  Location: [facility] ORS;  Service: Thoracic;  Laterality: N/A;   CATARACT EXTRACTION W/PHACO Right 12/04/2023   Procedure: CATARACT EXTRACTION PHACO AND INTRAOCULAR LENS PLACEMENT (IOC) RIGHT  CLAREON VIVITY LENS;  Surgeon: Enola [name], MD;  Location: [facility];  Service: Ophthalmology;  Laterality: Right;  10.40 1:04.0   CATARACT EXTRACTION W/PHACO Left 12/18/2023   Procedure: CATARACT EXTRACTION PHACO AND INTRAOCULAR LENS PLACEMENT (IOC) LEFT  CLAREON VIVITY TORIC 16.28, 01:19.2;  Surgeon: Enola [name], MD;  Location: [facility];  Service: Ophthalmology;  Laterality: Left;   COLONOSCOPY WITH PROPOFOL  N/A 01/02/2023   Procedure: COLONOSCOPY WITH PROPOFOL ;  Surgeon: Unk Corinn Skiff, MD;  Location: [facility] ENDOSCOPY;  Service: Gastroenterology;  Laterality: N/A;   ESOPHAGOGASTRODUODENOSCOPY N/A 07/29/2018   Procedure: ESOPHAGOGASTRODUODENOSCOPY (EGD);  Surgeon: Toledo, Ladell MARLA, MD;  Location: [facility] ENDOSCOPY;  Service: Gastroenterology;  Laterality: N/A;   ESOPHAGOGASTRODUODENOSCOPY (EGD) WITH PROPOFOL  N/A 01/02/2023   Procedure: ESOPHAGOGASTRODUODENOSCOPY (EGD) WITH PROPOFOL ;  Surgeon: Unk Corinn Skiff, MD;  Location: [facility] ENDOSCOPY;  Service: Gastroenterology;  Laterality: N/A;   FLEXIBLE BRONCHOSCOPY N/A 09/17/2024   Procedure: BRONCHOSCOPY, FLEXIBLE;  Surgeon: Parris Manna, MD;  Location: [facility] ORS;   Service: Thoracic;  Laterality: N/A;   KIDNEY STONE SURGERY     RIGHT HEART CATH Right 04/21/2024   Procedure: RIGHT HEART CATH;  Surgeon: Ammon Blunt, MD;  Location: [facility] INVASIVE CV LAB;  Service: Cardiovascular;  Laterality: Right;   TONSILLECTOMY      Allergies: Allergies as of 10/05/2024 - Review Complete 09/30/2024  Allergen Reaction Noted   Naproxen Itching 05/16/2015   Sulfa antibiotics Hives and Rash 03/08/2014    Medications: Outpatient Encounter Medications as of 10/05/2024  Medication Sig   ALPRAZolam  (XANAX ) 0.25 MG tablet Take 1 tablet (0.25 mg total) by mouth 3 (three) times daily as needed for anxiety.   ascorbic acid  (VITAMIN C ) 500 MG tablet Take 500 mg by mouth daily.   aspirin  81 MG tablet Take 81 mg by mouth at bedtime.    BREZTRI AEROSPHERE 160-9-4.8 MCG/ACT AERO inhaler Inhale 2 puffs into the lungs 2 (two) times daily.   buPROPion  (WELLBUTRIN ) 75 MG tablet Take 75 mg by mouth 2 (two) times daily.   Cholecalciferol  25 MCG (1000 UT) tablet Take 1,000 Units by mouth daily.    Coenzyme Q10 (CO Q-10) 100 MG CAPS Take 100 mg by mouth daily.   DENTA 5000 PLUS 1.1 % CREA dental cream Take 1 application  by mouth 2 (two) times daily as needed.   dextromethorphan-guaiFENesin (MUCINEX DM) 30-600 MG 12hr tablet Take 1 tablet by mouth 2 (two) times daily as needed for cough.   DULoxetine  (CYMBALTA ) 60 MG capsule Take 1 capsule (60 mg total) by mouth daily.   Dupilumab 300 MG/2ML SOAJ Inject 300 mg into the skin every 14 (fourteen) days.   EPINEPHrine  0.3 mg/0.3 mL IJ SOAJ injection Inject 0.3 mg into the muscle as needed.   famotidine  (PEPCID ) 40 MG tablet Take 1 tablet (40 mg total) by mouth 2 (two) times daily.   feeding supplement (ENSURE PLUS HIGH PROTEIN) LIQD Take 237 mLs by mouth 2 (two) times daily between meals.   fluticasone  (FLONASE ) 50 MCG/ACT nasal spray Place 2 sprays into both nostrils daily.   furosemide  (LASIX ) 40 MG tablet TAKE 1 TABLET BY MOUTH  DAILY   gemfibrozil  (LOPID ) 600 MG tablet Take 1 tablet (600 mg total) by mouth 2 (two) times daily before a meal.   Ipratropium-Albuterol  (COMBIVENT  RESPIMAT) 20-100 MCG/ACT AERS respimat INHALE 1 PUFF INTO THE LUNGS EVERY 6 HOURS AS NEEDED FOR WHEEZING OR SHORTNESS OF BREATH   levofloxacin  (LEVAQUIN ) 750 MG tablet Take 1 tablet (750 mg total) by mouth daily for 5 days.   Magnesium  250 MG CAPS Take 250 mg by mouth daily.   metoprolol  tartrate (LOPRESSOR ) 25 MG tablet Take 0.5 tablets (12.5 mg total) by mouth 2 (two) times daily.   Multiple Vitamins-Minerals (MULTIVITAMIN WITH MINERALS) tablet Take 1 tablet by mouth daily. Centrum Silver 50+   OXYGEN  Inhale 2 L into the lungs continuous. Continuous oxygen  and NIV at night, 2 liters   pt uses [facility] for oxygen  supplies   Potassium Chloride  CR (MICRO-K ) 8 MEQ CPCR capsule CR Take 1 capsule (8 mEq total) by mouth daily.   predniSONE  (DELTASONE ) 50 MG tablet Take 1 tablet (50 mg total) by mouth daily with  breakfast for 5 days.   sucralfate  (CARAFATE ) 1 g tablet Take 1 g by mouth 4 (four) times daily as needed (Stomach).   vitamin E  180 MG (400 UNITS) capsule Take 400 Units by mouth daily.   No facility-administered encounter medications on file as of 10/05/2024.    Social History: Social History   Tobacco Use   Smoking status: Former    Current packs/day: 0.00    Average packs/day: 0.5 packs/day for 50.1 years (26.8 ttl pk-yrs)    Types: Cigarettes    Start date: 33    Quit date: 12/18/2023    Years since quitting: 0.8   Smokeless tobacco: Never  Vaping Use   Vaping status: Former  Substance Use Topics   Alcohol use: Yes    Comment: ocassionally    Drug use: No    Family Medical History: Family History  Problem Relation Age of Onset   Hypertension Mother    Hyperlipidemia Mother    GER disease Mother    Heart attack Father        23 yrs ago   Hypertension Father    Parkinson's disease Father    GER disease Father      Physical Examination: Vitals:   10/05/24 1307  BP: 136/88    General: Patient is well developed, well nourished, calm, collected, and in no apparent distress. Attention to examination is appropriate.  Respiratory: Patient is breathing without any difficulty.   NEUROLOGICAL:     Awake, alert, oriented to person, place, and time.  Speech is clear and fluent. Fund of knowledge is appropriate.   Cranial Nerves: Pupils equal round and reactive to light.  Facial tone is symmetric.    She has thoracic tenderness around T6 area.   No abnormal lesions on exposed skin.   Strength: Side Biceps Triceps Deltoid Interossei Grip Wrist Ext. Wrist Flex.  R 5 5 5 5 5 5 5   L 5 5 5 5 5 5 5    Side Iliopsoas Quads Hamstring PF DF EHL  R 5 5 5 5 5 5   L 5 5 5 5 5 5    Reflexes are 2+ and symmetric at the biceps, brachioradialis, patella and achilles.   Hoffman's is absent.  Clonus is not present.   Bilateral upper and lower extremity sensation is intact to light touch.     No pain with IR/ER of both hips.   Gait not tested, she is in a WC.   Medical Decision Making  Imaging: Thoracic xrays dated 10/05/24:  Severe T6 compression fracture, no gross change compared to previous MRI. Also with inferior endplate deformity at T5.   Report for above images not yet available.    Thoracic MRI dated 10/01/24:  FINDINGS:   The examination is moderately motion degraded.   BONES AND ALIGNMENT: Increased upper thoracic kyphosis. No significant listhesis. T6 compression fracture with approximately 75% vertebral body height loss, sclerosis, and edema with height loss greatly progressing from 09/06/2024. T5 inferior endplate compression fracture with 30% anterior vertebral body height loss and moderate marrow edema, new from 09/06/2024. No suspicious marrow lesion.   SPINAL CORD: No definite thoracic spinal cord signal abnormality is identified; however, motion artifact severely limits  assessment in the upper thoracic spine.   SOFT TISSUES: Unremarkable.   DEGENERATIVE CHANGES: T6 retropulsion and prominent dorsal epidural fat result in moderate spinal stenosis. Retropulsion also contributes to severe bilateral T6 neural foraminal stenosis. Mild disc bulging at T11-T12 without stenosis. No neural foraminal narrowing.  IMPRESSION: 1. Severe T6 compression fracture with 75% vertebral body height loss, sclerosis, and edema. Retropulsion results in mild to moderate spinal stenosis and severe bilateral T6 neural foraminal stenosis. 2. T5 inferior endplate compression fracture with 30% anterior vertebral body height loss and moderate marrow edema, new from 09/06/2024.   Electronically signed by: Dasie Hamburg MD 10/01/2024 04:11 PM EST RP Workstation: [id number]  I have personally reviewed the images and agree with the above interpretation.  Above imaging reviewed with Dr. Clois.   Assessment and Plan: Ms. Defino is on chronic O2 at 2L. Seen in hospital for T6 compression fracture and endplate compression T5 that was found on imaging on 10/01/24.   She has a 1-2 year history of constant mid back pain that has been worse in last 6 months. She has intermittent pain that shoots through into sternum. No radiation of pain to her ribs. She has weakness in her back when pain is severe. She intermittent tingling in left hand. No arm or leg pain. No numbness, tingling in her legs.   Xrays from today show severe compression fracture T6. Previous MRI shows retropulsion with mild/moderate central stenosis.   Treatment options discussed with Dr. Clois and following plan made with patient:   - Hold on bracing as she likely would not tolerate. Would need CTO.  - No bending, twisting, or lifting.  - Continue on prn OTC tylenol  for pain. Would avoid narcotics due to respiratory issues.  - Message to IR (Dr. Karalee) to see if she would be a candidate for kyphoplasty.  - Will  message and call her when I hear back from him.   I spent a total of 45 minutes in face-to-face and non-face-to-face activities related to this patient's care today including review of outside records, review of imaging, review of symptoms, physical exam, discussion of differential diagnosis, discussion of treatment options, and documentation.   Thank you for involving me in the care of this patient.   ADDENDUM 10/07/24:  Patient reviewed with Dr. Karalee in IR. He is happy to see her to discuss kyphoplasty at T5 and T6. Patient send message and will call and let her know as well.   Glade Boys PA-C Dept. of Neurosurgery

## 2024-10-05 ENCOUNTER — Ambulatory Visit (INDEPENDENT_AMBULATORY_CARE_PROVIDER_SITE_OTHER)

## 2024-10-05 ENCOUNTER — Encounter: Payer: Self-pay | Admitting: Orthopedic Surgery

## 2024-10-05 ENCOUNTER — Ambulatory Visit (INDEPENDENT_AMBULATORY_CARE_PROVIDER_SITE_OTHER): Admitting: Orthopedic Surgery

## 2024-10-05 VITALS — BP 136/88 | Ht 63.0 in | Wt 145.0 lb

## 2024-10-05 DIAGNOSIS — S22059D Unspecified fracture of T5-T6 vertebra, subsequent encounter for fracture with routine healing: Secondary | ICD-10-CM | POA: Diagnosis not present

## 2024-10-05 DIAGNOSIS — S22050A Wedge compression fracture of T5-T6 vertebra, initial encounter for closed fracture: Secondary | ICD-10-CM

## 2024-10-05 DIAGNOSIS — S22058A Other fracture of T5-T6 vertebra, initial encounter for closed fracture: Secondary | ICD-10-CM | POA: Diagnosis not present

## 2024-10-05 DIAGNOSIS — S22050D Wedge compression fracture of T5-T6 vertebra, subsequent encounter for fracture with routine healing: Secondary | ICD-10-CM

## 2024-10-05 DIAGNOSIS — M40204 Unspecified kyphosis, thoracic region: Secondary | ICD-10-CM | POA: Diagnosis not present

## 2024-10-05 NOTE — Patient Instructions (Signed)
 It was so nice to see you today. Thank you so much for coming in.    You have a compression fracture at T6. I think this is causing your pain.   I reviewed your imaging with Dr. Clois. He does not recommend bracing.   I will send a message to the interventional radiologist to see if you are a candidate for a kyphoplasty procedure. They inject cement into the broken bone to help with pain.   Once I hear back from them, we will call and message you.   No bending, twisting, or lifting.   Please do not hesitate to call if you have any questions or concerns. You can also message me in MyChart.   Glade Boys PA-C [phone]     The physicians and staff at [facility] at [facility] are committed to providing excellent care. You may receive a survey asking for feedback about your experience at our office. We value you your feedback and appreciate you taking the time to to fill it out. The [facility] [place] leadership team is also available to discuss your experience in person, feel free to contact us  [phone].

## 2024-10-06 DIAGNOSIS — M549 Dorsalgia, unspecified: Secondary | ICD-10-CM | POA: Diagnosis not present

## 2024-10-06 DIAGNOSIS — J9611 Chronic respiratory failure with hypoxia: Secondary | ICD-10-CM | POA: Diagnosis not present

## 2024-10-06 DIAGNOSIS — G8929 Other chronic pain: Secondary | ICD-10-CM | POA: Diagnosis not present

## 2024-10-06 DIAGNOSIS — Z23 Encounter for immunization: Secondary | ICD-10-CM | POA: Diagnosis not present

## 2024-10-06 DIAGNOSIS — Z79899 Other long term (current) drug therapy: Secondary | ICD-10-CM | POA: Diagnosis not present

## 2024-10-06 DIAGNOSIS — J449 Chronic obstructive pulmonary disease, unspecified: Secondary | ICD-10-CM | POA: Diagnosis not present

## 2024-10-06 NOTE — Progress Notes (Signed)
 DIVISION OF PULMONARY AND CRITICAL CARE MEDICINE                              FOLLOW UP ENCOUNTER     Chief complaint: Advance COPD with chronic hypoxemia and pulmonary hypertension   History of Present Illness Rachael Blankenship is a 68 year old female with COPD who presents with persistent back pain and breathing difficulties. She is accompanied by a family member, who is actively involved in her care discussions.  She experiences persistent back pain following a spinal fracture, with the pain radiating around her back and into her chest. The pain is severe and worsening, described as a sensation of being 'broken in half.' She reports that an MRI was performed and that the radiology report described a new spinal fracture. She is concerned about using a brace as it might restrict her breathing.  She reports swelling in her ankles, which she attributes to not taking furosemide  due to concerns about frequent urination while out. Her lungs feel clearer, and she is able to get around the house, although she has to pace herself to avoid exacerbating her symptoms.  Her COPD management includes the use of Breztri inhaler twice daily and Dupixent injections every two weeks, although she was advised to hold off on Dupixent during her recent hospitalization. She reports improvement in mucus production and breathing, although she still experiences breathlessness despite an oxygen  saturation of 97%. She is currently on 50 mg of prednisone  daily, with two days remaining, and has been prescribed antibiotics, which she has two doses left of.  She has a history of COPD and has been told by her doctor that her FEV1 is about 30%. She uses oxygen  therapy and reports feeling fine while sitting but experiences breathlessness when active. She was recently hospitalized due to breathing difficulties and was discharged with a prescription for prednisone  and antibiotics.  She inquires  about the use of a CPAP machine and nebulizer treatments at home to manage acute episodes of breathlessness. She is concerned about her ability to manage her condition at home and seeks advice on how to avoid hospital visits.   Past Medical History:   Past Medical History:  Diagnosis Date  . Age-related osteoporosis without current pathological fracture 05/12/2012  . Chronic respiratory failure with hypoxia (CMS/HHS-HCC) 10/08/2023  . COPD (chronic obstructive pulmonary disease) (CMS/HHS-HCC)   . GERD (gastroesophageal reflux disease)   . Hyperlipemia 03/08/2014  . Pulmonary HTN (CMS/HHS-HCC) 03/08/2014    Past Surgical History:   Past Surgical History:  Procedure Laterality Date  . EGD  07/29/2018   Gastritis/No Repeat/TKT  . kidney stone    . TONSILLECTOMY & ADENOIDECTOMY      Allergies:   Allergies  Allergen Reactions  . Naproxen Itching  . Nsaids (Non-Steroidal Anti-Inflammatory Drug) Unknown  . Statins-Hmg-Coa Reductase Inhibitors Muscle Pain  . Sulfa (Sulfonamide Antibiotics) Unknown    Current Medications:   Prior to Admission medications  Medication Sig Taking? Last Dose  ALPRAZolam  (XANAX ) 0.25 MG tablet Take 1 tablet (0.25 mg total) by mouth at bedtime as needed for Sleep Yes Taking  ascorbic acid  (VITAMIN C ) 500 MG tablet Take 500 mg by mouth once daily. Yes Taking  aspirin  81 MG chewable tablet Take 81  mg by mouth once daily. Yes Taking  BREZTRI AEROSPHERE 160-9-4.8 mcg/actuation inhaler Inhale 2 inhalations into the lungs 2 (two) times daily Yes Taking  buPROPion  (WELLBUTRIN ) 75 MG tablet Take 1 tablet (75 mg total) by mouth 2 (two) times daily Yes Taking  cholecalciferol  (CHOLECALCIFEROL ) 1,000 unit tablet Take 1,000 Units by mouth once daily. Yes Taking  co-enzyme Q-10, ubiquinone, (CO Q-10) 100 mg capsule Take 200 mg by mouth once daily. Yes Taking  colchicine (COLCRYS) 0.6 mg tablet Take 1 tablet (0.6 mg total) by mouth once daily Yes Taking  DULoxetine   (CYMBALTA ) 60 MG DR capsule Take 1 capsule (60 mg total) by mouth once daily Yes Taking  dupilumab (DUPIXENT) 300 mg/2 mL pen injector Inject 2 mLs (300 mg total) subcutaneously every 14 (fourteen) days Yes Taking  dupilumab (DUPIXENT) 300 mg/2 mL pen injector Inject 2 mLs (300 mg total) subcutaneously every 14 (fourteen) days Yes Taking  famotidine  (PEPCID ) 40 MG tablet Take 1 tablet (40 mg total) by mouth 2 (two) times daily Yes Taking  fluoride, sodium, (DENTA 5000 PLUS) 1.1 % Take by mouth Yes Taking  fluticasone  propionate (FLONASE ) 50 mcg/actuation nasal spray Place 2 sprays into both nostrils once daily as needed for Rhinitis or Allergies Yes Taking  FUROsemide  (LASIX ) 40 MG tablet Take 1 tablet (40 mg total) by mouth once daily Yes Taking  gemfibroziL  (LOPID ) 600 mg tablet Take 1 tablet (600 mg total) by mouth 2 (two) times daily before meals Yes Taking  ipratropium-albuteroL  (COMBIVENT  RESPIMAT) 20-100 mcg/actuation inhaler Inhale 1 Puff into the lungs 4 (four) times daily as needed for Wheezing Yes Taking  ipratropium-albuteroL  (DUO-NEB) nebulizer solution Take 3 mLs by nebulization 4 (four) times daily as needed for Wheezing for up to 360 days Yes Taking  magnesium  250 mg Tab Take by mouth. Yes Taking  metoprolol  TARTrate (LOPRESSOR ) 25 MG tablet Take 1 tablet (25 mg total) by mouth 2 (two) times daily Yes Taking  multivit, Ca, min-FA-soy isofl 400-60 mcg-mg Tab Take 1 tablet by mouth once daily. Yes Taking  potassium chloride  (KLOR-CON  SPRINKLE) 8 mEq ER capsule Take 1 capsule (8 mEq total) by mouth once daily Yes Taking  sucralfate  (CARAFATE ) 1 gram tablet Take 1 tablet by mouth 4 (four) times daily Yes Taking  vitamin E  400 UNIT capsule Take 400 Units by mouth once daily. Yes Taking  azithromycin  (ZITHROMAX ) 250 MG tablet Take 1 tablet (250 mg total) by mouth every Monday, Wednesday, and Friday for 39 days    ibuprofen (ADVIL,MOTRIN) 800 MG tablet Take 800 mg by mouth every 6 (six)  hours as needed for Pain Patient confirms taking MOTRIN, as needed. Patient not taking: Reported on 10/06/2024  Not Taking  predniSONE  (DELTASONE ) 5 MG tablet Take 9 tablets (45 mg total) by mouth once daily for 5 days, THEN 8 tablets (40 mg total) once daily for 5 days, THEN 7 tablets (35 mg total) once daily for 5 days, THEN 6 tablets (30 mg total) once daily for 5 days, THEN 5 tablets (25 mg total) once daily for 5 days, THEN 4 tablets (20 mg total) once daily for 5 days, THEN 3 tablets (15 mg total) once daily for 5 days, THEN 2 tablets (10 mg total) once daily for 5 days, THEN 1 tablet (5 mg total) once daily for 5 days, THEN 0.5 tablets (2.5 mg total) once daily for 5 days.      Family History:  No family history on file.  Social History:   Social  History   Socioeconomic History  . Marital status: Married  Tobacco Use  . Smoking status: Former    Current packs/day: 0.00    Types: Cigarettes    Start date: 01/30/1974    Quit date: 10/11/2014    Years since quitting: 9.9  . Smokeless tobacco: Never  . Tobacco comments:    Patient confirms quit smoking 11/2023. 08/18/2024 -[facility]  Vaping Use  . Vaping status: Never Used  Substance and Sexual Activity  . Alcohol use: Yes    Alcohol/week: 0.0 standard drinks of alcohol   Social Drivers of Corporate Investment [employer]er Strain: Low Risk  (03/31/2024)   Overall Financial Resource Strain (CARDIA)   . Difficulty of Paying Living Expenses: Not hard at all  Food Insecurity: No Food Insecurity (09/29/2024)   Received from [facility]   Hunger Vital Sign   . Within the past 12 months, you worried that your food would run out before you got the money to buy more.: Never true   . Within the past 12 months, the food you bought just didn't last and you didn't have money to get more.: Never true  Transportation Needs: No Transportation Needs (09/29/2024)   Received from [facility] - Transportation   . In the past 12 months, has lack of  transportation kept you from medical appointments or from getting medications?: No   . In the past 12 months, has lack of transportation kept you from meetings, work, or from getting things needed for daily living?: No  Social Connections: Moderately Integrated (09/29/2024)   Received from [facility]   Social Connection and Isolation Panel   . In a typical week, how many times do you talk on the phone with family, friends, or neighbors?: More than three times a week   . How often do you get together with friends or relatives?: Never   . How often do you attend church or religious services?: Never   . Do you belong to any clubs or organizations such as church groups, unions, fraternal or athletic groups, or school groups?: Yes   . How often do you attend meetings of the clubs or organizations you belong to?: Never   . Are you married, widowed, divorced, separated, never married, or living with a partner?: Married  Housing Stability: Unknown (10/06/2024)   Housing Stability Vital Sign   . Unable to Pay for Housing in the Last Year: No   . Homeless in the Last Year: No    Review of Systems:   A 10 point review of systems is negative, except for the pertinent positives and negatives detailed in the HPI.  Vitals:   Vitals:   10/06/24 1027  BP: 130/82  Pulse: 65  SpO2: 96%  Weight: 64 kg (141 lb)  Height: 160 cm (5' 3)     Body mass index is 24.98 kg/m.  Physical Exam:   Physical Exam Vitals and nursing note reviewed.  Constitutional:      General: in no acute distress.    Appearance: Normal appearance. Is not ill-appearing, toxic-appearing or diaphoretic.  HENT:     Head: Normocephalic and atraumatic.     Right Ear: External ear normal.     Left Ear: External ear normal.  Eyes:     General:        Right eye: No discharge.        Left eye: No discharge.     Extraocular Movements: Extraocular movements intact.  Pupils: Pupils are equal, round, and reactive to light.   Cardiovascular:     Rate and Rhythm: Normal rate and regular rhythm.     Pulses: Normal pulses.     Heart sounds: Normal heart sounds. No murmur heard.    No friction rub. No gallop.  Abdominal:     General: Bowel sounds are normal.  Skin:    General: Skin is warm and dry.     Capillary Refill: Capillary refill takes less than 2 seconds.  Neurological:     Mental Status: Patient is alert.     Lab and Imaging Results:   Results RADIOLOGY Lumbar spine MRI: Progressive worsening with disc degeneration and vertebral fracture. New finding not present in previous imaging.  DIAGNOSTIC Pulmonary function test: FEV1 30%    Assessment and Plan:   Diagnoses and all orders for this visit:  Chronic obstructive pulmonary disease, unspecified COPD type (CMS/HHS-HCC)  Chronic mid back pain  Chronic hypoxemic respiratory failure (CMS/HHS-HCC)  Encounter for long-term (current) use of high-risk medication  Other orders -     predniSONE  (DELTASONE ) 5 MG tablet; Take 9 tablets (45 mg total) by mouth once daily for 5 days, THEN 8 tablets (40 mg total) once daily for 5 days, THEN 7 tablets (35 mg total) once daily for 5 days, THEN 6 tablets (30 mg total) once daily for 5 days, THEN 5 tablets (25 mg total) once daily for 5 days, THEN 4 tablets (20 mg total) once daily for 5 days, THEN 3 tablets (15 mg total) once daily for 5 days, THEN 2 tablets (10 mg total) once daily for 5 days, THEN 1 tablet (5 mg total) once daily for 5 days, THEN 0.5 tablets (2.5 mg total) once daily for 5 days. -     azithromycin  (ZITHROMAX ) 250 MG tablet; Take 1 tablet (250 mg total) by mouth every Monday, Wednesday, and Friday for 39 days    Assessment & Plan Advanced chronic obstructive pulmonary disease (COPD) with chronic hypoxemic respiratory failure and moderate persistent asthma COPD is stage 4 with a FEV1 of about 30%. Breathing is improved with Dupixent, which reduces mucus and steroid use, benefiting bone  strength. Oxygen  saturation is 97%, but breathlessness persists due to lung damage. Prednisone  taper is necessary to avoid side effects such as bone weakness, hyperglycemia, cataracts, and abdominal obesity. Dupixent is preferred over long-term prednisone  due to its advanced formulation and reduced side effects. - Continue Breztri inhaler twice daily. - Resume Dupixent next week. - Ordered prednisone  taper from 50 mg to 45 mg, then 40 mg, and so on. - Prescribed antibiotics for Monday, Wednesday, Friday for the next couple of months. - Administered flu shot.  Vertebral compression fracture due to age-related osteoporosis with severe back pain Severe back pain due to vertebral compression fracture, radiating around the back and chest. MRI shows a new fracture not present two years ago. Pain is worsening, and a brace is not recommended due to breathing concerns. Kyphoplasty is suggested for pain relief, involving cement injection to stabilize the fracture. The procedure is outpatient and expected to provide immediate relief. - Proceed with kyphoplasty consultation next week.  Lower extremity edema Mild ankle swelling, not severe. Furosemide  was not taken due to frequent bathroom needs. - Continue furosemide  as needed.  Immunization: Influenza vaccination Flu shot is due and will be administered to prevent respiratory complications. - Administered flu shot.    I spent 41 minutes in both face-to-face and non-face-to-face activities including reviewing history,  performing an exam and evaluation, entering clinical information EHR, interpreting results, counseling patient/family/caregiver, reviewing x-rays/CT scans/MRI/labs/echocardiography/PFTs, ordering meds/tests/procedures, referring and communicating with consulting healthcare professionals and care coordination. This does not include time spent with staff.    The patient and/or family voices understanding of the plans. All questions and  concerns were answered. The patient and /or family was instructed to call if the patient needs to be seen sooner. Thank you for allowing me to participate in the care of this patient. Do not hesitate to contact me via Epic or by calling our office at [phone] with any questions or concerns.     This note has been created using dictation software tool and any typographical errors are purely unintentional.  Patient received an After Visit Summary

## 2024-10-07 NOTE — Addendum Note (Signed)
 Addended byBETHA HILMA HASTINGS on: 10/07/2024 09:48 AM   Modules accepted: Orders

## 2024-10-19 ENCOUNTER — Ambulatory Visit
Admission: RE | Admit: 2024-10-19 | Discharge: 2024-10-19 | Disposition: A | Discharge status: Home / Self Care | Source: Ambulatory Visit | Attending: Orthopedic Surgery | Admitting: Orthopedic Surgery

## 2024-10-19 ENCOUNTER — Other Ambulatory Visit: Payer: Self-pay | Admitting: Physician Assistant

## 2024-10-19 DIAGNOSIS — S22050G Wedge compression fracture of T5-T6 vertebra, subsequent encounter for fracture with delayed healing: Secondary | ICD-10-CM | POA: Diagnosis not present

## 2024-10-19 DIAGNOSIS — J449 Chronic obstructive pulmonary disease, unspecified: Secondary | ICD-10-CM | POA: Diagnosis not present

## 2024-10-19 DIAGNOSIS — R7303 Prediabetes: Secondary | ICD-10-CM | POA: Diagnosis not present

## 2024-10-19 DIAGNOSIS — J9611 Chronic respiratory failure with hypoxia: Secondary | ICD-10-CM | POA: Diagnosis not present

## 2024-10-19 DIAGNOSIS — S22050A Wedge compression fracture of T5-T6 vertebra, initial encounter for closed fracture: Secondary | ICD-10-CM

## 2024-10-19 DIAGNOSIS — Z0000 Encounter for general adult medical examination without abnormal findings: Secondary | ICD-10-CM | POA: Diagnosis not present

## 2024-10-19 DIAGNOSIS — M159 Polyosteoarthritis, unspecified: Secondary | ICD-10-CM | POA: Diagnosis not present

## 2024-10-19 DIAGNOSIS — Z1331 Encounter for screening for depression: Secondary | ICD-10-CM | POA: Diagnosis not present

## 2024-10-19 DIAGNOSIS — S22000A Wedge compression fracture of unspecified thoracic vertebra, initial encounter for closed fracture: Secondary | ICD-10-CM

## 2024-10-19 DIAGNOSIS — M810 Age-related osteoporosis without current pathological fracture: Secondary | ICD-10-CM | POA: Diagnosis not present

## 2024-10-19 DIAGNOSIS — D692 Other nonthrombocytopenic purpura: Secondary | ICD-10-CM | POA: Diagnosis not present

## 2024-10-19 DIAGNOSIS — I272 Pulmonary hypertension, unspecified: Secondary | ICD-10-CM | POA: Diagnosis not present

## 2024-10-19 DIAGNOSIS — E782 Mixed hyperlipidemia: Secondary | ICD-10-CM | POA: Diagnosis not present

## 2024-10-19 DIAGNOSIS — I10 Essential (primary) hypertension: Secondary | ICD-10-CM | POA: Diagnosis not present

## 2024-10-19 DIAGNOSIS — E559 Vitamin D deficiency, unspecified: Secondary | ICD-10-CM | POA: Diagnosis not present

## 2024-10-19 HISTORY — PX: IR RADIOLOGIST EVAL & MGMT: IMG5224

## 2024-10-19 NOTE — Consult Note (Signed)
 Chief Complaint: Patient was seen in consultation today for midthoracic back pain at the request of Luna,Stacy  Referring Physician(s): Luna,Stacy  Supervising Physician: Karalee Beat  History of Present Illness: Rachael Blankenship is a 68 y.o. female with past medical history significant for depression, anxiety, endometriosis, GERD, HTN, HLD, CHF, pulmonary HTN, COPD on continuous supplemental oxygen  (2L baseline) and T5/T6 compression fracture who presents today to discuss possible kyphoplasty.  Rachael Blankenship reports a 1-2 year history of midthoracic back pain without known injury which she was managing well conservatively. She underwent outpatient bronchoscopy 09/17/24 due to previous imaging showing significant mucous plugging of the tracheobronchial tree bilaterally with collapse of the right middle lobe. The procedure was reported to be uncomplicated however Rachael Blankenship developed significant worsening of her mid back pain post procedure. She developed worsening dyspnea several days after the procedure and eventually presented to [facility] ED for evaluation where she was found to be in respiratory distress. She was admitted for further treatment of COPD exacerbation and during that admission reported severe worsening of her previously manageable midthoracic back pain. An MRI thoracic spine w/o contrast was obtained on 10/01/24 which showed:  1. Severe T6 compression fracture with 75% vertebral body height loss, sclerosis, and edema. Retropulsion results in mild to moderate spinal stenosis and severe bilateral T6 neural foraminal stenosis. 2. T5 inferior endplate compression fracture with 30% anterior vertebral body height loss and moderate marrow edema, new from 09/06/2024.  She was referred to outpatient neurosurgery for follow up and met with their service on 11/11. After further discussion, she has been referred to IR to discuss possible kyphoplasty.   [name] seen today in IR clinic,  she reports 90% improvement of her midthoracic back pain with conservative management at home. She rates the pain as a 6/10 at it's most severe. She is able to manage her ADLs as she did prior to the end of October. She denies any other areas of new pain, bowel or bladder dysfunction, numbness, tingling, foot drop, difficulty walking. She remains on her baseline supplemental oxygen  of 2L.  Past Medical History:  Diagnosis Date   Bruises easily    Chronic respiratory failure with hypoxia (HCC)    Cigarette smoker    Collapse of right lung    COPD (chronic obstructive pulmonary disease) (HCC)    COVID-19 07/2024   Dependence on supplemental oxygen     Depression with anxiety    Endometriosis    GERD (gastroesophageal reflux disease)    Grade I diastolic dysfunction    History of sepsis    Hyperlipidemia    Hypertension 06/02/2012   Kidney stones    Pulmonary HTN (HCC)    Reactive airway disease 03/08/2014   Vitamin D  deficiency     Past Surgical History:  Procedure Laterality Date   BRONCHIAL WASHINGS N/A 09/17/2024   Procedure: IRRIGATION, BRONCHUS;  Surgeon: Parris Manna, MD;  Location: [facility] ORS;  Service: Thoracic;  Laterality: N/A;   CATARACT EXTRACTION W/PHACO Right 12/04/2023   Procedure: CATARACT EXTRACTION PHACO AND INTRAOCULAR LENS PLACEMENT (IOC) RIGHT  CLAREON VIVITY LENS;  Surgeon: Enola [name], MD;  Location: [facility];  Service: Ophthalmology;  Laterality: Right;  10.40 1:04.0   CATARACT EXTRACTION W/PHACO Left 12/18/2023   Procedure: CATARACT EXTRACTION PHACO AND INTRAOCULAR LENS PLACEMENT (IOC) LEFT  CLAREON VIVITY TORIC 16.28, 01:19.2;  Surgeon: Enola [name], MD;  Location: [facility];  Service: Ophthalmology;  Laterality: Left;   COLONOSCOPY WITH PROPOFOL  N/A  01/02/2023   Procedure: COLONOSCOPY WITH PROPOFOL ;  Surgeon: Unk Corinn Skiff, MD;  Location: [facility] ENDOSCOPY;  Service: Gastroenterology;  Laterality: N/A;    ESOPHAGOGASTRODUODENOSCOPY N/A 07/29/2018   Procedure: ESOPHAGOGASTRODUODENOSCOPY (EGD);  Surgeon: Toledo, Ladell POUR, MD;  Location: [facility] ENDOSCOPY;  Service: Gastroenterology;  Laterality: N/A;   ESOPHAGOGASTRODUODENOSCOPY (EGD) WITH PROPOFOL  N/A 01/02/2023   Procedure: ESOPHAGOGASTRODUODENOSCOPY (EGD) WITH PROPOFOL ;  Surgeon: Unk Corinn Skiff, MD;  Location: [facility] ENDOSCOPY;  Service: Gastroenterology;  Laterality: N/A;   FLEXIBLE BRONCHOSCOPY N/A 09/17/2024   Procedure: BRONCHOSCOPY, FLEXIBLE;  Surgeon: Parris Manna, MD;  Location: [facility] ORS;  Service: Thoracic;  Laterality: N/A;   IR RADIOLOGIST EVAL & MGMT  10/19/2024   KIDNEY STONE SURGERY     RIGHT HEART CATH Right 04/21/2024   Procedure: RIGHT HEART CATH;  Surgeon: Ammon Blunt, MD;  Location: [facility] INVASIVE CV LAB;  Service: Cardiovascular;  Laterality: Right;   TONSILLECTOMY      Allergies: Naproxen and Sulfa antibiotics  Medications: Prior to Admission medications   Medication Sig Start Date End Date Taking? Authorizing Provider  ALPRAZolam  (XANAX ) 0.25 MG tablet Take 1 tablet (0.25 mg total) by mouth 3 (three) times daily as needed for anxiety. 12/11/23   Liana Fish, NP  ascorbic acid  (VITAMIN C ) 500 MG tablet Take 500 mg by mouth daily.    [provider]  aspirin  81 MG tablet Take 81 mg by mouth at bedtime.     [provider]  BREZTRI AEROSPHERE 160-9-4.8 MCG/ACT AERO inhaler Inhale 2 puffs into the lungs 2 (two) times daily. 04/12/24   [provider]  buPROPion  (WELLBUTRIN ) 75 MG tablet Take 75 mg by mouth 2 (two) times daily.    [provider]  Cholecalciferol  25 MCG (1000 UT) tablet Take 1,000 Units by mouth daily.     [provider]  Coenzyme Q10 (CO Q-10) 100 MG CAPS Take 100 mg by mouth daily.    [provider]  DENTA 5000 PLUS 1.1 % CREA dental cream Take 1 application  by mouth 2 (two) times daily as needed. 04/14/21   [provider]   dextromethorphan-guaiFENesin  (MUCINEX  DM) 30-600 MG 12hr tablet Take 1 tablet by mouth 2 (two) times daily as needed for cough. 10/02/24   Amin, Sumayya, MD  DULoxetine  (CYMBALTA ) 60 MG capsule Take 1 capsule (60 mg total) by mouth daily. 05/19/24   Liana Fish, NP  Dupilumab 300 MG/2ML SOAJ Inject 300 mg into the skin every 14 (fourteen) days. 01/27/24   [provider]  EPINEPHrine  0.3 mg/0.3 mL IJ SOAJ injection Inject 0.3 mg into the muscle as needed. 01/27/24   [provider]  famotidine  (PEPCID ) 40 MG tablet Take 1 tablet (40 mg total) by mouth 2 (two) times daily. 04/05/24   Liana Fish, NP  feeding supplement (ENSURE PLUS HIGH PROTEIN) LIQD Take 237 mLs by mouth 2 (two) times daily between meals. 10/03/24   Amin, Sumayya, MD  fluticasone  (FLONASE ) 50 MCG/ACT nasal spray Place 2 sprays into both nostrils daily.    [provider]  furosemide  (LASIX ) 40 MG tablet TAKE 1 TABLET BY MOUTH DAILY 04/12/24   Abernathy, Alyssa, NP  gemfibrozil  (LOPID ) 600 MG tablet Take 1 tablet (600 mg total) by mouth 2 (two) times daily before a meal. 03/29/24   Abernathy, Fish, NP  Ipratropium-Albuterol  (COMBIVENT  RESPIMAT) 20-100 MCG/ACT AERS respimat INHALE 1 PUFF INTO THE LUNGS EVERY 6 HOURS AS NEEDED FOR WHEEZING OR SHORTNESS OF BREATH 10/31/23   Khan, Fozia M, MD  Magnesium  250 MG CAPS Take 250 mg by mouth daily.    [provider]  metoprolol  tartrate (LOPRESSOR ) 25 MG tablet Take 0.5 tablets (12.5 mg total) by mouth 2 (two) times daily. 10/02/24   Amin, Sumayya, MD  Multiple Vitamins-Minerals (MULTIVITAMIN WITH MINERALS) tablet Take 1 tablet by mouth daily. Centrum Silver 50+    [provider]  OXYGEN  Inhale 2 L into the lungs continuous. Continuous oxygen  and NIV at night, 2 liters   pt uses [facility] for oxygen  supplies    [provider]  Potassium Chloride  CR (MICRO-K ) 8 MEQ CPCR capsule CR Take 1 capsule (8 mEq total) by mouth daily. 10/26/23   Khan,  Fozia M, MD  sucralfate  (CARAFATE ) 1 g tablet Take 1 g by mouth 4 (four) times daily as needed (Stomach).    [provider]  vitamin E  180 MG (400 UNITS) capsule Take 400 Units by mouth daily.    [provider]     Family History  Problem Relation Age of Onset   Hypertension Mother    Hyperlipidemia Mother    GER disease Mother    Heart attack Father        23 yrs ago   Hypertension Father    Parkinson's disease Father    GER disease Father     Social History   Socioeconomic History   Marital status: Married    Spouse name: Sam   Number of children: Not on file   Years of education: Not on file   Highest education level: Not on file  Occupational History   Not on file  Tobacco Use   Smoking status: Former    Current packs/day: 0.00    Average packs/day: 0.5 packs/day for 50.1 years (26.8 ttl pk-yrs)    Types: Cigarettes    Start date: 49    Quit date: 12/18/2023    Years since quitting: 0.8   Smokeless tobacco: Never  Vaping Use   Vaping status: Former  Substance and Sexual Activity   Alcohol use: Yes    Comment: ocassionally    Drug use: No   Sexual activity: Not on file  Other Topics Concern   Not on file  Social History Narrative   Not on file   Social Drivers of Health   Financial Resource Strain: Low Risk  (03/31/2024)   Received from [facility]   Overall Financial Resource Strain (CARDIA)    Difficulty of Paying Living Expenses: Not hard at all  Food Insecurity: No Food Insecurity (09/29/2024)   Hunger Vital Sign    Worried About Running Out of Food in the Last Year: Never true    Ran Out of Food in the Last Year: Never true  Transportation Needs: No Transportation Needs (09/29/2024)   PRAPARE - Administrator, Civil Service (Medical): No    Lack of Transportation (Non-Medical): No  Physical Activity: Not on file  Stress: Not on file  Social Connections: Moderately Integrated (09/29/2024)   Social  Connection and Isolation Panel    Frequency of Communication with Friends and Family: More than three times a week    Frequency of Social Gatherings with Friends and Family: Never    Attends Religious Services: Never    Database Administrator or Organizations: Yes    Attends Banker Meetings: Never    Marital Status: Married     Review of Systems: A 12 point ROS discussed and pertinent positives are indicated in  the HPI above.  All other systems are negative.  Review of Systems  Constitutional:  Negative for chills and fever.  Respiratory:  Negative for cough and shortness of breath.   Cardiovascular:  Negative for chest pain.  Gastrointestinal:  Negative for abdominal pain.  Musculoskeletal:  Positive for back pain (midthoracic - well controlled with home medications).  Skin:  Negative for wound.  Neurological:  Negative for weakness and numbness.    Vital Signs: BP 135/79 (BP Location: Left Arm, Patient Position: Sitting, Cuff Size: Normal)   Pulse 79   Temp 98.1 F (36.7 C) (Oral)   Resp 16   Wt 135 lb (61.2 kg)   SpO2 93%   PF (!) 2 L/min   BMI 23.91 kg/m   Physical Exam Vitals and nursing note reviewed.  Constitutional:      General: She is not in acute distress. HENT:     Head: Normocephalic.  Cardiovascular:     Rate and Rhythm: Normal rate.  Pulmonary:     Effort: Pulmonary effort is normal.     Comments: (+) Supplemental oxygen  via  Abdominal:     Palpations: Abdomen is soft.  Musculoskeletal:     Comments: (+) mild point tenderness to palpation in the midthoracic back at the approximate T5/T6 level. No other areas of point tenderness identified.  Skin:    General: Skin is warm and dry.  Neurological:     Mental Status: She is alert and oriented to person, place, and time.  Psychiatric:        Mood and Affect: Mood normal.        Behavior: Behavior normal.        Thought Content: Thought content normal.        Judgment: Judgment normal.       Imaging: IR Radiologist Eval & Mgmt Result Date: 10/19/2024 EXAM: NEW PATIENT OFFICE VISIT CHIEF COMPLAINT: SEE NOTE IN EPIC HISTORY OF PRESENT ILLNESS: SEE NOTE IN EPIC REVIEW OF SYSTEMS: SEE NOTE IN EPIC PHYSICAL EXAMINATION: SEE NOTE IN EPIC ASSESSMENT AND PLAN: SEE NOTE IN EPIC Electronically Signed   By: Wilkie Lent M.D.   On: 10/19/2024 09:38   DG Thoracic Spine 2 View Result Date: 10/06/2024 CLINICAL DATA:  Follow-up T6 fracture EXAM: DG THORACIC SPINE 2V COMPARISON:  CT 09/30/2024, MRI 10/01/2024 FINDINGS: Kyphosis of the upper thoracic spine centered at severe compression fracture of T6. Inferior endplate deformity at T5. These findings are stable. Remaining vertebra demonstrate normal stature. Mild multilevel degenerative osteophytes IMPRESSION: Severe compression fracture of T6 with kyphosis of the upper thoracic spine. Inferior endplate deformity at T5. These findings appear stable compared with the CT from earlier this month Electronically Signed   By: Luke Bun M.D.   On: 10/06/2024 22:25   DG Chest Port 1 View Result Date: 10/02/2024 EXAM: 1 VIEW(S) XRAY OF THE CHEST 10/02/2024 11:17:46 AM COMPARISON: 09/29/2024 CLINICAL HISTORY: Pneumonia FINDINGS: LUNGS AND PLEURA: Emphysema. No pulmonary edema. No pleural effusion. No pneumothorax. HEART AND MEDIASTINUM: Aortic atherosclerosis. No acute abnormality of the cardiac and mediastinal silhouettes. BONES AND SOFT TISSUES: Old healed right rib fractures. IMPRESSION: 1. No acute cardiopulmonary process. Electronically signed by: Norman Gatlin MD 10/02/2024 02:08 PM EST RP Workstation: [id number]   MR THORACIC SPINE WO CONTRAST Result Date: 10/01/2024 EXAM: MR Thoracic Spine without 10/01/2024 02:41:52 PM TECHNIQUE: Multiplanar multisequence MRI of the thoracic spine was performed without the administration of intravenous contrast. COMPARISON: CTA chest 09/30/2024 and 09/06/2024 CLINICAL HISTORY: compression fracture  FINDINGS: The examination is moderately motion degraded. BONES AND ALIGNMENT: Increased upper thoracic kyphosis. No significant listhesis. T6 compression fracture with approximately 75% vertebral body height loss, sclerosis, and edema with height loss greatly progressing from 09/06/2024. T5 inferior endplate compression fracture with 30% anterior vertebral body height loss and moderate marrow edema, new from 09/06/2024. No suspicious marrow lesion. SPINAL CORD: No definite thoracic spinal cord signal abnormality is identified; however, motion artifact severely limits assessment in the upper thoracic spine. SOFT TISSUES: Unremarkable. DEGENERATIVE CHANGES: T6 retropulsion and prominent dorsal epidural fat result in moderate spinal stenosis. Retropulsion also contributes to severe bilateral T6 neural foraminal stenosis. Mild disc bulging at T11-T12 without stenosis. No neural foraminal narrowing. IMPRESSION: 1. Severe T6 compression fracture with 75% vertebral body height loss, sclerosis, and edema. Retropulsion results in mild to moderate spinal stenosis and severe bilateral T6 neural foraminal stenosis. 2. T5 inferior endplate compression fracture with 30% anterior vertebral body height loss and moderate marrow edema, new from 09/06/2024. Electronically signed by: Dasie Hamburg MD 10/01/2024 04:11 PM EST RP Workstation: [id number]   CT Angio Chest Pulmonary Embolism (PE) W or WO Contrast Result Date: 09/30/2024 CLINICAL DATA:  Elevated D-dimer with dyspnea. Evaluate for pulmonary embolism. EXAM: CT ANGIOGRAPHY CHEST WITH CONTRAST TECHNIQUE: Multidetector CT imaging of the chest was performed using the standard protocol during bolus administration of intravenous contrast. Multiplanar CT image reconstructions and MIPs were obtained to evaluate the vascular anatomy. RADIATION DOSE REDUCTION: This exam was performed according to the departmental dose-optimization program which includes automated exposure control,  adjustment of the mA and/or kV according to patient size and/or use of iterative reconstruction technique. CONTRAST:  75mL OMNIPAQUE  IOHEXOL  350 MG/ML SOLN COMPARISON:  09/06/2024 and 11/29/2022 FINDINGS: Cardiovascular: Heart is normal size. There is calcified plaque over the left anterior descending coronary artery. Thoracic aorta is normal in caliber. Mild calcified plaque over the descending thoracic aorta. Pulmonary arterial system is well opacified without evidence of emboli. Remaining vascular structures are unremarkable. Mediastinum/Nodes: No evidence of mediastinal or hilar adenopathy. Remaining mediastinal structures are unremarkable. Lungs/Pleura: Lungs are adequately inflated. There is chronic collapse/atelectasis over the central right middle lobe with interval development of 2 small cavitary like foci over the collapsed central right middle lobe measuring approximately 1.3 and 1.1 cm respectively. There is a patchy airspace process over the right upper lobe and minimally over the medial left upper lobe likely due to an infectious or inflammatory process. Stable scarring over the lingula. No effusion. Mild tracheobronchomalacia unchanged from the recent prior exam from October although worse compared to January 2024. Upper Abdomen: Calcified plaque over the visualized upper abdomen. Remaining upper abdominal structures are unchanged without acute findings. Musculoskeletal: Interval worsening of a severe T6 compression fracture with mild impression on the anterior thecal sac from the posterior border of the compression fracture. New anterior wedging of T5. Review of the MIP images confirms the above findings. IMPRESSION: 1. No evidence of pulmonary embolism. 2. Chronic collapse/atelectasis over the central right middle lobe with interval development of 2 small cavitary like foci over the collapsed central right middle lobe measuring approximately 1.3 and 1.1 cm respectively. Findings may be due to an  infectious or inflammatory process and less likely developing neoplastic process. This should be re-evaluated on the follow-up CT recommended below. 3. Patchy airspace process over the right upper lobe and minimally over the medial left upper lobe likely due to an infectious or inflammatory process. Recommend follow-up CT 6-8 weeks. 4. Mild tracheobronchomalacia unchanged from  the recent prior exam from October, although worse compared to January 2024. 5. Interval worsening of a severe T6 compression fracture with new anterior wedging of T5. 6. Aortic atherosclerosis. Atherosclerotic coronary artery disease. Aortic Atherosclerosis (ICD10-I70.0). Electronically Signed   By: Toribio Agreste M.D.   On: 09/30/2024 10:59   DG Chest Port 1 View Result Date: 09/29/2024 EXAM: 1 VIEW(S) XRAY OF THE CHEST 09/29/2024 12:11:26 AM COMPARISON: 09/17/2024 CLINICAL HISTORY: SHOB COPD SHOB COPD FINDINGS: LUNGS AND PLEURA: Mild hyperinflation. Minimal residual linear densities at the left base could reflect residual atelectasis or scarring. No focal pulmonary opacity. No pulmonary edema. No pleural effusion. No pneumothorax. HEART AND MEDIASTINUM: Aortic atherosclerosis. No acute abnormality of the cardiac silhouette. BONES AND SOFT TISSUES: No acute osseous abnormality. IMPRESSION: 1. Mild hyperinflation consistent with COPD. 2. Electronically signed by: Franky Crease MD 09/29/2024 12:19 AM EST RP Workstation: [id number]    Labs:  CBC: Recent Labs    12/26/23 0502 09/17/24 1323 09/28/24 2347 09/29/24 0610  WBC 7.4 7.9 14.8* 10.9*  HGB 11.5* 11.7* 12.1 10.9*  HCT 35.8* 36.5 38.2 35.2*  PLT 367 459* 463* 388    COAGS: No results for input(s): INR, APTT in the last 8760 hours.  BMP: Recent Labs    09/29/24 0610 09/30/24 0552 10/01/24 0524 10/02/24 0543  NA 139 138 141 139  K 3.8 3.1* 3.7 4.0  CL 95* 94* 101 102  CO2 34* 33* 32 26  GLUCOSE 182* 115* 104* 142*  BUN 21 29* 27* 25*  CALCIUM 8.7* 9.1 8.9  8.9  CREATININE 0.77 0.78 0.68 0.68  GFRNONAA >60 >60 >60 >60    LIVER FUNCTION TESTS: Recent Labs    11/21/23 1326 12/19/23 2336 12/19/23 2336 12/20/23 1038 12/21/23 0434 12/22/23 0334 12/23/23 0334 12/24/23 0400 12/25/23 0343 12/26/23 0502  BILITOT 0.4 0.6  --  0.5 0.6  --   --   --   --   --   AST 24 29  --  36 49*  --   --   --   --   --   ALT 18 23  --  23 35  --   --   --   --   --   ALKPHOS 117 97  --  76 75  --   --   --   --   --   PROT 6.7 7.8  --  6.9 6.5  --   --   --   --   --   ALBUMIN 4.4 4.1   < > 3.5 3.1*   < > 3.0* 2.8* 2.8* 2.7*   < > = values in this interval not displayed.    TUMOR MARKERS: No results for input(s): AFPTM, CEA, CA199, CHROMGRNA in the last 8760 hours.  Assessment:  68 y/o F with history of COPD on continuous supplemental oxygen , CHF, pulmonary HTN and known T5/T6 compression fractures who presents today to discuss possible kyphoplasty.  Ms. Pequignot reports midthoracic back pain for several years with no known injury which was well managed at home, however after bronchoscopy on 09/17/24 she developed significant worsening of this pain and imaging performed during admission for COPD exacerbation 11/4-11/8 showed severe T6 compression fracture with 75%  vertebral body height loss and a T5 inferior endplate compression fracture with 30% vertebral body height loss. She was seen by neurosurgery as an outpatient and has been referred to IR for consideration of kyphoplasty.  On exam today Ms. Mullane reports 90% improvement  in her back pain with conservative management at home, now rating her most severe pain as a 6/10. She denies any other areas of back pain or new neurologic symptoms. She is completing ADLs just as she was prior to the worsened back pain. There is very mild point tenderness in the midthoracic back on exam today.  Plan: - Given reported significant improvement in back pain with conservative management over the last few weeks,  will hold off on kyphoplasty at this time in favor of continued conservative management. She has significant respiratory issues which would complicate sedation and procedure positioning, she would like to avoid invasive treatment if possible.  - Return to IR clinic in 4 weeks for follow up to ensure back pain has not worsened. If worsening occurs will reconsider kyphoplasty at that time. She knows to call us  if her back pain worsens prior to her next follow up visit.  Electronically Signed: Clotilda DELENA Hesselbach PA-C 10/19/2024, 9:46 AM   I spent a total of 30 Minutes  in face to face in clinical consultation, greater than 50% of which was counseling/coordinating care for midthoracic back pain.

## 2024-10-22 ENCOUNTER — Other Ambulatory Visit: Payer: Self-pay | Admitting: Internal Medicine

## 2024-10-22 DIAGNOSIS — I272 Pulmonary hypertension, unspecified: Secondary | ICD-10-CM

## 2024-10-22 DIAGNOSIS — R413 Other amnesia: Secondary | ICD-10-CM

## 2024-10-28 ENCOUNTER — Ambulatory Visit: Payer: Medicare Other | Admitting: Nurse Practitioner

## 2024-10-28 ENCOUNTER — Ambulatory Visit

## 2024-11-03 ENCOUNTER — Ambulatory Visit

## 2024-11-04 ENCOUNTER — Emergency Department

## 2024-11-04 ENCOUNTER — Inpatient Hospital Stay
Admission: EM | Admit: 2024-11-04 | Discharge: 2024-11-15 | Disposition: A | Source: Home / Self Care | Attending: Internal Medicine | Admitting: Internal Medicine

## 2024-11-04 DIAGNOSIS — J441 Chronic obstructive pulmonary disease with (acute) exacerbation: Principal | ICD-10-CM

## 2024-11-04 DIAGNOSIS — Z7189 Other specified counseling: Secondary | ICD-10-CM | POA: Diagnosis not present

## 2024-11-04 DIAGNOSIS — F418 Other specified anxiety disorders: Secondary | ICD-10-CM | POA: Diagnosis not present

## 2024-11-04 DIAGNOSIS — Z8249 Family history of ischemic heart disease and other diseases of the circulatory system: Secondary | ICD-10-CM

## 2024-11-04 DIAGNOSIS — Z7982 Long term (current) use of aspirin: Secondary | ICD-10-CM

## 2024-11-04 DIAGNOSIS — E785 Hyperlipidemia, unspecified: Secondary | ICD-10-CM | POA: Diagnosis present

## 2024-11-04 DIAGNOSIS — I5032 Chronic diastolic (congestive) heart failure: Secondary | ICD-10-CM | POA: Diagnosis present

## 2024-11-04 DIAGNOSIS — I110 Hypertensive heart disease with heart failure: Secondary | ICD-10-CM | POA: Diagnosis present

## 2024-11-04 DIAGNOSIS — I5030 Unspecified diastolic (congestive) heart failure: Secondary | ICD-10-CM | POA: Diagnosis not present

## 2024-11-04 DIAGNOSIS — Z8616 Personal history of COVID-19: Secondary | ICD-10-CM

## 2024-11-04 DIAGNOSIS — I10 Essential (primary) hypertension: Secondary | ICD-10-CM | POA: Diagnosis not present

## 2024-11-04 DIAGNOSIS — D72829 Elevated white blood cell count, unspecified: Secondary | ICD-10-CM | POA: Diagnosis present

## 2024-11-04 DIAGNOSIS — Z79620 Long term (current) use of immunosuppressive biologic: Secondary | ICD-10-CM

## 2024-11-04 DIAGNOSIS — Z886 Allergy status to analgesic agent status: Secondary | ICD-10-CM

## 2024-11-04 DIAGNOSIS — K219 Gastro-esophageal reflux disease without esophagitis: Secondary | ICD-10-CM | POA: Diagnosis present

## 2024-11-04 DIAGNOSIS — Z1152 Encounter for screening for COVID-19: Secondary | ICD-10-CM | POA: Diagnosis not present

## 2024-11-04 DIAGNOSIS — F419 Anxiety disorder, unspecified: Secondary | ICD-10-CM | POA: Diagnosis present

## 2024-11-04 DIAGNOSIS — J9622 Acute and chronic respiratory failure with hypercapnia: Secondary | ICD-10-CM | POA: Diagnosis present

## 2024-11-04 DIAGNOSIS — G9341 Metabolic encephalopathy: Secondary | ICD-10-CM | POA: Diagnosis present

## 2024-11-04 DIAGNOSIS — F32A Depression, unspecified: Secondary | ICD-10-CM | POA: Diagnosis present

## 2024-11-04 DIAGNOSIS — R000 Tachycardia, unspecified: Secondary | ICD-10-CM | POA: Diagnosis present

## 2024-11-04 DIAGNOSIS — Z79899 Other long term (current) drug therapy: Secondary | ICD-10-CM

## 2024-11-04 DIAGNOSIS — R069 Unspecified abnormalities of breathing: Secondary | ICD-10-CM | POA: Diagnosis not present

## 2024-11-04 DIAGNOSIS — D649 Anemia, unspecified: Secondary | ICD-10-CM | POA: Diagnosis present

## 2024-11-04 DIAGNOSIS — E876 Hypokalemia: Secondary | ICD-10-CM | POA: Diagnosis not present

## 2024-11-04 DIAGNOSIS — E8729 Other acidosis: Secondary | ICD-10-CM | POA: Diagnosis present

## 2024-11-04 DIAGNOSIS — Z87891 Personal history of nicotine dependence: Secondary | ICD-10-CM

## 2024-11-04 DIAGNOSIS — R61 Generalized hyperhidrosis: Secondary | ICD-10-CM | POA: Diagnosis not present

## 2024-11-04 DIAGNOSIS — J9811 Atelectasis: Secondary | ICD-10-CM | POA: Diagnosis present

## 2024-11-04 DIAGNOSIS — Z882 Allergy status to sulfonamides status: Secondary | ICD-10-CM

## 2024-11-04 DIAGNOSIS — J449 Chronic obstructive pulmonary disease, unspecified: Secondary | ICD-10-CM | POA: Diagnosis present

## 2024-11-04 DIAGNOSIS — Z8379 Family history of other diseases of the digestive system: Secondary | ICD-10-CM

## 2024-11-04 DIAGNOSIS — Z9981 Dependence on supplemental oxygen: Secondary | ICD-10-CM

## 2024-11-04 DIAGNOSIS — T486X5A Adverse effect of antiasthmatics, initial encounter: Secondary | ICD-10-CM | POA: Diagnosis present

## 2024-11-04 DIAGNOSIS — J9621 Acute and chronic respiratory failure with hypoxia: Principal | ICD-10-CM | POA: Diagnosis present

## 2024-11-04 DIAGNOSIS — Z7951 Long term (current) use of inhaled steroids: Secondary | ICD-10-CM

## 2024-11-04 DIAGNOSIS — Z0389 Encounter for observation for other suspected diseases and conditions ruled out: Secondary | ICD-10-CM | POA: Diagnosis not present

## 2024-11-04 DIAGNOSIS — R443 Hallucinations, unspecified: Secondary | ICD-10-CM | POA: Diagnosis not present

## 2024-11-04 DIAGNOSIS — Z515 Encounter for palliative care: Secondary | ICD-10-CM | POA: Diagnosis not present

## 2024-11-04 DIAGNOSIS — Z4682 Encounter for fitting and adjustment of non-vascular catheter: Secondary | ICD-10-CM | POA: Diagnosis not present

## 2024-11-04 DIAGNOSIS — J440 Chronic obstructive pulmonary disease with acute lower respiratory infection: Secondary | ICD-10-CM | POA: Diagnosis not present

## 2024-11-04 DIAGNOSIS — Z820 Family history of epilepsy and other diseases of the nervous system: Secondary | ICD-10-CM

## 2024-11-04 DIAGNOSIS — Z66 Do not resuscitate: Secondary | ICD-10-CM | POA: Diagnosis present

## 2024-11-04 DIAGNOSIS — R6510 Systemic inflammatory response syndrome (SIRS) of non-infectious origin without acute organ dysfunction: Secondary | ICD-10-CM | POA: Diagnosis present

## 2024-11-04 DIAGNOSIS — Z452 Encounter for adjustment and management of vascular access device: Secondary | ICD-10-CM | POA: Diagnosis not present

## 2024-11-04 DIAGNOSIS — R0689 Other abnormalities of breathing: Secondary | ICD-10-CM | POA: Diagnosis not present

## 2024-11-04 DIAGNOSIS — Z83438 Family history of other disorder of lipoprotein metabolism and other lipidemia: Secondary | ICD-10-CM

## 2024-11-04 DIAGNOSIS — R0902 Hypoxemia: Secondary | ICD-10-CM | POA: Diagnosis not present

## 2024-11-04 LAB — URINALYSIS, W/ REFLEX TO CULTURE (INFECTION SUSPECTED)
Bilirubin Urine: NEGATIVE
Glucose, UA: NEGATIVE mg/dL
Hgb urine dipstick: NEGATIVE
Ketones, ur: NEGATIVE mg/dL
Leukocytes,Ua: NEGATIVE
Nitrite: NEGATIVE
Protein, ur: 30 mg/dL — AB
Specific Gravity, Urine: 1.018 (ref 1.005–1.030)
pH: 7 (ref 5.0–8.0)

## 2024-11-04 LAB — CBC WITH DIFFERENTIAL/PLATELET
Abs Immature Granulocytes: 0.21 K/uL — ABNORMAL HIGH (ref 0.00–0.07)
Basophils Absolute: 0.1 K/uL (ref 0.0–0.1)
Basophils Relative: 0 %
Eosinophils Absolute: 0.2 K/uL (ref 0.0–0.5)
Eosinophils Relative: 1 %
HCT: 43.1 % (ref 36.0–46.0)
Hemoglobin: 13.2 g/dL (ref 12.0–15.0)
Immature Granulocytes: 1 %
Lymphocytes Relative: 23 %
Lymphs Abs: 4.6 K/uL — ABNORMAL HIGH (ref 0.7–4.0)
MCH: 27.2 pg (ref 26.0–34.0)
MCHC: 30.6 g/dL (ref 30.0–36.0)
MCV: 88.9 fL (ref 80.0–100.0)
Monocytes Absolute: 1.9 K/uL — ABNORMAL HIGH (ref 0.1–1.0)
Monocytes Relative: 9 %
Neutro Abs: 13.3 K/uL — ABNORMAL HIGH (ref 1.7–7.7)
Neutrophils Relative %: 66 %
Platelets: 639 K/uL — ABNORMAL HIGH (ref 150–400)
RBC: 4.85 MIL/uL (ref 3.87–5.11)
RDW: 13.9 % (ref 11.5–15.5)
WBC: 20.2 K/uL — ABNORMAL HIGH (ref 4.0–10.5)
nRBC: 0 % (ref 0.0–0.2)

## 2024-11-04 LAB — COMPREHENSIVE METABOLIC PANEL WITH GFR
ALT: 51 U/L — ABNORMAL HIGH (ref 0–44)
AST: 25 U/L (ref 15–41)
Albumin: 4.4 g/dL (ref 3.5–5.0)
Alkaline Phosphatase: 138 U/L — ABNORMAL HIGH (ref 38–126)
Anion gap: 13 (ref 5–15)
BUN: 25 mg/dL — ABNORMAL HIGH (ref 8–23)
CO2: 32 mmol/L (ref 22–32)
Calcium: 10.7 mg/dL — ABNORMAL HIGH (ref 8.9–10.3)
Chloride: 99 mmol/L (ref 98–111)
Creatinine, Ser: 0.89 mg/dL (ref 0.44–1.00)
GFR, Estimated: >60 mL/min (ref >60)
Glucose, Bld: 194 mg/dL — ABNORMAL HIGH (ref 70–99)
Potassium: 5 mmol/L (ref 3.5–5.1)
Sodium: 144 mmol/L (ref 135–145)
Total Bilirubin: 0.4 mg/dL (ref 0.0–1.2)
Total Protein: 8.9 g/dL — ABNORMAL HIGH (ref 6.5–8.1)

## 2024-11-04 LAB — BLOOD GAS, VENOUS
Acid-Base Excess: 9.6 mmol/L — ABNORMAL HIGH (ref 0.0–2.0)
Bicarbonate: 38.5 mmol/L — ABNORMAL HIGH (ref 20.0–28.0)
O2 Saturation: 64.5 %
Patient temperature: 37
pCO2, Ven: 73 mmHg (ref 44–60)
pH, Ven: 7.33 (ref 7.25–7.43)
pO2, Ven: 38 mmHg (ref 32–45)

## 2024-11-04 LAB — LACTIC ACID, PLASMA
Lactic Acid, Venous: 2.5 mmol/L (ref 0.5–1.9)
Lactic Acid, Venous: 1 mmol/L (ref 0.5–1.9)

## 2024-11-04 LAB — RESP PANEL BY RT-PCR (RSV, FLU A&B, COVID)  RVPGX2
Influenza A by PCR: NEGATIVE
Influenza B by PCR: NEGATIVE
Resp Syncytial Virus by PCR: NEGATIVE
SARS Coronavirus 2 by RT PCR: NEGATIVE

## 2024-11-04 MED ORDER — LORAZEPAM 2 MG/ML IJ SOLN
0.5000 mg | INTRAMUSCULAR | Status: DC | PRN
  Administered 2024-11-04: 0.5 mg via INTRAVENOUS
  Filled 2024-11-04: qty 1

## 2024-11-04 MED ORDER — DOXYCYCLINE HYCLATE 100 MG PO TABS
100.0000 mg | ORAL_TABLET | Freq: Two times a day (BID) | ORAL | Status: DC
  Administered 2024-11-04: 100 mg via ORAL
  Filled 2024-11-04: qty 1

## 2024-11-04 MED ORDER — PREDNISONE 20 MG PO TABS: 40.0000 mg | ORAL_TABLET | Freq: Every day | ORAL | Status: DC

## 2024-11-04 MED ORDER — ONDANSETRON HCL 4 MG PO TABS: 4.0000 mg | ORAL_TABLET | Freq: Four times a day (QID) | ORAL | Status: DC | PRN

## 2024-11-04 MED ORDER — LEVALBUTEROL HCL 0.63 MG/3ML IN NEBU: 0.6300 mg | INHALATION_SOLUTION | Freq: Four times a day (QID) | RESPIRATORY_TRACT | Status: DC | PRN

## 2024-11-04 MED ORDER — SODIUM CHLORIDE 0.9 % IV BOLUS
1000.0000 mL | Freq: Once | INTRAVENOUS | Status: AC
  Administered 2024-11-04: 1000 mL via INTRAVENOUS

## 2024-11-04 MED ORDER — IPRATROPIUM BROMIDE 0.02 % IN SOLN
0.5000 mg | Freq: Four times a day (QID) | RESPIRATORY_TRACT | Status: DC
  Administered 2024-11-04 (×2): 0.5 mg via RESPIRATORY_TRACT
  Filled 2024-11-04 (×2): qty 2.5

## 2024-11-04 MED ORDER — ENOXAPARIN SODIUM 40 MG/0.4ML IJ SOSY
40.0000 mg | PREFILLED_SYRINGE | INTRAMUSCULAR | Status: DC
  Administered 2024-11-04 – 2024-11-14 (×11): 40 mg via SUBCUTANEOUS
  Filled 2024-11-04 (×11): qty 0.4

## 2024-11-04 MED ORDER — ALBUTEROL SULFATE (2.5 MG/3ML) 0.083% IN NEBU
5.0000 mg | INHALATION_SOLUTION | Freq: Once | RESPIRATORY_TRACT | Status: AC
  Administered 2024-11-04: 5 mg via RESPIRATORY_TRACT
  Filled 2024-11-04: qty 6

## 2024-11-04 MED ORDER — METHYLPREDNISOLONE SODIUM SUCC 125 MG IJ SOLR: 125.0000 mg | INTRAMUSCULAR | Status: DC

## 2024-11-04 MED ORDER — METHYLPREDNISOLONE SODIUM SUCC 40 MG IJ SOLR
40.0000 mg | Freq: Two times a day (BID) | INTRAMUSCULAR | Status: AC
  Administered 2024-11-04 – 2024-11-05 (×2): 40 mg via INTRAVENOUS
  Filled 2024-11-04 (×2): qty 1

## 2024-11-04 MED ORDER — SODIUM CHLORIDE 0.9 % IV SOLN
2.0000 g | Freq: Once | INTRAVENOUS | Status: AC
  Administered 2024-11-04: 2 g via INTRAVENOUS
  Filled 2024-11-04: qty 20

## 2024-11-04 MED ORDER — IPRATROPIUM BROMIDE 0.02 % IN SOLN: 0.5000 mg | Freq: Four times a day (QID) | RESPIRATORY_TRACT | Status: DC | PRN

## 2024-11-04 MED ORDER — ACETAMINOPHEN 325 MG PO TABS
650.0000 mg | ORAL_TABLET | Freq: Four times a day (QID) | ORAL | Status: DC | PRN
  Administered 2024-11-04: 650 mg via ORAL
  Filled 2024-11-04: qty 2

## 2024-11-04 MED ORDER — SODIUM CHLORIDE 0.9 % IV SOLN
100.0000 mg | Freq: Two times a day (BID) | INTRAVENOUS | Status: DC
  Administered 2024-11-04 – 2024-11-09 (×10): 100 mg via INTRAVENOUS
  Filled 2024-11-04 (×11): qty 100

## 2024-11-04 MED ORDER — SODIUM CHLORIDE 0.9 % IV SOLN
500.0000 mg | Freq: Once | INTRAVENOUS | Status: AC
  Administered 2024-11-04: 500 mg via INTRAVENOUS
  Filled 2024-11-04: qty 5

## 2024-11-04 MED ORDER — LEVALBUTEROL HCL 0.63 MG/3ML IN NEBU
0.6300 mg | INHALATION_SOLUTION | Freq: Four times a day (QID) | RESPIRATORY_TRACT | Status: DC | PRN
  Administered 2024-11-04: 0.63 mg via RESPIRATORY_TRACT
  Filled 2024-11-04: qty 3

## 2024-11-04 MED ORDER — LACTATED RINGERS IV SOLN: INTRAVENOUS | Status: AC

## 2024-11-04 MED ORDER — ONDANSETRON HCL 4 MG/2ML IJ SOLN: 4.0000 mg | Freq: Four times a day (QID) | INTRAMUSCULAR | Status: DC | PRN

## 2024-11-04 MED ORDER — ACETAMINOPHEN 650 MG RE SUPP: 650.0000 mg | Freq: Four times a day (QID) | RECTAL | Status: DC | PRN

## 2024-11-04 MED ORDER — BUDESON-GLYCOPYRROL-FORMOTEROL 160-9-4.8 MCG/ACT IN AERO
2.0000 | INHALATION_SPRAY | Freq: Two times a day (BID) | RESPIRATORY_TRACT | Status: DC
  Filled 2024-11-04 (×2): qty 5.9

## 2024-11-04 MED ORDER — MORPHINE SULFATE (PF) 2 MG/ML IV SOLN: 2.0000 mg | INTRAVENOUS | Status: DC | PRN

## 2024-11-04 MED ORDER — ACETAZOLAMIDE SODIUM 500 MG IJ SOLR
500.0000 mg | Freq: Once | INTRAMUSCULAR | Status: AC
  Administered 2024-11-04: 500 mg via INTRAVENOUS
  Filled 2024-11-04: qty 500

## 2024-11-04 NOTE — Progress Notes (Signed)
 Pt having increased WOB and SOB pt was placed back on bipap on the documented settings

## 2024-11-04 NOTE — Sepsis Progress Note (Signed)
 Elink will follow per sepsis protocol.

## 2024-11-04 NOTE — ED Notes (Signed)
 Critical blood gas called by RT.   CO2 73 pH 7.33  Dr Viviann notified.

## 2024-11-04 NOTE — Progress Notes (Signed)
 Pt arrived via EMS at approx 1120 on cpap.  Pt was placed on PS on the documented settings.  2 UD of albuterol  were given via aerogen/bipap.  HR-147 BP 166/148 Sp02-95%  pt has increased WOB with accessory muscle use and SOB  Critical C02 of 73 which was called to [employer] RN

## 2024-11-04 NOTE — ED Notes (Signed)
 Fall risk bundle is currently in place.

## 2024-11-04 NOTE — H&P (Signed)
 History and Physical    Rachael Blankenship [name]:[id number] DOB: 1956-05-08 DOA: 11/04/2024  PCP: Fernande Ophelia JINNY DOUGLAS, MD (Confirm with patient/family/NH records and if not entered, this has to be entered at [facility] point of entry) Patient coming from: Home  I have personally briefly reviewed patient's old medical records in [facility]  Chief Complaint: Cough, wheezing, shortness of breath  HPI: Rachael Blankenship is a 68 y.o. female with medical history significant of COPD Gold stage IV, chronic hypoxic and hypercapnic respiratory failure on as needed oxygen  and nocturnal BiPAP, GERD, HTN, chronic HFpEF, pulmonary hypertension, HLD, presented with worsening of cough wheezing shortness of breath.  Symptoms started about 7 days ago when patient started to have a dry cough wheezing and increasing exertional dyspnea.  Denies any fever chills no chest pains.  Was started on additional albuterol  inhaler on Monday however patient did not tolerate albuterol  inhaler as she felt  palpitations and chest tightness as a result her breathing symptoms has progressively getting worse over the last 3 days.  No fever or chills.  Denied any ankle edema.  Family called EMS, EMS arrived and gave patient IV Solu-Medrol  DuoNebs hypoxic 71% on room air, tachycardia heart rate in the 100-1 20s, blood pressure elevated 160/100 acute, O2 saturation 71% on room air and stabilized on BiPAP with a setting of 16/8.  Chest x-ray negative for acute infiltrates.  Patient was given ceftriaxone  and azithromycin  daily.   Review of Systems: As per HPI otherwise 14 point review of systems negative.   Past Medical History:  Diagnosis Date   Bruises easily    Chronic respiratory failure with hypoxia (HCC)    Cigarette smoker    Collapse of right lung    COPD (chronic obstructive pulmonary disease) (HCC)    COVID-19 07/2024   Dependence on supplemental oxygen     Depression with anxiety    Endometriosis    GERD (gastroesophageal  reflux disease)    Grade I diastolic dysfunction    History of sepsis    Hyperlipidemia    Hypertension 06/02/2012   Kidney stones    Pulmonary HTN (HCC)    Reactive airway disease 03/08/2014   Vitamin D  deficiency     Past Surgical History:  Procedure Laterality Date   BRONCHIAL WASHINGS N/A 09/17/2024   Procedure: IRRIGATION, BRONCHUS;  Surgeon: Parris Manna, MD;  Location: [facility] ORS;  Service: Thoracic;  Laterality: N/A;   CATARACT EXTRACTION W/PHACO Right 12/04/2023   Procedure: CATARACT EXTRACTION PHACO AND INTRAOCULAR LENS PLACEMENT (IOC) RIGHT  CLAREON VIVITY LENS;  Surgeon: Enola [name], MD;  Location: [facility];  Service: Ophthalmology;  Laterality: Right;  10.40 1:04.0   CATARACT EXTRACTION W/PHACO Left 12/18/2023   Procedure: CATARACT EXTRACTION PHACO AND INTRAOCULAR LENS PLACEMENT (IOC) LEFT  CLAREON VIVITY TORIC 16.28, 01:19.2;  Surgeon: Enola [name], MD;  Location: [facility];  Service: Ophthalmology;  Laterality: Left;   COLONOSCOPY WITH PROPOFOL  N/A 01/02/2023   Procedure: COLONOSCOPY WITH PROPOFOL ;  Surgeon: Unk Corinn Skiff, MD;  Location: [facility] ENDOSCOPY;  Service: Gastroenterology;  Laterality: N/A;   ESOPHAGOGASTRODUODENOSCOPY N/A 07/29/2018   Procedure: ESOPHAGOGASTRODUODENOSCOPY (EGD);  Surgeon: Toledo, Ladell MARLA, MD;  Location: [facility] ENDOSCOPY;  Service: Gastroenterology;  Laterality: N/A;   ESOPHAGOGASTRODUODENOSCOPY (EGD) WITH PROPOFOL  N/A 01/02/2023   Procedure: ESOPHAGOGASTRODUODENOSCOPY (EGD) WITH PROPOFOL ;  Surgeon: Unk Corinn Skiff, MD;  Location: [facility] ENDOSCOPY;  Service: Gastroenterology;  Laterality: N/A;   FLEXIBLE BRONCHOSCOPY N/A 09/17/2024   Procedure: BRONCHOSCOPY, FLEXIBLE;  Surgeon:  Parris Manna, MD;  Location: [facility] ORS;  Service: Thoracic;  Laterality: N/A;   IR RADIOLOGIST EVAL & MGMT  10/19/2024   KIDNEY STONE SURGERY     RIGHT HEART CATH Right 04/21/2024   Procedure: RIGHT HEART CATH;  Surgeon:  Ammon Blunt, MD;  Location: [facility] INVASIVE CV LAB;  Service: Cardiovascular;  Laterality: Right;   TONSILLECTOMY       reports that she quit smoking about 10 months ago. Her smoking use included cigarettes. She started smoking about 50 years ago. She has a 26.8 pack-year smoking history. She has never used smokeless tobacco. She reports current alcohol use. She reports that she does not use drugs.  Allergies[1]  Family History  Problem Relation Age of Onset   Hypertension Mother    Hyperlipidemia Mother    GER disease Mother    Heart attack Father        23 yrs ago   Hypertension Father    Parkinson's disease Father    GER disease Father      Prior to Admission medications  Medication Sig Start Date End Date Taking? Authorizing Provider  azithromycin  (ZITHROMAX ) 250 MG tablet Take 250 mg by mouth every other day. 10/06/24 11/14/24 Yes [provider]  colchicine 0.6 MG tablet Take 0.6 mg by mouth daily. 09/13/24  Yes [provider]  predniSONE  (DELTASONE ) 5 MG tablet Take by mouth. Take 9 tablets (45 mg total) by mouth once daily for 5 days, THEN 8 tablets (40 mg total) once daily for 5 days, THEN 7 tablets (35 mg total) once daily for 5 days, THEN 6 tablets (30 mg total) once daily for 5 days, THEN 5 tablets (25 mg total) once daily for 5 days, THEN 4 tablets (20 mg total) once daily for 5 days, THEN 3 tablets (15 mg total) once daily for 5 days, THEN 2 tablets (10 mg total) once daily for 5 days, THEN 1 tablet (5 mg total) once daily for 5 days, THEN 0.5 tablets (2.5 mg total) once daily for 5 days. 10/06/24 11/25/24 Yes [provider]  ALPRAZolam  (XANAX ) 0.25 MG tablet Take 1 tablet (0.25 mg total) by mouth 3 (three) times daily as needed for anxiety. 12/11/23   Abernathy, Alyssa, NP  ascorbic acid  (VITAMIN C ) 500 MG tablet Take 500 mg by mouth daily.    [provider]  aspirin  81 MG tablet Take 81 mg by mouth at bedtime.     [provider]  BREZTRI AEROSPHERE 160-9-4.8 MCG/ACT AERO inhaler Inhale 2 puffs into the lungs 2 (two) times daily. 04/12/24   [provider]  buPROPion  (WELLBUTRIN ) 75 MG tablet Take 75 mg by mouth 2 (two) times daily.    [provider]  Cholecalciferol  25 MCG (1000 UT) tablet Take 1,000 Units by mouth daily.     [provider]  Coenzyme Q10 (CO Q-10) 100 MG CAPS Take 100 mg by mouth daily.    [provider]  DENTA 5000 PLUS 1.1 % CREA dental cream Take 1 application  by mouth 2 (two) times daily as needed. 04/14/21   [provider]  dextromethorphan-guaiFENesin  (MUCINEX  DM) 30-600 MG 12hr tablet Take 1 tablet by mouth 2 (two) times daily as needed for cough. 10/02/24   Amin, Sumayya, MD  DULoxetine  (CYMBALTA ) 60 MG capsule Take 1 capsule (60 mg total) by mouth daily. 05/19/24   Liana Fish, NP  Dupilumab 300 MG/2ML SOAJ Inject 300 mg into the skin every 14 (fourteen) days. 01/27/24  [provider]  EPINEPHrine  0.3 mg/0.3 mL IJ SOAJ injection Inject 0.3 mg into the muscle as needed. 01/27/24   [provider]  famotidine  (PEPCID ) 40 MG tablet Take 1 tablet (40 mg total) by mouth 2 (two) times daily. 04/05/24   Liana Fish, NP  feeding supplement (ENSURE PLUS HIGH PROTEIN) LIQD Take 237 mLs by mouth 2 (two) times daily between meals. 10/03/24   Amin, Sumayya, MD  fluticasone  (FLONASE ) 50 MCG/ACT nasal spray Place 2 sprays into both nostrils daily.    [provider]  furosemide  (LASIX ) 40 MG tablet TAKE 1 TABLET BY MOUTH DAILY 04/12/24   Liana Fish, NP  gemfibrozil  (LOPID ) 600 MG tablet Take 1 tablet (600 mg total) by mouth 2 (two) times daily before a meal. 03/29/24   Abernathy, Fish, NP  Ipratropium-Albuterol  (COMBIVENT  RESPIMAT) 20-100 MCG/ACT AERS respimat INHALE 1 PUFF INTO THE LUNGS EVERY 6 HOURS AS NEEDED FOR WHEEZING OR SHORTNESS OF BREATH 10/31/23   Khan, Fozia M, MD  Magnesium  250 MG CAPS Take 250 mg by  mouth daily.    [provider]  metoprolol  tartrate (LOPRESSOR ) 25 MG tablet Take 0.5 tablets (12.5 mg total) by mouth 2 (two) times daily. 10/02/24   Amin, Sumayya, MD  Multiple Vitamins-Minerals (MULTIVITAMIN WITH MINERALS) tablet Take 1 tablet by mouth daily. Centrum Silver 50+    [provider]  OXYGEN  Inhale 2 L into the lungs continuous. Continuous oxygen  and NIV at night, 2 liters   pt uses [facility] for oxygen  supplies    [provider]  Potassium Chloride  CR (MICRO-K ) 8 MEQ CPCR capsule CR Take 1 capsule (8 mEq total) by mouth daily. 10/26/23   Khan, Fozia M, MD  sucralfate  (CARAFATE ) 1 g tablet Take 1 g by mouth 4 (four) times daily as needed (Stomach).    [provider]  vitamin E  180 MG (400 UNITS) capsule Take 400 Units by mouth daily.    [provider]    Physical Exam: Vitals:   11/04/24 1123 11/04/24 1300 11/04/24 1330 11/04/24 1400  BP: (!) 166/148 126/77 (!) 141/77 (!) 176/96  Pulse: (!) 154 95 94 100  Resp: 17 (!) 24 (!) 27 (!) 28  Temp: 97.6 F (36.4 C)   98 F (36.7 C)  TempSrc: Axillary   Axillary  SpO2: (!) 71% 100% 100% 100%    Constitutional: NAD, calm, comfortable Vitals:   11/04/24 1123 11/04/24 1300 11/04/24 1330 11/04/24 1400  BP: (!) 166/148 126/77 (!) 141/77 (!) 176/96  Pulse: (!) 154 95 94 100  Resp: 17 (!) 24 (!) 27 (!) 28  Temp: 97.6 F (36.4 C)   98 F (36.7 C)  TempSrc: Axillary   Axillary  SpO2: (!) 71% 100% 100% 100%   Eyes: PERRL, lids and conjunctivae normal ENMT: Mucous membranes are moist. Posterior pharynx clear of any exudate or lesions.Normal dentition.  Neck: normal, supple, no masses, no thyromegaly Respiratory: Diminished breath sound bilaterally, scattered wheezing, no crackles, increasing respiratory effort. No accessory muscle use.  Cardiovascular: Regular rate and rhythm, no murmurs / rubs / gallops. No extremity edema. 2+ pedal pulses. No carotid bruits.  Abdomen: no tenderness, no  masses palpated. No hepatosplenomegaly. Bowel sounds positive.  Musculoskeletal: no clubbing / cyanosis. No joint deformity upper and lower extremities. Good ROM, no contractures. Normal muscle tone.  Skin: no rashes, lesions, ulcers. No induration Neurologic: CN 2-12 grossly intact. Sensation intact, DTR normal. Strength 5/5 in all 4.  Psychiatric: Normal judgment and insight. Alert  and oriented x 3. Normal mood.     Labs on Admission: I have personally reviewed following labs and imaging studies  CBC: Recent Labs  Lab 11/04/24 1132  WBC 20.2*  NEUTROABS 13.3*  HGB 13.2  HCT 43.1  MCV 88.9  PLT 639*   Basic Metabolic Panel: Recent Labs  Lab 11/04/24 1132  NA 144  K 5.0  CL 99  CO2 32  GLUCOSE 194*  BUN 25*  CREATININE 0.89  CALCIUM 10.7*   GFR: CrCl cannot be calculated (Unknown ideal weight.). Liver Function Tests: Recent Labs  Lab 11/04/24 1132  AST 25  ALT 51*  ALKPHOS 138*  BILITOT 0.4  PROT 8.9*  ALBUMIN 4.4   No results for input(s): LIPASE, AMYLASE in the last 168 hours. No results for input(s): AMMONIA in the last 168 hours. Coagulation Profile: No results for input(s): INR, PROTIME in the last 168 hours. Cardiac Enzymes: No results for input(s): CKTOTAL, CKMB, CKMBINDEX, TROPONINI in the last 168 hours. BNP (last 3 results) No results for input(s): PROBNP in the last 8760 hours. HbA1C: No results for input(s): HGBA1C in the last 72 hours. CBG: No results for input(s): GLUCAP in the last 168 hours. Lipid Profile: No results for input(s): CHOL, HDL, LDLCALC, TRIG, CHOLHDL, LDLDIRECT in the last 72 hours. Thyroid  Function Tests: No results for input(s): TSH, T4TOTAL, FREET4, T3FREE, THYROIDAB in the last 72 hours. Anemia Panel: No results for input(s): VITAMINB12, FOLATE, FERRITIN, TIBC, IRON, RETICCTPCT in the last 72 hours. Urine analysis:    Component Value Date/Time   COLORURINE  YELLOW (A) 05/21/2018 1759   APPEARANCEUR Turbid (A) 09/13/2021 1708   LABSPEC 1.006 05/21/2018 1759   PHURINE 6.0 05/21/2018 1759   GLUCOSEU Negative 09/13/2021 1708   HGBUR NEGATIVE 05/21/2018 1759   BILIRUBINUR Negative 09/13/2021 1708   KETONESUR NEGATIVE 05/21/2018 1759   PROTEINUR Negative 09/13/2021 1708   PROTEINUR NEGATIVE 05/21/2018 1759   NITRITE Negative 09/13/2021 1708   NITRITE NEGATIVE 05/21/2018 1759   LEUKOCYTESUR 1+ (A) 09/13/2021 1708    Radiological Exams on Admission: DG Chest Port 1 View Result Date: 11/04/2024 EXAM: XR Chest, 1 View(s). CLINICAL HISTORY: Questionable sepsis - evaluate for abnormality. COMPARISON: 10/02/2024. FINDINGS: LUNGS: The lungs are clear. No consolidation. PLEURAL SPACES: No pleural effusion or pneumothorax. HEART: The heart size is normal. BONES: No acute osseous abnormality. IMPRESSION: 1. No acute cardiopulmonary abnormality. Electronically signed by: Lynwood Seip MD 11/04/2024 12:20 PM EST RP Workstation: [id number]    EKG: Independently reviewed.  Sinus tachycardia, no acute ST changes.  Assessment/Plan Principal Problem:   COPD (chronic obstructive pulmonary disease) (HCC) Active Problems:   Acute hypoxic on chronic hypercapnic respiratory failure (HCC)  (please populate well all problems here in Problem List. (For example, if patient is on BP meds at home and you resume or decide to hold them, it is a problem that needs to be her. Same for CAD, COPD, HLD and so on)  Acute on chronic hypoxic and hypercapnic respiratory failure - Continue IV Solu-Medrol  40 mg twice daily - Continue antibiotics coverage, changed to doxycycline  twice daily - Continue ICS and LABA -1 dose of IV Diamox and repeat VBG tomorrow - Patient has poor tolerance of albuterol , when she developed tachycardia and accelerated blood pressure.  Will change albuterol  to Atrovent every 6 hours plus APRN Xopenex. - Incentive spirometry  SIRS Leukocytosis - Patient  has tachycardia and leukocytosis, likely secondary to COPD exacerbation.  Tachycardia likely secondary to albuterol 's side effect.  Doubt  pneumonia or sepsis. - Antibiotics coverage for COPD exacerbation.  HTN Chronic HFpEF -Euvolemic, continue beta-blocker  Anxiety/depression - Mentation at baseline, continue Cymbalta   GERD - Controlled, continue PPI and Carafate .   DVT prophylaxis: Lovenox  Code Status: Full code Family Communication: Husband at bedside Disposition Plan: Patient is sick with severe COPD exacerbation requiring BiPAP support and IV steroid, expect more than 2 midnight hospital stay. Consults called: None Admission status: PCU admit   Cort ONEIDA Mana MD [facility] Pager [phone]  11/04/2024, 2:32 PM       [1]  Allergies Allergen Reactions   Naproxen Itching   Sulfa Antibiotics Hives and Rash

## 2024-11-04 NOTE — ED Notes (Signed)
 Respiratory to bedside. Pt placed on Bipap .

## 2024-11-04 NOTE — ED Provider Notes (Signed)
 [facility] Provider Note    Event Date/Time   First MD Initiated Contact with Patient 11/04/24 1120     (approximate)   History   Chief Complaint: Shortness of Breath   HPI  Rachael Blankenship is a 68 y.o. female with a history of COPD on 3 L nasal cannula at home, hypertension, pulmonary hypertension who comes to the ED due to shortness of breath that started this morning.  She was previously in her usual state of health, eating drinking, taking her medications as normal.  EMS report patient was hypoxic, and struggling to breathe so I put her on CPAP.  Patient denies pain or fever.        Past Medical History:  Diagnosis Date   Bruises easily    Chronic respiratory failure with hypoxia (HCC)    Cigarette smoker    Collapse of right lung    COPD (chronic obstructive pulmonary disease) (HCC)    COVID-19 07/2024   Dependence on supplemental oxygen     Depression with anxiety    Endometriosis    GERD (gastroesophageal reflux disease)    Grade I diastolic dysfunction    History of sepsis    Hyperlipidemia    Hypertension 06/02/2012   Kidney stones    Pulmonary HTN (HCC)    Reactive airway disease 03/08/2014   Vitamin D  deficiency     Current Outpatient Rx   Order #: [id number] Class: Normal   Order #: [id number] Class: Historical Med   Order #: [id number] Class: Historical Med   Order #: [id number] Class: Historical Med   Order #: [id number] Class: Historical Med   Order #: [id number] Class: Historical Med   Order #: [id number] Class: Historical Med   Order #: [id number] Class: Historical Med   Order #: [id number] Class: Normal   Order #: [id number] Class: Normal   Order #: [id number] Class: Historical Med   Order #: [id number] Class: Historical Med   Order #: [id number] Class: Normal   Order #: [id number] Class: Normal   Order #: [id number] Class: Historical Med   Order #: [id number] Class: Normal   Order #: [id number] Class: Normal   Order #: [id number] Class: Normal    Order #: [id number] Class: Historical Med   Order #: [id number] Class: Normal   Order #: [id number] Class: Historical Med   Order #: [id number] Class: Historical Med   Order #: [id number] Class: Normal   Order #: [id number] Class: Historical Med   Order #: [id number] Class: Historical Med    Past Surgical History:  Procedure Laterality Date   BRONCHIAL WASHINGS N/A 09/17/2024   Procedure: IRRIGATION, BRONCHUS;  Surgeon: Parris Manna, MD;  Location: [facility] ORS;  Service: Thoracic;  Laterality: N/A;   CATARACT EXTRACTION W/PHACO Right 12/04/2023   Procedure: CATARACT EXTRACTION PHACO AND INTRAOCULAR LENS PLACEMENT (IOC) RIGHT  CLAREON VIVITY LENS;  Surgeon: Enola [name], MD;  Location: [facility];  Service: Ophthalmology;  Laterality: Right;  10.40 1:04.0   CATARACT EXTRACTION W/PHACO Left 12/18/2023   Procedure: CATARACT EXTRACTION PHACO AND INTRAOCULAR LENS PLACEMENT (IOC) LEFT  CLAREON VIVITY TORIC 16.28, 01:19.2;  Surgeon: Enola [name], MD;  Location: [facility];  Service: Ophthalmology;  Laterality: Left;   COLONOSCOPY WITH PROPOFOL  N/A 01/02/2023   Procedure: COLONOSCOPY WITH PROPOFOL ;  Surgeon: Unk Corinn Skiff, MD;  Location: [facility] ENDOSCOPY;  Service: Gastroenterology;  Laterality: N/A;   ESOPHAGOGASTRODUODENOSCOPY N/A 07/29/2018   Procedure: ESOPHAGOGASTRODUODENOSCOPY (EGD);  Surgeon: Toledo, Ladell MARLA, MD;  Location: [facility] ENDOSCOPY;  Service: Gastroenterology;  Laterality: N/A;   ESOPHAGOGASTRODUODENOSCOPY (EGD) WITH PROPOFOL  N/A 01/02/2023   Procedure: ESOPHAGOGASTRODUODENOSCOPY (EGD) WITH PROPOFOL ;  Surgeon: Unk Corinn Skiff, MD;  Location: [facility] ENDOSCOPY;  Service: Gastroenterology;  Laterality: N/A;   FLEXIBLE BRONCHOSCOPY N/A 09/17/2024   Procedure: BRONCHOSCOPY, FLEXIBLE;  Surgeon: Parris Manna, MD;  Location: [facility] ORS;  Service: Thoracic;  Laterality: N/A;   IR RADIOLOGIST EVAL & MGMT  10/19/2024   KIDNEY STONE SURGERY     RIGHT HEART CATH Right  04/21/2024   Procedure: RIGHT HEART CATH;  Surgeon: Ammon Blunt, MD;  Location: [facility] INVASIVE CV LAB;  Service: Cardiovascular;  Laterality: Right;   TONSILLECTOMY      Physical Exam   Triage Vital Signs: ED Triage Vitals  Encounter Vitals Group     BP      Girls Systolic BP Percentile      Girls Diastolic BP Percentile      Boys Systolic BP Percentile      Boys Diastolic BP Percentile      Pulse      Resp      Temp      Temp src      SpO2      Weight      Height      Head Circumference      Peak Flow      Pain Score      Pain Loc      Pain Education      Exclude from Growth Chart     Most recent vital signs: Vitals:   11/04/24 1123  BP: (!) 166/148  Pulse: (!) 154  Resp: 17  Temp: 97.6 F (36.4 C)  SpO2: (!) 71%    General: Awake, moderate respiratory distress.  CV:  Good peripheral perfusion.  Tachycardia heart rate 130 Resp:  Tachypnea, accessory muscle use.  Speaking 1 word at a time.  Diminished air movement diffusely with prolonged expiratory phase, expiratory wheezing. Abd:  No distention.  Soft nontender Other:  Trace peripheral edema bilaterally   ED Results / Procedures / Treatments   Labs (all labs ordered are listed, but only abnormal results are displayed) Labs Reviewed  LACTIC ACID, PLASMA - Abnormal; Notable for the following components:      Result Value   Lactic Acid, Venous 2.5 (*)    All other components within normal limits  COMPREHENSIVE METABOLIC PANEL WITH GFR - Abnormal; Notable for the following components:   Glucose, Bld 194 (*)    BUN 25 (*)    Calcium 10.7 (*)    Total Protein 8.9 (*)    ALT 51 (*)    Alkaline Phosphatase 138 (*)    All other components within normal limits  CBC WITH DIFFERENTIAL/PLATELET - Abnormal; Notable for the following components:   WBC 20.2 (*)    Platelets 639 (*)    Neutro Abs 13.3 (*)    Lymphs Abs 4.6 (*)    Monocytes Absolute 1.9 (*)    Abs Immature Granulocytes 0.21 (*)    All  other components within normal limits  BLOOD GAS, VENOUS - Abnormal; Notable for the following components:   pCO2, Ven 73 (*)    Bicarbonate 38.5 (*)    Acid-Base Excess 9.6 (*)    All other components within normal limits  RESP PANEL BY RT-PCR (RSV, FLU A&B, COVID)  [id number]  CULTURE, BLOOD (ROUTINE X 2)  CULTURE, BLOOD (ROUTINE X 2)  LACTIC ACID, PLASMA  URINALYSIS, W/ REFLEX TO CULTURE (INFECTION SUSPECTED)     EKG Interpreted by me Sinus tachycardia rate 142.  Normal axis, prolonged QTc of 545  ms.  Normal QRS ST segments and T waves.   RADIOLOGY Chest x-ray interpreted by me, shows lung hyperinflation consistent with COPD.  No focal consolidation or pneumothorax.  Radiology report reviewed   PROCEDURES:  .Critical Care  Performed by: Viviann Pastor, MD Authorized by: Viviann Pastor, MD   Critical care provider statement:    Critical care time (minutes):  35   Critical care time was exclusive of:  Separately billable procedures and treating other patients   Critical care was necessary to treat or prevent imminent or life-threatening deterioration of the following conditions:  Sepsis and respiratory failure   Critical care was time spent personally by me on the following activities:  Development of treatment plan with patient or surrogate, discussions with consultants, evaluation of patient's response to treatment, examination of patient, obtaining history from patient or surrogate, ordering and performing treatments and interventions, ordering and review of laboratory studies, ordering and review of radiographic studies, pulse oximetry, re-evaluation of patient's condition and review of old charts   Care discussed with: admitting provider      MEDICATIONS ORDERED IN ED: Medications  lactated ringers  infusion ( Intravenous New Bag/Given 11/04/24 1243)  cefTRIAXone  (ROCEPHIN ) 2 g in sodium chloride  0.9 % 100 mL IVPB (0 g Intravenous Stopped 11/04/24 1210)  azithromycin   (ZITHROMAX ) 500 mg in sodium chloride  0.9 % 250 mL IVPB (0 mg Intravenous Stopped 11/04/24 1314)  albuterol  (PROVENTIL ) (2.5 MG/3ML) 0.083% nebulizer solution 5 mg (5 mg Nebulization Given 11/04/24 1127)  sodium chloride  0.9 % bolus 1,000 mL (0 mLs Intravenous Stopped 11/04/24 1315)  albuterol  (PROVENTIL ) (2.5 MG/3ML) 0.083% nebulizer solution 5 mg (5 mg Nebulization Given 11/04/24 1348)     IMPRESSION / MDM / ASSESSMENT AND PLAN / ED COURSE  I reviewed the triage vital signs and the nursing notes.  DDx: COPD exacerbation, pneumothorax, pneumonia, sepsis, AKI, electrolyte derangement  Patient's presentation is most consistent with acute presentation with potential threat to life or bodily function.     Clinical Course as of 11/04/24 1411  Thu Nov 04, 2024  1121 Presents with respiratory distress, 77% on RA in ED. , Tachypnea with accessory muscle use.  Also tachycardic.  Sepsis workup, BiPAP.  Will need to admit. [PS]  1410 Patient appears much more comfortable.  Still having increased work of breathing and some wheezing but improving.  Case discussed with hospitalist for further management. [PS]    Clinical Course User Index [PS] Viviann Pastor, MD     FINAL CLINICAL IMPRESSION(S) / ED DIAGNOSES   Final diagnoses:  COPD exacerbation (HCC)  Acute on chronic respiratory failure with hypoxia (HCC)     Rx / DC Orders   ED Discharge Orders     None        Note:  This document was prepared using Dragon voice recognition software and may include unintentional dictation errors.   Viviann Pastor, MD 11/04/24 [phone]

## 2024-11-04 NOTE — ED Triage Notes (Signed)
 Pt BIB EMS from home with complaints of SOB. Per EMS pt was 88% RA on their arrival. Pt is normally on 3L via [place] at home. Pt on Cpap. Pt not a great historian.     125mg  Sol 1 duo neb 1 albuteral

## 2024-11-05 ENCOUNTER — Other Ambulatory Visit: Payer: Self-pay

## 2024-11-05 ENCOUNTER — Encounter: Payer: Self-pay | Admitting: Internal Medicine

## 2024-11-05 ENCOUNTER — Inpatient Hospital Stay

## 2024-11-05 DIAGNOSIS — Z4682 Encounter for fitting and adjustment of non-vascular catheter: Secondary | ICD-10-CM | POA: Diagnosis not present

## 2024-11-05 DIAGNOSIS — J9811 Atelectasis: Secondary | ICD-10-CM

## 2024-11-05 DIAGNOSIS — Z452 Encounter for adjustment and management of vascular access device: Secondary | ICD-10-CM | POA: Diagnosis not present

## 2024-11-05 DIAGNOSIS — J9622 Acute and chronic respiratory failure with hypercapnia: Secondary | ICD-10-CM

## 2024-11-05 DIAGNOSIS — J9621 Acute and chronic respiratory failure with hypoxia: Principal | ICD-10-CM

## 2024-11-05 DIAGNOSIS — Z87891 Personal history of nicotine dependence: Secondary | ICD-10-CM

## 2024-11-05 LAB — BLOOD GAS, VENOUS
Acid-Base Excess: 0.1 mmol/L (ref 0.0–2.0)
Acid-Base Excess: 2.3 mmol/L — ABNORMAL HIGH (ref 0.0–2.0)
Bicarbonate: 27.6 mmol/L (ref 20.0–28.0)
Bicarbonate: 29.5 mmol/L — ABNORMAL HIGH (ref 20.0–28.0)
O2 Saturation: 78.7 %
O2 Saturation: 92.4 %
Patient temperature: 37
Patient temperature: 37
pCO2, Ven: 56 mmHg (ref 44–60)
pCO2, Ven: 56 mmHg (ref 44–60)
pH, Ven: 7.3 (ref 7.25–7.43)
pH, Ven: 7.33 (ref 7.25–7.43)
pO2, Ven: 46 mmHg — ABNORMAL HIGH (ref 32–45)
pO2, Ven: 62 mmHg — ABNORMAL HIGH (ref 32–45)

## 2024-11-05 LAB — RENAL FUNCTION PANEL
Albumin: 4.2 g/dL (ref 3.5–5.0)
Anion gap: 16 — ABNORMAL HIGH (ref 5–15)
BUN: 26 mg/dL — ABNORMAL HIGH (ref 8–23)
CO2: 24 mmol/L (ref 22–32)
Calcium: 9.6 mg/dL (ref 8.9–10.3)
Chloride: 103 mmol/L (ref 98–111)
Creatinine, Ser: 0.67 mg/dL (ref 0.44–1.00)
GFR, Estimated: >60 mL/min (ref >60)
Glucose, Bld: 135 mg/dL — ABNORMAL HIGH (ref 70–99)
Phosphorus: 2.6 mg/dL (ref 2.5–4.6)
Potassium: 4.2 mmol/L (ref 3.5–5.1)
Sodium: 143 mmol/L (ref 135–145)

## 2024-11-05 LAB — MRSA NEXT GEN BY PCR, NASAL: MRSA by PCR Next Gen: NOT DETECTED

## 2024-11-05 LAB — CBC
HCT: 38.4 % (ref 36.0–46.0)
Hemoglobin: 11.6 g/dL — ABNORMAL LOW (ref 12.0–15.0)
MCH: 27 pg (ref 26.0–34.0)
MCHC: 30.2 g/dL (ref 30.0–36.0)
MCV: 89.3 fL (ref 80.0–100.0)
Platelets: 539 K/uL — ABNORMAL HIGH (ref 150–400)
RBC: 4.3 MIL/uL (ref 3.87–5.11)
RDW: 14 % (ref 11.5–15.5)
WBC: 19.1 K/uL — ABNORMAL HIGH (ref 4.0–10.5)
nRBC: 0 % (ref 0.0–0.2)

## 2024-11-05 LAB — PROCALCITONIN: Procalcitonin: 0.24 ng/mL

## 2024-11-05 LAB — BLOOD GAS, ARTERIAL
Acid-Base Excess: 0.8 mmol/L (ref 0.0–2.0)
Bicarbonate: 28.5 mmol/L — ABNORMAL HIGH (ref 20.0–28.0)
Delivery systems: POSITIVE
FIO2: 35 %
O2 Saturation: 100 %
PEEP: 5 cmH2O
Patient temperature: 37
Pressure support: 10 cmH2O
pCO2 arterial: 58 mmHg — ABNORMAL HIGH (ref 32–48)
pH, Arterial: 7.3 — ABNORMAL LOW (ref 7.35–7.45)
pO2, Arterial: 111 mmHg — ABNORMAL HIGH (ref 83–108)

## 2024-11-05 LAB — GLUCOSE, CAPILLARY: Glucose-Capillary: 141 mg/dL — ABNORMAL HIGH (ref 70–99)

## 2024-11-05 LAB — STREP PNEUMONIAE URINARY ANTIGEN: Strep Pneumo Urinary Antigen: NEGATIVE

## 2024-11-05 MED ORDER — FENTANYL BOLUS VIA INFUSION
25.0000 ug | INTRAVENOUS | Status: DC | PRN
  Administered 2024-11-06: 100 ug via INTRAVENOUS
  Administered 2024-11-07: 50 ug via INTRAVENOUS
  Administered 2024-11-07 – 2024-11-08 (×3): 100 ug via INTRAVENOUS

## 2024-11-05 MED ORDER — HYDRALAZINE HCL 20 MG/ML IJ SOLN
10.0000 mg | Freq: Four times a day (QID) | INTRAMUSCULAR | Status: DC | PRN
  Administered 2024-11-05 – 2024-11-11 (×4): 10 mg via INTRAVENOUS
  Filled 2024-11-05 (×4): qty 1

## 2024-11-05 MED ORDER — MORPHINE SULFATE (PF) 2 MG/ML IV SOLN
INTRAVENOUS | Status: AC
  Filled 2024-11-05: qty 1

## 2024-11-05 MED ORDER — SODIUM BICARBONATE 8.4 % IV SOLN
50.0000 meq | Freq: Once | INTRAVENOUS | Status: AC
  Administered 2024-11-05: 50 meq via INTRAVENOUS
  Filled 2024-11-05: qty 50

## 2024-11-05 MED ORDER — ETOMIDATE 2 MG/ML IV SOLN
10.0000 mg | Freq: Once | INTRAVENOUS | Status: AC
  Administered 2024-11-05: 10 mg via INTRAVENOUS
  Filled 2024-11-05: qty 10

## 2024-11-05 MED ORDER — FENTANYL 2500MCG IN NS 250ML (10MCG/ML) PREMIX INFUSION
0.0000 ug/h | INTRAVENOUS | Status: DC
  Administered 2024-11-05 – 2024-11-06 (×2): 50 ug/h via INTRAVENOUS
  Administered 2024-11-07: 12:00:00 75 ug/h via INTRAVENOUS
  Filled 2024-11-05 (×3): qty 250

## 2024-11-05 MED ORDER — LORAZEPAM 2 MG/ML IJ SOLN
0.5000 mg | INTRAMUSCULAR | Status: DC | PRN
  Administered 2024-11-05: 1 mg via INTRAVENOUS

## 2024-11-05 MED ORDER — METHYLPREDNISOLONE SODIUM SUCC 40 MG IJ SOLR
40.0000 mg | Freq: Two times a day (BID) | INTRAMUSCULAR | Status: DC
  Administered 2024-11-06 – 2024-11-09 (×7): 40 mg via INTRAVENOUS
  Filled 2024-11-05 (×7): qty 1

## 2024-11-05 MED ORDER — ROCURONIUM BROMIDE 10 MG/ML (PF) SYRINGE
50.0000 mg | PREFILLED_SYRINGE | Freq: Once | INTRAVENOUS | Status: AC
  Administered 2024-11-05: 50 mg via INTRAVENOUS
  Filled 2024-11-05: qty 10

## 2024-11-05 MED ORDER — FENTANYL BOLUS VIA INFUSION: 25.0000 ug | INTRAVENOUS | Status: DC | PRN

## 2024-11-05 MED ORDER — CHLORHEXIDINE GLUCONATE CLOTH 2 % EX PADS
6.0000 | MEDICATED_PAD | Freq: Every day | CUTANEOUS | Status: DC
  Administered 2024-11-06 – 2024-11-14 (×9): 6 via TOPICAL

## 2024-11-05 MED ORDER — LORAZEPAM 2 MG/ML IJ SOLN
INTRAMUSCULAR | Status: AC
  Filled 2024-11-05: qty 1

## 2024-11-05 MED ORDER — NOREPINEPHRINE 4 MG/250ML-% IV SOLN
INTRAVENOUS | Status: AC
  Administered 2024-11-05: 4 mg via INTRAVENOUS
  Filled 2024-11-05: qty 250

## 2024-11-05 MED ORDER — MIDAZOLAM HCL 2 MG/2ML IJ SOLN
INTRAMUSCULAR | Status: AC
  Administered 2024-11-05: 2 mg via INTRAVENOUS
  Filled 2024-11-05: qty 2

## 2024-11-05 MED ORDER — BUDESONIDE 0.5 MG/2ML IN SUSP
0.5000 mg | Freq: Two times a day (BID) | RESPIRATORY_TRACT | Status: DC
  Administered 2024-11-05 – 2024-11-15 (×20): 0.5 mg via RESPIRATORY_TRACT
  Filled 2024-11-05 (×20): qty 2

## 2024-11-05 MED ORDER — METHYLPREDNISOLONE SODIUM SUCC 40 MG IJ SOLR: 40.0000 mg | Freq: Every day | INTRAMUSCULAR | Status: DC

## 2024-11-05 MED ORDER — DOCUSATE SODIUM 50 MG/5ML PO LIQD
100.0000 mg | Freq: Two times a day (BID) | ORAL | Status: DC
  Administered 2024-11-06 – 2024-11-08 (×4): 100 mg
  Filled 2024-11-05 (×6): qty 10

## 2024-11-05 MED ORDER — POLYETHYLENE GLYCOL 3350 17 G PO PACK
17.0000 g | PACK | Freq: Every day | ORAL | Status: DC
  Administered 2024-11-06 – 2024-11-08 (×3): 17 g
  Filled 2024-11-05 (×3): qty 1

## 2024-11-05 MED ORDER — MORPHINE SULFATE (PF) 2 MG/ML IV SOLN
2.0000 mg | Freq: Once | INTRAVENOUS | Status: AC
  Administered 2024-11-05: 2 mg via INTRAVENOUS

## 2024-11-05 MED ORDER — IPRATROPIUM-ALBUTEROL 0.5-2.5 (3) MG/3ML IN SOLN
3.0000 mL | RESPIRATORY_TRACT | Status: DC
  Administered 2024-11-05 – 2024-11-09 (×24): 3 mL via RESPIRATORY_TRACT
  Filled 2024-11-05 (×24): qty 3

## 2024-11-05 MED ORDER — METOPROLOL TARTRATE 25 MG PO TABS
12.5000 mg | ORAL_TABLET | Freq: Two times a day (BID) | ORAL | Status: DC
  Administered 2024-11-05: 12.5 mg via ORAL
  Filled 2024-11-05: qty 1

## 2024-11-05 MED ORDER — SODIUM CHLORIDE 0.9 % IV SOLN
2.0000 g | Freq: Two times a day (BID) | INTRAVENOUS | Status: DC
  Administered 2024-11-05 – 2024-11-07 (×4): 2 g via INTRAVENOUS
  Filled 2024-11-05 (×5): qty 12.5

## 2024-11-05 MED ORDER — FUROSEMIDE 10 MG/ML IJ SOLN
40.0000 mg | Freq: Once | INTRAMUSCULAR | Status: AC
  Administered 2024-11-05: 40 mg via INTRAVENOUS
  Filled 2024-11-05: qty 4

## 2024-11-05 MED ORDER — MIDAZOLAM HCL (PF) 2 MG/2ML IJ SOLN: 2.0000 mg | Freq: Once | INTRAMUSCULAR | Status: AC

## 2024-11-05 MED ORDER — DEXMEDETOMIDINE HCL IN NACL 400 MCG/100ML IV SOLN
0.0000 ug/kg/h | INTRAVENOUS | Status: DC
  Administered 2024-11-05 (×2): 0.4 ug/kg/h via INTRAVENOUS
  Administered 2024-11-06: 0.8 ug/kg/h via INTRAVENOUS
  Administered 2024-11-07: 0.6 ug/kg/h via INTRAVENOUS
  Filled 2024-11-05 (×4): qty 100

## 2024-11-05 NOTE — Consult Note (Signed)
 NAME:  Rachael Blankenship, MRN:  [id number], DOB:  [birth year], LOS: 1 ADMISSION DATE:  11/04/2024, CONSULTATION DATE: 11/05/2024 REFERRING MD: Nena Rebel, MD CHIEF COMPLAINT: COPD exacerbation  History of Present Illness:   Patient is a 68 year old female with a history of smoking and COPD with chronic hypoxic and hypercapnic respiratory failure who presents to the hospital with increased shortness of breath, cough, and wheezing consistent with a COPD exacerbation.  Patient reports symptom onset around 7 days ago with worsening shortness of breath, cough, wheezing, and increased exertional dyspnea.  She reports having increased oxygen  requirements and inability to walk or move short distances without having significant desaturation.  This further worsened over this week and she was unable to wait until her scheduled appointment with her primary pulmonologist prompting her to present to the ED.  EMS was activated and she was brought in with hypoxia and increased work of breathing.  She received IV steroids and was started on BiPAP for respiratory distress and admitted to the medicine service for further workup and management.  Patient started on IV Solu-Medrol , doxycycline , and received a dose of acetazolamide.  Given lack of improvement today, pulmonary and critical care medicine were consulted for increased risk of respiratory failure.  Medical chart reviewed, patient had a history of smoking and she quit 1 year ago.  Patient has been followed by outpatient pulmonary for severe COPD with her last FEV1 at 0.61 L (29% predicted).  She has chronic hypoxia for which she was started on supplemental oxygen .  Patient was admitted to the hospital in January 2025 with acute on chronic hypoxic respiratory failure with ICU admission.  She was continued on nocturnal BiPAP after send admission and switched to triple therapy with Breztri and Ensifentrine  was considered.  She was started on Dupixent to reduce  frequency of COPD exacerbations early in 2025.  She was further referred to cardiology for consideration of a right heart catheterization for workup of pulmonary hypertension.  Right heart cath 04/21/2024 showed RA 10/6, PA 40/12 (24), and PCWP of 20 not indicative of pulmonary hypertension.  CT chest ruled out PE in October 2025 after an exacerbation.  This was notable for unchanged complete collapse of the right middle lobe with atelectasis in the lingula.  She then underwent bronchoscopy with BAL of the right middle lobe with cultures returning negative.  Patient then admitted in November 2025 with COPD exacerbation treated with antibiotics and discharged on levofloxacin  as well as a prednisone  taper.  Patient seen by her pulmonologist in early November where her prednisone  taper was extended (taper by 5 mg every 5 days).  She was also started on thrice weekly azithromycin .  Pertinent  Medical History  -Severe COPD - Chronic hypoxic respiratory failure  Significant Hospital Events: Including procedures, antibiotic start and stop dates in addition to other pertinent events   11/04/24: Admitted with COPD exacerbation, on BiPAP 11/05/24: Consult to critical care for respiratory failure and risk of decompensation and need for intubation and mechanical ventilation  Objective    Blood pressure (!) 163/91, pulse (!) 119, temperature 97.6 F (36.4 C), temperature source Axillary, resp. rate (!) 28, height 5' 3 (1.6 m), weight 60.8 kg, SpO2 99%.    FiO2 (%):  [35 %] 35 % PEEP:  [5 cmH20] 5 cmH20 Pressure Support:  [10 cmH20] 10 cmH20   Intake/Output Summary (Last 24 hours) at 11/05/2024 1250 Last data filed at 11/04/2024 1315 Gross per 24 hour  Intake 1250 ml  Output --  Net 1250 ml   Filed Weights   11/04/24 1453  Weight: 60.8 kg    Examination: Physical Exam Constitutional:      Appearance: She is ill-appearing.  Cardiovascular:     Rate and Rhythm: Regular rhythm. Tachycardia  present.     Pulses: Normal pulses.     Heart sounds: Normal heart sounds.  Pulmonary:     Effort: Respiratory distress present.     Breath sounds: Wheezing present. No rales.     Comments: Decreased breath sounds bilaterally Neurological:     General: No focal deficit present.     Mental Status: She is alert. Mental status is at baseline.      Assessment and Plan   #Acute on chronic hypoxic and hypercapnic respiratory failure #COPD exacerbation #Chronic atelectasis of the right middle lobe  Neuro - tylenol  for pain, avoid narcotics and benzos for the time being to maintain respiratory drive. Could resume low dose home benzodiazepine if remains stable by the end of today  CV - while there's a report of pulmonary hypertension in the chart, this was ruled out with right heart catheterization 03/2024. While wedge pressure was elevated at 20, she doesn't have classic signs of heart failure and management of this was deferred in the outpatient setting. Hypoxia could result in elevated PVR, especially in the setting of an exacerbation.  Pulm - her acute on chronic hypoxic respiratory failure is due to a COPD exacerbation, and further complicated by chronic atelectasis of the right middle lobe. Bronchoscopy failed to isolate an organism, and most recent repeat imaging shows the RML atelectasis to persist. This is a chronic finding for around 10 years, with evidence of it on CT chest from 04/2015 that I have personally reviewed.  Blood gas this admission showed mild respiratory acidosis, with acute on chronic hypercapnia, consistent with a COPD exacerbation. Steroid dose has been increased to 40 bid, and we will start broad spectrum antibiotics by initiating Cefepime and attempting to obtain respiratory cultures and a viral panel. She is maintained on an optimal outpatient regimen of ICS/LABA/LAMA Dory), biologics (On Dupilumab with peak eosinophil count of 400 09/2024) and suppressive  antibiotics (Azithromycin ). She is also on nocturnal BiPAP. She is being considered for Ensifentrine  by her outpatient pulmonologist.  She remains high risk for intubation and will be closely monitored in the ICU.  -continue methylpred 40 bid -standing duo-nebs every 4 hours -antibiotics as below -continue BiPAP -consider repeat CT to rule out PE if fails to improve (ruled out 11/25)  GI - NPO for now while on BiPAP  Renal - Kidney function at baseline  Hem/Onc - enoxaparin  for DVT prophylaxis  Endo - defer to primary team  ID - obtain respiratory cultures and viral panel. Broaden antibiotic coverage to include Cefepime. Will also check MRSA screen.  -Start Cefepime  -continue Doxycycline   -MRSA screen, consider anti-MRSA coverage  -respiratory cultures  Labs   CBC: Recent Labs  Lab 11/04/24 1132  WBC 20.2*  NEUTROABS 13.3*  HGB 13.2  HCT 43.1  MCV 88.9  PLT 639*    Basic Metabolic Panel: Recent Labs  Lab 11/04/24 1132  NA 144  K 5.0  CL 99  CO2 32  GLUCOSE 194*  BUN 25*  CREATININE 0.89  CALCIUM 10.7*   GFR: Estimated Creatinine Clearance: 48.7 mL/min (by C-G formula based on SCr of 0.89 mg/dL). Recent Labs  Lab 11/04/24 1132 11/04/24 1350  WBC 20.2*  --   LATICACIDVEN 2.5* 1.0  Liver Function Tests: Recent Labs  Lab 11/04/24 1132  AST 25  ALT 51*  ALKPHOS 138*  BILITOT 0.4  PROT 8.9*  ALBUMIN 4.4   No results for input(s): LIPASE, AMYLASE in the last 168 hours. No results for input(s): AMMONIA in the last 168 hours.  ABG    Component Value Date/Time   PHART 7.3 (L) 11/05/2024 1123   PCO2ART 58 (H) 11/05/2024 1123   PO2ART 111 (H) 11/05/2024 1123   HCO3 28.5 (H) 11/05/2024 1123   O2SAT 100 11/05/2024 1123     Coagulation Profile: No results for input(s): INR, PROTIME in the last 168 hours.  Cardiac Enzymes: No results for input(s): CKTOTAL, CKMB, CKMBINDEX, TROPONINI in the last 168  hours.  HbA1C: Hemoglobin A1C  Date/Time Value Ref Range Status  12/11/2023 04:01 PM 6.2 (A) 4.0 - 5.6 % Final   Hgb A1c MFr Bld  Date/Time Value Ref Range Status  05/22/2018 12:02 AM 6.2 (H) 4.8 - 5.6 % Final    Comment:    (NOTE) Pre diabetes:          5.7%-6.4% Diabetes:              >6.4% Glycemic control for   <7.0% adults with diabetes     CBG: No results for input(s): GLUCAP in the last 168 hours.  Review of Systems:   N/A  Past Medical History:  She,  has a past medical history of Bruises easily, Chronic respiratory failure with hypoxia (HCC), Cigarette smoker, Collapse of right lung, COPD (chronic obstructive pulmonary disease) (HCC), COVID-19 (07/2024), Dependence on supplemental oxygen , Depression with anxiety, Endometriosis, GERD (gastroesophageal reflux disease), Grade I diastolic dysfunction, History of sepsis, Hyperlipidemia, Hypertension (06/02/2012), Kidney stones, Pulmonary HTN (HCC), Reactive airway disease (03/08/2014), and Vitamin D  deficiency.   Surgical History:   Past Surgical History:  Procedure Laterality Date   BRONCHIAL WASHINGS N/A 09/17/2024   Procedure: IRRIGATION, BRONCHUS;  Surgeon: Parris Manna, MD;  Location: [facility] ORS;  Service: Thoracic;  Laterality: N/A;   CATARACT EXTRACTION W/PHACO Right 12/04/2023   Procedure: CATARACT EXTRACTION PHACO AND INTRAOCULAR LENS PLACEMENT (IOC) RIGHT  CLAREON VIVITY LENS;  Surgeon: Enola [name], MD;  Location: [facility];  Service: Ophthalmology;  Laterality: Right;  10.40 1:04.0   CATARACT EXTRACTION W/PHACO Left 12/18/2023   Procedure: CATARACT EXTRACTION PHACO AND INTRAOCULAR LENS PLACEMENT (IOC) LEFT  CLAREON VIVITY TORIC 16.28, 01:19.2;  Surgeon: Enola [name], MD;  Location: [facility];  Service: Ophthalmology;  Laterality: Left;   COLONOSCOPY WITH PROPOFOL  N/A 01/02/2023   Procedure: COLONOSCOPY WITH PROPOFOL ;  Surgeon: Unk Corinn Skiff, MD;  Location: [facility]  ENDOSCOPY;  Service: Gastroenterology;  Laterality: N/A;   ESOPHAGOGASTRODUODENOSCOPY N/A 07/29/2018   Procedure: ESOPHAGOGASTRODUODENOSCOPY (EGD);  Surgeon: Toledo, Ladell POUR, MD;  Location: [facility] ENDOSCOPY;  Service: Gastroenterology;  Laterality: N/A;   ESOPHAGOGASTRODUODENOSCOPY (EGD) WITH PROPOFOL  N/A 01/02/2023   Procedure: ESOPHAGOGASTRODUODENOSCOPY (EGD) WITH PROPOFOL ;  Surgeon: Unk Corinn Skiff, MD;  Location: [facility] ENDOSCOPY;  Service: Gastroenterology;  Laterality: N/A;   FLEXIBLE BRONCHOSCOPY N/A 09/17/2024   Procedure: BRONCHOSCOPY, FLEXIBLE;  Surgeon: Parris Manna, MD;  Location: [facility] ORS;  Service: Thoracic;  Laterality: N/A;   IR RADIOLOGIST EVAL & MGMT  10/19/2024   KIDNEY STONE SURGERY     RIGHT HEART CATH Right 04/21/2024   Procedure: RIGHT HEART CATH;  Surgeon: Ammon Blunt, MD;  Location: [facility] INVASIVE CV LAB;  Service: Cardiovascular;  Laterality: Right;   TONSILLECTOMY       Social History:  reports that she quit smoking about 10 months ago. Her smoking use included cigarettes. She started smoking about 50 years ago. She has a 26.8 pack-year smoking history. She has never used smokeless tobacco. She reports current alcohol use. She reports that she does not use drugs.   Family History:  Her family history includes GER disease in her father and mother; Heart attack in her father; Hyperlipidemia in her mother; Hypertension in her father and mother; Parkinson's disease in her father.   Allergies Allergies[1]   Home Medications  Prior to Admission medications  Medication Sig Start Date End Date Taking? Authorizing Provider  ALPRAZolam  (XANAX ) 0.25 MG tablet Take 1 tablet (0.25 mg total) by mouth 3 (three) times daily as needed for anxiety. 12/11/23  Yes Abernathy, Mardy, NP  ascorbic acid  (VITAMIN C ) 500 MG tablet Take 500 mg by mouth daily.   Yes [provider]  aspirin  81 MG tablet Take 81 mg by mouth at bedtime.    Yes [provider]   azithromycin  (ZITHROMAX ) 250 MG tablet Take 250 mg by mouth every other day. 10/06/24 11/14/24 Yes [provider]  BREZTRI AEROSPHERE 160-9-4.8 MCG/ACT AERO inhaler Inhale 2 puffs into the lungs 2 (two) times daily. 04/12/24  Yes [provider]  buPROPion  (WELLBUTRIN ) 75 MG tablet Take 75 mg by mouth 2 (two) times daily.   Yes [provider]  Cholecalciferol  25 MCG (1000 UT) tablet Take 1,000 Units by mouth daily.    Yes [provider]  Coenzyme Q10 (CO Q-10) 100 MG CAPS Take 100 mg by mouth daily.   Yes [provider]  DENTA 5000 PLUS 1.1 % CREA dental cream Take 1 application  by mouth 2 (two) times daily as needed. 04/14/21  Yes [provider]  dextromethorphan-guaiFENesin  (MUCINEX  DM) 30-600 MG 12hr tablet Take 1 tablet by mouth 2 (two) times daily as needed for cough. 10/02/24  Yes Caleen Qualia, MD  DULoxetine  (CYMBALTA ) 60 MG capsule Take 1 capsule (60 mg total) by mouth daily. 05/19/24  Yes Abernathy, Alyssa, NP  Dupilumab 300 MG/2ML SOAJ Inject 300 mg into the skin every 14 (fourteen) days. 01/27/24  Yes [provider]  EPINEPHrine  0.3 mg/0.3 mL IJ SOAJ injection Inject 0.3 mg into the muscle as needed. 01/27/24  Yes [provider]  famotidine  (PEPCID ) 40 MG tablet Take 1 tablet (40 mg total) by mouth 2 (two) times daily. 04/05/24  Yes Abernathy, Mardy, NP  fluticasone  (FLONASE ) 50 MCG/ACT nasal spray Place 2 sprays into both nostrils daily.   Yes [provider]  furosemide  (LASIX ) 40 MG tablet TAKE 1 TABLET BY MOUTH DAILY 04/12/24  Yes Abernathy, Alyssa, NP  gemfibrozil  (LOPID ) 600 MG tablet Take 1 tablet (600 mg total) by mouth 2 (two) times daily before a meal. 03/29/24  Yes Abernathy, Alyssa, NP  Ipratropium-Albuterol  (COMBIVENT  RESPIMAT) 20-100 MCG/ACT AERS respimat INHALE 1 PUFF INTO THE LUNGS EVERY 6 HOURS AS NEEDED FOR WHEEZING OR SHORTNESS OF BREATH 10/31/23  Yes Khan, Fozia M, MD  Magnesium  250 MG CAPS  Take 250 mg by mouth daily.   Yes [provider]  metoprolol  tartrate (LOPRESSOR ) 25 MG tablet Take 0.5 tablets (12.5 mg total) by mouth 2 (two) times daily. 10/02/24  Yes Amin, Sumayya, MD  Multiple Vitamins-Minerals (MULTIVITAMIN WITH MINERALS) tablet Take 1 tablet by mouth daily. Centrum Silver 50+   Yes [provider]  Potassium Chloride  CR (MICRO-K ) 8 MEQ CPCR capsule CR Take 1 capsule (8 mEq total) by mouth  daily. 10/26/23  Yes Khan, Fozia M, MD  predniSONE  (DELTASONE ) 5 MG tablet Take by mouth. Take 9 tablets (45 mg total) by mouth once daily for 5 days, THEN 8 tablets (40 mg total) once daily for 5 days, THEN 7 tablets (35 mg total) once daily for 5 days, THEN 6 tablets (30 mg total) once daily for 5 days, THEN 5 tablets (25 mg total) once daily for 5 days, THEN 4 tablets (20 mg total) once daily for 5 days, THEN 3 tablets (15 mg total) once daily for 5 days, THEN 2 tablets (10 mg total) once daily for 5 days, THEN 1 tablet (5 mg total) once daily for 5 days, THEN 0.5 tablets (2.5 mg total) once daily for 5 days. 10/06/24 11/25/24 Yes [provider]  sucralfate  (CARAFATE ) 1 g tablet Take 1 g by mouth 4 (four) times daily as needed (Stomach).   Yes [provider]  vitamin E  180 MG (400 UNITS) capsule Take 400 Units by mouth daily.   Yes [provider]  colchicine 0.6 MG tablet Take 0.6 mg by mouth daily. Patient not taking: Reported on 11/04/2024 09/13/24   [provider]  feeding supplement (ENSURE PLUS HIGH PROTEIN) LIQD Take 237 mLs by mouth 2 (two) times daily between meals. 10/03/24   Amin, Sumayya, MD  OXYGEN  Inhale 2 L into the lungs continuous. Continuous oxygen  and NIV at night, 2 liters   pt uses [facility] for oxygen  supplies    [provider]     The patient is critically ill due to acute on chronic hypoxic and hypercapnic respiratory failure, COPD exacerbation.  Critical care was necessary to treat or prevent imminent or  life-threatening deterioration. I personally performed non-invasive positive pressure ventilation management and titration and blood gas interpretation. Critical care time was spent by me on the following activities: development of a treatment plan with the patient and/or surrogate as well as nursing, discussions with consultants, evaluation of the patient's response to treatment, examination of the patient, obtaining a history from the patient or surrogate, ordering and performing treatments and interventions, ordering and review of laboratory studies, ordering and review of radiographic studies, review of telemetry data including pulse oximetry, re-evaluation of patient's condition and participation in multidisciplinary rounds.   I personally spent 46 minutes providing critical care not including any separately billable procedures.   Belva November, MD [facility] 11/05/2024 2:10 PM         [1]  Allergies Allergen Reactions   Naproxen Rash and Other (See Comments)    Spouse confirmed that pt breaks out in hives head to toe.    Sulfa Antibiotics Hives and Rash    Sulfa products.

## 2024-11-05 NOTE — Progress Notes (Signed)
 Pt was transferred from ED to ICU room 3 on the Servo on the documented settings with no issues or concerns during transport  pt continues to have increased WOB using her accessory muscles

## 2024-11-05 NOTE — Procedures (Incomplete)
 Intubation Procedure Note  ESCARLET SAATHOFF  [id number]  19-Feb-1956  Date:11/05/2024  Time:11:56 PM   Provider Performing:Alfonso Shackett A Susan Arana    Procedure: Intubation (31500)  Indication(s) Respiratory Failure  Consent Risks of the procedure as well as the alternatives and risks of each were explained to the patient and/or caregiver.  Consent for the procedure was obtained and is signed in the bedside chart   Anesthesia Etomidate and Rocuronium    Time Out Verified patient identification, verified procedure, site/side was marked, verified correct patient position, special equipment/implants available, medications/allergies/relevant history reviewed, required imaging and test results available.   Sterile Technique Usual hand hygeine, masks, and gloves were used   Procedure Description Patient positioned in bed supine.  Sedation given as noted above.  Patient was intubated with endotracheal tube using Glidescope.  View was Grade 1 full glottis .  Number of attempts was 1.  Colorimetric CO2 detector was consistent with tracheal placement.   Complications/Tolerance None; patient tolerated the procedure well. Chest X-ray is ordered to verify placement.   EBL Minimal   Specimen(s) None

## 2024-11-05 NOTE — Progress Notes (Signed)
 Called to bedside by ICU Provider. Orders for elective intubation for worsening status. Patient was intubated using a glidescope with a #3 blade, with a 7.5 ETT and secured at 23cm at the lip. Breath sounds equal. Positive color change on EtCO2. CXR ordered. Patient placed on mechanical ventilator. SpO2 99%. Will continue to monitor.

## 2024-11-05 NOTE — Hospital Course (Signed)
 HPI: Rachael Blankenship is a 68 y.o. female with medical history significant of COPD Gold stage IV, chronic hypoxic and hypercapnic respiratory failure on as needed oxygen  and nocturnal BiPAP, GERD, HTN, chronic HFpEF, pulmonary hypertension, HLD, presented with worsening of cough wheezing shortness of breath.   Symptoms started about 7 days ago when patient started to have a dry cough wheezing and increasing exertional dyspnea.  Denies any fever chills no chest pains.  Was started on additional albuterol  inhaler on Monday however patient did not tolerate albuterol  inhaler as she felt  palpitations and chest tightness as a result her breathing symptoms has progressively getting worse over the last 3 days.  No fever or chills.  Denied any ankle edema.  Family called EMS, EMS arrived and gave patient IV Solu-Medrol  DuoNebs hypoxic 71% on room air, tachycardia heart rate in the 100-1 20s, blood pressure elevated 160/100 acute, O2 saturation 71% on room air and stabilized on BiPAP with a setting of 16/8.  Chest x-ray negative for acute infiltrates.  Patient was given ceftriaxone  and azithromycin  daily.

## 2024-11-05 NOTE — ED Notes (Signed)
 This RN called to see status on the BREZTRI nebulizer from pharmacy.

## 2024-11-05 NOTE — ED Notes (Signed)
 BIPAP readjusted on pts face. Pt tolerated well and was able to follow commands

## 2024-11-05 NOTE — Progress Notes (Addendum)
 Progress Note   Patient: Rachael Blankenship [name]:[id number] DOB: 09/13/1956 DOA: 11/04/2024     1 DOS: the patient was seen and examined on 11/05/2024   Brief hospital course: HPI: Rachael Blankenship is a 68 y.o. female with medical history significant of COPD Gold stage IV, chronic hypoxic and hypercapnic respiratory failure on as needed oxygen  and nocturnal BiPAP, GERD, HTN, chronic HFpEF, pulmonary hypertension, HLD, presented with worsening of cough wheezing shortness of breath.   Symptoms started about 7 days ago when patient started to have a dry cough wheezing and increasing exertional dyspnea.  Denies any fever chills no chest pains.  Was started on additional albuterol  inhaler on Monday however patient did not tolerate albuterol  inhaler as she felt  palpitations and chest tightness as a result her breathing symptoms has progressively getting worse over the last 3 days.  No fever or chills.  Denied any ankle edema.  Family called EMS, EMS arrived and gave patient IV Solu-Medrol  DuoNebs hypoxic 71% on room air, tachycardia heart rate in the 100-1 20s, blood pressure elevated 160/100 acute, O2 saturation 71% on room air and stabilized on BiPAP with a setting of 16/8.  Chest x-ray negative for acute infiltrates.  Patient was given ceftriaxone  and azithromycin  daily.  Assessment and Plan: 68 year old female with history of COPD, chronic hypoxemic and hypercapnic respiratory failure on home oxygen  as needed, nocturnal BiPAP, GERD, HTN, chronic HFpEF, pulmonary hypertension, HLD who was admitted on 12/11 for acute on chronic hypoxemic and hypercapnic respiratory failure likely COPD exacerbation.  This morning patient is breathing like 30 times, wheezing accessory muscles of respiration mostly abdominal on BiPAP.  Her ABG does not look bad but the way she is breathing I believe she may tire out.  I have called pulmonary and critical care for consultation.  Acute on chronic hypoxic and hypercapnic  respiratory failure - Continue IV Solu-Medrol  40 mg twice daily - Continue antibiotics coverage, doxycycline  twice daily - Continue ICS and LABA - received 1 dose of IV Diamox 12/11 - Patient has poor tolerance of albuterol , when she developed tachycardia and accelerated blood pressure.  Continue Atrovent every 6 hours plus APRN Xopenex. -It appears that she takes Lasix  at home.  I will give her 1 dose of IV Lasix  and see how she does.  12/12 - Incentive spirometry -Continue on BiPAP - Discussed with patient in detail and she wants to be intubated if needed - Critical care evaluation for possible intubation   SIRS Leukocytosis - Patient has tachycardia and leukocytosis, likely secondary to COPD exacerbation.  Tachycardia likely secondary to albuterol 's side effect.  Doubt pneumonia or sepsis. - Antibiotics coverage for COPD exacerbation.   HTN Chronic HFpEF -Euvolemic, continue beta-blocker   Anxiety/depression - Mentation at baseline, continue Cymbalta    GERD - Controlled, continue PPI and Carafate .       Subjective: Shortness of breath, still on BiPAP  Physical Exam: Vitals:   11/05/24 0530 11/05/24 0735 11/05/24 0740 11/05/24 1100  BP: (!) 160/99  (!) 156/82 (!) 163/91  Pulse: 92  (!) 109 (!) 108  Resp: 17  20 (!) 22  Temp:   97.6 F (36.4 C)   TempSrc:   Axillary   SpO2: 100% 100% 100% 99%  Weight:      Height:       Constitutional: Alert, awake, severe respiratory distress on BiPAP HEENT: Neck supple, BiPAP present Respiratory: Severely decreased air entry on both bases, wheezes and rhonchi, use of accessory muscles including abdominal  muscles present Cardiovascular: Regular rate and rhythm, no murmurs / rubs / gallops. No extremity edema. 2+ pedal pulses. No carotid bruits.  Abdomen: Soft, no tenderness, Bowel sounds positive.  Musculoskeletal: no clubbing / cyanosis. Good ROM, no contractures. Normal muscle tone.  Skin: no rashes, lesions,  ulcers. Neurologic: CN 2-12 grossly intact. Sensation intact, No focal deficit identified Psychiatric: Alert and oriented x 3. Normal mood.    Data Reviewed:  Reviewed  Family Communication: Not available at the bedside  Disposition: Status is: Inpatient Remains inpatient appropriate because: Ongoing respiratory failure  Planned Discharge Destination: Home with Home Health    Time spent: 55 minutes  Author: Nena Rebel, MD 11/05/2024 12:22 PM  For on call review [email or web address].

## 2024-11-05 NOTE — Progress Notes (Signed)
 Per provider discontinue do not place external catheter order and put in order to place external catheter.

## 2024-11-05 NOTE — Procedures (Signed)
 INTUBATION PROCEDURE NOTE  Rachael Blankenship  [id number]  1956/07/31  Date:11/05/2024  Time: 2230  Provider Performing: Almarie A. Aylana Hirschfeld  Procedure: Intubation (31500)  Indication(s): Respiratory Failure  Consent: Risks of the procedure as well as the alternatives and risks of each were explained to the patient and/or caregiver. Consent for the procedure was obtained and is signed in the bedside chart.  Anesthesia: Etomidate  and Rocuronium   Time Out: Verified patient identification, verified procedure, site/side was marked, verified correct patient position, special equipment/implants available, medications/allergies/relevant history reviewed, required imaging and test results available.  Sterile Technique: Usual hand hygiene, masks, and gloves were used.  Procedure Description: Patient positioned supine in bed and pre-oxygenated. Sedation and neuromuscular blockade administered as noted above. Patient was intubated using a Glidescope with a #3 blade. View was Grade 1, full glottis. Number of attempts was 1. A 7.5 mm cuffed endotracheal tube was advanced through the vocal cords and secured at 23 cm at the lip. Colorimetric CO? detector was consistent with tracheal placement. Post-intubation SpO? was 99%. Post-intubation chest X-ray showed endotracheal tube tip approximately 4 cm above the carina in expected position with hyperinflated lungs.  Complications/Tolerance: None; patient tolerated the procedure well.  EBL: Minimal  Specimen(s): None    Almarie Nose DNP, CCRN, FNP-C, AGACNP-BC Acute Care & Family Nurse Practitioner [facility] PCCM on call pager [phone]

## 2024-11-05 NOTE — Progress Notes (Signed)
 Patient developed worsening respiratory distress with increased work of breathing and failure to maintain adequate ventilation despite noninvasive support. Decision was made to proceed with endotracheal intubation for acute on chronic respiratory failure.  Patient was intubated using a Glidescope with a #3 blade. A 7.5 mm endotracheal tube was successfully placed and secured at 23 cm at the lip. Correct placement was confirmed with bilateral and equal breath sounds and positive color change on end-tidal CO?SABRA Patient was placed on mechanical ventilation with post-intubation SpO? of 99%.  Post-intubation chest X-ray demonstrated the endotracheal tube tip projecting approximately 4 cm above the carina in expected position, with hyperinflated lungs.  Patient tolerated the procedure without immediate complications. Patients husband was notified of the change in clinical status and agreed with the plan of care, including intubation and mechanical ventilation. Will continue close monitoring and ventilator management.    Almarie Nose DNP, CCRN, FNP-C, AGACNP-BC Acute Care & Family Nurse Practitioner [facility] PCCM on call pager [phone]

## 2024-11-05 NOTE — ED Notes (Signed)
 RT and Hospitalist at bedside for assessment

## 2024-11-05 NOTE — Progress Notes (Signed)
 Patient with sudden onset of shortness of breath with increased work of breathing, tachypnea, and tachycardia; intencivist provider notified and immediately came to bedside.  Orders for ativan and morphine  received.  Per intencivist order for precidex if the ativan and morphine  is ineffective.  Will continue to monitor patient.

## 2024-11-05 NOTE — Plan of Care (Signed)
 Continuing with plan of care.

## 2024-11-05 NOTE — ED Notes (Signed)
 This patient was incontinent of urine. This NT provided peri care, a clean sheet, and brief. The patient stated no further needs at this time.

## 2024-11-05 NOTE — Progress Notes (Addendum)
 B/P was 176/86 (110) on repeat B/P is 168/90 (109), provider notified.  Order received for hydralazine  10mg  every 6 hours IV prn for systolic B/P >160.

## 2024-11-06 LAB — RENAL FUNCTION PANEL
Albumin: 3.5 g/dL (ref 3.5–5.0)
Anion gap: 11 (ref 5–15)
BUN: 41 mg/dL — ABNORMAL HIGH (ref 8–23)
CO2: 27 mmol/L (ref 22–32)
Calcium: 9 mg/dL (ref 8.9–10.3)
Chloride: 103 mmol/L (ref 98–111)
Creatinine, Ser: 0.66 mg/dL (ref 0.44–1.00)
GFR, Estimated: >60 mL/min (ref >60)
Glucose, Bld: 170 mg/dL — ABNORMAL HIGH (ref 70–99)
Phosphorus: 2.5 mg/dL (ref 2.5–4.6)
Potassium: 3.5 mmol/L (ref 3.5–5.1)
Sodium: 141 mmol/L (ref 135–145)

## 2024-11-06 LAB — CBC
HCT: 32.2 % — ABNORMAL LOW (ref 36.0–46.0)
Hemoglobin: 9.9 g/dL — ABNORMAL LOW (ref 12.0–15.0)
MCH: 27.6 pg (ref 26.0–34.0)
MCHC: 30.7 g/dL (ref 30.0–36.0)
MCV: 89.7 fL (ref 80.0–100.0)
Platelets: 442 K/uL — ABNORMAL HIGH (ref 150–400)
RBC: 3.59 MIL/uL — ABNORMAL LOW (ref 3.87–5.11)
RDW: 14.2 % (ref 11.5–15.5)
WBC: 16.5 K/uL — ABNORMAL HIGH (ref 4.0–10.5)
nRBC: 0 % (ref 0.0–0.2)

## 2024-11-06 LAB — BLOOD GAS, ARTERIAL
Acid-Base Excess: 3.5 mmol/L — ABNORMAL HIGH (ref 0.0–2.0)
Bicarbonate: 32.4 mmol/L — ABNORMAL HIGH (ref 20.0–28.0)
FIO2: 45 %
MECHVT: 400 mL
Mechanical Rate: 16
O2 Saturation: 98.8 %
PEEP: 5 cmH2O
Patient temperature: 37
pCO2 arterial: 69 mmHg (ref 32–48)
pH, Arterial: 7.28 — ABNORMAL LOW (ref 7.35–7.45)
pO2, Arterial: 98 mmHg (ref 83–108)

## 2024-11-06 LAB — GLUCOSE, CAPILLARY
Glucose-Capillary: 161 mg/dL — ABNORMAL HIGH (ref 70–99)
Glucose-Capillary: 140 mg/dL — ABNORMAL HIGH (ref 70–99)
Glucose-Capillary: 144 mg/dL — ABNORMAL HIGH (ref 70–99)
Glucose-Capillary: 176 mg/dL — ABNORMAL HIGH (ref 70–99)

## 2024-11-06 MED ORDER — METOPROLOL TARTRATE 25 MG PO TABS
12.5000 mg | ORAL_TABLET | Freq: Two times a day (BID) | ORAL | Status: DC
  Administered 2024-11-06 – 2024-11-08 (×4): 12.5 mg
  Filled 2024-11-06 (×4): qty 1

## 2024-11-06 MED ORDER — PROPOFOL 1000 MG/100ML IV EMUL
0.0000 ug/kg/min | INTRAVENOUS | Status: DC
  Administered 2024-11-06: 20 ug/kg/min via INTRAVENOUS
  Administered 2024-11-06: 60 ug/kg/min via INTRAVENOUS
  Administered 2024-11-06 – 2024-11-07 (×2): 50 ug/kg/min via INTRAVENOUS
  Administered 2024-11-07: 70 ug/kg/min via INTRAVENOUS
  Administered 2024-11-07: 50 ug/kg/min via INTRAVENOUS
  Administered 2024-11-08: 02:00:00 35 ug/kg/min via INTRAVENOUS
  Filled 2024-11-06 (×9): qty 100

## 2024-11-06 MED ORDER — PANTOPRAZOLE SODIUM 40 MG IV SOLR
40.0000 mg | Freq: Every day | INTRAVENOUS | Status: DC
  Administered 2024-11-06 – 2024-11-14 (×9): 40 mg via INTRAVENOUS
  Filled 2024-11-06 (×9): qty 10

## 2024-11-06 MED ORDER — NOREPINEPHRINE 4 MG/250ML-% IV SOLN
0.0000 ug/min | INTRAVENOUS | Status: DC
  Administered 2024-11-06: 4 ug/min via INTRAVENOUS
  Filled 2024-11-06 (×2): qty 250

## 2024-11-06 MED ORDER — POTASSIUM CHLORIDE 20 MEQ PO PACK
40.0000 meq | PACK | ORAL | Status: AC
  Administered 2024-11-06 (×2): 40 meq
  Filled 2024-11-06 (×2): qty 2

## 2024-11-06 MED ORDER — VITAL HP 1.0 CAL PO LIQD
1000.0000 mL | ORAL | Status: DC
  Administered 2024-11-06 – 2024-11-07 (×2): 1000 mL

## 2024-11-06 MED ORDER — POLYETHYLENE GLYCOL 3350 17 G PO PACK: 17.0000 g | PACK | Freq: Every day | ORAL | Status: DC

## 2024-11-06 MED ORDER — ONDANSETRON HCL 4 MG PO TABS: 4.0000 mg | ORAL_TABLET | Freq: Four times a day (QID) | ORAL | Status: DC | PRN

## 2024-11-06 MED ORDER — ACETAMINOPHEN 325 MG PO TABS
650.0000 mg | ORAL_TABLET | Freq: Four times a day (QID) | ORAL | Status: DC | PRN
  Administered 2024-11-11: 10:00:00 650 mg
  Filled 2024-11-06: qty 2

## 2024-11-06 MED ORDER — ONDANSETRON HCL 4 MG/2ML IJ SOLN: 4.0000 mg | Freq: Four times a day (QID) | INTRAMUSCULAR | Status: DC | PRN

## 2024-11-06 MED ORDER — MIDAZOLAM HCL (PF) 2 MG/2ML IJ SOLN
2.0000 mg | INTRAMUSCULAR | Status: DC | PRN
  Administered 2024-11-06 (×2): 2 mg via INTRAVENOUS
  Filled 2024-11-06 (×2): qty 2

## 2024-11-06 MED ORDER — ORAL CARE MOUTH RINSE
15.0000 mL | OROMUCOSAL | Status: DC | PRN
  Administered 2024-11-09: 18:00:00 15 mL via OROMUCOSAL

## 2024-11-06 MED ORDER — DOCUSATE SODIUM 50 MG/5ML PO LIQD
100.0000 mg | Freq: Two times a day (BID) | ORAL | Status: DC
  Administered 2024-11-06: 100 mg
  Filled 2024-11-06: qty 10

## 2024-11-06 MED ORDER — ACETAMINOPHEN 650 MG RE SUPP: 650.0000 mg | Freq: Four times a day (QID) | RECTAL | Status: DC | PRN

## 2024-11-06 MED ORDER — ORAL CARE MOUTH RINSE
15.0000 mL | OROMUCOSAL | Status: DC
  Administered 2024-11-06 – 2024-11-09 (×36): 15 mL via OROMUCOSAL

## 2024-11-06 MED ORDER — INSULIN ASPART 100 UNIT/ML IJ SOLN
1.0000 [IU] | INTRAMUSCULAR | Status: DC
  Administered 2024-11-06 (×2): 2 [IU] via SUBCUTANEOUS
  Administered 2024-11-06 – 2024-11-07 (×3): 1 [IU] via SUBCUTANEOUS
  Administered 2024-11-07 (×4): 2 [IU] via SUBCUTANEOUS
  Administered 2024-11-07 – 2024-11-08 (×2): 1 [IU] via SUBCUTANEOUS
  Administered 2024-11-08 (×2): 2 [IU] via SUBCUTANEOUS
  Administered 2024-11-08 – 2024-11-09 (×2): 1 [IU] via SUBCUTANEOUS
  Administered 2024-11-09: 08:00:00 2 [IU] via SUBCUTANEOUS
  Administered 2024-11-09: 16:00:00 1 [IU] via SUBCUTANEOUS
  Administered 2024-11-09: 20:00:00 2 [IU] via SUBCUTANEOUS
  Administered 2024-11-10 – 2024-11-11 (×2): 1 [IU] via SUBCUTANEOUS
  Administered 2024-11-11: 21:00:00 2 [IU] via SUBCUTANEOUS
  Administered 2024-11-11: 11:00:00 1 [IU] via SUBCUTANEOUS
  Administered 2024-11-11: 17:00:00 3 [IU] via SUBCUTANEOUS
  Administered 2024-11-12: 1 [IU] via SUBCUTANEOUS
  Administered 2024-11-12 (×2): 2 [IU] via SUBCUTANEOUS
  Administered 2024-11-12: 1 [IU] via SUBCUTANEOUS
  Administered 2024-11-12 – 2024-11-13 (×3): 2 [IU] via SUBCUTANEOUS
  Administered 2024-11-13: 3 [IU] via SUBCUTANEOUS
  Administered 2024-11-13 – 2024-11-14 (×3): 2 [IU] via SUBCUTANEOUS
  Administered 2024-11-14: 1 [IU] via SUBCUTANEOUS
  Administered 2024-11-14: 3 [IU] via SUBCUTANEOUS
  Filled 2024-11-06 (×3): qty 2
  Filled 2024-11-06 (×7): qty 1
  Filled 2024-11-06: qty 3
  Filled 2024-11-06 (×2): qty 2
  Filled 2024-11-06 (×2): qty 1
  Filled 2024-11-06 (×3): qty 2
  Filled 2024-11-06: qty 1
  Filled 2024-11-06: qty 3
  Filled 2024-11-06: qty 2
  Filled 2024-11-06 (×2): qty 1
  Filled 2024-11-06: qty 2
  Filled 2024-11-06 (×2): qty 1
  Filled 2024-11-06 (×2): qty 2
  Filled 2024-11-06 (×2): qty 1
  Filled 2024-11-06 (×5): qty 2

## 2024-11-06 NOTE — Progress Notes (Signed)
 NAME:  Rachael Blankenship, MRN:  [id number], DOB:  1956/08/11, LOS: 2 ADMISSION DATE:  11/04/2024, CHIEF COMPLAINT:  Respiratory Failure   History of Present Illness:  Patient is a 68 year old female with a history of smoking and COPD with chronic hypoxic and hypercapnic respiratory failure who presents to the hospital with increased shortness of breath, cough, and wheezing consistent with a COPD exacerbation.   Patient reports symptom onset around 7 days ago with worsening shortness of breath, cough, wheezing, and increased exertional dyspnea.  She reports having increased oxygen  requirements and inability to walk or move short distances without having significant desaturation.  This further worsened over this week and she was unable to wait until her scheduled appointment with her primary pulmonologist prompting her to present to the ED.   EMS was activated and she was brought in with hypoxia and increased work of breathing.  She received IV steroids and was started on BiPAP for respiratory distress and admitted to the medicine service for further workup and management.  Patient started on IV Solu-Medrol , doxycycline , and received a dose of acetazolamide .  Given lack of improvement today, pulmonary and critical care medicine were consulted for increased risk of respiratory failure.   Medical chart reviewed, patient had a history of smoking and she quit 1 year ago.  Patient has been followed by outpatient pulmonary for severe COPD with her last FEV1 at 0.61 L (29% predicted).  She has chronic hypoxia for which she was started on supplemental oxygen .   Patient was admitted to the hospital in January 2025 with acute on chronic hypoxic respiratory failure with ICU admission.  She was continued on nocturnal BiPAP after send admission and switched to triple therapy with Breztri  and Ensifentrine  was considered.  She was started on Dupixent to reduce frequency of COPD exacerbations early in 2025.  She was  further referred to cardiology for consideration of a right heart catheterization for workup of pulmonary hypertension.   Right heart cath 04/21/2024 showed RA 10/6, PA 40/12 (24), and PCWP of 20 not indicative of pulmonary hypertension.   CT chest ruled out PE in October 2025 after an exacerbation.  This was notable for unchanged complete collapse of the right middle lobe with atelectasis in the lingula.  She then underwent bronchoscopy with BAL of the right middle lobe with cultures returning negative.   Patient then admitted in November 2025 with COPD exacerbation treated with antibiotics and discharged on levofloxacin  as well as a prednisone  taper.  Patient seen by her pulmonologist in early November where her prednisone  taper was extended (taper by 5 mg every 5 days).  She was also started on thrice weekly azithromycin .  Pertinent  Medical History  -Severe COPD -Chronic hypoxic respiratory failure  Significant Hospital Events: Including procedures, antibiotic start and stop dates in addition to other pertinent events   11/04/24: Admitted with COPD exacerbation, on BiPAP 11/05/24: Consult to critical care for respiratory failure and risk of decompensation and need for intubation and mechanical ventilation. Intubated at night 11/06/24: On dexmedetomidine  and fentanyl  for analgesia and sedation. Synchronous with the vent  Interim History / Subjective:  Intubated and sedated  Objective    Blood pressure (!) 98/58, pulse 93, temperature 98.4 F (36.9 C), temperature source Oral, resp. rate 16, height 5' 3 (1.6 m), weight 62.6 kg, SpO2 94%.    Vent Mode: PRVC FiO2 (%):  [35 %-60 %] 35 % Set Rate:  [16 bmp] 16 bmp Vt Set:  [400 mL] 400 mL  PEEP:  [5 cmH20] 5 cmH20 Pressure Support:  [10 cmH20] 10 cmH20   Intake/Output Summary (Last 24 hours) at 11/06/2024 0828 Last data filed at 11/06/2024 0600 Gross per 24 hour  Intake 1382.98 ml  Output 1300 ml  Net 82.98 ml   Filed Weights    11/04/24 1453 11/05/24 1411  Weight: 60.8 kg 62.6 kg    Examination: Physical Exam Constitutional:      General: She is not in acute distress.    Appearance: She is ill-appearing.  Cardiovascular:     Rate and Rhythm: Regular rhythm. Tachycardia present.     Pulses: Normal pulses.     Heart sounds: Normal heart sounds.  Pulmonary:     Breath sounds: Wheezing present.  Abdominal:     Palpations: Abdomen is soft.  Musculoskeletal:     Right lower leg: No edema.     Left lower leg: No edema.  Neurological:     Mental Status: She is disoriented.     Comments: Sedated, vented     Assessment and Plan   #Acute on chronic hypoxic and hypercapnic respiratory failure #COPD exacerbation #Chronic atelectasis of the right middle lobe  Neuro - Intubated overnight, now on dexmedetomidine  and fentanyl  to maintain RASS goal of -1 and CPOT <2 -Daily wake up assessment -continue tylenol  for pain  CV - while there's a report of pulmonary hypertension in the chart, this was ruled out with right heart catheterization 03/2024. Wedge pressure was elevated at 20, but she doesn't have classic signs of heart failure and management of this was deferred in the outpatient setting. Hypoxia could result in elevated PVR, especially in the setting of an exacerbation.   Pulm - her acute on chronic hypoxic respiratory failure is due to a COPD exacerbation, and further complicated by chronic atelectasis of the right middle lobe. Bronchoscopy failed to isolate an organism with BAL cultures returning negative. Most recent repeat imaging shows the RML atelectasis to persist. This is a chronic finding for around 10 years, with evidence of it on CT chest from 04/2015 that I have personally reviewed.   Blood gas this admission showed mild respiratory acidosis, with acute on chronic hypercapnia, consistent with a COPD exacerbation. Steroid dose has been increased to 40 bid yesterday and her antibiotic coverage was  broadened. She is maintained on an optimal outpatient regimen of ICS/LABA/LAMA (Breztri ), biologics (On Dupilumab with peak eosinophil count of 400 09/2024) and suppressive antibiotics (Azithromycin ). She is also on nocturnal BiPAP. She is being considered for Ensifentrine  by her outpatient pulmonologist.  She was intubated overnight secondary to increased work of breathing. I suspect she developed breath stacking resulting in her worsening respiratory failure. She is improved now that she is on mechanical ventilation.   -continue methylpred 40 bid -standing duo-nebs every 4 hours -antibiotics as below -consider repeat CT to rule out PE if fails to improve (last ruled out 09/30/24)   GI - Place OG, start PPI for SUP, tube feeds   Renal - Kidney function at baseline. Replete electrolytes as indicated   Hem/Onc - enoxaparin  for DVT prophylaxis   Endo - ICU glycemic protocol   ID - obtain respiratory cultures and viral panel. Broaden antibiotic coverage to include Cefepime . MRSA screen negative             -Start Cefepime              -continue Doxycycline              -respiratory cultures  -  full viral panel  -will consider bronchoscopy depending on clinical course  Labs   CBC: Recent Labs  Lab 11/04/24 1132 11/05/24 1212 11/06/24 0549  WBC 20.2* 19.1* 16.5*  NEUTROABS 13.3*  --   --   HGB 13.2 11.6* 9.9*  HCT 43.1 38.4 32.2*  MCV 88.9 89.3 89.7  PLT 639* 539* 442*    Basic Metabolic Panel: Recent Labs  Lab 11/04/24 1132 11/05/24 1212 11/06/24 0549  NA 144 143 141  K 5.0 4.2 3.5  CL 99 103 103  CO2 32 24 27  GLUCOSE 194* 135* 170*  BUN 25* 26* 41*  CREATININE 0.89 0.67 0.66  CALCIUM 10.7* 9.6 9.0  PHOS  --  2.6 2.5   GFR: Estimated Creatinine Clearance: 54.1 mL/min (by C-G formula based on SCr of 0.66 mg/dL). Recent Labs  Lab 11/04/24 1132 11/04/24 1350 11/05/24 1212 11/06/24 0549  PROCALCITON  --   --  0.24  --   WBC 20.2*  --  19.1* 16.5*  LATICACIDVEN  2.5* 1.0  --   --     Liver Function Tests: Recent Labs  Lab 11/04/24 1132 11/05/24 1212 11/06/24 0549  AST 25  --   --   ALT 51*  --   --   ALKPHOS 138*  --   --   BILITOT 0.4  --   --   PROT 8.9*  --   --   ALBUMIN 4.4 4.2 3.5   No results for input(s): LIPASE, AMYLASE in the last 168 hours. No results for input(s): AMMONIA in the last 168 hours.  ABG    Component Value Date/Time   PHART 7.28 (L) 11/06/2024 0045   PCO2ART 69 (HH) 11/06/2024 0045   PO2ART 98 11/06/2024 0045   HCO3 32.4 (H) 11/06/2024 0045   O2SAT 98.8 11/06/2024 0045     Coagulation Profile: No results for input(s): INR, PROTIME in the last 168 hours.  Cardiac Enzymes: No results for input(s): CKTOTAL, CKMB, CKMBINDEX, TROPONINI in the last 168 hours.  HbA1C: Hemoglobin A1C  Date/Time Value Ref Range Status  12/11/2023 04:01 PM 6.2 (A) 4.0 - 5.6 % Final   Hgb A1c MFr Bld  Date/Time Value Ref Range Status  05/22/2018 12:02 AM 6.2 (H) 4.8 - 5.6 % Final    Comment:    (NOTE) Pre diabetes:          5.7%-6.4% Diabetes:              >6.4% Glycemic control for   <7.0% adults with diabetes     CBG: Recent Labs  Lab 11/05/24 1413  GLUCAP 141*    Review of Systems:   N/A  Past Medical History:  She,  has a past medical history of Bruises easily, Chronic respiratory failure with hypoxia (HCC), Cigarette smoker, Collapse of right lung, COPD (chronic obstructive pulmonary disease) (HCC), COVID-19 (07/2024), Dependence on supplemental oxygen , Depression with anxiety, Endometriosis, GERD (gastroesophageal reflux disease), Grade I diastolic dysfunction, History of sepsis, Hyperlipidemia, Hypertension (06/02/2012), Kidney stones, Pulmonary HTN (HCC), Reactive airway disease (03/08/2014), and Vitamin D  deficiency.   Surgical History:   Past Surgical History:  Procedure Laterality Date   BRONCHIAL WASHINGS N/A 09/17/2024   Procedure: IRRIGATION, BRONCHUS;  Surgeon: Parris Manna, MD;  Location: [facility] ORS;  Service: Thoracic;  Laterality: N/A;   CATARACT EXTRACTION W/PHACO Right 12/04/2023   Procedure: CATARACT EXTRACTION PHACO AND INTRAOCULAR LENS PLACEMENT (IOC) RIGHT  CLAREON VIVITY LENS;  Surgeon: Enola [name], MD;  Location: [facility];  Service: Ophthalmology;  Laterality: Right;  10.40 1:04.0   CATARACT EXTRACTION W/PHACO Left 12/18/2023   Procedure: CATARACT EXTRACTION PHACO AND INTRAOCULAR LENS PLACEMENT (IOC) LEFT  CLAREON VIVITY TORIC 16.28, 01:19.2;  Surgeon: Enola [name], MD;  Location: [facility];  Service: Ophthalmology;  Laterality: Left;   COLONOSCOPY WITH PROPOFOL  N/A 01/02/2023   Procedure: COLONOSCOPY WITH PROPOFOL ;  Surgeon: Unk Corinn Skiff, MD;  Location: [facility] ENDOSCOPY;  Service: Gastroenterology;  Laterality: N/A;   ESOPHAGOGASTRODUODENOSCOPY N/A 07/29/2018   Procedure: ESOPHAGOGASTRODUODENOSCOPY (EGD);  Surgeon: Toledo, Ladell POUR, MD;  Location: [facility] ENDOSCOPY;  Service: Gastroenterology;  Laterality: N/A;   ESOPHAGOGASTRODUODENOSCOPY (EGD) WITH PROPOFOL  N/A 01/02/2023   Procedure: ESOPHAGOGASTRODUODENOSCOPY (EGD) WITH PROPOFOL ;  Surgeon: Unk Corinn Skiff, MD;  Location: [facility] ENDOSCOPY;  Service: Gastroenterology;  Laterality: N/A;   FLEXIBLE BRONCHOSCOPY N/A 09/17/2024   Procedure: BRONCHOSCOPY, FLEXIBLE;  Surgeon: Parris Manna, MD;  Location: [facility] ORS;  Service: Thoracic;  Laterality: N/A;   IR RADIOLOGIST EVAL & MGMT  10/19/2024   KIDNEY STONE SURGERY     RIGHT HEART CATH Right 04/21/2024   Procedure: RIGHT HEART CATH;  Surgeon: Ammon Blunt, MD;  Location: [facility] INVASIVE CV LAB;  Service: Cardiovascular;  Laterality: Right;   TONSILLECTOMY       Social History:   reports that she quit smoking about 10 months ago. Her smoking use included cigarettes. She started smoking about 50 years ago. She has a 26.8 pack-year smoking history. She has never used smokeless tobacco. She reports current  alcohol use. She reports that she does not use drugs.   Family History:  Her family history includes GER disease in her father and mother; Heart attack in her father; Hyperlipidemia in her mother; Hypertension in her father and mother; Parkinson's disease in her father.   Allergies Allergies[1]   Home Medications  Prior to Admission medications  Medication Sig Start Date End Date Taking? Authorizing Provider  ALPRAZolam  (XANAX ) 0.25 MG tablet Take 1 tablet (0.25 mg total) by mouth 3 (three) times daily as needed for anxiety. 12/11/23  Yes Abernathy, Mardy, NP  ascorbic acid  (VITAMIN C ) 500 MG tablet Take 500 mg by mouth daily.   Yes [provider]  aspirin  81 MG tablet Take 81 mg by mouth at bedtime.    Yes [provider]  azithromycin  (ZITHROMAX ) 250 MG tablet Take 250 mg by mouth every other day. 10/06/24 11/14/24 Yes [provider]  BREZTRI  AEROSPHERE 160-9-4.8 MCG/ACT AERO inhaler Inhale 2 puffs into the lungs 2 (two) times daily. 04/12/24  Yes [provider]  buPROPion  (WELLBUTRIN ) 75 MG tablet Take 75 mg by mouth 2 (two) times daily.   Yes [provider]  Cholecalciferol  25 MCG (1000 UT) tablet Take 1,000 Units by mouth daily.    Yes [provider]  Coenzyme Q10 (CO Q-10) 100 MG CAPS Take 100 mg by mouth daily.   Yes [provider]  DENTA 5000 PLUS 1.1 % CREA dental cream Take 1 application  by mouth 2 (two) times daily as needed. 04/14/21  Yes [provider]  dextromethorphan-guaiFENesin  (MUCINEX  DM) 30-600 MG 12hr tablet Take 1 tablet by mouth 2 (two) times daily as needed for cough. 10/02/24  Yes Caleen Qualia, MD  DULoxetine  (CYMBALTA ) 60 MG capsule Take 1 capsule (60 mg total) by mouth daily. 05/19/24  Yes Abernathy, Alyssa, NP  Dupilumab 300 MG/2ML SOAJ Inject 300 mg into the skin every 14 (fourteen) days. 01/27/24  Yes [provider]  EPINEPHrine  0.3  mg/0.3 mL IJ SOAJ injection Inject 0.3 mg into  the muscle as needed. 01/27/24  Yes [provider]  famotidine  (PEPCID ) 40 MG tablet Take 1 tablet (40 mg total) by mouth 2 (two) times daily. 04/05/24  Yes Abernathy, Mardy, NP  fluticasone  (FLONASE ) 50 MCG/ACT nasal spray Place 2 sprays into both nostrils daily.   Yes [provider]  furosemide  (LASIX ) 40 MG tablet TAKE 1 TABLET BY MOUTH DAILY 04/12/24  Yes Abernathy, Mardy, NP  gemfibrozil  (LOPID ) 600 MG tablet Take 1 tablet (600 mg total) by mouth 2 (two) times daily before a meal. 03/29/24  Yes Abernathy, Alyssa, NP  Ipratropium-Albuterol  (COMBIVENT  RESPIMAT) 20-100 MCG/ACT AERS respimat INHALE 1 PUFF INTO THE LUNGS EVERY 6 HOURS AS NEEDED FOR WHEEZING OR SHORTNESS OF BREATH 10/31/23  Yes Khan, Fozia M, MD  Magnesium  250 MG CAPS Take 250 mg by mouth daily.   Yes [provider]  metoprolol  tartrate (LOPRESSOR ) 25 MG tablet Take 0.5 tablets (12.5 mg total) by mouth 2 (two) times daily. 10/02/24  Yes Amin, Sumayya, MD  Multiple Vitamins-Minerals (MULTIVITAMIN WITH MINERALS) tablet Take 1 tablet by mouth daily. Centrum Silver 50+   Yes [provider]  Potassium Chloride  CR (MICRO-K ) 8 MEQ CPCR capsule CR Take 1 capsule (8 mEq total) by mouth daily. 10/26/23  Yes Khan, Fozia M, MD  predniSONE  (DELTASONE ) 5 MG tablet Take by mouth. Take 9 tablets (45 mg total) by mouth once daily for 5 days, THEN 8 tablets (40 mg total) once daily for 5 days, THEN 7 tablets (35 mg total) once daily for 5 days, THEN 6 tablets (30 mg total) once daily for 5 days, THEN 5 tablets (25 mg total) once daily for 5 days, THEN 4 tablets (20 mg total) once daily for 5 days, THEN 3 tablets (15 mg total) once daily for 5 days, THEN 2 tablets (10 mg total) once daily for 5 days, THEN 1 tablet (5 mg total) once daily for 5 days, THEN 0.5 tablets (2.5 mg total) once daily for 5 days. 10/06/24 11/25/24 Yes [provider]  sucralfate  (CARAFATE ) 1 g tablet Take 1 g by mouth 4 (four) times daily as  needed (Stomach).   Yes [provider]  vitamin E  180 MG (400 UNITS) capsule Take 400 Units by mouth daily.   Yes [provider]  colchicine 0.6 MG tablet Take 0.6 mg by mouth daily. Patient not taking: Reported on 11/04/2024 09/13/24   [provider]  feeding supplement (ENSURE PLUS HIGH PROTEIN) LIQD Take 237 mLs by mouth 2 (two) times daily between meals. 10/03/24   Amin, Sumayya, MD  OXYGEN  Inhale 2 L into the lungs continuous. Continuous oxygen  and NIV at night, 2 liters   pt uses [facility] for oxygen  supplies    [provider]     The patient is critically ill due to acute hypoxic and hypercapnic respiratory failure, COPD exacerbation.  Critical care was necessary to treat or prevent imminent or life-threatening deterioration. I personally performed high risk medication and infusion titration and management, titration, monitoring, and management of vasopressor/ionotrope infusion, and mechanical ventilation management and titration. Critical care time was spent by me on the following activities: development of a treatment plan with the patient and/or surrogate as well as nursing, discussions with consultants, evaluation of the patient's response to treatment, examination of the patient, obtaining a history from the patient or surrogate, ordering and performing treatments and interventions, ordering and review of laboratory studies, ordering and review  of radiographic studies, review of telemetry data including pulse oximetry, re-evaluation of patient's condition and participation in multidisciplinary rounds.   I personally spent 43 minutes providing critical care not including any separately billable procedures.   Belva November, MD [facility] 11/06/2024 8:51 AM                [1]  Allergies Allergen Reactions   Naproxen Rash and Other (See Comments)    Spouse confirmed that pt breaks out in hives head to toe.    Sulfa  Antibiotics Hives and Rash    Sulfa products.

## 2024-11-06 NOTE — Progress Notes (Signed)
 Order received to discontinue regular diet and make patient NPO.

## 2024-11-06 NOTE — Procedures (Signed)
 ARTERIAL CATHETER INSERTION PROCEDURE NOTE  Rachael Blankenship  [id number]  1956/10/09  Date:11/05/24 Time:11:l25PM   Provider Performing: Almarie DELENA Nose   Procedure: Right radial arterial line insertion  Indication(s): Continuous blood pressure monitoring, frequent arterial blood gas sampling, and hemodynamic monitoring  Consent: Risks, benefits, and alternatives of the procedure, including potential complications such as bleeding, hematoma, infection, thrombosis, and arterial injury, were explained to the patient and/or caregiver. Informed consent was obtained and documented in the bedside chart.  Anesthesia: Local anesthetic (1-2% lidocaine ) administered at the insertion site.  Time Out: Patient identity verified, procedure verified, site marked, correct patient position confirmed, all necessary equipment available, medications/allergies/relevant history reviewed, and required imaging/test results confirmed.  Sterile Technique: Full sterile barrier precautions used including hand hygiene, mask, sterile gloves, and large sterile drape over the patients arm.  Procedure Description: Patient positioned supine with the right wrist slightly extended. The right radial artery was palpated and prepared with antiseptic solution. Local anesthesia administered. Using a sterile technique, a needle was inserted into the radial artery, and blood return confirmed. A guidewire was advanced, and the arterial catheter was threaded over the wire into the artery. Catheter secured with sterile dressings and tape. Continuous waveform and blood return confirmed proper placement.  Confirmation of Placement: Patency confirmed by arterial waveform and ability to draw blood. Site checked for hemostasis and adequate distal perfusion.  Complications/Tolerance: None; patient tolerated the procedure well.  EBL: Minimal  Specimens: ABG     Almarie Nose DNP, CCRN, FNP-C, AGACNP-BC Acute Care & Family  Nurse Practitioner [facility] PCCM on call pager [phone]

## 2024-11-06 NOTE — Plan of Care (Signed)
 Continuing with plan of care.

## 2024-11-06 NOTE — Procedures (Signed)
 CENTRAL VENOUS CATHETER INSERTION PROCEDURE NOTE  Rachael Blankenship  [id number]  Apr 27, 1956  Date:11/04/24 Time:11:30PM  Provider Performing:Johan Creveling A Aisa Schoeppner   Procedure: Central Venous Catheter Insertion - Right Internal Jugular Vein  Indication(s): Need for central venous access for medication administration, hemodynamic monitoring, and fluid management  Consent: Risks, benefits, and alternatives of the procedure, including potential complications such as infection, bleeding, pneumothorax, and arterial puncture, were explained to the patient and/or caregiver. Informed consent was obtained and documented in the bedside chart.  Anesthesia: Local anesthetic (lidocaine  1-2%) administered at the insertion site.  Time Out: Patient identity verified, procedure verified, site marked, correct patient position confirmed, all necessary equipment available, medications/allergies/relevant history reviewed, and required imaging/test results confirmed.  Sterile Technique: Full sterile barrier precautions used to include hand hygiene, mask, cap, sterile gown, gloves, and large sterile drape over the patient.  Procedure Description: Patient positioned supine with slight Trendelenburg. Right internal jugular vein identified using ultrasound guidance. Local anesthesia administered. A needle was advanced into the vein under ultrasound visualization, and venous blood return confirmed. A guidewire was inserted, followed by dilation and placement of a central venous catheter. Catheter secured at the appropriate depth. Color, waveform, and blood aspiration confirmed venous placement.  Confirmation of Placement: Post-procedure chest X-ray confirmed: Right internal jugular central venous catheter tip in expected region of the superior vena cava  Complications/Tolerance: None; patient tolerated the procedure well.  EBL: Minimal  Specimens: None    Rachael Nose DNP, CCRN, FNP-C, AGACNP-BC Acute Care  & Family Nurse Practitioner [facility] on call pager [phone]

## 2024-11-07 LAB — RESPIRATORY PANEL BY PCR

## 2024-11-07 LAB — RENAL FUNCTION PANEL
Albumin: 3.3 g/dL — ABNORMAL LOW (ref 3.5–5.0)
Anion gap: 7 (ref 5–15)
BUN: 47 mg/dL — ABNORMAL HIGH (ref 8–23)
CO2: 28 mmol/L (ref 22–32)
Calcium: 8.6 mg/dL — ABNORMAL LOW (ref 8.9–10.3)
Chloride: 109 mmol/L (ref 98–111)
Creatinine, Ser: 0.59 mg/dL (ref 0.44–1.00)
GFR, Estimated: >60 mL/min (ref >60)
Glucose, Bld: 204 mg/dL — ABNORMAL HIGH (ref 70–99)
Phosphorus: 2 mg/dL — ABNORMAL LOW (ref 2.5–4.6)
Potassium: 4.6 mmol/L (ref 3.5–5.1)
Sodium: 144 mmol/L (ref 135–145)

## 2024-11-07 LAB — CBC
HCT: 29.7 % — ABNORMAL LOW (ref 36.0–46.0)
Hemoglobin: 9.1 g/dL — ABNORMAL LOW (ref 12.0–15.0)
MCH: 27.3 pg (ref 26.0–34.0)
MCHC: 30.6 g/dL (ref 30.0–36.0)
MCV: 89.2 fL (ref 80.0–100.0)
Platelets: 372 K/uL (ref 150–400)
RBC: 3.33 MIL/uL — ABNORMAL LOW (ref 3.87–5.11)
RDW: 14.6 % (ref 11.5–15.5)
WBC: 12.3 K/uL — ABNORMAL HIGH (ref 4.0–10.5)
nRBC: 0 % (ref 0.0–0.2)

## 2024-11-07 LAB — GLUCOSE, CAPILLARY
Glucose-Capillary: 134 mg/dL — ABNORMAL HIGH (ref 70–99)
Glucose-Capillary: 144 mg/dL — ABNORMAL HIGH (ref 70–99)
Glucose-Capillary: 160 mg/dL — ABNORMAL HIGH (ref 70–99)
Glucose-Capillary: 160 mg/dL — ABNORMAL HIGH (ref 70–99)
Glucose-Capillary: 176 mg/dL — ABNORMAL HIGH (ref 70–99)
Glucose-Capillary: 185 mg/dL — ABNORMAL HIGH (ref 70–99)
Glucose-Capillary: 80 mg/dL (ref 70–99)

## 2024-11-07 LAB — MAGNESIUM: Magnesium: 2.3 mg/dL (ref 1.7–2.4)

## 2024-11-07 LAB — TRIGLYCERIDES: Triglycerides: 200 mg/dL — ABNORMAL HIGH (ref <150)

## 2024-11-07 MED ORDER — NOREPINEPHRINE 4 MG/250ML-% IV SOLN: 0.0000 ug/min | INTRAVENOUS | Status: DC

## 2024-11-07 MED ORDER — PIPERACILLIN-TAZOBACTAM 3.375 G IVPB
3.3750 g | Freq: Three times a day (TID) | INTRAVENOUS | Status: AC
  Administered 2024-11-07 – 2024-11-11 (×14): 3.375 g via INTRAVENOUS
  Filled 2024-11-07 (×14): qty 50

## 2024-11-07 NOTE — Progress Notes (Signed)
 At the beginning of the shift wake up assessment was performed.  Patient was started on precedex , propofol  stopped, and fentanyl  stopped as directed by provider.  Patient's became hypertensive, tachycardic, and decrease in oxygen  saturations hafl-way through the wake up assessment; provider to bedside and per provider if patient does not recover in ten minutes with precedex  to move forward with re-sedating patient.  Patient unfortunately was unable to recover and precedex  was stopped, fentanyl  restarted as well as propofol  per provider direction.  Patient remained calm and had no more events of abnormal vital signs once re-sedated.  Will continue to monitor patient.

## 2024-11-07 NOTE — Progress Notes (Signed)
 NAME:  Rachael Blankenship, MRN:  [id number], DOB:  May 13, 1956, LOS: 3 ADMISSION DATE:  11/04/2024, CHIEF COMPLAINT:  Respiratory Failure   History of Present Illness:  Patient is a 68 year old female with a history of smoking and COPD with chronic hypoxic and hypercapnic respiratory failure who presents to the hospital with increased shortness of breath, cough, and wheezing consistent with a COPD exacerbation.   Patient reports symptom onset around 7 days ago with worsening shortness of breath, cough, wheezing, and increased exertional dyspnea.  She reports having increased oxygen  requirements and inability to walk or move short distances without having significant desaturation.  This further worsened over this week and she was unable to wait until her scheduled appointment with her primary pulmonologist prompting her to present to the ED.   EMS was activated and she was brought in with hypoxia and increased work of breathing.  She received IV steroids and was started on BiPAP for respiratory distress and admitted to the medicine service for further workup and management.  Patient started on IV Solu-Medrol , doxycycline , and received a dose of acetazolamide .  Given lack of improvement today, pulmonary and critical care medicine were consulted for increased risk of respiratory failure.   Medical chart reviewed, patient had a history of smoking and she quit 1 year ago.  Patient has been followed by outpatient pulmonary for severe COPD with her last FEV1 at 0.61 L (29% predicted).  She has chronic hypoxia for which she was started on supplemental oxygen .   Patient was admitted to the hospital in January 2025 with acute on chronic hypoxic respiratory failure with ICU admission.  She was continued on nocturnal BiPAP after send admission and switched to triple therapy with Breztri  and Ensifentrine  was considered.  She was started on Dupixent to reduce frequency of COPD exacerbations early in 2025.  She was  further referred to cardiology for consideration of a right heart catheterization for workup of pulmonary hypertension.   Right heart cath 04/21/2024 showed RA 10/6, PA 40/12 (24), and PCWP of 20 not indicative of pulmonary hypertension.   CT chest ruled out PE in October 2025 after an exacerbation.  This was notable for unchanged complete collapse of the right middle lobe with atelectasis in the lingula.  She then underwent bronchoscopy with BAL of the right middle lobe with cultures returning negative.   Patient then admitted in November 2025 with COPD exacerbation treated with antibiotics and discharged on levofloxacin  as well as a prednisone  taper.  Patient seen by her pulmonologist in early November where her prednisone  taper was extended (taper by 5 mg every 5 days).  She was also started on thrice weekly azithromycin .  Pertinent  Medical History  -Severe COPD -Chronic hypoxic respiratory failure  Significant Hospital Events: Including procedures, antibiotic start and stop dates in addition to other pertinent events   11/04/24: Admitted with COPD exacerbation, on BiPAP 11/05/24: Consult to critical care for respiratory failure and risk of decompensation and need for intubation and mechanical ventilation. Intubated at night 11/06/24: On dexmedetomidine  and fentanyl  for analgesia and sedation. Synchronous with the vent. Family updated at bedside at length 11/07/24: WUA this AM, agitated on dexmedetomidine  (prop/fentanyl  dc'd). Attempt SBT  Interim History / Subjective:  Intubated and sedated in AM, agitated on WUA (dexmedetomidine ).  Objective    Blood pressure (!) 150/73, pulse (!) 102, temperature 99 F (37.2 C), temperature source Oral, resp. rate 20, height 5' 3 (1.6 m), weight 62.3 kg, SpO2 93%.    Vent  Mode: PRVC FiO2 (%):  [35 %] 35 % Set Rate:  [20 bmp] 20 bmp Vt Set:  [400 mL] 400 mL PEEP:  [5 cmH20] 5 cmH20   Intake/Output Summary (Last 24 hours) at 11/07/2024  0833 Last data filed at 11/07/2024 9247 Gross per 24 hour  Intake 1444.46 ml  Output 1120 ml  Net 324.46 ml   Filed Weights   11/04/24 1453 11/05/24 1411 11/07/24 0500  Weight: 60.8 kg 62.6 kg 62.3 kg    Examination: Physical Exam Constitutional:      General: She is not in acute distress.    Appearance: She is ill-appearing.  Cardiovascular:     Rate and Rhythm: Regular rhythm. Tachycardia present.     Pulses: Normal pulses.     Heart sounds: Normal heart sounds.  Pulmonary:     Breath sounds: No wheezing.     Comments: Decreased air entry bilaterally Abdominal:     Palpations: Abdomen is soft.  Musculoskeletal:     Right lower leg: No edema.     Left lower leg: No edema.  Neurological:     Mental Status: She is disoriented.     Comments: Sedated, vented     Assessment and Plan   #Acute on chronic hypoxic and hypercapnic respiratory failure #COPD exacerbation #Chronic atelectasis of the right middle lobe  Neuro - Intubated overnight, now on propofol  and fentanyl  to maintain RASS goal of -1 and CPOT <2. -Daily wake up assessment, will attempt on dexmedetomidine  this AM  CV - while there's a report of pulmonary hypertension in the chart, this was ruled out with right heart catheterization 03/2024. Wedge pressure was elevated at 20, but she doesn't have classic signs of heart failure and management of this was deferred in the outpatient setting. Hypoxia could result in elevated PVR, especially in the setting of an exacerbation.   Pulm - her acute on chronic hypoxic respiratory failure is due to a COPD exacerbation, and further complicated by chronic atelectasis of the right middle lobe. Bronchoscopy failed to isolate an organism with BAL cultures returning negative. Most recent repeat imaging shows the RML atelectasis to persist. This is a chronic finding for around 10 years, with evidence of it on CT chest from 04/2015 that I have personally reviewed.   Blood gas this  admission showed mild respiratory acidosis, with acute on chronic hypercapnia, consistent with a COPD exacerbation. Steroid dose has been increased to 40 bid and her antibiotic coverage broadened. She is maintained on an optimal outpatient regimen of ICS/LABA/LAMA (Breztri ), biologics (On Dupilumab with peak eosinophil count of 400 09/2024) and suppressive antibiotics (Azithromycin ). She is also on nocturnal BiPAP. She is being considered for Ensifentrine  by her outpatient pulmonologist.  She was intubated secondary to increased work of breathing. I suspect she developed breath stacking resulting in her worsening respiratory failure. She is improved now that she is on mechanical ventilation. Patient does exhibit severe agitation and anxiety with WUA, with elevated BP and RR which will represent a challenge for extubation. Plan for daily SBT and extubate to BiPAP once able.   -continue methylpred 40 bid -standing duo-nebs every 4 hours -antibiotics as below -consider repeat CT to rule out PE if fails to improve (last ruled out 09/30/24)   GI - Place OG, start PPI for SUP, tube feeds   Renal - Kidney function at baseline. Replete electrolytes as indicated   Hem/Onc - enoxaparin  for DVT prophylaxis   Endo - ICU glycemic protocol   ID -  obtain respiratory cultures and viral panel. Broaden antibiotic coverage to include Cefepime . MRSA screen negative             -Start Cefepime              -continue Doxycycline              -respiratory cultures sent, results pending  -full viral panel -ve  -will consider bronchoscopy depending on clinical course  Labs   CBC: Recent Labs  Lab 11/04/24 1132 11/05/24 1212 11/06/24 0549 11/07/24 0515  WBC 20.2* 19.1* 16.5* 12.3*  NEUTROABS 13.3*  --   --   --   HGB 13.2 11.6* 9.9* 9.1*  HCT 43.1 38.4 32.2* 29.7*  MCV 88.9 89.3 89.7 89.2  PLT 639* 539* 442* 372    Basic Metabolic Panel: Recent Labs  Lab 11/04/24 1132 11/05/24 1212 11/06/24 0549  11/07/24 0515  NA 144 143 141 144  K 5.0 4.2 3.5 4.6  CL 99 103 103 109  CO2 32 24 27 28   GLUCOSE 194* 135* 170* 204*  BUN 25* 26* 41* 47*  CREATININE 0.89 0.67 0.66 0.59  CALCIUM 10.7* 9.6 9.0 8.6*  MG  --   --   --  2.3  PHOS  --  2.6 2.5 2.0*   GFR: Estimated Creatinine Clearance: 54.1 mL/min (by C-G formula based on SCr of 0.59 mg/dL). Recent Labs  Lab 11/04/24 1132 11/04/24 1350 11/05/24 1212 11/06/24 0549 11/07/24 0515  PROCALCITON  --   --  0.24  --   --   WBC 20.2*  --  19.1* 16.5* 12.3*  LATICACIDVEN 2.5* 1.0  --   --   --     Liver Function Tests: Recent Labs  Lab 11/04/24 1132 11/05/24 1212 11/06/24 0549 11/07/24 0515  AST 25  --   --   --   ALT 51*  --   --   --   ALKPHOS 138*  --   --   --   BILITOT 0.4  --   --   --   PROT 8.9*  --   --   --   ALBUMIN 4.4 4.2 3.5 3.3*   No results for input(s): LIPASE, AMYLASE in the last 168 hours. No results for input(s): AMMONIA in the last 168 hours.  ABG    Component Value Date/Time   PHART 7.28 (L) 11/06/2024 0045   PCO2ART 69 (HH) 11/06/2024 0045   PO2ART 98 11/06/2024 0045   HCO3 32.4 (H) 11/06/2024 0045   O2SAT 98.8 11/06/2024 0045     Coagulation Profile: No results for input(s): INR, PROTIME in the last 168 hours.  Cardiac Enzymes: No results for input(s): CKTOTAL, CKMB, CKMBINDEX, TROPONINI in the last 168 hours.  HbA1C: Hemoglobin A1C  Date/Time Value Ref Range Status  12/11/2023 04:01 PM 6.2 (A) 4.0 - 5.6 % Final   Hgb A1c MFr Bld  Date/Time Value Ref Range Status  05/22/2018 12:02 AM 6.2 (H) 4.8 - 5.6 % Final    Comment:    (NOTE) Pre diabetes:          5.7%-6.4% Diabetes:              >6.4% Glycemic control for   <7.0% adults with diabetes     CBG: Recent Labs  Lab 11/06/24 1552 11/06/24 1949 11/07/24 0003 11/07/24 0319 11/07/24 0748  GLUCAP 144* 176* 134* 144* 185*    Review of Systems:   N/A  Past Medical History:  She,  has a  past medical  history of Bruises easily, Chronic respiratory failure with hypoxia (HCC), Cigarette smoker, Collapse of right lung, COPD (chronic obstructive pulmonary disease) (HCC), COVID-19 (07/2024), Dependence on supplemental oxygen , Depression with anxiety, Endometriosis, GERD (gastroesophageal reflux disease), Grade I diastolic dysfunction, History of sepsis, Hyperlipidemia, Hypertension (06/02/2012), Kidney stones, Pulmonary HTN (HCC), Reactive airway disease (03/08/2014), and Vitamin D  deficiency.   Surgical History:   Past Surgical History:  Procedure Laterality Date   BRONCHIAL WASHINGS N/A 09/17/2024   Procedure: IRRIGATION, BRONCHUS;  Surgeon: Parris Manna, MD;  Location: [facility] ORS;  Service: Thoracic;  Laterality: N/A;   CATARACT EXTRACTION W/PHACO Right 12/04/2023   Procedure: CATARACT EXTRACTION PHACO AND INTRAOCULAR LENS PLACEMENT (IOC) RIGHT  CLAREON VIVITY LENS;  Surgeon: Enola [name], MD;  [facility];  Service: Ophthalmology;  Laterality: Right;  10.40 1:04.0   CATARACT EXTRACTION W/PHACO Left 12/18/2023   Procedure: CATARACT EXTRACTION PHACO AND INTRAOCULAR LENS PLACEMENT (IOC) LEFT  CLAREON VIVITY TORIC 16.28, 01:19.2;  Surgeon: Enola [name], MD;  Location: [facility];  Service: Ophthalmology;  Laterality: Left;   COLONOSCOPY WITH PROPOFOL  N/A 01/02/2023   Procedure: COLONOSCOPY WITH PROPOFOL ;  Surgeon: Unk Corinn Skiff, MD;  Location: [facility];  Service: Gastroenterology;  Laterality: N/A;   ESOPHAGOGASTRODUODENOSCOPY N/A 07/29/2018   Procedure: ESOPHAGOGASTRODUODENOSCOPY (EGD);  Surgeon: Toledo, Ladell POUR, MD;  Location: [facility] ENDOSCOPY;  Service: Gastroenterology;  Laterality: N/A;   ESOPHAGOGASTRODUODENOSCOPY (EGD) WITH PROPOFOL  N/A 01/02/2023   Procedure: ESOPHAGOGASTRODUODENOSCOPY (EGD) WITH PROPOFOL ;  Surgeon: Unk Corinn Skiff, MD;  Location: [facility] ENDOS[facility];  Service: Gastroenterology;  Laterality: N/A;   FLEXIBLE BRONCHOSCOPY N/A  09/17/2024   Procedure: BRONCHOSCOPY, FLEXIBLE;  Surgeon: Parris Manna, MD;  Location: [facility];  Service: Thoracic;  Laterality: N/A;   IR RADIOLOGIST EVAL & MGMT  10/19/2024   KIDNEY STONE SURGERY     RIGHT HEART CATH Right 04/21/2024   Procedure: RIGHT HEART CATH;  Surgeon: Ammon Blunt, MD;  Location: [facility] INVASIVE CV LAB;  Service: Cardiovascular;  Laterality: Right;   TONSILLECTOMY       Social History:   reports that she quit smoking about 10 months ago. Her smoking use included cigarettes. She started smoking about 50 years ago. She has a 26.8 pack-year smoking history. She has never used smokeless tobacco. She reports current alcohol use. She reports that she does not use drugs.   Family History:  Her family history includes GER disease in her father and mother; Heart attack in her father; Hyperlipidemia in her mother; Hypertension in her father and mother; Parkinson's disease in her father.   Allergies Allergies[1]   Home Medications  Prior to Admission medications  Medication Sig Start Date End Date Taking? Authorizing Provider  ALPRAZolam  (XANAX ) 0.25 MG tablet Take 1 tablet (0.25 mg total) by mouth 3 (three) times daily as needed for anxiety. 12/11/23  Yes Abernathy, Mardy, NP  ascorbic acid  (VITAMIN C ) 500 MG tablet Take 500 mg by mouth daily.   Yes [provider]  aspirin  81 MG tablet Take 81 mg by mouth at bedtime.    Yes [provider]  azithromycin  (ZITHROMAX ) 250 MG tablet Take 250 mg by mouth every other day. 10/06/24 11/14/24 Yes [provider]  BREZTRI  AEROSPHERE 160-9-4.8 MCG/ACT AERO inhaler Inhale 2 puffs into the lungs 2 (two) times daily. 04/12/24  Yes [provider]  buPROPion  (WELLBUTRIN ) 75 MG tablet Take 75 mg by mouth 2 (two) times daily.   Yes [provider]  Cholecalciferol  25 MCG (1000  UT) tablet Take 1,000 Units by mouth daily.    Yes [provider]  Coenzyme Q10 (CO Q-10) 100 MG CAPS  Take 100 mg by mouth daily.   Yes [provider]  DENTA 5000 PLUS 1.1 % CREA dental cream Take 1 application  by mouth 2 (two) times daily as needed. 04/14/21  Yes [provider]  dextromethorphan-guaiFENesin  (MUCINEX  DM) 30-600 MG 12hr tablet Take 1 tablet by mouth 2 (two) times daily as needed for cough. 10/02/24  Yes Caleen Qualia, MD  DULoxetine  (CYMBALTA ) 60 MG capsule Take 1 capsule (60 mg total) by mouth daily. 05/19/24  Yes Abernathy, Alyssa, NP  Dupilumab 300 MG/2ML SOAJ Inject 300 mg into the skin every 14 (fourteen) days. 01/27/24  Yes [provider]  EPINEPHrine  0.3 mg/0.3 mL IJ SOAJ injection Inject 0.3 mg into the muscle as needed. 01/27/24  Yes [provider]  famotidine  (PEPCID ) 40 MG tablet Take 1 tablet (40 mg total) by mouth 2 (two) times daily. 04/05/24  Yes Abernathy, Alyssa, NP  fluticasone  (FLONASE ) 50 MCG/ACT nasal spray Place 2 sprays into both nostrils daily.   Yes [provider]  furosemide  (LASIX ) 40 MG tablet TAKE 1 TABLET BY MOUTH DAILY 04/12/24  Yes Abernathy, Alyssa, NP  gemfibrozil  (LOPID ) 600 MG tablet Take 1 tablet (600 mg total) by mouth 2 (two) times daily before a meal. 03/29/24  Yes Abernathy, Alyssa, NP  Ipratropium-Albuterol  (COMBIVENT  RESPIMAT) 20-100 MCG/ACT AERS respimat INHALE 1 PUFF INTO THE LUNGS EVERY 6 HOURS AS NEEDED FOR WHEEZING OR SHORTNESS OF BREATH 10/31/23  Yes Khan, Fozia M, MD  Magnesium  250 MG CAPS Take 250 mg by mouth daily.   Yes [provider]  metoprolol  tartrate (LOPRESSOR ) 25 MG tablet Take 0.5 tablets (12.5 mg total) by mouth 2 (two) times daily. 10/02/24  Yes Amin, Sumayya, MD  Multiple Vitamins-Minerals (MULTIVITAMIN WITH MINERALS) tablet Take 1 tablet by mouth daily. Centrum Silver 50+   Yes [provider]  Potassium Chloride  CR (MICRO-K ) 8 MEQ CPCR capsule CR Take 1 capsule (8 mEq total) by mouth daily. 10/26/23  Yes Khan, Fozia M, MD  predniSONE  (DELTASONE ) 5 MG tablet Take by  mouth. Take 9 tablets (45 mg total) by mouth once daily for 5 days, THEN 8 tablets (40 mg total) once daily for 5 days, THEN 7 tablets (35 mg total) once daily for 5 days, THEN 6 tablets (30 mg total) once daily for 5 days, THEN 5 tablets (25 mg total) once daily for 5 days, THEN 4 tablets (20 mg total) once daily for 5 days, THEN 3 tablets (15 mg total) once daily for 5 days, THEN 2 tablets (10 mg total) once daily for 5 days, THEN 1 tablet (5 mg total) once daily for 5 days, THEN 0.5 tablets (2.5 mg total) once daily for 5 days. 10/06/24 11/25/24 Yes [provider]  sucralfate  (CARAFATE ) 1 g tablet Take 1 g by mouth 4 (four) times daily as needed (Stomach).   Yes [provider]  vitamin E  180 MG (400 UNITS) capsule Take 400 Units by mouth daily.   Yes [provider]  colchicine 0.6 MG tablet Take 0.6 mg by mouth daily. Patient not taking: Reported on 11/04/2024 09/13/24   [provider]  feeding supplement (ENSURE PLUS HIGH PROTEIN) LIQD Take 237 mLs by mouth 2 (two) times daily between meals. 10/03/24   Amin, Sumayya, MD  OXYGEN  Inhale 2 L into the lungs continuous. Continuous oxygen  and NIV at night,  2 liters   pt uses [facility] for oxygen  supplies    [provider]     The patient is critically ill due to acute on chronic hypoxic and hypercapnic respiratory failure, COPD exacerbation.  Critical care was necessary to treat or prevent imminent or life-threatening deterioration. I personally performed high risk medication and infusion titration and management, titration, monitoring, and management of vasopressor/ionotrope infusion, mechanical ventilation management and titration, and weaning of mechanical ventilation. Critical care time was spent by me on the following activities: development of a treatment plan with the patient and/or surrogate as well as nursing, discussions with consultants, evaluation of the patient's response to treatment, examination of the  patient, obtaining a history from the patient or surrogate, ordering and performing treatments and interventions, ordering and review of laboratory studies, ordering and review of radiographic studies, review of telemetry data including pulse oximetry, re-evaluation of patient's condition and participation in multidisciplinary rounds.   I personally spent 50 minutes providing critical care not including any separately billable procedures.   Belva November, MD [facility] 11/07/2024 8:38 AM                  [1]  Allergies Allergen Reactions   Naproxen Rash and Other (See Comments)    Spouse confirmed that pt breaks out in hives head to toe.    Sulfa Antibiotics Hives and Rash    Sulfa products.

## 2024-11-07 NOTE — Plan of Care (Signed)
 Continuing with plan of care.

## 2024-11-08 ENCOUNTER — Inpatient Hospital Stay

## 2024-11-08 DIAGNOSIS — J9811 Atelectasis: Secondary | ICD-10-CM | POA: Diagnosis not present

## 2024-11-08 DIAGNOSIS — Z4682 Encounter for fitting and adjustment of non-vascular catheter: Secondary | ICD-10-CM | POA: Diagnosis not present

## 2024-11-08 LAB — RENAL FUNCTION PANEL
Albumin: 3.3 g/dL — ABNORMAL LOW (ref 3.5–5.0)
Anion gap: 7 (ref 5–15)
BUN: 53 mg/dL — ABNORMAL HIGH (ref 8–23)
CO2: 28 mmol/L (ref 22–32)
Calcium: 9 mg/dL (ref 8.9–10.3)
Chloride: 110 mmol/L (ref 98–111)
Creatinine, Ser: 0.58 mg/dL (ref 0.44–1.00)
GFR, Estimated: >60 mL/min (ref >60)
Glucose, Bld: 161 mg/dL — ABNORMAL HIGH (ref 70–99)
Phosphorus: 2.6 mg/dL (ref 2.5–4.6)
Potassium: 4.5 mmol/L (ref 3.5–5.1)
Sodium: 144 mmol/L (ref 135–145)

## 2024-11-08 LAB — CBC
HCT: 29 % — ABNORMAL LOW (ref 36.0–46.0)
Hemoglobin: 8.8 g/dL — ABNORMAL LOW (ref 12.0–15.0)
MCH: 27.6 pg (ref 26.0–34.0)
MCHC: 30.3 g/dL (ref 30.0–36.0)
MCV: 90.9 fL (ref 80.0–100.0)
Platelets: 371 K/uL (ref 150–400)
RBC: 3.19 MIL/uL — ABNORMAL LOW (ref 3.87–5.11)
RDW: 15 % (ref 11.5–15.5)
WBC: 13.4 K/uL — ABNORMAL HIGH (ref 4.0–10.5)
nRBC: 0 % (ref 0.0–0.2)

## 2024-11-08 LAB — CULTURE, RESPIRATORY W GRAM STAIN

## 2024-11-08 LAB — GLUCOSE, CAPILLARY
Glucose-Capillary: 152 mg/dL — ABNORMAL HIGH (ref 70–99)
Glucose-Capillary: 163 mg/dL — ABNORMAL HIGH (ref 70–99)
Glucose-Capillary: 120 mg/dL — ABNORMAL HIGH (ref 70–99)
Glucose-Capillary: 142 mg/dL — ABNORMAL HIGH (ref 70–99)
Glucose-Capillary: 148 mg/dL — ABNORMAL HIGH (ref 70–99)
Glucose-Capillary: 117 mg/dL — ABNORMAL HIGH (ref 70–99)

## 2024-11-08 LAB — LEGIONELLA PNEUMOPHILA SEROGP 1 UR AG: L. pneumophila Serogp 1 Ur Ag: NEGATIVE

## 2024-11-08 LAB — MAGNESIUM: Magnesium: 2.3 mg/dL (ref 1.7–2.4)

## 2024-11-08 MED ORDER — DIAZEPAM 5 MG/ML IJ SOLN
2.5000 mg | Freq: Three times a day (TID) | INTRAMUSCULAR | Status: DC
  Administered 2024-11-08 – 2024-11-11 (×9): 2.5 mg via INTRAVENOUS
  Filled 2024-11-08 (×9): qty 2

## 2024-11-08 MED ORDER — DIAZEPAM 2 MG PO TABS
2.0000 mg | ORAL_TABLET | Freq: Three times a day (TID) | ORAL | Status: DC
  Administered 2024-11-08: 13:00:00 2 mg
  Filled 2024-11-08: qty 1

## 2024-11-08 MED ORDER — FENTANYL CITRATE (PF) 50 MCG/ML IJ SOSY: 25.0000 ug | PREFILLED_SYRINGE | INTRAMUSCULAR | Status: DC | PRN

## 2024-11-08 MED ORDER — STERILE WATER FOR INJECTION IJ SOLN
INTRAMUSCULAR | Status: AC
  Administered 2024-11-08: 13:00:00 5 mL
  Filled 2024-11-08: qty 10

## 2024-11-08 MED ORDER — FENTANYL CITRATE (PF) 50 MCG/ML IJ SOSY
25.0000 ug | PREFILLED_SYRINGE | INTRAMUSCULAR | Status: DC | PRN
  Administered 2024-11-08 – 2024-11-10 (×5): 50 ug via INTRAVENOUS
  Filled 2024-11-08 (×5): qty 1

## 2024-11-08 MED ORDER — ACETAZOLAMIDE SODIUM 500 MG IJ SOLR
500.0000 mg | Freq: Once | INTRAMUSCULAR | Status: AC
  Administered 2024-11-08: 13:00:00 500 mg via INTRAVENOUS
  Filled 2024-11-08: qty 500

## 2024-11-08 MED ORDER — DIAZEPAM 2 MG PO TABS
2.0000 mg | ORAL_TABLET | Freq: Two times a day (BID) | ORAL | Status: DC
  Administered 2024-11-08: 09:00:00 2 mg
  Filled 2024-11-08: qty 1

## 2024-11-08 MED ORDER — DEXMEDETOMIDINE HCL IN NACL 400 MCG/100ML IV SOLN
0.0000 ug/kg/h | INTRAVENOUS | Status: DC
  Administered 2024-11-08: 22:00:00 1 ug/kg/h via INTRAVENOUS
  Administered 2024-11-08: 15:00:00 0.8 ug/kg/h via INTRAVENOUS
  Administered 2024-11-08: 09:00:00 0.4 ug/kg/h via INTRAVENOUS
  Administered 2024-11-09: 10:00:00 0.9 ug/kg/h via INTRAVENOUS
  Administered 2024-11-09: 04:00:00 1 ug/kg/h via INTRAVENOUS
  Administered 2024-11-09: 21:00:00 1.2 ug/kg/h via INTRAVENOUS
  Administered 2024-11-10: 21:00:00 1 ug/kg/h via INTRAVENOUS
  Administered 2024-11-10: 03:00:00 1.2 ug/kg/h via INTRAVENOUS
  Administered 2024-11-10: 09:00:00 1 ug/kg/h via INTRAVENOUS
  Administered 2024-11-10: 15:00:00 1.2 ug/kg/h via INTRAVENOUS
  Administered 2024-11-11: 11:00:00 0.6 ug/kg/h via INTRAVENOUS
  Administered 2024-11-11: 04:00:00 1.1 ug/kg/h via INTRAVENOUS
  Administered 2024-11-12: 0.5 ug/kg/h via INTRAVENOUS
  Filled 2024-11-08 (×13): qty 100

## 2024-11-08 NOTE — Progress Notes (Signed)
 Pt was extubated at 1015 to [facility] by RT. Provider at bedside. Vital signs stable. Pt alert and following commands.

## 2024-11-08 NOTE — Care Management Important Message (Signed)
 Important Message  Patient Details  Name: Rachael Blankenship MRN: [id number] Date of Birth: 10-20-1956   Important Message Given:  Yes - Medicare IM     Rojelio [name] 11/08/2024, 2:51 PM

## 2024-11-08 NOTE — Plan of Care (Signed)
  Problem: Education: Goal: Knowledge of General Education information will improve Description: Including pain rating scale, medication(s)/side effects and non-pharmacologic comfort measures Outcome: Not Progressing   Problem: Health Behavior/Discharge Planning: Goal: Ability to manage health-related needs will improve Outcome: Not Progressing   Problem: Clinical Measurements: Goal: Ability to maintain clinical measurements within normal limits will improve Outcome: Not Progressing Goal: Will remain free from infection Outcome: Not Progressing Goal: Diagnostic test results will improve Outcome: Not Progressing Goal: Respiratory complications will improve Outcome: Not Progressing Goal: Cardiovascular complication will be avoided Outcome: Not Progressing   Problem: Activity: Goal: Risk for activity intolerance will decrease Outcome: Not Progressing   Problem: Nutrition: Goal: Adequate nutrition will be maintained Outcome: Not Progressing   Problem: Coping: Goal: Level of anxiety will decrease Outcome: Not Progressing   Problem: Elimination: Goal: Will not experience complications related to bowel motility Outcome: Not Progressing Goal: Will not experience complications related to urinary retention Outcome: Not Progressing   Problem: Pain Managment: Goal: General experience of comfort will improve and/or be controlled Outcome: Not Progressing   Problem: Safety: Goal: Ability to remain free from injury will improve Outcome: Not Progressing   Problem: Skin Integrity: Goal: Risk for impaired skin integrity will decrease Outcome: Not Progressing   Problem: Education: Goal: Knowledge of disease or condition will improve Outcome: Not Progressing Goal: Knowledge of the prescribed therapeutic regimen will improve Outcome: Not Progressing Goal: Individualized Educational Video(s) Outcome: Not Progressing   Problem: Activity: Goal: Ability to tolerate increased  activity will improve Outcome: Not Progressing Goal: Will verbalize the importance of balancing activity with adequate rest periods Outcome: Not Progressing   Problem: Respiratory: Goal: Ability to maintain a clear airway will improve Outcome: Not Progressing Goal: Levels of oxygenation will improve Outcome: Not Progressing Goal: Ability to maintain adequate ventilation will improve Outcome: Not Progressing

## 2024-11-08 NOTE — Progress Notes (Signed)
 Pt looked SOB, vitals stable. Inge, NP at bedside. Pt's husband states this is pt's normal work of breathing. Pt's husband mentioned her anxiety which is high at baseline. Following conversation dose frequency increased and dose given.

## 2024-11-08 NOTE — Progress Notes (Signed)
 Extubation order written. Cuff leak noted.  Patient extubated and placed on HHFNC  @ 1010.

## 2024-11-08 NOTE — Progress Notes (Signed)
 Dr. Isaiah at bedside. Pt continues to look SOB. Vitals stable. MD Kasa had a conversation with husband at bedside which he reports this is normal work of breathing for his wife.

## 2024-11-08 NOTE — Progress Notes (Signed)
 PHARMACY CONSULT NOTE - FOLLOW UP  Pharmacy Consult for Electrolyte Monitoring and Replacement   Recent Labs: Potassium (mmol/L)  Date Value  11/08/2024 4.5   Magnesium  (mg/dL)  Date Value  87/84/7974 2.3   Calcium (mg/dL)  Date Value  87/84/7974 9.0   Albumin (g/dL)  Date Value  87/84/7974 3.3 (L)  11/21/2023 4.4   Phosphorus (mg/dL)  Date Value  87/84/7974 2.6   Sodium (mmol/L)  Date Value  11/08/2024 144  11/21/2023 141     Assessment: 68 year old female with a history of smoking and COPD with chronic hypoxic and hypercapnic respiratory failure who presents to the hospital with increased shortness of breath, cough, and wheezing. Pharmacy is asked to follow and replace electrolytes while in CCU.   Goal of Therapy:  Electrolytes WNL  Plan:  ---no electrolyte replacement warranted for today ---recheck electrolytes in am  Rachael Blankenship ,PharmD Clinical Pharmacist 11/08/2024 12:38 PM

## 2024-11-08 NOTE — Progress Notes (Signed)
 Initial Nutrition Assessment  DOCUMENTATION CODES:   Not applicable  INTERVENTION:   RD will add supplements with diet advancement   If tube feeds initiated, recommend:  Osmolite 1.1@65ml /hr- Initiate at 76ml/hr and increase by 10ml/hr q 8 hours until goal rate is reached.   Free water  flushes 30ml q4 hours to maintain tube patency   Regimen provides 1872kcal/day, 87g/day protein and 1468ml/day of free water .   Pt at high refeed risk; recommend monitor potassium, magnesium  and phosphorus labs daily until stable  Vitamin C  500mg  BID via tube   Thiamine  100mg  daily via tube x 7 days   Daily weights   NUTRITION DIAGNOSIS:   Inadequate oral intake related to acute illness as evidenced by NPO status.  GOAL:   Patient will meet greater than or equal to 90% of their needs  MONITOR:   Diet advancement, Labs, Weight trends, Skin, I & O's  REASON FOR ASSESSMENT:   Ventilator    ASSESSMENT:   69 y/o female with h/o COPD, anxiety, depression, GERD, pulmonary hypertension, HLD, CHF and kidney stones who is admitted with COPD exacerbation.  Pt extubated this morning. NGT remains in place but needs advancement; RN aware. Will plan to initiate tube feeds once NGT in good position. Will add supplements if diet is able to be initiated. Pt is now without adequate nutrition for 5 days. Pt remains at high refeed risk. Per chart, pt appears fairly weight stable at baseline. Pt is up ~3lbs since admission. Pt +2.3L on her I & Os.   Medications reviewed and include: colace, lovenox , insulin , solu-medrol , protonix , miralax , doxycycline , zosyn   Labs reviewed: K 4.5 wnl, BUN 53(H), P 2.6 wnl, Mg 2.3 wnl Wbc- 13.4(H), Hgb 8.8(L), Hct 29.0(L) Cbgs- 120, 163, 152 x 24 hrs  AIC 6.2(H)- 1/16  UOP-   NUTRITION - FOCUSED PHYSICAL EXAM:  Flowsheet Row Most Recent Value  Orbital Region No depletion  Upper Arm Region Mild depletion  Thoracic and Lumbar Region No depletion  Buccal  Region No depletion  Temple Region Mild depletion  Clavicle Bone Region Mild depletion  Clavicle and Acromion Bone Region Mild depletion  Scapular Bone Region No depletion  Dorsal Hand Unable to assess  Patellar Region No depletion  Anterior Thigh Region No depletion  Posterior Calf Region No depletion  Edema (RD Assessment) Mild BUE and LLE  Hair Reviewed  Eyes Reviewed  Mouth Reviewed  Skin Reviewed  Nails Reviewed   Diet Order:   Diet Order             Diet NPO time specified  Diet effective now                  EDUCATION NEEDS:   No education needs have been identified at this time  Skin:  Skin Assessment: Reviewed RN Assessment (ecchymosis)  Last BM:  pta  Height:   Ht Readings from Last 1 Encounters:  11/05/24 5' 3 (1.6 m)    Weight:   Wt Readings from Last 1 Encounters:  11/08/24 64 kg    Ideal Body Weight:  52.3 kg  BMI:  Body mass index is 24.99 kg/m.  Estimated Nutritional Needs:   Kcal:  1600-1800kcal/day  Protein:  80-90g/day  Fluid:  1.4-1.6L/day  Augustin Shams MS, RD, LDN If unable to be reached, please send secure chat to RD inpatient available from 8:00a-4:00p daily

## 2024-11-08 NOTE — Progress Notes (Signed)
 NAME:  Rachael Blankenship, MRN:  [id number], DOB:  September 01, 1956, LOS: 4 ADMISSION DATE:  11/04/2024, CONSULTATION DATE:  11/05/2024 CHIEF COMPLAINT:  Respiratory Failure    Brief Pt Description / Synopsis:  68 y.o female admitted with Acute on Chronic Hypoxic and Hypercapnic Respiratory Failure due to severe Acute COPD Exacerbation, failed trial of BiPAP requiring intubation and mechanical ventilation.   History of Present Illness:  Patient is a 68 year old female with a history of smoking and COPD with chronic hypoxic and hypercapnic respiratory failure who presents to the hospital with increased shortness of breath, cough, and wheezing consistent with a COPD exacerbation.   Patient reports symptom onset around 7 days ago with worsening shortness of breath, cough, wheezing, and increased exertional dyspnea.  She reports having increased oxygen  requirements and inability to walk or move short distances without having significant desaturation.  This further worsened over this week and she was unable to wait until her scheduled appointment with her primary pulmonologist prompting her to present to the ED.   EMS was activated and she was brought in with hypoxia and increased work of breathing.  She received IV steroids and was started on BiPAP for respiratory distress and admitted to the medicine service for further workup and management.  Patient started on IV Solu-Medrol , doxycycline , and received a dose of acetazolamide .  Given lack of improvement today, pulmonary and critical care medicine were consulted for increased risk of respiratory failure.   Medical chart reviewed, patient had a history of smoking and she quit 1 year ago.  Patient has been followed by outpatient pulmonary for severe COPD with her last FEV1 at 0.61 L (29% predicted).  She has chronic hypoxia for which she was started on supplemental oxygen .   Patient was admitted to the hospital in January 2025 with acute on chronic hypoxic  respiratory failure with ICU admission.  She was continued on nocturnal BiPAP after send admission and switched to triple therapy with Breztri  and Ensifentrine  was considered.  She was started on Dupixent to reduce frequency of COPD exacerbations early in 2025.  She was further referred to cardiology for consideration of a right heart catheterization for workup of pulmonary hypertension.   Right heart cath 04/21/2024 showed RA 10/6, PA 40/12 (24), and PCWP of 20 not indicative of pulmonary hypertension.   CT chest ruled out PE in October 2025 after an exacerbation.  This was notable for unchanged complete collapse of the right middle lobe with atelectasis in the lingula.  She then underwent bronchoscopy with BAL of the right middle lobe with cultures returning negative.   Patient then admitted in November 2025 with COPD exacerbation treated with antibiotics and discharged on levofloxacin  as well as a prednisone  taper.  Patient seen by her pulmonologist in early November where her prednisone  taper was extended (taper by 5 mg every 5 days).  She was also started on thrice weekly azithromycin .  TR[facility] asked to admit for further workup and treatment.  Please see Significant Hospital Events section below for full detailed hospital course.   Pertinent  Medical History   Past Medical History:  Diagnosis Date   Bruises easily    Chronic respiratory failure with hypoxia (HCC)    Cigarette smoker    Collapse of right lung    COPD (chronic obstructive pulmonary disease) (HCC)    COVID-19 07/2024   Dependence on supplemental oxygen     Depression with anxiety    Endometriosis    GERD (gastroesophageal reflux disease)  Grade I diastolic dysfunction    History of sepsis    Hyperlipidemia    Hypertension 06/02/2012   Kidney stones    Pulmonary HTN (HCC)    Reactive airway disease 03/08/2014   Vitamin D  deficiency     Micro Data:  12/11: COVID/FLU/RSV PCR>> negative 12/11; Blood cultures x2>>  no growth to date 12/12: RVP>> negative  12/12: Strep pneumo urinary antigen>> negative  12/12: MRSA PCR>>negative  12/13: Tracheal aspirate>>  Antimicrobials:   Anti-infectives (From admission, onward)    Start     Dose/Rate Route Frequency Ordered Stop   11/07/24 1400  piperacillin -tazobactam (ZOSYN ) IVPB 3.375 g        3.375 g 12.5 mL/hr over 240 Minutes Intravenous Every 8 hours 11/07/24 1026     11/05/24 1430  ceFEPIme  (MAXIPIME ) 2 g in sodium chloride  0.9 % 100 mL IVPB  Status:  Discontinued        2 g 200 mL/hr over 30 Minutes Intravenous Every 12 hours 11/05/24 1322 11/07/24 1009   11/04/24 2315  doxycycline  (VIBRAMYCIN ) 100 mg in sodium chloride  0.9 % 250 mL IVPB        100 mg 125 mL/hr over 120 Minutes Intravenous 2 times daily 11/04/24 2300     11/04/24 1445  doxycycline  (VIBRA -TABS) tablet 100 mg  Status:  Discontinued        100 mg Oral Every 12 hours 11/04/24 1432 11/04/24 2304   11/04/24 1130  cefTRIAXone  (ROCEPHIN ) 2 g in sodium chloride  0.9 % 100 mL IVPB        2 g 200 mL/hr over 30 Minutes Intravenous Once 11/04/24 1121 11/04/24 1210   11/04/24 1130  azithromycin  (ZITHROMAX ) 500 mg in sodium chloride  0.9 % 250 mL IVPB        500 mg 250 mL/hr over 60 Minutes Intravenous  Once 11/04/24 1121 11/04/24 1314       Significant Hospital Events: Including procedures, antibiotic start and stop dates in addition to other pertinent events   11/04/24: Admitted with COPD exacerbation, on BiPAP 11/05/24: Consult to critical care for respiratory failure and risk of decompensation and need for intubation and mechanical ventilation. Intubated at night 11/06/24: On dexmedetomidine  and fentanyl  for analgesia and sedation. Synchronous with the vent. Family updated at bedside at length 11/07/24: WUA this AM, agitated on dexmedetomidine  (prop/fentanyl  dc'd). Attempt SBT but failed due to sever anxiety 11/08/24; No significant events overnight. Afebrile, hemodynamically stable, off  Levophed .  On minimal vent support, plan for WUA and SBT utilizing Precedex .  EXTUBATED to HFNC, respiratory status remains tenuous.  Interim History / Subjective:  As outlined above under Significant Hospital Events section  Objective   Blood pressure (!) 113/53, pulse 93, temperature 97.7 F (36.5 C), temperature source Axillary, resp. rate 20, height 5' 3 (1.6 m), weight 64 kg, SpO2 94%.    Vent Mode: PRVC FiO2 (%):  [35 %-36 %] 35 % Set Rate:  [20 bmp] 20 bmp Vt Set:  [400 mL] 400 mL PEEP:  [5 cmH20] 5 cmH20 Plateau Pressure:  [23 cmH20] 23 cmH20   Intake/Output Summary (Last 24 hours) at 11/08/2024 0750 Last data filed at 11/08/2024 0500 Gross per 24 hour  Intake 1912.56 ml  Output 1135 ml  Net 777.56 ml   Filed Weights   11/05/24 1411 11/07/24 0500 11/08/24 0500  Weight: 62.6 kg 62.3 kg 64 kg    Examination: General: Acute on chronically ill appearing female, laying in bed, intubated and sedated, in NAD HENT: Atraumatic, normocephalic,  neck supple, no JVD, orally intubated Lungs: Diminished breath sounds throughout, even, synchronous with vent,  Cardiovascular: Tachycardia, regular rhythm, s1s2, no M/R/G Abdomen: Soft, nontender, nondistended, no guarding or rebound tenderness, BS+ x4 Extremities: Normal bulk and tone, no deformities, no edema, good peripheral perfusion Neuro: Sedated, withdraws from pain, currently not following commands, PERRL GU: Foley catheter in place draining yellow urine   Resolved Hospital Problem list     Assessment & Plan:   #Acute on Chronic Hypoxic & Hypercapnic Respiratory Failure #COPD Exacerbation #Chronic Atelectasis of RML PMHx: COPD on Breztri  and Dupilumab -Full vent support, implement lung protective strategies -Plateau pressures less than 30 cm H20 -Wean FiO2 & PEEP as tolerated to maintain O2 sats 88 to 92% -Follow intermittent Chest X-ray & ABG as needed -Spontaneous Breathing Trials when respiratory parameters met  and mental status permits -Implement VAP Bundle -Bronchodilators & Pulmicort  nebs  -IV Steroids  -ABX as above (Doxycycline  and Zosyn  for severe COPD Exacerbation) -Consider repeat CTa Chest to rule out PE is fails to improve (CTa Chest 09/30/24 negative for PE)  #Sedation needs in setting of mechanical ventilation PMHx: Anxiety  -Maintain a RASS goal of 0 to -1 -Fentanyl  and Propofol  to maintain RASS goal -Avoid sedating medications as able -Daily wake up assessment ~ utilize Precedex   -Start scheduled Valium  2 mg BID       Best Practice (right click and Reselect all SmartList Selections daily)   Diet/type: NPO, tube feeds DVT prophylaxis: LMWH GI prophylaxis: PPI Lines: Central line, Arterial Line, and yes and it is still needed Foley:  Yes, and it is still needed Code Status:  full code Last date of multidisciplinary goals of care discussion [12/15]  12/15: Pt's husband updated at bedside on plan of care.  Labs   CBC: Recent Labs  Lab 11/04/24 1132 11/05/24 1212 11/06/24 0549 11/07/24 0515 11/08/24 0315  WBC 20.2* 19.1* 16.5* 12.3* 13.4*  NEUTROABS 13.3*  --   --   --   --   HGB 13.2 11.6* 9.9* 9.1* 8.8*  HCT 43.1 38.4 32.2* 29.7* 29.0*  MCV 88.9 89.3 89.7 89.2 90.9  PLT 639* 539* 442* 372 371    Basic Metabolic Panel: Recent Labs  Lab 11/04/24 1132 11/05/24 1212 11/06/24 0549 11/07/24 0515 11/08/24 0315  NA 144 143 141 144 144  K 5.0 4.2 3.5 4.6 4.5  CL 99 103 103 109 110  CO2 32 24 27 28 28   GLUCOSE 194* 135* 170* 204* 161*  BUN 25* 26* 41* 47* 53*  CREATININE 0.89 0.67 0.66 0.59 0.58  CALCIUM 10.7* 9.6 9.0 8.6* 9.0  MG  --   --   --  2.3 2.3  PHOS  --  2.6 2.5 2.0* 2.6   GFR: Estimated Creatinine Clearance: 58.9 mL/min (by C-G formula based on SCr of 0.58 mg/dL). Recent Labs  Lab 11/04/24 1132 11/04/24 1350 11/05/24 1212 11/06/24 0549 11/07/24 0515 11/08/24 0315  PROCALCITON  --   --  0.24  --   --   --   WBC 20.2*  --  19.1* 16.5*  12.3* 13.4*  LATICACIDVEN 2.5* 1.0  --   --   --   --     Liver Function Tests: Recent Labs  Lab 11/04/24 1132 11/05/24 1212 11/06/24 0549 11/07/24 0515 11/08/24 0315  AST 25  --   --   --   --   ALT 51*  --   --   --   --   CICERO  138*  --   --   --   --   BILITOT 0.4  --   --   --   --   PROT 8.9*  --   --   --   --   ALBUMIN 4.4 4.2 3.5 3.3* 3.3*   No results for input(s): LIPASE, AMYLASE in the last 168 hours. No results for input(s): AMMONIA in the last 168 hours.  ABG    Component Value Date/Time   PHART 7.28 (L) 11/06/2024 0045   PCO2ART 69 (HH) 11/06/2024 0045   PO2ART 98 11/06/2024 0045   HCO3 32.4 (H) 11/06/2024 0045   O2SAT 98.8 11/06/2024 0045     Coagulation Profile: No results for input(s): INR, PROTIME in the last 168 hours.  Cardiac Enzymes: No results for input(s): CKTOTAL, CKMB, CKMBINDEX, TROPONINI in the last 168 hours.  HbA1C: Hemoglobin A1C  Date/Time Value Ref Range Status  12/11/2023 04:01 PM 6.2 (A) 4.0 - 5.6 % Final   Hgb A1c MFr Bld  Date/Time Value Ref Range Status  05/22/2018 12:02 AM 6.2 (H) 4.8 - 5.6 % Final    Comment:    (NOTE) Pre diabetes:          5.7%-6.4% Diabetes:              >6.4% Glycemic control for   <7.0% adults with diabetes     CBG: Recent Labs  Lab 11/07/24 1629 11/07/24 1953 11/07/24 2329 11/08/24 0308 11/08/24 0745  GLUCAP 160* 80 160* 152* 163*    Review of Systems:   Unable to assess due to intubation/sedation/critical illness   Past Medical History:  She,  has a past medical history of Bruises easily, Chronic respiratory failure with hypoxia (HCC), Cigarette smoker, Collapse of right lung, COPD (chronic obstructive pulmonary disease) (HCC), COVID-19 (07/2024), Dependence on supplemental oxygen , Depression with anxiety, Endometriosis, GERD (gastroesophageal reflux disease), Grade I diastolic dysfunction, History of sepsis, Hyperlipidemia, Hypertension (06/02/2012), Kidney  stones, Pulmonary HTN (HCC), Reactive airway disease (03/08/2014), and Vitamin D  deficiency.   Surgical History:   Past Surgical History:  Procedure Laterality Date   BRONCHIAL WASHINGS N/A 09/17/2024   Procedure: IRRIGATION, BRONCHUS;  Surgeon: Parris Manna, MD;  Location: [facility] ORS;  Service: Thoracic;  Laterality: N/A;   CATARACT EXTRACTION W/PHACO Right 12/04/2023   Procedure: CATARACT EXTRACTION PHACO AND INTRAOCULAR LENS PLACEMENT (IOC) RIGHT  CLAREON VIVITY LENS;  Surgeon: Enola [name], MD;  Location: [facility];  Service: Ophthalmology;  Laterality: Right;  10.40 1:04.0   CATARACT EXTRACTION W/PHACO Left 12/18/2023   Procedure: CATARACT EXTRACTION PHACO AND INTRAOCULAR LENS PLACEMENT (IOC) LEFT  CLAREON VIVITY TORIC 16.28, 01:19.2;  Surgeon: Enola [name], MD;  Location: [facility];  Service: Ophthalmology;  Laterality: Left;   COLONOSCOPY WITH PROPOFOL  N/A 01/02/2023   Procedure: COLONOSCOPY WITH PROPOFOL ;  Surgeon: Unk Corinn Skiff, MD;  Location: [facility];  Service: Gastroenterology;  Laterality: N/A;   ESOPHAGOGASTRODUODENOSCOPY N/A 07/29/2018   Procedure: ESOPHAGOGASTRODUODENOSCOPY (EGD);  Surgeon: Toledo, Ladell POUR, MD;  Location: [facility] ENDOSCOPY;  Service: Gastroenterology;  Laterality: N/A;   ESOPHAGOGASTRODUODENOSCOPY (EGD) WITH PROPOFOL  N/A 01/02/2023   Procedure: ESOPHAGOGASTRODUODENOSCOPY (EGD) WITH PROPOFOL ;  Surgeon: Unk Corinn Skiff, MD;  Location: [facility] ENDOSCOPY;  Service: Gastroenterology;  Laterality: N/A;   FLEXIBLE BRONCHOSCOPY N/A 09/17/2024   Procedure: BRONCHOSCOPY, FLEXIBLE;  Surgeon: Parris Manna, MD;  Location: [facility] ORS;  Service: Thoracic;  Laterality: N/A;   IR RADIOLOGIST EVAL & MGMT  10/19/2024   KIDNEY STONE SURGERY  RIGHT HEART CATH Right 04/21/2024   Procedure: RIGHT HEART CATH;  Surgeon: Ammon Blunt, MD;  Location: [facility];  Service: Cardiovascular;  Laterality: Right;    TONSILLECTOMY       Social History:   reports that she quit smoking about 10 months ago. Her smoking use included cigarettes. She started smoking about 50 years ago. She has a 26.8 pack-year smoking history. She has never used smokeless tobacco. She reports current alcohol use. She reports that she does not use drugs.   Family History:  Her family history includes GER disease in her father and mother; Heart attack in her father; Hyperlipidemia in her mother; Hypertension in her father and mother; Parkinson's disease in her father.   Allergies Allergies[1]   Home Medications  Prior to Admission medications  Medication Sig Start Date End Date Taking? Authorizing Provider  ALPRAZolam  (XANAX ) 0.25 MG tablet Take 1 tablet (0.25 mg total) by mouth 3 (three) times daily as needed for anxiety. 12/11/23  Yes Abernathy, Mardy, NP  ascorbic acid  (VITAMIN C ) 500 MG tablet Take 500 mg by mouth daily.   Yes [provider]  aspirin  81 MG tablet Take 81 mg by mouth at bedtime.    Yes [provider]  azithromycin  (ZITHROMAX ) 250 MG tablet Take 250 mg by mouth every other day. 10/06/24 11/14/24 Yes [provider]  BREZTRI  AEROSPHERE 160-9-4.8 MCG/ACT AERO inhaler Inhale 2 puffs into the lungs 2 (two) times daily. 04/12/24  Yes [provider]  buPROPion  (WELLBUTRIN ) 75 MG tablet Take 75 mg by mouth 2 (two) times daily.   Yes [provider]  Cholecalciferol  25 MCG (1000 UT) tablet Take 1,000 Units by mouth daily.    Yes [provider]  Coenzyme Q10 (CO Q-10) 100 MG CAPS Take 100 mg by mouth daily.   Yes [provider]  DENTA 5000 PLUS 1.1 % CREA dental cream Take 1 application  by mouth 2 (two) times daily as needed. 04/14/21  Yes [provider]  dextromethorphan-guaiFENesin  (MUCINEX  DM) 30-600 MG 12hr tablet Take 1 tablet by mouth 2 (two) times daily as needed for cough. 10/02/24  Yes Caleen Qualia, MD  DULoxetine  (CYMBALTA ) 60 MG  capsule Take 1 capsule (60 mg total) by mouth daily. 05/19/24  Yes Abernathy, Alyssa, NP  Dupilumab 300 MG/2ML SOAJ Inject 300 mg into the skin every 14 (fourteen) days. 01/27/24  Yes [provider]  EPINEPHrine  0.3 mg/0.3 mL IJ SOAJ injection Inject 0.3 mg into the muscle as needed. 01/27/24  Yes [provider]  famotidine  (PEPCID ) 40 MG tablet Take 1 tablet (40 mg total) by mouth 2 (two) times daily. 04/05/24  Yes Abernathy, Mardy, NP  fluticasone  (FLONASE ) 50 MCG/ACT nasal spray Place 2 sprays into both nostrils daily.   Yes [provider]  furosemide  (LASIX ) 40 MG tablet TAKE 1 TABLET BY MOUTH DAILY 04/12/24  Yes Abernathy, Alyssa, NP  gemfibrozil  (LOPID ) 600 MG tablet Take 1 tablet (600 mg total) by mouth 2 (two) times daily before a meal. 03/29/24  Yes Abernathy, Alyssa, NP  Ipratropium-Albuterol  (COMBIVENT  RESPIMAT) 20-100 MCG/ACT AERS respimat INHALE 1 PUFF INTO THE LUNGS EVERY 6 HOURS AS NEEDED FOR WHEEZING OR SHORTNESS OF BREATH 10/31/23  Yes Khan, Fozia M, MD  Magnesium  250 MG CAPS Take 250 mg by mouth daily.   Yes [provider]  metoprolol  tartrate (LOPRESSOR ) 25 MG tablet Take 0.5 tablets (12.5 mg total) by mouth 2 (two) times daily. 10/02/24  Yes Amin, Sumayya,  MD  Multiple Vitamins-Minerals (MULTIVITAMIN WITH MINERALS) tablet Take 1 tablet by mouth daily. Centrum Silver 50+   Yes [provider]  Potassium Chloride  CR (MICRO-K ) 8 MEQ CPCR capsule CR Take 1 capsule (8 mEq total) by mouth daily. 10/26/23  Yes Khan, Fozia M, MD  predniSONE  (DELTASONE ) 5 MG tablet Take by mouth. Take 9 tablets (45 mg total) by mouth once daily for 5 days, THEN 8 tablets (40 mg total) once daily for 5 days, THEN 7 tablets (35 mg total) once daily for 5 days, THEN 6 tablets (30 mg total) once daily for 5 days, THEN 5 tablets (25 mg total) once daily for 5 days, THEN 4 tablets (20 mg total) once daily for 5 days, THEN 3 tablets (15 mg total) once daily for 5 days, THEN 2  tablets (10 mg total) once daily for 5 days, THEN 1 tablet (5 mg total) once daily for 5 days, THEN 0.5 tablets (2.5 mg total) once daily for 5 days. 10/06/24 11/25/24 Yes [provider]  sucralfate  (CARAFATE ) 1 g tablet Take 1 g by mouth 4 (four) times daily as needed (Stomach).   Yes [provider]  vitamin E  180 MG (400 UNITS) capsule Take 400 Units by mouth daily.   Yes [provider]  colchicine 0.6 MG tablet Take 0.6 mg by mouth daily. Patient not taking: Reported on 11/04/2024 09/13/24   [provider]  feeding supplement (ENSURE PLUS HIGH PROTEIN) LIQD Take 237 mLs by mouth 2 (two) times daily between meals. 10/03/24   Amin, Sumayya, MD  OXYGEN  Inhale 2 L into the lungs continuous. Continuous oxygen  and NIV at night, 2 liters   pt uses [facility] for oxygen  supplies    [provider]     Critical care time: 40 minutes     Inge Lecher, AGACNP-BC [facility] Prefer epic messenger for cross cover needs If after hours, please call E-link       [1]  Allergies Allergen Reactions   Naproxen Rash and Other (See Comments)    Spouse confirmed that pt breaks out in hives head to toe.    Sulfa Antibiotics Hives and Rash    Sulfa products.

## 2024-11-09 DIAGNOSIS — K219 Gastro-esophageal reflux disease without esophagitis: Secondary | ICD-10-CM

## 2024-11-09 DIAGNOSIS — I10 Essential (primary) hypertension: Secondary | ICD-10-CM

## 2024-11-09 DIAGNOSIS — F418 Other specified anxiety disorders: Secondary | ICD-10-CM

## 2024-11-09 DIAGNOSIS — D649 Anemia, unspecified: Secondary | ICD-10-CM

## 2024-11-09 DIAGNOSIS — E785 Hyperlipidemia, unspecified: Secondary | ICD-10-CM

## 2024-11-09 LAB — CBC
HCT: 32.5 % — ABNORMAL LOW (ref 36.0–46.0)
Hemoglobin: 9.5 g/dL — ABNORMAL LOW (ref 12.0–15.0)
MCH: 27.2 pg (ref 26.0–34.0)
MCHC: 29.2 g/dL — ABNORMAL LOW (ref 30.0–36.0)
MCV: 93.1 fL (ref 80.0–100.0)
Platelets: 373 K/uL (ref 150–400)
RBC: 3.49 MIL/uL — ABNORMAL LOW (ref 3.87–5.11)
RDW: 15.2 % (ref 11.5–15.5)
WBC: 13.3 K/uL — ABNORMAL HIGH (ref 4.0–10.5)
nRBC: 0 % (ref 0.0–0.2)

## 2024-11-09 LAB — CULTURE, BLOOD (ROUTINE X 2)
Culture: NO GROWTH
Culture: NO GROWTH
Special Requests: ADEQUATE
Special Requests: ADEQUATE

## 2024-11-09 LAB — GLUCOSE, CAPILLARY
Glucose-Capillary: 141 mg/dL — ABNORMAL HIGH (ref 70–99)
Glucose-Capillary: 164 mg/dL — ABNORMAL HIGH (ref 70–99)
Glucose-Capillary: 87 mg/dL (ref 70–99)
Glucose-Capillary: 125 mg/dL — ABNORMAL HIGH (ref 70–99)
Glucose-Capillary: 193 mg/dL — ABNORMAL HIGH (ref 70–99)
Glucose-Capillary: 93 mg/dL (ref 70–99)

## 2024-11-09 LAB — BASIC METABOLIC PANEL WITH GFR
Anion gap: 9 (ref 5–15)
BUN: 51 mg/dL — ABNORMAL HIGH (ref 8–23)
CO2: 29 mmol/L (ref 22–32)
Calcium: 9.3 mg/dL (ref 8.9–10.3)
Chloride: 110 mmol/L (ref 98–111)
Creatinine, Ser: 0.68 mg/dL (ref 0.44–1.00)
GFR, Estimated: >60 mL/min (ref >60)
Glucose, Bld: 153 mg/dL — ABNORMAL HIGH (ref 70–99)
Potassium: 3.9 mmol/L (ref 3.5–5.1)
Sodium: 148 mmol/L — ABNORMAL HIGH (ref 135–145)

## 2024-11-09 LAB — PHOSPHORUS: Phosphorus: 3.5 mg/dL (ref 2.5–4.6)

## 2024-11-09 LAB — MAGNESIUM: Magnesium: 2.5 mg/dL — ABNORMAL HIGH (ref 1.7–2.4)

## 2024-11-09 MED ORDER — THIAMINE MONONITRATE 100 MG PO TABS
100.0000 mg | ORAL_TABLET | Freq: Every day | ORAL | Status: DC
  Administered 2024-11-10 – 2024-11-13 (×4): 100 mg via ORAL
  Filled 2024-11-09 (×6): qty 1

## 2024-11-09 MED ORDER — ARFORMOTEROL TARTRATE 15 MCG/2ML IN NEBU
15.0000 ug | INHALATION_SOLUTION | Freq: Two times a day (BID) | RESPIRATORY_TRACT | Status: DC
  Administered 2024-11-09 – 2024-11-15 (×13): 15 ug via RESPIRATORY_TRACT
  Filled 2024-11-09 (×13): qty 2

## 2024-11-09 MED ORDER — ASPIRIN 81 MG PO TBEC
81.0000 mg | DELAYED_RELEASE_TABLET | Freq: Every day | ORAL | Status: DC
  Administered 2024-11-09 – 2024-11-14 (×6): 81 mg via ORAL
  Filled 2024-11-09 (×6): qty 1

## 2024-11-09 MED ORDER — ADULT MULTIVITAMIN W/MINERALS CH
1.0000 | ORAL_TABLET | Freq: Every day | ORAL | Status: DC
  Administered 2024-11-10 – 2024-11-13 (×4): 1 via ORAL
  Filled 2024-11-09 (×6): qty 1

## 2024-11-09 MED ORDER — BUPROPION HCL 75 MG PO TABS
75.0000 mg | ORAL_TABLET | Freq: Two times a day (BID) | ORAL | Status: DC
  Administered 2024-11-09 – 2024-11-15 (×13): 75 mg via ORAL
  Filled 2024-11-09 (×13): qty 1

## 2024-11-09 MED ORDER — DULOXETINE HCL 30 MG PO CPEP
60.0000 mg | ORAL_CAPSULE | Freq: Every day | ORAL | Status: DC
  Administered 2024-11-09 – 2024-11-15 (×7): 60 mg via ORAL
  Filled 2024-11-09 (×7): qty 2

## 2024-11-09 MED ORDER — REVEFENACIN 175 MCG/3ML IN SOLN
175.0000 ug | Freq: Every day | RESPIRATORY_TRACT | Status: DC
  Administered 2024-11-09 – 2024-11-15 (×7): 175 ug via RESPIRATORY_TRACT
  Filled 2024-11-09 (×7): qty 3

## 2024-11-09 MED ORDER — VITAMIN C 500 MG PO TABS
500.0000 mg | ORAL_TABLET | Freq: Two times a day (BID) | ORAL | Status: DC
  Administered 2024-11-09 – 2024-11-14 (×10): 500 mg via ORAL
  Filled 2024-11-09 (×12): qty 1

## 2024-11-09 MED ORDER — GEMFIBROZIL 600 MG PO TABS
600.0000 mg | ORAL_TABLET | Freq: Two times a day (BID) | ORAL | Status: DC
  Administered 2024-11-10 – 2024-11-14 (×9): 600 mg via ORAL
  Filled 2024-11-09 (×13): qty 1

## 2024-11-09 MED ORDER — ORAL CARE MOUTH RINSE: 15.0000 mL | OROMUCOSAL | Status: DC | PRN

## 2024-11-09 MED ORDER — IPRATROPIUM-ALBUTEROL 0.5-2.5 (3) MG/3ML IN SOLN: 3.0000 mL | Freq: Four times a day (QID) | RESPIRATORY_TRACT | Status: DC

## 2024-11-09 MED ORDER — ENSURE PLUS HIGH PROTEIN PO LIQD
237.0000 mL | Freq: Three times a day (TID) | ORAL | Status: DC
  Administered 2024-11-10 – 2024-11-14 (×10): 237 mL via ORAL

## 2024-11-09 MED ORDER — METHYLPREDNISOLONE SODIUM SUCC 40 MG IJ SOLR
40.0000 mg | INTRAMUSCULAR | Status: DC
  Administered 2024-11-09 – 2024-11-10 (×2): 40 mg via INTRAVENOUS
  Filled 2024-11-09 (×2): qty 1

## 2024-11-09 MED ORDER — IPRATROPIUM-ALBUTEROL 0.5-2.5 (3) MG/3ML IN SOLN: 3.0000 mL | RESPIRATORY_TRACT | Status: DC | PRN

## 2024-11-09 NOTE — Progress Notes (Addendum)
 Nutrition Follow Up Note   DOCUMENTATION CODES:   Not applicable  INTERVENTION:   Ensure Plus High Protein po TID, each supplement provides 350 kcal and 20 grams of protein  MVI po daily   Vitamin C  500mg  po BID   Thiamine  100mg  po daily x 7 days   Pt at high refeed risk; recommend monitor potassium, magnesium  and phosphorus labs daily until stable  Daily weights   NUTRITION DIAGNOSIS:   Inadequate oral intake related to acute illness as evidenced by NPO status. -ongoing   GOAL:   Patient will meet greater than or equal to 90% of their needs -not met   MONITOR:   Diet advancement, Labs, Weight trends, Skin, I & O's  ASSESSMENT:   68 y/o female with h/o COPD, anxiety, depression, GERD, pulmonary hypertension, HLD, CHF and kidney stones who is admitted with COPD exacerbation.  Visited pt's room today. Husband is at bedside. Pt is more alert but continues to have increased work of breathing so only minimal history was obtained. Pt reports good appetite and oral intake at baseline. Pt is thirsty today and is asking for water . Pt with hypernatremia today. NGT removed. Pt seen by SLP today and was initiated on a pureed diet. RD discussed with pt the importance of adequate nutrition needed to preserve lean muscle. Pt is agreeable to chocolate Ensure. RD will add supplements and vitamins to help pt meet her estimated needs. Pt is at high refeed risk. No BM yet. Per chart, pt appears to be around her UBW currently.   Medications reviewed and include: aspirin , colace, lovenox , insulin , solu-medrol , protonix , miralax , zosyn   Labs reviewed: Na 148(H), K 3.9 wnl, BUN 51(H), P 3.5 wnl, Mg 2.5(H) Wbc- 13.3(H), Hgb 9.5(L), Hct 32.5(L) Cbgs- 87, 164, 141 x 24 hrs   UOP-   Diet Order:   Diet Order             Diet NPO time specified  Diet effective now                  EDUCATION NEEDS:   No education needs have been identified at this time  Skin:  Skin Assessment:  Reviewed RN Assessment (ecchymosis)  Last BM:  pta  Height:   Ht Readings from Last 1 Encounters:  11/05/24 5' 3 (1.6 m)    Weight:   Wt Readings from Last 1 Encounters:  11/09/24 61.3 kg    Ideal Body Weight:  52.3 kg  BMI:  Body mass index is 23.94 kg/m.  Estimated Nutritional Needs:   Kcal:  1600-1800kcal/day  Protein:  80-90g/day  Fluid:  1.4-1.6L/day  Rachael Shams MS, RD, LDN If unable to be reached, please send secure chat to RD inpatient available from 8:00a-4:00p daily

## 2024-11-09 NOTE — Progress Notes (Signed)
 NAME:  EMSLEE LOPEZMARTINEZ, MRN:  [id number], DOB:  Apr 24, 1956, LOS: 5 ADMISSION DATE:  11/04/2024, CONSULTATION DATE:  11/05/2024 CHIEF COMPLAINT:  Respiratory Failure    Brief Pt Description / Synopsis:  68 y.o female admitted with Acute on Chronic Hypoxic and Hypercapnic Respiratory Failure due to severe Acute COPD Exacerbation, failed trial of BiPAP requiring intubation and mechanical ventilation.   History of Present Illness:  Patient is a 68 year old female with a history of smoking and COPD with chronic hypoxic and hypercapnic respiratory failure who presents to the hospital with increased shortness of breath, cough, and wheezing consistent with a COPD exacerbation.   Patient reports symptom onset around 7 days ago with worsening shortness of breath, cough, wheezing, and increased exertional dyspnea.  She reports having increased oxygen  requirements and inability to walk or move short distances without having significant desaturation.  This further worsened over this week and she was unable to wait until her scheduled appointment with her primary pulmonologist prompting her to present to the ED.   EMS was activated and she was brought in with hypoxia and increased work of breathing.  She received IV steroids and was started on BiPAP for respiratory distress and admitted to the medicine service for further workup and management.  Patient started on IV Solu-Medrol , doxycycline , and received a dose of acetazolamide .  Given lack of improvement today, pulmonary and critical care medicine were consulted for increased risk of respiratory failure.   Medical chart reviewed, patient had a history of smoking and she quit 1 year ago.  Patient has been followed by outpatient pulmonary for severe COPD with her last FEV1 at 0.61 L (29% predicted).  She has chronic hypoxia for which she was started on supplemental oxygen .   Patient was admitted to the hospital in January 2025 with acute on chronic hypoxic  respiratory failure with ICU admission.  She was continued on nocturnal BiPAP after send admission and switched to triple therapy with Breztri  and Ensifentrine  was considered.  She was started on Dupixent to reduce frequency of COPD exacerbations early in 2025.  She was further referred to cardiology for consideration of a right heart catheterization for workup of pulmonary hypertension.   Right heart cath 04/21/2024 showed RA 10/6, PA 40/12 (24), and PCWP of 20 not indicative of pulmonary hypertension.   CT chest ruled out PE in October 2025 after an exacerbation.  This was notable for unchanged complete collapse of the right middle lobe with atelectasis in the lingula.  She then underwent bronchoscopy with BAL of the right middle lobe with cultures returning negative.   Patient then admitted in November 2025 with COPD exacerbation treated with antibiotics and discharged on levofloxacin  as well as a prednisone  taper.  Patient seen by her pulmonologist in early November where her prednisone  taper was extended (taper by 5 mg every 5 days).  She was also started on thrice weekly azithromycin .  TRH asked to admit for further workup and treatment.  Please see Significant Hospital Events section below for full detailed hospital course.   Pertinent  Medical History   Past Medical History:  Diagnosis Date   Bruises easily    Chronic respiratory failure with hypoxia (HCC)    Cigarette smoker    Collapse of right lung    COPD (chronic obstructive pulmonary disease) (HCC)    COVID-19 07/2024   Dependence on supplemental oxygen     Depression with anxiety    Endometriosis    GERD (gastroesophageal reflux disease)  Grade I diastolic dysfunction    History of sepsis    Hyperlipidemia    Hypertension 06/02/2012   Kidney stones    Pulmonary HTN (HCC)    Reactive airway disease 03/08/2014   Vitamin D  deficiency     Micro Data:  12/11: COVID/FLU/RSV PCR>> negative 12/11; Blood cultures x2>>  no growth to date 12/12: RVP>> negative  12/12: Strep pneumo urinary antigen>> negative  12/12: MRSA PCR>>negative  12/13: Tracheal aspirate>>  Antimicrobials:   Anti-infectives (From admission, onward)    Start     Dose/Rate Route Frequency Ordered Stop   11/07/24 1400  piperacillin -tazobactam (ZOSYN ) IVPB 3.375 g        3.375 g 12.5 mL/hr over 240 Minutes Intravenous Every 8 hours 11/07/24 1026     11/05/24 1430  ceFEPIme  (MAXIPIME ) 2 g in sodium chloride  0.9 % 100 mL IVPB  Status:  Discontinued        2 g 200 mL/hr over 30 Minutes Intravenous Every 12 hours 11/05/24 1322 11/07/24 1009   11/04/24 2315  doxycycline  (VIBRAMYCIN ) 100 mg in sodium chloride  0.9 % 250 mL IVPB        100 mg 125 mL/hr over 120 Minutes Intravenous 2 times daily 11/04/24 2300     11/04/24 1445  doxycycline  (VIBRA -TABS) tablet 100 mg  Status:  Discontinued        100 mg Oral Every 12 hours 11/04/24 1432 11/04/24 2304   11/04/24 1130  cefTRIAXone  (ROCEPHIN ) 2 g in sodium chloride  0.9 % 100 mL IVPB        2 g 200 mL/hr over 30 Minutes Intravenous Once 11/04/24 1121 11/04/24 1210   11/04/24 1130  azithromycin  (ZITHROMAX ) 500 mg in sodium chloride  0.9 % 250 mL IVPB        500 mg 250 mL/hr over 60 Minutes Intravenous  Once 11/04/24 1121 11/04/24 1314       Significant Hospital Events: Including procedures, antibiotic start and stop dates in addition to other pertinent events   11/04/24: Admitted with COPD exacerbation, on BiPAP 11/05/24: Consult to critical care for respiratory failure and risk of decompensation and need for intubation and mechanical ventilation. Intubated at night 11/06/24: On dexmedetomidine  and fentanyl  for analgesia and sedation. Synchronous with the vent. Family updated at bedside at length 11/07/24: WUA this AM, agitated on dexmedetomidine  (prop/fentanyl  dc'd). Attempt SBT but failed due to sever anxiety 11/08/24; No significant events overnight. Afebrile, hemodynamically stable, off  Levophed .  On minimal vent support, plan for WUA and SBT utilizing Precedex .  EXTUBATED to HFNC, respiratory status remains tenuous.  Interim History / Subjective:  Extubated yesterday Patient on HHFNC 30% 45 l/m Patient states breathing is fine. Some somewhat breathless when trying to speak. Rr upper 20s.  Per nurse patient gets anxious and pulls at things. On 1 precedex   Objective   Blood pressure 129/72, pulse 89, temperature (!) 97.1 F (36.2 C), temperature source Axillary, resp. rate (!) 21, height 5' 3 (1.6 m), weight 61.3 kg, SpO2 97%.    Vent Mode: PSV;Spontaneous FiO2 (%):  [34 %-40 %] 35 % PEEP:  [5 cmH20] 5 cmH20 Pressure Support:  [5 cmH20-6 cmH20] 6 cmH20   Intake/Output Summary (Last 24 hours) at 11/09/2024 0744 Last data filed at 11/09/2024 0700 Gross per 24 hour  Intake 1486.87 ml  Output 1260 ml  Net 226.87 ml   Filed Weights   11/07/24 0500 11/08/24 0500 11/09/24 0429  Weight: 62.3 kg 64 kg 61.3 kg    Examination: General: elderly  ill appearing female on HHFNC HEENT: MM pink/moist; HHFNC in place Neuro: Aox3; MAE CV: s1s2, RRR, no m/r/g PULM:  dim clear BS bilaterally; HHFNC 30% fio2 45 l/m sats mid 90s GI: soft, bsx4 active  Extremities: warm/dry, no edema  Skin: no rashes or lesions   Resolved Hospital Problem list     Assessment & Plan:   Acute on Chronic Hypoxic & Hypercapnic Respiratory Failure COPD Exacerbation Chronic Atelectasis of RML PMHx: COPD on Breztri  and Dupilumab -extubated on 12/15 Plan: -currently on HHFNC; consider weaning to salter [place]; wean for sats >92% -bipap at bedtime and prn; trying to find out what specific home settings she has so we can mimic then while she is here -triple therapy maintenance nebs; prn duoneb -changed steroids from q12 to daily; eventually taper -pulm toiletry: is/flutter -pt/ot -zosyn  for 7 days total; doxy completed  Hx of depression/anxiety Plan: -currently on precedex  -will evaluate  swallowing with slp -valium  bid; on xanax  at home currently on hold -add home wellbutrin , cymbalta   Anemia Plan: -trend cbc  GERD Plan: -ppi  HLD/HTN Grade I diastolic dysfunction on echo 11/2023 Plan: -resume home lopid  -hold metoprolol  while normotensive -monitor uop; consider resuming home lasix     Best Practice (right click and Reselect all SmartList Selections daily)   Diet/type: NPO; slp eval today DVT prophylaxis: LMWH GI prophylaxis: PPI Lines: Central line and Arterial Line; remove a-line; possibly cvl if not needing pressors Foley:  removal ordered  Code Status:  full code Last date of multidisciplinary goals of care discussion [12/15]  12/15: Pt's husband updated at bedside on plan of care.  Labs   CBC: Recent Labs  Lab 11/04/24 1132 11/05/24 1212 11/06/24 0549 11/07/24 0515 11/08/24 0315 11/09/24 0335  WBC 20.2* 19.1* 16.5* 12.3* 13.4* 13.3*  NEUTROABS 13.3*  --   --   --   --   --   HGB 13.2 11.6* 9.9* 9.1* 8.8* 9.5*  HCT 43.1 38.4 32.2* 29.7* 29.0* 32.5*  MCV 88.9 89.3 89.7 89.2 90.9 93.1  PLT 639* 539* 442* 372 371 373    Basic Metabolic Panel: Recent Labs  Lab 11/05/24 1212 11/06/24 0549 11/07/24 0515 11/08/24 0315 11/09/24 0335  NA 143 141 144 144 148*  K 4.2 3.5 4.6 4.5 3.9  CL 103 103 109 110 110  CO2 24 27 28 28 29   GLUCOSE 135* 170* 204* 161* 153*  BUN 26* 41* 47* 53* 51*  CREATININE 0.67 0.66 0.59 0.58 0.68  CALCIUM 9.6 9.0 8.6* 9.0 9.3  MG  --   --  2.3 2.3 2.5*  PHOS 2.6 2.5 2.0* 2.6 3.5   GFR: Estimated Creatinine Clearance: 54.1 mL/min (by C-G formula based on SCr of 0.68 mg/dL). Recent Labs  Lab 11/04/24 1132 11/04/24 1350 11/05/24 1212 11/06/24 0549 11/07/24 0515 11/08/24 0315 11/09/24 0335  PROCALCITON  --   --  0.24  --   --   --   --   WBC 20.2*  --  19.1* 16.5* 12.3* 13.4* 13.3*  LATICACIDVEN 2.5* 1.0  --   --   --   --   --     Liver Function Tests: Recent Labs  Lab 11/04/24 1132 11/05/24 1212  11/06/24 0549 11/07/24 0515 11/08/24 0315  AST 25  --   --   --   --   ALT 51*  --   --   --   --   ALKPHOS 138*  --   --   --   --  BILITOT 0.4  --   --   --   --   PROT 8.9*  --   --   --   --   ALBUMIN 4.4 4.2 3.5 3.3* 3.3*   No results for input(s): LIPASE, AMYLASE in the last 168 hours. No results for input(s): AMMONIA in the last 168 hours.  ABG    Component Value Date/Time   PHART 7.28 (L) 11/06/2024 0045   PCO2ART 69 (HH) 11/06/2024 0045   PO2ART 98 11/06/2024 0045   HCO3 32.4 (H) 11/06/2024 0045   O2SAT 98.8 11/06/2024 0045     Coagulation Profile: No results for input(s): INR, PROTIME in the last 168 hours.  Cardiac Enzymes: No results for input(s): CKTOTAL, CKMB, CKMBINDEX, TROPONINI in the last 168 hours.  HbA1C: Hemoglobin A1C  Date/Time Value Ref Range Status  12/11/2023 04:01 PM 6.2 (A) 4.0 - 5.6 % Final   Hgb A1c MFr Bld  Date/Time Value Ref Range Status  05/22/2018 12:02 AM 6.2 (H) 4.8 - 5.6 % Final    Comment:    (NOTE) Pre diabetes:          5.7%-6.4% Diabetes:              >6.4% Glycemic control for   <7.0% adults with diabetes     CBG: Recent Labs  Lab 11/08/24 1536 11/08/24 1921 11/08/24 2303 11/09/24 0315 11/09/24 0736  GLUCAP 142* 148* 117* 141* 164*    Review of Systems:   Unable to assess due to intubation/sedation/critical illness   Past Medical History:  She,  has a past medical history of Bruises easily, Chronic respiratory failure with hypoxia (HCC), Cigarette smoker, Collapse of right lung, COPD (chronic obstructive pulmonary disease) (HCC), COVID-19 (07/2024), Dependence on supplemental oxygen , Depression with anxiety, Endometriosis, GERD (gastroesophageal reflux disease), Grade I diastolic dysfunction, History of sepsis, Hyperlipidemia, Hypertension (06/02/2012), Kidney stones, Pulmonary HTN (HCC), Reactive airway disease (03/08/2014), and Vitamin D  deficiency.   Surgical History:   Past Surgical  History:  Procedure Laterality Date   BRONCHIAL WASHINGS N/A 09/17/2024   Procedure: IRRIGATION, BRONCHUS;  Surgeon: Parris Manna, MD;  Location: [facility] ORS;  Service: Thoracic;  Laterality: N/A;   CATARACT EXTRACTION W/PHACO Right 12/04/2023   Procedure: CATARACT EXTRACTION PHACO AND INTRAOCULAR LENS PLACEMENT (IOC) RIGHT  CLAREON VIVITY LENS;  Surgeon: Enola [name], MD;  Location: [facility];  Service: Ophthalmology;  Laterality: Right;  10.40 1:04.0   CATARACT EXTRACTION W/PHACO Left 12/18/2023   Procedure: CATARACT EXTRACTION PHACO AND INTRAOCULAR LENS PLACEMENT (IOC) LEFT  CLAREON VIVITY TORIC 16.28, 01:19.2;  Surgeon: Enola [name], MD;  Location: [facility];  Service: Ophthalmology;  Laterality: Left;   COLONOSCOPY WITH PROPOFOL  N/A 01/02/2023   Procedure: COLONOSCOPY WITH PROPOFOL ;  Surgeon: Unk Corinn Skiff, MD;  Location: [facility] ENDOSCOPY;  Service: Gastroenterology;  Laterality: N/A;   ESOPHAGOGASTRODUODENOSCOPY N/A 07/29/2018   Procedure: ESOPHAGOGASTRODUODENOSCOPY (EGD);  Surgeon: Toledo, Ladell POUR, MD;  Location: [facility] ENDOSCOPY;  Service: Gastroenterology;  Laterality: N/A;   ESOPHAGOGASTRODUODENOSCOPY (EGD) WITH PROPOFOL  N/A 01/02/2023   Procedure: ESOPHAGOGASTRODUODENOSCOPY (EGD) WITH PROPOFOL ;  Surgeon: Unk Corinn Skiff, MD;  Location: [facility] ENDOSCOPY;  Service: Gastroenterology;  Laterality: N/A;   FLEXIBLE BRONCHOSCOPY N/A 09/17/2024   Procedure: BRONCHOSCOPY, FLEXIBLE;  Surgeon: Parris Manna, MD;  Location: [facility] ORS;  Service: Thoracic;  Laterality: N/A;   IR RADIOLOGIST EVAL & MGMT  10/19/2024   KIDNEY STONE SURGERY     RIGHT HEART CATH Right 04/21/2024   Procedure: RIGHT HEART CATH;  Surgeon: Ammon Blunt, MD;  Location: [facility] INVASIVE CV LAB;  Service: Cardiovascular;  Laterality: Right;   TONSILLECTOMY       Social History:   reports that she quit smoking about 10 months ago. Her smoking use included cigarettes. She started  smoking about 50 years ago. She has a 26.8 pack-year smoking history. She has never used smokeless tobacco. She reports current alcohol use. She reports that she does not use drugs.   Family History:  Her family history includes GER disease in her father and mother; Heart attack in her father; Hyperlipidemia in her mother; Hypertension in her father and mother; Parkinson's disease in her father.   Allergies Allergies[1]   Home Medications  Prior to Admission medications  Medication Sig Start Date End Date Taking? Authorizing Provider  ALPRAZolam  (XANAX ) 0.25 MG tablet Take 1 tablet (0.25 mg total) by mouth 3 (three) times daily as needed for anxiety. 12/11/23  Yes Abernathy, Mardy, NP  ascorbic acid  (VITAMIN C ) 500 MG tablet Take 500 mg by mouth daily.   Yes [provider]  aspirin  81 MG tablet Take 81 mg by mouth at bedtime.    Yes [provider]  azithromycin  (ZITHROMAX ) 250 MG tablet Take 250 mg by mouth every other day. 10/06/24 11/14/24 Yes [provider]  BREZTRI  AEROSPHERE 160-9-4.8 MCG/ACT AERO inhaler Inhale 2 puffs into the lungs 2 (two) times daily. 04/12/24  Yes [provider]  buPROPion  (WELLBUTRIN ) 75 MG tablet Take 75 mg by mouth 2 (two) times daily.   Yes [provider]  Cholecalciferol  25 MCG (1000 UT) tablet Take 1,000 Units by mouth daily.    Yes [provider]  Coenzyme Q10 (CO Q-10) 100 MG CAPS Take 100 mg by mouth daily.   Yes [provider]  DENTA 5000 PLUS 1.1 % CREA dental cream Take 1 application  by mouth 2 (two) times daily as needed. 04/14/21  Yes [provider]  dextromethorphan-guaiFENesin  (MUCINEX  DM) 30-600 MG 12hr tablet Take 1 tablet by mouth 2 (two) times daily as needed for cough. 10/02/24  Yes Caleen Qualia, MD  DULoxetine  (CYMBALTA ) 60 MG capsule Take 1 capsule (60 mg total) by mouth daily. 05/19/24  Yes Abernathy, Alyssa, NP  Dupilumab 300 MG/2ML SOAJ Inject 300 mg into the skin  every 14 (fourteen) days. 01/27/24  Yes [provider]  EPINEPHrine  0.3 mg/0.3 mL IJ SOAJ injection Inject 0.3 mg into the muscle as needed. 01/27/24  Yes [provider]  famotidine  (PEPCID ) 40 MG tablet Take 1 tablet (40 mg total) by mouth 2 (two) times daily. 04/05/24  Yes Abernathy, Mardy, NP  fluticasone  (FLONASE ) 50 MCG/ACT nasal spray Place 2 sprays into both nostrils daily.   Yes [provider]  furosemide  (LASIX ) 40 MG tablet TAKE 1 TABLET BY MOUTH DAILY 04/12/24  Yes Abernathy, Alyssa, NP  gemfibrozil  (LOPID ) 600 MG tablet Take 1 tablet (600 mg total) by mouth 2 (two) times daily before a meal. 03/29/24  Yes Abernathy, Alyssa, NP  Ipratropium-Albuterol  (COMBIVENT  RESPIMAT) 20-100 MCG/ACT AERS respimat INHALE 1 PUFF INTO THE LUNGS EVERY 6 HOURS AS NEEDED FOR WHEEZING OR SHORTNESS OF BREATH 10/31/23  Yes Khan, Fozia M, MD  Magnesium  250 MG CAPS Take 250 mg by mouth daily.   Yes [provider]  metoprolol  tartrate (LOPRESSOR ) 25 MG tablet Take 0.5 tablets (12.5 mg total) by mouth 2 (two) times daily. 10/02/24  Yes Amin, Sumayya, MD  Multiple Vitamins-Minerals (MULTIVITAMIN WITH MINERALS) tablet Take 1 tablet by  mouth daily. Centrum Silver 50+   Yes [provider]  Potassium Chloride  CR (MICRO-K ) 8 MEQ CPCR capsule CR Take 1 capsule (8 mEq total) by mouth daily. 10/26/23  Yes Khan, Fozia M, MD  predniSONE  (DELTASONE ) 5 MG tablet Take by mouth. Take 9 tablets (45 mg total) by mouth once daily for 5 days, THEN 8 tablets (40 mg total) once daily for 5 days, THEN 7 tablets (35 mg total) once daily for 5 days, THEN 6 tablets (30 mg total) once daily for 5 days, THEN 5 tablets (25 mg total) once daily for 5 days, THEN 4 tablets (20 mg total) once daily for 5 days, THEN 3 tablets (15 mg total) once daily for 5 days, THEN 2 tablets (10 mg total) once daily for 5 days, THEN 1 tablet (5 mg total) once daily for 5 days, THEN 0.5 tablets (2.5 mg total) once daily for 5  days. 10/06/24 11/25/24 Yes [provider]  sucralfate  (CARAFATE ) 1 g tablet Take 1 g by mouth 4 (four) times daily as needed (Stomach).   Yes [provider]  vitamin E  180 MG (400 UNITS) capsule Take 400 Units by mouth daily.   Yes [provider]  colchicine 0.6 MG tablet Take 0.6 mg by mouth daily. Patient not taking: Reported on 11/04/2024 09/13/24   [provider]  feeding supplement (ENSURE PLUS HIGH PROTEIN) LIQD Take 237 mLs by mouth 2 (two) times daily between meals. 10/03/24   Amin, Sumayya, MD  OXYGEN  Inhale 2 L into the lungs continuous. Continuous oxygen  and NIV at night, 2 liters   pt uses [facility] for oxygen  supplies    [provider]     Critical care time: 35 minutes     JD Emilio RIGGERS [facility] 11/09/2024, 7:48 AM  Please see [email or web address] for pager details.  From 7A-7P if no response, please call [phone]. After hours, please call ELink [phone].         [1]  Allergies Allergen Reactions   Naproxen Rash and Other (See Comments)    Spouse confirmed that pt breaks out in hives head to toe.    Sulfa Antibiotics Hives and Rash    Sulfa products.

## 2024-11-09 NOTE — Progress Notes (Signed)
 Pt's 3 finger rings given to husband Mr Rachael Blankenship at bedside to take home due to pt's fingers swelling and per pt request.

## 2024-11-09 NOTE — TOC Initial Note (Signed)
 Transition of Care [facility]) - Initial/Assessment Note    Patient Details  Name: Rachael Blankenship MRN: [id number] Date of Birth: 09-19-1956  Transition of Care [facility]) CM/SW Contact:    Rachael [name], LCSW Phone Number: 11/09/2024, 11:29 AM  Clinical Narrative:   Readmission prevention screen complete. Patient not fully oriented. Husband and sister at bedside. CSW introduced role and explained that discharge planning would be discussed. PCP is Rachael Sage, MD. Husband drives her to appointments. She uses [facility]. No issues affording medications. Patient lives home with husband. Daughter checks in daily as well. No home health prior to admission. Husband stated that it had previously been ordered but patient wasn't feeling up to it. Husband is agreeable to home health and confirmed he would only want to take her home at discharge. He is interested in someone coming out to the home to help bathe her. Per chart review, referral was given to [facility] on 11/8. Patient has a shower chair, home oxygen  and bipap ([facility]). Husband is interested in a wheelchair. Will request PT and OT consults whenever appropriate. No further concerns. CSW will continue to follow patient and her husband for support and facilitate return home once stable.              Expected Discharge Plan: Home w Home Health Services Barriers to Discharge: Continued Medical Work up   Patient Goals and CMS Choice            Expected Discharge Plan and Services     Post Acute Care Choice: Home Health Living arrangements for the past 2 months: Single Family Home                                      Prior Living Arrangements/Services Living arrangements for the past 2 months: Single Family Home Lives with:: Spouse Patient language and need for interpreter reviewed:: Yes Do you feel safe going back to the place where you live?: Yes      Need for Family Participation in Patient Care: Yes  (Comment) Care giver support system in place?: Yes (comment) Current home services: DME Criminal Activity/Legal Involvement Pertinent to Current Situation/Hospitalization: No - Comment as needed  Activities of Daily Living   ADL Screening (condition at time of admission) Independently performs ADLs?: Yes (appropriate for developmental age) Is the patient deaf or have difficulty hearing?: No Does the patient have difficulty seeing, even when wearing glasses/contacts?: No Does the patient have difficulty concentrating, remembering, or making decisions?: No  Permission Sought/Granted Permission sought to share information with : Facility Medical Sales Representative    Share Information with NAME: Rachael Blankenship     Permission granted to share info w Relationship: Husband  Permission granted to share info w Contact Information: [phone]  Emotional Assessment Appearance:: Appears stated age Attitude/Demeanor/Rapport: Unable to Assess Affect (typically observed): Unable to Assess Orientation: : Oriented to Self Alcohol / Substance Use: Not Applicable Psych Involvement: No (comment)  Admission diagnosis:  COPD (chronic obstructive pulmonary disease) (HCC) [J44.9] COPD exacerbation (HCC) [J44.1] Acute on chronic respiratory failure with hypoxia (HCC) [J96.21] Patient Active Problem List   Diagnosis Date Noted   COPD (chronic obstructive pulmonary disease) (HCC) 11/04/2024   Hypermagnesemia 10/02/2024   Respiratory distress 10/02/2024   COPD exacerbation (HCC) 09/29/2024   Acute on chronic respiratory failure with hypoxia and hypercapnia (HCC) 09/29/2024   Chronic diastolic CHF (congestive heart  failure) (HCC) 09/29/2024   Depression with anxiety 09/29/2024   Hypokalemia 09/29/2024   AKI (acute kidney injury) 09/29/2024   Influenza A 12/26/2023   Elevated troponin 12/26/2023   Sepsis (HCC) 12/26/2023   Streptococcus pneumoniae pneumonia 12/26/2023   Acute on chronic respiratory  failure with hypoxia (HCC) 12/21/2023   Acute respiratory failure with hypoxia (HCC) 12/20/2023   Chronic respiratory failure with hypoxia (HCC) 10/08/2023   Primary osteoarthritis involving multiple joints 10/08/2023   Upper abdominal pain 01/02/2023   Generalized abdominal pain 01/02/2023   COPD with acute exacerbation (HCC) 01/21/2022   Erythrocytosis 01/21/2022   Tobacco abuse 01/21/2022   History of COVID-19 12/11/2020   Encounter for general adult medical examination with abnormal findings 09/17/2020   Suprapubic abdominal pain 09/17/2020   Encounter for screening mammogram for malignant neoplasm of breast 09/17/2020   Chronic obstructive pulmonary disease (HCC) 05/12/2020   Age-related osteoporosis without current pathological fracture 04/05/2020   Need for vaccination against Streptococcus pneumoniae using pneumococcal conjugate vaccine 13 03/29/2019   Cigarette smoker 11/26/2018   Obstructive chronic bronchitis without exacerbation (HCC) 08/25/2018   Gastroesophageal reflux disease without esophagitis 08/25/2018   Black stool 08/25/2018   Needs flu shot 08/25/2018   Acute hypoxemic respiratory failure (HCC) 05/21/2018   Screening for osteoporosis 05/13/2018   Acute upper respiratory infection 05/13/2018   Other fatigue 05/13/2018   Generalized anxiety disorder 05/13/2018   Vitamin D  deficiency 05/13/2018   Screening for malignant neoplasm of cervix 05/13/2018   Dysuria 05/13/2018   Chest pain 01/18/2015   Shortness of breath 01/18/2015   Hyperlipemia 03/08/2014   Pulmonary HTN (HCC) 03/08/2014   Reactive airway disease 03/08/2014   Hypertension 06/02/2012   PCP:  Rachael Rachael JINNY DOUGLAS, MD Pharmacy:   [facility] - [place], [place] - [address] [place] [address] Phone: [phone] Fax: [phone]  [id number] [place], [place] - [address] AT [facility] [address] [place] [address] [place] [address] Phone:  [phone] Fax: [phone]  [facility] - [place], [place] - [address] [facility]. [address] [employer] [place] [address] Phone: [phone] Fax: [phone]     Social Drivers of Health (SDOH) Social History: SDOH Screenings   Food Insecurity: No Food Insecurity (11/05/2024)  Housing: Low Risk (11/05/2024)  Transportation Needs: No Transportation Needs (11/05/2024)  Utilities: Not At Risk (11/05/2024)  Alcohol Screen: Low Risk (06/24/2022)  Depression (PHQ2-9): Low Risk (03/24/2023)  Financial Resource Strain: Low Risk  (10/19/2024)   Received from [facility]  Social Connections: Socially Integrated (11/05/2024)  Tobacco Use: Medium Risk (10/19/2024)   Received from [facility]   SDOH Interventions:     Readmission Risk Interventions    11/09/2024   11:20 AM  Readmission Risk Prevention Plan  Transportation Screening Complete  PCP or Specialist Appt within 3-5 Days Complete  Social Work Consult for Recovery Care Planning/Counseling Complete  Palliative Care Screening Not Applicable  Medication Review Oceanographer) Complete

## 2024-11-09 NOTE — Progress Notes (Signed)
 PHARMACY CONSULT NOTE - FOLLOW UP  Pharmacy Consult for Electrolyte Monitoring and Replacement   Recent Labs: Potassium (mmol/L)  Date Value  11/09/2024 3.9   Magnesium  (mg/dL)  Date Value  87/83/7974 2.5 (H)   Calcium (mg/dL)  Date Value  87/83/7974 9.3   Albumin (g/dL)  Date Value  87/84/7974 3.3 (L)  11/21/2023 4.4   Phosphorus (mg/dL)  Date Value  87/83/7974 3.5   Sodium (mmol/L)  Date Value  11/09/2024 148 (H)  11/21/2023 141    Assessment: 68 year old female with a history of smoking and COPD with chronic hypoxic and hypercapnic respiratory failure who presents to the hospital with increased shortness of breath, cough, and wheezing. Pharmacy is asked to follow and replace electrolytes while in CCU.   Goal of Therapy:  Electrolytes WNL  Plan:  ---no electrolyte replacement warranted for today ---recheck electrolytes in am  Rachael Blankenship ,PharmD Clinical Pharmacist 11/09/2024 7:04 AM

## 2024-11-09 NOTE — Evaluation (Signed)
 Clinical/Bedside Swallow Evaluation Patient Details  Name: Rachael Blankenship MRN: [id number] Date of Birth: [birth year]  Today's Date: 11/09/2024 Time: SLP Start Time (ACUTE ONLY): 1130 SLP Stop Time (ACUTE ONLY): 1230 SLP Time Calculation (min) (ACUTE ONLY): 60 min  Past Medical History:  Past Medical History:  Diagnosis Date   Bruises easily    Chronic respiratory failure with hypoxia (HCC)    Cigarette smoker    Collapse of right lung    COPD (chronic obstructive pulmonary disease) (HCC)    COVID-19 07/2024   Dependence on supplemental oxygen     Depression with anxiety    Endometriosis    GERD (gastroesophageal reflux disease)    Grade I diastolic dysfunction    History of sepsis    Hyperlipidemia    Hypertension 06/02/2012   Kidney stones    Pulmonary HTN (HCC)    Reactive airway disease 03/08/2014   Vitamin D  deficiency    Past Surgical History:  Past Surgical History:  Procedure Laterality Date   BRONCHIAL WASHINGS N/A 09/17/2024   Procedure: IRRIGATION, BRONCHUS;  Surgeon: Parris Manna, MD;  Location: [facility];  Service: Thoracic;  Laterality: N/A;   CATARACT EXTRACTION W/PHACO Right 12/04/2023   Procedure: CATARACT EXTRACTION PHACO AND INTRAOCULAR LENS PLACEMENT (IOC) RIGHT  CLAREON VIVITY LENS;  Surgeon: Enola [name], MD;  Location: [facility];  Service: Ophthalmology;  Laterality: Right;  10.40 1:04.0   CATARACT EXTRACTION W/PHACO Left 12/18/2023   Procedure: CATARACT EXTRACTION PHACO AND INTRAOCULAR LENS PLACEMENT (IOC) LEFT  CLAREON VIVITY TORIC 16.28, 01:19.2;  Surgeon: Enola [name], MD;  Location: [facility];  Service: Ophthalmology;  Laterality: Left;   COLONOSCOPY WITH PROPOFOL  N/A 01/02/2023   Procedure: COLONOSCOPY WITH PROPOFOL ;  Surgeon: Unk Corinn Skiff, MD;  Location: [facility];  Service: Gastroenterology;  Laterality: N/A;   ESOPHAGOGASTRODUODENOSCOPY N/A 07/29/2018   Procedure: ESOPHAGOGASTRODUODENOSCOPY (EGD);   Surgeon: Toledo, Ladell MARLA, MD;  Location: [facility];  Service: Gastroenterology;  Laterality: N/A;   ESOPHAGOGASTRODUODENOSCOPY (EGD) WITH PROPOFOL  N/A 01/02/2023   Procedure: ESOPHAGOGASTRODUODENOSCOPY (EGD) WITH PROPOFOL ;  Surgeon: Unk Corinn Skiff, MD;  Location: [facility];  Service: Gastroenterology;  Laterality: N/A;   FLEXIBLE BRONCHOSCOPY N/A 09/17/2024   Procedure: BRONCHOSCOPY, FLEXIBLE;  Surgeon: Parris Manna, MD;  Location: [facility];  Service: Thoracic;  Laterality: N/A;   IR RADIOLOGIST EVAL & MGMT  10/19/2024   KIDNEY STONE SURGERY     RIGHT HEART CATH Right 04/21/2024   Procedure: RIGHT HEART CATH;  Surgeon: Ammon Blunt, MD;  Location: [facility];  Service: Cardiovascular;  Laterality: Right;   TONSILLECTOMY     HPI:  Pt is a 68 y.o. female with medical history significant of COPD Gold stage IV, chronic hypoxic and hypercapnic respiratory failure on as needed oxygen  and nocturnal BiPAP, GERD, HTN, chronic HFpEF, pulmonary hypertension, HLD, presented with worsening of cough wheezing shortness of breath.  Symptoms started about 7 days ago when patient started to have a dry cough wheezing and increasing exertional dyspnea.  Denies any fever chills no chest pains.  Was started on additional albuterol  inhaler on Monday however patient did not tolerate albuterol  inhaler as she felt  palpitations and chest tightness as a result her breathing symptoms has progressively getting worse over the last 3 days.  No fever or chills.  intial CXR: Negative for acute infiltrates.  Patient developed worsening respiratory distress with increased work of breathing and failure to maintain adequate ventilation despite noninvasive support so decision was made to intubate  on 12/13; extubated on 12/15.  Currrently on HFNC O2 support.    Assessment / Plan / Recommendation  Clinical Impression   Pt seen for BSE this date. Pt awake, verbal but min confused. Noted Mitts on hands-  NSG removed while Family was in room. Pt w/ noted Pulmonary decline: on HFNC O2 support and w/ clavicular breathing noted at rest.  On HFNC O2 support 40L w/ FIO235%; afebrile. WBC elevated.  Pt presents w/ general oropharyngeal phase dysphagia and risk for aspiration at this time. Pt presents w/ increased risk for aspiration in setting of significantly declined-appearing Pulmonary status as per presentation and chart notes. Pt requires MOD+ support w/ feeding of po's and positioning. Frequent REST BREAKS required during any/all tasks including po intake d/t WOB/SOB secondary to exertion.  ANY such significant Pulmonary decline can impact Apnea timing and airway closure during the swallow which can impact pharyngeal swallowing, airway protection, and thus increase risk for aspiration to occur which can further Pulmonary decline.    During this evaluation, trials of ice chips, thin liquids Via Cup, then small tsps of purees were presented w/ REST BREAKS b/t trials and careful monitoring of pt's RR(low-mid 20s). Mild throat clear/cough x1-2 (as she did at Rest PRIOR TO any po's) was noted b/t trials in general but No immediate, overt clinical s/s of aspiration were noted w/ trials given. Pt's respiratory effort appeared to increase slightly w/ the effort from the exertion of the tasks but calmed again to her baseline w/ the REST BREAKS b/t trials. Swallowing of liquids was timely. No overt coughing nor decline in O2 sats noted during session. Oral phase appeared [facility] for trial consistencies given- timely bolus management and oral clearing noted. No solid foods assessed d/t Pulmonary status and need for exertion for such tasks(mastication).  OM exam revealed no unilateral weakness. Pt helped to feed self w/ setup.   Recommend a more Pureed diet w/ moistened foods to reduce exertion w/ oral intake; thin liquids Via Cup w/ aspiration precautions. This diet can also help support conservation of energy. Pills given  WHOLE vs CRUSHED in Puree to lessen risk for aspiration. STRICT aspiration precautions including frequent REST BREAKS during oral intake w/ close monitoring of RR and increased clavicular/mouth breathing, small bites/sips Slowly, NO STRAWS. GERD/REFLUX precautions.   ST services will monitor pt's status for readiness to upgrade diet/food consistency -- improved Pulmonary status necessary. Can be available for further education w/ Family/pt. A Palliative Care consult is recommended for GOC; Dietician f/u. MD/NSG updated, agreed. Precautions posted in room, chart.  SLP Visit Diagnosis: Dysphagia, oropharyngeal phase (R13.12) (in setting of Declined Pulmohary status  Baseline/currently- COPD; deconditioned; support at meals)    Aspiration Risk  Mild aspiration risk;Risk for inadequate nutrition/hydration    Diet Recommendation   Thin;Dysphagia 1 (puree) (gravies) = a more Pureed diet w/ moistened foods to reduce exertion w/ oral intake; thin liquids Via Cup w/ aspiration precautions. This diet can also help support conservation of energy. STRICT aspiration precautions including frequent REST BREAKS during oral intake w/ close monitoring of RR and increased clavicular/mouth breathing, small bites/sips Slowly, NO STRAWS. GERD/REFLUX precautions.   Medication Administration: Whole meds with puree (vs need to Crush in puree)    Other Recommendations Recommended Consults:  (Dietician; Palliative Care) Oral Care Recommendations: Oral care BID;Oral care before and after PO;Staff/trained caregiver to provide oral care     Swallow Evaluation Recommendations  See above   Assistance Recommended at Discharge  FULL  Functional  Status Assessment Patient has had a recent decline in their functional status and/or demonstrates limited ability to make significant improvements in function in a reasonable and predictable amount of time  Frequency and Duration min 2x/week  2 weeks       Prognosis Prognosis for  improved oropharyngeal function: Fair Barriers to Reach Goals: Time post onset;Severity of deficits Barriers/Prognosis Comment: in setting of Declined Pulmohary status Baseline/currently- COPD; deconditioned; support at meals      Swallow Study   General Date of Onset: 11/06/24 HPI: Pt is a 68 y.o. female with medical history significant of COPD Gold stage IV, chronic hypoxic and hypercapnic respiratory failure on as needed oxygen  and nocturnal BiPAP, GERD, HTN, chronic HFpEF, pulmonary hypertension, HLD, presented with worsening of cough wheezing shortness of breath.  Symptoms started about 7 days ago when patient started to have a dry cough wheezing and increasing exertional dyspnea.  Denies any fever chills no chest pains.  Was started on additional albuterol  inhaler on Monday however patient did not tolerate albuterol  inhaler as she felt  palpitations and chest tightness as a result her breathing symptoms has progressively getting worse over the last 3 days.  No fever or chills.  intial CXR: Negative for acute infiltrates.  Patient developed worsening respiratory distress with increased work of breathing and failure to maintain adequate ventilation despite noninvasive support so decision was made to intubate on 12/13; extubated on 12/15.  Currrently on HFNC O2 support. Type of Study: Bedside Swallow Evaluation Previous Swallow Assessment: none Diet Prior to this Study: NPO Temperature Spikes Noted: No (wbc 13.3) Respiratory Status: Increased respiratory rate;Nasal cannula (HFNC 40L; FiO2 35%) History of Recent Intubation: Yes Total duration of intubation (days): 2 days Date extubated: 11/08/24 Behavior/Cognition: Alert;Cooperative;Pleasant mood;Confused;Distractible;Requires cueing Oral Cavity Assessment: Dry Oral Care Completed by SLP: Yes Oral Cavity - Dentition: Adequate natural dentition Vision: Functional for self-feeding Self-Feeding Abilities: Able to feed self;Needs assist;Needs  set up;Total assist (able to hold cup to drink) Patient Positioning: Upright in bed (full assist) Baseline Vocal Quality: Low vocal intensity;Breathy (poor breath support for speech; choppy) Volitional Cough: Strong;Congested Volitional Swallow: Able to elicit    Oral/Motor/Sensory Function Overall Oral Motor/Sensory Function: Within functional limits   Ice Chips Ice chips: Within functional limits Presentation: Spoon (fed; 2 trials)   Thin Liquid Thin Liquid: Within functional limits Presentation: Cup;Self Fed (~4-5 ozs total) Other Comments: water , juice    Nectar Thick Nectar Thick Liquid: Not tested   Honey Thick Honey Thick Liquid: Not tested   Puree Puree: Within functional limits Presentation: Spoon;Self Fed (supported; 12+ trials) Other Comments: rest breaks   Solid     Solid: Not tested        Comer Portugal, MS, CCC-SLP Speech Language Pathologist Rehab Services; [facility] [place] [phone] (ascom) Vesna Kable 11/09/2024,5:33 PM

## 2024-11-09 NOTE — Progress Notes (Signed)
 Urine looks brown with sediment this morning. Notifed night coverage([place]).

## 2024-11-09 NOTE — IPAL (Signed)
°  Interdisciplinary Goals of Care Family Meeting   Date carried out: 11/09/2024  Location of the meeting: Bedside  Member's involved: Physician, Bedside Registered Nurse, and Family Member or next of kin    GOALS OF CARE DISCUSSION  The Clinical status was relayed to family in detail-  Updated and notified of patients medical condition- Patient remains unresponsive and will not open eyes to command.   Patient with increased WOB and using accessory muscles to breathe Explained to family course of therapy and the modalities  Patient with Progressive multiorgan failure with a very high probablity of a very minimal chance of meaningful recovery despite all aggressive and optimal medical therapy.    Husband has consented and agreed to DNR status-(no chest compressions) but allow for intubation.  PROGNOSIS IS VERY POOR  Family are satisfied with Plan of action and management. All questions answered  Additional CC time 35 mins   Rachael Blankenship Alm Cellar, M.D.  [facility]  Medical Director [facility] Medical Director [facility] Cardio-Pulmonary Department

## 2024-11-10 LAB — CBC
HCT: 30.9 % — ABNORMAL LOW (ref 36.0–46.0)
Hemoglobin: 9.5 g/dL — ABNORMAL LOW (ref 12.0–15.0)
MCH: 27.7 pg (ref 26.0–34.0)
MCHC: 30.7 g/dL (ref 30.0–36.0)
MCV: 90.1 fL (ref 80.0–100.0)
Platelets: 317 K/uL (ref 150–400)
RBC: 3.43 MIL/uL — ABNORMAL LOW (ref 3.87–5.11)
RDW: 15.2 % (ref 11.5–15.5)
WBC: 10.9 K/uL — ABNORMAL HIGH (ref 4.0–10.5)
nRBC: 0 % (ref 0.0–0.2)

## 2024-11-10 LAB — BASIC METABOLIC PANEL WITH GFR
Anion gap: 7 (ref 5–15)
BUN: 52 mg/dL — ABNORMAL HIGH (ref 8–23)
CO2: 29 mmol/L (ref 22–32)
Calcium: 9.4 mg/dL (ref 8.9–10.3)
Chloride: 113 mmol/L — ABNORMAL HIGH (ref 98–111)
Creatinine, Ser: 0.57 mg/dL (ref 0.44–1.00)
GFR, Estimated: >60 mL/min (ref >60)
Glucose, Bld: 142 mg/dL — ABNORMAL HIGH (ref 70–99)
Potassium: 3.8 mmol/L (ref 3.5–5.1)
Sodium: 149 mmol/L — ABNORMAL HIGH (ref 135–145)

## 2024-11-10 LAB — GLUCOSE, CAPILLARY
Glucose-Capillary: 113 mg/dL — ABNORMAL HIGH (ref 70–99)
Glucose-Capillary: 106 mg/dL — ABNORMAL HIGH (ref 70–99)
Glucose-Capillary: 119 mg/dL — ABNORMAL HIGH (ref 70–99)
Glucose-Capillary: 114 mg/dL — ABNORMAL HIGH (ref 70–99)
Glucose-Capillary: 129 mg/dL — ABNORMAL HIGH (ref 70–99)

## 2024-11-10 LAB — PHOSPHORUS: Phosphorus: 3.2 mg/dL (ref 2.5–4.6)

## 2024-11-10 LAB — MAGNESIUM: Magnesium: 2.3 mg/dL (ref 1.7–2.4)

## 2024-11-10 MED ORDER — DOCUSATE SODIUM 100 MG PO CAPS: 100.0000 mg | ORAL_CAPSULE | Freq: Two times a day (BID) | ORAL | Status: DC

## 2024-11-10 MED ORDER — MORPHINE SULFATE (PF) 2 MG/ML IV SOLN
INTRAVENOUS | Status: AC
  Filled 2024-11-10: qty 1

## 2024-11-10 MED ORDER — MORPHINE SULFATE (PF) 2 MG/ML IV SOLN
2.0000 mg | Freq: Once | INTRAVENOUS | Status: AC
  Administered 2024-11-10: 12:00:00 2 mg via INTRAVENOUS

## 2024-11-10 MED ORDER — POLYETHYLENE GLYCOL 3350 17 G PO PACK: 17.0000 g | PACK | Freq: Two times a day (BID) | ORAL | Status: DC

## 2024-11-10 MED ORDER — BISACODYL 10 MG RE SUPP: 10.0000 mg | Freq: Every day | RECTAL | Status: DC | PRN

## 2024-11-10 NOTE — Progress Notes (Deleted)
 Hypoglycemic Event  CBG: 35  Treatment: 8 oz juice/soda  Symptoms: None  Follow-up CBG: Time:08:39 CBG Result:89  Possible Reasons for Event: Medication regimen:    Comments/MD notified:Dr. Ginnie aware    Rachael Blankenship

## 2024-11-10 NOTE — Progress Notes (Signed)
 PHARMACY CONSULT NOTE - FOLLOW UP  Pharmacy Consult for Electrolyte Monitoring and Replacement   Recent Labs: Potassium (mmol/L)  Date Value  11/10/2024 3.8   Magnesium  (mg/dL)  Date Value  87/82/7974 2.3   Calcium (mg/dL)  Date Value  87/82/7974 9.4   Albumin (g/dL)  Date Value  87/84/7974 3.3 (L)  11/21/2023 4.4   Phosphorus (mg/dL)  Date Value  87/82/7974 3.2   Sodium (mmol/L)  Date Value  11/10/2024 149 (H)  11/21/2023 141    Assessment: 68 year old female with a history of smoking and COPD with chronic hypoxic and hypercapnic respiratory failure who presents to the hospital with increased shortness of breath, cough, and wheezing. Pharmacy is asked to follow and replace electrolytes while in CCU.   Goal of Therapy:  Electrolytes WNL  Plan:  ---no electrolyte replacement warranted for today ---recheck electrolytes in am  Adriana JONETTA Bolster ,PharmD Clinical Pharmacist 11/10/2024 7:07 AM

## 2024-11-10 NOTE — Plan of Care (Signed)
°  Problem: Clinical Measurements: Goal: Ability to maintain clinical measurements within normal limits will improve Outcome: Progressing Goal: Respiratory complications will improve Outcome: Progressing Goal: Cardiovascular complication will be avoided Outcome: Progressing   Problem: Nutrition: Goal: Adequate nutrition will be maintained Outcome: Progressing   Problem: Pain Managment: Goal: General experience of comfort will improve and/or be controlled Outcome: Progressing   Problem: Safety: Goal: Ability to remain free from injury will improve Outcome: Progressing   Problem: Skin Integrity: Goal: Risk for impaired skin integrity will decrease Outcome: Progressing   Problem: Coping: Goal: Level of anxiety will decrease Outcome: Not Progressing   Problem: Elimination: Goal: Will not experience complications related to bowel motility Outcome: Not Progressing

## 2024-11-10 NOTE — Evaluation (Addendum)
 Occupational Therapy Evaluation Patient Details Name: Rachael Blankenship MRN: [id number] DOB: [birth year] Today's Date: 11/10/2024   History of Present Illness   68 y/o female presented to ED on 11/04/24 for worsening SOB, cough, and wheezing. Admitted for acute on chronic hypoxic and hypercapnic respiratory failure and COPD exacerbation. Intubated 12/12-12/15. PMH: COPD gold stage IV, chronic hypoxic and hypercapnic respiratory failure on as needed O2 and nocturnal BiPAP, HTN, chronic HFpEF, pulmonary HTN     Clinical Impressions Chart reviewed to date, pt greeted semi supine in bed, oriented to self, place, grossly to situation, agreeable to OT evaluation. PTA per family pt amb with no AD, generally MOD I for ADL, assist for IADL. Pt presents with deficits in strength, endurance, activity tolerance, balance, cognition, affecting safe and optimal ADL completion. Pt requires MIN A for rolling in bed for toileting on bedpan, TOTAL A for peri care. Pt placed in chair position with spo2 >89% on 40 HHFNC FiO2 33%, HR to 120s bpm post toileting. RR 20-30s throughout. Nurse in room post session to address. OT will follow to facilitate optimal ADL/functional mobility performance.     If plan is discharge home, recommend the following:   A lot of help with walking and/or transfers;A lot of help with bathing/dressing/bathroom     Functional Status Assessment   Patient has had a recent decline in their functional status and demonstrates the ability to make significant improvements in function in a reasonable and predictable amount of time.     Equipment Recommendations   BSC/3in1     Recommendations for Other Services         Precautions/Restrictions   Precautions Precautions: Fall Recall of Precautions/Restrictions: Intact Restrictions Weight Bearing Restrictions Per Provider Order: No     Mobility Bed Mobility Overal bed mobility: Needs Assistance Bed Mobility:  Rolling Rolling: Min assist         General bed mobility comments: placed in chair position with spo2 >89% on 40 HHFNC FiO2 33%    Transfers                   General transfer comment: deferred      Balance Overall balance assessment: Needs assistance                                         ADL either performed or assessed with clinical judgement   ADL Overall ADL's : Needs assistance/impaired             Lower Body Bathing: Maximal assistance;Bed level       Lower Body Dressing: Maximal assistance;Bed level       Toileting- Clothing Manipulation and Hygiene: total  assistance;Bed level Toileting - Clothing Manipulation Details (indicate cue type and reason): continent BM on bedpan             Vision Patient Visual Report: No change from baseline       Perception         Praxis         Pertinent Vitals/Pain Pain Assessment Pain Assessment: CPOT Facial Expression: Relaxed, neutral Body Movements: Absence of movements Muscle Tension: Relaxed Compliance with ventilator (intubated pts.): N/A Vocalization (extubated pts.): Talking in normal tone or no sound CPOT Total: 0 Pain Intervention(s): Monitored during session     Extremity/Trunk Assessment Upper Extremity Assessment Upper Extremity Assessment: Generalized weakness   Lower Extremity Assessment Lower  Extremity Assessment: Defer to PT evaluation       Communication Communication Communication: No apparent difficulties   Cognition Arousal: Alert Behavior During Therapy: WFL for tasks assessed/performed, Anxious Cognition: Cognition impaired   Orientation impairments: Time     Attention impairment (select first level of impairment): Sustained attention Executive functioning impairment (select all impairments): Problem solving                   Following commands: Intact       Cueing  General Comments   Cueing Techniques: Verbal cues       Exercises Other Exercises Other Exercises: edu re role of OT, role of rehab   Shoulder Instructions      Home Living Family/patient expects to be discharged to:: Private residence Living Arrangements: Spouse/significant other Available Help at Discharge: Family Type of Home: House Home Access: Stairs to enter Secretary/administrator of Steps: 5 Entrance Stairs-Rails: Left;Right Home Layout: Two level;Laundry or work area in basement;Able to live on main level with bedroom/bathroom     Bathroom Shower/Tub: Producer, Television/film/video: Handicapped height     Home Equipment: [place] - single point;Shower seat          Prior Functioning/Environment Prior Level of Function : Independent/Modified Independent             Mobility Comments: amb with no AD ADLs Comments: MOD I-I with ADL, assist with IADLs per chart    OT Problem List: Decreased strength;Decreased activity tolerance;Impaired balance (sitting and/or standing);Decreased knowledge of use of DME or AE;Cardiopulmonary status limiting activity   OT Treatment/Interventions: Self-care/ADL training;Therapeutic exercise;Energy conservation;DME and/or AE instruction;Balance training;Patient/family education;Cognitive remediation/compensation;Therapeutic activities      OT Goals(Current goals can be found in the care plan section)   Acute Rehab OT Goals Patient Stated Goal: rest OT Goal Formulation: With patient Time For Goal Achievement: 11/24/24 Potential to Achieve Goals: Good ADL Goals Pt Will Perform Grooming: with modified independence;sitting Pt Will Perform Lower Body Dressing: with modified independence;sit to/from stand;sitting/lateral leans Pt Will Transfer to Toilet: with modified independence;stand pivot transfer Pt Will Perform Toileting - Clothing Manipulation and hygiene: with modified independence;sitting/lateral leans;sit to/from stand   OT Frequency:  Min 2X/week    Co-evaluation  PT/OT/SLP Co-Evaluation/Treatment: Yes Reason for Co-Treatment: Complexity of the patient's impairments (multi-system involvement);For patient/therapist safety   OT goals addressed during session: ADL's and self-care      AM-PAC OT 6 Clicks Daily Activity     Outcome Measure Help from another person eating meals?: A Little Help from another person taking care of personal grooming?: A Little Help from another person toileting, which includes using toliet, bedpan, or urinal?: A Lot Help from another person bathing (including washing, rinsing, drying)?: A Lot Help from another person to put on and taking off regular upper body clothing?: A Little Help from another person to put on and taking off regular lower body clothing?: A Lot 6 Click Score: 15   End of Session Equipment Utilized During Treatment: Oxygen  Nurse Communication: Mobility status  Activity Tolerance: Treatment limited secondary to medical complications (Comment) Patient left: in bed;with call bell/phone within reach;with bed alarm set;with family/visitor present;with nursing/sitter in room  OT Visit Diagnosis: Other abnormalities of gait and mobility (R26.89);Muscle weakness (generalized) (M62.81)                Time: 1123-1150 OT Time Calculation (min): 27 min Charges:  OT General Charges $OT Visit: 1 Visit OT Evaluation $OT Eval High  Complexity: 1 High  Therisa Sheffield, OTD OTR/L  11/10/2024, 1:23 PM

## 2024-11-10 NOTE — Progress Notes (Signed)
 Patient asked to take BiPAP off and for water . BiPAP taken off and patient placed back on heated high flow and water  given. Husband at bedside.

## 2024-11-10 NOTE — Evaluation (Addendum)
 Physical Therapy Evaluation Patient Details Name: Rachael Blankenship MRN: [id number] DOB: 1956-05-15 Today's Date: 11/10/2024  History of Present Illness  68 y/o female presented to ED on 11/04/24 for worsening SOB, cough, and wheezing. Admitted for acute on chronic hypoxic and hypercapnic respiratory failure and COPD exacerbation. Intubated 12/12-12/15. PMH: COPD gold stage IV, chronic hypoxic and hypercapnic respiratory failure on as needed O2 and nocturnal BiPAP, HTN, chronic HFpEF, pulmonary HTN  Clinical Impression  Patient admitted with the above. PTA, patient lives with husband and was modI for mobility with no AD and ADL management. Oriented to self, place, and grossly situation. Required minA for rolling in bed for bed pan placement and removal. Placed in chair position with spO2 >89% on 40L HHFNC FiO2 33%. HR up to 120s after toileting with RR 20-30s throughout. Patient anxious at end of session due to difficulty breathing. RN present at end of session. Patient will benefit from skilled PT services during acute stay to address listed deficits. Patient will benefit from ongoing therapy at discharge to maximize functional independence and safety.         If plan is discharge home, recommend the following: A lot of help with walking and/or transfers;A lot of help with bathing/dressing/bathroom;Assistance with cooking/housework;Assist for transportation;Help with stairs or ramp for entrance   Can travel by private vehicle        Equipment Recommendations Rolling Sheray Grist (2 wheels);BSC/3in1  Recommendations for Other Services       Functional Status Assessment Patient has had a recent decline in their functional status and demonstrates the ability to make significant improvements in function in a reasonable and predictable amount of time.     Precautions / Restrictions Precautions Precautions: Fall Recall of Precautions/Restrictions: Intact Restrictions Weight Bearing Restrictions Per  Provider Order: No      Mobility  Bed Mobility Overal bed mobility: Needs Assistance Bed Mobility: Rolling Rolling: Min assist         General bed mobility comments: placed in chair position with spO2 >89% on 40L HHFNC FiO2 33%. Requesting to utilize bedpan for BM. required minA for rolling towards R for placing on bedpan and for pericare    Transfers                   General transfer comment: deferred    Ambulation/Gait                  Stairs            Wheelchair Mobility     Tilt Bed    Modified Rankin (Stroke Patients Only)       Balance                                             Pertinent Vitals/Pain Pain Assessment Pain Assessment: PAINAD Breathing: noisy labored breathing, long periods of hyperventilation, Cheyne-Stokes respirations Negative Vocalization: none Facial Expression: sad, frightened, frown Body Language: tense, distressed pacing, fidgeting Consolability: distracted or reassured by voice/touch PAINAD Score: 5 Pain Intervention(s): Limited activity within patient's tolerance, Monitored during session, Repositioned    Home Living Family/patient expects to be discharged to:: Private residence Living Arrangements: Spouse/significant other Available Help at Discharge: Family Type of Home: House Home Access: Stairs to enter Entrance Stairs-Rails: Lawyer of Steps: 5   Home Layout: Two level;Laundry or work area in basement;Able to live  on main level with bedroom/bathroom Home Equipment: Cane - single point;Shower seat      Prior Function Prior Level of Function : Independent/Modified Independent             Mobility Comments: amb with no AD ADLs Comments: MOD I-I with ADL, assist with IADLs per chart     Extremity/Trunk Assessment   Upper Extremity Assessment Upper Extremity Assessment: Generalized weakness    Lower Extremity Assessment Lower Extremity  Assessment: Defer to PT evaluation       Communication   Communication Communication: No apparent difficulties    Cognition Arousal: Alert Behavior During Therapy: WFL for tasks assessed/performed   PT - Cognitive impairments: No apparent impairments                         Following commands: Intact       Cueing       General Comments      Exercises     Assessment/Plan    PT Assessment Patient needs continued PT services  PT Problem List Decreased strength;Decreased activity tolerance;Decreased mobility;Decreased balance;Decreased safety awareness;Decreased knowledge of precautions;Cardiopulmonary status limiting activity       PT Treatment Interventions DME instruction;Gait training;Functional mobility training;Therapeutic activities;Therapeutic exercise;Balance training;Neuromuscular re-education;Patient/family education;Stair training    PT Goals (Current goals can be found in the Care Plan section)  Acute Rehab PT Goals Patient Stated Goal: to breathe better PT Goal Formulation: With patient Time For Goal Achievement: 11/24/24 Potential to Achieve Goals: Fair    Frequency Min 2X/week     Co-evaluation   Reason for Co-Treatment: Complexity of the patient's impairments (multi-system involvement);For patient/therapist safety   OT goals addressed during session: ADL's and self-care       AM-PAC PT 6 Clicks Mobility  Outcome Measure Help needed turning from your back to your side while in a flat bed without using bedrails?: A Little Help needed moving from lying on your back to sitting on the side of a flat bed without using bedrails?: A Little Help needed moving to and from a bed to a chair (including a wheelchair)?: Total Help needed standing up from a chair using your arms (e.g., wheelchair or bedside chair)?: Total Help needed to walk in hospital room?: Total Help needed climbing 3-5 steps with a railing? : Total 6 Click Score: 10     End of Session Equipment Utilized During Treatment: Oxygen  Activity Tolerance: Patient limited by fatigue Patient left: in bed;with call bell/phone within reach;with bed alarm set;with family/visitor present;with nursing/sitter in room Nurse Communication: Mobility status PT Visit Diagnosis: Unsteadiness on feet (R26.81);Muscle weakness (generalized) (M62.81);Other abnormalities of gait and mobility (R26.89)    Time: 8876-8850 PT Time Calculation (min) (ACUTE ONLY): 26 min   Charges:   PT Evaluation $PT Eval Moderate Complexity: 1 Mod   PT General Charges $$ ACUTE PT VISIT: 1 Visit         Maryanne Finder, PT, DPT Physical Therapist - [facility]  [facility]   Kwame Ryland A Jyra Lagares 11/10/2024, 1:28 PM

## 2024-11-10 NOTE — Progress Notes (Signed)
 SLP Cancellation Note  Patient Details Name: Rachael Blankenship MRN: [id number] DOB: [birth year]   Cancelled treatment:       Reason Eval/Treat Not Completed: Medical issues which prohibited therapy (reviewed chart, consulted NSG and met w/ Husband in hallway)   D/t decline in pt's respiratory presentation and her anxiety, pt required increased O2 support and medication for anxiety/air hunger to calm. Will HOLD on dysphagia tx session; her lunch meal/oral intake d/t unsafe to present po's at this time secondary to increased risk for aspiration.   ST services will f/u tomorrow. Would recommend a Palliative Care consult for overall GOC discussion post Husband's request to have her at home- this was relayed to the MD. Discussed w/ Husband pt's presentation, risk for aspiration if taking oral intake w/ WOB/SOB, and offering oral care for hygiene, comfort, and stimulation of swallowing instead. Discussed her challenges of her Pulmonary status on her ability to safely eat/drink and the need to strictly monitor her cues when eating/drinking. Husband agreed.  NSG updated.       Comer Portugal, MS, CCC-SLP Speech Language Pathologist Rehab Services; [facility] [phone] (ascom) Lisaann Atha 11/10/2024, 12:48 PM

## 2024-11-10 NOTE — Progress Notes (Signed)
 Patient worked with PT and after therapy became very anxious and air hungry.  RR 33=34 bpm, heart rate 120's, patient asking for fan to be very close to face and she verbalized that she could not breath.  Patient placed back on BiPAP and precedex  drip increased with no improvement. Inge Lecher, NP notified and one time order for morphine  2mg  given. Nurse will administer and reassess.

## 2024-11-10 NOTE — Progress Notes (Signed)
 NAME:  Rachael Blankenship, MRN:  [id number], DOB:  Sep 01, 1956, LOS: 6 ADMISSION DATE:  11/04/2024, CONSULTATION DATE:  11/05/2024 CHIEF COMPLAINT:  Respiratory Failure    Brief Pt Description / Synopsis:  68 y.o female admitted with Acute on Chronic Hypoxic and Hypercapnic Respiratory Failure due to severe Acute COPD Exacerbation, failed trial of BiPAP requiring intubation and mechanical ventilation.   History of Present Illness:  Patient is a 68 year old female with a history of smoking and COPD with chronic hypoxic and hypercapnic respiratory failure who presents to the hospital with increased shortness of breath, cough, and wheezing consistent with a COPD exacerbation.   Patient reports symptom onset around 7 days ago with worsening shortness of breath, cough, wheezing, and increased exertional dyspnea.  She reports having increased oxygen  requirements and inability to walk or move short distances without having significant desaturation.  This further worsened over this week and she was unable to wait until her scheduled appointment with her primary pulmonologist prompting her to present to the ED.   EMS was activated and she was brought in with hypoxia and increased work of breathing.  She received IV steroids and was started on BiPAP for respiratory distress and admitted to the medicine service for further workup and management.  Patient started on IV Solu-Medrol , doxycycline , and received a dose of acetazolamide .  Given lack of improvement today, pulmonary and critical care medicine were consulted for increased risk of respiratory failure.   Medical chart reviewed, patient had a history of smoking and she quit 1 year ago.  Patient has been followed by outpatient pulmonary for severe COPD with her last FEV1 at 0.61 L (29% predicted).  She has chronic hypoxia for which she was started on supplemental oxygen .   Patient was admitted to the hospital in January 2025 with acute on chronic hypoxic  respiratory failure with ICU admission.  She was continued on nocturnal BiPAP after send admission and switched to triple therapy with Breztri  and Ensifentrine  was considered.  She was started on Dupixent to reduce frequency of COPD exacerbations early in 2025.  She was further referred to cardiology for consideration of a right heart catheterization for workup of pulmonary hypertension.   Right heart cath 04/21/2024 showed RA 10/6, PA 40/12 (24), and PCWP of 20 not indicative of pulmonary hypertension.   CT chest ruled out PE in October 2025 after an exacerbation.  This was notable for unchanged complete collapse of the right middle lobe with atelectasis in the lingula.  She then underwent bronchoscopy with BAL of the right middle lobe with cultures returning negative.   Patient then admitted in November 2025 with COPD exacerbation treated with antibiotics and discharged on levofloxacin  as well as a prednisone  taper.  Patient seen by her pulmonologist in early November where her prednisone  taper was extended (taper by 5 mg every 5 days).  She was also started on thrice weekly azithromycin .  TRH asked to admit for further workup and treatment.  Please see Significant Hospital Events section below for full detailed hospital course.   Pertinent  Medical History   Past Medical History:  Diagnosis Date   Bruises easily    Chronic respiratory failure with hypoxia (HCC)    Cigarette smoker    Collapse of right lung    COPD (chronic obstructive pulmonary disease) (HCC)    COVID-19 07/2024   Dependence on supplemental oxygen     Depression with anxiety    Endometriosis    GERD (gastroesophageal reflux disease)  Grade I diastolic dysfunction    History of sepsis    Hyperlipidemia    Hypertension 06/02/2012   Kidney stones    Pulmonary HTN (HCC)    Reactive airway disease 03/08/2014   Vitamin D  deficiency     Micro Data:  12/11: COVID/FLU/RSV PCR>> negative 12/11; Blood cultures x2>>  no growth  12/12: RVP>> negative  12/12: Strep pneumo urinary antigen>> negative  12/12: Legionella urinary antigen>> negative 12/12: MRSA PCR>>negative  12/13: Tracheal aspirate>> rare candida albicans  Antimicrobials:   Anti-infectives (From admission, onward)    Start     Dose/Rate Route Frequency Ordered Stop   11/07/24 1400  piperacillin -tazobactam (ZOSYN ) IVPB 3.375 g        3.375 g 12.5 mL/hr over 240 Minutes Intravenous Every 8 hours 11/07/24 1026 11/11/24 2359   11/05/24 1430  ceFEPIme  (MAXIPIME ) 2 g in sodium chloride  0.9 % 100 mL IVPB  Status:  Discontinued        2 g 200 mL/hr over 30 Minutes Intravenous Every 12 hours 11/05/24 1322 11/07/24 1009   11/04/24 2315  doxycycline  (VIBRAMYCIN ) 100 mg in sodium chloride  0.9 % 250 mL IVPB  Status:  Discontinued        100 mg 125 mL/hr over 120 Minutes Intravenous 2 times daily 11/04/24 2300 11/09/24 1056   11/04/24 1445  doxycycline  (VIBRA -TABS) tablet 100 mg  Status:  Discontinued        100 mg Oral Every 12 hours 11/04/24 1432 11/04/24 2304   11/04/24 1130  cefTRIAXone  (ROCEPHIN ) 2 g in sodium chloride  0.9 % 100 mL IVPB        2 g 200 mL/hr over 30 Minutes Intravenous Once 11/04/24 1121 11/04/24 1210   11/04/24 1130  azithromycin  (ZITHROMAX ) 500 mg in sodium chloride  0.9 % 250 mL IVPB        500 mg 250 mL/hr over 60 Minutes Intravenous  Once 11/04/24 1121 11/04/24 1314       Significant Hospital Events: Including procedures, antibiotic start and stop dates in addition to other pertinent events   11/04/24: Admitted with COPD exacerbation, on BiPAP 11/05/24: Consult to critical care for respiratory failure and risk of decompensation and need for intubation and mechanical ventilation. Intubated at night 11/06/24: On dexmedetomidine  and fentanyl  for analgesia and sedation. Synchronous with the vent. Family updated at bedside at length 11/07/24: WUA this AM, agitated on dexmedetomidine  (prop/fentanyl  dc'd). Attempt SBT but  failed due to sever anxiety 11/08/24; No significant events overnight. Afebrile, hemodynamically stable, off Levophed .  On minimal vent support, plan for WUA and SBT utilizing Precedex .  EXTUBATED to HFNC, respiratory status remains tenuous. 11/09/24: Extubated yesterday, Patient on HHFNC 30% 45 l/m, Patient states breathing is fine. Some somewhat breathless when trying to speak. Rr upper 20s. Per nurse patient gets anxious and pulls at things. On 1 precedex . Code status changed to DNR (ok with intubation).  Passed swallow evaluation. 11/10/24: No acute events overnight.  Afebrile, not requiring Levophed .  Tolerated BiPaP at bedtime, remains on Precedex  (weaning down), respiratory status slowly improving, remains on HHFNC.  Interim History / Subjective:  As outlined above under Significant Hospital Events section  Objective   Blood pressure 114/63, pulse 63, temperature 98.4 F (36.9 C), temperature source Axillary, resp. rate (!) 23, height 5' 3 (1.6 m), weight 62 kg, SpO2 100%.    FiO2 (%):  [35 %] 35 % PEEP:  [5 cmH20] 5 cmH20 Pressure Support:  [6 cmH20] 6 cmH20   Intake/Output Summary (Last 24  hours) at 11/10/2024 9276 Last data filed at 11/10/2024 9356 Gross per 24 hour  Intake 653.45 ml  Output 1320 ml  Net -666.55 ml   Filed Weights   11/08/24 0500 11/09/24 0429 11/10/24 0500  Weight: 64 kg 61.3 kg 62 kg    Examination: General: Acute on chronically ill appearing female, laying in bed, on HHFNC, on Precedex , in NAD HENT: Atraumatic, normocephalic, neck supple, no JVD Lungs: Diminished breath sounds throughout with mild rales to right, even, family reports she is at her baseline effort Cardiovascular: Tachycardia, regular rhythm, s1s2, no M/R/G Abdomen: Soft, nontender, nondistended, no guarding or rebound tenderness, BS+ x4 Extremities: Normal bulk and tone, no deformities, no edema, good peripheral perfusion Neuro: Awake on Precedex , alert, following commands, no focal  deficits noted, PERRL GU: Foley catheter in place draining yellow urine   Resolved Hospital Problem list     Assessment & Plan:   #Acute on Chronic Hypoxic & Hypercapnic Respiratory Failure #COPD Exacerbation #Chronic Atelectasis of RML PMHx: COPD on Breztri  and Dupilumab EXTUBATED 12/15 -Supplemental O2 as needed to maintain O2 sats 88 To 92% -BiPAP at bedtime and PRN  -Follow intermittent Chest X-ray & ABG as needed -Bronchodilators & Pulmicort  nebs -IV Steroids -ABX as above -Diuresis as BP and renal function permits -Pulmonary toilet as able -Consider repeat CTa Chest to rule out PE is fails to improve (CTa Chest 09/30/24 negative for PE)  #Hypertension PMHx: HFpEF, HLD Echo 11/2023: Grade I diastolic dysfunction -Continuous cardiac monitoring -Maintain MAP >65 -Cautious IV fluids -Diuresis as BP and renal function permits  #Anemia -Monitor for S/Sx of bleeding -Trend CBC -Lovenox  for VTE Prophylaxis  -Transfuse for Hgb <7  #Acute Metabolic Encephalopathy PMHx: Anxiety/depression -Treatment of metabolic derangements as outlined above -Provide supportive care -Promote normal sleep/wake cycle and family presence -Avoid sedating medications as able -Continue Precedex  as needed -Continue Valium  -Continue Wellbutrin  and Cymbalta         Best Practice (right click and Reselect all SmartList Selections daily)   Diet/type: Regular  DVT prophylaxis: LMWH GI prophylaxis: PPI Lines: N/A Foley:  Yes, and it is still needed ~ plan to remove 12/17 Code Status:  DNR (may intubate) Last date of multidisciplinary goals of care discussion [12/17]  12/17: Will update pt's husband when he arrives at bedside on plan of care.  Labs   CBC: Recent Labs  Lab 11/04/24 1132 11/05/24 1212 11/06/24 0549 11/07/24 0515 11/08/24 0315 11/09/24 0335 11/10/24 0509  WBC 20.2*   < > 16.5* 12.3* 13.4* 13.3* 10.9*  NEUTROABS 13.3*  --   --   --   --   --   --   HGB 13.2   < >  9.9* 9.1* 8.8* 9.5* 9.5*  HCT 43.1   < > 32.2* 29.7* 29.0* 32.5* 30.9*  MCV 88.9   < > 89.7 89.2 90.9 93.1 90.1  PLT 639*   < > 442* 372 371 373 317   < > = values in this interval not displayed.    Basic Metabolic Panel: Recent Labs  Lab 11/06/24 0549 11/07/24 0515 11/08/24 0315 11/09/24 0335 11/10/24 0509  NA 141 144 144 148* 149*  K 3.5 4.6 4.5 3.9 3.8  CL 103 109 110 110 113*  CO2 27 28 28 29 29   GLUCOSE 170* 204* 161* 153* 142*  BUN 41* 47* 53* 51* 52*  CREATININE 0.66 0.59 0.58 0.68 0.57  CALCIUM 9.0 8.6* 9.0 9.3 9.4  MG  --  2.3 2.3 2.5* 2.3  PHOS 2.5 2.0* 2.6 3.5 3.2   GFR: Estimated Creatinine Clearance: 54.1 mL/min (by C-G formula based on SCr of 0.57 mg/dL). Recent Labs  Lab 11/04/24 1132 11/04/24 1350 11/05/24 1212 11/06/24 0549 11/07/24 0515 11/08/24 0315 11/09/24 0335 11/10/24 0509  PROCALCITON  --   --  0.24  --   --   --   --   --   WBC 20.2*  --  19.1*   < > 12.3* 13.4* 13.3* 10.9*  LATICACIDVEN 2.5* 1.0  --   --   --   --   --   --    < > = values in this interval not displayed.    Liver Function Tests: Recent Labs  Lab 11/04/24 1132 11/05/24 1212 11/06/24 0549 11/07/24 0515 11/08/24 0315  AST 25  --   --   --   --   ALT 51*  --   --   --   --   ALKPHOS 138*  --   --   --   --   BILITOT 0.4  --   --   --   --   PROT 8.9*  --   --   --   --   ALBUMIN 4.4 4.2 3.5 3.3* 3.3*   No results for input(s): LIPASE, AMYLASE in the last 168 hours. No results for input(s): AMMONIA in the last 168 hours.  ABG    Component Value Date/Time   PHART 7.28 (L) 11/06/2024 0045   PCO2ART 69 (HH) 11/06/2024 0045   PO2ART 98 11/06/2024 0045   HCO3 32.4 (H) 11/06/2024 0045   O2SAT 98.8 11/06/2024 0045     Coagulation Profile: No results for input(s): INR, PROTIME in the last 168 hours.  Cardiac Enzymes: No results for input(s): CKTOTAL, CKMB, CKMBINDEX, TROPONINI in the last 168 hours.  HbA1C: Hemoglobin A1C  Date/Time Value  Ref Range Status  12/11/2023 04:01 PM 6.2 (A) 4.0 - 5.6 % Final   Hgb A1c MFr Bld  Date/Time Value Ref Range Status  05/22/2018 12:02 AM 6.2 (H) 4.8 - 5.6 % Final    Comment:    (NOTE) Pre diabetes:          5.7%-6.4% Diabetes:              >6.4% Glycemic control for   <7.0% adults with diabetes     CBG: Recent Labs  Lab 11/09/24 1146 11/09/24 1601 11/09/24 1928 11/09/24 2322 11/10/24 0305  GLUCAP 87 125* 193* 93 113*    Review of Systems:   Positives in BOLD: Gen: Denies fever, chills, weight change, fatigue, night sweats, anxiety  HEENT: Denies blurred vision, double vision, hearing loss, tinnitus, sinus congestion, rhinorrhea, sore throat, neck stiffness, dysphagia PULM: Denies shortness of breath, cough, sputum production, hemoptysis, wheezing CV: Denies chest pain, edema, orthopnea, paroxysmal nocturnal dyspnea, palpitations GI: Denies abdominal pain, nausea, vomiting, diarrhea, hematochezia, melena, constipation, change in bowel habits GU: Denies dysuria, hematuria, polyuria, oliguria, urethral discharge Endocrine: Denies hot or cold intolerance, polyuria, polyphagia or appetite change Derm: Denies rash, dry skin, scaling or peeling skin change Heme: Denies easy bruising, bleeding, bleeding gums Neuro: Denies headache, numbness, weakness, slurred speech, loss of memory or consciousness   Past Medical History:  She,  has a past medical history of Bruises easily, Chronic respiratory failure with hypoxia (HCC), Cigarette smoker, Collapse of right lung, COPD (chronic obstructive pulmonary disease) (HCC), COVID-19 (07/2024), Dependence on supplemental oxygen , Depression with anxiety, Endometriosis, GERD (gastroesophageal reflux disease), Grade I  diastolic dysfunction, History of sepsis, Hyperlipidemia, Hypertension (06/02/2012), Kidney stones, Pulmonary HTN (HCC), Reactive airway disease (03/08/2014), and Vitamin D  deficiency.   Surgical History:   Past Surgical History:   Procedure Laterality Date   BRONCHIAL WASHINGS N/A 09/17/2024   Procedure: IRRIGATION, BRONCHUS;  Surgeon: Parris Manna, MD;  Location: [facility] ORS;  Service: Thoracic;  Laterality: N/A;   CATARACT EXTRACTION W/PHACO Right 12/04/2023   Procedure: CATARACT EXTRACTION PHACO AND INTRAOCULAR LENS PLACEMENT (IOC) RIGHT  CLAREON VIVITY LENS;  Surgeon: Enola [name], MD;  Location: [facility];  Service: Ophthalmology;  Laterality: Right;  10.40 1:04.0   CATARACT EXTRACTION W/PHACO Left 12/18/2023   Procedure: CATARACT EXTRACTION PHACO AND INTRAOCULAR LENS PLACEMENT (IOC) LEFT  CLAREON VIVITY TORIC 16.28, 01:19.2;  Surgeon: Enola [name], MD;  Location: [facility];  Service: Ophthalmology;  Laterality: Left;   COLONOSCOPY WITH PROPOFOL  N/A 01/02/2023   Procedure: COLONOSCOPY WITH PROPOFOL ;  Surgeon: Unk Corinn Skiff, MD;  Location: [facility] END[facility]COPY;  Service: Gastroenterology;  Laterality: N/A;   ESOPHAGOGASTRODUODENOSCOPY N/A 07/29/2018   Procedure: ESOPHAGOGASTRODUODENOSCOPY (EGD);  Surgeon: Toledo, Ladell POUR, MD;  Location: [facility] ENDOSCOPY;  Service: Gastroenterology;  Laterality: N/A;   ESOPHAGOGASTRODUODENOSCOPY (EGD) WITH PROPOFOL  N/A 01/02/2023   Procedure: ESOPHAGOGASTRODUODENOSCOPY (EGD) WITH PROPOFOL ;  Surgeon: Unk Corinn Skiff, MD;  Location: [facility] END[facility]COPY;  Service: Gastroenterology;  Laterality: N/A;   FLEXIBLE BRONCHOSCOPY N/A 09/17/2024   Procedure: BRONCHOSCOPY, FLEXIBLE;  Surgeon: Parris Manna, MD;  Location: [facility] ORS;  Service: Thoracic;  Laterality: N/A;   IR RADIOLOGIST EVAL & MGMT  10/19/2024   KIDNEY STONE SURGERY     RIGHT HEART CATH Right 04/21/2024   Procedure: RIGHT HEART CATH;  Surgeon: Ammon Blunt, MD;  Location: [facility] INVASIVE CV LAB;  Service: Cardiovascular;  Laterality: Right;   TONSILLECTOMY       Social History:   reports that she quit smoking about 10 months ago. Her smoking use included cigarettes. She started smoking  about 50 years ago. She has a 26.8 pack-year smoking history. She has never used smokeless tobacco. She reports current alcohol use. She reports that she does not use drugs.   Family History:  Her family history includes GER disease in her father and mother; Heart attack in her father; Hyperlipidemia in her mother; Hypertension in her father and mother; Parkinson's disease in her father.   Allergies Allergies[1]   Home Medications  Prior to Admission medications  Medication Sig Start Date End Date Taking? Authorizing Provider  ALPRAZolam  (XANAX ) 0.25 MG tablet Take 1 tablet (0.25 mg total) by mouth 3 (three) times daily as needed for anxiety. 12/11/23  Yes Abernathy, Mardy, NP  ascorbic acid  (VITAMIN C ) 500 MG tablet Take 500 mg by mouth daily.   Yes [provider]  aspirin  81 MG tablet Take 81 mg by mouth at bedtime.    Yes [provider]  azithromycin  (ZITHROMAX ) 250 MG tablet Take 250 mg by mouth every other day. 10/06/24 11/14/24 Yes [provider]  BREZTRI  AEROSPHERE 160-9-4.8 MCG/ACT AERO inhaler Inhale 2 puffs into the lungs 2 (two) times daily. 04/12/24  Yes [provider]  buPROPion  (WELLBUTRIN ) 75 MG tablet Take 75 mg by mouth 2 (two) times daily.   Yes [provider]  Cholecalciferol  25 MCG (1000 UT) tablet Take 1,000 Units by mouth daily.    Yes [provider]  Coenzyme Q10 (CO Q-10) 100 MG CAPS Take 100 mg by mouth daily.   Yes [provider]  DENTA 5000 PLUS 1.1 %  CREA dental cream Take 1 application  by mouth 2 (two) times daily as needed. 04/14/21  Yes [provider]  dextromethorphan-guaiFENesin  (MUCINEX  DM) 30-600 MG 12hr tablet Take 1 tablet by mouth 2 (two) times daily as needed for cough. 10/02/24  Yes Caleen Qualia, MD  DULoxetine  (CYMBALTA ) 60 MG capsule Take 1 capsule (60 mg total) by mouth daily. 05/19/24  Yes Abernathy, Alyssa, NP  Dupilumab 300 MG/2ML SOAJ Inject 300 mg into the skin every 14  (fourteen) days. 01/27/24  Yes [provider]  EPINEPHrine  0.3 mg/0.3 mL IJ SOAJ injection Inject 0.3 mg into the muscle as needed. 01/27/24  Yes [provider]  famotidine  (PEPCID ) 40 MG tablet Take 1 tablet (40 mg total) by mouth 2 (two) times daily. 04/05/24  Yes Abernathy, Mardy, NP  fluticasone  (FLONASE ) 50 MCG/ACT nasal spray Place 2 sprays into both nostrils daily.   Yes [provider]  furosemide  (LASIX ) 40 MG tablet TAKE 1 TABLET BY MOUTH DAILY 04/12/24  Yes Abernathy, Alyssa, NP  gemfibrozil  (LOPID ) 600 MG tablet Take 1 tablet (600 mg total) by mouth 2 (two) times daily before a meal. 03/29/24  Yes Abernathy, Alyssa, NP  Ipratropium-Albuterol  (COMBIVENT  RESPIMAT) 20-100 MCG/ACT AERS respimat INHALE 1 PUFF INTO THE LUNGS EVERY 6 HOURS AS NEEDED FOR WHEEZING OR SHORTNESS OF BREATH 10/31/23  Yes Khan, Fozia M, MD  Magnesium  250 MG CAPS Take 250 mg by mouth daily.   Yes [provider]  metoprolol  tartrate (LOPRESSOR ) 25 MG tablet Take 0.5 tablets (12.5 mg total) by mouth 2 (two) times daily. 10/02/24  Yes Amin, Sumayya, MD  Multiple Vitamins-Minerals (MULTIVITAMIN WITH MINERALS) tablet Take 1 tablet by mouth daily. Centrum Silver 50+   Yes [provider]  Potassium Chloride  CR (MICRO-K ) 8 MEQ CPCR capsule CR Take 1 capsule (8 mEq total) by mouth daily. 10/26/23  Yes Khan, Fozia M, MD  predniSONE  (DELTASONE ) 5 MG tablet Take by mouth. Take 9 tablets (45 mg total) by mouth once daily for 5 days, THEN 8 tablets (40 mg total) once daily for 5 days, THEN 7 tablets (35 mg total) once daily for 5 days, THEN 6 tablets (30 mg total) once daily for 5 days, THEN 5 tablets (25 mg total) once daily for 5 days, THEN 4 tablets (20 mg total) once daily for 5 days, THEN 3 tablets (15 mg total) once daily for 5 days, THEN 2 tablets (10 mg total) once daily for 5 days, THEN 1 tablet (5 mg total) once daily for 5 days, THEN 0.5 tablets (2.5 mg total) once daily for 5 days.  10/06/24 11/25/24 Yes [provider]  sucralfate  (CARAFATE ) 1 g tablet Take 1 g by mouth 4 (four) times daily as needed (Stomach).   Yes [provider]  vitamin E  180 MG (400 UNITS) capsule Take 400 Units by mouth daily.   Yes [provider]  colchicine 0.6 MG tablet Take 0.6 mg by mouth daily. Patient not taking: Reported on 11/04/2024 09/13/24   [provider]  feeding supplement (ENSURE PLUS HIGH PROTEIN) LIQD Take 237 mLs by mouth 2 (two) times daily between meals. 10/03/24   Amin, Sumayya, MD  OXYGEN  Inhale 2 L into the lungs continuous. Continuous oxygen  and NIV at night, 2 liters   pt uses [facility] for oxygen  supplies    [provider]     Critical care time: 40 minutes     Inge Lecher, AGACNP-BC [facility] Prefer epic messenger for  cross cover needs If after hours, please call E-link        [1]  Allergies Allergen Reactions   Naproxen Rash and Other (See Comments)    Spouse confirmed that pt breaks out in hives head to toe.    Sulfa Antibiotics Hives and Rash    Sulfa products.

## 2024-11-11 ENCOUNTER — Inpatient Hospital Stay

## 2024-11-11 ENCOUNTER — Encounter: Payer: Self-pay | Admitting: Internal Medicine

## 2024-11-11 ENCOUNTER — Other Ambulatory Visit

## 2024-11-11 DIAGNOSIS — I110 Hypertensive heart disease with heart failure: Secondary | ICD-10-CM

## 2024-11-11 DIAGNOSIS — Z515 Encounter for palliative care: Secondary | ICD-10-CM

## 2024-11-11 DIAGNOSIS — I5030 Unspecified diastolic (congestive) heart failure: Secondary | ICD-10-CM

## 2024-11-11 DIAGNOSIS — G9341 Metabolic encephalopathy: Secondary | ICD-10-CM

## 2024-11-11 LAB — GLUCOSE, CAPILLARY
Glucose-Capillary: 112 mg/dL — ABNORMAL HIGH (ref 70–99)
Glucose-Capillary: 133 mg/dL — ABNORMAL HIGH (ref 70–99)
Glucose-Capillary: 134 mg/dL — ABNORMAL HIGH (ref 70–99)
Glucose-Capillary: 135 mg/dL — ABNORMAL HIGH (ref 70–99)
Glucose-Capillary: 154 mg/dL — ABNORMAL HIGH (ref 70–99)
Glucose-Capillary: 209 mg/dL — ABNORMAL HIGH (ref 70–99)
Glucose-Capillary: 257 mg/dL — ABNORMAL HIGH (ref 70–99)
Glucose-Capillary: 99 mg/dL (ref 70–99)

## 2024-11-11 LAB — HEPATIC FUNCTION PANEL
ALT: 32 U/L (ref 0–44)
AST: 23 U/L (ref 15–41)
Albumin: 3.2 g/dL — ABNORMAL LOW (ref 3.5–5.0)
Alkaline Phosphatase: 87 U/L (ref 38–126)
Bilirubin, Direct: 0.1 mg/dL (ref 0.0–0.2)
Indirect Bilirubin: 0.2 mg/dL — ABNORMAL LOW (ref 0.3–0.9)
Total Bilirubin: 0.3 mg/dL (ref 0.0–1.2)
Total Protein: 5.8 g/dL — ABNORMAL LOW (ref 6.5–8.1)

## 2024-11-11 LAB — BASIC METABOLIC PANEL WITH GFR
Anion gap: 8 (ref 5–15)
BUN: 40 mg/dL — ABNORMAL HIGH (ref 8–23)
CO2: 32 mmol/L (ref 22–32)
Calcium: 9.2 mg/dL (ref 8.9–10.3)
Chloride: 106 mmol/L (ref 98–111)
Creatinine, Ser: 0.54 mg/dL (ref 0.44–1.00)
GFR, Estimated: >60 mL/min (ref >60)
Glucose, Bld: 102 mg/dL — ABNORMAL HIGH (ref 70–99)
Potassium: 3.9 mmol/L (ref 3.5–5.1)
Sodium: 146 mmol/L — ABNORMAL HIGH (ref 135–145)

## 2024-11-11 LAB — CBC
HCT: 31.8 % — ABNORMAL LOW (ref 36.0–46.0)
Hemoglobin: 9.7 g/dL — ABNORMAL LOW (ref 12.0–15.0)
MCH: 27.1 pg (ref 26.0–34.0)
MCHC: 30.5 g/dL (ref 30.0–36.0)
MCV: 88.8 fL (ref 80.0–100.0)
Platelets: 317 K/uL (ref 150–400)
RBC: 3.58 MIL/uL — ABNORMAL LOW (ref 3.87–5.11)
RDW: 15 % (ref 11.5–15.5)
WBC: 9.9 K/uL (ref 4.0–10.5)
nRBC: 0 % (ref 0.0–0.2)

## 2024-11-11 LAB — PHOSPHORUS: Phosphorus: 3.5 mg/dL (ref 2.5–4.6)

## 2024-11-11 LAB — MAGNESIUM: Magnesium: 2.1 mg/dL (ref 1.7–2.4)

## 2024-11-11 MED ORDER — DIAZEPAM 2 MG PO TABS
2.0000 mg | ORAL_TABLET | Freq: Three times a day (TID) | ORAL | Status: DC
  Administered 2024-11-11 – 2024-11-12 (×4): 2 mg via ORAL
  Filled 2024-11-11 (×4): qty 1

## 2024-11-11 MED ORDER — METHYLPREDNISOLONE SODIUM SUCC 40 MG IJ SOLR
20.0000 mg | INTRAMUSCULAR | Status: DC
  Administered 2024-11-11 – 2024-11-15 (×5): 20 mg via INTRAVENOUS
  Filled 2024-11-11 (×5): qty 1

## 2024-11-11 MED ORDER — MORPHINE SULFATE (CONCENTRATE) 10 MG /0.5 ML PO SOLN
5.0000 mg | ORAL | Status: DC | PRN
  Administered 2024-11-11 (×2): 5 mg via ORAL
  Filled 2024-11-11 (×2): qty 0.5

## 2024-11-11 MED ORDER — FUROSEMIDE 10 MG/ML IJ SOLN
20.0000 mg | Freq: Once | INTRAMUSCULAR | Status: AC
  Administered 2024-11-11: 12:00:00 20 mg via INTRAVENOUS
  Filled 2024-11-11: qty 2

## 2024-11-11 MED ORDER — ACETAMINOPHEN 325 MG PO TABS
650.0000 mg | ORAL_TABLET | Freq: Four times a day (QID) | ORAL | Status: DC | PRN
  Administered 2024-11-15: 650 mg via ORAL
  Filled 2024-11-11: qty 2

## 2024-11-11 MED ORDER — ACETAMINOPHEN 650 MG RE SUPP: 650.0000 mg | Freq: Four times a day (QID) | RECTAL | Status: DC | PRN

## 2024-11-11 MED ORDER — CLONIDINE HCL 0.1 MG PO TABS
0.1000 mg | ORAL_TABLET | Freq: Three times a day (TID) | ORAL | Status: DC
  Administered 2024-11-11 – 2024-11-15 (×13): 0.1 mg via ORAL
  Filled 2024-11-11 (×13): qty 1

## 2024-11-11 NOTE — TOC Progression Note (Signed)
 Transition of Care [facility]) - Progression Note    Patient Details  Name: Rachael Blankenship MRN: [id number] Date of Birth: 02-05-1956  [facility] CM/SW Contact  K'La JINNY Ruts, LCSW Phone Number: 11/11/2024, 3:16 PM  Clinical Narrative:    Chart reviewed. Received a consult for Home Hospice referral. SW reached out to [facility]. [place] was able to speak with the family. The family would like to discuss DME needs when the patient is getting closer to D/C.    Expected Discharge Plan: Home w Home Health Services Barriers to Discharge: Continued Medical Work up               Expected Discharge Plan and Services     Post Acute Care Choice: Home Health Living arrangements for the past 2 months: Single Family Home                                       Social Drivers of Health (SDOH) Interventions SDOH Screenings   Food Insecurity: No Food Insecurity (11/05/2024)  Housing: Low Risk (11/05/2024)  Transportation Needs: No Transportation Needs (11/05/2024)  Utilities: Not At Risk (11/05/2024)  Alcohol Screen: Low Risk (06/24/2022)  Depression (PHQ2-9): Low Risk (03/24/2023)  Financial Resource Strain: Low Risk  (10/19/2024)   Received from [facility]  Social Connections: Socially Integrated (11/05/2024)  Tobacco Use: Medium Risk (11/11/2024)    Readmission Risk Interventions    11/09/2024   11:20 AM  Readmission Risk Prevention Plan  Transportation Screening Complete  PCP or Specialist Appt within 3-5 Days Complete  Social Work Consult for Recovery Care Planning/Counseling Complete  Palliative Care Screening Not Applicable  Medication Review [employer]) Complete

## 2024-11-11 NOTE — Plan of Care (Signed)

## 2024-11-11 NOTE — Progress Notes (Addendum)
 [facility], Room ICU [facility] Liaison Note  Received request from [facility], LCSW, Transitions of Care Manager, for hospice services at home after discharge.  Spoke with patient and spouse to initiate education related to hospice philosophy, services, and team approach to care.  Patient and spouse verbalized understanding of information given.  Per discussion, the plan is for discharge home by [facility] when medically stable.    DME needs discussed.  Patient has the following equipment in the home: Home 02 (up to 6L/min) and a BIPAP (possible trilogy type of machine).    Patient/family requests the following equipment in the home: Spouse would like to discuss closer to discharge.  Unsure at the moment.    The address has been verified and is correct in the chart. Rachael Blankenship and phone number [phone] is the family contact to arrange time of equipment delivery.    Please send signed and completed DNR home with the patient/family.  Please provide prescriptions at discharge as needed to ensure ongoing symptom management.   [facility] information and contact numbers given to (Patient/family) Above information shared with [place],LCSW, Transitions of Care Manager.     Please call with any Hospice related questions or concerns.  Thank you for the opportunity to participate in this patient's care.  Marinell Nova, [facility] Liaison [phone]

## 2024-11-11 NOTE — Progress Notes (Addendum)
 NAME:  Rachael Blankenship, MRN:  [id number], DOB:  December 23, 1955, LOS: 7 ADMISSION DATE:  11/04/2024, CONSULTATION DATE:  11/05/2024 CHIEF COMPLAINT:  Respiratory Failure    Brief Pt Description / Synopsis:  68 y.o female admitted with Acute on Chronic Hypoxic and Hypercapnic Respiratory Failure due to severe Acute COPD Exacerbation, failed trial of BiPAP requiring intubation and mechanical ventilation.   History of Present Illness:  Patient is a 68 year old female with a history of smoking and COPD with chronic hypoxic and hypercapnic respiratory failure who presents to the hospital with increased shortness of breath, cough, and wheezing consistent with a COPD exacerbation.   Patient reports symptom onset around 7 days ago with worsening shortness of breath, cough, wheezing, and increased exertional dyspnea.  She reports having increased oxygen  requirements and inability to walk or move short distances without having significant desaturation.  This further worsened over this week and she was unable to wait until her scheduled appointment with her primary pulmonologist prompting her to present to the ED.   EMS was activated and she was brought in with hypoxia and increased work of breathing.  She received IV steroids and was started on BiPAP for respiratory distress and admitted to the medicine service for further workup and management.  Patient started on IV Solu-Medrol , doxycycline , and received a dose of acetazolamide .  Given lack of improvement today, pulmonary and critical care medicine were consulted for increased risk of respiratory failure.   Medical chart reviewed, patient had a history of smoking and she quit 1 year ago.  Patient has been followed by outpatient pulmonary for severe COPD with her last FEV1 at 0.61 L (29% predicted).  She has chronic hypoxia for which she was started on supplemental oxygen .   Patient was admitted to the hospital in January 2025 with acute on chronic hypoxic  respiratory failure with ICU admission.  She was continued on nocturnal BiPAP after send admission and switched to triple therapy with Breztri  and Ensifentrine  was considered.  She was started on Dupixent to reduce frequency of COPD exacerbations early in 2025.  She was further referred to cardiology for consideration of a right heart catheterization for workup of pulmonary hypertension.   Right heart cath 04/21/2024 showed RA 10/6, PA 40/12 (24), and PCWP of 20 not indicative of pulmonary hypertension.   CT chest ruled out PE in October 2025 after an exacerbation.  This was notable for unchanged complete collapse of the right middle lobe with atelectasis in the lingula.  She then underwent bronchoscopy with BAL of the right middle lobe with cultures returning negative.   Patient then admitted in November 2025 with COPD exacerbation treated with antibiotics and discharged on levofloxacin  as well as a prednisone  taper.  Patient seen by her pulmonologist in early November where her prednisone  taper was extended (taper by 5 mg every 5 days).  She was also started on thrice weekly azithromycin .  [facility] asked to admit for further workup and treatment.  Please see Significant Hospital Events section below for full detailed hospital course.   Pertinent  Medical History   Past Medical History:  Diagnosis Date   Bruises easily    Chronic respiratory failure with hypoxia (HCC)    Cigarette smoker    Collapse of right lung    COPD (chronic obstructive pulmonary disease) (HCC)    COVID-19 07/2024   Dependence on supplemental oxygen     Depression with anxiety    Endometriosis    GERD (gastroesophageal reflux disease)  Grade I diastolic dysfunction    History of sepsis    Hyperlipidemia    Hypertension 06/02/2012   Kidney stones    Pulmonary HTN (HCC)    Reactive airway disease 03/08/2014   Vitamin D  deficiency     Micro Data:  12/11: COVID/FLU/RSV PCR>> negative 12/11; Blood cultures x2>>  no growth  12/12: RVP>> negative  12/12: Strep pneumo urinary antigen>> negative  12/12: Legionella urinary antigen>> negative 12/12: MRSA PCR>>negative  12/13: Tracheal aspirate>> rare candida albicans  Antimicrobials:   Anti-infectives (From admission, onward)    Start     Dose/Rate Route Frequency Ordered Stop   11/07/24 1400  piperacillin -tazobactam (ZOSYN ) IVPB 3.375 g        3.375 g 12.5 mL/hr over 240 Minutes Intravenous Every 8 hours 11/07/24 1026 11/11/24 2359   11/05/24 1430  ceFEPIme  (MAXIPIME ) 2 g in sodium chloride  0.9 % 100 mL IVPB  Status:  Discontinued        2 g 200 mL/hr over 30 Minutes Intravenous Every 12 hours 11/05/24 1322 11/07/24 1009   11/04/24 2315  doxycycline  (VIBRAMYCIN ) 100 mg in sodium chloride  0.9 % 250 mL IVPB  Status:  Discontinued        100 mg 125 mL/hr over 120 Minutes Intravenous 2 times daily 11/04/24 2300 11/09/24 1056   11/04/24 1445  doxycycline  (VIBRA -TABS) tablet 100 mg  Status:  Discontinued        100 mg Oral Every 12 hours 11/04/24 1432 11/04/24 2304   11/04/24 1130  cefTRIAXone  (ROCEPHIN ) 2 g in sodium chloride  0.9 % 100 mL IVPB        2 g 200 mL/hr over 30 Minutes Intravenous Once 11/04/24 1121 11/04/24 1210   11/04/24 1130  azithromycin  (ZITHROMAX ) 500 mg in sodium chloride  0.9 % 250 mL IVPB        500 mg 250 mL/hr over 60 Minutes Intravenous  Once 11/04/24 1121 11/04/24 1314       Significant Hospital Events: Including procedures, antibiotic start and stop dates in addition to other pertinent events   11/04/24: Admitted with COPD exacerbation, on BiPAP 11/05/24: Consult to critical care for respiratory failure and risk of decompensation and need for intubation and mechanical ventilation. Intubated at night 11/06/24: On dexmedetomidine  and fentanyl  for analgesia and sedation. Synchronous with the vent. Family updated at bedside at length 11/07/24: WUA this AM, agitated on dexmedetomidine  (prop/fentanyl  dc'd). Attempt SBT but  failed due to sever anxiety 11/08/24; No significant events overnight. Afebrile, hemodynamically stable, off Levophed .  On minimal vent support, plan for WUA and SBT utilizing Precedex .  EXTUBATED to HFNC, respiratory status remains tenuous. 11/09/24: Extubated yesterday, Patient on HHFNC 30% 45 l/m, Patient states breathing is fine. Some somewhat breathless when trying to speak. Rr upper 20s. Per nurse patient gets anxious and pulls at things. On 1 precedex . Code status changed to DNR (ok with intubation).  Passed swallow evaluation. 11/10/24: No acute events overnight.  Afebrile, not requiring Levophed .  Tolerated BiPaP at bedtime, remains on Precedex  (weaning down), respiratory status slowly improving, remains on HHFNC. 11/11/24: No acute events overnight, tolerated BiPAP with Precedex , requesting to go back to HFNC.  Attempt to wean off Precedex  today, will start Clonidine  to assist with weaning.  Transition IV to PO Valium .  Decrease solumedrol to 20 mg daily.  Diurese with 20 mg Lasix  x1 dose. Consult Palliative Care to assist with GOC discussions.   Interim History / Subjective:  As outlined above under Significant Hospital Events section  Objective   Blood pressure (!) 146/67, pulse 99, temperature 97.8 F (36.6 C), temperature source Axillary, resp. rate (!) 26, height 5' 3 (1.6 m), weight 63.1 kg, SpO2 96%.    FiO2 (%):  [30 %-35 %] 35 % PEEP:  [5 cmH20] 5 cmH20 Pressure Support:  [6 cmH20] 6 cmH20   Intake/Output Summary (Last 24 hours) at 11/11/2024 9266 Last data filed at 11/11/2024 9376 Gross per 24 hour  Intake 456.69 ml  Output 1175 ml  Net -718.31 ml   Filed Weights   11/09/24 0429 11/10/24 0500 11/11/24 0500  Weight: 61.3 kg 62 kg 63.1 kg    Examination: General: Acute on chronically ill appearing female, laying in bed, on BiPAP, on Precedex , in NAD HENT: Atraumatic, normocephalic, neck supple, no JVD Lungs: Diminished breath sounds throughout, even, family  reports she is at her baseline effort Cardiovascular: Tachycardia, regular rhythm, s1s2, no M/R/G Abdomen: Soft, nontender, nondistended, no guarding or rebound tenderness, BS+ x4 Extremities: Normal bulk and tone, no deformities, no edema, good peripheral perfusion Neuro: Awake on Precedex , alert and mildly anxious, following commands, no focal deficits noted, PERRL GU: External catheter in place    Resolved Hospital Problem list     Assessment & Plan:   #Acute on Chronic Hypoxic & Hypercapnic Respiratory Failure #COPD Exacerbation #Chronic Atelectasis of RML PMHx: COPD on Breztri  and Dupilumab EXTUBATED 12/15 -Supplemental O2 as needed to maintain O2 sats 88 To 92% -BiPAP at bedtime and PRN  -Follow intermittent Chest X-ray & ABG as needed -Bronchodilators & Pulmicort  nebs -IV Steroids ~ decrease to 20 mg daily on 12/18 -ABX as above ~ Zosyn  course to complete on 12/18 -Diuresis as BP and renal function permits ~ diurese with 20 mg IV Lasix  x1 dose 12/18 -Pulmonary toilet as able -Consider repeat CTa Chest to rule out PE is fails to improve (CTa Chest 09/30/24 negative for PE)  #Hypertension PMHx: HFpEF, HLD Echo 11/2023: Grade I diastolic dysfunction -Continuous cardiac monitoring -Maintain MAP >65 -Cautious IV fluids -Diuresis as BP and renal function permits -Start Clonidine    #Anemia -Monitor for S/Sx of bleeding -Trend CBC -Lovenox  for VTE Prophylaxis  -Transfuse for Hgb <7  #Acute Metabolic Encephalopathy PMHx: Anxiety/depression -Treatment of metabolic derangements as outlined above -Provide supportive care -Promote normal sleep/wake cycle and family presence -Avoid sedating medications as able -Continue Precedex  as needed ~ attempt to wean off 12/18 ~ will start Clonidine  to assist with weaning  -Continue Valium  ~ change to PO on 12/18 -Continue Wellbutrin  and Cymbalta         Best Practice (right click and Reselect all SmartList Selections daily)    Diet/type: Regular  DVT prophylaxis: LMWH GI prophylaxis: PPI Lines: N/A Foley:  N/A Code Status:  DNR (may intubate) Last date of multidisciplinary goals of care discussion [12/18]  12/18: Updated pt's husband  at bedside on plan of care.  Labs   CBC: Recent Labs  Lab 11/04/24 1132 11/05/24 1212 11/07/24 0515 11/08/24 0315 11/09/24 0335 11/10/24 0509 11/11/24 0544  WBC 20.2*   < > 12.3* 13.4* 13.3* 10.9* 9.9  NEUTROABS 13.3*  --   --   --   --   --   --   HGB 13.2   < > 9.1* 8.8* 9.5* 9.5* 9.7*  HCT 43.1   < > 29.7* 29.0* 32.5* 30.9* 31.8*  MCV 88.9   < > 89.2 90.9 93.1 90.1 88.8  PLT 639*   < > 372 371 373 317 317   < > =  values in this interval not displayed.    Basic Metabolic Panel: Recent Labs  Lab 11/07/24 0515 11/08/24 0315 11/09/24 0335 11/10/24 0509 11/11/24 0544  NA 144 144 148* 149* 146*  K 4.6 4.5 3.9 3.8 3.9  CL 109 110 110 113* 106  CO2 28 28 29 29  32  GLUCOSE 204* 161* 153* 142* 102*  BUN 47* 53* 51* 52* 40*  CREATININE 0.59 0.58 0.68 0.57 0.54  CALCIUM 8.6* 9.0 9.3 9.4 9.2  MG 2.3 2.3 2.5* 2.3 2.1  PHOS 2.0* 2.6 3.5 3.2 3.5   GFR: Estimated Creatinine Clearance: 58.6 mL/min (by C-G formula based on SCr of 0.54 mg/dL). Recent Labs  Lab 11/04/24 1132 11/04/24 1350 11/05/24 1212 11/06/24 0549 11/08/24 0315 11/09/24 0335 11/10/24 0509 11/11/24 0544  PROCALCITON  --   --  0.24  --   --   --   --   --   WBC 20.2*  --  19.1*   < > 13.4* 13.3* 10.9* 9.9  LATICACIDVEN 2.5* 1.0  --   --   --   --   --   --    < > = values in this interval not displayed.    Liver Function Tests: Recent Labs  Lab 11/04/24 1132 11/05/24 1212 11/06/24 0549 11/07/24 0515 11/08/24 0315 11/11/24 0544  AST 25  --   --   --   --  23  ALT 51*  --   --   --   --  32  ALKPHOS 138*  --   --   --   --  87  BILITOT 0.4  --   --   --   --  0.3  PROT 8.9*  --   --   --   --  5.8*  ALBUMIN 4.4 4.2 3.5 3.3* 3.3* 3.2*   No results for input(s): LIPASE,  AMYLASE in the last 168 hours. No results for input(s): AMMONIA in the last 168 hours.  ABG    Component Value Date/Time   PHART 7.28 (L) 11/06/2024 0045   PCO2ART 69 (HH) 11/06/2024 0045   PO2ART 98 11/06/2024 0045   HCO3 32.4 (H) 11/06/2024 0045   O2SAT 98.8 11/06/2024 0045     Coagulation Profile: No results for input(s): INR, PROTIME in the last 168 hours.  Cardiac Enzymes: No results for input(s): CKTOTAL, CKMB, CKMBINDEX, TROPONINI in the last 168 hours.  HbA1C: Hemoglobin A1C  Date/Time Value Ref Range Status  12/11/2023 04:01 PM 6.2 (A) 4.0 - 5.6 % Final   Hgb A1c MFr Bld  Date/Time Value Ref Range Status  05/22/2018 12:02 AM 6.2 (H) 4.8 - 5.6 % Final    Comment:    (NOTE) Pre diabetes:          5.7%-6.4% Diabetes:              >6.4% Glycemic control for   <7.0% adults with diabetes     CBG: Recent Labs  Lab 11/10/24 1610 11/10/24 1940 11/11/24 0038 11/11/24 0450 11/11/24 0724  GLUCAP 114* 129* 133* 99 112*    Review of Systems:   Positives in BOLD: Gen: Denies fever, chills, weight change, fatigue, night sweats, anxiety  HEENT: Denies blurred vision, double vision, hearing loss, tinnitus, sinus congestion, rhinorrhea, sore throat, neck stiffness, dysphagia PULM: Denies shortness of breath, cough, sputum production, hemoptysis, wheezing CV: Denies chest pain, edema, orthopnea, paroxysmal nocturnal dyspnea, palpitations GI: Denies abdominal pain, nausea, vomiting, diarrhea, hematochezia, melena, constipation, change in bowel habits  GU: Denies dysuria, hematuria, polyuria, oliguria, urethral discharge Endocrine: Denies hot or cold intolerance, polyuria, polyphagia or appetite change Derm: Denies rash, dry skin, scaling or peeling skin change Heme: Denies easy bruising, bleeding, bleeding gums Neuro: Denies headache, numbness, weakness, slurred speech, loss of memory or consciousness   Past Medical History:  She,  has a past medical  history of Bruises easily, Chronic respiratory failure with hypoxia (HCC), Cigarette smoker, Collapse of right lung, COPD (chronic obstructive pulmonary disease) (HCC), COVID-19 (07/2024), Dependence on supplemental oxygen , Depression with anxiety, Endometriosis, GERD (gastroesophageal reflux disease), Grade I diastolic dysfunction, History of sepsis, Hyperlipidemia, Hypertension (06/02/2012), Kidney stones, Pulmonary HTN (HCC), Reactive airway disease (03/08/2014), and Vitamin D  deficiency.   Surgical History:   Past Surgical History:  Procedure Laterality Date   BRONCHIAL WASHINGS N/A 09/17/2024   Procedure: IRRIGATION, BRONCHUS;  Surgeon: Parris Manna, MD;  Location: [facility] OR[facility];  Service: Thoracic;  Laterality: N/A;   CATARACT EXTRACTION W/PHACO Right 12/04/2023   Procedure: CATARACT EXTRACTION PHACO AND INTRAOCULAR LENS PLACEMENT (IOC) RIGHT  CLAREON VIVITY LENS;  Surgeon: Enola [name], MD;  Location: [facility];  Service: Ophthalmology;  Laterality: Right;  10.40 1:04.0   CATARACT EXTRACTION W/PHACO Left 12/18/2023   Procedure: CATARACT EXTRACTION PHACO AND INTRAOCULAR LENS PLACEMENT (IOC) LEFT  CLAREON VIVITY TORIC 16.28, 01:19.2;  Surgeon: Enola [name], MD;  Location: [facility];  Service: Ophthalmology;  Laterality: Left;   COLONOSCOPY WITH PROPOFOL  N/A 01/02/2023   Procedure: COLONOSCOPY WITH PROPOFOL ;  Surgeon: Unk Corinn Skiff, MD;  Location: [facility];  Service: Gastroenterology;  Laterality: N/A;   ESOPHAGOGASTRODUODENOSCOPY N/A 07/29/2018   Procedure: ESOPHAGOGASTRODUODENOSCOPY (EGD);  Surgeon: Toledo, Ladell POUR, MD;  Location: [facility];  Service: Gastroenterology;  Laterality: N/A;   ESOPHAGOGASTRODUODENOSCOPY (EGD) WITH PROPOFOL  N/A 01/02/2023   Procedure: ESOPHAGOGASTRODUODENOSCOPY (EGD) WITH PROPOFOL ;  Surgeon: Unk Corinn Skiff, MD;  Location: [facility];  Service: Gastroenterology;  Laterality: N/A;   FLEXIBLE BRONCHOSCOPY N/A  09/17/2024   Procedure: BRONCHOSCOPY, FLEXIBLE;  Surgeon: Parris Manna, MD;  Location: [facility];  Service: Thoracic;  Laterality: N/A;   IR RADIOLOGIST EVAL & MGMT  10/19/2024   KIDNEY STONE SURGERY     RIGHT HEART CATH Right 04/21/2024   Procedure: RIGHT HEART CATH;  Surgeon: Ammon Blunt, MD;  Location: [facility];  Service: Cardiovascular;  Laterality: Right;   TONSILLECTOMY       Social History:   reports that she quit smoking about 10 months ago. Her smoking use included cigarettes. She started smoking about 50 years ago. She has a 26.8 pack-year smoking history. She has never used smokeless tobacco. She reports current alcohol use. She reports that she does not use drugs.   Family History:  Her family history includes GER disease in her father and mother; Heart attack in her father; Hyperlipidemia in her mother; Hypertension in her father and mother; Parkinson's disease in her father.   Allergies Allergies[1]   Home Medications  Prior to Admission medications  Medication Sig Start Date End Date Taking? Authorizing Provider  ALPRAZolam  (XANAX ) 0.25 MG tablet Take 1 tablet (0.25 mg total) by mouth 3 (three) times daily as needed for anxiety. 12/11/23  Yes Abernathy, Mardy, NP  ascorbic acid  (VITAMIN C ) 500 MG tablet Take 500 mg by mouth daily.   Yes [provider]  aspirin  81 MG tablet Take 81 mg by mouth at bedtime.    Yes [provider]  azithromycin  (ZITHROMAX ) 250 MG tablet Take 250 mg by mouth  every other day. 10/06/24 11/14/24 Yes [provider]  BREZTRI  AEROSPHERE 160-9-4.8 MCG/ACT AERO inhaler Inhale 2 puffs into the lungs 2 (two) times daily. 04/12/24  Yes [provider]  buPROPion  (WELLBUTRIN ) 75 MG tablet Take 75 mg by mouth 2 (two) times daily.   Yes [provider]  Cholecalciferol  25 MCG (1000 UT) tablet Take 1,000 Units by mouth daily.    Yes [provider]  Coenzyme Q10 (CO Q-10) 100 MG CAPS  Take 100 mg by mouth daily.   Yes [provider]  DENTA 5000 PLUS 1.1 % CREA dental cream Take 1 application  by mouth 2 (two) times daily as needed. 04/14/21  Yes [provider]  dextromethorphan-guaiFENesin  (MUCINEX  DM) 30-600 MG 12hr tablet Take 1 tablet by mouth 2 (two) times daily as needed for cough. 10/02/24  Yes Caleen Qualia, MD  DULoxetine  (CYMBALTA ) 60 MG capsule Take 1 capsule (60 mg total) by mouth daily. 05/19/24  Yes Abernathy, Alyssa, NP  Dupilumab 300 MG/2ML SOAJ Inject 300 mg into the skin every 14 (fourteen) days. 01/27/24  Yes [provider]  EPINEPHrine  0.3 mg/0.3 mL IJ SOAJ injection Inject 0.3 mg into the muscle as needed. 01/27/24  Yes [provider]  famotidine  (PEPCID ) 40 MG tablet Take 1 tablet (40 mg total) by mouth 2 (two) times daily. 04/05/24  Yes Abernathy, Mardy, NP  fluticasone  (FLONASE ) 50 MCG/ACT nasal spray Place 2 sprays into both nostrils daily.   Yes [provider]  furosemide  (LASIX ) 40 MG tablet TAKE 1 TABLET BY MOUTH DAILY 04/12/24  Yes Abernathy, Alyssa, NP  gemfibrozil  (LOPID ) 600 MG tablet Take 1 tablet (600 mg total) by mouth 2 (two) times daily before a meal. 03/29/24  Yes Abernathy, Alyssa, NP  Ipratropium-Albuterol  (COMBIVENT  RESPIMAT) 20-100 MCG/ACT AERS respimat INHALE 1 PUFF INTO THE LUNGS EVERY 6 HOURS AS NEEDED FOR WHEEZING OR SHORTNESS OF BREATH 10/31/23  Yes Khan, Fozia M, MD  Magnesium  250 MG CAPS Take 250 mg by mouth daily.   Yes [provider]  metoprolol  tartrate (LOPRESSOR ) 25 MG tablet Take 0.5 tablets (12.5 mg total) by mouth 2 (two) times daily. 10/02/24  Yes Amin, Sumayya, MD  Multiple Vitamins-Minerals (MULTIVITAMIN WITH MINERALS) tablet Take 1 tablet by mouth daily. Centrum Silver 50+   Yes [provider]  Potassium Chloride  CR (MICRO-K ) 8 MEQ CPCR capsule CR Take 1 capsule (8 mEq total) by mouth daily. 10/26/23  Yes Khan, Fozia M, MD  predniSONE  (DELTASONE ) 5 MG tablet Take by  mouth. Take 9 tablets (45 mg total) by mouth once daily for 5 days, THEN 8 tablets (40 mg total) once daily for 5 days, THEN 7 tablets (35 mg total) once daily for 5 days, THEN 6 tablets (30 mg total) once daily for 5 days, THEN 5 tablets (25 mg total) once daily for 5 days, THEN 4 tablets (20 mg total) once daily for 5 days, THEN 3 tablets (15 mg total) once daily for 5 days, THEN 2 tablets (10 mg total) once daily for 5 days, THEN 1 tablet (5 mg total) once daily for 5 days, THEN 0.5 tablets (2.5 mg total) once daily for 5 days. 10/06/24 11/25/24 Yes [provider]  sucralfate  (CARAFATE ) 1 g tablet Take 1 g by mouth 4 (four) times daily as needed (Stomach).   Yes [provider]  vitamin E  180 MG (400 UNITS) capsule Take 400 Units by mouth daily.   Yes [provider]  colchicine 0.6 MG tablet  Take 0.6 mg by mouth daily. Patient not taking: Reported on 11/04/2024 09/13/24   [provider]  feeding supplement (ENSURE PLUS HIGH PROTEIN) LIQD Take 237 mLs by mouth 2 (two) times daily between meals. 10/03/24   Amin, Sumayya, MD  OXYGEN  Inhale 2 L into the lungs continuous. Continuous oxygen  and NIV at night, 2 liters   pt uses [facility] for oxygen  supplies    [provider]     Critical care time: 40 minutes     Inge Lecher, AGACNP-BC [facility] Prefer epic messenger for cross cover needs If after hours, please call E-link         [1]  Allergies Allergen Reactions   Naproxen Rash and Other (See Comments)    Spouse confirmed that pt breaks out in hives head to toe.    Sulfa Antibiotics Hives and Rash    Sulfa products.

## 2024-11-11 NOTE — Progress Notes (Signed)
 PHARMACY CONSULT NOTE - FOLLOW UP  Pharmacy Consult for Electrolyte Monitoring and Replacement   Recent Labs: Potassium (mmol/L)  Date Value  11/11/2024 3.9   Magnesium  (mg/dL)  Date Value  87/81/7974 2.1   Calcium (mg/dL)  Date Value  87/81/7974 9.2   Albumin (g/dL)  Date Value  87/81/7974 3.2 (L)  11/21/2023 4.4   Phosphorus (mg/dL)  Date Value  87/81/7974 3.5   Sodium (mmol/L)  Date Value  11/11/2024 146 (H)  11/21/2023 141    Assessment: 68 year old female with a history of smoking and COPD with chronic hypoxic and hypercapnic respiratory failure who presents to the hospital with increased shortness of breath, cough, and wheezing. Pharmacy is asked to follow and replace electrolytes while in CCU.   Goal of Therapy:  Electrolytes WNL  Plan:  ---no electrolyte replacement warranted for today ---recheck electrolytes in am  Rachael Blankenship ,PharmD Clinical Pharmacist 11/11/2024 7:20 AM

## 2024-11-11 NOTE — Progress Notes (Addendum)
 Speech Language Pathology Treatment: Dysphagia  Patient Details Name: Rachael Blankenship MRN: [id number] DOB: September 03, 1956 Today's Date: 11/11/2024 Time: 8444-8369 SLP Time Calculation (min) (ACUTE ONLY): 35 min  Assessment / Plan / Recommendation Clinical Impression  Pt w/ pt and Husband in room. Husband discussed some of the support he has been receiving including the Palliative Care meeting/consult he had today. He expressed appreciation for the opportunity to talk about GOC and his Wife's wishes(to return home).  Discussed also how to best support pt when taking bites and sips(oral intake) w/ conservation of energy strategies. Pt consumed a few sips of water  via straw holding the cup- she took rest breaks b/t trials as recommended by SLP to calm her breathing b/t sips. Discussed her challenges of her Pulmonary status on her ability to safely eat/drink and the need to strictly monitor her cues when eating/drinking. Husband agreed. NSG updated.  Noted the Palliative Care and Hospice visits and discussions per chart.   Recommend a more Pureed diet w/ moistened foods to reduce exertion w/ oral intake; thin liquids Via Cup w/ aspiration precautions. This diet can also help support conservation of energy. Pills given WHOLE vs CRUSHED in Puree to lessen risk for aspiration. STRICT aspiration precautions including frequent REST BREAKS during oral intake w/ close monitoring of RR and increased clavicular/mouth breathing, small bites/sips Slowly, NO STRAWS. GERD/REFLUX precautions.  ST services can be available if any further needs while admitted- MD to reconsult ST.        HPI HPI: Pt is a 68 y.o. female with medical history significant of COPD Gold stage IV, chronic hypoxic and hypercapnic respiratory failure on as needed oxygen  and nocturnal BiPAP, GERD, HTN, chronic HFpEF, pulmonary hypertension, HLD, presented with worsening of cough wheezing shortness of breath.  Symptoms started about 7 days ago  when patient started to have a dry cough wheezing and increasing exertional dyspnea.  Denies any fever chills no chest pains.  Was started on additional albuterol  inhaler on Monday however patient did not tolerate albuterol  inhaler as she felt  palpitations and chest tightness as a result her breathing symptoms has progressively getting worse over the last 3 days.  No fever or chills.  intial CXR: Negative for acute infiltrates.  Patient developed worsening respiratory distress with increased work of breathing and failure to maintain adequate ventilation despite noninvasive support so decision was made to intubate on 12/13; extubated on 12/15.  Currrently on HFNC O2 support.      SLP Plan           Swallow Evaluation Recommendations   Recommendations: PO diet PO Diet Recommendation: Dysphagia 1 (Pureed);Thin liquids (Level 0) Liquid Administration via: Cup;Straw (support) Medication Administration: Crushed with puree Supervision: Patient able to self-feed;Staff to assist with self-feeding;Full supervision/cueing for swallowing strategies Postural changes: Position pt fully upright for meals;Stay upright 30-60 min after meals Oral care recommendations: Oral care BID (2x/day);Oral care before PO;Staff/trained caregiver to provide oral care Recommended consults: Consider Palliative care     Recommendations                    (Palliative Care) Oral care BID;Oral care before and after PO;Staff/trained caregiver to provide oral care   Frequent or constant Supervision/Assistance (to support) Dysphagia, oropharyngeal phase (R13.12) (in setting of Declined Pulmohary status Baseline/currently- COPD; WOB/SOB w/ any exertion; deconditioned; support at meals)              Comer Portugal, MS, CCC-SLP Speech Language Pathologist Rehab Services;  [facility] [address] [phone] (ascom) Natoya Viscomi  11/11/2024, 4:49 PM

## 2024-11-11 NOTE — Consult Note (Signed)
 Consultation Note Date: 11/11/2024   Patient Name: Rachael Blankenship  DOB: Apr 19, 1956  MRN: [id number]  Age / Sex: 68 y.o., female  PCP: Fernande Ophelia JINNY DOUGLAS, MD Referring Physician: Isaiah Scrivener, MD  Reason for Consultation:   goals of care  HPI/Patient Profile: 68 y.o. female  with past medical history of HTN, HFpEF, pulmonary HTN, chronic respiratory failure, wears bipap at night, advanced COPD admitted on 11/04/2024 with COPD exacerbation. Initially required intubation- was extubated on 12/15. Continues on HHFNC at 40LPM. Palliative medicine consulted for goals of care.    Primary Decision Maker PATIENT- and spouse - Rachael Blankenship  Discussion: Chart reviewed including labs, progress notes, imaging from this and previous encounters.  Chest xrays reviewed- atelectasis.  Labs- WBC was elevated on admission- today down to 9.9. Kidney function is stable. Patient hospitalized in October and November with COPD exacerbations.  On evaluation she is awake, alert, oriented- but admits to having some confusion at times and hallucinations.  Spouse Samuel at bedside.  [place] Daughter lives close by. [name] has a cherished dachshund puppy at home named Jacinto and a 4 year old cat named Jone- she is missing them a great deal.  We discussed her current acute and chronic health issues.  At home she has difficulty ambulating without getting short of breath. Some shortness of breath at rest.  Currently she is feeling very anxious and short of breath.  She shares that her primary goal is to be at home. She does not want to die in the hospital- she is very concerned about this.  We discussed her barriers which include the amount of oxygen  she is requiring is too high for transport home.  Once home- if her status worsened- she would not want to return to the hospital- she would want comfort  measures only. We discussed comfort measures would include utilizing medications such as opioids to take away anxiety and shortness of breath and allow natural dying process.  Hospice philosophy of care and services were reviewed.  Patient shared she is clear in her goal to try and get home with hospice support as soon as her oxygen  levels will allow transport.  We discussed hoping for the best and meeting her goal- but also discussed what if her condition worsens while she is in the hospital. She would not want to be intubated again. Her preference would be for comfort only.  Case was discussed with attending team and bedside RN.    SUMMARY OF RECOMMENDATIONS -Advanced COPD- GOC is to try and get oxygen  levels down to where patient can transport home with hospice - she does not want to die in the hospital -Start concentrated liquid morphine - 5mg  po q2hr prn for shortness of breath and anxiety -TOC order placed for hospice referral    Code Status/Advance Care Planning:   Code Status: Limited: Do not attempt resuscitation (DNR) -DNR-LIMITED -Do Not Intubate/DNI     Prognosis:   Unable to determine  Discharge Planning:  Home with Hospice  Primary Diagnoses: Present on Admission:  Acute on chronic respiratory failure with hypoxia (HCC)  COPD (chronic obstructive pulmonary disease) (HCC)   Review of Systems  Constitutional:  Positive for activity change, appetite change and fatigue.  Respiratory:  Positive for shortness of breath.   Psychiatric/Behavioral:  Positive for hallucinations.     Physical Exam Vitals and nursing note reviewed.  Constitutional:      Appearance: She is ill-appearing.  Cardiovascular:     Rate and Rhythm: Tachycardia present.  Pulmonary:     Comments: Increased effort and rate Neurological:     Mental Status: She is oriented to person, place, and time.     Vital Signs: BP (!) 141/102   Pulse (!) 117   Temp 98.3 F (36.8 C) (Oral)   Resp (!) 25   Ht  5' 3 (1.6 m)   Wt 63.1 kg   SpO2 92%   BMI 24.64 kg/m  Pain Scale: 0-10 POSS *See Group Information*: S-Acceptable,Sleep, easy to arouse Pain Score: 0-No pain   SpO2: SpO2: 92 % O2 Device:SpO2: 92 % O2 Flow Rate: .O2 Flow Rate (L/min): 40 L/min  IO: Intake/output summary:  Intake/Output Summary (Last 24 hours) at 11/11/2024 1427 Last data filed at 11/11/2024 1404 Gross per 24 hour  Intake 765.44 ml  Output 1175 ml  Net -409.56 ml    LBM: Last BM Date : 11/11/24 Baseline Weight: Weight: 60.8 kg Most recent weight: Weight: 63.1 kg       Thank you for this consult. Palliative medicine will continue to follow and assist as needed.  Time Total: 100 minutes Signed by: Cassondra Stain, AGNP-C Palliative Medicine  Time includes:   Preparing to see the patient (e.g., review of tests) Obtaining and/or reviewing separately obtained history Performing a medically necessary appropriate examination and/or evaluation Counseling and educating the patient/family/caregiver Ordering medications, tests, or procedures Referring and communicating with other health care professionals (when not reported separately) Documenting clinical information in the electronic or other health record Independently interpreting results (not reported separately) and communicating results to the patient/family/caregiver Care coordination (not reported separately) Clinical documentation   Please contact Palliative Medicine Team phone at [phone] for questions and concerns.  For individual provider: See Tracey

## 2024-11-11 NOTE — Plan of Care (Signed)
  Problem: Clinical Measurements: Goal: Diagnostic test results will improve Outcome: Progressing Goal: Respiratory complications will improve Outcome: Progressing Goal: Cardiovascular complication will be avoided Outcome: Progressing   Problem: Activity: Goal: Risk for activity intolerance will decrease Outcome: Not Progressing   Problem: Coping: Goal: Level of anxiety will decrease Outcome: Not Progressing

## 2024-11-12 DIAGNOSIS — Z7189 Other specified counseling: Secondary | ICD-10-CM

## 2024-11-12 LAB — GLUCOSE, CAPILLARY
Glucose-Capillary: 97 mg/dL (ref 70–99)
Glucose-Capillary: 130 mg/dL — ABNORMAL HIGH (ref 70–99)
Glucose-Capillary: 155 mg/dL — ABNORMAL HIGH (ref 70–99)
Glucose-Capillary: 193 mg/dL — ABNORMAL HIGH (ref 70–99)
Glucose-Capillary: 160 mg/dL — ABNORMAL HIGH (ref 70–99)
Glucose-Capillary: 156 mg/dL — ABNORMAL HIGH (ref 70–99)

## 2024-11-12 LAB — CBC
HCT: 33.7 % — ABNORMAL LOW (ref 36.0–46.0)
Hemoglobin: 10.2 g/dL — ABNORMAL LOW (ref 12.0–15.0)
MCH: 27.3 pg (ref 26.0–34.0)
MCHC: 30.3 g/dL (ref 30.0–36.0)
MCV: 90.1 fL (ref 80.0–100.0)
Platelets: 314 K/uL (ref 150–400)
RBC: 3.74 MIL/uL — ABNORMAL LOW (ref 3.87–5.11)
RDW: 15 % (ref 11.5–15.5)
WBC: 9.1 K/uL (ref 4.0–10.5)
nRBC: 0 % (ref 0.0–0.2)

## 2024-11-12 LAB — BASIC METABOLIC PANEL WITH GFR
Anion gap: 7 (ref 5–15)
BUN: 36 mg/dL — ABNORMAL HIGH (ref 8–23)
CO2: 33 mmol/L — ABNORMAL HIGH (ref 22–32)
Calcium: 9.1 mg/dL (ref 8.9–10.3)
Chloride: 105 mmol/L (ref 98–111)
Creatinine, Ser: 0.51 mg/dL (ref 0.44–1.00)
GFR, Estimated: >60 mL/min
Glucose, Bld: 111 mg/dL — ABNORMAL HIGH (ref 70–99)
Potassium: 3.5 mmol/L (ref 3.5–5.1)
Sodium: 145 mmol/L (ref 135–145)

## 2024-11-12 LAB — MAGNESIUM: Magnesium: 1.9 mg/dL (ref 1.7–2.4)

## 2024-11-12 LAB — PHOSPHORUS: Phosphorus: 3.5 mg/dL (ref 2.5–4.6)

## 2024-11-12 MED ORDER — DIAZEPAM 2 MG PO TABS
2.0000 mg | ORAL_TABLET | Freq: Once | ORAL | Status: AC
  Administered 2024-11-12: 2 mg via ORAL
  Filled 2024-11-12: qty 1

## 2024-11-12 MED ORDER — FUROSEMIDE 10 MG/ML IJ SOLN
40.0000 mg | Freq: Once | INTRAMUSCULAR | Status: AC
  Administered 2024-11-12: 40 mg via INTRAVENOUS
  Filled 2024-11-12: qty 4

## 2024-11-12 MED ORDER — MAGNESIUM SULFATE IN D5W 1-5 GM/100ML-% IV SOLN
1.0000 g | Freq: Once | INTRAVENOUS | Status: AC
  Administered 2024-11-12: 1 g via INTRAVENOUS
  Filled 2024-11-12: qty 100

## 2024-11-12 MED ORDER — POTASSIUM CHLORIDE CRYS ER 20 MEQ PO TBCR
40.0000 meq | EXTENDED_RELEASE_TABLET | Freq: Once | ORAL | Status: AC
  Administered 2024-11-12: 40 meq via ORAL
  Filled 2024-11-12: qty 2

## 2024-11-12 MED ORDER — DIAZEPAM 5 MG PO TABS
5.0000 mg | ORAL_TABLET | Freq: Three times a day (TID) | ORAL | Status: DC
  Administered 2024-11-12 – 2024-11-15 (×10): 5 mg via ORAL
  Filled 2024-11-12 (×10): qty 1

## 2024-11-12 MED ORDER — K PHOS MONO-SOD PHOS DI & MONO 155-852-130 MG PO TABS
500.0000 mg | ORAL_TABLET | Freq: Once | ORAL | Status: AC
  Administered 2024-11-12: 500 mg via ORAL
  Filled 2024-11-12: qty 2

## 2024-11-12 MED ORDER — MORPHINE SULFATE 10 MG/5ML PO SOLN
5.0000 mg | ORAL | Status: DC
  Administered 2024-11-12 – 2024-11-15 (×15): 5 mg via ORAL
  Filled 2024-11-12 (×15): qty 5

## 2024-11-12 NOTE — Progress Notes (Signed)
 "  NAME:  Rachael Blankenship, MRN:  [id number], DOB:  1956-09-29, LOS: 8 ADMISSION DATE:  11/04/2024, CONSULTATION DATE:  11/05/2024 CHIEF COMPLAINT:  Respiratory Failure    Brief Pt Description / Synopsis:  69 y.o female admitted with Acute on Chronic Hypoxic and Hypercapnic Respiratory Failure due to severe Acute COPD Exacerbation, failed trial of BiPAP requiring intubation and mechanical ventilation.   History of Present Illness:  Patient is a 68 year old female with a history of smoking and COPD with chronic hypoxic and hypercapnic respiratory failure who presents to the hospital with increased shortness of breath, cough, and wheezing consistent with a COPD exacerbation.   Patient reports symptom onset around 7 days ago with worsening shortness of breath, cough, wheezing, and increased exertional dyspnea.  She reports having increased oxygen  requirements and inability to walk or move short distances without having significant desaturation.  This further worsened over this week and she was unable to wait until her scheduled appointment with her primary pulmonologist prompting her to present to the ED.   EMS was activated and she was brought in with hypoxia and increased work of breathing.  She received IV steroids and was started on BiPAP for respiratory distress and admitted to the medicine service for further workup and management.  Patient started on IV Solu-Medrol , doxycycline , and received a dose of acetazolamide .  Given lack of improvement today, pulmonary and critical care medicine were consulted for increased risk of respiratory failure.   Medical chart reviewed, patient had a history of smoking and she quit 1 year ago.  Patient has been followed by outpatient pulmonary for severe COPD with her last FEV1 at 0.61 L (29% predicted).  She has chronic hypoxia for which she was started on supplemental oxygen .   Patient was admitted to the hospital in January 2025 with acute on chronic hypoxic  respiratory failure with ICU admission.  She was continued on nocturnal BiPAP after send admission and switched to triple therapy with Breztri  and Ensifentrine  was considered.  She was started on Dupixent to reduce frequency of COPD exacerbations early in 2025.  She was further referred to cardiology for consideration of a right heart catheterization for workup of pulmonary hypertension.   Right heart cath 04/21/2024 showed RA 10/6, PA 40/12 (24), and PCWP of 20 not indicative of pulmonary hypertension.   CT chest ruled out PE in October 2025 after an exacerbation.  This was notable for unchanged complete collapse of the right middle lobe with atelectasis in the lingula.  She then underwent bronchoscopy with BAL of the right middle lobe with cultures returning negative.   Patient then admitted in November 2025 with COPD exacerbation treated with antibiotics and discharged on levofloxacin  as well as a prednisone  taper.  Patient seen by her pulmonologist in early November where her prednisone  taper was extended (taper by 5 mg every 5 days).  She was also started on thrice weekly azithromycin .  [facility] asked to admit for further workup and treatment.  Please see Significant Hospital Events section below for full detailed hospital course.   Pertinent  Medical History   Past Medical History:  Diagnosis Date   Bruises easily    Chronic respiratory failure with hypoxia (HCC)    Cigarette smoker    Collapse of right lung    COPD (chronic obstructive pulmonary disease) (HCC)    COVID-19 07/2024   Dependence on supplemental oxygen     Depression with anxiety    Endometriosis    GERD (gastroesophageal reflux disease)  Grade I diastolic dysfunction    History of sepsis    Hyperlipidemia    Hypertension 06/02/2012   Kidney stones    Pulmonary HTN (HCC)    Reactive airway disease 03/08/2014   Vitamin D  deficiency     Micro Data:  12/11: COVID/FLU/RSV PCR>> negative 12/11; Blood cultures x2>>  no growth  12/12: RVP>> negative  12/12: Strep pneumo urinary antigen>> negative  12/12: Legionella urinary antigen>> negative 12/12: MRSA PCR>>negative  12/13: Tracheal aspirate>> rare candida albicans  Antimicrobials:   Anti-infectives (From admission, onward)    Start     Dose/Rate Route Frequency Ordered Stop   11/07/24 1400  piperacillin -tazobactam (ZOSYN ) IVPB 3.375 g        3.375 g 12.5 mL/hr over 240 Minutes Intravenous Every 8 hours 11/07/24 1026 11/11/24 2359   11/05/24 1430  ceFEPIme  (MAXIPIME ) 2 g in sodium chloride  0.9 % 100 mL IVPB  Status:  Discontinued        2 g 200 mL/hr over 30 Minutes Intravenous Every 12 hours 11/05/24 1322 11/07/24 1009   11/04/24 2315  doxycycline  (VIBRAMYCIN ) 100 mg in sodium chloride  0.9 % 250 mL IVPB  Status:  Discontinued        100 mg 125 mL/hr over 120 Minutes Intravenous 2 times daily 11/04/24 2300 11/09/24 1056   11/04/24 1445  doxycycline  (VIBRA -TABS) tablet 100 mg  Status:  Discontinued        100 mg Oral Every 12 hours 11/04/24 1432 11/04/24 2304   11/04/24 1130  cefTRIAXone  (ROCEPHIN ) 2 g in sodium chloride  0.9 % 100 mL IVPB        2 g 200 mL/hr over 30 Minutes Intravenous Once 11/04/24 1121 11/04/24 1210   11/04/24 1130  azithromycin  (ZITHROMAX ) 500 mg in sodium chloride  0.9 % 250 mL IVPB        500 mg 250 mL/hr over 60 Minutes Intravenous  Once 11/04/24 1121 11/04/24 1314       Significant Hospital Events: Including procedures, antibiotic start and stop dates in addition to other pertinent events   11/04/24: Admitted with COPD exacerbation, on BiPAP 11/05/24: Consult to critical care for respiratory failure and risk of decompensation and need for intubation and mechanical ventilation. Intubated at night 11/06/24: On dexmedetomidine  and fentanyl  for analgesia and sedation. Synchronous with the vent. Family updated at bedside at length 11/07/24: WUA this AM, agitated on dexmedetomidine  (prop/fentanyl  dc'd). Attempt SBT but  failed due to sever anxiety 11/08/24; No significant events overnight. Afebrile, hemodynamically stable, off Levophed .  On minimal vent support, plan for WUA and SBT utilizing Precedex .  EXTUBATED to HFNC, respiratory status remains tenuous. 11/09/24: Extubated yesterday, Patient on HHFNC 30% 45 l/m, Patient states breathing is fine. Some somewhat breathless when trying to speak. Rr upper 20s. Per nurse patient gets anxious and pulls at things. On 1 precedex . Code status changed to DNR (ok with intubation).  Passed swallow evaluation. 11/10/24: No acute events overnight.  Afebrile, not requiring Levophed .  Tolerated BiPaP at bedtime, remains on Precedex  (weaning down), respiratory status slowly improving, remains on HHFNC. 11/11/24: No acute events overnight, tolerated BiPAP with Precedex , requesting to go back to HFNC.  Attempt to wean off Precedex  today, will start Clonidine  to assist with weaning.  Transition IV to PO Valium .  Decrease solumedrol to 20 mg daily.  Diurese with 20 mg Lasix  x1 dose. Consult Palliative Care to assist with GOC discussions. Code status changed to DNR/DNI, goal to discharge home with hospice.  11/12/24: No acute events  overnight. Afebrile, hemodynamically stable.  Tolerated BiPAP overnight, remains on Precedex , now on HHFNC but respiratory status remains tenuous.  Diurese with 40 mg IV lasix  x1 dose.  Family wanting to take pt home with hospice.   Interim History / Subjective:  As outlined above under Significant Hospital Events section  Objective   Blood pressure (!) 166/79, pulse (!) 108, temperature 98.1 F (36.7 C), temperature source Oral, resp. rate (!) 23, height 5' 3 (1.6 m), weight 63.1 kg, SpO2 (!) 85%.    FiO2 (%):  [30 %-35 %] 35 % PEEP:  [5 cmH20] 5 cmH20   Intake/Output Summary (Last 24 hours) at 11/12/2024 0730 Last data filed at 11/11/2024 1839 Gross per 24 hour  Intake 416.26 ml  Output 400 ml  Net 16.26 ml   Filed Weights   11/09/24 0429  11/10/24 0500 11/11/24 0500  Weight: 61.3 kg 62 kg 63.1 kg    Examination: General: Acute on chronically ill appearing female, laying in bed, on HHFNC, on Precedex , in NAD HENT: Atraumatic, normocephalic, neck supple, no JVD Lungs: Diminished breath sounds throughout, even, family reports she is at her baseline effort Cardiovascular: Tachycardia, regular rhythm, s1s2, no M/R/G Abdomen: Soft, nontender, nondistended, no guarding or rebound tenderness, BS+ x4 Extremities: Normal bulk and tone, no deformities, no edema, good peripheral perfusion Neuro: Awake on Precedex , alert and mildly anxious, following commands, no focal deficits noted, PERRL GU: External catheter in place    Resolved Hospital Problem list     Assessment & Plan:   #Acute on Chronic Hypoxic & Hypercapnic Respiratory Failure #COPD Exacerbation #Chronic Atelectasis of RML PMHx: COPD on Breztri  and Dupilumab EXTUBATED 12/15 -Supplemental O2 as needed to maintain O2 sats 88 To 92% -BiPAP at bedtime and PRN  -Follow intermittent Chest X-ray & ABG as needed -Bronchodilators & Pulmicort  nebs -IV Steroids ~ decrease to 20 mg daily on 12/18 -ABX as above ~ Zosyn  course to complete on 12/18 -Diuresis as BP and renal function permits ~ diurese with 40 mg IV Lasix  x1 dose 12/19 -Pulmonary toilet as able  #Hypertension PMHx: HFpEF, HLD Echo 11/2023: Grade I diastolic dysfunction -Continuous cardiac monitoring -Maintain MAP >65 -Cautious IV fluids -Diuresis as BP and renal function permits -Continue Clonidine    #Anemia -Monitor for S/Sx of bleeding -Trend CBC -Lovenox  for VTE Prophylaxis  -Transfuse for Hgb <7  #Acute Metabolic Encephalopathy PMHx: Anxiety/depression -Treatment of metabolic derangements as outlined above -Provide supportive care -Promote normal sleep/wake cycle and family presence -Avoid sedating medications as able -Continue Precedex  as needed ~ attempt to wean off 12/18 ~ continue Clonidine   to assist with weaning  -Increase Valium  to 5 mg q8h  -Continue morphine  for air hunger  -Continue Wellbutrin  and Cymbalta         Best Practice (right click and Reselect all SmartList Selections daily)   Diet/type: Regular  DVT prophylaxis: LMWH GI prophylaxis: PPI Lines: N/A Foley:  N/A Code Status:  DNR/DNI Last date of multidisciplinary goals of care discussion [12/19]  12/19: Updated pt's husband and pt's sister at bedside on plan of care.  Labs   CBC: Recent Labs  Lab 11/08/24 0315 11/09/24 0335 11/10/24 0509 11/11/24 0544 11/12/24 0345  WBC 13.4* 13.3* 10.9* 9.9 9.1  HGB 8.8* 9.5* 9.5* 9.7* 10.2*  HCT 29.0* 32.5* 30.9* 31.8* 33.7*  MCV 90.9 93.1 90.1 88.8 90.1  PLT 371 373 317 317 314    Basic Metabolic Panel: Recent Labs  Lab 11/08/24 0315 11/09/24 0335 11/10/24 0509 11/11/24 0544 11/12/24  0345  NA 144 148* 149* 146* 145  K 4.5 3.9 3.8 3.9 3.5  CL 110 110 113* 106 105  CO2 28 29 29  32 33*  GLUCOSE 161* 153* 142* 102* 111*  BUN 53* 51* 52* 40* 36*  CREATININE 0.58 0.68 0.57 0.54 0.51  CALCIUM 9.0 9.3 9.4 9.2 9.1  MG 2.3 2.5* 2.3 2.1 1.9  PHOS 2.6 3.5 3.2 3.5 3.5   GFR: Estimated Creatinine Clearance: 58.6 mL/min (by C-G formula based on SCr of 0.51 mg/dL). Recent Labs  Lab 11/05/24 1212 11/06/24 0549 11/09/24 0335 11/10/24 0509 11/11/24 0544 11/12/24 0345  PROCALCITON 0.24  --   --   --   --   --   WBC 19.1*   < > 13.3* 10.9* 9.9 9.1   < > = values in this interval not displayed.    Liver Function Tests: Recent Labs  Lab 11/05/24 1212 11/06/24 0549 11/07/24 0515 11/08/24 0315 11/11/24 0544  AST  --   --   --   --  23  ALT  --   --   --   --  32  ALKPHOS  --   --   --   --  87  BILITOT  --   --   --   --  0.3  PROT  --   --   --   --  5.8*  ALBUMIN 4.2 3.5 3.3* 3.3* 3.2*   No results for input(s): LIPASE, AMYLASE in the last 168 hours. No results for input(s): AMMONIA in the last 168 hours.  ABG    Component  Value Date/Time   PHART 7.28 (L) 11/06/2024 0045   PCO2ART 69 (HH) 11/06/2024 0045   PO2ART 98 11/06/2024 0045   HCO3 32.4 (H) 11/06/2024 0045   O2SAT 98.8 11/06/2024 0045     Coagulation Profile: No results for input(s): INR, PROTIME in the last 168 hours.  Cardiac Enzymes: No results for input(s): CKTOTAL, CKMB, CKMBINDEX, TROPONINI in the last 168 hours.  HbA1C: Hemoglobin A1C  Date/Time Value Ref Range Status  12/11/2023 04:01 PM 6.2 (A) 4.0 - 5.6 % Final   Hgb A1c MFr Bld  Date/Time Value Ref Range Status  05/22/2018 12:02 AM 6.2 (H) 4.8 - 5.6 % Final    Comment:    (NOTE) Pre diabetes:          5.7%-6.4% Diabetes:              >6.4% Glycemic control for   <7.0% adults with diabetes     CBG: Recent Labs  Lab 11/11/24 1657 11/11/24 1936 11/11/24 2314 11/12/24 0321 11/12/24 0701  GLUCAP 209* 154* 134* 97 130*    Review of Systems:   Positives in BOLD: Gen: Denies fever, chills, weight change, fatigue, night sweats, anxiety  HEENT: Denies blurred vision, double vision, hearing loss, tinnitus, sinus congestion, rhinorrhea, sore throat, neck stiffness, dysphagia PULM: Denies shortness of breath, cough, sputum production, hemoptysis, wheezing CV: Denies chest pain, edema, orthopnea, paroxysmal nocturnal dyspnea, palpitations GI: Denies abdominal pain, nausea, vomiting, diarrhea, hematochezia, melena, constipation, change in bowel habits GU: Denies dysuria, hematuria, polyuria, oliguria, urethral discharge Endocrine: Denies hot or cold intolerance, polyuria, polyphagia or appetite change Derm: Denies rash, dry skin, scaling or peeling skin change Heme: Denies easy bruising, bleeding, bleeding gums Neuro: Denies headache, numbness, weakness, slurred speech, loss of memory or consciousness   Past Medical History:  She,  has a past medical history of Bruises easily, Chronic respiratory failure with hypoxia (  HCC), Cigarette smoker, Collapse of right  lung, COPD (chronic obstructive pulmonary disease) (HCC), COVID-19 (07/2024), Dependence on supplemental oxygen , Depression with anxiety, Endometriosis, GERD (gastroesophageal reflux disease), Grade I diastolic dysfunction, History of sepsis, Hyperlipidemia, Hypertension (06/02/2012), Kidney stones, Pulmonary HTN (HCC), Reactive airway disease (03/08/2014), and Vitamin D  deficiency.   Surgical History:   Past Surgical History:  Procedure Laterality Date   BRONCHIAL WASHINGS N/A 09/17/2024   Procedure: IRRIGATION, BRONCHUS;  Surgeon: Parris Manna, MD;  Location: [facility] ORS;  Service: Thoracic;  Laterality: N/A;   CATARACT EXTRACTION W/PHACO Right 12/04/2023   Procedure: CATARACT EXTRACTION PHACO AND INTRAOCULAR LENS PLACEMENT (IOC) RIGHT  CLAREON VIVITY LENS;  Surgeon: Enola [name], MD;  Location: [facility];  Service: Ophthalmology;  Laterality: Right;  10.40 1:04.0   CATARACT EXTRACTION W/PHACO Left 12/18/2023   Procedure: CATARACT EXTRACTION PHACO AND INTRAOCULAR LENS PLACEMENT (IOC) LEFT  CLAREON VIVITY TORIC 16.28, 01:19.2;  Surgeon: Enola [name], MD;  Location: [facility];  Service: Ophthalmology;  Laterality: Left;   COLONOSCOPY WITH PROPOFOL  N/A 01/02/2023   Procedure: COLONOSCOPY WITH PROPOFOL ;  Surgeon: Unk Corinn Skiff, MD;  Location: [facility] END[facility];  Service: Gastroenterology;  Laterality: N/A;   ESOPHAGOGASTRODUODENOSCOPY N/A 07/29/2018   Procedure: ESOPHAGOGASTRODUODENOSCOPY (EGD);  Surgeon: Toledo, Ladell POUR, MD;  Location: [facility] ENDOSCOPY;  Service: Gastroenterology;  Laterality: N/A;   ESOPHAGOGASTRODUODENOSCOPY (EGD) WITH PROPOFOL  N/A 01/02/2023   Procedure: ESOPHAGOGASTRODUODENOSCOPY (EGD) WITH PROPOFOL ;  Surgeon: Unk Corinn Skiff, MD;  Location: [facility] END[facility];  Service: Gastroenterology;  Laterality: N/A;   FLEXIBLE BRONCHOSCOPY N/A 09/17/2024   Procedure: BRONCHOSCOPY, FLEXIBLE;  Surgeon: Parris Manna, MD;  Location: [facility] ORS;  Service:  Thoracic;  Laterality: N/A;   IR RADIOLOGIST EVAL & MGMT  10/19/2024   KIDNEY STONE SURGERY     RIGHT HEART CATH Right 04/21/2024   Procedure: RIGHT HEART CATH;  Surgeon: Ammon Blunt, MD;  Location: [facility] INVASIVE CV LAB;  Service: Cardiovascular;  Laterality: Right;   TONSILLECTOMY       Social History:   reports that she quit smoking about 10 months ago. Her smoking use included cigarettes. She started smoking about 51 years ago. She has a 26.8 pack-year smoking history. She has never used smokeless tobacco. She reports current alcohol use. She reports that she does not use drugs.   Family History:  Her family history includes GER disease in her father and mother; Heart attack in her father; Hyperlipidemia in her mother; Hypertension in her father and mother; Parkinson's disease in her father.   Allergies Allergies[1]   Home Medications  Prior to Admission medications  Medication Sig Start Date End Date Taking? Authorizing Provider  ALPRAZolam  (XANAX ) 0.25 MG tablet Take 1 tablet (0.25 mg total) by mouth 3 (three) times daily as needed for anxiety. 12/11/23  Yes Abernathy, Mardy, NP  ascorbic acid  (VITAMIN C ) 500 MG tablet Take 500 mg by mouth daily.   Yes [provider]  aspirin  81 MG tablet Take 81 mg by mouth at bedtime.    Yes [provider]  azithromycin  (ZITHROMAX ) 250 MG tablet Take 250 mg by mouth every other day. 10/06/24 11/14/24 Yes [provider]  BREZTRI  AEROSPHERE 160-9-4.8 MCG/ACT AERO inhaler Inhale 2 puffs into the lungs 2 (two) times daily. 04/12/24  Yes [provider]  buPROPion  (WELLBUTRIN ) 75 MG tablet Take 75 mg by mouth 2 (two) times daily.   Yes [provider]  Cholecalciferol  25 MCG (1000 UT) tablet Take 1,000 Units by mouth daily.  Yes [provider]  Coenzyme Q10 (CO Q-10) 100 MG CAPS Take 100 mg by mouth daily.   Yes [provider]  DENTA 5000 PLUS 1.1 % CREA dental cream Take 1  application  by mouth 2 (two) times daily as needed. 04/14/21  Yes [provider]  dextromethorphan-guaiFENesin  (MUCINEX  DM) 30-600 MG 12hr tablet Take 1 tablet by mouth 2 (two) times daily as needed for cough. 10/02/24  Yes Caleen Qualia, MD  DULoxetine  (CYMBALTA ) 60 MG capsule Take 1 capsule (60 mg total) by mouth daily. 05/19/24  Yes Abernathy, Alyssa, NP  Dupilumab 300 MG/2ML SOAJ Inject 300 mg into the skin every 14 (fourteen) days. 01/27/24  Yes [provider]  EPINEPHrine  0.3 mg/0.3 mL IJ SOAJ injection Inject 0.3 mg into the muscle as needed. 01/27/24  Yes [provider]  famotidine  (PEPCID ) 40 MG tablet Take 1 tablet (40 mg total) by mouth 2 (two) times daily. 04/05/24  Yes Abernathy, Mardy, NP  fluticasone  (FLONASE ) 50 MCG/ACT nasal spray Place 2 sprays into both nostrils daily.   Yes [provider]  furosemide  (LASIX ) 40 MG tablet TAKE 1 TABLET BY MOUTH DAILY 04/12/24  Yes Abernathy, Mardy, NP  gemfibrozil  (LOPID ) 600 MG tablet Take 1 tablet (600 mg total) by mouth 2 (two) times daily before a meal. 03/29/24  Yes Abernathy, Alyssa, NP  Ipratropium-Albuterol  (COMBIVENT  RESPIMAT) 20-100 MCG/ACT AERS respimat INHALE 1 PUFF INTO THE LUNGS EVERY 6 HOURS AS NEEDED FOR WHEEZING OR SHORTNESS OF BREATH 10/31/23  Yes Khan, Fozia M, MD  Magnesium  250 MG CAPS Take 250 mg by mouth daily.   Yes [provider]  metoprolol  tartrate (LOPRESSOR ) 25 MG tablet Take 0.5 tablets (12.5 mg total) by mouth 2 (two) times daily. 10/02/24  Yes Amin, Sumayya, MD  Multiple Vitamins-Minerals (MULTIVITAMIN WITH MINERALS) tablet Take 1 tablet by mouth daily. Centrum Silver 50+   Yes [provider]  Potassium Chloride  CR (MICRO-K ) 8 MEQ CPCR capsule CR Take 1 capsule (8 mEq total) by mouth daily. 10/26/23  Yes Khan, Fozia M, MD  predniSONE  (DELTASONE ) 5 MG tablet Take by mouth. Take 9 tablets (45 mg total) by mouth once daily for 5 days, THEN 8 tablets (40 mg total) once daily  for 5 days, THEN 7 tablets (35 mg total) once daily for 5 days, THEN 6 tablets (30 mg total) once daily for 5 days, THEN 5 tablets (25 mg total) once daily for 5 days, THEN 4 tablets (20 mg total) once daily for 5 days, THEN 3 tablets (15 mg total) once daily for 5 days, THEN 2 tablets (10 mg total) once daily for 5 days, THEN 1 tablet (5 mg total) once daily for 5 days, THEN 0.5 tablets (2.5 mg total) once daily for 5 days. 10/06/24 11/25/24 Yes [provider]  sucralfate  (CARAFATE ) 1 g tablet Take 1 g by mouth 4 (four) times daily as needed (Stomach).   Yes [provider]  vitamin E  180 MG (400 UNITS) capsule Take 400 Units by mouth daily.   Yes [provider]  colchicine 0.6 MG tablet Take 0.6 mg by mouth daily. Patient not taking: Reported on 11/04/2024 09/13/24   [provider]  feeding supplement (ENSURE PLUS HIGH PROTEIN) LIQD Take 237 mLs by mouth 2 (two) times daily between meals. 10/03/24   Amin, Sumayya, MD  OXYGEN  Inhale 2 L into the lungs continuous. Continuous oxygen  and NIV at night, 2 liters   pt uses [facility] for oxygen  supplies  [provider]     Critical care time: 40 minutes     Inge Lecher, AGACNP-BC [facility] Prefer epic messenger for cross cover needs If after hours, please call E-link          [1]  Allergies Allergen Reactions   Naproxen Rash and Other (See Comments)    Spouse confirmed that pt breaks out in hives head to toe.    Sulfa Antibiotics Hives and Rash    Sulfa products.    "

## 2024-11-12 NOTE — Progress Notes (Signed)
 OT Cancellation Note  Patient Details Name: Rachael Blankenship MRN: [id number] DOB: 01-27-1956   Cancelled Treatment:    Reason Eval/Treat Not Completed: Other (comment). Per PT, patient had goals of care conversation with the ultimate goal to try and get home with hospice support as soon as her oxygen  levels will allow transport. OT will sign off at this time. Please re-consult if new acute OT needs arise.   Marquisa Salih R., MPH, MS, OTR/L ascom [phone] 11/12/2024, 2:15 PM

## 2024-11-12 NOTE — Progress Notes (Signed)
 PHARMACY CONSULT NOTE  Pharmacy Consult for Electrolyte Monitoring and Replacement   Recent Labs: Potassium (mmol/L)  Date Value  11/12/2024 3.5   Magnesium  (mg/dL)  Date Value  87/80/7974 1.9   Calcium (mg/dL)  Date Value  87/80/7974 9.1   Albumin (g/dL)  Date Value  87/81/7974 3.2 (L)  11/21/2023 4.4   Phosphorus (mg/dL)  Date Value  87/80/7974 3.5   Sodium (mmol/L)  Date Value  11/12/2024 145  11/21/2023 141   Assessment: 68 year old female with a history of smoking and COPD with chronic hypoxic and hypercapnic respiratory failure who presents to the hospital with increased shortness of breath, cough, and wheezing. Pharmacy is asked to follow and replace electrolytes while in CCU.   Goal of Therapy:  Electrolytes WNL  Plan:  --K 3.5, Kcl 40 mEq PO x 1 dose --Mg 1.9, magnesium  sulfate 1 g IV --Phos 3.5, K Phos  Neutral 500 mg PO x 1 dose --Follow-up electrolytes with AM labs tomorrow  Marolyn KATHEE Mare 11/12/2024 8:09 AM

## 2024-11-12 NOTE — Progress Notes (Signed)
 "                                                                                                                                                                                                          Daily Progress Note   Patient Name: Rachael Blankenship       Date: 11/12/2024 DOB: [birth year]  Age: 68 y.o. MRN#: [id number] Attending Physician: Isaiah Scrivener, MD Primary Care Physician: Fernande Ophelia JINNY DOUGLAS, MD Admit Date: 11/04/2024  Reason for Consultation/Follow-up: Establishing goals of care  Patient Profile/HPI:   68 y.o. female  with past medical history of HTN, HFpEF, pulmonary HTN, chronic respiratory failure, wears bipap at night, advanced COPD admitted on 11/04/2024 with COPD exacerbation. Initially required intubation- was extubated on 12/15. Continues on HHFNC at 40LPM. Palliative medicine consulted for goals of care.   Subjective: Chart reviewed including labs, progress notes, imaging from this and previous encounters.  Met with spouse and patient. Feels morphine  has helped her anxiety and shortness of breath. She is very eager to get home.  Discussed with attending team. Working on barriers to discharge home with hospice which include IV precedex  and high flow oxygen . Xanax  has been increased. Discussed using scheduled morphine  so that she doesn't have to wait or ask for it and she agrees.  There was some question about possibly transporting with her home bipap. Case discussed with hospice liaison- [facility] is unable to transport patient on bipap.  PCCM will continue to try and wean her oxygen  to a level where she can transport and can be provided at home.  Was called back later by PCCM- family inquired about other options for life prolonging measures and they discussed possible vent/trach/PEG.  Met with husband outside patient's room. Reviewed that trach/PEG would provide time- but would not reverse patient's lung issues. We also discussed that this would result in prolonged  hospitalization. Quality of life issues were reviewed. Patient is happy to just be on her couch with her puppy so being mobile is not important. However, he does worry that keeping her in the hospital longer is not what she would want. Sam requested that I meet with him and Neve tomorrow to discuss it further. In the mean time they wish to continue to work to wean her oxygen  down.   Review of Systems  Constitutional:  Positive for malaise/fatigue.  Respiratory:  Positive for shortness of breath.      Physical Exam Vitals and nursing note reviewed.  Constitutional:      General: She is not in acute distress.  Pulmonary:     Comments: Increased rate and effort Neurological:     Mental Status: She is alert.             Vital Signs: BP (!) 148/100 (BP Location: Right Arm)   Pulse (!) 122   Temp 98.1 F (36.7 C) (Oral)   Resp (!) 29   Ht 5' 3 (1.6 m)   Wt 64.7 kg   SpO2 94%   BMI 25.27 kg/m  SpO2: SpO2: 94 % O2 Device: O2 Device: Heated High Flow Nasal Cannula O2 Flow Rate: O2 Flow Rate (L/min): 40 L/min  Intake/output summary:  Intake/Output Summary (Last 24 hours) at 11/12/2024 1318 Last data filed at 11/12/2024 1200 Gross per 24 hour  Intake 556.21 ml  Output 600 ml  Net -43.79 ml   LBM: Last BM Date : 11/12/24 Baseline Weight: Weight: 60.8 kg Most recent weight: Weight: 64.7 kg       Palliative Assessment/Data: PPS: 30%      Patient Active Problem List   Diagnosis Date Noted   COPD (chronic obstructive pulmonary disease) (HCC) 11/04/2024   Hypermagnesemia 10/02/2024   Respiratory distress 10/02/2024   COPD exacerbation (HCC) 09/29/2024   Acute on chronic respiratory failure with hypoxia and hypercapnia (HCC) 09/29/2024   Chronic diastolic CHF (congestive heart failure) (HCC) 09/29/2024   Depression with anxiety 09/29/2024   Hypokalemia 09/29/2024   AKI (acute kidney injury) 09/29/2024   Influenza A 12/26/2023   Elevated troponin 12/26/2023   Sepsis  (HCC) 12/26/2023   Streptococcus pneumoniae pneumonia 12/26/2023   Acute on chronic respiratory failure with hypoxia (HCC) 12/21/2023   Acute respiratory failure with hypoxia (HCC) 12/20/2023   Chronic respiratory failure with hypoxia (HCC) 10/08/2023   Primary osteoarthritis involving multiple joints 10/08/2023   Upper abdominal pain 01/02/2023   Generalized abdominal pain 01/02/2023   COPD with acute exacerbation (HCC) 01/21/2022   Erythrocytosis 01/21/2022   Tobacco abuse 01/21/2022   History of COVID-19 12/11/2020   Encounter for general adult medical examination with abnormal findings 09/17/2020   Suprapubic abdominal pain 09/17/2020   Encounter for screening mammogram for malignant neoplasm of breast 09/17/2020   Chronic obstructive pulmonary disease (HCC) 05/12/2020   Age-related osteoporosis without current pathological fracture 04/05/2020   Need for vaccination against Streptococcus pneumoniae using pneumococcal conjugate vaccine 13 03/29/2019   Cigarette smoker 11/26/2018   Obstructive chronic bronchitis without exacerbation (HCC) 08/25/2018   Gastroesophageal reflux disease without esophagitis 08/25/2018   Black stool 08/25/2018   Needs flu shot 08/25/2018   Acute hypoxemic respiratory failure (HCC) 05/21/2018   Screening for osteoporosis 05/13/2018   Acute upper respiratory infection 05/13/2018   Other fatigue 05/13/2018   Generalized anxiety disorder 05/13/2018   Vitamin D  deficiency 05/13/2018   Screening for malignant neoplasm of cervix 05/13/2018   Dysuria 05/13/2018   Chest pain 01/18/2015   Shortness of breath 01/18/2015   Hyperlipemia 03/08/2014   Pulmonary HTN (HCC) 03/08/2014   Reactive airway disease 03/08/2014   Hypertension 06/02/2012    Palliative Care Assessment & Plan    Assessment/Recommendations/Plan  Advanced COPD with severe exacerbation- recovering poorly, continuing on HHF oxygen - primary goal is to get home and not to die in the  hospital Start Morphine  liquid concentrate 5mg  q4 hours, continue prn    Code Status:   Code Status: Limited: Do not attempt resuscitation (DNR) -DNR-LIMITED -Do Not Intubate/DNI    Prognosis:  Unable to determine  Discharge Planning: To Be Determined  Care plan was discussed with  care team, patient and family.   Thank you for allowing the Palliative Medicine Team to assist in the care of this patient.  Total time:  120 minutes Prolonged billing:  Time includes:   Preparing to see the patient (e.g., review of tests) Obtaining and/or reviewing separately obtained history Performing a medically necessary appropriate examination and/or evaluation Counseling and educating the patient/family/caregiver Ordering medications, tests, or procedures Referring and communicating with other health care professionals (when not reported separately) Documenting clinical information in the electronic or other health record Independently interpreting results (not reported separately) and communicating results to the patient/family/caregiver Care coordination (not reported separately) Clinical documentation  Cassondra Stain, AGNP-C Palliative Medicine   Please contact Palliative Medicine Team phone at [phone] for questions and concerns.        "

## 2024-11-12 NOTE — Progress Notes (Signed)
 Nutrition Follow Up Note   DOCUMENTATION CODES:   Not applicable  INTERVENTION:   Ensure Plus High Protein po TID, each supplement provides 350 kcal and 20 grams of protein  Snacks on meal trays  MVI po daily   Vitamin C  500mg  po BID   Thiamine  100mg  po daily x 7 days   Pt remains at high refeed risk; recommend monitor potassium, magnesium  and phosphorus labs daily until stable  Daily weights   NUTRITION DIAGNOSIS:   Inadequate oral intake related to acute illness as evidenced by NPO status. -progressing   GOAL:   Patient will meet greater than or equal to 90% of their needs -not met   MONITOR:   PO intake, Supplement acceptance, Labs, Weight trends, Skin, I & O's  ASSESSMENT:   68 y/o female with h/o COPD, anxiety, depression, GERD, pulmonary hypertension, HLD, CHF and kidney stones who is admitted with COPD exacerbation.  Visited pt's room today. Family at bedside. Pt with fair appetite and oral intake in hospital. Pt continues to have taste changes and early satiety. Pt has been eating mainly soft foods such as ice cream and applesauce. Pt has been drinking two Ensure daily. SLP following. Family has decided to go home with hospice. RD discussed with family today the option to place pt on a regular diet for pleasure with the understanding that pt may aspirate. Family does not want to liberalize pt's diet. They would like to wait for SLP to evaluate prior to diet advancement. RD will add snacks to meal trays per family request. Per chart, pt is up ~4lbs since admit. Pt +1.9L on her I & Os.   Medications reviewed and include: vitamin C , aspirin ,  lovenox , insulin , solu-medrol , protonix , miralax , MVI, thiamine   Labs reviewed: Na 145 wnl, K 3.5 wnl, BUN 36(H), P 3.5 wnl, Mg 1.9 wnl Hgb 10.2(L), Hct 33.7(L) Cbgs- 155, 130, 97 x 24 hrs   UOP-   Diet Order:   Diet Order             DIET - DYS 1 Room service appropriate? Yes with Assist; Fluid consistency: Thin   Diet effective now                  EDUCATION NEEDS:   No education needs have been identified at this time  Skin:  Skin Assessment: Reviewed RN Assessment (ecchymosis)  Last BM:  12/19- type 6  Height:   Ht Readings from Last 1 Encounters:  11/05/24 5' 3 (1.6 m)    Weight:   Wt Readings from Last 1 Encounters:  11/12/24 64.7 kg    Ideal Body Weight:  52.3 kg  BMI:  Body mass index is 25.27 kg/m.  Estimated Nutritional Needs:   Kcal:  1600-1800kcal/day  Protein:  80-90g/day  Fluid:  1.4-1.6L/day  Augustin Shams MS, RD, LDN If unable to be reached, please send secure chat to RD inpatient available from 8:00a-4:00p daily

## 2024-11-12 NOTE — Progress Notes (Signed)
 PT Cancellation Note  Patient Details Name: APPLE DEARMAS MRN: [id number] DOB: 1956-04-27   Cancelled Treatment:    Reason Eval/Treat Not Completed: Other (comment) (Patient had goals of care conversation with the ultimate goal to try and get home with hospice support as soon as her oxygen  levels will allow transport. Discussed with nurse, PT will sign off at this time.)  Randine Essex, PT, MPT  Randine LULLA Essex 11/12/2024, 1:18 PM

## 2024-11-12 NOTE — Progress Notes (Signed)
 [facility] Liaison Note  Follow up on new referral for hospice at home.  Patient with no plans to discharge today.  Patient does want to die at home, per palliative.  Patient on high flow oxygen .  Patient will need DME switched out if patient discharges home.  Patient will also need to be stable for transport.  Will re-evaluate tomorrow.  Continued collaboration and discussions with ICU team, palliative medicine and hospital case manager.  Please do not hesitate to call with any hospice related questions or concerns.  Saddie HILARIO Na, RN Nurse Liaison [phone]

## 2024-11-12 NOTE — Plan of Care (Signed)

## 2024-11-13 DIAGNOSIS — E876 Hypokalemia: Secondary | ICD-10-CM

## 2024-11-13 LAB — GLUCOSE, CAPILLARY
Glucose-Capillary: 87 mg/dL (ref 70–99)
Glucose-Capillary: 121 mg/dL — ABNORMAL HIGH (ref 70–99)
Glucose-Capillary: 163 mg/dL — ABNORMAL HIGH (ref 70–99)
Glucose-Capillary: 214 mg/dL — ABNORMAL HIGH (ref 70–99)
Glucose-Capillary: 195 mg/dL — ABNORMAL HIGH (ref 70–99)
Glucose-Capillary: 157 mg/dL — ABNORMAL HIGH (ref 70–99)

## 2024-11-13 LAB — POTASSIUM: Potassium: 4.1 mmol/L (ref 3.5–5.1)

## 2024-11-13 LAB — PHOSPHORUS: Phosphorus: 2.6 mg/dL (ref 2.5–4.6)

## 2024-11-13 LAB — CBC
HCT: 30.9 % — ABNORMAL LOW (ref 36.0–46.0)
Hemoglobin: 9.4 g/dL — ABNORMAL LOW (ref 12.0–15.0)
MCH: 27 pg (ref 26.0–34.0)
MCHC: 30.4 g/dL (ref 30.0–36.0)
MCV: 88.8 fL (ref 80.0–100.0)
Platelets: 317 K/uL (ref 150–400)
RBC: 3.48 MIL/uL — ABNORMAL LOW (ref 3.87–5.11)
RDW: 14.9 % (ref 11.5–15.5)
WBC: 10 K/uL (ref 4.0–10.5)
nRBC: 0 % (ref 0.0–0.2)

## 2024-11-13 LAB — MAGNESIUM: Magnesium: 1.9 mg/dL (ref 1.7–2.4)

## 2024-11-13 LAB — BASIC METABOLIC PANEL WITH GFR
Anion gap: 7 (ref 5–15)
BUN: 21 mg/dL (ref 8–23)
CO2: 38 mmol/L — ABNORMAL HIGH (ref 22–32)
Calcium: 8.7 mg/dL — ABNORMAL LOW (ref 8.9–10.3)
Chloride: 98 mmol/L (ref 98–111)
Creatinine, Ser: 0.38 mg/dL — ABNORMAL LOW (ref 0.44–1.00)
GFR, Estimated: >60 mL/min
Glucose, Bld: 83 mg/dL (ref 70–99)
Potassium: 2.6 mmol/L — CL (ref 3.5–5.1)
Sodium: 143 mmol/L (ref 135–145)

## 2024-11-13 MED ORDER — POTASSIUM CHLORIDE 20 MEQ PO PACK
60.0000 meq | PACK | Freq: Once | ORAL | Status: AC
  Administered 2024-11-13: 60 meq via ORAL

## 2024-11-13 MED ORDER — ORAL CARE MOUTH RINSE: 15.0000 mL | OROMUCOSAL | Status: DC | PRN

## 2024-11-13 MED ORDER — POTASSIUM CHLORIDE 10 MEQ/100ML IV SOLN
10.0000 meq | INTRAVENOUS | Status: DC
  Administered 2024-11-13: 10 meq via INTRAVENOUS
  Filled 2024-11-13 (×3): qty 100

## 2024-11-13 MED ORDER — POTASSIUM CHLORIDE CRYS ER 20 MEQ PO TBCR
40.0000 meq | EXTENDED_RELEASE_TABLET | Freq: Once | ORAL | Status: DC
  Filled 2024-11-13: qty 2

## 2024-11-13 MED ORDER — ORAL CARE MOUTH RINSE
15.0000 mL | OROMUCOSAL | Status: DC
  Administered 2024-11-14: 15 mL via OROMUCOSAL

## 2024-11-13 NOTE — Progress Notes (Signed)
 PHARMACY CONSULT NOTE  Pharmacy Consult for Electrolyte Monitoring and Replacement   Recent Labs: Potassium (mmol/L)  Date Value  11/13/2024 2.6 (LL)   Magnesium  (mg/dL)  Date Value  87/79/7974 1.9   Calcium (mg/dL)  Date Value  87/79/7974 8.7 (L)   Albumin (g/dL)  Date Value  87/81/7974 3.2 (L)  11/21/2023 4.4   Phosphorus (mg/dL)  Date Value  87/79/7974 2.6   Sodium (mmol/L)  Date Value  11/13/2024 143  11/21/2023 141   Assessment: 68 year old female with a history of smoking and COPD with chronic hypoxic and hypercapnic respiratory failure who presents to the hospital with increased shortness of breath, cough, and wheezing. Pharmacy is asked to follow and replace electrolytes while in CCU.   Goal of Therapy:  Electrolytes WNL  Plan:  --KCl 40 mEq PO x 1 dose --IV KCl 10 mEq x 3 --Follow-up electrolytes with AM labs tomorrow  Adriana JONETTA Bolster 11/13/2024 7:04 AM

## 2024-11-13 NOTE — Progress Notes (Signed)
 "                                                                                                                                                                                                          Daily Progress Note   Patient Name: Rachael Blankenship       Date: 11/13/2024 DOB: [birth year]  Age: 68 y.o. MRN#: [id number] Attending Physician: Isaiah Scrivener, MD Primary Care Physician: Fernande Ophelia JINNY DOUGLAS, MD Admit Date: 11/04/2024  Reason for Consultation/Follow-up: Establishing goals of care  Patient Profile/HPI:   68 y.o. female  with past medical history of HTN, HFpEF, pulmonary HTN, chronic respiratory failure, wears bipap at night, advanced COPD admitted on 11/04/2024 with COPD exacerbation. Initially required intubation- was extubated on 12/15. Continues on HHFNC at 40LPM. Palliative medicine consulted for goals of care.   Subjective: Chart reviewed including labs, progress notes, imaging from this and previous encounters.  Case discussed with Dr. Isaiah.  On evaluation- patient reports feeling better today.  Has been transitioned to regular nasal cannula- on 9L.  Dr. Isaiah at bedside discussing options with patient and spouse.  Patient continues to desire to discharge home as soon as possible with hospice support.  Emotional support provided.   Review of Systems  Constitutional:  Positive for malaise/fatigue.  Respiratory:  Positive for shortness of breath.      Physical Exam Vitals and nursing note reviewed.  Constitutional:      General: She is not in acute distress. Pulmonary:     Comments: Increased rate and effort Neurological:     Mental Status: She is alert.             Vital Signs: BP (!) 166/65   Pulse (!) 116   Temp 98 F (36.7 C) (Axillary)   Resp (!) 27   Ht 5' 3 (1.6 m)   Wt 65.4 kg   SpO2 91%   BMI 25.54 kg/m  SpO2: SpO2: 91 % O2 Device: O2 Device: Heated High Flow Nasal Cannula O2 Flow Rate: O2 Flow Rate (L/min): 40 L/min  Intake/output summary:   Intake/Output Summary (Last 24 hours) at 11/13/2024 1348 Last data filed at 11/13/2024 1110 Gross per 24 hour  Intake 618.86 ml  Output 605 ml  Net 13.86 ml   LBM: Last BM Date : 11/12/24 Baseline Weight: Weight: 60.8 kg Most recent weight: Weight: 65.4 kg       Palliative Assessment/Data: PPS: 30%      Patient Active Problem List   Diagnosis Date Noted   COPD (chronic obstructive  pulmonary disease) (HCC) 11/04/2024   Hypermagnesemia 10/02/2024   Respiratory distress 10/02/2024   COPD exacerbation (HCC) 09/29/2024   Acute on chronic respiratory failure with hypoxia and hypercapnia (HCC) 09/29/2024   Chronic diastolic CHF (congestive heart failure) (HCC) 09/29/2024   Depression with anxiety 09/29/2024   Hypokalemia 09/29/2024   AKI (acute kidney injury) 09/29/2024   Influenza A 12/26/2023   Elevated troponin 12/26/2023   Sepsis (HCC) 12/26/2023   Streptococcus pneumoniae pneumonia 12/26/2023   Acute on chronic respiratory failure with hypoxia (HCC) 12/21/2023   Acute respiratory failure with hypoxia (HCC) 12/20/2023   Chronic respiratory failure with hypoxia (HCC) 10/08/2023   Primary osteoarthritis involving multiple joints 10/08/2023   Upper abdominal pain 01/02/2023   Generalized abdominal pain 01/02/2023   COPD with acute exacerbation (HCC) 01/21/2022   Erythrocytosis 01/21/2022   Tobacco abuse 01/21/2022   History of COVID-19 12/11/2020   Encounter for general adult medical examination with abnormal findings 09/17/2020   Suprapubic abdominal pain 09/17/2020   Encounter for screening mammogram for malignant neoplasm of breast 09/17/2020   Chronic obstructive pulmonary disease (HCC) 05/12/2020   Age-related osteoporosis without current pathological fracture 04/05/2020   Need for vaccination against Streptococcus pneumoniae using pneumococcal conjugate vaccine 13 03/29/2019   Cigarette smoker 11/26/2018   Obstructive chronic bronchitis without exacerbation (HCC)  08/25/2018   Gastroesophageal reflux disease without esophagitis 08/25/2018   Black stool 08/25/2018   Needs flu shot 08/25/2018   Acute hypoxemic respiratory failure (HCC) 05/21/2018   Screening for osteoporosis 05/13/2018   Acute upper respiratory infection 05/13/2018   Other fatigue 05/13/2018   Generalized anxiety disorder 05/13/2018   Vitamin D  deficiency 05/13/2018   Screening for malignant neoplasm of cervix 05/13/2018   Dysuria 05/13/2018   Chest pain 01/18/2015   Shortness of breath 01/18/2015   Hyperlipemia 03/08/2014   Pulmonary HTN (HCC) 03/08/2014   Reactive airway disease 03/08/2014   Hypertension 06/02/2012    Palliative Care Assessment & Plan    Assessment/Recommendations/Plan  Advanced COPD with severe exacerbation- recovering poorly, goal is to discharge home with hospice Discussed with hospice liaison Saddie Na- she will reach out to spouse to begin arrangements Continue Morphine  liquid concentrate 5mg  q4 hours scheduled, continue every 2 hours prn- recommend giving as premed before ADLs  Code Status:   Code Status: Limited: Do not attempt resuscitation (DNR) -DNR-LIMITED -Do Not Intubate/DNI    Prognosis:  Unable to determine  Discharge Planning: To Be Determined  Care plan was discussed with care team, patient and family.   Thank you for allowing the Palliative Medicine Team to assist in the care of this patient.  Total time:  60 mins Prolonged billing:  Time includes:   Preparing to see the patient (e.g., review of tests) Obtaining and/or reviewing separately obtained history Performing a medically necessary appropriate examination and/or evaluation Counseling and educating the patient/family/caregiver Ordering medications, tests, or procedures Referring and communicating with other health care professionals (when not reported separately) Documenting clinical information in the electronic or other health record Independently interpreting  results (not reported separately) and communicating results to the patient/family/caregiver Care coordination (not reported separately) Clinical documentation  Cassondra Stain, AGNP-C Palliative Medicine   Please contact Palliative Medicine Team phone at [phone] for questions and concerns.        "

## 2024-11-13 NOTE — Progress Notes (Addendum)
 "  NAME:  Rachael Blankenship, MRN:  [id number], DOB:  03/20/1956, LOS: 9 ADMISSION DATE:  11/04/2024, CONSULTATION DATE:  11/05/2024 CHIEF COMPLAINT:  Respiratory Failure    Brief Pt Description / Synopsis:  68 y.o female admitted with Acute on Chronic Hypoxic and Hypercapnic Respiratory Failure due to severe Acute COPD Exacerbation, failed trial of BiPAP requiring intubation and mechanical ventilation.   History of Present Illness:  Patient is a 68 year old female with a history of smoking and COPD with chronic hypoxic and hypercapnic respiratory failure who presents to the hospital with increased shortness of breath, cough, and wheezing consistent with a COPD exacerbation.   Patient reports symptom onset around 7 days ago with worsening shortness of breath, cough, wheezing, and increased exertional dyspnea.  She reports having increased oxygen  requirements and inability to walk or move short distances without having significant desaturation.  This further worsened over this week and she was unable to wait until her scheduled appointment with her primary pulmonologist prompting her to present to the ED.   EMS was activated and she was brought in with hypoxia and increased work of breathing.  She received IV steroids and was started on BiPAP for respiratory distress and admitted to the medicine service for further workup and management.  Patient started on IV Solu-Medrol , doxycycline , and received a dose of acetazolamide .  Given lack of improvement today, pulmonary and critical care medicine were consulted for increased risk of respiratory failure.   Medical chart reviewed, patient had a history of smoking and she quit 1 year ago.  Patient has been followed by outpatient pulmonary for severe COPD with her last FEV1 at 0.61 L (29% predicted).  She has chronic hypoxia for which she was started on supplemental oxygen .   Patient was admitted to the hospital in January 2025 with acute on chronic hypoxic  respiratory failure with ICU admission.  She was continued on nocturnal BiPAP after send admission and switched to triple therapy with Breztri  and Ensifentrine  was considered.  She was started on Dupixent to reduce frequency of COPD exacerbations early in 2025.  She was further referred to cardiology for consideration of a right heart catheterization for workup of pulmonary hypertension.   Right heart cath 04/21/2024 showed RA 10/6, PA 40/12 (24), and PCWP of 20 not indicative of pulmonary hypertension.   CT chest ruled out PE in October 2025 after an exacerbation.  This was notable for unchanged complete collapse of the right middle lobe with atelectasis in the lingula.  She then underwent bronchoscopy with BAL of the right middle lobe with cultures returning negative.   Patient then admitted in November 2025 with COPD exacerbation treated with antibiotics and discharged on levofloxacin  as well as a prednisone  taper.  Patient seen by her pulmonologist in early November where her prednisone  taper was extended (taper by 5 mg every 5 days).  She was also started on thrice weekly azithromycin .  [facility] asked to admit for further workup and treatment.  Please see Significant Hospital Events section below for full detailed hospital course.   Pertinent  Medical History   Past Medical History:  Diagnosis Date   Bruises easily    Chronic respiratory failure with hypoxia (HCC)    Cigarette smoker    Collapse of right lung    COPD (chronic obstructive pulmonary disease) (HCC)    COVID-19 07/2024   Dependence on supplemental oxygen     Depression with anxiety    Endometriosis    GERD (gastroesophageal reflux disease)  Grade I diastolic dysfunction    History of sepsis    Hyperlipidemia    Hypertension 06/02/2012   Kidney stones    Pulmonary HTN (HCC)    Reactive airway disease 03/08/2014   Vitamin D  deficiency     Micro Data:  12/11: COVID/FLU/RSV PCR>> negative 12/11; Blood cultures x2>>  no growth  12/12: RVP>> negative  12/12: Strep pneumo urinary antigen>> negative  12/12: Legionella urinary antigen>> negative 12/12: MRSA PCR>>negative  12/13: Tracheal aspirate>> rare candida albicans  Antimicrobials:   Anti-infectives (From admission, onward)    Start     Dose/Rate Route Frequency Ordered Stop   11/07/24 1400  piperacillin -tazobactam (ZOSYN ) IVPB 3.375 g        3.375 g 12.5 mL/hr over 240 Minutes Intravenous Every 8 hours 11/07/24 1026 11/11/24 2359   11/05/24 1430  ceFEPIme  (MAXIPIME ) 2 g in sodium chloride  0.9 % 100 mL IVPB  Status:  Discontinued        2 g 200 mL/hr over 30 Minutes Intravenous Every 12 hours 11/05/24 1322 11/07/24 1009   11/04/24 2315  doxycycline  (VIBRAMYCIN ) 100 mg in sodium chloride  0.9 % 250 mL IVPB  Status:  Discontinued        100 mg 125 mL/hr over 120 Minutes Intravenous 2 times daily 11/04/24 2300 11/09/24 1056   11/04/24 1445  doxycycline  (VIBRA -TABS) tablet 100 mg  Status:  Discontinued        100 mg Oral Every 12 hours 11/04/24 1432 11/04/24 2304   11/04/24 1130  cefTRIAXone  (ROCEPHIN ) 2 g in sodium chloride  0.9 % 100 mL IVPB        2 g 200 mL/hr over 30 Minutes Intravenous Once 11/04/24 1121 11/04/24 1210   11/04/24 1130  azithromycin  (ZITHROMAX ) 500 mg in sodium chloride  0.9 % 250 mL IVPB        500 mg 250 mL/hr over 60 Minutes Intravenous  Once 11/04/24 1121 11/04/24 1314       Significant Hospital Events: Including procedures, antibiotic start and stop dates in addition to other pertinent events   11/04/24: Admitted with COPD exacerbation, on BiPAP 11/05/24: Consult to critical care for respiratory failure and risk of decompensation and need for intubation and mechanical ventilation. Intubated at night 11/06/24: On dexmedetomidine  and fentanyl  for analgesia and sedation. Synchronous with the vent. Family updated at bedside at length 11/07/24: WUA this AM, agitated on dexmedetomidine  (prop/fentanyl  dc'd). Attempt SBT but  failed due to sever anxiety 11/08/24; No significant events overnight. Afebrile, hemodynamically stable, off Levophed .  On minimal vent support, plan for WUA and SBT utilizing Precedex .  EXTUBATED to HFNC, respiratory status remains tenuous. 11/09/24: Extubated yesterday, Patient on HHFNC 30% 45 l/m, Patient states breathing is fine. Some somewhat breathless when trying to speak. Rr upper 20s. Per nurse patient gets anxious and pulls at things. On 1 precedex . Code status changed to DNR (ok with intubation).  Passed swallow evaluation. 11/10/24: No acute events overnight.  Afebrile, not requiring Levophed .  Tolerated BiPaP at bedtime, remains on Precedex  (weaning down), respiratory status slowly improving, remains on HHFNC. 11/11/24: No acute events overnight, tolerated BiPAP with Precedex , requesting to go back to HFNC.  Attempt to wean off Precedex  today, will start Clonidine  to assist with weaning.  Transition IV to PO Valium .  Decrease solumedrol to 20 mg daily.  Diurese with 20 mg Lasix  x1 dose. Consult Palliative Care to assist with GOC discussions. Code status changed to DNR/DNI, goal to discharge home with hospice.  11/12/24: No acute events  overnight. Afebrile, hemodynamically stable.  Tolerated BiPAP overnight, remains on Precedex , now on HHFNC but respiratory status remains tenuous.  Diurese with 40 mg IV lasix  x1 dose.  Family wanting to take pt home with hospice.  11/13/24: Ate breakfast. Plan is home with hospice  Interim History / Subjective:  No acute issues overnight. Awaiting palliative care plan regarding home hospice.  We spoke with husband at length regarding her care options.  Patient is already on an noninvasive vent at home.  Oral intake remains poor.  She still gets severely dyspneic with minimal exertion.  Overall she is at her baseline.  Patient is off Precedex .  ROS Review of Systems  Constitutional:  Positive for malaise/fatigue. Negative for chills and fever.  Respiratory:   Positive for cough, sputum production, shortness of breath and wheezing.   Cardiovascular:  Negative for chest pain and palpitations.  Gastrointestinal:  Negative for heartburn, nausea and vomiting.  Musculoskeletal:  Negative for myalgias.  Neurological:  Negative for dizziness and headaches.     Objective   Blood pressure (!) 131/100, pulse (!) 105, temperature 98 F (36.7 C), temperature source Axillary, resp. rate (!) 25, height 5' 3 (1.6 m), weight 65.4 kg, SpO2 91%.    FiO2 (%):  [30 %-35 %] 35 % PEEP:  [5 cmH20] 5 cmH20 Pressure Support:  [6 cmH20] 6 cmH20   Intake/Output Summary (Last 24 hours) at 11/13/2024 1005 Last data filed at 11/13/2024 0600 Gross per 24 hour  Intake 484.4 ml  Output 180 ml  Net 304.4 ml   Filed Weights   11/11/24 0500 11/12/24 0702 11/13/24 0500  Weight: 63.1 kg 64.7 kg 65.4 kg    Examination: General: Acute ill appearing female, laying in bed, on HHFNC, on Precedex , in NAD HENT: Atraumatic, normocephalic, neck supple, no JVD Lungs: Increased work of breathing, bilateral breath sounds diminished in all lung fields, diffuse expiratory wheezes in all lung fields  Cardiovascular: Mildly tachycardic with heart rate ranging between 90 and 115 bpm, regular rhythm, s1s2, no M/R/G Abdomen: Soft, nontender, nondistended, no guarding or rebound tenderness, BS+ x4 Extremities: Normal bulk and tone, no deformities, no edema, good peripheral perfusion Neuro: Awake, alert and mildly anxious, following commands, no focal deficits noted, PERRL, extraocular eye movements intact GU: External catheter in place and draining clear urine  Resolved Hospital Problem list     Assessment & Plan:   #Acute on Chronic Hypoxic & Hypercapnic Respiratory Failure #COPD Exacerbation #Chronic Atelectasis of RML PMHx: COPD on Breztri  and Dupilumab EXTUBATED 12/15 -Supplemental O2 as needed to maintain O2 sats 88 To 92% -BiPAP at bedtime and PRN for dyspnea and comfort -  Chest x-ray and ABG as needed -Bronchodilators & Pulmicort  nebs - Continue methylprednisone 20 mg daily - Antibiotic course completed -Furosemide  as needed for worsening respiratory status - Plan is for patient to go home on home hospice.  She is already on a noninvasive ventilator at home.  #Hypertension PMHx: HFpEF, HLD Echo 11/2023: Grade I diastolic dysfunction -Continuous cardiac monitoring -Maintain MAP >65 -Cautious IV fluids -Diuresis as BP and renal function permits -Continue Clonidine  and as needed hydralazine  for blood pressure control   #Hypokalemia -K replaced. Repeat K 4.1 - Continue to monitor and replace electrolytes  #Anemia -Monitor for S/Sx of bleeding -Trend CBC -Lovenox  for VTE Prophylaxis  -Transfuse for Hgb <7  #Acute Metabolic Encephalopathy PMHx: Anxiety/depression -Treatment of metabolic derangements as outlined above -Provide supportive care -Promote normal sleep/wake cycle and family presence -Avoid sedating medications as  able -Continue Precedex  as needed ~ attempt to wean off 12/18 ~ continue Clonidine  to assist with weaning  -Increase Valium  to 5 mg q8h  -Continue morphine  for air hunger  -Continue Wellbutrin  and Cymbalta    Best Practice (right click and Reselect all SmartList Selections daily)   Diet/type: Regular  DVT prophylaxis: LMWH GI prophylaxis: PPI Lines: N/A Foley:  N/A Code Status:  DNR/DNI Last date of multidisciplinary goals of care discussion 11/13/24.  Spoke at length with patient and patient's husband at bedside regarding her goals of care as well as plan for discharge.  She is already on a noninvasive ventilator at home.  She is on intermittent BiPAP and high flow here.  It appears this is patient's new baseline and any attempts to wean her of supplemental oxygen  will be futile.  Both patient and husband understand that patient is at the end stage of her disease process.  They have agreed for home discharge with hospice  with the goal of ensuring that patient is comfortable.  She is currently a DNR/DNI but family is okay with medical management and interventions such as BiPAP.  12/20: Updated pt's husband and pt's sister at bedside on plan of care.  Labs   CBC: Recent Labs  Lab 11/09/24 0335 11/10/24 0509 11/11/24 0544 11/12/24 0345 11/13/24 0334  WBC 13.3* 10.9* 9.9 9.1 10.0  HGB 9.5* 9.5* 9.7* 10.2* 9.4*  HCT 32.5* 30.9* 31.8* 33.7* 30.9*  MCV 93.1 90.1 88.8 90.1 88.8  PLT 373 317 317 314 317    Basic Metabolic Panel: Recent Labs  Lab 11/09/24 0335 11/10/24 0509 11/11/24 0544 11/12/24 0345 11/13/24 0334  NA 148* 149* 146* 145 143  K 3.9 3.8 3.9 3.5 2.6*  CL 110 113* 106 105 98  CO2 29 29 32 33* 38*  GLUCOSE 153* 142* 102* 111* 83  BUN 51* 52* 40* 36* 21  CREATININE 0.68 0.57 0.54 0.51 0.38*  CALCIUM 9.3 9.4 9.2 9.1 8.7*  MG 2.5* 2.3 2.1 1.9 1.9  PHOS 3.5 3.2 3.5 3.5 2.6   GFR: Estimated Creatinine Clearance: 59.5 mL/min (A) (by C-G formula based on SCr of 0.38 mg/dL (L)). Recent Labs  Lab 11/10/24 0509 11/11/24 0544 11/12/24 0345 11/13/24 0334  WBC 10.9* 9.9 9.1 10.0    Liver Function Tests: Recent Labs  Lab 11/07/24 0515 11/08/24 0315 11/11/24 0544  AST  --   --  23  ALT  --   --  32  ALKPHOS  --   --  87  BILITOT  --   --  0.3  PROT  --   --  5.8*  ALBUMIN 3.3* 3.3* 3.2*   No results for input(s): LIPASE, AMYLASE in the last 168 hours. No results for input(s): AMMONIA in the last 168 hours.  ABG    Component Value Date/Time   PHART 7.28 (L) 11/06/2024 0045   PCO2ART 69 (HH) 11/06/2024 0045   PO2ART 98 11/06/2024 0045   HCO3 32.4 (H) 11/06/2024 0045   O2SAT 98.8 11/06/2024 0045     Coagulation Profile: No results for input(s): INR, PROTIME in the last 168 hours.  Cardiac Enzymes: No results for input(s): CKTOTAL, CKMB, CKMBINDEX, TROPONINI in the last 168 hours.  HbA1C: Hemoglobin A1C  Date/Time Value Ref Range Status   12/11/2023 04:01 PM 6.2 (A) 4.0 - 5.6 % Final   Hgb A1c MFr Bld  Date/Time Value Ref Range Status  05/22/2018 12:02 AM 6.2 (H) 4.8 - 5.6 % Final    Comment:    (  NOTE) Pre diabetes:          5.7%-6.4% Diabetes:              >6.4% Glycemic control for   <7.0% adults with diabetes     CBG: Recent Labs  Lab 11/12/24 1548 11/12/24 1931 11/12/24 2302 11/13/24 0337 11/13/24 0743  GLUCAP 193* 160* 156* 87 121*    Review of Systems:   Positives in BOLD: Gen: Denies fever, chills, weight change, fatigue, night sweats, anxiety  HEENT: Denies blurred vision, double vision, hearing loss, tinnitus, sinus congestion, rhinorrhea, sore throat, neck stiffness, dysphagia PULM: Reports shortness of breath, mild cough, sputum production, and wheezing but denies hemoptysis CV: Denies chest pain, edema, paroxysmal nocturnal dyspnea, palpitations but reports orthopnea GI: Denies abdominal pain, nausea, vomiting, diarrhea, hematochezia, melena, constipation, change in bowel habits GU: Denies dysuria, hematuria, polyuria, oliguria, urethral discharge Endocrine: Denies hot or cold intolerance, polyuria, polyphagia or appetite change Derm: Denies rash, dry skin, scaling or peeling skin change Heme: Denies easy bruising, bleeding, bleeding gums Neuro: Denies headache, numbness, weakness, slurred speech, loss of memory or consciousness Past Medical History:  She,  has a past medical history of Bruises easily, Chronic respiratory failure with hypoxia (HCC), Cigarette smoker, Collapse of right lung, COPD (chronic obstructive pulmonary disease) (HCC), COVID-19 (07/2024), Dependence on supplemental oxygen , Depression with anxiety, Endometriosis, GERD (gastroesophageal reflux disease), Grade I diastolic dysfunction, History of sepsis, Hyperlipidemia, Hypertension (06/02/2012), Kidney stones, Pulmonary HTN (HCC), Reactive airway disease (03/08/2014), and Vitamin D  deficiency.   Surgical History:   Past  Surgical History:  Procedure Laterality Date   BRONCHIAL WASHINGS N/A 09/17/2024   Procedure: IRRIGATION, BRONCHUS;  Surgeon: Parris Manna, MD;  Location: [facility] ORS;  Service: Thoracic;  Laterality: N/A;   CATARACT EXTRACTION W/PHACO Right 12/04/2023   Procedure: CATARACT EXTRACTION PHACO AND INTRAOCULAR LENS PLACEMENT (IOC) RIGHT  CLAREON VIVITY LENS;  Surgeon: Enola [name], MD;  Location: [facility];  Service: Ophthalmology;  Laterality: Right;  10.40 1:04.0   CATARACT EXTRACTION W/PHACO Left 12/18/2023   Procedure: CATARACT EXTRACTION PHACO AND INTRAOCULAR LENS PLACEMENT (IOC) LEFT  CLAREON VIVITY TORIC 16.28, 01:19.2;  Surgeon: Enola [name], MD;  Location: [facility];  Service: Ophthalmology;  Laterality: Left;   COLONOSCOPY WITH PROPOFOL  N/A 01/02/2023   Procedure: COLONOSCOPY WITH PROPOFOL ;  Surgeon: Unk Corinn Skiff, MD;  Location: [facility] END[facility];  Service: Gastroenterology;  Laterality: N/A;   ESOPHAGOGASTRODUODENOSCOPY N/A 07/29/2018   Procedure: ESOPHAGOGASTRODUODENOSCOPY (EGD);  Surgeon: Toledo, Ladell POUR, MD;  Location: [facility] ENDOSCOPY;  Service: Gastroenterology;  Laterality: N/A;   ESOPHAGOGASTRODUODENOSCOPY (EGD) WITH PROPOFOL  N/A 01/02/2023   Procedure: ESOPHAGOGASTRODUODENOSCOPY (EGD) WITH PROPOFOL ;  Surgeon: Unk Corinn Skiff, MD;  Location: [facility] ENDOSCOPY;  Service: Gastroenterology;  Laterality: N/A;   FLEXIBLE BRONCHOSCOPY N/A 09/17/2024   Procedure: BRONCHOSCOPY, FLEXIBLE;  Surgeon: Parris Manna, MD;  Location: [facility];  Service: Thoracic;  Laterality: N/A;   IR RADIOLOGIST EVAL & MGMT  10/19/2024   KIDNEY STONE SURGERY     RIGHT HEART CATH Right 04/21/2024   Procedure: RIGHT HEART CATH;  Surgeon: Ammon Blunt, MD;  Location: [facility] INVASIVE CV LAB;  Service: Cardiovascular;  Laterality: Right;   TONSILLECTOMY       Social History:   reports that she quit smoking about 10 months ago. Her smoking use included cigarettes. She  started smoking about 51 years ago. She has a 26.8 pack-year smoking history. She has never used smokeless tobacco. She reports current alcohol use. She reports that she does  not use drugs.   Family History:  Her family history includes GER disease in her father and mother; Heart attack in her father; Hyperlipidemia in her mother; Hypertension in her father and mother; Parkinson's disease in her father.   Allergies Allergies[1]   Home Medications  Prior to Admission medications  Medication Sig Start Date End Date Taking? Authorizing Provider  ALPRAZolam  (XANAX ) 0.25 MG tablet Take 1 tablet (0.25 mg total) by mouth 3 (three) times daily as needed for anxiety. 12/11/23  Yes Abernathy, Mardy, NP  ascorbic acid  (VITAMIN C ) 500 MG tablet Take 500 mg by mouth daily.   Yes [provider]  aspirin  81 MG tablet Take 81 mg by mouth at bedtime.    Yes [provider]  azithromycin  (ZITHROMAX ) 250 MG tablet Take 250 mg by mouth every other day. 10/06/24 11/14/24 Yes [provider]  BREZTRI  AEROSPHERE 160-9-4.8 MCG/ACT AERO inhaler Inhale 2 puffs into the lungs 2 (two) times daily. 04/12/24  Yes [provider]  buPROPion  (WELLBUTRIN ) 75 MG tablet Take 75 mg by mouth 2 (two) times daily.   Yes [provider]  Cholecalciferol  25 MCG (1000 UT) tablet Take 1,000 Units by mouth daily.    Yes [provider]  Coenzyme Q10 (CO Q-10) 100 MG CAPS Take 100 mg by mouth daily.   Yes [provider]  DENTA 5000 PLUS 1.1 % CREA dental cream Take 1 application  by mouth 2 (two) times daily as needed. 04/14/21  Yes [provider]  dextromethorphan-guaiFENesin  (MUCINEX  DM) 30-600 MG 12hr tablet Take 1 tablet by mouth 2 (two) times daily as needed for cough. 10/02/24  Yes Caleen Qualia, MD  DULoxetine  (CYMBALTA ) 60 MG capsule Take 1 capsule (60 mg total) by mouth daily. 05/19/24  Yes Abernathy, Alyssa, NP  Dupilumab 300 MG/2ML SOAJ Inject 300 mg into  the skin every 14 (fourteen) days. 01/27/24  Yes [provider]  EPINEPHrine  0.3 mg/0.3 mL IJ SOAJ injection Inject 0.3 mg into the muscle as needed. 01/27/24  Yes [provider]  famotidine  (PEPCID ) 40 MG tablet Take 1 tablet (40 mg total) by mouth 2 (two) times daily. 04/05/24  Yes Abernathy, Mardy, NP  fluticasone  (FLONASE ) 50 MCG/ACT nasal spray Place 2 sprays into both nostrils daily.   Yes [provider]  furosemide  (LASIX ) 40 MG tablet TAKE 1 TABLET BY MOUTH DAILY 04/12/24  Yes Abernathy, Alyssa, NP  gemfibrozil  (LOPID ) 600 MG tablet Take 1 tablet (600 mg total) by mouth 2 (two) times daily before a meal. 03/29/24  Yes Abernathy, Alyssa, NP  Ipratropium-Albuterol  (COMBIVENT  RESPIMAT) 20-100 MCG/ACT AERS respimat INHALE 1 PUFF INTO THE LUNGS EVERY 6 HOURS AS NEEDED FOR WHEEZING OR SHORTNESS OF BREATH 10/31/23  Yes Khan, Fozia M, MD  Magnesium  250 MG CAPS Take 250 mg by mouth daily.   Yes [provider]  metoprolol  tartrate (LOPRESSOR ) 25 MG tablet Take 0.5 tablets (12.5 mg total) by mouth 2 (two) times daily. 10/02/24  Yes Amin, Sumayya, MD  Multiple Vitamins-Minerals (MULTIVITAMIN WITH MINERALS) tablet Take 1 tablet by mouth daily. Centrum Silver 50+   Yes [provider]  Potassium Chloride  CR (MICRO-K ) 8 MEQ CPCR capsule CR Take 1 capsule (8 mEq total) by mouth daily. 10/26/23  Yes Khan, Fozia M, MD  predniSONE  (DELTASONE ) 5 MG tablet Take by mouth. Take 9 tablets (45 mg total) by mouth once daily for 5 days, THEN 8 tablets (40 mg total) once daily for 5 days, THEN 7 tablets (35  mg total) once daily for 5 days, THEN 6 tablets (30 mg total) once daily for 5 days, THEN 5 tablets (25 mg total) once daily for 5 days, THEN 4 tablets (20 mg total) once daily for 5 days, THEN 3 tablets (15 mg total) once daily for 5 days, THEN 2 tablets (10 mg total) once daily for 5 days, THEN 1 tablet (5 mg total) once daily for 5 days, THEN 0.5 tablets (2.5 mg total) once daily  for 5 days. 10/06/24 11/25/24 Yes [provider]  sucralfate  (CARAFATE ) 1 g tablet Take 1 g by mouth 4 (four) times daily as needed (Stomach).   Yes [provider]  vitamin E  180 MG (400 UNITS) capsule Take 400 Units by mouth daily.   Yes [provider]  colchicine 0.6 MG tablet Take 0.6 mg by mouth daily. Patient not taking: Reported on 11/04/2024 09/13/24   [provider]  feeding supplement (ENSURE PLUS HIGH PROTEIN) LIQD Take 237 mLs by mouth 2 (two) times daily between meals. 10/03/24   Amin, Sumayya, MD  OXYGEN  Inhale 2 L into the lungs continuous. Continuous oxygen  and NIV at night, 2 liters   pt uses [facility] for oxygen  supplies    [provider]     Critical care time: 50 minutes    Bryelle Spiewak S. Griffin, PhD, MSN, MPH, ANP-BC [facility] Prefer epic messenger for cross cover needs If after hours, please call E-link  NB: This document was prepared using Dragon voice recognition software and may include unintentional dictation errors.             [1]  Allergies Allergen Reactions   Naproxen Rash and Other (See Comments)    Spouse confirmed that pt breaks out in hives head to toe.    Sulfa Antibiotics Hives and Rash    Sulfa products.    "

## 2024-11-13 NOTE — Plan of Care (Addendum)
 This patient remains on AR-ICU/SDU as of time of writing. The patient is somnolent this evening but is in no obvious distress. The patient awakens to light stimulation and answered all questions appropriately. The patient is requiring 9 L / min of supplemental O2 via HFNC. The tentative plan of care for this patient is to discharge home with hospice in the coming days. DNR armband applied to patient overnight per protocol.   Problem: Education: Goal: Knowledge of General Education information will improve Description: Including pain rating scale, medication(s)/side effects and non-pharmacologic comfort measures Outcome: Progressing   Problem: Health Behavior/Discharge Planning: Goal: Ability to manage health-related needs will improve Outcome: Progressing   Problem: Clinical Measurements: Goal: Ability to maintain clinical measurements within normal limits will improve Outcome: Progressing Goal: Will remain free from infection Outcome: Progressing Goal: Diagnostic test results will improve Outcome: Progressing Goal: Respiratory complications will improve Outcome: Progressing Goal: Cardiovascular complication will be avoided Outcome: Progressing   Problem: Activity: Goal: Risk for activity intolerance will decrease Outcome: Progressing   Problem: Nutrition: Goal: Adequate nutrition will be maintained Outcome: Progressing   Problem: Coping: Goal: Level of anxiety will decrease Outcome: Progressing   Problem: Elimination: Goal: Will not experience complications related to bowel motility Outcome: Progressing Goal: Will not experience complications related to urinary retention Outcome: Progressing   Problem: Pain Managment: Goal: General experience of comfort will improve and/or be controlled Outcome: Progressing   Problem: Safety: Goal: Ability to remain free from injury will improve Outcome: Progressing   Problem: Skin Integrity: Goal: Risk for impaired skin integrity  will decrease Outcome: Progressing   Problem: Education: Goal: Knowledge of disease or condition will improve Outcome: Progressing Goal: Knowledge of the prescribed therapeutic regimen will improve Outcome: Progressing Goal: Individualized Educational Video(s) Outcome: Progressing   Problem: Activity: Goal: Ability to tolerate increased activity will improve Outcome: Progressing Goal: Will verbalize the importance of balancing activity with adequate rest periods Outcome: Progressing   Problem: Respiratory: Goal: Ability to maintain a clear airway will improve Outcome: Progressing Goal: Levels of oxygenation will improve Outcome: Progressing Goal: Ability to maintain adequate ventilation will improve Outcome: Progressing   Problem: Education: Goal: Ability to demonstrate management of disease process will improve Outcome: Progressing Goal: Ability to verbalize understanding of medication therapies will improve Outcome: Progressing Goal: Individualized Educational Video(s) Outcome: Progressing   Problem: Activity: Goal: Capacity to carry out activities will improve Outcome: Progressing   Problem: Cardiac: Goal: Ability to achieve and maintain adequate cardiopulmonary perfusion will improve Outcome: Progressing

## 2024-11-13 NOTE — Progress Notes (Signed)
 Pt was placed on a HFNC at approx 1215.  I started out at 6L, however, pt's Sp02 was 84-85%,  Pt is currently on 9L HFNC

## 2024-11-13 NOTE — Progress Notes (Signed)
 Rachael Blankenship Liaison Note  Received request from Rachael Blankenship, Case Manager (CM), for hospice services at home after discharge.  Spoke with Rachael Blankenship, spouse, to initiate education related to hospice philosophy, services, and team approach to care. Rachael Blankenship verbalized understanding of information given. Per discussion, the plan is for discharge home by EMS when patient is medically stable and equipment can be delivered.  DME needs discussed.   Patient has the following equipment in the home:  Bipap/triology, oxygen  concentrator, back up tanks  all through [employer]  Patient/family requests the following equipment for delivery:  DME above will need to be switched out with [facility] equipment company.  Also request hospital bed 2- 1/2 rails, over bed table, BSC, transport wheelchair               The address has been verified and is correct in the chart.  Rachael Blankenship is the family contact to arrange time of equipment delivery.  Please send signed and completed DNR home with patient/family if applicable.   Please provide prescriptions at discharge as needed to ensure ongoing symptom management.  [facility] information and contact numbers given to Rachael Blankenship  Above information shared with Rachael Blankenship, CM and hospital medical care team.  Please call with any hospice related questions or concerns.  Thank you for the opportunity to participate in this patients care.  Rachael HILARIO Na, MA, BSN, RN, FNE Nurse Liaison [phone]

## 2024-11-13 NOTE — Plan of Care (Signed)
  Problem: Education: Goal: Knowledge of General Education information will improve Description: Including pain rating scale, medication(s)/side effects and non-pharmacologic comfort measures Outcome: Progressing   Problem: Health Behavior/Discharge Planning: Goal: Ability to manage health-related needs will improve Outcome: Progressing   Problem: Clinical Measurements: Goal: Ability to maintain clinical measurements within normal limits will improve Outcome: Progressing Goal: Will remain free from infection Outcome: Progressing Goal: Diagnostic test results will improve Outcome: Progressing Goal: Respiratory complications will improve Outcome: Progressing Goal: Cardiovascular complication will be avoided Outcome: Progressing   Problem: Activity: Goal: Risk for activity intolerance will decrease Outcome: Progressing   Problem: Nutrition: Goal: Adequate nutrition will be maintained Outcome: Progressing   Problem: Coping: Goal: Level of anxiety will decrease Outcome: Progressing   Problem: Elimination: Goal: Will not experience complications related to bowel motility Outcome: Progressing Goal: Will not experience complications related to urinary retention Outcome: Progressing   Problem: Pain Managment: Goal: General experience of comfort will improve and/or be controlled Outcome: Progressing   Problem: Safety: Goal: Ability to remain free from injury will improve Outcome: Progressing   Problem: Skin Integrity: Goal: Risk for impaired skin integrity will decrease Outcome: Progressing   Problem: Education: Goal: Knowledge of disease or condition will improve Outcome: Progressing Goal: Knowledge of the prescribed therapeutic regimen will improve Outcome: Progressing   Problem: Activity: Goal: Ability to tolerate increased activity will improve Outcome: Progressing Goal: Will verbalize the importance of balancing activity with adequate rest periods Outcome:  Progressing   Problem: Respiratory: Goal: Ability to maintain a clear airway will improve Outcome: Progressing Goal: Levels of oxygenation will improve Outcome: Progressing Goal: Ability to maintain adequate ventilation will improve Outcome: Progressing

## 2024-11-14 DIAGNOSIS — Z66 Do not resuscitate: Secondary | ICD-10-CM

## 2024-11-14 LAB — BASIC METABOLIC PANEL WITH GFR
Anion gap: 8 (ref 5–15)
BUN: 18 mg/dL (ref 8–23)
CO2: 38 mmol/L — ABNORMAL HIGH (ref 22–32)
Calcium: 9.2 mg/dL (ref 8.9–10.3)
Chloride: 98 mmol/L (ref 98–111)
Creatinine, Ser: 0.37 mg/dL — ABNORMAL LOW (ref 0.44–1.00)
GFR, Estimated: >60 mL/min
Glucose, Bld: 94 mg/dL (ref 70–99)
Potassium: 4.2 mmol/L (ref 3.5–5.1)
Sodium: 143 mmol/L (ref 135–145)

## 2024-11-14 LAB — GLUCOSE, CAPILLARY
Glucose-Capillary: 101 mg/dL — ABNORMAL HIGH (ref 70–99)
Glucose-Capillary: 115 mg/dL — ABNORMAL HIGH (ref 70–99)
Glucose-Capillary: 127 mg/dL — ABNORMAL HIGH (ref 70–99)
Glucose-Capillary: 139 mg/dL — ABNORMAL HIGH (ref 70–99)
Glucose-Capillary: 165 mg/dL — ABNORMAL HIGH (ref 70–99)
Glucose-Capillary: 207 mg/dL — ABNORMAL HIGH (ref 70–99)

## 2024-11-14 LAB — CBC
HCT: 31.8 % — ABNORMAL LOW (ref 36.0–46.0)
Hemoglobin: 9.6 g/dL — ABNORMAL LOW (ref 12.0–15.0)
MCH: 27.8 pg (ref 26.0–34.0)
MCHC: 30.2 g/dL (ref 30.0–36.0)
MCV: 92.2 fL (ref 80.0–100.0)
Platelets: 315 K/uL (ref 150–400)
RBC: 3.45 MIL/uL — ABNORMAL LOW (ref 3.87–5.11)
RDW: 14.8 % (ref 11.5–15.5)
WBC: 9.7 K/uL (ref 4.0–10.5)
nRBC: 0 % (ref 0.0–0.2)

## 2024-11-14 LAB — MAGNESIUM: Magnesium: 2 mg/dL (ref 1.7–2.4)

## 2024-11-14 NOTE — Progress Notes (Signed)
 [facility] Liaison Note  Follow up visit to the bedside to speak with Rahima and her husband.  Last night there had been some discussion of possibly exploring more aggressive treatment like a PEG or a Trach.  They have decided to proceed with hospice and would like to discharge as soon as possible.  Equipment will be ordered in the morning.  Plan for patient to discharge home after DME delivery.  We have set up a 5pm admission visit.  Patient will need to have meds ordered to go home with patient for symptom management needs before hospice admission visit.    Patient will need to be discharged via [facility] prior to 4pm to ensure patient is home.  There are no additional admission visits open for Monday.  I communicated this with the ICU MD, palliative team, ICU RN and medical team.  Hospital Liaison, [facility], will follow up with patient/hospital team tomorrow.  Thank you for allowing participation in this patient's care.  Saddie HILARIO Na, RN Nurse Liaison [phone]

## 2024-11-14 NOTE — Progress Notes (Signed)
 "  NAME:  Rachael Blankenship, MRN:  [id number], DOB:  07-29-1956, LOS: 10 ADMISSION DATE:  11/04/2024, CONSULTATION DATE:  11/05/2024 CHIEF COMPLAINT:  Respiratory Failure    Brief Pt Description / Synopsis:  68 y.o female admitted with Acute on Chronic Hypoxic and Hypercapnic Respiratory Failure due to severe Acute COPD Exacerbation, failed trial of BiPAP requiring intubation and mechanical ventilation.   History of Present Illness:  Patient is a 68 year old female with a history of smoking and COPD with chronic hypoxic and hypercapnic respiratory failure who presents to the hospital with increased shortness of breath, cough, and wheezing consistent with a COPD exacerbation.   Patient reports symptom onset around 7 days ago with worsening shortness of breath, cough, wheezing, and increased exertional dyspnea.  She reports having increased oxygen  requirements and inability to walk or move short distances without having significant desaturation.  This further worsened over this week and she was unable to wait until her scheduled appointment with her primary pulmonologist prompting her to present to the ED.   EMS was activated and she was brought in with hypoxia and increased work of breathing.  She received IV steroids and was started on BiPAP for respiratory distress and admitted to the medicine service for further workup and management.  Patient started on IV Solu-Medrol , doxycycline , and received a dose of acetazolamide .  Given lack of improvement today, pulmonary and critical care medicine were consulted for increased risk of respiratory failure.   Medical chart reviewed, patient had a history of smoking and she quit 1 year ago.  Patient has been followed by outpatient pulmonary for severe COPD with her last FEV1 at 0.61 L (29% predicted).  She has chronic hypoxia for which she was started on supplemental oxygen .   Patient was admitted to the hospital in January 2025 with acute on chronic hypoxic  respiratory failure with ICU admission.  She was continued on nocturnal BiPAP after send admission and switched to triple therapy with Breztri  and Ensifentrine  was considered.  She was started on Dupixent to reduce frequency of COPD exacerbations early in 2025.  She was further referred to cardiology for consideration of a right heart catheterization for workup of pulmonary hypertension.   Right heart cath 04/21/2024 showed RA 10/6, PA 40/12 (24), and PCWP of 20 not indicative of pulmonary hypertension.   CT chest ruled out PE in October 2025 after an exacerbation.  This was notable for unchanged complete collapse of the right middle lobe with atelectasis in the lingula.  She then underwent bronchoscopy with BAL of the right middle lobe with cultures returning negative.   Patient then admitted in November 2025 with COPD exacerbation treated with antibiotics and discharged on levofloxacin  as well as a prednisone  taper.  Patient seen by her pulmonologist in early November where her prednisone  taper was extended (taper by 5 mg every 5 days).  She was also started on thrice weekly azithromycin .  TRH asked to admit for further workup and treatment.  Please see Significant Hospital Events section below for full detailed hospital course.   Pertinent  Medical History   Past Medical History:  Diagnosis Date   Bruises easily    Chronic respiratory failure with hypoxia (HCC)    Cigarette smoker    Collapse of right lung    COPD (chronic obstructive pulmonary disease) (HCC)    COVID-19 07/2024   Dependence on supplemental oxygen     Depression with anxiety    Endometriosis    GERD (gastroesophageal reflux disease)  Grade I diastolic dysfunction    History of sepsis    Hyperlipidemia    Hypertension 06/02/2012   Kidney stones    Pulmonary HTN (HCC)    Reactive airway disease 03/08/2014   Vitamin D  deficiency     Micro Data:  12/11: COVID/FLU/RSV PCR>> negative 12/11; Blood cultures x2>>  no growth  12/12: RVP>> negative  12/12: Strep pneumo urinary antigen>> negative  12/12: Legionella urinary antigen>> negative 12/12: MRSA PCR>>negative  12/13: Tracheal aspirate>> rare candida albicans  Antimicrobials:   Anti-infectives (From admission, onward)    Start     Dose/Rate Route Frequency Ordered Stop   11/07/24 1400  piperacillin -tazobactam (ZOSYN ) IVPB 3.375 g        3.375 g 12.5 mL/hr over 240 Minutes Intravenous Every 8 hours 11/07/24 1026 11/11/24 2359   11/05/24 1430  ceFEPIme  (MAXIPIME ) 2 g in sodium chloride  0.9 % 100 mL IVPB  Status:  Discontinued        2 g 200 mL/hr over 30 Minutes Intravenous Every 12 hours 11/05/24 1322 11/07/24 1009   11/04/24 2315  doxycycline  (VIBRAMYCIN ) 100 mg in sodium chloride  0.9 % 250 mL IVPB  Status:  Discontinued        100 mg 125 mL/hr over 120 Minutes Intravenous 2 times daily 11/04/24 2300 11/09/24 1056   11/04/24 1445  doxycycline  (VIBRA -TABS) tablet 100 mg  Status:  Discontinued        100 mg Oral Every 12 hours 11/04/24 1432 11/04/24 2304   11/04/24 1130  cefTRIAXone  (ROCEPHIN ) 2 g in sodium chloride  0.9 % 100 mL IVPB        2 g 200 mL/hr over 30 Minutes Intravenous Once 11/04/24 1121 11/04/24 1210   11/04/24 1130  azithromycin  (ZITHROMAX ) 500 mg in sodium chloride  0.9 % 250 mL IVPB        500 mg 250 mL/hr over 60 Minutes Intravenous  Once 11/04/24 1121 11/04/24 1314       Significant Hospital Events: Including procedures, antibiotic start and stop dates in addition to other pertinent events   11/04/24: Admitted with COPD exacerbation, on BiPAP 11/05/24: Consult to critical care for respiratory failure and risk of decompensation and need for intubation and mechanical ventilation. Intubated at night 11/06/24: On dexmedetomidine  and fentanyl  for analgesia and sedation. Synchronous with the vent. Family updated at bedside at length 11/07/24: WUA this AM, agitated on dexmedetomidine  (prop/fentanyl  dc'd). Attempt SBT but  failed due to sever anxiety 11/08/24; No significant events overnight. Afebrile, hemodynamically stable, off Levophed .  On minimal vent support, plan for WUA and SBT utilizing Precedex .  EXTUBATED to HFNC, respiratory status remains tenuous. 11/09/24: Extubated yesterday, Patient on HHFNC 30% 45 l/m, Patient states breathing is fine. Some somewhat breathless when trying to speak. Rr upper 20s. Per nurse patient gets anxious and pulls at things. On 1 precedex . Code status changed to DNR (ok with intubation).  Passed swallow evaluation. 11/10/24: No acute events overnight.  Afebrile, not requiring Levophed .  Tolerated BiPaP at bedtime, remains on Precedex  (weaning down), respiratory status slowly improving, remains on HHFNC. 11/11/24: No acute events overnight, tolerated BiPAP with Precedex , requesting to go back to HFNC.  Attempt to wean off Precedex  today, will start Clonidine  to assist with weaning.  Transition IV to PO Valium .  Decrease solumedrol to 20 mg daily.  Diurese with 20 mg Lasix  x1 dose. Consult Palliative Care to assist with GOC discussions. Code status changed to DNR/DNI, goal to discharge home with hospice.  11/12/24: No acute events  overnight. Afebrile, hemodynamically stable.  Tolerated BiPAP overnight, remains on Precedex , now on HHFNC but respiratory status remains tenuous.  Diurese with 40 mg IV lasix  x1 dose.  Family wanting to take pt home with hospice.  11/13/24: Ate breakfast. Plan is home with hospice  Interim History / Subjective:  No acute issues overnight. Awaiting palliative care plan regarding home hospice.  We spoke with husband at length regarding her care options.  Patient is already on an noninvasive vent at home.  Oral intake remains poor.  She still gets severely dyspneic with minimal exertion.  Overall she is at her baseline.  Patient is off Precedex . Tolerating morphine  and valium   ROS Review of Systems  Constitutional:  Positive for malaise/fatigue. Negative for  chills and fever.  Respiratory:  Positive for cough, sputum production, shortness of breath and wheezing.   Cardiovascular:  Negative for chest pain and palpitations.  Gastrointestinal:  Negative for heartburn, nausea and vomiting.  Musculoskeletal:  Negative for myalgias.  Neurological:  Negative for dizziness and headaches.     Objective   Blood pressure (!) 128/59, pulse (!) 103, temperature 98.2 F (36.8 C), temperature source Axillary, resp. rate 17, height 5' 3 (1.6 m), weight 65.2 kg, SpO2 98%.    FiO2 (%):  [35 %-45 %] 45 % PEEP:  [5 cmH20] 5 cmH20 Pressure Support:  [6 cmH20] 6 cmH20   Intake/Output Summary (Last 24 hours) at 11/14/2024 1740 Last data filed at 11/14/2024 1400 Gross per 24 hour  Intake 0 ml  Output 550 ml  Net -550 ml   Filed Weights   11/12/24 0702 11/13/24 0500 11/14/24 0500  Weight: 64.7 kg 65.4 kg 65.2 kg    Examination: General: Acute ill appearing female, laying in bed, on HHFNC, on Precedex , in NAD HENT: Atraumatic, normocephalic, neck supple, no JVD Lungs: Increased work of breathing, bilateral breath sounds diminished in all lung fields, diffuse expiratory wheezes in all lung fields    Assessment & Plan:   #Acute on Chronic Hypoxic & Hypercapnic Respiratory Failure #COPD Exacerbation #Chronic Atelectasis of RML PMHx: COPD on Breztri  and Dupilumab EXTUBATED 12/15 -Supplemental O2 as needed to maintain O2 sats 88 To 92% -BiPAP at bedtime and PRN for dyspnea and comfort - Chest x-ray and ABG as needed -Bronchodilators & Pulmicort  nebs - Continue methylprednisone 20 mg daily - Antibiotic course completed -Furosemide  as needed for worsening respiratory status - Plan is for patient to go home on home hospice.  She is already on a noninvasive ventilator at home.  PMHx: Anxiety/depression -Promote normal sleep/wake cycle and family presence -Avoid sedating medications as able -Continue Precedex  as needed ~ attempt to wean off 12/18 ~  continue Clonidine  to assist with weaning  -Increase Valium  to 5 mg q8h  -Continue morphine  for air hunger  -Continue Wellbutrin  and Cymbalta     ENDO - ICU hypoglycemic\Hyperglycemia protocol -check FSBS per protocol   GI GI PROPHYLAXIS as indicated NUTRITIONAL STATUS DIET-->TF's as tolerated Constipation protocol as indicated   ELECTROLYTES -follow labs as needed -replace as needed -pharmacy consultation and following  RESTRICTIVE TRANSFUSION PROTOCOL TRANSFUSION  IF HGB<7  or ACTIVE BLEEDING OR DX of ACUTE CORONARY SYNDROMES      Best Practice (right click and Reselect all SmartList Selections daily)   Diet/type: Regular  DVT prophylaxis: LMWH GI prophylaxis: PPI Lines: N/A Foley:  N/A Code Status:  DNR/DNI Last date of multidisciplinary goals of care discussion 11/13/24.  Spoke at length with patient and patient's husband at bedside regarding her  goals of care as well as plan for discharge.  She is already on a noninvasive ventilator at home.  She is on intermittent BiPAP and high flow here.  It appears this is patient's new baseline and any attempts to wean her of supplemental oxygen  will be futile.  Both patient and husband understand that patient is at the end stage of her disease process.  They have agreed for home discharge with hospice with the goal of ensuring that patient is comfortable.  She is currently a DNR/DNI but family is okay with medical management and interventions such as BiPAP.  12/20: Updated pt's husband and pt's sister at bedside on plan of care.  Labs   CBC: Recent Labs  Lab 11/10/24 0509 11/11/24 0544 11/12/24 0345 11/13/24 0334 11/14/24 0343  WBC 10.9* 9.9 9.1 10.0 9.7  HGB 9.5* 9.7* 10.2* 9.4* 9.6*  HCT 30.9* 31.8* 33.7* 30.9* 31.8*  MCV 90.1 88.8 90.1 88.8 92.2  PLT 317 317 314 317 315    Basic Metabolic Panel: Recent Labs  Lab 11/09/24 0335 11/10/24 0509 11/11/24 0544 11/12/24 0345 11/13/24 0334 11/13/24 1234  11/14/24 0343  NA 148* 149* 146* 145 143  --  143  K 3.9 3.8 3.9 3.5 2.6* 4.1 4.2  CL 110 113* 106 105 98  --  98  CO2 29 29 32 33* 38*  --  38*  GLUCOSE 153* 142* 102* 111* 83  --  94  BUN 51* 52* 40* 36* 21  --  18  CREATININE 0.68 0.57 0.54 0.51 0.38*  --  0.37*  CALCIUM 9.3 9.4 9.2 9.1 8.7*  --  9.2  MG 2.5* 2.3 2.1 1.9 1.9  --  2.0  PHOS 3.5 3.2 3.5 3.5 2.6  --   --    GFR: Estimated Creatinine Clearance: 59.4 mL/min (A) (by C-G formula based on SCr of 0.37 mg/dL (L)). Recent Labs  Lab 11/11/24 0544 11/12/24 0345 11/13/24 0334 11/14/24 0343  WBC 9.9 9.1 10.0 9.7    Liver Function Tests: Recent Labs  Lab 11/08/24 0315 11/11/24 0544  AST  --  23  ALT  --  32  ALKPHOS  --  87  BILITOT  --  0.3  PROT  --  5.8*  ALBUMIN 3.3* 3.2*   No results for input(s): LIPASE, AMYLASE in the last 168 hours. No results for input(s): AMMONIA in the last 168 hours.  ABG    Component Value Date/Time   PHART 7.28 (L) 11/06/2024 0045   PCO2ART 69 (HH) 11/06/2024 0045   PO2ART 98 11/06/2024 0045   HCO3 32.4 (H) 11/06/2024 0045   O2SAT 98.8 11/06/2024 0045     Coagulation Profile: No results for input(s): INR, PROTIME in the last 168 hours.  Cardiac Enzymes: No results for input(s): CKTOTAL, CKMB, CKMBINDEX, TROPONINI in the last 168 hours.  HbA1C: Hemoglobin A1C  Date/Time Value Ref Range Status  12/11/2023 04:01 PM 6.2 (A) 4.0 - 5.6 % Final   Hgb A1c MFr Bld  Date/Time Value Ref Range Status  05/22/2018 12:02 AM 6.2 (H) 4.8 - 5.6 % Final    Comment:    (NOTE) Pre diabetes:          5.7%-6.4% Diabetes:              >6.4% Glycemic control for   <7.0% adults with diabetes     CBG: Recent Labs  Lab 11/13/24 2326 11/14/24 0310 11/14/24 0742 11/14/24 1127 11/14/24 1541  GLUCAP 157* 115* 127* 139*  165*   MEDICATION ADJUSTMENTS/LABS AND TESTS ORDERED:    CURRENT MEDICATIONS REVIEWED AT LENGTH WITH PATIENT TODAY    I spent a total of 35  minutes dedicated to the care of this patient on the date of this encounter to include pre-visit review of records, face-to-face time with the patient discussing conditions above, post visit ordering of testing, clinical documentation with the electronic health record, making appropriate referrals as documented, and communicating necessary information to the patient's healthcare team.    The Patient requires high complexity decision making for assessment and support, frequent evaluation and titration of therapies, application of advanced monitoring technologies and extensive interpretation of multiple databases.  Patient satisfied with Plan of action and management. All questions answered    Nickolas Alm Cellar, M.D.  [facility]  Medical Director [facility] [place]         "

## 2024-11-14 NOTE — Progress Notes (Signed)
 "                                                                                                                                                                                               Palliative Care Progress Note, Assessment & Plan   Patient Name: Rachael Blankenship       Date: 11/14/2024 DOB: [birth year]  Age: 68 y.o. MRN#: [id number] Attending Physician: Isaiah Scrivener, MD Primary Care Physician: Fernande Ophelia JINNY DOUGLAS, MD Admit Date: 11/04/2024  Subjective: Denies pain. Endorses conversational dyspnea. Reports eating soft breakfast this morning. Slept well last night. Denies CP. She reports relief from SOB with morphine .   HPI: Per previous HPI- 68 y.o. female with past medical history of HTN, HFpEF, pulmonary HTN, chronic respiratory failure, wears bipap at night, advanced COPD admitted on 11/04/2024 with COPD exacerbation. Initially required intubation- was extubated on 12/15. Continues on HHFNC at 40LPM. Palliative medicine consulted for goals of care.   Summary of counseling/coordination of care: Extensive chart review completed prior to meeting patient including labs, vital signs, imaging, progress notes, orders, and available advanced directive documents from current and previous encounters.   After reviewing the patient's chart and assessing the patient at bedside, I spoke with patient  and husband in regards to symptom management and goals of care.      Latest Ref Rng & Units 11/14/2024    3:43 AM 11/13/2024    3:34 AM 11/12/2024    3:45 AM  CBC  WBC 4.0 - 10.5 K/uL 9.7  10.0  9.1   Hemoglobin 12.0 - 15.0 g/dL 9.6  9.4  89.7   Hematocrit 36.0 - 46.0 % 31.8  30.9  33.7   Platelets 150 - 400 K/uL 315  317  314       Latest Ref Rng & Units 11/14/2024    3:43 AM 11/13/2024   12:34 PM 11/13/2024    3:34 AM  CMP  Glucose 70 - 99 mg/dL 94   83   BUN 8 - 23 mg/dL 18   21   Creatinine 9.55 - 1.00 mg/dL 9.62   9.61   Sodium 864 - 145 mmol/L 143   143   Potassium 3.5 - 5.1 mmol/L  4.2  4.1  2.6   Chloride 98 - 111 mmol/L 98   98   CO2 22 - 32 mmol/L 38   38   Calcium 8.9 - 10.3 mg/dL 9.2   8.7    Ill-appearing female resting in bed with husband at bedside. She is alert but appears sleepy after morphine  injection. She is able to participate in  conversation making her wishes known. She confirms that she wishes to go home with hospice. Tachypnea noted with accessory muscle use. She is in no distress.   Husband shares that he has spoken with hospice liaison and plan is to transport home Tuesday once medical equipment has been received. He shares the morphine  Rachael Blankenship is receiving has made a difference in breathing pattern and that she seems more comfortable talking. Discussed that she should continue morphine  as ordered for symptom management.    Therapeutic silence and active listening provided for patient and husband to share their thoughts and emotions regarding current medical situation.  Emotional support provided.  Physical Exam Vitals reviewed.  Constitutional:      General: She is not in acute distress.    Appearance: She is ill-appearing.  HENT:     Head: Normocephalic and atraumatic.     Mouth/Throat:     Mouth: Mucous membranes are moist.  Pulmonary:     Effort: No respiratory distress.     Comments: Accessory muscle use Tachypneic with conversational dyspnea  Skin:    General: Skin is warm and dry.  Neurological:     Mental Status: She is alert and oriented to person, place, and time.  Psychiatric:        Mood and Affect: Mood normal.        Behavior: Behavior normal.        Thought Content: Thought content normal.        Judgment: Judgment normal.    Recommendations/Plan: DNR/DNI status Plan to discharge home with hospice  [facility] liaison facilitating transport home  Continue Morphine  liquid concentrate 5mg  q4 hours scheduled, continue every 2 hours prn- recommend giving as premed before ADLs  PMT will follow until discharge    Total Time 50  minutes   Time spent includes: Detailed review of medical records (labs, imaging, vital signs), medically appropriate exam (mental status, respiratory, cardiac, skin), discussed with treatment team, counseling and educating patient, family and staff, documenting clinical information, medication management and coordination of care.     Devere Sacks, AMANDA [facility] Palliative Medicine Team  11/14/2024 8:44 AM  Office [phone]  Pager [phone]     "

## 2024-11-14 NOTE — Progress Notes (Signed)
 PHARMACY CONSULT NOTE  Pharmacy Consult for Electrolyte Monitoring and Replacement   Recent Labs: Potassium (mmol/L)  Date Value  11/14/2024 4.2   Magnesium  (mg/dL)  Date Value  87/78/7974 2.0   Calcium (mg/dL)  Date Value  87/78/7974 9.2   Albumin (g/dL)  Date Value  87/81/7974 3.2 (L)  11/21/2023 4.4   Phosphorus (mg/dL)  Date Value  87/79/7974 2.6   Sodium (mmol/L)  Date Value  11/14/2024 143  11/21/2023 141   Assessment: 68 year old female with a history of smoking and COPD with chronic hypoxic and hypercapnic respiratory failure who presents to the hospital with increased shortness of breath, cough, and wheezing. Pharmacy is asked to follow and replace electrolytes while in CCU.   Goal of Therapy:  Electrolytes WNL  Plan:  --no electrolyte replacement warranted for today --Follow-up electrolytes with AM labs tomorrow  Rachael Blankenship 11/14/2024 7:01 AM

## 2024-11-14 NOTE — Progress Notes (Signed)
 Transfer of care and verbal report received from Olita Pizza, CHARITY FUNDRAISER. Patient is in no obvious distress. Oral care performed, patient placed on Bipap at this time per order. Oxygen  saturation at 92%. Respiratory therapist notified. Patient denies pain or discomfort at this time.

## 2024-11-15 ENCOUNTER — Other Ambulatory Visit: Payer: Self-pay

## 2024-11-15 LAB — CBC
HCT: 29.5 % — ABNORMAL LOW (ref 36.0–46.0)
Hemoglobin: 8.8 g/dL — ABNORMAL LOW (ref 12.0–15.0)
MCH: 27.2 pg (ref 26.0–34.0)
MCHC: 29.8 g/dL — ABNORMAL LOW (ref 30.0–36.0)
MCV: 91 fL (ref 80.0–100.0)
Platelets: 304 K/uL (ref 150–400)
RBC: 3.24 MIL/uL — ABNORMAL LOW (ref 3.87–5.11)
RDW: 15 % (ref 11.5–15.5)
WBC: 9.5 K/uL (ref 4.0–10.5)
nRBC: 0 % (ref 0.0–0.2)

## 2024-11-15 LAB — BASIC METABOLIC PANEL WITH GFR
Anion gap: 5 (ref 5–15)
BUN: 13 mg/dL (ref 8–23)
CO2: 40 mmol/L — ABNORMAL HIGH (ref 22–32)
Calcium: 8.8 mg/dL — ABNORMAL LOW (ref 8.9–10.3)
Chloride: 96 mmol/L — ABNORMAL LOW (ref 98–111)
Creatinine, Ser: 0.36 mg/dL — ABNORMAL LOW (ref 0.44–1.00)
GFR, Estimated: >60 mL/min
Glucose, Bld: 104 mg/dL — ABNORMAL HIGH (ref 70–99)
Potassium: 4.6 mmol/L (ref 3.5–5.1)
Sodium: 140 mmol/L (ref 135–145)

## 2024-11-15 LAB — GLUCOSE, CAPILLARY
Glucose-Capillary: 101 mg/dL — ABNORMAL HIGH (ref 70–99)
Glucose-Capillary: 104 mg/dL — ABNORMAL HIGH (ref 70–99)
Glucose-Capillary: 127 mg/dL — ABNORMAL HIGH (ref 70–99)

## 2024-11-15 LAB — MAGNESIUM: Magnesium: 1.9 mg/dL (ref 1.7–2.4)

## 2024-11-15 MED ORDER — PREDNISONE 10 MG PO TABS
ORAL_TABLET | ORAL | 0 refills | Status: AC
  Filled 2024-11-15 (×2): qty 9, 6d supply, fill #0

## 2024-11-15 MED ORDER — MORPHINE SULFATE (CONCENTRATE) 10 MG /0.5 ML PO SOLN: 5.0000 mg | ORAL | 0 refills | Status: DC | PRN

## 2024-11-15 MED ORDER — ASPIRIN 81 MG PO TBEC
81.0000 mg | DELAYED_RELEASE_TABLET | Freq: Every day | ORAL | 12 refills | Status: DC
  Filled 2024-11-15: qty 30, 30d supply, fill #0

## 2024-11-15 MED ORDER — SPIRIVA RESPIMAT 2.5 MCG/ACT IN AERS
2.0000 | INHALATION_SPRAY | Freq: Every day | RESPIRATORY_TRACT | 0 refills | Status: DC
  Filled 2024-11-15: qty 4, 30d supply, fill #0

## 2024-11-15 MED ORDER — ARFORMOTEROL TARTRATE 15 MCG/2ML IN NEBU
15.0000 ug | INHALATION_SOLUTION | Freq: Two times a day (BID) | RESPIRATORY_TRACT | 0 refills | Status: DC
  Filled 2024-11-15 (×2): qty 120, 30d supply, fill #0

## 2024-11-15 MED ORDER — INCRUSE ELLIPTA 62.5 MCG/ACT IN AEPB
1.0000 | INHALATION_SPRAY | Freq: Every day | RESPIRATORY_TRACT | 0 refills | Status: DC
  Filled 2024-11-15: qty 30, 30d supply, fill #0

## 2024-11-15 MED ORDER — DIAZEPAM 5 MG PO TABS: 5.0000 mg | ORAL_TABLET | Freq: Three times a day (TID) | ORAL | 0 refills | Status: DC

## 2024-11-15 MED ORDER — FOSFOMYCIN TROMETHAMINE 3 G PO PACK
3.0000 g | PACK | Freq: Once | ORAL | Status: AC
  Administered 2024-11-15: 3 g via ORAL
  Filled 2024-11-15: qty 3

## 2024-11-15 MED ORDER — DIAZEPAM 5 MG PO TABS
5.0000 mg | ORAL_TABLET | Freq: Three times a day (TID) | ORAL | 0 refills | Status: DC
  Filled 2024-11-15: qty 30, 10d supply, fill #0

## 2024-11-15 MED ORDER — MORPHINE SULFATE (CONCENTRATE) 10 MG /0.5 ML PO SOLN
5.0000 mg | ORAL | 0 refills | Status: AC | PRN
  Filled 2024-11-15: qty 15, 10d supply, fill #0

## 2024-11-15 MED ORDER — BUDESONIDE 0.5 MG/2ML IN SUSP
0.5000 mg | Freq: Two times a day (BID) | RESPIRATORY_TRACT | 0 refills | Status: DC
  Filled 2024-11-15 (×2): qty 120, 30d supply, fill #0

## 2024-11-15 NOTE — Discharge Summary (Signed)
 Physician Discharge Summary  Patient ID: Rachael Blankenship MRN: [id number] DOB/AGE: January 09, 1956 68 y.o.  Admit date: 11/04/2024 Discharge date: 11/15/2024   Brief Pt Description / Synopsis:  68 y.o female with past medical history most significant for End Stage COPD and severe Anxiety, who is admitted with Acute on Chronic Hypoxic and Hypercapnic Respiratory Failure due to severe Acute COPD Exacerbation, failed trial of BiPAP requiring intubation and mechanical ventilation. Extubated on 11/08/24.  Discharging home with Hospice.  Discharge Diagnoses:   Acute on Chronic Hypoxic & Hypercapnic Respiratory Failure Acute COPD Exacerbation Chronic Atelectasis of Right Middle Lobe Hypertension Acute Metabolic Encephalopathy Anxiety Depression                                                            Discharge Summary:  Patient is a 68 year old female with a history of smoking and COPD with chronic hypoxic and hypercapnic respiratory failure who presents to the hospital with increased shortness of breath, cough, and wheezing consistent with a COPD exacerbation.   Patient reports symptom onset around 7 days ago with worsening shortness of breath, cough, wheezing, and increased exertional dyspnea.  She reports having increased oxygen  requirements and inability to walk or move short distances without having significant desaturation.  This further worsened over this week and she was unable to wait until her scheduled appointment with her primary pulmonologist prompting her to present to the ED.   EMS was activated and she was brought in with hypoxia and increased work of breathing.  She received IV steroids and was started on BiPAP for respiratory distress and admitted to the medicine service for further workup and management.  Patient started on IV Solu-Medrol , doxycycline , and received a dose of acetazolamide .  Given lack of improvement today, pulmonary and critical care medicine were consulted for  increased risk of respiratory failure.   Medical chart reviewed, patient had a history of smoking and she quit 1 year ago.  Patient has been followed by outpatient pulmonary for severe COPD with her last FEV1 at 0.61 L (29% predicted).  She has chronic hypoxia for which she was started on supplemental oxygen .   Patient was admitted to the hospital in January 2025 with acute on chronic hypoxic respiratory failure with ICU admission.  She was continued on nocturnal BiPAP after send admission and switched to triple therapy with Breztri  and Ensifentrine  was considered.  She was started on Dupixent to reduce frequency of COPD exacerbations early in 2025.  She was further referred to cardiology for consideration of a right heart catheterization for workup of pulmonary hypertension.   Right heart cath 04/21/2024 showed RA 10/6, PA 40/12 (24), and PCWP of 20 not indicative of pulmonary hypertension.   CT chest ruled out PE in October 2025 after an exacerbation.  This was notable for unchanged complete collapse of the right middle lobe with atelectasis in the lingula.  She then underwent bronchoscopy with BAL of the right middle lobe with cultures returning negative.   Patient then admitted in November 2025 with COPD exacerbation treated with antibiotics and discharged on levofloxacin  as well as a prednisone  taper.  Patient seen by her pulmonologist in early November where her prednisone  taper was extended (taper by 5 mg every 5 days).  She was also started on thrice weekly azithromycin .  TRH asked to admit for further workup and treatment.   Please see Significant Hospital Events section below for full detailed hospital course.   Significant Events:  11/04/24: Admitted with COPD exacerbation, on BiPAP 11/05/24: Consult to critical care for respiratory failure and risk of decompensation and need for intubation and mechanical ventilation. Intubated at night 11/06/24: On dexmedetomidine  and fentanyl  for  analgesia and sedation. Synchronous with the vent. Family updated at bedside at length 11/07/24: WUA this AM, agitated on dexmedetomidine  (prop/fentanyl  dc'd). Attempt SBT but failed due to sever anxiety 11/08/24; No significant events overnight. Afebrile, hemodynamically stable, off Levophed .  On minimal vent support, plan for WUA and SBT utilizing Precedex .  EXTUBATED to HFNC, respiratory status remains tenuous. 11/09/24: Extubated yesterday, Patient on HHFNC 30% 45 l/m, Patient states breathing is fine. Some somewhat breathless when trying to speak. Rr upper 20s. Per nurse patient gets anxious and pulls at things. On 1 precedex . Code status changed to DNR (ok with intubation).  Passed swallow evaluation. 11/10/24: No acute events overnight.  Afebrile, not requiring Levophed .  Tolerated BiPaP at bedtime, remains on Precedex  (weaning down), respiratory status slowly improving, remains on HHFNC. 11/11/24: No acute events overnight, tolerated BiPAP with Precedex , requesting to go back to HFNC.  Attempt to wean off Precedex  today, will start Clonidine  to assist with weaning.  Transition IV to PO Valium .  Decrease solumedrol to 20 mg daily.  Diurese with 20 mg Lasix  x1 dose. Consult Palliative Care to assist with GOC discussions. Code status changed to DNR/DNI, goal to discharge home with hospice.  11/12/24: No acute events overnight. Afebrile, hemodynamically stable.  Tolerated BiPAP overnight, remains on Precedex , now on HHFNC but respiratory status remains tenuous.  Diurese with 40 mg IV lasix  x1 dose.  Family wanting to take pt home with hospice.  11/13/24: Ate breakfast. Plan is home with hospice  11/14/24: No acute issues overnight. Awaiting palliative care plan regarding home hospice.  We spoke with husband at length regarding her care options.  Patient is already on an noninvasive vent at home.  Oral intake remains poor.  She still gets severely dyspneic with minimal exertion.  Overall she is at her  baseline.  Patient is off Precedex . Tolerating morphine  and valium   11/15/24: No significant events overnight.  Afebrile, hemodynamically stable.  Plan for discharge Home with Hospice today  Discharge Plan by Diagnosis:   #Acute on Chronic Hypoxic & Hypercapnic Respiratory Failure #COPD Exacerbation #Chronic Atelectasis of RML PMHx: COPD on Breztri  and Dupilumab -Supplemental O2 as needed to maintain O2 sats 88 to 92% -BiPAP at bedtime and PRN  -Bronchodilators & Pulmicort  nebs -Complete Prednisone  taper  -Completed course of Azithromycin  & Zosyn  on 12/18 -Continue home Lasix  -Pulmonary toilet as able -Discharge home with Hospice: continue prn Morphine  and Valium .   She already has noninvasive ventilator at home    #Hypertension PMHx: HFpEF, HLD Echo 11/2023: Grade I diastolic dysfunction -Continue home Lasix   -Continue home Metoprolol     #Acute Metabolic Encephalopathy ~ RESOLVED  #Anxiety #Depression  -Provide supportive care -Promote normal sleep/wake cycle and family presence -Continue Valium  to 5 mg q8h  -Continue morphine  5 mg q2h prn for air hunger  -Continue Wellbutrin  and Cymbalta                   Significant Diagnostic Studies:  12/11: Chest X-ray>>IMPRESSION: 1. No acute cardiopulmonary abnormality.            Micro Data:  12/11: COVID/FLU/RSV PCR>> negative 12/11; Blood cultures  x2>> no growth  12/12: RVP>> negative  12/12: Strep pneumo urinary antigen>> negative  12/12: Legionella urinary antigen>> negative 12/12: MRSA PCR>>negative  12/13: Tracheal aspirate>> rare candida albicans   Antimicrobials:   Anti-infectives (From admission, onward)    Start     Dose/Rate Route Frequency Ordered Stop   11/15/24 1100  fosfomycin  (MONUROL ) packet 3 g        3 g Oral  Once 11/15/24 1011 11/15/24 1128   11/07/24 1400  piperacillin -tazobactam (ZOSYN ) IVPB 3.375 g        3.375 g 12.5 mL/hr over 240 Minutes Intravenous Every 8 hours 11/07/24 1026 11/11/24 2359    11/05/24 1430  ceFEPIme  (MAXIPIME ) 2 g in sodium chloride  0.9 % 100 mL IVPB  Status:  Discontinued        2 g 200 mL/hr over 30 Minutes Intravenous Every 12 hours 11/05/24 1322 11/07/24 1009   11/04/24 2315  doxycycline  (VIBRAMYCIN ) 100 mg in sodium chloride  0.9 % 250 mL IVPB  Status:  Discontinued        100 mg 125 mL/hr over 120 Minutes Intravenous 2 times daily 11/04/24 2300 11/09/24 1056   11/04/24 1445  doxycycline  (VIBRA -TABS) tablet 100 mg  Status:  Discontinued        100 mg Oral Every 12 hours 11/04/24 1432 11/04/24 2304   11/04/24 1130  cefTRIAXone  (ROCEPHIN ) 2 g in sodium chloride  0.9 % 100 mL IVPB        2 g 200 mL/hr over 30 Minutes Intravenous Once 11/04/24 1121 11/04/24 1210   11/04/24 1130  azithromycin  (ZITHROMAX ) 500 mg in sodium chloride  0.9 % 250 mL IVPB        500 mg 250 mL/hr over 60 Minutes Intravenous  Once 11/04/24 1121 11/04/24 1314        Consults:  PCCM Palliative Care   Discharge Exam:   General: Acute on chronically ill appearing female, laying in bed, on 6L [place], in NAD HENT: Atraumatic, normocephalic, neck supple, no JVD Lungs: Diminished breath sounds throughout, even, family reports she is at her baseline effort Cardiovascular: Regular rate and rhythm, s1s2, no M/R/G Abdomen: Soft, nontender, nondistended, no guarding or rebound tenderness, BS+ x4 Extremities: Normal bulk and tone, no deformities, no edema, good peripheral perfusion Neuro: Awake and alert, no anxiety this morning, following commands, no focal deficits noted, PERRL GU: External catheter in place    Vitals:   11/15/24 0800 11/15/24 0900 11/15/24 1000 11/15/24 1100  BP: 138/76 (!) 152/75 122/74 118/70  Pulse: 96 (!) 101 (!) 104 95  Resp: 20 (!) 21 (!) 21 15  Temp:      TempSrc:      SpO2: 100% 97% 98% 98%  Weight:      Height:         Discharge Labs:   BMET Recent Labs  Lab 11/09/24 0335 11/10/24 0509 11/11/24 0544 11/12/24 0345 11/13/24 0334 11/13/24 1234  11/14/24 0343 11/15/24 0346  NA 148* 149* 146* 145 143  --  143 140  K 3.9 3.8 3.9 3.5 2.6*   < > 4.2 4.6  CL 110 113* 106 105 98  --  98 96*  CO2 29 29 32 33* 38*  --  38* 40*  GLUCOSE 153* 142* 102* 111* 83  --  94 104*  BUN 51* 52* 40* 36* 21  --  18 13  CREATININE 0.68 0.57 0.54 0.51 0.38*  --  0.37* 0.36*  CALCIUM 9.3 9.4 9.2 9.1 8.7*  --  9.2 8.8*  MG 2.5* 2.3 2.1 1.9 1.9  --  2.0 1.9  PHOS 3.5 3.2 3.5 3.5 2.6  --   --   --    < > = values in this interval not displayed.    CBC Recent Labs  Lab 11/13/24 0334 11/14/24 0343 11/15/24 0346  HGB 9.4* 9.6* 8.8*  HCT 30.9* 31.8* 29.5*  WBC 10.0 9.7 9.5  PLT 317 315 304    Anti-Coagulation No results for input(s): INR in the last 168 hours.        Allergies as of 11/15/2024       Reactions   Naproxen Rash, Other (See Comments)   Spouse confirmed that pt breaks out in hives head to toe.    Sulfa Antibiotics Hives, Rash   Sulfa products.         Medication List     STOP taking these medications    ALPRAZolam  0.25 MG tablet Commonly known as: XANAX    ascorbic acid  500 MG tablet Commonly known as: VITAMIN C    aspirin  81 MG tablet Replaced by: aspirin  EC 81 MG tablet   azithromycin  250 MG tablet Commonly known as: ZITHROMAX    Breztri  Aerosphere 160-9-4.8 MCG/ACT Aero inhaler Generic drug: budesonide -glycopyrrolate -formoterol    Cholecalciferol  25 MCG (1000 UT) tablet   Co Q-10 100 MG Caps   Denta 5000 Plus 1.1 % Crea dental cream Generic drug: sodium fluoride   Dupilumab 300 MG/2ML Soaj   feeding supplement Liqd   gemfibrozil  600 MG tablet Commonly known as: LOPID    Magnesium  250 MG Caps   multivitamin with minerals tablet   vitamin E  180 MG (400 UNITS) capsule       TAKE these medications    arformoterol  15 MCG/2ML Nebu Commonly known as: BROVANA  Take 2 mLs (15 mcg total) by nebulization every 12 (twelve) hours.   aspirin  EC 81 MG tablet Take 1 tablet (81 mg total) by mouth  at bedtime. Swallow whole. Replaces: aspirin  81 MG tablet   budesonide  0.5 MG/2ML nebulizer solution Commonly known as: PULMICORT  Take 2 mLs (0.5 mg total) by nebulization 2 (two) times daily.   buPROPion  75 MG tablet Commonly known as: WELLBUTRIN  Take 75 mg by mouth 2 (two) times daily.   Combivent  Respimat 20-100 MCG/ACT Aers respimat Generic drug: Ipratropium-Albuterol  INHALE 1 PUFF INTO THE LUNGS EVERY 6 HOURS AS NEEDED FOR WHEEZING OR SHORTNESS OF BREATH   dextromethorphan-guaiFENesin  30-600 MG 12hr tablet Commonly known as: MUCINEX  DM Take 1 tablet by mouth 2 (two) times daily as needed for cough.   diazepam  5 MG tablet Commonly known as: VALIUM  Take 1 tablet (5 mg total) by mouth every 8 (eight) hours.   DULoxetine  60 MG capsule Commonly known as: CYMBALTA  Take 1 capsule (60 mg total) by mouth daily.   EPINEPHrine  0.3 mg/0.3 mL Soaj injection Commonly known as: EPI-PEN Inject 0.3 mg into the muscle as needed.   famotidine  40 MG tablet Commonly known as: PEPCID  Take 1 tablet (40 mg total) by mouth 2 (two) times daily.   fluticasone  50 MCG/ACT nasal spray Commonly known as: FLONASE  Place 2 sprays into both nostrils daily.   furosemide  40 MG tablet Commonly known as: LASIX  TAKE 1 TABLET BY MOUTH DAILY   metoprolol  tartrate 25 MG tablet Commonly known as: LOPRESSOR  Take 0.5 tablets (12.5 mg total) by mouth 2 (two) times daily.   morphine  CONCENTRATE 10 mg / 0.5 ml concentrated solution Take 0.25 mLs (5 mg total) by mouth every 2 (two) hours as  needed for up to 5 days for shortness of breath, anxiety or severe pain (pain score 7-10) (air hunger).   OXYGEN  Inhale 2 L into the lungs continuous. Continuous oxygen  and NIV at night, 2 liters   pt uses [facility] for oxygen  supplies   Potassium Chloride  CR 8 MEQ Cpcr capsule CR Commonly known as: MICRO-K  Take 1 capsule (8 mEq total) by mouth daily.   predniSONE  10 MG tablet Commonly known as: DELTASONE  Take 2 tablets  (20 mg total) by mouth daily for 3 days, THEN 1 tablet (10 mg total) daily for 3 days. Start taking on: November 15, 2024 What changed:  medication strength See the new instructions.   Spiriva  Respimat 2.5 MCG/ACT Aers Generic drug: Tiotropium Bromide  Inhale 2 puffs into the lungs daily.   sucralfate  1 g tablet Commonly known as: CARAFATE  Take 1 g by mouth 4 (four) times daily as needed (Stomach).               Durable Medical Equipment  (From admission, onward)           Start     Ordered   11/15/24 0726  For home use only DME Bipap  Once       Comments: IVAP with AE RR 15-18 Tidal Volume 350-650 Pressure Support Minimum 5-8 Pressure Maximum 25-35 EPAP 5-8 Minimum EPAP 10-15 Maximum Max Pressure 35 Full Face Mask Size: Small  Question Answer Comment  Length of Need Lifetime   Inspiratory pressure OTHER SEE COMMENTS   Expiratory pressure OTHER SEE COMMENTS      11/15/24 0730                Disposition: Home with Hospice   Discharged Condition: Rachael Blankenship has met maximum benefit of inpatient care and is medically stable and cleared for discharge.  Patient is pending follow up as above.      Time spent on disposition:  40 Minutes.     Signed: Inge Lecher, AGACNP-BC [facility] Prefer epic messenger for cross cover needs If after hours, please call E-link

## 2024-11-15 NOTE — Plan of Care (Signed)
 Patient is being discharged today to home with hospice. Home medications have been obtained and are at bedside. DME has been delivered to home, and Hospice nurse scheduled to meet with patient at 5 pm. Transportation has been arranged by Child psychotherapist, to take place this afternoon; (have received communication that as of 1410 pm, pt is 4th in line). Sister is visiting with patient at this time.   Problem: Education: Goal: Knowledge of General Education information will improve Description: Including pain rating scale, medication(s)/side effects and non-pharmacologic comfort measures Outcome: Progressing   Problem: Clinical Measurements: Goal: Ability to maintain clinical measurements within normal limits will improve Outcome: Progressing Goal: Will remain free from infection Outcome: Progressing Goal: Diagnostic test results will improve Outcome: Progressing Goal: Respiratory complications will improve Outcome: Progressing Goal: Cardiovascular complication will be avoided Outcome: Progressing   Problem: Activity: Goal: Risk for activity intolerance will decrease Outcome: Progressing   Problem: Nutrition: Goal: Adequate nutrition will be maintained Outcome: Progressing   Problem: Coping: Goal: Level of anxiety will decrease Outcome: Progressing   Problem: Elimination: Goal: Will not experience complications related to bowel motility Outcome: Progressing Goal: Will not experience complications related to urinary retention Outcome: Progressing   Problem: Pain Managment: Goal: General experience of comfort will improve and/or be controlled Outcome: Progressing   Problem: Safety: Goal: Ability to remain free from injury will improve Outcome: Progressing   Problem: Skin Integrity: Goal: Risk for impaired skin integrity will decrease Outcome: Progressing   Problem: Education: Goal: Knowledge of disease or condition will improve Outcome: Progressing   Problem:  Activity: Goal: Ability to tolerate increased activity will improve Outcome: Progressing Goal: Will verbalize the importance of balancing activity with adequate rest periods Outcome: Progressing   Problem: Respiratory: Goal: Ability to maintain a clear airway will improve Outcome: Progressing Goal: Levels of oxygenation will improve Outcome: Progressing Goal: Ability to maintain adequate ventilation will improve Outcome: Progressing

## 2024-11-15 NOTE — Progress Notes (Signed)
 "                                                                                                                                                                                               Palliative Care Progress Note, Assessment & Plan   Patient Name: Rachael Blankenship       Date: 11/15/2024 DOB: 10-10-1956  Age: 68 y.o. MRN#: [id number] Attending Physician: Isadora Hose, MD Primary Care Physician: Fernande Ophelia JINNY DOUGLAS, MD Admit Date: 11/04/2024  Subjective: Patient denies pain.  Endorses symptom control with current medications that she will be going home with.  She shares that she is going home today with hospice.  HPI: Per previous HPI- 68 y.o. female with past medical history of HTN, HFpEF, pulmonary HTN, chronic respiratory failure, wears bipap at night, advanced COPD admitted on 11/04/2024 with COPD exacerbation. Initially required intubation- was extubated on 12/15. Continues on HHFNC at 40LPM. Palliative medicine consulted for goals of care.   Summary of counseling/coordination of care: Extensive chart review completed prior to meeting patient including labs, vital signs, imaging, progress notes, orders, and available advanced directive documents from current and previous encounters.   After reviewing the patient's chart and assessing the patient at bedside, I spoke with patient in regards to symptom management and goals of care.   Appearing female resting in bed.  No family or visitors at bedside during visit.  She is alert but sleepy.  She is able to participate in conversation.  She confirms she is going home with hospice today.  Mild accessory muscle use noted.  She is in no distress.  Therapeutic silence and active listening provided for patient to share her thoughts and emotions regarding current medical situation.  Emotional support provided.  Physical Exam Vitals and nursing note reviewed.  Constitutional:      General: She is not in acute distress.    Appearance: She is  ill-appearing.  HENT:     Head: Normocephalic and atraumatic.     Mouth/Throat:     Mouth: Mucous membranes are moist.  Pulmonary:     Effort: No respiratory distress.     Comments: Mild accessory muscle use Skin:    General: Skin is warm and dry.  Neurological:     Mental Status: She is alert.     Motor: Weakness present.    Recommendations/Plan: DNR/DNI status Plan to discharge home with hospice today [facility] liaison facilitating transport home  Continue Morphine  liquid concentrate 5mg  q4 hours scheduled, continue every 2 hours prn- recommend giving as premed before ADLs         Total  Time 50 minutes   Time spent includes: Detailed review of medical records (labs, imaging, vital signs), medically appropriate exam (mental status, respiratory, cardiac, skin), discussed with treatment team, counseling and educating patient, family and staff, documenting clinical information, medication management and coordination of care.     Devere Sacks, ELNITA- [facility] Palliative Medicine Team  11/15/2024 11:04 AM  Office [phone]  Pager [phone]   \ "

## 2024-11-15 NOTE — Progress Notes (Addendum)
 Patient discharge instructions reviewed with patient and family at bedside (sister-in-law). PIV and PureWick removed. [facility] transport to bedside and assisted pt onto stretcher, on 6L [address]2 [place]. Being discharged to home with Hospice.

## 2024-11-15 NOTE — Progress Notes (Addendum)
 "  NAME:  Rachael Blankenship, MRN:  [id number], DOB:  May 23, 1956, LOS: 11 ADMISSION DATE:  11/04/2024, CONSULTATION DATE:  11/05/2024 CHIEF COMPLAINT:  Respiratory Failure    Brief Pt Description / Synopsis:  68 y.o female admitted with Acute on Chronic Hypoxic and Hypercapnic Respiratory Failure due to severe Acute COPD Exacerbation, failed trial of BiPAP requiring intubation and mechanical ventilation.   History of Present Illness:  Patient is a 68 year old female with a history of smoking and COPD with chronic hypoxic and hypercapnic respiratory failure who presents to the hospital with increased shortness of breath, cough, and wheezing consistent with a COPD exacerbation.   Patient reports symptom onset around 7 days ago with worsening shortness of breath, cough, wheezing, and increased exertional dyspnea.  She reports having increased oxygen  requirements and inability to walk or move short distances without having significant desaturation.  This further worsened over this week and she was unable to wait until her scheduled appointment with her primary pulmonologist prompting her to present to the ED.   EMS was activated and she was brought in with hypoxia and increased work of breathing.  She received IV steroids and was started on BiPAP for respiratory distress and admitted to the medicine service for further workup and management.  Patient started on IV Solu-Medrol , doxycycline , and received a dose of acetazolamide .  Given lack of improvement today, pulmonary and critical care medicine were consulted for increased risk of respiratory failure.   Medical chart reviewed, patient had a history of smoking and she quit 1 year ago.  Patient has been followed by outpatient pulmonary for severe COPD with her last FEV1 at 0.61 L (29% predicted).  She has chronic hypoxia for which she was started on supplemental oxygen .   Patient was admitted to the hospital in January 2025 with acute on chronic hypoxic  respiratory failure with ICU admission.  She was continued on nocturnal BiPAP after send admission and switched to triple therapy with Breztri  and Ensifentrine  was considered.  She was started on Dupixent to reduce frequency of COPD exacerbations early in 2025.  She was further referred to cardiology for consideration of a right heart catheterization for workup of pulmonary hypertension.   Right heart cath 04/21/2024 showed RA 10/6, PA 40/12 (24), and PCWP of 20 not indicative of pulmonary hypertension.   CT chest ruled out PE in October 2025 after an exacerbation.  This was notable for unchanged complete collapse of the right middle lobe with atelectasis in the lingula.  She then underwent bronchoscopy with BAL of the right middle lobe with cultures returning negative.   Patient then admitted in November 2025 with COPD exacerbation treated with antibiotics and discharged on levofloxacin  as well as a prednisone  taper.  Patient seen by her pulmonologist in early November where her prednisone  taper was extended (taper by 5 mg every 5 days).  She was also started on thrice weekly azithromycin .  [facility] asked to admit for further workup and treatment.  Please see Significant Hospital Events section below for full detailed hospital course.   Pertinent  Medical History   Past Medical History:  Diagnosis Date   Bruises easily    Chronic respiratory failure with hypoxia (HCC)    Cigarette smoker    Collapse of right lung    COPD (chronic obstructive pulmonary disease) (HCC)    COVID-19 07/2024   Dependence on supplemental oxygen     Depression with anxiety    Endometriosis    GERD (gastroesophageal reflux disease)  Grade I diastolic dysfunction    History of sepsis    Hyperlipidemia    Hypertension 06/02/2012   Kidney stones    Pulmonary HTN (HCC)    Reactive airway disease 03/08/2014   Vitamin D  deficiency     Micro Data:  12/11: COVID/FLU/RSV PCR>> negative 12/11; Blood cultures x2>>  no growth  12/12: RVP>> negative  12/12: Strep pneumo urinary antigen>> negative  12/12: Legionella urinary antigen>> negative 12/12: MRSA PCR>>negative  12/13: Tracheal aspirate>> rare candida albicans  Antimicrobials:   Anti-infectives (From admission, onward)    Start     Dose/Rate Route Frequency Ordered Stop   11/07/24 1400  piperacillin -tazobactam (ZOSYN ) IVPB 3.375 g        3.375 g 12.5 mL/hr over 240 Minutes Intravenous Every 8 hours 11/07/24 1026 11/11/24 2359   11/05/24 1430  ceFEPIme  (MAXIPIME ) 2 g in sodium chloride  0.9 % 100 mL IVPB  Status:  Discontinued        2 g 200 mL/hr over 30 Minutes Intravenous Every 12 hours 11/05/24 1322 11/07/24 1009   11/04/24 2315  doxycycline  (VIBRAMYCIN ) 100 mg in sodium chloride  0.9 % 250 mL IVPB  Status:  Discontinued        100 mg 125 mL/hr over 120 Minutes Intravenous 2 times daily 11/04/24 2300 11/09/24 1056   11/04/24 1445  doxycycline  (VIBRA -TABS) tablet 100 mg  Status:  Discontinued        100 mg Oral Every 12 hours 11/04/24 1432 11/04/24 2304   11/04/24 1130  cefTRIAXone  (ROCEPHIN ) 2 g in sodium chloride  0.9 % 100 mL IVPB        2 g 200 mL/hr over 30 Minutes Intravenous Once 11/04/24 1121 11/04/24 1210   11/04/24 1130  azithromycin  (ZITHROMAX ) 500 mg in sodium chloride  0.9 % 250 mL IVPB        500 mg 250 mL/hr over 60 Minutes Intravenous  Once 11/04/24 1121 11/04/24 1314       Significant Hospital Events: Including procedures, antibiotic start and stop dates in addition to other pertinent events   11/04/24: Admitted with COPD exacerbation, on BiPAP 11/05/24: Consult to critical care for respiratory failure and risk of decompensation and need for intubation and mechanical ventilation. Intubated at night 11/06/24: On dexmedetomidine  and fentanyl  for analgesia and sedation. Synchronous with the vent. Family updated at bedside at length 11/07/24: WUA this AM, agitated on dexmedetomidine  (prop/fentanyl  dc'd). Attempt SBT but  failed due to sever anxiety 11/08/24; No significant events overnight. Afebrile, hemodynamically stable, off Levophed .  On minimal vent support, plan for WUA and SBT utilizing Precedex .  EXTUBATED to HFNC, respiratory status remains tenuous. 11/09/24: Extubated yesterday, Patient on HHFNC 30% 45 l/m, Patient states breathing is fine. Some somewhat breathless when trying to speak. Rr upper 20s. Per nurse patient gets anxious and pulls at things. On 1 precedex . Code status changed to DNR (ok with intubation).  Passed swallow evaluation. 11/10/24: No acute events overnight.  Afebrile, not requiring Levophed .  Tolerated BiPaP at bedtime, remains on Precedex  (weaning down), respiratory status slowly improving, remains on HHFNC. 11/11/24: No acute events overnight, tolerated BiPAP with Precedex , requesting to go back to HFNC.  Attempt to wean off Precedex  today, will start Clonidine  to assist with weaning.  Transition IV to PO Valium .  Decrease solumedrol to 20 mg daily.  Diurese with 20 mg Lasix  x1 dose. Consult Palliative Care to assist with GOC discussions. Code status changed to DNR/DNI, goal to discharge home with hospice.  11/12/24: No acute events  overnight. Afebrile, hemodynamically stable.  Tolerated BiPAP overnight, remains on Precedex , now on HHFNC but respiratory status remains tenuous.  Diurese with 40 mg IV lasix  x1 dose.  Family wanting to take pt home with hospice.  11/13/24: Ate breakfast. Plan is home with hospice  11/14/24: No acute issues overnight. Awaiting palliative care plan regarding home hospice.  We spoke with husband at length regarding her care options.  Patient is already on an noninvasive vent at home.  Oral intake remains poor.  She still gets severely dyspneic with minimal exertion.  Overall she is at her baseline.  Patient is off Precedex . Tolerating morphine  and valium   11/15/24: No significant events overnight.  Afebrile, hemodynamically stable.  Plan for discharge Home with  Hospice today.   Interim History / Subjective:  As outlined above under Significant Hospital Events section  Objective   Blood pressure 125/63, pulse 92, temperature 98.1 F (36.7 C), resp. rate 16, height 5' 3 (1.6 m), weight 65.2 kg, SpO2 100%.    FiO2 (%):  [35 %-45 %] 40 % PEEP:  [5 cmH20] 5 cmH20 Pressure Support:  [6 cmH20] 6 cmH20   Intake/Output Summary (Last 24 hours) at 11/15/2024 0757 Last data filed at 11/15/2024 9370 Gross per 24 hour  Intake 120 ml  Output 650 ml  Net -530 ml   Filed Weights   11/12/24 0702 11/13/24 0500 11/14/24 0500  Weight: 64.7 kg 65.4 kg 65.2 kg    Examination: General: Acute on chronically ill appearing female, laying in bed, on 6L [place], in NAD HENT: Atraumatic, normocephalic, neck supple, no JVD Lungs: Diminished breath sounds throughout, even, family reports she is at her baseline effort Cardiovascular: Regular rate and rhythm, s1s2, no M/R/G Abdomen: Soft, nontender, nondistended, no guarding or rebound tenderness, BS+ x4 Extremities: Normal bulk and tone, no deformities, no edema, good peripheral perfusion Neuro: Awake and alert, no anxiety this morning, following commands, no focal deficits noted, PERRL GU: External catheter in place    Resolved Hospital Problem list     Assessment & Plan:   #Acute on Chronic Hypoxic & Hypercapnic Respiratory Failure #COPD Exacerbation #Chronic Atelectasis of RML PMHx: COPD on Breztri  and Dupilumab EXTUBATED 12/15 -Supplemental O2 as needed to maintain O2 sats 88 To 92% -BiPAP at bedtime and PRN  -Follow intermittent Chest X-ray & ABG as needed -Bronchodilators & Pulmicort  nebs -IV Steroids ~ Will transition to Prednisone  for discharge  -ABX as above ~ Zosyn  course to complete on 12/18 -Diuresis as BP and renal function permits ~ diuresed last with 40 mg IV Lasix  x1 dose 12/19 -Pulmonary toilet as able -Plan is for pt to go home on hospice.  She already has noninvasive ventilator at home    #Hypertension PMHx: HFpEF, HLD Echo 11/2023: Grade I diastolic dysfunction -Continuous cardiac monitoring -Maintain MAP >65 -Cautious IV fluids -Diuresis as BP and renal function permits -Continue Clonidine    #Anemia -Monitor for S/Sx of bleeding -Trend CBC -Lovenox  for VTE Prophylaxis  -Transfuse for Hgb <7  #Acute Metabolic Encephalopathy ~ RESOLVED  PMHx: Anxiety/depression -Treatment of metabolic derangements as outlined above -Provide supportive care -Promote normal sleep/wake cycle and family presence -Avoid sedating medications as able -Continue Precedex  as needed ~ weaned off 12/18 ~ continue Clonidine   -Continue Valium  to 5 mg q8h  -Continue morphine   5 mg q4h for air hunger  -Continue Wellbutrin  and Cymbalta         Best Practice (right click and Reselect all SmartList Selections daily)   Diet/type: Regular  DVT prophylaxis: LMWH GI prophylaxis: PPI Lines: N/A Foley:  N/A Code Status:  DNR/DNI Last date of multidisciplinary goals of care discussion [12/22]  12/22: Updated pt's husband and pt's sister at bedside on plan of care.  Labs   CBC: Recent Labs  Lab 11/11/24 0544 11/12/24 0345 11/13/24 0334 11/14/24 0343 11/15/24 0346  WBC 9.9 9.1 10.0 9.7 9.5  HGB 9.7* 10.2* 9.4* 9.6* 8.8*  HCT 31.8* 33.7* 30.9* 31.8* 29.5*  MCV 88.8 90.1 88.8 92.2 91.0  PLT 317 314 317 315 304    Basic Metabolic Panel: Recent Labs  Lab 11/09/24 0335 11/10/24 0509 11/11/24 0544 11/12/24 0345 11/13/24 0334 11/13/24 1234 11/14/24 0343 11/15/24 0346  NA 148* 149* 146* 145 143  --  143 140  K 3.9 3.8 3.9 3.5 2.6* 4.1 4.2 4.6  CL 110 113* 106 105 98  --  98 96*  CO2 29 29 32 33* 38*  --  38* 40*  GLUCOSE 153* 142* 102* 111* 83  --  94 104*  BUN 51* 52* 40* 36* 21  --  18 13  CREATININE 0.68 0.57 0.54 0.51 0.38*  --  0.37* 0.36*  CALCIUM 9.3 9.4 9.2 9.1 8.7*  --  9.2 8.8*  MG 2.5* 2.3 2.1 1.9 1.9  --  2.0 1.9  PHOS 3.5 3.2 3.5 3.5 2.6  --   --   --     GFR: Estimated Creatinine Clearance: 59.4 mL/min (A) (by C-G formula based on SCr of 0.36 mg/dL (L)). Recent Labs  Lab 11/12/24 0345 11/13/24 0334 11/14/24 0343 11/15/24 0346  WBC 9.1 10.0 9.7 9.5    Liver Function Tests: Recent Labs  Lab 11/11/24 0544  AST 23  ALT 32  ALKPHOS 87  BILITOT 0.3  PROT 5.8*  ALBUMIN 3.2*   No results for input(s): LIPASE, AMYLASE in the last 168 hours. No results for input(s): AMMONIA in the last 168 hours.  ABG    Component Value Date/Time   PHART 7.28 (L) 11/06/2024 0045   PCO2ART 69 (HH) 11/06/2024 0045   PO2ART 98 11/06/2024 0045   HCO3 32.4 (H) 11/06/2024 0045   O2SAT 98.8 11/06/2024 0045     Coagulation Profile: No results for input(s): INR, PROTIME in the last 168 hours.  Cardiac Enzymes: No results for input(s): CKTOTAL, CKMB, CKMBINDEX, TROPONINI in the last 168 hours.  HbA1C: Hemoglobin A1C  Date/Time Value Ref Range Status  12/11/2023 04:01 PM 6.2 (A) 4.0 - 5.6 % Final   Hgb A1c MFr Bld  Date/Time Value Ref Range Status  05/22/2018 12:02 AM 6.2 (H) 4.8 - 5.6 % Final    Comment:    (NOTE) Pre diabetes:          5.7%-6.4% Diabetes:              >6.4% Glycemic control for   <7.0% adults with diabetes     CBG: Recent Labs  Lab 11/14/24 1541 11/14/24 1932 11/14/24 2345 11/15/24 0412 11/15/24 0727  GLUCAP 165* 207* 101* 101* 104*    Review of Systems:   Positives in BOLD: Gen: Denies fever, chills, weight change, fatigue, night sweats, anxiety  HEENT: Denies blurred vision, double vision, hearing loss, tinnitus, sinus congestion, rhinorrhea, sore throat, neck stiffness, dysphagia PULM: Denies shortness of breath, cough, sputum production, hemoptysis, wheezing CV: Denies chest pain, edema, orthopnea, paroxysmal nocturnal dyspnea, palpitations GI: Denies abdominal pain, nausea, vomiting, diarrhea, hematochezia, melena, constipation, change in bowel habits GU: Denies dysuria, hematuria,  polyuria, oliguria, urethral  discharge Endocrine: Denies hot or cold intolerance, polyuria, polyphagia or appetite change Derm: Denies rash, dry skin, scaling or peeling skin change Heme: Denies easy bruising, bleeding, bleeding gums Neuro: Denies headache, numbness, weakness, slurred speech, loss of memory or consciousness   Past Medical History:  She,  has a past medical history of Bruises easily, Chronic respiratory failure with hypoxia (HCC), Cigarette smoker, Collapse of right lung, COPD (chronic obstructive pulmonary disease) (HCC), COVID-19 (07/2024), Dependence on supplemental oxygen , Depression with anxiety, Endometriosis, GERD (gastroesophageal reflux disease), Grade I diastolic dysfunction, History of sepsis, Hyperlipidemia, Hypertension (06/02/2012), Kidney stones, Pulmonary HTN (HCC), Reactive airway disease (03/08/2014), and Vitamin D  deficiency.   Surgical History:   Past Surgical History:  Procedure Laterality Date   BRONCHIAL WASHINGS N/A 09/17/2024   Procedure: IRRIGATION, BRONCHUS;  Surgeon: Parris Manna, MD;  Location: [facility] ORS;  Service: Thoracic;  Laterality: N/A;   CATARACT EXTRACTION W/PHACO Right 12/04/2023   Procedure: CATARACT EXTRACTION PHACO AND INTRAOCULAR LENS PLACEMENT (IOC) RIGHT  CLAREON VIVITY LENS;  Surgeon: Enola [name], MD;  Location: [facility];  Service: Ophthalmology;  Laterality: Right;  10.40 1:04.0   CATARACT EXTRACTION W/PHACO Left 12/18/2023   Procedure: CATARACT EXTRACTION PHACO AND INTRAOCULAR LENS PLACEMENT (IOC) LEFT  CLAREON VIVITY TORIC 16.28, 01:19.2;  Surgeon: Enola [name], MD;  Location: [facility];  Service: Ophthalmology;  Laterality: Left;   COLONOSCOPY WITH PROPOFOL  N/A 01/02/2023   Procedure: COLONOSCOPY WITH PROPOFOL ;  Surgeon: Unk Corinn Skiff, MD;  Location: [facility] END[facility];  Service: Gastroenterology;  Laterality: N/A;   ESOPHAGOGASTRODUODENOSCOPY N/A 07/29/2018   Procedure:  ESOPHAGOGASTRODUODENOSCOPY (EGD);  Surgeon: Toledo, Ladell POUR, MD;  Location: [facility] ENDOSCOPY;  Service: Gastroenterology;  Laterality: N/A;   ESOPHAGOGASTRODUODENOSCOPY (EGD) WITH PROPOFOL  N/A 01/02/2023   Procedure: ESOPHAGOGASTRODUODENOSCOPY (EGD) WITH PROPOFOL ;  Surgeon: Unk Corinn Skiff, MD;  Location: [facility] ENDOSCOPY;  Service: Gastroenterology;  Laterality: N/A;   FLEXIBLE BRONCHOSCOPY N/A 09/17/2024   Procedure: BRONCHOSCOPY, FLEXIBLE;  Surgeon: Parris Manna, MD;  Location: [facility] ORS;  Service: Thoracic;  Laterality: N/A;   IR RADIOLOGIST EVAL & MGMT  10/19/2024   KIDNEY STONE SURGERY     RIGHT HEART CATH Right 04/21/2024   Procedure: RIGHT HEART CATH;  Surgeon: Ammon Blunt, MD;  Location: [facility] INVASIVE CV LAB;  Service: Cardiovascular;  Laterality: Right;   TONSILLECTOMY       Social History:   reports that she quit smoking about 10 months ago. Her smoking use included cigarettes. She started smoking about 51 years ago. She has a 26.8 pack-year smoking history. She has never used smokeless tobacco. She reports current alcohol use. She reports that she does not use drugs.   Family History:  Her family history includes GER disease in her father and mother; Heart attack in her father; Hyperlipidemia in her mother; Hypertension in her father and mother; Parkinson's disease in her father.   Allergies Allergies[1]   Home Medications  Prior to Admission medications  Medication Sig Start Date End Date Taking? Authorizing Provider  ALPRAZolam  (XANAX ) 0.25 MG tablet Take 1 tablet (0.25 mg total) by mouth 3 (three) times daily as needed for anxiety. 12/11/23  Yes Abernathy, Mardy, NP  ascorbic acid  (VITAMIN C ) 500 MG tablet Take 500 mg by mouth daily.   Yes [provider]  aspirin  81 MG tablet Take 81 mg by mouth at bedtime.    Yes [provider]  azithromycin  (ZITHROMAX ) 250 MG tablet Take 250 mg by mouth every other day. 10/06/24 11/14/24 Yes [provider]  BREZTRI  AEROSPHERE 160-9-4.8 MCG/ACT AERO inhaler Inhale 2 puffs into the lungs 2 (two) times daily. 04/12/24  Yes [provider]  buPROPion  (WELLBUTRIN ) 75 MG tablet Take 75 mg by mouth 2 (two) times daily.   Yes [provider]  Cholecalciferol  25 MCG (1000 UT) tablet Take 1,000 Units by mouth daily.    Yes [provider]  Coenzyme Q10 (CO Q-10) 100 MG CAPS Take 100 mg by mouth daily.   Yes [provider]  DENTA 5000 PLUS 1.1 % CREA dental cream Take 1 application  by mouth 2 (two) times daily as needed. 04/14/21  Yes [provider]  dextromethorphan-guaiFENesin  (MUCINEX  DM) 30-600 MG 12hr tablet Take 1 tablet by mouth 2 (two) times daily as needed for cough. 10/02/24  Yes Caleen Qualia, MD  DULoxetine  (CYMBALTA ) 60 MG capsule Take 1 capsule (60 mg total) by mouth daily. 05/19/24  Yes Abernathy, Alyssa, NP  Dupilumab 300 MG/2ML SOAJ Inject 300 mg into the skin every 14 (fourteen) days. 01/27/24  Yes [provider]  EPINEPHrine  0.3 mg/0.3 mL IJ SOAJ injection Inject 0.3 mg into the muscle as needed. 01/27/24  Yes [provider]  famotidine  (PEPCID ) 40 MG tablet Take 1 tablet (40 mg total) by mouth 2 (two) times daily. 04/05/24  Yes Abernathy, Mardy, NP  fluticasone  (FLONASE ) 50 MCG/ACT nasal spray Place 2 sprays into both nostrils daily.   Yes [provider]  furosemide  (LASIX ) 40 MG tablet TAKE 1 TABLET BY MOUTH DAILY 04/12/24  Yes Abernathy, Alyssa, NP  gemfibrozil  (LOPID ) 600 MG tablet Take 1 tablet (600 mg total) by mouth 2 (two) times daily before a meal. 03/29/24  Yes Abernathy, Alyssa, NP  Ipratropium-Albuterol  (COMBIVENT  RESPIMAT) 20-100 MCG/ACT AERS respimat INHALE 1 PUFF INTO THE LUNGS EVERY 6 HOURS AS NEEDED FOR WHEEZING OR SHORTNESS OF BREATH 10/31/23  Yes Khan, Fozia M, MD  Magnesium  250 MG CAPS Take 250 mg by mouth daily.   Yes [provider]  metoprolol  tartrate (LOPRESSOR ) 25 MG tablet Take  0.5 tablets (12.5 mg total) by mouth 2 (two) times daily. 10/02/24  Yes Amin, Sumayya, MD  Multiple Vitamins-Minerals (MULTIVITAMIN WITH MINERALS) tablet Take 1 tablet by mouth daily. Centrum Silver 50+   Yes [provider]  Potassium Chloride  CR (MICRO-K ) 8 MEQ CPCR capsule CR Take 1 capsule (8 mEq total) by mouth daily. 10/26/23  Yes Khan, Fozia M, MD  predniSONE  (DELTASONE ) 5 MG tablet Take by mouth. Take 9 tablets (45 mg total) by mouth once daily for 5 days, THEN 8 tablets (40 mg total) once daily for 5 days, THEN 7 tablets (35 mg total) once daily for 5 days, THEN 6 tablets (30 mg total) once daily for 5 days, THEN 5 tablets (25 mg total) once daily for 5 days, THEN 4 tablets (20 mg total) once daily for 5 days, THEN 3 tablets (15 mg total) once daily for 5 days, THEN 2 tablets (10 mg total) once daily for 5 days, THEN 1 tablet (5 mg total) once daily for 5 days, THEN 0.5 tablets (2.5 mg total) once daily for 5 days. 10/06/24 11/25/24 Yes [provider]  sucralfate  (CARAFATE ) 1 g tablet Take 1 g by mouth 4 (four) times daily as needed (Stomach).   Yes [provider]  vitamin E  180 MG (400 UNITS) capsule Take 400 Units by mouth daily.   Yes [provider]  colchicine 0.6 MG tablet Take 0.6 mg by mouth daily. Patient not  taking: Reported on 11/04/2024 09/13/24   [provider]  feeding supplement (ENSURE PLUS HIGH PROTEIN) LIQD Take 237 mLs by mouth 2 (two) times daily between meals. 10/03/24   Amin, Sumayya, MD  OXYGEN  Inhale 2 L into the lungs continuous. Continuous oxygen  and NIV at night, 2 liters   pt uses [facility] for oxygen  supplies    [provider]     Critical care time: 40 minutes     Inge Lecher, AGACNP-BC [facility] Prefer epic messenger for cross cover needs If after hours, please call E-link           [1]  Allergies Allergen Reactions   Naproxen Rash and Other (See Comments)    Spouse  confirmed that pt breaks out in hives head to toe.    Sulfa Antibiotics Hives and Rash    Sulfa products.    "

## 2024-12-26 DEATH — deceased
